# Patient Record
Sex: Female | Born: 1970 | Race: Black or African American | Hispanic: No | Marital: Married | Smoking: Current every day smoker
Health system: Southern US, Community
[De-identification: ages and names within clinical notes are randomized; demographics above are authoritative.]

## PROBLEM LIST (undated history)

## (undated) DIAGNOSIS — M51369 Other intervertebral disc degeneration, lumbar region without mention of lumbar back pain or lower extremity pain: Secondary | ICD-10-CM

## (undated) DIAGNOSIS — G459 Transient cerebral ischemic attack, unspecified: Secondary | ICD-10-CM

## (undated) DIAGNOSIS — R55 Syncope and collapse: Secondary | ICD-10-CM

## (undated) DIAGNOSIS — R32 Unspecified urinary incontinence: Secondary | ICD-10-CM

## (undated) DIAGNOSIS — N3281 Overactive bladder: Secondary | ICD-10-CM

## (undated) DIAGNOSIS — F4310 Post-traumatic stress disorder, unspecified: Secondary | ICD-10-CM

## (undated) DIAGNOSIS — F419 Anxiety disorder, unspecified: Secondary | ICD-10-CM

## (undated) DIAGNOSIS — F1910 Other psychoactive substance abuse, uncomplicated: Secondary | ICD-10-CM

## (undated) DIAGNOSIS — M1990 Unspecified osteoarthritis, unspecified site: Secondary | ICD-10-CM

## (undated) DIAGNOSIS — D259 Leiomyoma of uterus, unspecified: Secondary | ICD-10-CM

## (undated) DIAGNOSIS — I10 Essential (primary) hypertension: Secondary | ICD-10-CM

## (undated) DIAGNOSIS — R9431 Abnormal electrocardiogram [ECG] [EKG]: Secondary | ICD-10-CM

## (undated) DIAGNOSIS — I219 Acute myocardial infarction, unspecified: Secondary | ICD-10-CM

## (undated) DIAGNOSIS — I6381 Other cerebral infarction due to occlusion or stenosis of small artery: Secondary | ICD-10-CM

## (undated) DIAGNOSIS — R569 Unspecified convulsions: Secondary | ICD-10-CM

## (undated) DIAGNOSIS — Z5189 Encounter for other specified aftercare: Secondary | ICD-10-CM

## (undated) DIAGNOSIS — F329 Major depressive disorder, single episode, unspecified: Secondary | ICD-10-CM

## (undated) DIAGNOSIS — R7303 Prediabetes: Secondary | ICD-10-CM

## (undated) DIAGNOSIS — Z7289 Other problems related to lifestyle: Secondary | ICD-10-CM

## (undated) DIAGNOSIS — E785 Hyperlipidemia, unspecified: Secondary | ICD-10-CM

## (undated) DIAGNOSIS — F32A Depression, unspecified: Secondary | ICD-10-CM

## (undated) DIAGNOSIS — R102 Pelvic and perineal pain: Secondary | ICD-10-CM

## (undated) DIAGNOSIS — R531 Weakness: Secondary | ICD-10-CM

## (undated) DIAGNOSIS — R413 Other amnesia: Secondary | ICD-10-CM

## (undated) DIAGNOSIS — L209 Atopic dermatitis, unspecified: Secondary | ICD-10-CM

## (undated) DIAGNOSIS — Z720 Tobacco use: Secondary | ICD-10-CM

## (undated) DIAGNOSIS — E119 Type 2 diabetes mellitus without complications: Secondary | ICD-10-CM

## (undated) DIAGNOSIS — I639 Cerebral infarction, unspecified: Secondary | ICD-10-CM

## (undated) HISTORY — DX: Overactive bladder: N32.81

## (undated) HISTORY — DX: Post-traumatic stress disorder, unspecified: F43.10

## (undated) HISTORY — DX: Leiomyoma of uterus, unspecified: D25.9

## (undated) HISTORY — DX: Atopic dermatitis, unspecified: L20.9

## (undated) HISTORY — DX: Weakness: R53.1

## (undated) HISTORY — DX: Tobacco use: Z72.0

## (undated) HISTORY — DX: Abnormal electrocardiogram (ECG) (EKG): R94.31

## (undated) HISTORY — DX: Other problems related to lifestyle: Z72.89

## (undated) HISTORY — DX: Other cerebral infarction due to occlusion or stenosis of small artery: I63.81

## (undated) HISTORY — DX: Pelvic and perineal pain: R10.2

## (undated) HISTORY — DX: Hyperlipidemia, unspecified: E78.5

## (undated) HISTORY — DX: Encounter for other specified aftercare: Z51.89

## (undated) HISTORY — DX: Anxiety disorder, unspecified: F41.9

## (undated) HISTORY — DX: Unspecified convulsions: R56.9

## (undated) HISTORY — DX: Unspecified urinary incontinence: R32

## (undated) HISTORY — DX: Major depressive disorder, single episode, unspecified: F32.9

## (undated) HISTORY — DX: Prediabetes: R73.03

## (undated) HISTORY — DX: Other intervertebral disc degeneration, lumbar region without mention of lumbar back pain or lower extremity pain: M51.369

## (undated) HISTORY — DX: Other psychoactive substance abuse, uncomplicated: F19.10

## (undated) HISTORY — DX: Syncope and collapse: R55

## (undated) HISTORY — DX: Depression, unspecified: F32.A

---

## 2014-11-22 ENCOUNTER — Emergency Department (HOSPITAL_COMMUNITY): Payer: Medicaid Other

## 2014-11-22 ENCOUNTER — Encounter (HOSPITAL_COMMUNITY): Payer: Self-pay | Admitting: Emergency Medicine

## 2014-11-22 ENCOUNTER — Emergency Department (HOSPITAL_COMMUNITY)
Admission: EM | Admit: 2014-11-22 | Discharge: 2014-11-22 | Disposition: A | Discharge status: Home / Self Care | Payer: Medicaid Other | Attending: Physician Assistant | Admitting: Physician Assistant

## 2014-11-22 DIAGNOSIS — R1031 Right lower quadrant pain: Secondary | ICD-10-CM | POA: Diagnosis present

## 2014-11-22 DIAGNOSIS — R1021 Pelvic and perineal pain right side: Secondary | ICD-10-CM

## 2014-11-22 DIAGNOSIS — Z79899 Other long term (current) drug therapy: Secondary | ICD-10-CM | POA: Insufficient documentation

## 2014-11-22 DIAGNOSIS — I10 Essential (primary) hypertension: Secondary | ICD-10-CM | POA: Insufficient documentation

## 2014-11-22 DIAGNOSIS — R102 Pelvic and perineal pain: Secondary | ICD-10-CM | POA: Insufficient documentation

## 2014-11-22 DIAGNOSIS — Z3202 Encounter for pregnancy test, result negative: Secondary | ICD-10-CM | POA: Insufficient documentation

## 2014-11-22 DIAGNOSIS — R110 Nausea: Secondary | ICD-10-CM | POA: Insufficient documentation

## 2014-11-22 HISTORY — DX: Essential (primary) hypertension: I10

## 2014-11-22 LAB — COMPREHENSIVE METABOLIC PANEL
ALT: 24 U/L (ref 14–54)
AST: 27 U/L (ref 15–41)
Albumin: 4 g/dL (ref 3.5–5.0)
Alkaline Phosphatase: 68 U/L (ref 38–126)
Anion gap: 8 (ref 5–15)
BUN: 8 mg/dL (ref 6–20)
CHLORIDE: 105 mmol/L (ref 101–111)
CO2: 23 mmol/L (ref 22–32)
CREATININE: 0.83 mg/dL (ref 0.44–1.00)
Calcium: 9.3 mg/dL (ref 8.9–10.3)
GFR calc Af Amer: >60 mL/min (ref >60)
GLUCOSE: 105 mg/dL — AB (ref 65–99)
Potassium: 3.8 mmol/L (ref 3.5–5.1)
Sodium: 136 mmol/L (ref 135–145)
Total Bilirubin: 0.5 mg/dL (ref 0.3–1.2)
Total Protein: 7.5 g/dL (ref 6.5–8.1)

## 2014-11-22 LAB — WET PREP, GENITAL
Trich, Wet Prep: NONE SEEN
Yeast Wet Prep HPF POC: NONE SEEN

## 2014-11-22 LAB — CBC
HCT: 43 % (ref 36.0–46.0)
Hemoglobin: 14.6 g/dL (ref 12.0–15.0)
MCH: 30.9 pg (ref 26.0–34.0)
MCHC: 34 g/dL (ref 30.0–36.0)
MCV: 91.1 fL (ref 78.0–100.0)
PLATELETS: 273 10*3/uL (ref 150–400)
RBC: 4.72 MIL/uL (ref 3.87–5.11)
RDW: 13.5 % (ref 11.5–15.5)
WBC: 9.7 10*3/uL (ref 4.0–10.5)

## 2014-11-22 LAB — URINALYSIS, ROUTINE W REFLEX MICROSCOPIC
BILIRUBIN URINE: NEGATIVE
GLUCOSE, UA: NEGATIVE mg/dL
HGB URINE DIPSTICK: NEGATIVE
KETONES UR: NEGATIVE mg/dL
Leukocytes, UA: NEGATIVE
NITRITE: NEGATIVE
PH: 6 (ref 5.0–8.0)
Protein, ur: NEGATIVE mg/dL
Specific Gravity, Urine: 1.01 (ref 1.005–1.030)
Urobilinogen, UA: 0.2 mg/dL (ref 0.0–1.0)

## 2014-11-22 LAB — GC/CHLAMYDIA PROBE AMP (~~LOC~~) NOT AT ARMC
CHLAMYDIA, DNA PROBE: NEGATIVE
Neisseria Gonorrhea: NEGATIVE

## 2014-11-22 LAB — LIPASE, BLOOD: LIPASE: 39 U/L (ref 11–51)

## 2014-11-22 LAB — I-STAT BETA HCG BLOOD, ED (MC, WL, AP ONLY): I-stat hCG, quantitative: <5 m[IU]/mL (ref <5)

## 2014-11-22 LAB — HIV ANTIBODY (ROUTINE TESTING W REFLEX): HIV SCREEN 4TH GENERATION: NONREACTIVE

## 2014-11-22 MED ORDER — KETOROLAC TROMETHAMINE 30 MG/ML IJ SOLN
30.0000 mg | Freq: Once | INTRAMUSCULAR | Status: AC
  Administered 2014-11-22: 30 mg via INTRAVENOUS
  Filled 2014-11-22: qty 1

## 2014-11-22 MED ORDER — IBUPROFEN 800 MG PO TABS: 800.0000 mg | ORAL_TABLET | Freq: Three times a day (TID) | ORAL | Status: DC

## 2014-11-22 NOTE — ED Notes (Signed)
Pt aware of need for a urine sample. Female urinal provided.

## 2014-11-22 NOTE — ED Notes (Signed)
Pt transported to [place] will medicate patient's pain upon arrival back to department.

## 2014-11-22 NOTE — ED Provider Notes (Signed)
CSN: [id number]     Arrival date & time 11/22/14  0115 History   By signing my name below, I, Evelene Croon, attest that this documentation has been prepared under the direction and in the presence of Morene Cecilio Julio Alm, MD . Electronically Signed: Evelene Croon, Scribe. 11/22/2014. 1:50 AM.   Chief Complaint  Patient presents with  . Abdominal Pain   The history is provided by the patient. No language interpreter was used.    HPI Comments:  Denise [name] is a 44 y.o. female who presents to the Emergency Department complaining of moderate right lower quadrant abdominal pain for a few months (since ~ July 2016) that has recently worsened. She reports associated nausea. Pt denies vomiting, diarrhea, constipation, vaginal discharge and fever.  No alleviating factors noted. Her LNMP was the second week of September 2016; notes her period has been irregular for the last year.  Past Medical History  Diagnosis Date  . Hypertension    History reviewed. No pertinent past surgical history. No family history on file. Social History  Substance Use Topics  . Smoking status: Never Smoker   . Smokeless tobacco: None  . Alcohol Use: No   OB History    No data available     Review of Systems  Constitutional: Negative for fever and chills.  Respiratory: Negative for cough.   Gastrointestinal: Positive for nausea and abdominal pain. Negative for vomiting.  All other systems reviewed and are negative.  Allergies  Review of patient's allergies indicates no known allergies.  Home Medications   Prior to Admission medications   Medication Sig Start Date End Date Taking? Authorizing Provider  Dextromethorphan HBr (VICKS DAYQUIL COUGH) 15 MG/15ML LIQD Take 15 mLs by mouth every 6 (six) hours as needed (cough).   Yes Historical Provider, MD  ibuprofen (ADVIL,MOTRIN) 200 MG tablet Take 200 mg by mouth every 6 (six) hours as needed for moderate pain.   Yes Historical Provider, MD   lisinopril (PRINIVIL,ZESTRIL) 20 MG tablet Take 20 mg by mouth 2 (two) times daily.   Yes Historical Provider, MD  SIMVASTATIN PO Take 1 tablet by mouth daily.   Yes Historical Provider, MD   BP 163/95 mmHg  Pulse 65  Temp(Src) 97.4 F (36.3 C) (Oral)  Resp 20  SpO2 99%  LMP 10/09/2014 Physical Exam  Constitutional: She is oriented to person, place, and time. She appears well-developed and well-nourished. No distress.  HENT:  Head: Normocephalic and atraumatic.  Eyes: Conjunctivae are normal.  Cardiovascular: Normal rate.   Pulmonary/Chest: Effort normal.  Abdominal: She exhibits no distension.  Genitourinary:  Chaperone Investment banker, corporate) was present for exam which was performed with no discomfort or complications.    Neurological: She is alert and oriented to person, place, and time.  Skin: Skin is warm and dry.  Psychiatric: She has a normal mood and affect.  Nursing note and vitals reviewed.   ED Course  Procedures  DIAGNOSTIC STUDIES:  Oxygen Saturation is 98% on RA, normal by my interpretation.    COORDINATION OF CARE:  1:36 AM Will order pain meds, pelvi [place] and bloodwork.  Discussed treatment plan with pt at bedside and pt agreed to plan.  Labs Review Labs Reviewed  WET PREP, GENITAL - Abnormal; Notable for the following:    Clue Cells Wet Prep HPF POC FEW (*)    WBC, Wet Prep HPF POC FEW (*)    All other components within normal limits  COMPREHENSIVE METABOLIC PANEL - Abnormal; Notable for the following:  Glucose, Bld 105 (*)    All other components within normal limits  URINALYSIS, ROUTINE W REFLEX MICROSCOPIC (NOT AT [facility]) - Abnormal; Notable for the following:    APPearance CLOUDY (*)    All other components within normal limits  LIPASE, BLOOD  CBC  RPR  HIV ANTIBODY (ROUTINE TESTING)  I-STAT BETA HCG BLOOD, ED ([facility], [facility], [facility] ONLY)  GC/CHLAMYDIA PROBE AMP ([place]) NOT AT [facility]    Imaging Review US Transvaginal Non-ob  11/22/2014  CLINICAL DATA:  RIGHT  adnexal tenderness since July 8. EXAM: TRANSABDOMINAL AND TRANSVAGINAL ULTRASOUND OF PELVIS TECHNIQUE: Both transabdominal and transvaginal ultrasound examinations of the pelvis were performed. Transabdominal technique was performed for global imaging of the pelvis including uterus, ovaries, adnexal regions, and pelvic cul-de-sac. It was necessary to proceed with endovaginal exam following the transabdominal exam to visualize the ovaries. COMPARISON:  None FINDINGS: Limited evaluation, due to positioning of the uterus. Uterus Measurements: Or 8.6 x 8.6 x 5.1 cm. Exophytic 4.7 x 5.4 x 4.3 cm hypoechoic uterine fundal leiomyoma. Additional calcified 3.2 x 2.2 x 2.2 cm exophytic leiomyoma arising from the uterine fundus. Endometrium Thickness: 8.5 mm.  No focal abnormality visualized. Right ovary Not sonographically identified Left ovary Measurements: 2 x 1.5 x 1.1 cm. Normal appearance/no adnexal mass. Other findings Trace free fluid in the pelvis. IMPRESSION: Limited pelvic sonogram due to positioning of uterus, RIGHT ovary not sonographically identified. Two subserosal uterine fundal leiomyomas measure up to 5.4 cm still be better characterized with MRI of the pelvis on a nonemergent basis as clinically indicated. Electronically Signed   By: Elon Alas M.D.   On: 11/22/2014 02:51   US Pelvis Complete  11/22/2014  CLINICAL DATA:  RIGHT adnexal tenderness since July 8. EXAM: TRANSABDOMINAL AND TRANSVAGINAL ULTRASOUND OF PELVIS TECHNIQUE: Both transabdominal and transvaginal ultrasound examinations of the pelvis were performed. Transabdominal technique was performed for global imaging of the pelvis including uterus, ovaries, adnexal regions, and pelvic cul-de-sac. It was necessary to proceed with endovaginal exam following the transabdominal exam to visualize the ovaries. COMPARISON:  None FINDINGS: Limited evaluation, due to positioning of the uterus. Uterus Measurements: Or 8.6 x 8.6 x 5.1 cm. Exophytic  4.7 x 5.4 x 4.3 cm hypoechoic uterine fundal leiomyoma. Additional calcified 3.2 x 2.2 x 2.2 cm exophytic leiomyoma arising from the uterine fundus. Endometrium Thickness: 8.5 mm.  No focal abnormality visualized. Right ovary Not sonographically identified Left ovary Measurements: 2 x 1.5 x 1.1 cm. Normal appearance/no adnexal mass. Other findings Trace free fluid in the pelvis. IMPRESSION: Limited pelvic sonogram due to positioning of uterus, RIGHT ovary not sonographically identified. Two subserosal uterine fundal leiomyomas measure up to 5.4 cm still be better characterized with MRI of the pelvis on a nonemergent basis as clinically indicated. Electronically Signed   By: Elon Alas M.D.   On: 11/22/2014 02:51   I have personally reviewed and evaluated these images and lab results as part of my medical decision-making.   EKG Interpretation None      MDM   Final diagnoses:  Right adnexal tenderness    Patient is a very pleasant 44 year old morbidly obese female stenting with right lower quadrant pain/adnexal pain for the past few months. Patient unable to tell a good history about what makes it better or worse. Patient's been having irregular periods. She reportedly was going to get somefurther imaging done by her PCP but has not been scheduled for yet. Patient noted  that the pain became acutely worse  this evening. SHe appears in NAD on exam. No fever. No nausea no vomiting no diarrhea.  Given the adnexal lcoation of the pain in this patient. We will do vaginal exam and transvaginal ultrasound. Concern for t cyst versus dyfunctional uterine bleeding.   I personally performed the services described in this documentation, which was scribed in my presence. The recorded information has been reviewed and is accurate.   3:26 AM Vaginal exam normal. No cerclage tenderness. No discharge. Ultrasound shows fibroids. Patient's pain is resolved. Given the length of time the pain has been going  on, several months, I doubt any acute pathology such as torsion. I think that fibroid could be the answer to why the patient is having multiple months of pain.  We'll give her ibuprofen and have her follow-up with her primary care physician and obgyn.  Debarah Mccumbers Julio Alm, MD 11/22/14 [phone]

## 2014-11-22 NOTE — ED Notes (Addendum)
Pt BIB [facility] c/o RLQ abdominal pain worse with palpation. Has seen PCP for same but was never called to confirm testing appointment. Denies urinary symptoms or nausea, vomiting, or diarrhea.

## 2014-11-22 NOTE — ED Notes (Signed)
Bed: WA09 Expected date:  Expected time:  Means of arrival:  Comments: EMS F Rt lower quadrant pain

## 2014-11-22 NOTE — ED Notes (Signed)
MD at bedside. 

## 2014-11-22 NOTE — Discharge Instructions (Signed)
You are found to have fibroids today. We think that's what causing your months of pain. If you have any increase in pain, sharp pain, fever, or any other concerns please return. We will continue a referral for OB/GYN please feel free to follow up with them.

## 2014-11-26 LAB — RPR, QUANT+TP ABS (REFLEX)
Rapid Plasma Reagin, Quant: 1:2 {titer} — ABNORMAL HIGH
TREPONEMA PALLIDUM AB: POSITIVE — AB

## 2014-11-26 LAB — RPR: RPR Ser Ql: REACTIVE — AB

## 2014-11-27 ENCOUNTER — Telehealth (HOSPITAL_BASED_OUTPATIENT_CLINIC_OR_DEPARTMENT_OTHER): Payer: Self-pay | Admitting: Emergency Medicine

## 2014-11-27 NOTE — Telephone Encounter (Signed)
Chart handoff to EDP for treatnment plan +RPR

## 2014-11-30 ENCOUNTER — Telehealth (HOSPITAL_COMMUNITY): Payer: Self-pay

## 2014-12-01 ENCOUNTER — Telehealth (HOSPITAL_COMMUNITY): Payer: Self-pay

## 2014-12-02 ENCOUNTER — Telehealth (HOSPITAL_COMMUNITY): Payer: Self-pay

## 2014-12-02 NOTE — Telephone Encounter (Signed)
Unable to reach by telephone. Letter sent to address on record.  

## 2014-12-18 ENCOUNTER — Telehealth (HOSPITAL_COMMUNITY): Payer: Self-pay

## 2014-12-18 ENCOUNTER — Encounter: Payer: Self-pay | Admitting: Obstetrics & Gynecology

## 2014-12-18 ENCOUNTER — Other Ambulatory Visit (HOSPITAL_COMMUNITY)
Admission: RE | Admit: 2014-12-18 | Discharge: 2014-12-18 | Disposition: A | Discharge status: Home / Self Care | Payer: Medicaid Other | Source: Ambulatory Visit | Attending: Obstetrics & Gynecology | Admitting: Obstetrics & Gynecology

## 2014-12-18 ENCOUNTER — Ambulatory Visit (INDEPENDENT_AMBULATORY_CARE_PROVIDER_SITE_OTHER): Payer: Medicaid Other | Admitting: Obstetrics & Gynecology

## 2014-12-18 VITALS — BP 184/98 | HR 91 | Temp 98.4°F | Ht 64.0 in | Wt 248.6 lb

## 2014-12-18 DIAGNOSIS — Z01419 Encounter for gynecological examination (general) (routine) without abnormal findings: Secondary | ICD-10-CM

## 2014-12-18 DIAGNOSIS — D259 Leiomyoma of uterus, unspecified: Secondary | ICD-10-CM

## 2014-12-18 DIAGNOSIS — Z1151 Encounter for screening for human papillomavirus (HPV): Secondary | ICD-10-CM | POA: Insufficient documentation

## 2014-12-18 DIAGNOSIS — R102 Pelvic and perineal pain: Secondary | ICD-10-CM

## 2014-12-18 DIAGNOSIS — Z01411 Encounter for gynecological examination (general) (routine) with abnormal findings: Secondary | ICD-10-CM | POA: Diagnosis present

## 2014-12-18 DIAGNOSIS — I10 Essential (primary) hypertension: Secondary | ICD-10-CM

## 2014-12-18 DIAGNOSIS — Z124 Encounter for screening for malignant neoplasm of cervix: Secondary | ICD-10-CM

## 2014-12-18 HISTORY — DX: Leiomyoma of uterus, unspecified: D25.9

## 2014-12-18 HISTORY — DX: Pelvic and perineal pain: R10.2

## 2014-12-18 HISTORY — DX: Essential (primary) hypertension: I10

## 2014-12-18 MED ORDER — TRAMADOL-ACETAMINOPHEN 37.5-325 MG PO TABS: 1.0000 | ORAL_TABLET | Freq: Four times a day (QID) | ORAL | Status: DC | PRN

## 2014-12-18 NOTE — Patient Instructions (Signed)

## 2014-12-18 NOTE — Telephone Encounter (Signed)
Unable to reach by phone or mail.  Chart closed.   

## 2014-12-18 NOTE — Progress Notes (Signed)
Patient ID: Denise [name], female   DOB: 30-Aug-1970, 44 y.o.   MRN: [id number]  Chief Complaint  Patient presents with  . Fibroids  referred for pelvic pain. Denise [name] showed fibroids  HPI Denise [name] is a 44 y.o. female.  G3P3 Patient's last menstrual period was 12/08/2014 (exact date). Menses every 2 months and last for 3 days, not heavy. Daily abd and pelvic pain with urinary urge, frequency and pain.  HPI  Past Medical History  Diagnosis Date  . Hypertension     No past surgical history on file.  No family history on file.  Social History Social History  Substance Use Topics  . Smoking status: Current Every Day Smoker  . Smokeless tobacco: Never Used  . Alcohol Use: No    No Known Allergies  Current Outpatient Prescriptions  Medication Sig Dispense Refill  . Dextromethorphan HBr (VICKS DAYQUIL COUGH) 15 MG/15ML LIQD Take 15 mLs by mouth every 6 (six) hours as needed (cough).    Marland Kitchen ibuprofen (ADVIL,MOTRIN) 200 MG tablet Take 200 mg by mouth every 6 (six) hours as needed for moderate pain.    Marland Kitchen ibuprofen (ADVIL,MOTRIN) 800 MG tablet Take 1 tablet (800 mg total) by mouth 3 (three) times daily. 21 tablet 0  . lisinopril (PRINIVIL,ZESTRIL) 20 MG tablet Take 20 mg by mouth 2 (two) times daily.    Marland Kitchen SIMVASTATIN PO Take 1 tablet by mouth daily.     No current facility-administered medications for this visit.    Review of Systems Review of Systems  Constitutional: Negative.   Gastrointestinal: Positive for abdominal distention (and gas). Negative for diarrhea and constipation.  Genitourinary: Positive for dysuria, urgency, frequency, menstrual problem (mild cramps) and pelvic pain. Negative for hematuria, vaginal bleeding and vaginal discharge.  Musculoskeletal: Negative.     Blood pressure 184/98, pulse 91, temperature 98.4 F (36.9 C), height 5\' 4"  (1.626 m), weight 248 lb 9.6 oz (112.764 kg), last menstrual period 12/08/2014.  Physical Exam Physical  Exam  Constitutional: She is oriented to person, place, and time. She appears well-developed. No distress.  obese  Cardiovascular: Normal rate.   Pulmonary/Chest: Effort normal. No respiratory distress.  Genitourinary: Vagina normal and uterus normal. No vaginal discharge found.  No mass palpated, pap done. S/P tenderness  Neurological: She is alert and oriented to person, place, and time.  Skin: Skin is warm and dry.  Psychiatric: She has a normal mood and affect. Her behavior is normal.    Data Reviewed CLINICAL DATA: RIGHT adnexal tenderness since July 8.  EXAM: TRANSABDOMINAL AND TRANSVAGINAL ULTRASOUND OF PELVIS  TECHNIQUE: Both transabdominal and transvaginal ultrasound examinations of the pelvis were performed. Transabdominal technique was performed for global imaging of the pelvis including uterus, ovaries, adnexal regions, and pelvic cul-de-sac. It was necessary to proceed with endovaginal exam following the transabdominal exam to visualize the ovaries.  COMPARISON: None  FINDINGS: Limited evaluation, due to positioning of the uterus.  Uterus  Measurements: Or 8.6 x 8.6 x 5.1 cm. Exophytic 4.7 x 5.4 x 4.3 cm hypoechoic uterine fundal leiomyoma. Additional calcified 3.2 x 2.2 x 2.2 cm exophytic leiomyoma arising from the uterine fundus.  Endometrium  Thickness: 8.5 mm. No focal abnormality visualized.  Right ovary  Not sonographically identified  Left ovary  Measurements: 2 x 1.5 x 1.1 cm. Normal appearance/no adnexal mass.  Other findings  Trace free fluid in the pelvis.  IMPRESSION: Limited pelvic sonogram due to positioning of uterus, RIGHT ovary not sonographically identified.  Two subserosal uterine fundal  leiomyomas measure up to 5.4 cm still be better characterized with MRI of the pelvis on a nonemergent basis as clinically indicated.   Electronically Signed  By: Elon Alas M.D.  On: 11/22/2014 02:51       Result History     Denise [name] Transvaginal Non-OB (Order [id number]) on 11/22/2014 - Order Result History Report          Vitals     Height Weight BMI (Calculated)            Interpretation Summary     CLINICAL DATA: RIGHT adnexal tenderness since July 8.  EXAM: TRANSABDOMINAL AND TRANSVAGINAL ULTRASOUND OF PELVIS  TECHNIQUE: Both transabdominal and transvaginal ultrasound examinations of the pelvis were performed. Transabdominal technique was performed for global imaging of the pelvis including uterus, ovaries, adnexal regions, and pelvic cul-de-sac. It was necessary to proceed with endovaginal exam following the transabdominal exam to visualize the ovaries.  COMPARISON: None  FINDINGS: Limited evaluation, due to positioning of the uterus.  Uterus  Measurements: Or 8.6 x 8.6 x 5.1 cm. Exophytic 4.7 x 5.4 x 4.3 cm hypoechoic uterine fundal leiomyoma. Additional calcified 3.2 x 2.2 x 2.2 cm exophytic leiomyoma arising from the uterine fundus.  Endometrium  Thickness: 8.5 mm. No focal abnormality visualized.  Right ovary  Not sonographically identified  Left ovary  Measurements: 2 x 1.5 x 1.1 cm. Normal appearance/no adnexal mass.  Other findings  Trace free fluid in the pelvis.  IMPRESSION: Limited pelvic sonogram due to positioning of uterus, RIGHT ovary not sonographically identified.  Two subserosal uterine fundal leiomyomas measure up to 5.4 cm still be better characterized with MRI of the pelvis on a nonemergent basis as clinically indicated.   Electronically Signed  By: Elon Alas M.D.  On: 11/22/2014 02:51      Assessment    Exophytic fibroids, no significant menstrual problems Pelvic pain with associated GI and urinary symptoms2   Hypertension poor control  Plan    Ultracet prn no refill Urology referral for possible IC RTC 6 weeks    PCP f/u for HTN    Radley Teston 12/18/2014, 3:10  PM

## 2014-12-23 ENCOUNTER — Telehealth: Payer: Self-pay | Admitting: General Practice

## 2014-12-23 LAB — CYTOLOGY - PAP

## 2014-12-23 NOTE — Telephone Encounter (Signed)
Patient needed referral to urology. appt scheduled with [facility] 1/11 @ 230. Called patient and informed her of appt and [facility]'s information. Patient verbalized understanding to all & had no questions

## 2015-01-26 DIAGNOSIS — I639 Cerebral infarction, unspecified: Secondary | ICD-10-CM | POA: Insufficient documentation

## 2015-01-26 HISTORY — DX: Cerebral infarction, unspecified: I63.9

## 2015-02-06 ENCOUNTER — Encounter: Payer: Self-pay | Admitting: Gastroenterology

## 2015-02-07 ENCOUNTER — Observation Stay (HOSPITAL_COMMUNITY): Payer: Medicaid Other

## 2015-02-07 ENCOUNTER — Observation Stay (HOSPITAL_COMMUNITY)
Admission: EM | Admit: 2015-02-07 | Discharge: 2015-02-09 | Disposition: A | Discharge status: Home / Self Care | Payer: Medicaid Other | Attending: Student in an Organized Health Care Education/Training Program | Admitting: Student in an Organized Health Care Education/Training Program

## 2015-02-07 ENCOUNTER — Encounter (HOSPITAL_COMMUNITY): Payer: Self-pay | Admitting: Emergency Medicine

## 2015-02-07 ENCOUNTER — Emergency Department (HOSPITAL_COMMUNITY): Payer: Medicaid Other

## 2015-02-07 DIAGNOSIS — R350 Frequency of micturition: Secondary | ICD-10-CM | POA: Diagnosis not present

## 2015-02-07 DIAGNOSIS — R1110 Vomiting, unspecified: Secondary | ICD-10-CM | POA: Diagnosis not present

## 2015-02-07 DIAGNOSIS — R51 Headache: Secondary | ICD-10-CM | POA: Diagnosis not present

## 2015-02-07 DIAGNOSIS — G8191 Hemiplegia, unspecified affecting right dominant side: Secondary | ICD-10-CM | POA: Diagnosis not present

## 2015-02-07 DIAGNOSIS — R531 Weakness: Secondary | ICD-10-CM | POA: Insufficient documentation

## 2015-02-07 DIAGNOSIS — Z3202 Encounter for pregnancy test, result negative: Secondary | ICD-10-CM | POA: Diagnosis not present

## 2015-02-07 DIAGNOSIS — Z79899 Other long term (current) drug therapy: Secondary | ICD-10-CM | POA: Insufficient documentation

## 2015-02-07 DIAGNOSIS — F17210 Nicotine dependence, cigarettes, uncomplicated: Secondary | ICD-10-CM | POA: Diagnosis not present

## 2015-02-07 DIAGNOSIS — Z823 Family history of stroke: Secondary | ICD-10-CM | POA: Diagnosis not present

## 2015-02-07 DIAGNOSIS — R1030 Lower abdominal pain, unspecified: Secondary | ICD-10-CM | POA: Insufficient documentation

## 2015-02-07 DIAGNOSIS — I10 Essential (primary) hypertension: Secondary | ICD-10-CM | POA: Diagnosis not present

## 2015-02-07 DIAGNOSIS — Z86018 Personal history of other benign neoplasm: Secondary | ICD-10-CM | POA: Diagnosis not present

## 2015-02-07 DIAGNOSIS — I639 Cerebral infarction, unspecified: Secondary | ICD-10-CM | POA: Diagnosis not present

## 2015-02-07 DIAGNOSIS — R55 Syncope and collapse: Secondary | ICD-10-CM | POA: Diagnosis not present

## 2015-02-07 DIAGNOSIS — Z791 Long term (current) use of non-steroidal anti-inflammatories (NSAID): Secondary | ICD-10-CM | POA: Insufficient documentation

## 2015-02-07 DIAGNOSIS — A523 Neurosyphilis, unspecified: Secondary | ICD-10-CM

## 2015-02-07 HISTORY — DX: Syncope and collapse: R55

## 2015-02-07 LAB — URINALYSIS, ROUTINE W REFLEX MICROSCOPIC
Bilirubin Urine: NEGATIVE
Glucose, UA: NEGATIVE mg/dL
HGB URINE DIPSTICK: NEGATIVE
Ketones, ur: NEGATIVE mg/dL
LEUKOCYTES UA: NEGATIVE
Nitrite: NEGATIVE
Protein, ur: NEGATIVE mg/dL
SPECIFIC GRAVITY, URINE: 1.019 (ref 1.005–1.030)
pH: 5.5 (ref 5.0–8.0)

## 2015-02-07 LAB — BASIC METABOLIC PANEL
ANION GAP: 11 (ref 5–15)
BUN: <5 mg/dL — ABNORMAL LOW (ref 6–20)
CALCIUM: 9.3 mg/dL (ref 8.9–10.3)
CO2: 22 mmol/L (ref 22–32)
Chloride: 105 mmol/L (ref 101–111)
Creatinine, Ser: 0.73 mg/dL (ref 0.44–1.00)
GLUCOSE: 106 mg/dL — AB (ref 65–99)
Potassium: 4 mmol/L (ref 3.5–5.1)
SODIUM: 138 mmol/L (ref 135–145)

## 2015-02-07 LAB — CBC
HCT: 44 % (ref 36.0–46.0)
HEMOGLOBIN: 15 g/dL (ref 12.0–15.0)
MCH: 31.2 pg (ref 26.0–34.0)
MCHC: 34.1 g/dL (ref 30.0–36.0)
MCV: 91.5 fL (ref 78.0–100.0)
Platelets: 300 10*3/uL (ref 150–400)
RBC: 4.81 MIL/uL (ref 3.87–5.11)
RDW: 14.3 % (ref 11.5–15.5)
WBC: 9.8 10*3/uL (ref 4.0–10.5)

## 2015-02-07 LAB — TROPONIN I
Troponin I: <0.03 ng/mL (ref <0.031)
Troponin I: <0.03 ng/mL (ref <0.031)

## 2015-02-07 LAB — CBG MONITORING, ED: Glucose-Capillary: 100 mg/dL — ABNORMAL HIGH (ref 65–99)

## 2015-02-07 LAB — RAPID URINE DRUG SCREEN, HOSP PERFORMED
Amphetamines: NOT DETECTED
BENZODIAZEPINES: NOT DETECTED
Barbiturates: NOT DETECTED
Cocaine: POSITIVE — AB
Opiates: NOT DETECTED
Tetrahydrocannabinol: NOT DETECTED

## 2015-02-07 LAB — TSH: TSH: 2.587 u[IU]/mL (ref 0.350–4.500)

## 2015-02-07 LAB — I-STAT BETA HCG BLOOD, ED (MC, WL, AP ONLY): I-stat hCG, quantitative: <5 m[IU]/mL (ref <5)

## 2015-02-07 LAB — ETHANOL

## 2015-02-07 MED ORDER — METOCLOPRAMIDE HCL 5 MG/ML IJ SOLN
10.0000 mg | Freq: Once | INTRAMUSCULAR | Status: AC
  Administered 2015-02-07: 10 mg via INTRAVENOUS
  Filled 2015-02-07: qty 2

## 2015-02-07 MED ORDER — HEPARIN SODIUM (PORCINE) 5000 UNIT/ML IJ SOLN
5000.0000 [IU] | Freq: Three times a day (TID) | INTRAMUSCULAR | Status: DC
  Administered 2015-02-07 – 2015-02-08 (×2): 5000 [IU] via SUBCUTANEOUS
  Filled 2015-02-07 (×2): qty 1

## 2015-02-07 MED ORDER — VITAMIN B-1 100 MG PO TABS
100.0000 mg | ORAL_TABLET | Freq: Every day | ORAL | Status: DC
  Administered 2015-02-07 – 2015-02-09 (×3): 100 mg via ORAL
  Filled 2015-02-07 (×3): qty 1

## 2015-02-07 MED ORDER — LORAZEPAM 1 MG PO TABS: 1.0000 mg | ORAL_TABLET | Freq: Four times a day (QID) | ORAL | Status: DC | PRN

## 2015-02-07 MED ORDER — LORAZEPAM 2 MG/ML IJ SOLN: 1.0000 mg | Freq: Four times a day (QID) | INTRAMUSCULAR | Status: DC | PRN

## 2015-02-07 MED ORDER — ASPIRIN EC 325 MG PO TBEC
325.0000 mg | DELAYED_RELEASE_TABLET | Freq: Once | ORAL | Status: AC
  Administered 2015-02-07: 325 mg via ORAL
  Filled 2015-02-07: qty 1

## 2015-02-07 MED ORDER — THIAMINE HCL 100 MG/ML IJ SOLN: 100.0000 mg | Freq: Every day | INTRAMUSCULAR | Status: DC

## 2015-02-07 MED ORDER — OXYCODONE-ACETAMINOPHEN 5-325 MG PO TABS
2.0000 | ORAL_TABLET | Freq: Once | ORAL | Status: AC
  Administered 2015-02-07: 2 via ORAL
  Filled 2015-02-07: qty 2

## 2015-02-07 MED ORDER — ADULT MULTIVITAMIN W/MINERALS CH
1.0000 | ORAL_TABLET | Freq: Every day | ORAL | Status: DC
  Administered 2015-02-07 – 2015-02-09 (×3): 1 via ORAL
  Filled 2015-02-07 (×3): qty 1

## 2015-02-07 MED ORDER — FOLIC ACID 1 MG PO TABS
1.0000 mg | ORAL_TABLET | Freq: Every day | ORAL | Status: DC
  Administered 2015-02-07 – 2015-02-09 (×3): 1 mg via ORAL
  Filled 2015-02-07 (×3): qty 1

## 2015-02-07 MED ORDER — LISINOPRIL 20 MG PO TABS: 20.0000 mg | ORAL_TABLET | Freq: Two times a day (BID) | ORAL | Status: DC

## 2015-02-07 NOTE — ED Provider Notes (Signed)
CSN: [id number]     Arrival date & time 02/07/15  1329 History   First MD Initiated Contact with Patient 02/07/15 1331     Chief Complaint  Patient presents with  . Loss of Consciousness   (Consider location/radiation/quality/duration/timing/severity/associated sxs/prior Treatment) The history is provided by the patient, the spouse and a friend. No language interpreter was used.   Denise [name] is a 45 y.o female with a past medical history of hypertension who presents from home via EMS for syncopal episode that occurred prior to arrival and lasted less than a minute. Her husband reports that he was sleeping on the couch of the time and heard her scream out to him before she passed out. He does not know if she hit her head on the wooden floor. She is complaining of a headache and abdominal pain which her husband states is chronic for her and that she has been suffering from for several years. She says the headache is in the parietal and occipital region and is reproducible. She also reports that it occurs every morning. She also had one episode of vomiting without blood 1 week ago. She was recently seen women's outpatient clinic and diagnosed with uterine fibroids. She was given a referral to the gastroenterologist for abdominal pain. She denies taking any medications except for lisinopril 20mg  BID for her blood pressure. Husband reports that she passes out all the time but has never been evaluated for headaches or seen by a neurologist. She denies any fever, chills, chest pain, shortness of breath, nausea, diarrhea, constipation, hematochezia. Her last bowel movement was this morning. Last menstrual period was 12/08/2014.  Past Medical History  Diagnosis Date  . Hypertension    Past Surgical History  Procedure Laterality Date  . Cesarean section     No family history on file. Social History  Substance Use Topics  . Smoking status: Current Every Day Smoker  . Smokeless tobacco: Never  Used  . Alcohol Use: 0.0 oz/week    0 Standard drinks or equivalent per week   OB History    Gravida Para Term Preterm AB TAB SAB Ectopic Multiple Living   3 3        3      Review of Systems  Constitutional: Negative for fever and chills.  Respiratory: Negative for shortness of breath.   Gastrointestinal: Positive for vomiting and abdominal pain. Negative for nausea, diarrhea, constipation and blood in stool.  Genitourinary: Positive for frequency.  Neurological: Positive for headaches.  All other systems reviewed and are negative.     Allergies  Review of patient's allergies indicates no known allergies.  Home Medications   Prior to Admission medications   Medication Sig Start Date End Date Taking? Authorizing Provider  ibuprofen (ADVIL,MOTRIN) 800 MG tablet Take 1 tablet (800 mg total) by mouth 3 (three) times daily. 11/22/14  Yes Courteney Lyn Mackuen, MD  lisinopril (PRINIVIL,ZESTRIL) 20 MG tablet Take 20 mg by mouth 2 (two) times daily.   Yes Historical Provider, MD  naproxen sodium (ANAPROX) 220 MG tablet Take 220 mg by mouth 2 (two) times daily with a meal.   Yes Historical Provider, MD  traMADol-acetaminophen (ULTRACET) 37.5-325 MG tablet Take 1 tablet by mouth every 6 (six) hours as needed. 12/18/14   Woodroe Mode, MD   BP 174/95 mmHg  Pulse 88  Temp(Src) 98.3 F (36.8 C) (Oral)  Resp 18  Ht 5\' 4"  (1.626 m)  Wt 113.853 kg  BMI 43.06 kg/m2  SpO2 100%  LMP 12/08/2014 Physical Exam  Constitutional: She is oriented to person, place, and time. She appears well-developed and well-nourished. No distress.  HENT:  Head: Normocephalic. Head is without raccoon's eyes, without Battle's sign, without abrasion, without contusion and without laceration. Hair is normal.  Contusions, lacerations, battle signs, or raccoon eyes on exam.   Eyes: Conjunctivae are normal.  Neck:  Patient arrived in c-collar.  Cardiovascular: Normal rate, regular rhythm and normal heart sounds.    Regular rate and rhythm. No murmur.  Pulmonary/Chest: Effort normal and breath sounds normal.  Lungs clear to auscultation bilaterally.  Abdominal: Soft. There is no tenderness.    Obese. Suprapubic abdominal tenderness to palpation. No abdominal distention. No CVA tenderness.  Musculoskeletal: Normal range of motion. She exhibits no edema or tenderness.  2+ DP and radial pulses.  Neurological: She is alert and oriented to person, place, and time. She has normal strength. No cranial nerve deficit or sensory deficit. She displays a negative Romberg sign. GCS eye subscore is 4. GCS verbal subscore is 5. GCS motor subscore is 6.  [employer] of 15. Cranial nerves 3-12 in tact.  No sensory deficit.  No motor deficit.  Normal finger to nose bilaterally. Normal romberg.  Skin: Skin is warm and dry.  Nursing note and vitals reviewed.   ED Course  Procedures (including critical care time) Labs Review Labs Reviewed  BASIC METABOLIC PANEL - Abnormal; Notable for the following:    Glucose, Bld 106 (*)    BUN <5 (*)    All other components within normal limits  CBG MONITORING, ED - Abnormal; Notable for the following:    Glucose-Capillary 100 (*)    All other components within normal limits  CBC  URINALYSIS, ROUTINE W REFLEX MICROSCOPIC (NOT AT [facility])  I-STAT BETA HCG BLOOD, ED ([facility], [facility], [facility] ONLY)    Imaging Review Ct Head Wo Contrast  02/07/2015  CLINICAL DATA:  Seizure with syncopal episode.  Hypertension. EXAM: CT HEAD WITHOUT CONTRAST CT CERVICAL SPINE WITHOUT CONTRAST TECHNIQUE: Multidetector CT imaging of the head and cervical spine was performed following the standard protocol without intravenous contrast. Multiplanar CT image reconstructions of the cervical spine were also generated. COMPARISON:  None. FINDINGS: CT HEAD FINDINGS The ventricles are normal in size and configuration. There is mild invagination of CSF into the sella. There is no intracranial mass, hemorrhage, extra-axial fluid  collection, or midline shift. 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 This focal area of may represent a recent/acute infarct. Elsewhere, gray-white compartments are normal. The bony calvarium appears intact. The mastoid air cells are clear. Visualized orbital regions appear symmetric and unremarkable. CT CERVICAL SPINE FINDINGS There is no fracture or spondylolisthesis. Prevertebral soft tissues and predental space regions are normal. There is mild disc space narrowing at C5-6, C6-7, and C7-T1. There is no nerve root edema or effacement. No disc extrusion or stenosis. There is calcification in each carotid artery. IMPRESSION: CT head: Decreased attenuation in the anterior left basal ganglia, a finding suspicious for recent/acute infarct in this area. Elsewhere gray-white compartments appear normal. No hemorrhage or well-defined mass. Mild degree of empty sella. CT cervical spine: Mild osteoarthritic change. No fracture or spondylolisthesis. Foci of carotid artery calcification on both sides. Electronically Signed   By:  Lowella Grip III M.D.   On: 02/07/2015 14:35   Ct Cervical Spine Wo Contrast  02/07/2015  CLINICAL DATA:  Seizure with syncopal episode.  Hypertension. EXAM: CT HEAD WITHOUT CONTRAST CT CERVICAL SPINE WITHOUT CONTRAST TECHNIQUE: Multidetector CT imaging of the head and cervical spine was performed following the standard protocol without intravenous contrast. Multiplanar CT image reconstructions of the cervical spine were also generated. COMPARISON:  None. FINDINGS: CT HEAD FINDINGS The ventricles are normal in size and configuration. There is mild invagination of CSF into the sella. There is no intracranial mass, hemorrhage, extra-axial fluid collection, or midline  shift. 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 This focal area of may represent a recent/acute infarct. Elsewhere, gray-white compartments are normal. The bony calvarium appears intact. The mastoid air cells are clear. Visualized orbital regions appear symmetric and unremarkable. CT CERVICAL SPINE FINDINGS There is no fracture or spondylolisthesis. Prevertebral soft tissues and predental space regions are normal. There is mild disc space narrowing at C5-6, C6-7, and C7-T1. There is no nerve root edema or effacement. No disc extrusion or stenosis. There is calcification in each carotid artery. IMPRESSION: CT head: Decreased attenuation in the anterior left basal ganglia, a finding suspicious for recent/acute infarct in this area. Elsewhere gray-white compartments appear normal. No hemorrhage or well-defined mass. Mild degree of empty sella. CT cervical spine: Mild osteoarthritic change. No fracture or spondylolisthesis. Foci of carotid artery calcification on both sides. Electronically Signed   By: Lowella Grip III M.D.   On: 02/07/2015 14:35   I have personally reviewed and evaluated these images and lab results as part of my medical decision-making. ED ECG REPORT   Date: 02/07/2015  Rate: 73  Rhythm: normal sinus rhythm  QRS Axis: normal  Intervals: normal  ST/T Wave abnormalities: normal  Conduction Disutrbances:none  Narrative Interpretation:   Old EKG Reviewed: none available  I have personally reviewed the EKG tracing and agree with the computerized printout as noted.   MDM   Final diagnoses:  Syncope, unspecified syncope type  Stroke of unknown cause ([place])   10/28 Patient was seen in the ED by Dr. Ellie Lunch and diagnosed with uterine fibroids secondary to  abdominal pain. 11/23 Patient was seen by women's outpatient clinic and given Ultracet for uterine fibroids. She was also told to follow up with PCP for uncontrolled hypertension. She was given GI referral for chronic abdominal pain. She was also given urology follow-up due to urinary frequency.  Today patient comes in with syncopal episode. She had a headache and abdominal pain prior to passing out. Unknown if patient hit her head on the ground but husband found her slumped on the ground. Fall was unwitnessed. She is well-appearing but complaining of a headache and abdominal pain. She was headed to her PCP, Dr. Cranford Mon at 2:30 for an appointment to reevaluate her blood pressure medication due to her uncontrolled hypertension. I believe that her abdominal pain is related to her fibroids and is consistent with that location on exam. She had a normal neurological exam. C-collar removed using nexus criteria. No midline cervical tenderness. No acute C-spine fracture or spondylolisthesis.  Also noted, patient had a positive RPR from 3 months ago and states she was [facility].  I discussed this patient with the internal medicine physician unassigned regarding need for admission due to recent acute infarct on CT. Patient has been passing out frequently according to the husband. She is also been complaining of worsening headaches. Medications  oxyCODONE-acetaminophen (PERCOCET/ROXICET) 5-325 MG per tablet 2 tablet (2 tablets Oral Given 02/07/15 1502)   Filed Vitals:   02/07/15 1400 02/07/15 1457  BP: 175/115 174/95  Pulse: 88   Temp:    Resp: 25 [id number], PA-C 02/07/15 [place] Liu, MD 02/07/15 [phone]

## 2015-02-07 NOTE — Progress Notes (Signed)
Paged internal medicine on call to make aware of CT findings and that CT reported that patient vomited up contrast and refused any further testing. Also to confirm if patient should still receive heparin SQ as ordered since CT shows a chronic hemorraghic infarct. Await return call. [facility] [employer]

## 2015-02-07 NOTE — Consult Note (Signed)
Neurology Consultation Reason for Consult: Suspected stroke Referring Physician: Evette Doffing, D  CC: Right-sided weakness  History is obtained from: Patient, husband  HPI: Denise [name] is a 45 y.o. female with a history of hypertension who presents with right-sided weakness following an episode of "syncope." He described her as glassy eye and lasting 5-10 minutes. Her speech has since improved, however she has now had persistent right-sided weakness since that time.  CT scan was obtained in the emergency room which demonstrates hypodensity in the left frontal region which could be an area to cause her symptoms.  She has had several similar episodes, but has not had one in the past year.   LKW: Noon tpa given?: no, stroke not initially suspected.     ROS: A 14 point ROS was performed and is negative except as noted in the HPI.   Past Medical History  Diagnosis Date  . Hypertension      Family history:   Social History:  reports that she has been smoking.  She has never used smokeless tobacco. She reports that she drinks alcohol. Her drug history is not on file.   Exam: Current vital signs: BP 197/113 mmHg  Pulse 75  Temp(Src) 97.3 F (36.3 C) (Oral)  Resp 16  Ht 5\' 4"  (1.626 m)  Wt 112.401 kg (247 lb 12.8 oz)  BMI 42.51 kg/m2  SpO2 100%  LMP 12/08/2014 Vital signs in last 24 hours: Temp:  [97.3 F (36.3 C)-98.3 F (36.8 C)] 97.3 F (36.3 C) (01/13 1739) Pulse Rate:  [74-88] 75 (01/13 1739) Resp:  [14-26] 16 (01/13 1739) BP: (170-210)/(89-129) 197/113 mmHg (01/13 1739) SpO2:  [97 %-100 %] 100 % (01/13 1739) Weight:  [112.401 kg (247 lb 12.8 oz)-113.853 kg (251 lb)] 112.401 kg (247 lb 12.8 oz) (01/13 1744)   Physical Exam  Constitutional: Appears well-developed and well-nourished.  Psych: Affect appropriate to situation Eyes: No scleral injection HENT: No OP obstrucion Head: Normocephalic.  Cardiovascular: Normal rate and regular rhythm.   Respiratory: Effort normal and breath sounds normal to anterior ascultation GI: Soft.  No distension. There is no tenderness.  Skin: WDI  Neuro: Mental Status: Patient is awake, alert, oriented to person, place, month, year, and situation. Patient is able to give a clear and coherent history. No signs of aphasia or neglect Cranial Nerves: II: Visual Fields are full. Pupils are equal, round, and reactive to light.   III,IV, VI: EOMI without ptosis or diploplia.  V: Facial sensation is diminished on the right VII: Facial movement is mildly weak on the right VIII: hearing is intact to voice X: Uvula elevates symmetrically XI: Shoulder shrug is symmetric. XII: tongue is midline without atrophy or fasciculations.  Motor: Tone is normal. Bulk is normal. 5/5 strength was present on the left side, on the right she has proximal greater than distal right arm weakness, there do seem to be some give way component. 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  Impression: 45 year old female with transient period of unresponsiveness followed by right-sided weakness. CT shows possible stroke. It appears darker than I would expect for an  acute infarct, however it is on the correct side to cause right-sided symptoms. It is possible that she had a larger infarct causing aphasia which subsequently broke up leaving only the residual deficit. With a history of syncope, however, I will also order EEG. Also with her history of headaches, I will order MRV.  The fact that she is cocaine positive is likely contributory to her  current hypertensive crisis and likely stroke.  Stroke Risk Factors - hyperlipidemia and hypertension  1. HgbA1c, fasting lipid panel 2. MRI, MRA  of the brain without contrast, MR venogram head 3. Frequent neuro checks 4. Echocardiogram 5. Carotid dopplers 6. Prophylactic therapy-Antiplatelet med: Aspirin - dose 325mg  PO or 300mg  PR 7. Risk factor modification 8. Telemetry monitoring 9. PT consult, OT consult, Speech consult 10. EEG   Roland Rack, MD [facility] [phone]  If 7pm- 7am, please page neurology on call as listed in [place].

## 2015-02-07 NOTE — H&P (Signed)
Date: 02/07/2015               Patient Name:  Denise [name] MRN: [id number]  DOB: 1970-11-27 Age / Sex: 45 y.o., female   PCP: No primary care provider on file.         Medical Service: Internal Medicine Teaching Service         Attending Physician: Dr. Axel Filler, MD    First Contact: Dr. Liberty Handy Pager: [phone]  Second Contact: Dr. Julious Oka Pager: [phone]       After Hours (After 5p/  First Contact Pager: [phone]  weekends / holidays): Second Contact Pager: [phone]   Chief Complaint: Loss of Consciousness  History of Present Illness:   [name] is a 45 year old with a PMH of HTN, uterine fibroids, and treated syphylis who presents after an episode in which she lost consciousness this morning. The patient could not remember much detail from the event, so her husband, [name], assisted with providing history. Reportedly, she woke up this morning and had an episode of nausea and vomiting (clear emesis), which she does on most days. Then she described a worsening occipital headache, chest,  and abdominal pain, and cried out. She said the chest pain was in the center and non-radiating. Then she cried out, waking her husband who was sleeping, and he came over to find her on the floor. They did not know whether or not she hit her head. Thus husband said that she appeared "glassy-eyed," was trembling, and was unresponsive. The episode lasted for 5-10 minutes, and there was no bowel or bladder incontinence. She remained confused after the episode. She says he speech been slower since the episode and it is more difficult to speak. Her husband also notices that her face seems to droop on the left side intermittently for months. He reports 5 similar episodes since 2015, when they were in [place], in which she was unresponsive for 5-10 minutes, for which they received no additional workup. He says the episodes were also preceded by her saying, "I just don't  feel right." She also complains of some new onset right-sided weakness and loss of sensation. Her last known normal was "sometime this morning."  Mr. Humphrey has also noticed an overall decline in the functioning of his wife over the last couple years. She's had worsening memory and concentration. She can bathe herself, but she minimally participates in other household work (cooking, cleaning), which is done by her husband. She spends most of her days watching cartoons. When asked about her mood, she says, "I don't know." The husband notices a significant, gradual changes in her personality over the past couple years.  Other chronic symptoms have included shortness of breath , but cannot describe if its worse at rest or with activity.  She also has persistent hunger, and her husband says "she needs to eat something every 45 minutes or she gets moody." She repeatedly said during the interview, "I'm hungry. I want something to eat." She was seen in the ED and found to have a positive RPR, FTABs and was treated with penicillin three times. She never noticed any symptoms of her syphilis - no rashes. She does endorse some urinary urgency throughout the day, but no dysuria. Otherwise, she denies fever, vision changes, vertigo, diarrhea, constipation, bleeding episodes, dark stool, rashes, joint pain, cold or heat intolerance.   She is in the process of establishing with a PCP in the area, and in fact had  an appointment today to "adjust my blood pressure medication." Family history is positive for strokes on her maternal side, but no family history of seizures, early onset dementias. She smokes 1/2 ppd, drinks at least a 40 oz [employer] a day, and no drug use.   In the ED, her BP was 175/115, and her other vital signs were stable. UA, preg test, BMET, CBC were unremarkable. A CT head was obtained and demonstrated attenuation in the left anterior basal ganglia, which could represent an acute/recent infarct, a  degree of empty sella, and calcifications in both carotids.  Meds: Current Facility-Administered Medications  Medication Dose Route Frequency Provider Last Rate Last Dose  . folic acid (FOLVITE) tablet 1 mg  1 mg Oral Daily Norman Herrlich, MD      . heparin injection 5,000 Units  5,000 Units Subcutaneous 3 times per day Norman Herrlich, MD      . lisinopril (PRINIVIL,ZESTRIL) tablet 20 mg  20 mg Oral BID Norman Herrlich, MD      . LORazepam (ATIVAN) tablet 1 mg  1 mg Oral Q6H PRN Norman Herrlich, MD       Or  . LORazepam (ATIVAN) injection 1 mg  1 mg Intravenous Q6H PRN Norman Herrlich, MD      . multivitamin with minerals tablet 1 tablet  1 tablet Oral Daily Norman Herrlich, MD      . thiamine (VITAMIN B-1) tablet 100 mg  100 mg Oral Daily Norman Herrlich, MD       Or  . thiamine (B-1) injection 100 mg  100 mg Intravenous Daily Norman Herrlich, MD       Current Outpatient Prescriptions  Medication Sig Dispense Refill  . ibuprofen (ADVIL,MOTRIN) 800 MG tablet Take 1 tablet (800 mg total) by mouth 3 (three) times daily. 21 tablet 0  . lisinopril (PRINIVIL,ZESTRIL) 20 MG tablet Take 20 mg by mouth 2 (two) times daily.    . naproxen sodium (ANAPROX) 220 MG tablet Take 220 mg by mouth 2 (two) times daily with a meal.    . traMADol-acetaminophen (ULTRACET) 37.5-325 MG tablet Take 1 tablet by mouth every 6 (six) hours as needed. 30 tablet 0    Allergies: Allergies as of 02/07/2015  . (No Known Allergies)   Past Medical History  Diagnosis Date  . Hypertension    Past Surgical History  Procedure Laterality Date  . Cesarean section     No family history on file. Social History   Social History  . Marital Status: Married    Spouse Name: N/A  . Number of Children: N/A  . Years of Education: N/A   Occupational History  . Not on file.   Social History Main Topics  . Smoking status: Current Every Day Smoker  . Smokeless tobacco: Never Used  . Alcohol Use: 0.0 oz/week    0 Standard  drinks or equivalent per week  . Drug Use: Not on file  . Sexual Activity: Yes    Birth Control/ Protection: None   Other Topics Concern  . Not on file   Social History Narrative    Review of Systems: All systems negative except per HPI  Physical Exam: Blood pressure 194/114, pulse 80, temperature 98.3 F (36.8 C), temperature source Oral, resp. rate 17, height 5\' 4"  (1.626 m), weight 251 lb (113.853 kg), last menstrual period 12/08/2014, SpO2 98 %. General: Lying in bed, not in any apparent distress HEENT: Head atraumatic. Pupils equal round  and reactive to light and accomodation. EOMI. No scleral icterus. Poor dentition, no tonsillar exudate or erythema, no cervical adenopathy.  Cardiovascular: Regular rate and rhythm. No murmurs, rubs, or gallops Pulmonary: Clear to auscultation bilaterally. Unlabored breathing. Abdominal: Obese, soft. Diffuse tenderness to palpation, particularly in the lower quadrants. No masses or rebound. Skin: Hirsute face and chest. Hypopigmented areas on right arm and face. No rashes.  Extremities: No clubbing, cyanosis, or edema. 2+ DP Neurological: Alert and oriented x3. Slowed speech but comprehensible. Unable to repeat "no ifs, ands, or buts." Unable to test recall since she cannot repeat phrases. Positional tremor noted in right hand only, not at rest. 4+/5 strength in right extremities (flexion, extension, dorsi/plantar flexion, grip strength), 5/5 on the left. FTN normal on left but unable to perform on the right.  Psychiatric: Child-like behavior  Lab results: Basic Metabolic Panel:  Recent Labs  02/07/15 1354  NA 138  K 4.0  CL 105  CO2 22  GLUCOSE 106*  BUN <5*  CREATININE 0.73  CALCIUM 9.3    Recent Labs  02/07/15 1354  WBC 9.8  HGB 15.0  HCT 44.0  MCV 91.5  PLT 300   CBG:  Recent Labs  02/07/15 1406  GLUCAP 100*   Urinalysis:  Recent Labs  02/07/15 1504  COLORURINE AMBER*  LABSPEC 1.019  PHURINE 5.5  GLUCOSEU  NEGATIVE  HGBUR NEGATIVE  BILIRUBINUR NEGATIVE  KETONESUR NEGATIVE  PROTEINUR NEGATIVE  NITRITE NEGATIVE  LEUKOCYTESUR NEGATIVE    Imaging results:  Ct Head Wo Contrast  02/07/2015  CLINICAL DATA:  Seizure with syncopal episode.  Hypertension. EXAM: CT HEAD WITHOUT CONTRAST CT CERVICAL SPINE WITHOUT CONTRAST TECHNIQUE: Multidetector CT imaging of the head and cervical spine was performed following the standard protocol without intravenous contrast. Multiplanar CT image reconstructions of the cervical spine were also generated. COMPARISON:  None. FINDINGS: CT HEAD FINDINGS The ventricles are normal in size and configuration. There is mild invagination of CSF into the sella. There is no intracranial mass, hemorrhage, extra-axial fluid collection, or midline shift. 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 This focal area of may represent a recent/acute infarct. Elsewhere, gray-white compartments are normal. The bony calvarium appears intact. The mastoid air cells are clear. Visualized orbital regions appear symmetric and unremarkable. CT CERVICAL SPINE FINDINGS There is no fracture or spondylolisthesis. Prevertebral soft tissues and predental space regions are normal. There is mild disc space narrowing at C5-6, C6-7, and C7-T1. There is no nerve root edema or effacement. No disc extrusion or stenosis. There is calcification in each carotid artery. IMPRESSION: CT head: Decreased attenuation in the anterior left basal ganglia, a finding suspicious for recent/acute infarct in this area. Elsewhere gray-white compartments appear normal. No hemorrhage or well-defined mass. Mild degree of empty sella. CT cervical spine: Mild osteoarthritic change. No fracture or spondylolisthesis. Foci of  carotid artery calcification on both sides. Electronically Signed   By: Lowella Grip III M.D.   On: 02/07/2015 14:35   Ct Cervical Spine Wo Contrast  02/07/2015  CLINICAL DATA:  Seizure with syncopal episode.  Hypertension. EXAM: CT HEAD WITHOUT CONTRAST CT CERVICAL SPINE WITHOUT CONTRAST TECHNIQUE: Multidetector CT imaging of the head and cervical spine was performed following the standard protocol without intravenous contrast. Multiplanar  CT image reconstructions of the cervical spine were also generated. COMPARISON:  None. FINDINGS: CT HEAD FINDINGS The ventricles are normal in size and configuration. There is mild invagination of CSF into the sella. There is no intracranial mass, hemorrhage, extra-axial fluid collection, or midline shift. 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 This focal area of may represent a recent/acute infarct. Elsewhere, gray-white compartments are normal. The bony calvarium appears intact. The mastoid air cells are clear. Visualized orbital regions appear symmetric and unremarkable. CT CERVICAL SPINE FINDINGS There is no fracture or spondylolisthesis. Prevertebral soft tissues and predental space regions are normal. There is mild disc space narrowing at C5-6, C6-7, and C7-T1. There is no nerve root edema or effacement. No disc extrusion or stenosis. There is calcification in each carotid artery. IMPRESSION: CT head: Decreased attenuation in the anterior left basal ganglia, a finding suspicious for recent/acute infarct in this area. Elsewhere gray-white compartments appear normal. No hemorrhage or well-defined mass. Mild degree of empty sella. CT cervical spine: Mild osteoarthritic change. No fracture or spondylolisthesis. Foci of carotid artery calcification  on both sides. Electronically Signed   By: Lowella Grip III M.D.   On: 02/07/2015 14:35   EKG: Sinus rhythm. QTc 474. No ischemic changes.  Assessment & Plan by Problem:  Loss of Consciousness: Based on the description provided by her and her husband, the differential includes seizure with todd's paralysis, ischemic stroke. However, syncope is usually a negative predictive factor for an ischemic stroke. If this is an ischemic stroke, she is outside of the window for thrombolytic intervention. Hypertensive encephalopathy is also consideration, but BP was 184/98 at an outpatient visit 11/2014. Arrhythmia and hypotension are less suspicious given normal EKG and no evidence of AKI. If in fact there were a seizure, etiologies could be alcohol withdrawal or neurosyphilis. Neurosyphilis could explain her dementia-like picture as well. Wernicke  - Aspirin 325 mg, no hemorrhage noted - Permissive HTN - holding lisinopril - Metoclopramide prn nausea - Neurology consulted - likely will need TTE and carotid dopplers - MRI/MRA w/ and w/o contrast - Orthostatic vital signs - PT/OT/SLP evaluation - EEG - Telemetry  Chest pain: Non-radiating chest pain. Does not appear to be ACS. EKG reassuring. - Trending troponins  Alcohol Use Disorder: Last drink was on 1/12. Denies any withdrawal symptoms. She may have had a withdrawal seizure, but chronicity makes less likely. - CIWA protocol - Thiamine, multivitamin, and folate   HTN: Holding lisinopril for now.  DVT Prophylaxis: Heparin [place]  Dispo: Disposition is deferred at this time, awaiting improvement of current medical problems. Anticipated discharge in approximately 2-3 day(s).   The patient does not have a current PCP (No primary care provider on file.) and does not need an [facility] hospital follow-up appointment after discharge.  The patient does have transportation limitations that hinder transportation to clinic appointments.  Signed: Liberty Handy,  MD 02/07/2015, 4:36 PM

## 2015-02-07 NOTE — ED Notes (Signed)
Pt CBG, 100.

## 2015-02-07 NOTE — ED Notes (Signed)
Form home via [facility], syncopal episode pta, HTN, also c/o abd pain and HA, 228/122, other VSS, CBG 103, c-collar in place on arrival

## 2015-02-08 ENCOUNTER — Observation Stay (HOSPITAL_BASED_OUTPATIENT_CLINIC_OR_DEPARTMENT_OTHER): Payer: Medicaid Other

## 2015-02-08 ENCOUNTER — Observation Stay (HOSPITAL_COMMUNITY): Payer: Medicaid Other

## 2015-02-08 DIAGNOSIS — M6289 Other specified disorders of muscle: Secondary | ICD-10-CM

## 2015-02-08 DIAGNOSIS — I10 Essential (primary) hypertension: Secondary | ICD-10-CM

## 2015-02-08 DIAGNOSIS — I6789 Other cerebrovascular disease: Secondary | ICD-10-CM | POA: Diagnosis not present

## 2015-02-08 DIAGNOSIS — F1410 Cocaine abuse, uncomplicated: Secondary | ICD-10-CM

## 2015-02-08 DIAGNOSIS — F1020 Alcohol dependence, uncomplicated: Secondary | ICD-10-CM

## 2015-02-08 DIAGNOSIS — R55 Syncope and collapse: Secondary | ICD-10-CM | POA: Diagnosis not present

## 2015-02-08 DIAGNOSIS — R531 Weakness: Secondary | ICD-10-CM | POA: Insufficient documentation

## 2015-02-08 LAB — GLUCOSE, CAPILLARY: GLUCOSE-CAPILLARY: 115 mg/dL — AB (ref 65–99)

## 2015-02-08 LAB — LIPID PANEL
Cholesterol: 262 mg/dL — ABNORMAL HIGH (ref 0–200)
HDL: 35 mg/dL — ABNORMAL LOW (ref >40)
LDL Cholesterol: 157 mg/dL — ABNORMAL HIGH (ref 0–99)
Total CHOL/HDL Ratio: 7.5 RATIO
Triglycerides: 352 mg/dL — ABNORMAL HIGH (ref <150)
VLDL: 70 mg/dL — ABNORMAL HIGH (ref 0–40)

## 2015-02-08 LAB — HEMOGLOBIN A1C
HEMOGLOBIN A1C: 5.8 % — AB (ref 4.8–5.6)
MEAN PLASMA GLUCOSE: 120 mg/dL

## 2015-02-08 LAB — PROTIME-INR
INR: 1.02 (ref 0.00–1.49)
PROTHROMBIN TIME: 13.6 s (ref 11.6–15.2)

## 2015-02-08 LAB — CSF CELL COUNT WITH DIFFERENTIAL
RBC Count, CSF: 57 /mm3 — ABNORMAL HIGH
TUBE #: 1
WBC, CSF: 1 /mm3 (ref 0–5)

## 2015-02-08 LAB — PROTEIN, CSF: TOTAL PROTEIN, CSF: 25 mg/dL (ref 15–45)

## 2015-02-08 LAB — TROPONIN I

## 2015-02-08 LAB — APTT: APTT: 26 s (ref 24–37)

## 2015-02-08 LAB — GLUCOSE, CSF: Glucose, CSF: 71 mg/dL — ABNORMAL HIGH (ref 40–70)

## 2015-02-08 MED ORDER — ATORVASTATIN CALCIUM 40 MG PO TABS: 40.0000 mg | ORAL_TABLET | Freq: Every day | ORAL | Status: DC

## 2015-02-08 MED ORDER — LISINOPRIL 20 MG PO TABS
20.0000 mg | ORAL_TABLET | Freq: Two times a day (BID) | ORAL | Status: DC
  Administered 2015-02-08 – 2015-02-09 (×3): 20 mg via ORAL
  Filled 2015-02-08 (×3): qty 1

## 2015-02-08 NOTE — Progress Notes (Signed)
Subjective:  Patient reports that he sensation and strength have improved but are not yet at her baseline. She has had no more blackouts. She denies any headache this morning.  She provided additional history, saying that she had smoked crack cocaine the day prior to her incident.   Objective: Vital signs in last 24 hours: Filed Vitals:   02/08/15 0415 02/08/15 0600 02/08/15 0800 02/08/15 1042  BP:  163/91 167/98 169/101  Pulse:  80 63   Temp: 98.4 F (36.9 C)     TempSrc: Oral     Resp:   9   Height:      Weight: 247 lb 4.8 oz (112.175 kg)     SpO2:  96% 94%    Weight change:   Intake/Output Summary (Last 24 hours) at 02/08/15 1206 Last data filed at 02/08/15 1100  Gross per 24 hour  Intake    580 ml  Output    850 ml  Net   -270 ml    Physical Exam: General: Sitting up in bed, not in any apparent distress  HEENT: Pupils equal round and reactive to light and accomodation. EOMI.  Cardiovascular: Regular rate and rhythm. No murmurs, rubs, or gallops  Pulmonary: Clear to auscultation bilaterally. Unlabored breathing.  Abdominal: Obese, soft. Soft NT/ND. No masses or rebound.  Extremities: No clubbing, cyanosis, or edema.  Neurological: Alert and oriented x3. Speech normal. Can repeat "no ifs, ands, or buts." Recalled 3/3 items after 5 minutes. Persistent 4+/5 strength in right extremities (flexion, extension, dorsi/plantar flexion, grip strength), 5/5 on the left. FTN normal on right and left Psychiatric: Child-like behavior   Lab Results: Basic Metabolic Panel:  Recent Labs Lab 02/07/15 1354  NA 138  K 4.0  CL 105  CO2 22  GLUCOSE 106*  BUN <5*  CREATININE 0.73  CALCIUM 9.3   CBC:  Recent Labs Lab 02/07/15 1354  WBC 9.8  HGB 15.0  HCT 44.0  MCV 91.5  PLT 300   Cardiac Enzymes:  Recent Labs Lab 02/07/15 1735 02/07/15 2206 02/08/15 0422  TROPONINI <0.03 <0.03 <0.03   BNP: No results for input(s): PROBNP in the last 168 hours. D-Dimer: No  results for input(s): DDIMER in the last 168 hours. CBG:  Recent Labs Lab 02/07/15 1406  GLUCAP 100*   Hemoglobin A1C:  Recent Labs Lab 02/07/15 1736  HGBA1C 5.8*   Fasting Lipid Panel:  Recent Labs Lab 02/08/15 0422  CHOL 262*  HDL 35*  LDLCALC 157*  TRIG 352*  CHOLHDL 7.5   Thyroid Function Tests:  Recent Labs Lab 02/07/15 1737  TSH 2.587   Urine Drug Screen: Drugs of Abuse     Component Value Date/Time   LABOPIA NONE DETECTED 02/07/2015 1819   COCAINSCRNUR POSITIVE* 02/07/2015 1819   LABBENZ NONE DETECTED 02/07/2015 1819   AMPHETMU NONE DETECTED 02/07/2015 1819   THCU NONE DETECTED 02/07/2015 1819   LABBARB NONE DETECTED 02/07/2015 1819    Alcohol Level:  Recent Labs Lab 02/07/15 1737  ETH <5   Urinalysis:  Recent Labs Lab 02/07/15 1504  COLORURINE AMBER*  LABSPEC 1.019  PHURINE 5.5  GLUCOSEU NEGATIVE  HGBUR NEGATIVE  BILIRUBINUR NEGATIVE  KETONESUR NEGATIVE  PROTEINUR NEGATIVE  NITRITE NEGATIVE  LEUKOCYTESUR NEGATIVE    Micro Results: No results found for this or any previous visit (from the past 240 hour(s)). Studies/Results: Ct Head Wo Contrast  02/07/2015  CLINICAL DATA:  Seizure with syncopal episode.  Hypertension. EXAM: CT HEAD WITHOUT CONTRAST CT CERVICAL  SPINE WITHOUT CONTRAST TECHNIQUE: Multidetector CT imaging of the head and cervical spine was performed following the standard protocol without intravenous contrast. Multiplanar CT image reconstructions of the cervical spine were also generated. COMPARISON:  None. FINDINGS: CT HEAD FINDINGS The ventricles are normal in size and configuration. There is mild invagination of CSF into the sella. There is no intracranial mass, hemorrhage, extra-axial fluid collection, or midline shift. There is a focal infarct involving the anterior left basal ganglia with involvement of a portion of the head of the caudate on the left, a portion of the anterior limb of the left internal capsule, and a  portion of the anterior aspect of the left lentiform nucleus. This focal area of may represent a recent/acute infarct. Elsewhere, gray-white compartments are normal. The bony calvarium appears intact. The mastoid air cells are clear. Visualized orbital regions appear symmetric and unremarkable. CT CERVICAL SPINE FINDINGS There is no fracture or spondylolisthesis. Prevertebral soft tissues and predental space regions are normal. There is mild disc space narrowing at C5-6, C6-7, and C7-T1. There is no nerve root edema or effacement. No disc extrusion or stenosis. There is calcification in each carotid artery. IMPRESSION: CT head: Decreased attenuation in the anterior left basal ganglia, a finding suspicious for recent/acute infarct in this area. Elsewhere gray-white compartments appear normal. No hemorrhage or well-defined mass. Mild degree of empty sella. CT cervical spine: Mild osteoarthritic change. No fracture or spondylolisthesis. Foci of carotid artery calcification on both sides. Electronically Signed   By: Lowella Grip III M.D.   On: 02/07/2015 14:35   Ct Cervical Spine Wo Contrast  02/07/2015  CLINICAL DATA:  Seizure with syncopal episode.  Hypertension. EXAM: CT HEAD WITHOUT CONTRAST CT CERVICAL SPINE WITHOUT CONTRAST TECHNIQUE: Multidetector CT imaging of the head and cervical spine was performed following the standard protocol without intravenous contrast. Multiplanar CT image reconstructions of the cervical spine were also generated. COMPARISON:  None. FINDINGS: CT HEAD FINDINGS The ventricles are normal in size and configuration. There is mild invagination of CSF into the sella. There is no intracranial mass, hemorrhage, extra-axial fluid collection, or midline shift. There is a focal infarct involving the anterior left basal ganglia with involvement of a portion of the head of the caudate on the left, a portion of the anterior limb of the left internal capsule, and a portion of the anterior  aspect of the left lentiform nucleus. This focal area of may represent a recent/acute infarct. Elsewhere, gray-white compartments are normal. The bony calvarium appears intact. The mastoid air cells are clear. Visualized orbital regions appear symmetric and unremarkable. CT CERVICAL SPINE FINDINGS There is no fracture or spondylolisthesis. Prevertebral soft tissues and predental space regions are normal. There is mild disc space narrowing at C5-6, C6-7, and C7-T1. There is no nerve root edema or effacement. No disc extrusion or stenosis. There is calcification in each carotid artery. IMPRESSION: CT head: Decreased attenuation in the anterior left basal ganglia, a finding suspicious for recent/acute infarct in this area. Elsewhere gray-white compartments appear normal. No hemorrhage or well-defined mass. Mild degree of empty sella. CT cervical spine: Mild osteoarthritic change. No fracture or spondylolisthesis. Foci of carotid artery calcification on both sides. Electronically Signed   By: Lowella Grip III M.D.   On: 02/07/2015 14:35   Mr Jodene Nam Head Wo Contrast  02/07/2015  CLINICAL DATA:  Patient suffered episode of unresponsiveness, followed by right-sided weakness. Stroke risk factors include cocaine use, hypertension, and hyperlipidemia. EXAM: MRA HEAD WITHOUT CONTRAST TECHNIQUE: Angiographic  images of the Circle of Willis were obtained using MRA technique without intravenous contrast. COMPARISON:  MRI brain and MRV reported separately. FINDINGS: The internal carotid arteries are widely patent. Codominant vertebral arteries. Mild irregularity of the M1 segments of both middle cerebral arteries, non flow reducing. Similar slight beading of the M2 and M3 vessels. No anterior cerebral artery disease of significance. Slight irregularity of the P1, P2, and P3 segments of the posterior cerebral arteries bilaterally. Same differential considerations. Moderately irregularity RIGHT superior cerebellar artery, LEFT  anterior inferior cerebellar artery, and LEFT PICA. RIGHT AICA not visualized. There could be minor irregularity in the basilar artery proximal to the AICA. IMPRESSION: Mildly irregular intracranial vessels throughout, including M1 MCA segments, PCA segments, and cerebellar branches. These changes could be related to intracranial atherosclerotic disease or reflect nonspecific vasculitis, including drug induced. Electronically Signed   By: Staci Righter M.D.   On: 02/07/2015 20:33   Mr Brain Wo Contrast  02/07/2015  CLINICAL DATA:  45 year old female with transient period of unresponsiveness followed by right-sided weakness. Stroke Risk Factors include cocaine use, hyperlipidemia and hypertension. EXAM: MRI HEAD WITHOUT CONTRAST TECHNIQUE: Multiplanar, multiecho pulse sequences of the brain and surrounding structures were obtained without intravenous contrast. COMPARISON:  MRA and MRV reported separately. FINDINGS: No evidence for acute infarction, hemorrhage, mass lesion, hydrocephalus, or extra-axial fluid. Normal for age cerebral volume. Mild white matter disease, nonspecific, but could be related to chronic microvascular ischemic change. 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 Compensatory enlargement of the LEFT lateral ventricle (images 15 and 16, series 11 coronal T2) confirms chronicity. Partial empty sella. No tonsillar herniation. Visualized upper cervical region unremarkable. Flow voids are maintained in the carotid, basilar, and vertebral arteries. No sinus or mastoid disease. No orbital findings. Extracranial soft tissues grossly unremarkable. IMPRESSION: No acute intracranial abnormality. 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 See discussion above. Mild white matter disease, nonspecific. Electronically Signed   By: Staci Righter  M.D.   On: 02/07/2015 20:25   Mr Annell Greening Head  02/07/2015  CLINICAL DATA:  Right-sided weakness. Evaluate for venous sinus thrombosis. EXAM: MR VENOGRAM   HEAD WITHOUT CONTRAST TECHNIQUE: Angiographic images of the intracranial venous structures were obtained using MRV technique without intravenous contrast. COMPARISON:  MRI brain and MRA intracranial reported separately. FINDINGS: The superior sagittal sinus is widely patent. The LEFT transverse sinus is dominant, accepting the drainage of the superior sagittal sinus. The RIGHT transverse sinus accepts the drainage of the deep venous system and is therefore smaller. Both internal cerebral veins, vein of Galen, and straight sinus are all widely patent. There is partial volume signal loss of the RIGHT transverse sinus which is otherwise show to be patent and free of thrombus on axial source images. Both internal soon jugular veins are patent, LEFT dominant. No cortical venous thrombosis is identified. IMPRESSION: No evidence for deep venous thrombosis. CONTRAST:  The patient received 20 mL MultiHance and vomited. Postcontrast imaging could not be performed. Electronically Signed   By: Staci Righter M.D.   On: 02/07/2015 20:37   Medications: I have reviewed the patient's current medications. Scheduled Meds: . folic acid  1 mg Oral Daily  . lisinopril  20 mg Oral BID  . multivitamin with minerals  1 tablet Oral Daily  . thiamine  100 mg Oral Daily   Or  . thiamine  100 mg Intravenous Daily  Continuous Infusions:  PRN Meds:.LORazepam **OR** LORazepam Assessment/Plan: Active Problems:   Syncope   Right sided weakness  Loss of Consciousness: Semiology is suggestive of a seizure event rather than syncope. Acute ischemic stroke and venous sinus thrombosis are not suspected given negative MRI/MRA/MRV. If this is a seizure, etiology could be neurosyphilis in setting of previously positive . She was treated on three occasions at the [facility],  which would have been under-treatment in the setting of neurosyphilis. Will obtain lumbar puncture today. Will defer AED therapy until workup is complete. Crack cocaine may have been a contributor. Chronicity not c/w alcohol withdrawal seizure.  - Lumbar puncture today - CSF cell count, protein, glucose VDRL pending - EEG pending - TTE, carotid dopplers pending - Neurology following  Alcohol Use Disorder: Last drink was on 1/12. Denies any withdrawal symptoms. She may have had a withdrawal seizure, but chronicity makes less likely. - CIWA protocol - Thiamine, multivitamin, and folate   HTN: 169/101.  - Lisinopril 20 mg daily for now. Consider increasing if HTN remains high  DVT Prophylaxis: SCDs only  Dispo: Disposition is deferred at this time, awaiting improvement of current medical problems. Anticipated discharge in approximately 1-2 day(s).    The patient does not have a current PCP (No primary care provider on file.) and does not need an [facility] hospital follow-up appointment after discharge.  The patient does have transportation limitations that hinder transportation to clinic appointments.  .Services Needed at time of discharge: Y = Yes, Blank = No PT:   OT:   RN:   Equipment:   Other:       Liberty Handy, MD 02/08/2015, 12:06 PM

## 2015-02-08 NOTE — Progress Notes (Signed)
VASCULAR LAB PRELIMINARY  PRELIMINARY  PRELIMINARY  PRELIMINARY  Carotid duplex completed.    Preliminary report:  1-39% ICA plaquing.  Vertebral artery flow is antegrade.    Helyn Schwan, RVT 02/08/2015, 1:41 PM

## 2015-02-08 NOTE — Evaluation (Addendum)
Physical Therapy Evaluation Patient Details Name: Denise [name] MRN: [id number] DOB: 1970-06-12 Today's Date: 02/08/2015   History of Present Illness  Patient is a 45 yo female admitted 02/07/15 with transient loss of conciousness, headache, HTN.  MRI negative for infarct per chart.  Work-up continues.   PMH:  HTN, polysubstance abuse, h/o "episodes" similar to one on admit, latent syphilis-treated.    Clinical Impression  Patient presents with problems below.  Will benefit from acute PT to maximize functional mobility prior to discharge.  **During PT session while patient ambulating with therapist and RW, patient's entire body began shaking and patient became less responsive (approx 18:20).  She was able to follow commands to sit onto [facility].  PT scooted patient on BSC back to the bed.  At that point, patient required moderate assistance to transfer to the bed and return to supine.  Patient then not responsive - not answering questions or following commands, and staring.  Called RN's to bedside. MD's called to bedside.  PT provided information and remained in room to answer any questions from RN/MD.  RN gathered vitals and included in chart.    Follow Up Recommendations  (TBD - will f/u at next session)    Equipment Recommendations  Other (comment) (TBD)    Recommendations for Other Services Other (comment) (TBD)     Precautions / Restrictions Precautions Precautions: Fall Restrictions Weight Bearing Restrictions: No      Mobility  Bed Mobility Overal bed mobility: Needs Assistance Bed Mobility: Supine to Sit;Sit to Supine     Supine to sit: Min guard Sit to supine: Min assist   General bed mobility comments: Patient able to move to EOB with assist for safety only.  Following episode, patient required assist to bring LE's onto bed to return to supine.  Patient becoming increasingly unresponsive and staring.  RN called to room.  Transfers Overall transfer level: Needs  assistance Equipment used: Rolling walker (2 wheeled) Transfers: Sit to/from [facility] Sit to Stand: Min guard;Min assist Stand pivot transfers: Mod assist       General transfer comment: Verbal cues for hand placement.  Patient stood with assist for safety only.  Patient required min assist to sit onto [facility] during episode.  Slid patient on BSC back to the bed.  Stand-pivot transfer with mod assist.  Ambulation/Gait Ambulation/Gait assistance: Min assist Ambulation Distance (Feet): 4 Feet Assistive device: Rolling walker (2 wheeled) Gait Pattern/deviations: Step-through pattern;Decreased step length - right;Decreased step length - left;Decreased stride length;Shuffle;Ataxic Gait velocity: decreased Gait velocity interpretation: Below normal speed for age/gender General Gait Details: Verbal cues for safe use of RW.  Patient able to ambulate initially with min guard assist.  Patient then began shaking throughout body.  Becoming less responsive.  Patient assisted onto [facility] and returned to bed.  Stairs            Wheelchair Mobility    Modified Rankin (Stroke Patients Only)       Balance                                             Pertinent Vitals/Pain Pain Assessment: Faces Faces Pain Scale: Hurts even more Pain Location: Posterior head Pain Descriptors / Indicators: Headache Pain Intervention(s):  (RN aware)    Home Living Family/patient expects to be discharged to:: Private residence Living Arrangements: Spouse/significant other  Additional Comments: Unable to obtain further information - patient had episode/event during session.    Prior Function Level of Independence: Independent         Comments: Reports she does "furniture walk" at times.  Husband does cooking and cleaning.     Hand Dominance        Extremity/Trunk Assessment   Upper Extremity Assessment: RUE deficits/detail RUE Deficits /  Details: Slightly weak at beginning of session - 4/5.  Following episode, minimal movement noted during MD evaluation         Lower Extremity Assessment: Overall WFL for tasks assessed (RLE only slightly weak at 4/5 at beginning of session)         Communication   Communication: No difficulties  Cognition Arousal/Alertness: Awake/alert (Initially alert.  During episode, patient became [place]) Behavior During Therapy: [facility] for tasks assessed/performed;Anxious Overall Cognitive Status: Within Functional Limits for tasks assessed                      General Comments      Exercises        Assessment/Plan    PT Assessment Patient needs continued PT services  PT Diagnosis Difficulty walking;Generalized weakness;Acute pain   PT Problem List Decreased strength;Decreased activity tolerance;Decreased balance;Decreased mobility;Decreased knowledge of use of DME;Pain  PT Treatment Interventions DME instruction;Gait training;Functional mobility training;Therapeutic activities;Balance training;Patient/family education   PT Goals (Current goals can be found in the Care Plan section) Acute Rehab PT Goals Patient Stated Goal: Not stated PT Goal Formulation: With patient/family Time For Goal Achievement: 02/15/15 Potential to Achieve Goals: Good    Frequency Min 3X/week   Barriers to discharge        Co-evaluation               End of Session Equipment Utilized During Treatment: Gait belt Activity Tolerance: Treatment limited secondary to medical complications (Comment) (Shaking/unresponsive episode) Patient left: in bed;with call bell/phone within reach;with bed alarm set;with nursing/sitter in room (RN's and MD's to room to assess) Nurse Communication: Other (comment) (Unresponsive episode during gait)    Functional Assessment Tool Used: Clinical judgement Functional Limitation: Mobility: Walking and moving around Mobility: Walking and Moving Around Current  Status [id number]): At least 20 percent but less than 40 percent impaired, limited or restricted Mobility: Walking and Moving Around Goal Status [phone]): At least 1 percent but less than 20 percent impaired, limited or restricted    Time: 1810-1840 PT Time Calculation (min) (ACUTE ONLY): 30 min   Charges:   PT Evaluation $PT Eval High Complexity: 1 Procedure PT Treatments $Gait Training: 8-22 mins   PT G Codes:   PT G-Codes **NOT FOR INPATIENT CLASS** Functional Assessment Tool Used: Clinical judgement Functional Limitation: Mobility: Walking and moving around Mobility: Walking and Moving Around Current Status [id number]): At least 20 percent but less than 40 percent impaired, limited or restricted Mobility: Walking and Moving Around Goal Status [phone]): At least 1 percent but less than 20 percent impaired, limited or restricted    Despina Pole 02/08/2015, 7:10 PM Carita Pian. Sanjuana Kava, [place] Pager [phone]

## 2015-02-08 NOTE — Progress Notes (Signed)
We were called by RN regarding patient who was reported to be having another questionable seizure-like episode while working with physical therapy.  Patient reportedly got up and out of bed with PT, took a few steps, then began having an episode of shaking, staring spell, and not responding to questions.  On our arrival, patient lying in bed, eyes open, not following commands but was tracking with her eyes.  After approximately, 5 minutes in the room, patient became more alert, able to speak, answer questions, and provide some recall.  Her CBG was 115, BP elevated to 189/106.  Physical Exam: General: lying in bed, initially not following commands, but after becoming more alert was oriented x 3 HEENT: PERRL and EOMI Cardiac: RRR, no rubs, murmurs or gallops Pulm: no increased work of breathing, symmetric chest rise and fall Abd: obese, soft, nontender, nondistended, BS present Ext: warm and well perfused, no pedal edema, SCDs in place Neuro: alert and oriented X3, initially not following commands but able to regain this after a few minutes.  Strength 5/5 on the left.  Strength 3/5 on the right upper and lower extremity with possible neglect.  Patient observed voluntarily moving right arm to scratch her face and pull blankets up, but when asked to hold her arm up it flaccidly falls to the bed  Assessment/Plan: - concern for undiagnosed seizure disorder despite normal EEG earlier today.  Do not suspect CVA or venous sinus thrombosis given imaging findings.  Other consideration includes neurosyphilis, TIA, alcohol withdrawal seizure, crack cocaine contributor - will defer AED therapy at this time and discuss with primary team in the morning - follow up lumbar puncture results - hold off on anti-hypertensive therapy for now, consider Hydralazine as needed for sustained BP's > [phone] systolic

## 2015-02-08 NOTE — Progress Notes (Signed)
  Echocardiogram 2D Echocardiogram has been performed.  Denise [name] 02/08/2015, 9:02 AM

## 2015-02-08 NOTE — Progress Notes (Signed)
STROKE TEAM PROGRESS NOTE   HISTORY AT THE TIME OF INITIAL EVALUATION Denise [name] [name] is a 45 y.o. female with a history of hypertension who presents with right-sided weakness following an episode of "syncope." He described her as glassy eye and lasting 5-10 minutes. Her speech has since improved, however she has now had persistent right-sided weakness since that time.  CT scan was obtained in the emergency room which demonstrates hypodensity in the left frontal region which could be an area to cause her symptoms.  She has had several similar episodes, but has not had one in the past year.   LKW: Noon tpa given?: no, stroke not initially suspected.   SUBJECTIVE (INTERVAL HISTORY) Her MD from primary team was is at the bedside.  Overall she feels her condition is gradually improving. She reports slight right sided weakness   OBJECTIVE Temp:  [97.3 F (36.3 C)-98.4 F (36.9 C)] 98.4 F (36.9 C) (01/14 0415) Pulse Rate:  [63-88] 63 (01/14 0800) Cardiac Rhythm:  [-] Normal sinus rhythm (01/14 0801) Resp:  [9-26] 9 (01/14 0800) BP: (143-210)/(81-129) 167/98 mmHg (01/14 0800) SpO2:  [94 %-100 %] 94 % (01/14 0800) Weight:  [112.175 kg (247 lb 4.8 oz)-113.853 kg (251 lb)] 112.175 kg (247 lb 4.8 oz) (01/14 0415)  CBC:   Recent Labs Lab 02/07/15 1354  WBC 9.8  HGB 15.0  HCT 44.0  MCV 91.5  PLT [id number]    Basic Metabolic Panel:   Recent Labs Lab 02/07/15 1354  NA 138  K 4.0  CL 105  CO2 22  GLUCOSE 106*  BUN <5*  CREATININE 0.73  CALCIUM 9.3    Lipid Panel:     Component Value Date/Time   CHOL 262* 02/08/2015 0422   TRIG 352* 02/08/2015 0422   HDL 35* 02/08/2015 0422   CHOLHDL 7.5 02/08/2015 0422   VLDL 70* 02/08/2015 0422   LDLCALC 157* 02/08/2015 0422   HgbA1c:  Lab Results  Component Value Date   HGBA1C 5.8* 02/07/2015   Urine Drug Screen:     Component Value Date/Time   LABOPIA NONE DETECTED 02/07/2015 1819   COCAINSCRNUR POSITIVE* 02/07/2015  1819   LABBENZ NONE DETECTED 02/07/2015 1819   AMPHETMU NONE DETECTED 02/07/2015 1819   THCU NONE DETECTED 02/07/2015 1819   LABBARB NONE DETECTED 02/07/2015 1819      IMAGING  Ct Head Wo Contrast 02/07/2015   CT head: Decreased attenuation in the anterior left basal ganglia, a finding suspicious for recent/acute infarct in this area. Elsewhere gray-white compartments appear normal. No hemorrhage or well-defined mass. Mild degree of empty sella.   Ct Cervical Spine Wo Contrast 02/07/2015   Mild osteoarthritic change. No fracture or spondylolisthesis. Foci of carotid artery calcification on both sides.   Mr Jodene Nam Head Wo Contrast 02/07/2015   Mildly irregular intracranial vessels throughout, including M1 MCA segments, PCA segments, and cerebellar branches. These changes could be related to intracranial atherosclerotic disease or reflect nonspecific vasculitis, including drug induced.   Mr Brain Wo Contrast 02/07/2015   No acute intracranial abnormality. 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 See discussion above. Mild white matter disease, nonspecific.    Mr Hilary Hertz 02/07/2015   No evidence for deep venous thrombosis.       CONTRAST:  The patient received 20 mL MultiHance and vomited.  Postcontrast imaging could not be performed.    PHYSICAL EXAM General - Well nourished, well developed, in NAD   Cardiovascular - Regular rate  and rhythm Pulmonary: CTA Abdomen: NT, ND, normal bowel sounds Extremities: No C/C/E  Neurological Exam Mental Status: Normal Orientation:  Oriented to person, place and time Speech:  Fluent; no dysarthria  Cranial Nerves:  PERRL; EOMI; visual fields full, face grossly symmetric, hearing grossly intact; shrug symmetric and tongue midline; patient reports numbness of left cheek  Motor Exam:  Tone:  Within normal limits; Strength: 5/5 throughout with best effort.   Patient does exhibit right sided  weakness however, when distracted, findings appear to be non-physiologic  Sensory: Intact to light touch throughout right side; reports left side numbness  Coordination:  Intact finger to nose  Gait: Deferred  ASSESSMENT/PLAN [name] [name] is a 45 y.o. female with history of hypertension presenting with syncope and subsequent right hemiparesis.  She did not receive IV t-PA due to no stroke initially suspected.   TIA/cocaine induced vasospasm versus seizure:  Possible Non-Dominant event with right sided symptoms  Resultant  Inconsistent exam  MRI  - no acute findings. 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  MRA - no high-grade stenosis. Question vasculitis.  MRV - No evidence for deep venous thrombosis. Postcontrast imaging could not be performed.  Carotid Doppler - pending  2D Echo - pending  EEG - Pending  LDL - 157  HgbA1c - 5.8  VTE prophylaxis - SCDs Diet regular Room service appropriate?: Yes; Fluid consistency:: Thin  No antithrombotic prior to admission, now on aspirin 325 mg daily  Patient counseled to be compliant with her antithrombotic medications  Ongoing aggressive stroke risk factor management  Therapy recommendations: Pending  Disposition: Pending  Hypertension  Blood pressures running high  Currently on lisinopril 20 mg twice daily. May need a second hypertension medication.  Hyperlipidemia  Home meds: No lipid lowering medications prior to admission  LDL 157, goal < 70  Recommend Lipitor 40 mg daily  Continue statin at discharge  Other Stroke Risk Factors  Cigarette smoker, advised to stop smoking.  ETOH use  Obesity, Body mass index is 42.43 kg/(m^2).   Hx stroke/TIA  Cocaine use  Other Active Problems  UDS positive for cocaine   Hospital day #   Mikey Bussing PA-C [facility] Pager [phone] 02/08/2015, 9:46 AM  NEUROLOGY ATTENDING NOTE: Patient was seen and  examined by me personally. Documentation reflects findings. The laboratory and radiographic studies reviewed by me. ROS completed by me personally and pertinent positives fully documented  Condition: Stable  Assessment and plan completed by me personally and fully documented above. Plans/Recommendations include:     Exam is challenging given history of previous stroke with possible residual findings in the setting of clear non-physiologic overlay and embellished right sided weakness  LP pending as part of w/u for possible seizure   EEG pending  SIGNED BY: Dr. Elissa Hefty     To contact Stroke Continuity provider, please refer to [email or web address] After hours, contact General Neurology

## 2015-02-08 NOTE — Progress Notes (Signed)
EEG completed, results pending. 

## 2015-02-08 NOTE — Progress Notes (Addendum)
Called to Patient room by PT. Patient laying in the bed alert, but not responding verbally or following commands. Had charge call MD.  BG 115. Put patient back on telemetry. SBP 189. Patient became tearful. MD to bedside. Patient came back around. Patient voluntarily moving right arm to scratch face and move blankets, but unable to move it on command. Will continue to monitor.

## 2015-02-08 NOTE — Procedures (Signed)
History: 45 year old female with a history of syncope, this time followed by right-sided weakness  Sedation: None  Technique: This is a 21 channel routine scalp EEG performed at the bedside with bipolar and monopolar montages arranged in accordance to the international 10/20 system of electrode placement. One channel was dedicated to EKG recording.    Background: The background consists of intermixed alpha and beta activities. There is a well defined posterior dominant rhythm of 10 Hz that attenuates with eye opening. Sleep is not recorded.   Photic stimulation: Physiologic driving is present  EEG Abnormalities: None  Clinical Interpretation: This normal EEG is recorded in the waking and drowsy state. There was no seizure or seizure predisposition recorded on this study. Please note that a normal EEG does not preclude the possibility of epilepsy.   Roland Rack, MD [facility] [phone]  If 7pm- 7am, please page neurology on call as listed in [place].

## 2015-02-08 NOTE — Evaluation (Signed)
SLP Cancellation Note  Patient Details Name: Fredrick Mcbryde MRN: [id number] DOB: 05-03-1970   Cancelled treatment:       Reason Eval/Treat Not Completed: Other (comment) (pt passed RN stroke swallow screen, please order speech language evaluation if desired given pt's aphasia presentation, thanks)   Luanna Salk, [place] [facility] SLP [phone]

## 2015-02-08 NOTE — Progress Notes (Signed)
Teaching service returned call and states it is fine that patient receive's the heparin sq. [facility] [employer]

## 2015-02-09 DIAGNOSIS — M6289 Other specified disorders of muscle: Secondary | ICD-10-CM

## 2015-02-09 MED ORDER — PNEUMOCOCCAL VAC POLYVALENT 25 MCG/0.5ML IJ INJ: 0.5000 mL | INJECTION | INTRAMUSCULAR | Status: DC

## 2015-02-09 NOTE — Progress Notes (Signed)
Physical Therapy Treatment Patient Details Name: Denise [name] MRN: [id number] DOB: 1970/12/05 Today's Date: 02/09/2015    History of Present Illness Patient is a 45 yo female admitted 02/07/15 with transient loss of conciousness, headache, HTN.  MRI negative for infarct per chart.  Work-up continues.   PMH:  HTN, polysubstance abuse, h/o "episodes" similar to one on admit, latent syphilis-treated.    PT Comments    Patient at independent/supervision level for all mobility and gait.  Good balance with gait.  Rt UE/LE strength good.  Patient back to baseline functional level.  No further PT needs identified - PT will sign off.    Follow Up Recommendations  No PT follow up;Supervision for mobility/OOB     Equipment Recommendations  None recommended by PT    Recommendations for Other Services       Precautions / Restrictions Precautions Precautions: Other (comment) (Fall during "episodes") Restrictions Weight Bearing Restrictions: No    Mobility  Bed Mobility Overal bed mobility: Independent                Transfers Overall transfer level: Independent Equipment used: None                Ambulation/Gait Ambulation/Gait assistance: Supervision Ambulation Distance (Feet): 130 Feet Assistive device: None Gait Pattern/deviations: WFL(Within Functional Limits)   Gait velocity interpretation: at or above normal speed for age/gender General Gait Details: Supervision for safety only.  No loss of balance with gait with no assistive device.   Stairs            Wheelchair Mobility    Modified Rankin (Stroke Patients Only)       Balance Overall balance assessment: Independent (Able to stand at sink and brush teeth/wash face.)                                  Cognition Arousal/Alertness: Awake/alert Behavior During Therapy: [facility] for tasks assessed/performed;Restless Overall Cognitive Status: Within Functional Limits for tasks  assessed                      Exercises      General Comments        Pertinent Vitals/Pain Pain Assessment: No/denies pain    Home Living                      Prior Function            PT Goals (current goals can now be found in the care plan section) Progress towards PT goals: Goals met/education completed, patient discharged from PT    Frequency  Min 3X/week    PT Plan Discharge plan needs to be updated    Co-evaluation             End of Session   Activity Tolerance: Patient tolerated treatment well Patient left: in bed;with call bell/phone within reach;with family/visitor present     Time: 1052-1106 PT Time Calculation (min) (ACUTE ONLY): 14 min  Charges:  $Gait Training: 8-22 mins                    G Codes:  Functional Assessment Tool Used: Clinical judgement Functional Limitation: Mobility: Walking and moving around Mobility: Walking and Moving Around Goal Status [phone]): At least 1 percent but less than 20 percent impaired, limited or restricted Mobility: Walking and Moving Around Discharge Status [phone]): At least  1 percent but less than 20 percent impaired, limited or restricted   Despina Pole 02/09/2015, 11:18 AM Carita Pian. Sanjuana Kava, [place] Pager [phone]

## 2015-02-09 NOTE — Progress Notes (Signed)
Pt requesting to leave. Went to the patient's room and she was already dressed. States she feels fine. Informed her she would be leaving against medical advice if she went home. Informed her of risks of leaving AMA w/o full workup for possible seizure including stroke and death. Pt states she just wants to go home and asking to sign AMA papers. Spoke with patients nurse and informed her of the situation.   Julious Oka, MD Internal Medicine Resident, PGY [facility] Pager: [phone]

## 2015-02-09 NOTE — Progress Notes (Signed)
STROKE TEAM PROGRESS NOTE   HISTORY AT THE TIME OF INITIAL EVALUATION Emonni Humphrey-Headen is a 45 y.o. female with a history of hypertension who presents with right-sided weakness following an episode of "syncope." He described her as glassy eye and lasting 5-10 minutes. Her speech has since improved, however she has now had persistent right-sided weakness since that time.  CT scan was obtained in the emergency room which demonstrates hypodensity in the left frontal region which could be an area to cause her symptoms.  She has had several similar episodes, but has not had one in the past year.   LKW: Noon tpa given?: no, stroke not initially suspected.   SUBJECTIVE (INTERVAL HISTORY) Her husband was at the bedside.  Overall she feels her condition is improved. She reported slight right sided weakness to her RN but denied weakness or any complaints for ROS for me.  We discussed the event that occurred yesterday during PT when she had staring and was apparently poorly responsive.  Her husband and RN were present.  Husband states she has a spell like this every 2 weeks since 2015.  RN noted that the findings were periodically inconsistent with seizure and expressed a concern regarding the non-physiologic component of the exam.  I explained the purpose of video EEG and sleep deprivation to better assess her spells and she stated she preferred to go home.  I encouraged her to stay   OBJECTIVE Temp:  [97.7 F (36.5 C)-98.8 F (37.1 C)] 97.7 F (36.5 C) (01/15 0400) Pulse Rate:  [74-88] 74 (01/15 0600) Cardiac Rhythm:  [-] Normal sinus rhythm (01/15 0724) Resp:  [13-20] 18 (01/15 0600) BP: (124-193)/(71-107) 141/71 mmHg (01/15 0600) SpO2:  [95 %-100 %] 95 % (01/15 0600) Weight:  [112.719 kg (248 lb 8 oz)] 112.719 kg (248 lb 8 oz) (01/15 0400)  CBC:   Recent Labs Lab 02/07/15 1354  WBC 9.8  HGB 15.0  HCT 44.0  MCV 91.5  PLT [id number]    Basic Metabolic Panel:   Recent Labs Lab  02/07/15 1354  NA 138  K 4.0  CL 105  CO2 22  GLUCOSE 106*  BUN <5*  CREATININE 0.73  CALCIUM 9.3    Lipid Panel:     Component Value Date/Time   CHOL 262* 02/08/2015 0422   TRIG 352* 02/08/2015 0422   HDL 35* 02/08/2015 0422   CHOLHDL 7.5 02/08/2015 0422   VLDL 70* 02/08/2015 0422   LDLCALC 157* 02/08/2015 0422   HgbA1c:  Lab Results  Component Value Date   HGBA1C 5.8* 02/07/2015   Urine Drug Screen:     Component Value Date/Time   LABOPIA NONE DETECTED 02/07/2015 1819   COCAINSCRNUR POSITIVE* 02/07/2015 1819   LABBENZ NONE DETECTED 02/07/2015 1819   AMPHETMU NONE DETECTED 02/07/2015 1819   THCU NONE DETECTED 02/07/2015 1819   LABBARB NONE DETECTED 02/07/2015 1819      IMAGING  Ct Head Wo Contrast 02/07/2015   CT head: Decreased attenuation in the anterior left basal ganglia, a finding suspicious for recent/acute infarct in this area. Elsewhere gray-white compartments appear normal. No hemorrhage or well-defined mass. Mild degree of empty sella.   Ct Cervical Spine Wo Contrast 02/07/2015   Mild osteoarthritic change. No fracture or spondylolisthesis. Foci of carotid artery calcification on both sides.   Mr Jodene Nam Head Wo Contrast 02/07/2015   Mildly irregular intracranial vessels throughout, including M1 MCA segments, PCA segments, and cerebellar branches. These changes could be related to intracranial atherosclerotic disease or  reflect nonspecific vasculitis, including drug induced.   Mr Brain Wo Contrast 02/07/2015   No acute intracranial abnormality. 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 See discussion above. Mild white matter disease, nonspecific.    Mr Hilary Hertz 02/07/2015   No evidence for deep venous thrombosis.       CONTRAST:  The patient received 20 mL MultiHance and vomited.  Postcontrast imaging could not be performed.    PHYSICAL EXAM General - Well nourished, well developed, in NAD    Cardiovascular - Regular rate and rhythm Pulmonary: CTA Abdomen: NT, ND, normal bowel sounds Extremities: No C/C/E  Neurological Exam Mental Status: Normal Orientation:  Oriented to person, place and time Speech:  Fluent; no dysarthria  Cranial Nerves:  PERRL; EOMI; visual fields full, face grossly symmetric, hearing grossly intact; shrug symmetric and tongue midline; patient reports numbness of left cheek  Motor Exam:  Tone:  Within normal limits; Strength: 5/5 throughout with best effort.   No weakness today  Sensory: Intact to light touch throughout   Coordination:  Intact finger to nose  Gait: Deferred  ASSESSMENT/PLAN Ms. Nithila Blosser is a 45 y.o. female with history of hypertension presenting with syncope and subsequent right hemiparesis.  She did not receive IV t-PA due to no stroke initially suspected.   TIA/cocaine induced vasospasm versus seizure:  Possible Non-Dominant event with right sided symptoms  Resultant  Inconsistent exam  MRI  - no acute findings. 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  MRA - no high-grade stenosis. Question vasculitis.  MRV - No evidence for deep venous thrombosis. Postcontrast imaging could not be performed.  Carotid Doppler - 1-39% ICA plaquing. Vertebral artery flow is antegrade.  LP - 12/09/15 - unremarkable  2D Echo - EF 60-65%. No cardiac source of emboli identified.  EEG - Abnormalities: None - Consider sleep deprived EEG  LDL - 157  HgbA1c - 5.8  VTE prophylaxis - SCDs Diet regular Room service appropriate?: Yes; Fluid consistency:: Thin  No antithrombotic prior to admission, now on aspirin 325 mg daily  Patient counseled to be compliant with her antithrombotic medications  Ongoing aggressive stroke risk factor management  Therapy recommendations: Pending  Disposition: Pending  Hypertension  Blood pressures running high  Currently on lisinopril 20 mg twice daily. May  need a second hypertension medication.  Hyperlipidemia  Home meds: No lipid lowering medications prior to admission  LDL 157, goal < 70  Recommend Lipitor 40 mg daily  Continue statin at discharge  Other Stroke Risk Factors  Cigarette smoker, advised to stop smoking.  ETOH use  Obesity, Body mass index is 42.63 kg/(m^2).   Hx stroke/TIA  Cocaine use  Other Active Problems  UDS positive for cocaine  Possible seizure activity   Hospital day #   Mikey Bussing PA-C [facility] Pager [phone] 02/09/2015, 8:48 AM  NEUROLOGY ATTENDING NOTE: Patient was seen and examined by me personally. Documentation reflects findings.  The laboratory and radiographic studies reviewed by me.  ROS completed by me personally and pertinent positives fully documented  Condition: Stable  Assessment and plan completed by me personally and fully documented above.   Plans/Recommendations include:     Again, the exam and yesterday's event are challenging to assess given clear non-physiologic overlay and embellished right sided weakness she exhibited yesterday.  Discussed case with primary team and decided on sleep deprived EEG.  Patient declined and left AMA  LP not consistent with  infection  SIGNED BY: Dr. Elissa Hefty     To contact Stroke Continuity provider, please refer to [email or web address] After hours, contact General Neurology

## 2015-02-09 NOTE — Discharge Summary (Signed)
Patient left AGAINST MEDICAL ADVICE    Name: Denise [name] MRN: [id number] DOB: 1970-08-20 45 y.o. PCP: No primary care provider on file.  Date of Admission: 02/07/2015  1:29 PM Date of Discharge: 02/09/2015 Attending Physician: Dr. Evette Doffing  Discharge Diagnosis: Active Problems:   Syncope   Right sided weakness  Discharge Medications:   Medication List    ASK your doctor about these medications        ibuprofen 800 MG tablet  Commonly known as:  ADVIL,MOTRIN  Take 1 tablet (800 mg total) by mouth 3 (three) times daily.     lisinopril 20 MG tablet  Commonly known as:  PRINIVIL,ZESTRIL  Take 20 mg by mouth 2 (two) times daily.     naproxen sodium 220 MG tablet  Commonly known as:  ANAPROX  Take 220 mg by mouth 2 (two) times daily with a meal.     traMADol-acetaminophen 37.5-325 MG tablet  Commonly known as:  ULTRACET  Take 1 tablet by mouth every 6 (six) hours as needed.        Disposition and follow-up:   DeniseDenise [name] was discharged from [facility] in Good condition.  At the hospital follow up visit please address:  1.  Pt left AMA before a sleep deprivation EEG could be done. Consider this if pt returns with recurrent staring spells.   2.  Labs / imaging needed at time of follow-up: none  3.  Pending labs/ test needing follow-up: CSF VDRL    Consultations: Treatment Team:  Md Stroke, MD  Procedures Performed:  Ct Head Wo Contrast  02/07/2015  CLINICAL DATA:  Seizure with syncopal episode.  Hypertension. EXAM: CT HEAD WITHOUT CONTRAST CT CERVICAL SPINE WITHOUT CONTRAST TECHNIQUE: Multidetector CT imaging of the head and cervical spine was performed following the standard protocol without intravenous contrast. Multiplanar CT image reconstructions of the cervical spine were also generated. COMPARISON:  None. FINDINGS: CT HEAD FINDINGS The ventricles are normal in size and configuration. There is mild invagination of CSF  into the sella. There is no intracranial mass, hemorrhage, extra-axial fluid collection, or midline shift. 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 This focal area of may represent a recent/acute infarct. Elsewhere, gray-white compartments are normal. The bony calvarium appears intact. The mastoid air cells are clear. Visualized orbital regions appear symmetric and unremarkable. CT CERVICAL SPINE FINDINGS There is no fracture or spondylolisthesis. Prevertebral soft tissues and predental space regions are normal. There is mild disc space narrowing at C5-6, C6-7, and C7-T1. There is no nerve root edema or effacement. No disc extrusion or stenosis. There is calcification in each carotid artery. IMPRESSION: CT head: Decreased attenuation in the anterior left basal ganglia, a finding suspicious for recent/acute infarct in this area. Elsewhere gray-white compartments appear normal. No hemorrhage or well-defined mass. Mild degree of empty sella. CT cervical spine: Mild osteoarthritic change. No fracture or spondylolisthesis. Foci of carotid artery calcification on both sides. Electronically Signed   By: Lowella Grip III M.D.   On: 02/07/2015 14:35   Ct Cervical Spine Wo Contrast  02/07/2015  CLINICAL DATA:  Seizure with syncopal episode.  Hypertension. EXAM: CT HEAD WITHOUT CONTRAST CT CERVICAL SPINE WITHOUT CONTRAST TECHNIQUE: Multidetector CT imaging of the head and cervical  spine was performed following the standard protocol without intravenous contrast. Multiplanar CT image reconstructions of the cervical spine were also generated. COMPARISON:  None. FINDINGS: CT HEAD FINDINGS The ventricles are normal in size and configuration. There is mild invagination of CSF into the sella. There is no  intracranial mass, hemorrhage, extra-axial fluid collection, or midline shift. 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 This focal area of may represent a recent/acute infarct. Elsewhere, gray-white compartments are normal. The bony calvarium appears intact. The mastoid air cells are clear. Visualized orbital regions appear symmetric and unremarkable. CT CERVICAL SPINE FINDINGS There is no fracture or spondylolisthesis. Prevertebral soft tissues and predental space regions are normal. There is mild disc space narrowing at C5-6, C6-7, and C7-T1. There is no nerve root edema or effacement. No disc extrusion or stenosis. There is calcification in each carotid artery. IMPRESSION: CT head: Decreased attenuation in the anterior left basal ganglia, a finding suspicious for recent/acute infarct in this area. Elsewhere gray-white compartments appear normal. No hemorrhage or well-defined mass. Mild degree of empty sella. CT cervical spine: Mild osteoarthritic change. No fracture or spondylolisthesis. Foci of carotid artery calcification on both sides. Electronically Signed   By: Lowella Grip III M.D.   On: 02/07/2015 14:35   Mr Jodene Nam Head Wo Contrast  02/07/2015  CLINICAL DATA:  Patient suffered episode of unresponsiveness, followed by right-sided weakness. Stroke risk factors include cocaine use, hypertension, and hyperlipidemia. EXAM: MRA HEAD WITHOUT CONTRAST TECHNIQUE: Angiographic images of the Circle of Willis were obtained using MRA technique without intravenous contrast. COMPARISON:  MRI brain and MRV reported separately. FINDINGS: The internal carotid arteries are widely patent. Codominant vertebral arteries. Mild irregularity of the M1 segments of both middle cerebral arteries, non flow  reducing. Similar slight beading of the M2 and M3 [name]. No anterior cerebral artery disease of significance. Slight irregularity of the P1, P2, and P3 segments of the posterior cerebral arteries bilaterally. Same differential considerations. Moderately irregularity RIGHT superior cerebellar artery, LEFT anterior inferior cerebellar artery, and LEFT PICA. RIGHT AICA not visualized. There could be minor irregularity in the basilar artery proximal to the AICA. IMPRESSION: Mildly irregular intracranial [name] throughout, including M1 MCA segments, PCA segments, and cerebellar branches. These changes could be related to intracranial atherosclerotic disease or reflect nonspecific vasculitis, including drug induced. Electronically Signed   By: Staci Righter M.D.   On: 02/07/2015 20:33   Mr Brain Wo Contrast  02/07/2015  CLINICAL DATA:  45 year old female with transient period of unresponsiveness followed by right-sided weakness. Stroke Risk Factors include cocaine use, hyperlipidemia and hypertension. EXAM: MRI HEAD WITHOUT CONTRAST TECHNIQUE: Multiplanar, multiecho pulse sequences of the brain and surrounding structures were obtained without intravenous contrast. COMPARISON:  MRA and MRV reported separately. FINDINGS: No evidence for acute infarction, hemorrhage, mass lesion, hydrocephalus, or extra-axial fluid. Normal for age cerebral volume. Mild white matter disease, nonspecific, but could be related to chronic microvascular ischemic change. 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 Compensatory enlargement of the LEFT lateral ventricle (images 15 and 16, series 11 coronal T2) confirms chronicity. Partial empty sella. No tonsillar herniation. Visualized upper cervical region unremarkable. Flow voids are maintained in the carotid, basilar, and  vertebral arteries. No sinus or mastoid disease.  No orbital findings. Extracranial soft tissues grossly unremarkable. IMPRESSION: No acute intracranial abnormality. 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 See discussion above. Mild white matter disease, nonspecific. Electronically Signed   By: Staci Righter M.D.   On: 02/07/2015 20:25   Mr Annell Greening Head  02/07/2015  CLINICAL DATA:  Right-sided weakness. Evaluate for venous sinus thrombosis. EXAM: MR VENOGRAM   HEAD WITHOUT CONTRAST TECHNIQUE: Angiographic images of the intracranial venous structures were obtained using MRV technique without intravenous contrast. COMPARISON:  MRI brain and MRA intracranial reported separately. FINDINGS: The superior sagittal sinus is widely patent. The LEFT transverse sinus is dominant, accepting the drainage of the superior sagittal sinus. The RIGHT transverse sinus accepts the drainage of the deep venous system and is therefore smaller. Both internal cerebral veins, vein of Galen, and straight sinus are all widely patent. There is partial volume signal loss of the RIGHT transverse sinus which is otherwise show to be patent and free of thrombus on axial source images. Both internal soon jugular veins are patent, LEFT dominant. No cortical venous thrombosis is identified. IMPRESSION: No evidence for deep venous thrombosis. CONTRAST:  The patient received 20 mL MultiHance and vomited. Postcontrast imaging could not be performed. Electronically Signed   By: Staci Righter M.D.   On: 02/07/2015 20:37   Dg Fluoro Guide Lumbar Puncture  02/08/2015  CLINICAL DATA:  Patient complaining of headaches, vomiting for several months. EXAM: DIAGNOSTIC LUMBAR PUNCTURE UNDER FLUOROSCOPIC GUIDANCE FLUOROSCOPY TIME:  Radiation Exposure Index (as provided by the fluoroscopic device): 108.5 mGy per meter squared. If the device does not provide the exposure index: Fluoroscopy Time (in minutes and seconds):  0 minutes and 12 seconds Number of  Acquired Images:  1 PROCEDURE: Informed consent was obtained from the patient prior to the procedure, including potential complications of headache, allergy, and pain. With the patient prone, the lower back was prepped with Betadine. 1% Lidocaine was used for local anesthesia. Lumbar puncture was performed at the L3 level using a 20 gauge needle with return of clear CSF with an opening pressure of 30 cm water with a closing pressure of 8 cm of water. 20.5 ml of CSF were obtained for laboratory studies. The patient tolerated the procedure well and there were no apparent complications. IMPRESSION: 1. Successful fluoroscopically guided lumbar puncture. 2. Abnormal opening pressure of 30 cm of water. Electronically Signed   By: Lajean Manes M.D.   On: 02/08/2015 13:09    2D Echo: 02/08/15 Left ventricle: The cavity size was normal. Wall thickness was normal. Systolic function was normal. The estimated ejection fraction was in the range of 60% to 65%. Wall motion was normal; there were no regional wall motion abnormalities. Doppler parameters are consistent with abnormal left ventricular relaxation (grade 1 diastolic dysfunction). The E/e&' ratio is <8, suggesting normal LV filling pressure. - Aortic valve: Trileaflet. Sclerosis without stenosis. There was trivial regurgitation. - Left atrium: The atrium was normal in size.  Impressions:  - LVEF 60-65%, normal wall thickness, normal wall motion, diastolic dysfunction, trivial AI, normal LA size.   Admission HPI:  Denise [name] is a 45 year old with a PMH of HTN, uterine fibroids, and treated syphylis who presents after an episode in which she lost consciousness this morning. The patient could not remember much detail from the event, so her husband, [name], assisted with providing history. Reportedly, she woke up this morning and  had an episode of nausea and vomiting (clear emesis), which she does on most days. Then she  described a worsening occipital headache, chest, and abdominal pain, and cried out. She said the chest pain was in the center and non-radiating. Then she cried out, waking her husband who was sleeping, and he came over to find her on the floor. They did not know whether or not she hit her head. Thus husband said that she appeared "glassy-eyed," was trembling, and was unresponsive. The episode lasted for 5-10 minutes, and there was no bowel or bladder incontinence. She remained confused after the episode. She says he speech been slower since the episode and it is more difficult to speak. Her husband also notices that her face seems to droop on the left side intermittently for months. He reports 5 similar episodes since 2015, when they were in [place], in which she was unresponsive for 5-10 minutes, for which they received no additional workup. He says the episodes were also preceded by her saying, "I just don't feel right." She also complains of some new onset right-sided weakness and loss of sensation. Her last known normal was "sometime this morning."  Mr. Humphrey has also noticed an overall decline in the functioning of his wife over the last couple years. She's had worsening memory and concentration. She can bathe herself, but she minimally participates in other household work (cooking, cleaning), which is done by her husband. She spends most of her days watching cartoons. When asked about her mood, she says, "I don't know." The husband notices a significant, gradual changes in her personality over the past couple years.  Other chronic symptoms have included shortness of breath , but cannot describe if its worse at rest or with activity. She also has persistent hunger, and her husband says "she needs to eat something every 45 minutes or she gets moody." She repeatedly said during the interview, "I'm hungry. 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 She never noticed any symptoms of her syphilis - no rashes. She does endorse some urinary urgency throughout the day, but no dysuria. Otherwise, she denies fever, vision changes, vertigo, diarrhea, constipation, bleeding episodes, dark stool, rashes, joint pain, cold or heat intolerance.   She is in the process of establishing with a PCP in the area, and in fact had an appointment today to "adjust my blood pressure medication." Family history is positive for strokes on her maternal side, but no family history of seizures, early onset dementias. She smokes 1/2 ppd, drinks at least a 40 oz [employer] a day, and no drug use.   In the ED, her BP was 175/115, and her other vital signs were stable. UA, preg test, BMET, CBC were unremarkable. A CT head was obtained and demonstrated attenuation in the left anterior basal ganglia, which could represent an acute/recent infarct, a degree of empty sella, and calcifications in both carotids.  Hospital Course by problem list: Active Problems:   Syncope   Right sided weakness   1. Syncope: CT head w/ possible acute infarct in the anterior left basal ganglia. 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 MRV head was negative for DVT. Once stroke was ruled out her home lisinopril 20mg  BID was resumed. She was positive for syphilis in October 2016 and reported getting 3 once  weekly [facility]. Due to husbands reports of personality changes, child like behavior, and report staring spells lumbar puncture was obtained that was negative for neurosyphilis however CSF VDRL is still pending. Neurology was consulted on admission and noted inconsistent neuro exams and non physiologic overlay. EEG was negative and it was recommended that pt proceed w/ sleep deprived EEG. On the 2nd day of admission pt had a witness all over  shaking spell while working with physical therapy. The Internal Medicine team went to evaluate pt immediately and there was concern for pt being post ictal, keppra was considered but held off on starting. On the third day of admission patient was more alert and oriented, more engaged in her speech, and had 5/5 upper and lower extremity strength. She was requesting to go home and concerned about her husband who was currently in the ED. Informed that it was recommended that she stay another day and proceed w/ sleep deprived EEG especially in light of her recent shaking spell the previous day. She refused to stay and was seen ambulating on her own from the restroom. She states she just wanted to go home despite being counseled on risks of leaving against medical advice including stroke, recurrent seizure, and death. Possible ddx for her syncopal episodes include Todd paralysis, cocaine abuse, and facetious cause as she was not following commands when she had all over shaking but then when reach up to scratch forehead or adjust her bedsheets.   Discharge Vitals:   BP 141/71 mmHg  Pulse 74  Temp(Src) 97.7 F (36.5 C) (Oral)  Resp 18  Ht 5\' 4"  (1.626 m)  Wt 248 lb 8 oz (112.719 kg)  BMI 42.63 kg/m2  SpO2 95%  LMP 12/08/2014  Discharge Labs:  Results for orders placed or performed during the hospital encounter of 02/07/15 (from the past 24 hour(s))  Glucose, capillary     Status: Abnormal   Collection Time: 02/08/15  6:30 PM  Result Value Ref Range   Glucose-Capillary 115 (H) 65 - 99 mg/dL    Signed: Norman Herrlich, MD 02/09/2015, 3:37 PM

## 2015-02-09 NOTE — Progress Notes (Signed)
Patient refusing to stay for further work up. Patient ready to leave "right now". Patient signed AMA papers. Telemetry discontinued, IV removed intact. Pt wheeled to hospital entrance in wheelchair. Patient left with husband.

## 2015-02-09 NOTE — Progress Notes (Signed)
Subjective:  Pt states she is at her baseline and is ready to go home. Her husband is in the ED and she is eager to go see him there. Speaking in full clear sentences, more interactive than previous days.   Yesterday evening pt had an episode of all over trembling which self resolved. Covering IMTS team went to evaluate pt immediately and there was concern for pt being post ictal, keppra was considered but held off on starting.   Objective: Vital signs in last 24 hours: Filed Vitals:   02/09/15 0008 02/09/15 0200 02/09/15 0400 02/09/15 0600  BP: 137/76 157/90 124/94 141/71  Pulse: 76 75 76 74  Temp: 98.8 F (37.1 C)  97.7 F (36.5 C)   TempSrc:   Oral   Resp: 17 13 17 18   Height:      Weight:   248 lb 8 oz (112.719 kg)   SpO2: 96% 97% 95% 95%   Weight change: -2 lb 8 oz (-1.134 kg)  Intake/Output Summary (Last 24 hours) at 02/09/15 0911 Last data filed at 02/08/15 2200  Gross per 24 hour  Intake    340 ml  Output    400 ml  Net    -60 ml    Physical Exam: General: Sitting up in bed, not in any apparent distress  HEENT: EOMI, no scleral icterus Cardiovascular: Regular rate and rhythm. No murmurs, rubs, or gallops  Pulmonary: Clear to auscultation bilaterally. Unlabored breathing.  Abdominal: Obese, soft. Soft NT/ND. No masses or rebound.  Extremities: No clubbing, cyanosis, or edema.  Neurological: Alert and oriented x3. Speech normal. Intact sensation, 5/5 LE and UE strength. CN 2-12 intact.   Lab Results: Basic Metabolic Panel:  Recent Labs Lab 02/07/15 1354  NA 138  K 4.0  CL 105  CO2 22  GLUCOSE 106*  BUN <5*  CREATININE 0.73  CALCIUM 9.3   CBC:  Recent Labs Lab 02/07/15 1354  WBC 9.8  HGB 15.0  HCT 44.0  MCV 91.5  PLT 300   Cardiac Enzymes:  Recent Labs Lab 02/07/15 1735 02/07/15 2206 02/08/15 0422  TROPONINI <0.03 <0.03 <0.03   CBG:  Recent Labs Lab 02/07/15 1406 02/08/15 1830  GLUCAP 100* 115*   Hemoglobin A1C:  Recent  Labs Lab 02/07/15 1736  HGBA1C 5.8*   Fasting Lipid Panel:  Recent Labs Lab 02/08/15 0422  CHOL 262*  HDL 35*  LDLCALC 157*  TRIG 352*  CHOLHDL 7.5   Thyroid Function Tests:  Recent Labs Lab 02/07/15 1737  TSH 2.587   Urine Drug Screen: Drugs of Abuse     Component Value Date/Time   LABOPIA NONE DETECTED 02/07/2015 1819   COCAINSCRNUR POSITIVE* 02/07/2015 1819   LABBENZ NONE DETECTED 02/07/2015 1819   AMPHETMU NONE DETECTED 02/07/2015 1819   THCU NONE DETECTED 02/07/2015 1819   LABBARB NONE DETECTED 02/07/2015 1819    Alcohol Level:  Recent Labs Lab 02/07/15 1737  ETH <5   Urinalysis:  Recent Labs Lab 02/07/15 1504  COLORURINE AMBER*  LABSPEC 1.019  PHURINE 5.5  GLUCOSEU NEGATIVE  HGBUR NEGATIVE  BILIRUBINUR NEGATIVE  KETONESUR NEGATIVE  PROTEINUR NEGATIVE  NITRITE NEGATIVE  LEUKOCYTESUR NEGATIVE    Micro Results: Recent Results (from the past 240 hour(s))  CSF culture     Status: None (Preliminary result)   Collection Time: 02/08/15 12:35 PM  Result Value Ref Range Status   Specimen Description CSF  Final   Special Requests NONE  Final   Gram Stain NO WBC  SEEN NO ORGANISMS SEEN CYTOSPIN SMEAR   Final   Culture PENDING  Incomplete   Report Status PENDING  Incomplete   Studies/Results: Ct Head Wo Contrast  02/07/2015  CLINICAL DATA:  Seizure with syncopal episode.  Hypertension. EXAM: CT HEAD WITHOUT CONTRAST CT CERVICAL SPINE WITHOUT CONTRAST TECHNIQUE: Multidetector CT imaging of the head and cervical spine was performed following the standard protocol without intravenous contrast. Multiplanar CT image reconstructions of the cervical spine were also generated. COMPARISON:  None. FINDINGS: CT HEAD FINDINGS The ventricles are normal in size and configuration. There is mild invagination of CSF into the sella. There is no intracranial mass, hemorrhage, extra-axial fluid collection, or midline shift. There is a focal infarct involving the anterior  left basal ganglia with involvement of a portion of the head of the caudate on the left, a portion of the anterior limb of the left internal capsule, and a portion of the anterior aspect of the left lentiform nucleus. This focal area of may represent a recent/acute infarct. Elsewhere, gray-white compartments are normal. The bony calvarium appears intact. The mastoid air cells are clear. Visualized orbital regions appear symmetric and unremarkable. CT CERVICAL SPINE FINDINGS There is no fracture or spondylolisthesis. Prevertebral soft tissues and predental space regions are normal. There is mild disc space narrowing at C5-6, C6-7, and C7-T1. There is no nerve root edema or effacement. No disc extrusion or stenosis. There is calcification in each carotid artery. IMPRESSION: CT head: Decreased attenuation in the anterior left basal ganglia, a finding suspicious for recent/acute infarct in this area. Elsewhere gray-white compartments appear normal. No hemorrhage or well-defined mass. Mild degree of empty sella. CT cervical spine: Mild osteoarthritic change. No fracture or spondylolisthesis. Foci of carotid artery calcification on both sides. Electronically Signed   By: Lowella Grip III M.D.   On: 02/07/2015 14:35   Ct Cervical Spine Wo Contrast  02/07/2015  CLINICAL DATA:  Seizure with syncopal episode.  Hypertension. EXAM: CT HEAD WITHOUT CONTRAST CT CERVICAL SPINE WITHOUT CONTRAST TECHNIQUE: Multidetector CT imaging of the head and cervical spine was performed following the standard protocol without intravenous contrast. Multiplanar CT image reconstructions of the cervical spine were also generated. COMPARISON:  None. FINDINGS: CT HEAD FINDINGS The ventricles are normal in size and configuration. There is mild invagination of CSF into the sella. There is no intracranial mass, hemorrhage, extra-axial fluid collection, or midline shift. There is a focal infarct involving the anterior left basal ganglia with  involvement of a portion of the head of the caudate on the left, a portion of the anterior limb of the left internal capsule, and a portion of the anterior aspect of the left lentiform nucleus. This focal area of may represent a recent/acute infarct. Elsewhere, gray-white compartments are normal. The bony calvarium appears intact. The mastoid air cells are clear. Visualized orbital regions appear symmetric and unremarkable. CT CERVICAL SPINE FINDINGS There is no fracture or spondylolisthesis. Prevertebral soft tissues and predental space regions are normal. There is mild disc space narrowing at C5-6, C6-7, and C7-T1. There is no nerve root edema or effacement. No disc extrusion or stenosis. There is calcification in each carotid artery. IMPRESSION: CT head: Decreased attenuation in the anterior left basal ganglia, a finding suspicious for recent/acute infarct in this area. Elsewhere gray-white compartments appear normal. No hemorrhage or well-defined mass. Mild degree of empty sella. CT cervical spine: Mild osteoarthritic change. No fracture or spondylolisthesis. Foci of carotid artery calcification on both sides. Electronically Signed   By:  Lowella Grip III M.D.   On: 02/07/2015 14:35   Mr Jodene Nam Head Wo Contrast  02/07/2015  CLINICAL DATA:  Patient suffered episode of unresponsiveness, followed by right-sided weakness. Stroke risk factors include cocaine use, hypertension, and hyperlipidemia. EXAM: MRA HEAD WITHOUT CONTRAST TECHNIQUE: Angiographic images of the Circle of Willis were obtained using MRA technique without intravenous contrast. COMPARISON:  MRI brain and MRV reported separately. FINDINGS: The internal carotid arteries are widely patent. Codominant vertebral arteries. Mild irregularity of the M1 segments of both middle cerebral arteries, non flow reducing. Similar slight beading of the M2 and M3 vessels. No anterior cerebral artery disease of significance. Slight irregularity of the P1, P2, and P3  segments of the posterior cerebral arteries bilaterally. Same differential considerations. Moderately irregularity RIGHT superior cerebellar artery, LEFT anterior inferior cerebellar artery, and LEFT PICA. RIGHT AICA not visualized. There could be minor irregularity in the basilar artery proximal to the AICA. IMPRESSION: Mildly irregular intracranial vessels throughout, including M1 MCA segments, PCA segments, and cerebellar branches. These changes could be related to intracranial atherosclerotic disease or reflect nonspecific vasculitis, including drug induced. Electronically Signed   By: Staci Righter M.D.   On: 02/07/2015 20:33   Mr Brain Wo Contrast  02/07/2015  CLINICAL DATA:  45 year old female with transient period of unresponsiveness followed by right-sided weakness. Stroke Risk Factors include cocaine use, hyperlipidemia and hypertension. EXAM: MRI HEAD WITHOUT CONTRAST TECHNIQUE: Multiplanar, multiecho pulse sequences of the brain and surrounding structures were obtained without intravenous contrast. COMPARISON:  MRA and MRV reported separately. FINDINGS: No evidence for acute infarction, hemorrhage, mass lesion, hydrocephalus, or extra-axial fluid. Normal for age cerebral volume. Mild white matter disease, nonspecific, but could be related to chronic microvascular ischemic change. 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 Compensatory enlargement of the LEFT lateral ventricle (images 15 and 16, series 11 coronal T2) confirms chronicity. Partial empty sella. No tonsillar herniation. Visualized upper cervical region unremarkable. Flow voids are maintained in the carotid, basilar, and vertebral arteries. No sinus or mastoid disease. No orbital findings. Extracranial soft tissues grossly unremarkable. IMPRESSION: No acute intracranial abnormality. 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 See discussion above. Mild white matter disease, nonspecific. Electronically Signed   By: Staci Righter M.D.   On: 02/07/2015 20:25   Mr Annell Greening Head  02/07/2015  CLINICAL DATA:  Right-sided weakness. Evaluate for venous sinus thrombosis. EXAM: MR VENOGRAM   HEAD WITHOUT CONTRAST TECHNIQUE: Angiographic images of the intracranial venous structures were obtained using MRV technique without intravenous contrast. COMPARISON:  MRI brain and MRA intracranial reported separately. FINDINGS: The superior sagittal sinus is widely patent. The LEFT transverse sinus is dominant, accepting the drainage of the superior sagittal sinus. The RIGHT transverse sinus accepts the drainage of the deep venous system and is therefore smaller. Both internal cerebral veins, vein of Galen, and straight sinus are all widely patent. There is partial volume signal loss of the RIGHT transverse sinus which is otherwise show to be patent and free of thrombus on axial source images. Both internal soon jugular veins are patent, LEFT dominant. No cortical venous thrombosis is identified. IMPRESSION: No evidence for deep venous thrombosis. CONTRAST:  The patient received 20 mL MultiHance and vomited. Postcontrast imaging could not be performed. Electronically Signed   By: Staci Righter M.D.   On: 02/07/2015 20:37   Dg Fluoro Guide Lumbar Puncture  02/08/2015  CLINICAL DATA:  Patient complaining of headaches, vomiting for several months. EXAM: DIAGNOSTIC LUMBAR PUNCTURE UNDER FLUOROSCOPIC GUIDANCE FLUOROSCOPY TIME:  Radiation Exposure Index (as provided by the fluoroscopic device): 108.5 mGy per meter squared. If the device does not provide the exposure index: Fluoroscopy Time (in minutes and seconds):  0 minutes and 12 seconds Number of Acquired Images:  1 PROCEDURE: Informed consent was obtained from the patient prior to the procedure, including potential complications of headache, allergy,  and pain. With the patient prone, the lower back was prepped with Betadine. 1% Lidocaine was used for local anesthesia. Lumbar puncture was performed at the L3 level using a 20 gauge needle with return of clear CSF with an opening pressure of 30 cm water with a closing pressure of 8 cm of water. 20.5 ml of CSF were obtained for laboratory studies. The patient tolerated the procedure well and there were no apparent complications. IMPRESSION: 1. Successful fluoroscopically guided lumbar puncture. 2. Abnormal opening pressure of 30 cm of water. Electronically Signed   By: Lajean Manes M.D.   On: 02/08/2015 13:09   Medications: I have reviewed the patient's current medications. Scheduled Meds: . folic acid  1 mg Oral Daily  . lisinopril  20 mg Oral BID  . multivitamin with minerals  1 tablet Oral Daily  . [START ON 02/10/2015] pneumococcal 23 valent vaccine  0.5 mL Intramuscular Tomorrow-1000  . thiamine  100 mg Oral Daily   Continuous Infusions:  PRN Meds:.LORazepam **OR** LORazepam Assessment/Plan: Active Problems:   Syncope   Right sided weakness  Loss of Consciousness: Unclear cause, LP yesterday negative for neurosyphilis(WBC 1, protein 25) however CSF VDRL is still pending. EEG neg for seizure but does not r/o seizure d/o. ECHO EF 60-65%- does not mention PFO, neg for vegetations. Carotid dopplers prelim reading WNL. Today is more alert and interactive. States she is at baseline. Intact sensation and 5/5 UE and LE strength on neuro exam today. She is eager to go home. Possible ddx include Todd paralysis, cocaine abuse, and facetious cause as yesterday she was not following commands but then when reach up to scratch forehead or adjust her bedsheets. Discussed case w/ Dr. Belenda Cruise who would like to proceed w/ sleep deprivation EEG rather than commit pt to keppra at this time.  - neurology following, appreciate their help, sleep deprivation EEG - CSF VDRL pending  Alcohol Use Disorder: Last drink  was on 1/12. Denies any withdrawal symptoms. She may have had a withdrawal seizure, but chronicity makes less likely. - CIWA protocol - Thiamine, multivitamin, and folate   HTN: BP improved today, 141/71. - Lisinopril 20 mg BID  DVT Prophylaxis: SCDs only  Dispo: Disposition is deferred at this time, awaiting improvement of current medical problems. Anticipated discharge today or tomorrow.   The patient does have a current PCP (Dr. Leslie Andrea) and does not need an [facility] hospital follow-up appointment after discharge.  The patient does have transportation limitations that hinder transportation to clinic appointments.  .Services Needed at time of discharge: Y = Yes, Blank = No PT:   OT:   RN:   Equipment:   Other:       Norman Herrlich, MD 02/09/2015, 9:11 AM

## 2015-02-10 LAB — VDRL, CSF: SYPHILIS VDRL QUANT CSF: NONREACTIVE

## 2015-02-11 LAB — CSF CULTURE: CULTURE: NO GROWTH

## 2015-02-11 LAB — CSF CULTURE W GRAM STAIN: Gram Stain: NONE SEEN

## 2015-03-07 LAB — CULTURE, FUNGUS WITHOUT SMEAR

## 2015-03-28 ENCOUNTER — Ambulatory Visit: Payer: Medicaid Other | Admitting: Gastroenterology

## 2015-04-14 ENCOUNTER — Ambulatory Visit (INDEPENDENT_AMBULATORY_CARE_PROVIDER_SITE_OTHER): Payer: Medicaid Other | Admitting: Neurology

## 2015-04-14 ENCOUNTER — Encounter: Payer: Self-pay | Admitting: Neurology

## 2015-04-14 VITALS — BP 150/84 | HR 94 | Ht 64.0 in | Wt 252.0 lb

## 2015-04-14 DIAGNOSIS — R413 Other amnesia: Secondary | ICD-10-CM

## 2015-04-14 DIAGNOSIS — I10 Essential (primary) hypertension: Secondary | ICD-10-CM | POA: Diagnosis not present

## 2015-04-14 DIAGNOSIS — F32A Depression, unspecified: Secondary | ICD-10-CM

## 2015-04-14 DIAGNOSIS — Z7289 Other problems related to lifestyle: Secondary | ICD-10-CM

## 2015-04-14 DIAGNOSIS — R519 Headache, unspecified: Secondary | ICD-10-CM

## 2015-04-14 DIAGNOSIS — Z720 Tobacco use: Secondary | ICD-10-CM

## 2015-04-14 DIAGNOSIS — R51 Headache: Secondary | ICD-10-CM | POA: Diagnosis not present

## 2015-04-14 DIAGNOSIS — F329 Major depressive disorder, single episode, unspecified: Secondary | ICD-10-CM

## 2015-04-14 DIAGNOSIS — Z789 Other specified health status: Secondary | ICD-10-CM

## 2015-04-14 DIAGNOSIS — R55 Syncope and collapse: Secondary | ICD-10-CM

## 2015-04-14 HISTORY — DX: Other problems related to lifestyle: Z72.89

## 2015-04-14 HISTORY — DX: Other specified health status: Z78.9

## 2015-04-14 HISTORY — DX: Tobacco use: Z72.0

## 2015-04-14 NOTE — Patient Instructions (Signed)
We will check a 72 hour ambulatory EEG After EEG, will start a medication for headaches Follow up afterwards

## 2015-04-14 NOTE — Progress Notes (Signed)
NEUROLOGY CONSULTATION NOTE  Denise [name] MRN: [id number] DOB: 11/19/1970  Referring provider: Volney Presser, FNP Primary care provider: Volney Presser, FNP  Reason for consult:  spells  HISTORY OF PRESENT ILLNESS: Denise [name] is a 45 year old right-handed woman with hypertension, uterine fibroids and history of treated syphilis who presents for syncope.  History obtained by patient, her fiance and hospital notes.  Labs, echo/carotid doppler reports and imaging of brain MRI/MRA/MRV reviewed.  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  It is unknown when she got it.  Over the past couple of years, she has had a gradual decline with personality changes, and child-like behavior.  She also has She is often happy and will then become solemn.  In 2015, she had 4 or 5 episodes of passing out.  No cause was found.  She has spells when she suddenly experiences a bi-temporal/posterior headache followed by tremulousness and glassy look in her eyes.  She is not responsive.  This lasts for about 30 seconds and occurs about 3 times a week.  She is frequently hungry and gets flustered.  Hunger may trigger a spell.  On the morning of 02/07/15, she woke up and had an episode of nausea and vomiting.  She then experienced the habitual spells with onset of occipital head, chest and abdominal pain, which caused her to cry out.  Her husband woke up and found her on the floor.  As per her husband, she appeared "glassy-eyed", trembling and was unresponsive.  This lasted about 5-10 minutes.  She was confused afterwards for 20 to 30 minutes.  She did not have incontinence or bite her tongue.  She was admitted to [facility].  Blood pressure in the ED was 175/115.  Pregnancy test was negative.  CBC and UA revealed no evidence of infection.  BMET was unremarkable.  CT head demonstrated attenuation in the left anterior basal ganglia.  Follow up MRI of head revealed  chronic left basal ganglia hemorrhage.  MRA and MRV of head were unremarkable.  She underwent lumbar puncture, which was unremarkable.  Cell count was 1, glucose 71, protein 25, gram stain culture and fungal culture negative.  VDRL was negative for neurosyphilis.  2D echo showed EF 60-65% with grade 1 diastolic dysfunction.  Carotid doppler showed no hemodynamically significant stenosis.  She exhibited inconsistencies on neurologic exam.  Routine EEG was negative.  On the second day of admission, she had a witnessed spell in which she was shaking all over while undergoing physical therapy.  She appeared postictal, but she was not started on an AED.  It was recommended to stay for a sleep-deprived EEG but she left AMA.  She smokes  ppd.  She reportedly drinks at least a 40 oz [employer] a day.  She denies illicit drug use.  There is a history of stroke on her mother's side, but no family history of seizures or early-onset dementia.  PAST MEDICAL HISTORY: Past Medical History  Diagnosis Date  . Hypertension   . Anxiety   . Depression   . Hyperlipemia   . Overactive bladder     PAST SURGICAL HISTORY: Past Surgical History  Procedure Laterality Date  . Cesarean section      MEDICATIONS: Current Outpatient Prescriptions on File Prior to Visit  Medication Sig Dispense Refill  . lisinopril (PRINIVIL,ZESTRIL) 20 MG tablet Take 20 mg by mouth 2 (two) times daily.     No current facility-administered medications  on file prior to visit.    ALLERGIES: No Known Allergies  FAMILY HISTORY: Family History  Problem Relation Age of Onset  . Stroke Mother     SOCIAL HISTORY: Social History   Social History  . Marital Status: Married    Spouse Name: N/A  . Number of Children: 3  . Years of Education: N/A   Occupational History  . Not on file.   Social History Main Topics  . Smoking status: Current Every Day Smoker  . Smokeless tobacco: Never Used  . Alcohol Use: 0.0 oz/week    0  Standard drinks or equivalent per week     Comment: 2 a day  . Drug Use: No  . Sexual Activity: Yes    Birth Control/ Protection: None   Other Topics Concern  . Not on file   Social History Narrative    REVIEW OF SYSTEMS: Constitutional: No fevers, chills, or sweats, no generalized fatigue, change in appetite Eyes: No visual changes, double vision, eye pain Ear, nose and throat: No hearing loss, ear pain, nasal congestion, sore throat Cardiovascular: No chest pain, palpitations Respiratory:  No shortness of breath at rest or with exertion, wheezes GastrointestinaI: No nausea, vomiting, diarrhea, abdominal pain, fecal incontinence Genitourinary:  No dysuria, urinary retention or frequency Musculoskeletal:  No neck pain, back pain Integumentary: No rash, pruritus, skin lesions Neurological: as above Psychiatric: No depression, insomnia, anxiety Endocrine: No palpitations, fatigue, diaphoresis, mood swings, change in appetite, change in weight, increased thirst Hematologic/Lymphatic:  No anemia, purpura, petechiae. Allergic/Immunologic: no itchy/runny eyes, nasal congestion, recent allergic reactions, rashes  PHYSICAL EXAM: Filed Vitals:   04/14/15 1050  BP: 150/84  Pulse: 94   General: No acute distress.   Head:  Normocephalic/atraumatic Eyes:  fundi unremarkable, without vessel changes, exudates, hemorrhages or papilledema. Neck: supple, no paraspinal tenderness, full range of motion Back: No paraspinal tenderness Heart: regular rate and rhythm Lungs: Clear to auscultation bilaterally. Vascular: No carotid bruits. Neurological Exam: Mental status: alert and oriented to person, state (not town), and month, day of week and season (not date or year), recent and remote memory impaired, fund of knowledge deficient, attention and concentration impaired, speech fluent and not dysarthric, language intact. MMSE - Mini Mental State Exam 04/14/2015  Orientation to time 3  Orientation  to Place 2  Registration 3  Attention/ Calculation 0  Recall 0  Language- name 2 objects 2  Language- repeat 0  Language- follow 3 step command 3  Language- read & follow direction 1  Write a sentence 1  Copy design 0  Total score 15   Cranial nerves: CN I: not tested CN II: pupils equal, round and reactive to light, visual fields intact, fundi unremarkable, without vessel changes, exudates, hemorrhages or papilledema. CN III, IV, VI:  full range of motion, no nystagmus, no ptosis CN V: endorses reduced sensation on right V1-V3 CN VII: upper and lower face symmetric CN VIII: hearing intact CN IX, X: gag intact, uvula midline CN XI: sternocleidomastoid and trapezius muscles intact CN XII: tongue midline Bulk & Tone: normal, no fasciculations. Motor:  Decreased finger tapping speed and amplitude on the right.  Right hand grip 4+/5.  Otherwise 5/5 throughout Sensation:  Decreased pinprick sensation in right upper and lower extremities, vibration sensation intact. Deep Tendon Reflexes: 3+ right upper extremity, otherwise 2+,  toes downgoing.  Finger to nose testing:  Without dysmetria.  Heel to shin:  Without dysmetria.  Gait:  Normal station and stride.  Able  to turn.  Some unsteadiness with tandem.  Romberg negative. During the exam, she had one of her habitual spells.  She became glassy-eyed, however she did respond to my questioning.  It lasted 30 to 60 seconds.  She began to cry.  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  However, I cannot say for certain and therefore she requires further evaluation.  PLAN: 1.  Will get 72 hour ambulatory EEG to try and capture a spell 2.  After EEG, will start a medication  for headache.   3.  Smoking and alcohol cessation 4.  optimize blood pressure control.  Follow up recheck with PCP 5.  Smoking cessation 6.  Alcohol cessation.  Recommend thiamine 100mg  daily 7.  Follow up after testing  Thank you for allowing me to take part in the care of this patient.  Metta Clines, DO  CC:  Volney Presser, FNP

## 2015-04-14 NOTE — Progress Notes (Signed)
Chart forwarded.  

## 2015-04-16 ENCOUNTER — Emergency Department (HOSPITAL_COMMUNITY)
Admission: EM | Admit: 2015-04-16 | Discharge: 2015-04-16 | Disposition: A | Discharge status: Home / Self Care | Payer: Medicaid Other | Attending: Emergency Medicine | Admitting: Emergency Medicine

## 2015-04-16 ENCOUNTER — Emergency Department (HOSPITAL_COMMUNITY): Payer: Medicaid Other

## 2015-04-16 ENCOUNTER — Encounter (HOSPITAL_COMMUNITY): Payer: Self-pay | Admitting: Emergency Medicine

## 2015-04-16 DIAGNOSIS — Z8639 Personal history of other endocrine, nutritional and metabolic disease: Secondary | ICD-10-CM | POA: Insufficient documentation

## 2015-04-16 DIAGNOSIS — R29810 Facial weakness: Secondary | ICD-10-CM | POA: Diagnosis not present

## 2015-04-16 DIAGNOSIS — R4789 Other speech disturbances: Secondary | ICD-10-CM | POA: Diagnosis not present

## 2015-04-16 DIAGNOSIS — R4701 Aphasia: Secondary | ICD-10-CM | POA: Diagnosis present

## 2015-04-16 DIAGNOSIS — R531 Weakness: Secondary | ICD-10-CM | POA: Insufficient documentation

## 2015-04-16 DIAGNOSIS — R6889 Other general symptoms and signs: Secondary | ICD-10-CM

## 2015-04-16 DIAGNOSIS — Z8659 Personal history of other mental and behavioral disorders: Secondary | ICD-10-CM | POA: Insufficient documentation

## 2015-04-16 DIAGNOSIS — IMO0001 Reserved for inherently not codable concepts without codable children: Secondary | ICD-10-CM

## 2015-04-16 DIAGNOSIS — M6289 Other specified disorders of muscle: Secondary | ICD-10-CM

## 2015-04-16 DIAGNOSIS — Z8673 Personal history of transient ischemic attack (TIA), and cerebral infarction without residual deficits: Secondary | ICD-10-CM | POA: Insufficient documentation

## 2015-04-16 DIAGNOSIS — R0689 Other abnormalities of breathing: Secondary | ICD-10-CM | POA: Diagnosis not present

## 2015-04-16 DIAGNOSIS — Z87448 Personal history of other diseases of urinary system: Secondary | ICD-10-CM | POA: Insufficient documentation

## 2015-04-16 DIAGNOSIS — I10 Essential (primary) hypertension: Secondary | ICD-10-CM | POA: Insufficient documentation

## 2015-04-16 DIAGNOSIS — F17200 Nicotine dependence, unspecified, uncomplicated: Secondary | ICD-10-CM | POA: Diagnosis not present

## 2015-04-16 DIAGNOSIS — Z79899 Other long term (current) drug therapy: Secondary | ICD-10-CM | POA: Diagnosis not present

## 2015-04-16 DIAGNOSIS — R51 Headache: Secondary | ICD-10-CM | POA: Insufficient documentation

## 2015-04-16 HISTORY — DX: Other amnesia: R41.3

## 2015-04-16 HISTORY — DX: Transient cerebral ischemic attack, unspecified: G45.9

## 2015-04-16 HISTORY — DX: Cerebral infarction, unspecified: I63.9

## 2015-04-16 LAB — I-STAT CHEM 8, ED
BUN: 12 mg/dL (ref 6–20)
CALCIUM ION: 1.15 mmol/L (ref 1.12–1.23)
CHLORIDE: 103 mmol/L (ref 101–111)
Creatinine, Ser: 0.9 mg/dL (ref 0.44–1.00)
Glucose, Bld: 111 mg/dL — ABNORMAL HIGH (ref 65–99)
HCT: 45 % (ref 36.0–46.0)
Hemoglobin: 15.3 g/dL — ABNORMAL HIGH (ref 12.0–15.0)
Potassium: 3.7 mmol/L (ref 3.5–5.1)
SODIUM: 140 mmol/L (ref 135–145)
TCO2: 23 mmol/L (ref 0–100)

## 2015-04-16 LAB — URINALYSIS, ROUTINE W REFLEX MICROSCOPIC
GLUCOSE, UA: NEGATIVE mg/dL
Hgb urine dipstick: NEGATIVE
Ketones, ur: NEGATIVE mg/dL
LEUKOCYTES UA: NEGATIVE
Nitrite: NEGATIVE
PROTEIN: NEGATIVE mg/dL
SPECIFIC GRAVITY, URINE: 1.034 — AB (ref 1.005–1.030)
pH: 5.5 (ref 5.0–8.0)

## 2015-04-16 LAB — CBC
HCT: 40.6 % (ref 36.0–46.0)
Hemoglobin: 13.5 g/dL (ref 12.0–15.0)
MCH: 30.6 pg (ref 26.0–34.0)
MCHC: 33.3 g/dL (ref 30.0–36.0)
MCV: 92.1 fL (ref 78.0–100.0)
PLATELETS: 289 10*3/uL (ref 150–400)
RBC: 4.41 MIL/uL (ref 3.87–5.11)
RDW: 13.5 % (ref 11.5–15.5)
WBC: 10.2 10*3/uL (ref 4.0–10.5)

## 2015-04-16 LAB — RAPID URINE DRUG SCREEN, HOSP PERFORMED
Amphetamines: NOT DETECTED
BARBITURATES: NOT DETECTED
Benzodiazepines: NOT DETECTED
COCAINE: NOT DETECTED
OPIATES: NOT DETECTED
Tetrahydrocannabinol: NOT DETECTED

## 2015-04-16 LAB — COMPREHENSIVE METABOLIC PANEL
ALBUMIN: 3.7 g/dL (ref 3.5–5.0)
ALK PHOS: 66 U/L (ref 38–126)
ALT: 24 U/L (ref 14–54)
AST: 27 U/L (ref 15–41)
Anion gap: 10 (ref 5–15)
BUN: 9 mg/dL (ref 6–20)
CALCIUM: 9.6 mg/dL (ref 8.9–10.3)
CHLORIDE: 104 mmol/L (ref 101–111)
CO2: 23 mmol/L (ref 22–32)
CREATININE: 1.01 mg/dL — AB (ref 0.44–1.00)
GFR calc Af Amer: >60 mL/min (ref >60)
GFR calc non Af Amer: >60 mL/min (ref >60)
GLUCOSE: 115 mg/dL — AB (ref 65–99)
Potassium: 3.8 mmol/L (ref 3.5–5.1)
SODIUM: 137 mmol/L (ref 135–145)
Total Bilirubin: 0.5 mg/dL (ref 0.3–1.2)
Total Protein: 6.5 g/dL (ref 6.5–8.1)

## 2015-04-16 LAB — PROTIME-INR
INR: 0.89 (ref 0.00–1.49)
PROTHROMBIN TIME: 12.2 s (ref 11.6–15.2)

## 2015-04-16 LAB — DIFFERENTIAL
BASOS ABS: 0 10*3/uL (ref 0.0–0.1)
Basophils Relative: 0 %
Eosinophils Absolute: 0.2 10*3/uL (ref 0.0–0.7)
Eosinophils Relative: 2 %
LYMPHS PCT: 39 %
Lymphs Abs: 4 10*3/uL (ref 0.7–4.0)
MONO ABS: 0.8 10*3/uL (ref 0.1–1.0)
Monocytes Relative: 8 %
NEUTROS ABS: 5.2 10*3/uL (ref 1.7–7.7)
NEUTROS PCT: 51 %

## 2015-04-16 LAB — I-STAT TROPONIN, ED: Troponin i, poc: 0 ng/mL (ref 0.00–0.08)

## 2015-04-16 LAB — ETHANOL: Alcohol, Ethyl (B): <5 mg/dL (ref <5)

## 2015-04-16 LAB — APTT: APTT: 26 s (ref 24–37)

## 2015-04-16 NOTE — ED Notes (Addendum)
Rapid response nurse arrived , pt. on a monitor and continous pulse oximetry , neurologist at bedside , alert but remained aphasic with right arm/leg weakness.

## 2015-04-16 NOTE — ED Notes (Signed)
Transported to CT scan with RN ,.

## 2015-04-16 NOTE — ED Notes (Signed)
Dr. Nicole Kindred ( neurologist) arrived to evaluate pt.

## 2015-04-16 NOTE — ED Notes (Signed)
Returned from CT scan , pt. placed on a monitor/pulse oximetry , undressed pt. , plan/process explained to pt. and spouse . Repositioned for comfort . Denies pain /respirations unlabored .

## 2015-04-16 NOTE — ED Notes (Signed)
Pt transported to MRI 

## 2015-04-16 NOTE — ED Notes (Signed)
Phlebotomist at CT scan collecting blood specimen .

## 2015-04-16 NOTE — ED Notes (Signed)
Pt. arrived with EMS from home reports aphasia and right arm weakness/numbness today , LSN 1300 , transported to CT scan at arrival .

## 2015-04-16 NOTE — ED Notes (Signed)
Dr. Paris Lore at bedside explaining tests results and plan of care to pt. and family.

## 2015-04-16 NOTE — ED Notes (Signed)
Paged chaplain for family 

## 2015-04-16 NOTE — ED Provider Notes (Signed)
CSN: [id number]     Arrival date & time 04/16/15  2010 History   First MD Initiated Contact with Patient 04/16/15 2028     Chief Complaint  Patient presents with  . Aphasia     (Consider location/radiation/quality/duration/timing/severity/associated sxs/prior Treatment) Patient is a 45 y.o. female presenting with neurologic complaint. The history is provided by the patient and the spouse.  Neurologic Problem This is a recurrent problem. The current episode started today (1:00PM). The problem occurs constantly. The problem has been gradually improving. Associated symptoms include headaches and weakness. Pertinent negatives include no abdominal pain, arthralgias, chest pain, chills, coughing, diaphoresis, fatigue, fever, myalgias, nausea, rash, sore throat or vomiting. Associated symptoms comments: Headache, right sided weakness. Exacerbated by: unknown. She has tried nothing for the symptoms.    Past Medical History  Diagnosis Date  . Hypertension   . Anxiety   . Depression   . Hyperlipemia   . Overactive bladder   . Stroke ([place])   . TIA (transient ischemic attack)   . Memory changes    Past Surgical History  Procedure Laterality Date  . Cesarean section     Family History  Problem Relation Age of Onset  . Stroke Mother    Social History  Substance Use Topics  . Smoking status: Current Every Day Smoker  . Smokeless tobacco: Never Used  . Alcohol Use: 0.0 oz/week    0 Standard drinks or equivalent per week     Comment: 2 a day   OB History    Gravida Para Term Preterm AB TAB SAB Ectopic Multiple Living   3 3        3      Review of Systems  Constitutional: Negative for fever, chills, diaphoresis, activity change, appetite change and fatigue.  HENT: Negative for facial swelling, rhinorrhea, sore throat, trouble swallowing and voice change.   Eyes: Negative for photophobia, pain and visual disturbance.  Respiratory: Negative for cough, shortness of breath, wheezing and  stridor.   Cardiovascular: Negative for chest pain, palpitations and leg swelling.  Gastrointestinal: Negative for nausea, vomiting, abdominal pain, constipation and anal bleeding.  Endocrine: Negative.   Genitourinary: Negative for dysuria, vaginal bleeding, vaginal discharge and vaginal pain.  Musculoskeletal: Negative for myalgias, back pain and arthralgias.  Skin: Negative.  Negative for rash.  Allergic/Immunologic: Negative.   Neurological: Positive for facial asymmetry, speech difficulty, weakness and headaches. Negative for dizziness, tremors and syncope.  Psychiatric/Behavioral: Negative for suicidal ideas, sleep disturbance and self-injury.  All other systems reviewed and are negative.     Allergies  Review of patient's allergies indicates no known allergies.  Home Medications   Prior to Admission medications   Medication Sig Start Date End Date Taking? Authorizing Provider  amLODipine (NORVASC) 5 MG tablet Take 5 mg by mouth daily.  03/15/15  Yes Historical Provider, MD  lisinopril (PRINIVIL,ZESTRIL) 20 MG tablet Take 20 mg by mouth 2 (two) times daily.   Yes Historical Provider, MD  omeprazole (PRILOSEC) 20 MG capsule take 1 capsule by mouth once daily before meals 02/26/15  Yes Historical Provider, MD  triamterene-hydrochlorothiazide (MAXZIDE-25) 37.5-25 MG tablet Take 0.5 tablets by mouth daily. 03/26/15  Yes Historical Provider, MD   BP 124/62 mmHg  Pulse 78  Temp(Src) 98.3 F (36.8 C) (Oral)  Resp 18  SpO2 97% Physical Exam  Constitutional: She appears well-developed and well-nourished. No distress.  HENT:  Head: Normocephalic and atraumatic.  Right Ear: External ear normal.  Left Ear: External ear normal.  Nose: Nose normal.  Eyes: EOM are normal. Pupils are equal, round, and reactive to light.  Tracks with eyes  Neck: Normal range of motion. No JVD present.  Cardiovascular: Normal rate and regular rhythm.   No murmur heard. Pulmonary/Chest: Effort normal and  breath sounds normal.  Abdominal: Soft. Bowel sounds are normal.  Musculoskeletal: Normal range of motion. She exhibits no edema.  Neurological:  Alert. Oriented to place and year but not to person. Can correctly name her husbands name and her neices name but not her own name. 5/5 strength in left upper and lower extremity. 4/5 strength in right upper and lower extremity. Sensation intact throughout. Voice normal.   Skin: Skin is warm and dry. She is not diaphoretic.    ED Course  Procedures (including critical care time) Labs Review Labs Reviewed  COMPREHENSIVE METABOLIC PANEL - Abnormal; Notable for the following:    Glucose, Bld 115 (*)    Creatinine, Ser 1.01 (*)    All other components within normal limits  URINALYSIS, ROUTINE W REFLEX MICROSCOPIC (NOT AT [facility]) - Abnormal; Notable for the following:    APPearance CLOUDY (*)    Specific Gravity, Urine 1.034 (*)    Bilirubin Urine SMALL (*)    All other components within normal limits  I-STAT CHEM 8, ED - Abnormal; Notable for the following:    Glucose, Bld 111 (*)    Hemoglobin 15.3 (*)    All other components within normal limits  ETHANOL  PROTIME-INR  APTT  CBC  DIFFERENTIAL  URINE RAPID DRUG SCREEN, HOSP PERFORMED  I-STAT TROPOININ, ED    Imaging Review Ct Head Wo Contrast  04/16/2015  CLINICAL DATA:  Aphasia of acute onset EXAM: CT HEAD WITHOUT CONTRAST TECHNIQUE: Contiguous axial images were obtained from the base of the skull through the vertex without intravenous contrast. COMPARISON:  Head CT February 07, 2015; brain MRI February 07, 2015 FINDINGS: There is no ventriculomegaly. There is mild enlargement of the frontal horn the left lateral ventricle, a stable finding due to localized encephalomalacia. There is evidence of a prior infarct involving the head of the caudate nucleus on the left as well as a portion of the anterior limb of the left internal capsule and the anterior left lentiform nucleus, stable. This infarct  is immediately adjacent to the frontal horn of the left lateral ventricle. There is no acute hemorrhage. There is no mass effect, extra-axial fluid collection, or midline shift. Elsewhere gray-white compartments appear normal. Middle cerebral artery attenuation is normal bilaterally. The bony calvarium appears intact. The mastoid air cells are clear. There is mild invagination of CSF into the sella. IMPRESSION: Prior infarct in the left basal ganglia region anteriorly causing localized enlargement of the frontal horn of the left lateral ventricle, stable. No new gray-white compartment lesion. No acute hemorrhage or mass. Mild degree of empty sella. Critical Value/emergent results were called by telephone at the time of interpretation on 04/16/2015 at 8:26 pm to Dr. Nicole Kindred, neurology , who verbally acknowledged these results. Electronically Signed   By: Lowella Grip III M.D.   On: 04/16/2015 20:27   Mr Brain Wo Contrast  04/16/2015  CLINICAL DATA:  New onset a aphasia and right upper extremity weakness and numbness today. Last seen normal 8 hours ago. EXAM: MRI HEAD WITHOUT CONTRAST TECHNIQUE: Multiplanar, multiecho pulse sequences of the brain and surrounding structures were obtained without intravenous contrast. COMPARISON:  CT head without contrast from the same day. MRI brain 02/07/2015. FINDINGS: The diffusion-weighted  images demonstrate no evidence for acute or subacute infarction. A stable remote hemorrhagic lacunar infarct is present in the anterior left basal ganglia. There is some white matter change extending into the left corona radiata. A punctate T2 focus is present in the right lateral pons. This is stable. No acute hemorrhage or mass lesion is present. The ventricles are of normal size. No significant extra-axial fluid collection is present. The internal auditory canals are normal bilaterally. The brainstem and cerebellum are normal. Flow is present in the major intracranial arteries. The  globes orbits are intact. The paranasal sinuses and mastoid air cells are clear. Skullbase is normal.  Midline sagittal images are unremarkable. IMPRESSION: 1. Stable remote hemorrhagic infarct in the anterior left basal ganglia. 2. Associated white matter changes in the left coronal radiata. 3. Punctate T2 focus in the right lateral pons may be related to the more superior infarct. 4. No acute intracranial abnormality or significant interval change. Go Electronically Signed   By: San Morelle M.D.   On: 04/16/2015 21:31   I have personally reviewed and evaluated these images and lab results as part of my medical decision-making.   EKG Interpretation   Date/Time:  Wednesday April 16 2015 20:25:38 EDT Ventricular Rate:  75 PR Interval:  160 QRS Duration: 86 QT Interval:  406 QTC Calculation: 453 R Axis:   43 Text Interpretation:  Sinus rhythm Probable left atrial enlargement  Probable anteroseptal infarct, old Borderline T abnormalities, inferior  leads No significant change since last tracing Confirmed by LITTLE MD,  RACHEL [phone]) on 04/16/2015 9:19:41 PM      MDM   Final diagnoses:  Weakness  Spells ([place])    The patient is a 45 year old female with a history of right-sided weakness and headaches who presents as a code stroke. Of note the patient has suffered from spells in the past where she is partially responsive and has speech. She is been worked up these in the past with MRIs and admission. She sees neurology as an outpatient for this. Patient has been reports that at 1 PM today she suffered a headache and had a spell of decreased speech and right-sided weakness worse than normal. Neurology evaluates the patient on arrival. MRI brain is ordered which does not show any acute changes from previous exams. Please see neurology's note for more detail. Neurology does not feel the patient has organic or neurologic disorder and does not recommend any admission or further workup in  the emergency department. Based on the recommendations, negative laboratory workup, negative imaging, and patient's mental status changes I have recommended psychiatric consult in the emergency department. The patient refuses a stating she does not wish to speak to psychiatry at this time. She contracts for safety denies any SI or HI. Denies any hallucinations. I feel she has capacity to make this decision. Patient is discharged with information for outpatient psychiatric follow-up as well as previously scheduled neurology follow-up. She had an EEG scheduled for April strongly encouraged to show to his appointment as well as presented emergency department in the meantime for any worsening or concerning symptoms. Patient verses understanding and agreement with this plan.  Patient seen with attending, Dr. Rex Kras, who oversaw clinical decision making.   Margaretann Loveless, MD 04/16/15 [place], MD 04/19/15 [phone]

## 2015-04-16 NOTE — Consult Note (Signed)
Admission H&P    Chief Complaint: Acute right-sided weakness and facial droop.  HPI: Denise [name] is an 45 y.o. female hypertension, hyperlipidemia, depression, anxiety and alcohol abuse, presenting with complaint of facial droop on the right and speech difficulty as well as right extremity weakness. She reportedly was last known well at 1:00 PM today. She's had similar presentations, including admission for stroke workup in January 2017, which was negative including no acute stroke on MRI with persistent apparent right-sided weakness. She's also being evaluated for possible epileptic disorder versus psychogenic spells. She is scheduled for 72 hours EEG in April 2017. She is currently on no antiepileptic medication. CT scan of her head showed an old left basal ganglion infarct with no acute findings seen. Findings on examination was felt to be largely psychogenic. Patient was out of the window for consideration for treatment with TPA, as well. MRI of the brain without contrast no acute intracranial abnormality.  Past Medical History  Diagnosis Date  . Hypertension   . Anxiety   . Depression   . Hyperlipemia   . Overactive bladder     Past Surgical History  Procedure Laterality Date  . Cesarean section      Family History  Problem Relation Age of Onset  . Stroke Mother    Social History:  reports that she has been smoking.  She has never used smokeless tobacco. She reports that she drinks alcohol. She reports that she does not use illicit drugs.  Allergies: No Known Allergies  Medications: Patient's preadmission medications were reviewed by me.  ROS: History obtained from spouse.  General ROS: negative for - chills, fatigue, fever, night sweats, weight gain or weight loss Psychological ROS: negative for - behavioral disorder, hallucinations, memory difficulties, mood swings or suicidal ideation Ophthalmic ROS: negative for - blurry vision, double vision, eye pain or loss  of vision ENT ROS: negative for - epistaxis, nasal discharge, oral lesions, sore throat, tinnitus or vertigo Allergy and Immunology ROS: negative for - hives or itchy/watery eyes Hematological and Lymphatic ROS: negative for - bleeding problems, bruising or swollen lymph nodes Endocrine ROS: negative for - galactorrhea, hair pattern changes, polydipsia/polyuria or temperature intolerance Respiratory ROS: negative for - cough, hemoptysis, shortness of breath or wheezing Cardiovascular ROS: negative for - chest pain, dyspnea on exertion, edema or irregular heartbeat Gastrointestinal ROS: negative for - abdominal pain, diarrhea, hematemesis, nausea/vomiting or stool incontinence Genito-Urinary ROS: negative for - dysuria, hematuria, incontinence or urinary frequency/urgency Musculoskeletal ROS: negative for - joint swelling or muscular weakness Neurological ROS: as noted in HPI Dermatological ROS: negative for rash and skin lesion changes  Physical Examination: Blood pressure 128/88, pulse 78, temperature 98.3 F (36.8 C), temperature source Oral, resp. rate 17, SpO2 96 %.  HEENT-  Normocephalic, no lesions, without obvious abnormality.  Normal external eye and conjunctiva.  Normal TM's bilaterally.  Normal auditory canals and external ears. Normal external nose, mucus membranes and septum.  Normal pharynx. Neck supple with no masses, nodes, nodules or enlargement. Cardiovascular - regular rate and rhythm, S1, S2 normal, no murmur, click, rub or gallop Lungs - chest clear, no wheezing, rales, normal symmetric air entry Abdomen - soft, non-tender; bowel sounds normal; no masses,  no organomegaly Extremities - no joint deformities, effusion, or inflammation and no edema  Neurologic Examination: Mental Status: Alert, refused to answer questions, anxious and frequently tearful.  Able to follow commands with use of left extremities. Patient refuses to move right extremities voluntarily. Cranial  Nerves: II-Visual  fields were normal. III/IV/VI-Pupils were equal and reacted normally to light. Extraocular movements were full and conjugate.    VVII-nonphysiologic facial symmetry, with no right facial weakness demonstrated with facial expressions. VIII-normal. X-no dysarthria. Motor: Normal strength and tone of left extremities; patient refuses to move right extremities voluntarily. Deep Tendon Reflexes: 1+ and symmetric. Plantars: Mute bilaterally Carotid auscultation: Normal  Results for orders placed or performed during the hospital encounter of 04/16/15 (from the past 48 hour(s))  Ethanol     Status: None   Collection Time: 04/16/15  8:19 PM  Result Value Ref Range   Alcohol, Ethyl (B) <5 <5 mg/dL    Comment:        LOWEST DETECTABLE LIMIT FOR SERUM ALCOHOL IS 5 mg/dL FOR MEDICAL PURPOSES ONLY   Protime-INR     Status: None   Collection Time: 04/16/15  8:19 PM  Result Value Ref Range   Prothrombin Time 12.2 11.6 - 15.2 seconds   INR 0.89 0.00 - 1.49  APTT     Status: None   Collection Time: 04/16/15  8:19 PM  Result Value Ref Range   aPTT 26 24 - 37 seconds  CBC     Status: None   Collection Time: 04/16/15  8:19 PM  Result Value Ref Range   WBC 10.2 4.0 - 10.5 K/uL   RBC 4.41 3.87 - 5.11 MIL/uL   Hemoglobin 13.5 12.0 - 15.0 g/dL   HCT 40.6 36.0 - 46.0 %   MCV 92.1 78.0 - 100.0 fL   MCH 30.6 26.0 - 34.0 pg   MCHC 33.3 30.0 - 36.0 g/dL   RDW 13.5 11.5 - 15.5 %   Platelets 289 150 - 400 K/uL  Differential     Status: None   Collection Time: 04/16/15  8:19 PM  Result Value Ref Range   Neutrophils Relative % 51 %   Neutro Abs 5.2 1.7 - 7.7 K/uL   Lymphocytes Relative 39 %   Lymphs Abs 4.0 0.7 - 4.0 K/uL   Monocytes Relative 8 %   Monocytes Absolute 0.8 0.1 - 1.0 K/uL   Eosinophils Relative 2 %   Eosinophils Absolute 0.2 0.0 - 0.7 K/uL   Basophils Relative 0 %   Basophils Absolute 0.0 0.0 - 0.1 K/uL  Comprehensive metabolic panel     Status: Abnormal    Collection Time: 04/16/15  8:19 PM  Result Value Ref Range   Sodium 137 135 - 145 mmol/L   Potassium 3.8 3.5 - 5.1 mmol/L   Chloride 104 101 - 111 mmol/L   CO2 23 22 - 32 mmol/L   Glucose, Bld 115 (H) 65 - 99 mg/dL   BUN 9 6 - 20 mg/dL   Creatinine, Ser 1.01 (H) 0.44 - 1.00 mg/dL   Calcium 9.6 8.9 - 10.3 mg/dL   Total Protein 6.5 6.5 - 8.1 g/dL   Albumin 3.7 3.5 - 5.0 g/dL   AST 27 15 - 41 U/L   ALT 24 14 - 54 U/L   Alkaline Phosphatase 66 38 - 126 U/L   Total Bilirubin 0.5 0.3 - 1.2 mg/dL   GFR calc non Af Amer >60 >60 mL/min   GFR calc Af Amer >60 >60 mL/min    Comment: (NOTE) The eGFR has been calculated using the CKD EPI equation. This calculation has not been validated in all clinical situations. eGFR's persistently <60 mL/min signify possible Chronic Kidney Disease.    Anion gap 10 5 - 15  I-stat troponin, ED (not  at [facility], [facility])     Status: None   Collection Time: 04/16/15  8:19 PM  Result Value Ref Range   Troponin i, poc 0.00 0.00 - 0.08 ng/mL   Comment 3            Comment: Due to the release kinetics of cTnI, a negative result within the first hours of the onset of symptoms does not rule out myocardial infarction with certainty. If myocardial infarction is still suspected, repeat the test at appropriate intervals.   I-Stat Chem 8, ED  (not at [facility], [facility])     Status: Abnormal   Collection Time: 04/16/15  8:21 PM  Result Value Ref Range   Sodium 140 135 - 145 mmol/L   Potassium 3.7 3.5 - 5.1 mmol/L   Chloride 103 101 - 111 mmol/L   BUN 12 6 - 20 mg/dL   Creatinine, Ser 0.90 0.44 - 1.00 mg/dL   Glucose, Bld 111 (H) 65 - 99 mg/dL   Calcium, Ion 1.15 1.12 - 1.23 mmol/L   TCO2 23 0 - 100 mmol/L   Hemoglobin 15.3 (H) 12.0 - 15.0 g/dL   HCT 45.0 36.0 - 46.0 %   Ct Head Wo Contrast  04/16/2015  CLINICAL DATA:  Aphasia of acute onset EXAM: CT HEAD WITHOUT CONTRAST TECHNIQUE: Contiguous axial images were obtained from the base of the skull through the vertex without  intravenous contrast. COMPARISON:  Head CT February 07, 2015; brain MRI February 07, 2015 FINDINGS: There is no ventriculomegaly. There is mild enlargement of the frontal horn the left lateral ventricle, a stable finding due to localized encephalomalacia. There is evidence of a prior infarct involving the head of the caudate nucleus on the left as well as a portion of the anterior limb of the left internal capsule and the anterior left lentiform nucleus, stable. This infarct is immediately adjacent to the frontal horn of the left lateral ventricle. There is no acute hemorrhage. There is no mass effect, extra-axial fluid collection, or midline shift. Elsewhere gray-white compartments appear normal. Middle cerebral artery attenuation is normal bilaterally. The bony calvarium appears intact. The mastoid air cells are clear. There is mild invagination of CSF into the sella. IMPRESSION: Prior infarct in the left basal ganglia region anteriorly causing localized enlargement of the frontal horn of the left lateral ventricle, stable. No new gray-white compartment lesion. No acute hemorrhage or mass. Mild degree of empty sella. Critical Value/emergent results were called by telephone at the time of interpretation on 04/16/2015 at 8:26 pm to Dr. Nicole Kindred, neurology , who verbally acknowledged these results. Electronically Signed   By: Lowella Grip III M.D.   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 Psychogenic etiology for patient's current presentation is also suspected.   Recommendations: No further neurodiagnostic studies nor neurological intervention indicated at this point. Recommend antiplatelet therapy stroke prevention since patient has no risk  factors for stroke. Consider psychiatric intervention in patient's symptoms persist.  C.R. Nicole Kindred, [facility] [phone]  04/16/2015, 9:19 PM

## 2015-04-17 NOTE — Progress Notes (Signed)
RN called chaplain to support husband while wife was in [place].    When notified that chaplain was on the way, husband became upset and was not interested in chaplalin visit.  Indicated that he had called his own pastoral support.  RN notified chaplain upon arrival that she was not welcome.  Rev. [place], [place]

## 2015-05-06 ENCOUNTER — Telehealth: Payer: Self-pay | Admitting: Gastroenterology

## 2015-05-06 ENCOUNTER — Ambulatory Visit (INDEPENDENT_AMBULATORY_CARE_PROVIDER_SITE_OTHER): Payer: Medicaid Other | Admitting: Gastroenterology

## 2015-05-06 ENCOUNTER — Encounter: Payer: Self-pay | Admitting: Gastroenterology

## 2015-05-06 VITALS — BP 138/84 | HR 60 | Ht 64.0 in | Wt 251.0 lb

## 2015-05-06 DIAGNOSIS — R1032 Left lower quadrant pain: Secondary | ICD-10-CM

## 2015-05-06 DIAGNOSIS — K5900 Constipation, unspecified: Secondary | ICD-10-CM | POA: Diagnosis not present

## 2015-05-06 MED ORDER — TRAMADOL HCL 50 MG PO TABS: 50.0000 mg | ORAL_TABLET | Freq: Two times a day (BID) | ORAL | Status: DC

## 2015-05-06 NOTE — Patient Instructions (Addendum)
You have been scheduled for a colonoscopy. Please follow written instructions given to you at your visit today.  Please pick up your prep supplies at the pharmacy within the next 1-3 days. If you use inhalers (even only as needed), please bring them with you on the day of your procedure. Your physician has requested that you go to www.startemmi.com and enter the access code given to you at your visit today. This web site gives a general overview about your procedure. However, you should still follow specific instructions given to you by our office regarding your preparation for the procedure.  You have been scheduled for a CT scan of the abdomen and pelvis at [facility] ([address]---this is in the same building as Press [facility]).   You are scheduled on 05-08-2015 at 3:00pm. You should arrive 15 minutes prior to your appointment time for registration. Please follow the written instructions below on the day of your exam:  WARNING: IF YOU ARE ALLERGIC TO IODINE/X-RAY DYE, PLEASE NOTIFY RADIOLOGY IMMEDIATELY AT [phone]! YOU WILL BE GIVEN A 13 HOUR PREMEDICATION PREP.  1) Do not eat or drink anything after 11am (4 hours prior to your test) 2) You have been given 2 bottles of oral contrast to drink. The solution may taste               better if refrigerated, but do NOT add ice or any other liquid to this solution. Shake             well before drinking.    Drink 1 bottle of contrast @ 1:00pm (2 hours prior to your exam)  Drink 1 bottle of contrast @ 2:00pm (1 hour prior to your exam)  You may take any medications as prescribed with a small amount of water except for the following: Metformin, Glucophage, Glucovance, Avandamet, Riomet, Fortamet, Actoplus Met, Janumet, Glumetza or Metaglip. The above medications must be held the day of the exam AND 48 hours after the exam.  The purpose of you drinking the oral contrast is to aid in the visualization of your intestinal tract.  The contrast solution may cause some diarrhea. Before your exam is started, you will be given a small amount of fluid to drink. Depending on your individual set of symptoms, you may also receive an intravenous injection of x-ray contrast/dye. Plan on being at [facility] for 30 minutes or longer, depending on the type of exam you are having performed.  This test typically takes 30-45 minutes to complete.  If you have any questions regarding your exam or if you need to reschedule, you may call the CT department at [phone] between the hours of 8:00 am and 5:00 pm, Monday-Friday.  ________________________________________________________________________  If you are age 29 or older, your body mass index should be between 23-30. Your Body mass index is 43.06 kg/(m^2). If this is out of the aforementioned range listed, please consider follow up with your Primary Care Provider.  If you are age 52 or younger, your body mass index should be between 19-25. Your Body mass index is 43.06 kg/(m^2). If this is out of the aformentioned range listed, please consider follow up with your Primary Care Provider.   We have sent the following medications to your pharmacy for you to pick up at your convenience: Tramadol  Thank you for choosing [facility]  Dr Wilfrid Lund III

## 2015-05-06 NOTE — Telephone Encounter (Signed)
Faxed as required.

## 2015-05-06 NOTE — Telephone Encounter (Signed)
Pharmacy called and needs it faxed to [phone] or verbally

## 2015-05-06 NOTE — Progress Notes (Signed)
[facility] Consult Note:  History: Denise [name] 05/06/2015  Referring physician: Boyce Medici, FNP  Reason for consult/chief complaint: Abdominal Pain   Subjective HPI:  This is the initial office consult for this pleasant woman referred to me for abdominal pain and constipation. She is with her husband today. Shylie reports at least 6 months of daily crampy severe lower abdominal pain with associated flatulence and constipation. She says she passes gas 15 or 20 times per day, and her husband says she even does so in her sleep. She has typically moving her bowels, is not clear what she may have taken to try and relieve this. She was seen in the ED last October, ultrasound suggested uterine fibroids, so she was seen by gynecology. She had no other gynecologic symptoms, so she was referred to urology for some urinary frequency. He recently saw her, needed no further workup and referred her to [place] for the GI symptoms. She takes up to 9 ibuprofen a day for the abdominal pain. She denies weight loss or rectal bleeding.  ROS:  Review of Systems  Constitutional: Negative for appetite change and unexpected weight change.  HENT: Negative for mouth sores and voice change.   Eyes: Negative for pain and redness.  Respiratory: Negative for cough and shortness of breath.   Cardiovascular: Negative for chest pain and palpitations.  Genitourinary: Negative for dysuria and hematuria.  Musculoskeletal: Negative for myalgias and arthralgias.  Skin: Negative for pallor and rash.  Neurological: Positive for headaches. Negative for weakness.  Hematological: Negative for adenopathy.   Leane was admitted to [facility] in January of this year for a probable syncopal event. She has an upcoming EEG after her recent consultation with the neurologist.  Past Medical History: Past Medical History  Diagnosis Date  . Hypertension   . Anxiety   . Depression   . Hyperlipemia   .  Overactive bladder   . Stroke ([place])   . TIA (transient ischemic attack)   . Memory changes      Past Surgical History: Past Surgical History  Procedure Laterality Date  . Cesarean section       Family History: Family History  Problem Relation Age of Onset  . Stroke Mother    Father - ? Colon/rectum surgery for unclear reasons Social History: Social History   Social History  . Marital Status: Married    Spouse Name: N/A  . Number of Children: 3  . Years of Education: N/A   Social History Main Topics  . Smoking status: Current Every Day Smoker  . Smokeless tobacco: Never Used  . Alcohol Use: No     Comment: 2 a day  . Drug Use: Yes    Special: "Crack" cocaine  . Sexual Activity: Yes    Birth Control/ Protection: None   Other Topics Concern  . None   Social History Narrative    Allergies: No Known Allergies  Outpatient Meds: Current Outpatient Prescriptions  Medication Sig Dispense Refill  . amLODipine (NORVASC) 5 MG tablet Take 5 mg by mouth daily.   0  . lisinopril (PRINIVIL,ZESTRIL) 20 MG tablet Take 20 mg by mouth 2 (two) times daily.    Marland Kitchen omeprazole (PRILOSEC) 20 MG capsule take 1 capsule by mouth once daily before meals  0  . polyethylene glycol (MIRALAX / GLYCOLAX) packet Take 17 g by mouth daily.    Marland Kitchen triamcinolone cream (KENALOG) 0.1 % apply to affected area ONE TO TWO TIMES DAILY FOR NO MORE THAN  2 WEEKS  0  . triamterene-hydrochlorothiazide (MAXZIDE-25) 37.5-25 MG tablet Take 0.5 tablets by mouth daily.  0  . traMADol (ULTRAM) 50 MG tablet Take 1 tablet (50 mg total) by mouth 2 (two) times daily. 60 tablet 0   No current facility-administered medications for this visit.      ___________________________________________________________________ Objective  Exam:  BP 138/84 mmHg  Pulse 60  Ht 5\' 4"  (1.626 m)  Wt 251 lb (113.853 kg)  BMI 43.06 kg/m2  LMP 03/12/2015   General: this is a(n) Morbidly obese middle-aged African-American woman  no acute distress   Eyes: sclera anicteric, no redness  ENT: oral mucosa moist without lesions, no cervical or supraclavicular lymphadenopathy, good dentition  CV: RRR with a soft systolic murmur, [id number], no JVD, no peripheral edema  Resp: clear to auscultation bilaterally, normal RR and effort noted  GI: soft, moderate epigastric and left sided  tenderness, with active bowel sounds. No guarding or palpable organomegaly noted.  Skin; warm and dry, no rash or jaundice noted.  She has hair on the chest  Neuro: awake, alert and oriented x 3. Normal gross motor function and fluent speech  Labs:  Lab Results  Component Value Date   WBC 10.2 04/16/2015   HGB 15.3* 04/16/2015   HCT 45.0 04/16/2015   MCV 92.1 04/16/2015   PLT 289 04/16/2015    CMP     Component Value Date/Time   NA 140 04/16/2015 2021   K 3.7 04/16/2015 2021   CL 103 04/16/2015 2021   CO2 23 04/16/2015 2019   GLUCOSE 111* 04/16/2015 2021   BUN 12 04/16/2015 2021   CREATININE 0.90 04/16/2015 2021   CALCIUM 9.6 04/16/2015 2019   PROT 6.5 04/16/2015 2019   ALBUMIN 3.7 04/16/2015 2019   AST 27 04/16/2015 2019   ALT 24 04/16/2015 2019   ALKPHOS 66 04/16/2015 2019   BILITOT 0.5 04/16/2015 2019   GFRNONAA >60 04/16/2015 2019   GFRAA >60 04/16/2015 2019   No TSH  Radiologic Studies:  Uterine fibroids 0ct 2016 Assessment: Encounter Diagnoses  Name Primary?  Marland Kitchen LLQ abdominal pain Yes  . Constipation, unspecified constipation type     Difficult to tell exactly was bothering her, but it is causing pain daily a significant change in bowel habits, and impacting her life. I'm concerned about possible obstruction such as malignancy or perhaps chronic diverticulitis.  Plan:  CT scan abdomen and pelvis with and the next week, colonoscopy to follow shortly thereafter.  Thank you for the courtesy of this consult.  Please call me with any questions or concerns.  Nelida Meuse III

## 2015-05-08 ENCOUNTER — Ambulatory Visit
Admission: RE | Admit: 2015-05-08 | Discharge: 2015-05-08 | Disposition: A | Discharge status: Home / Self Care | Payer: Medicaid Other | Source: Ambulatory Visit | Attending: Gastroenterology | Admitting: Gastroenterology

## 2015-05-08 ENCOUNTER — Encounter (HOSPITAL_COMMUNITY): Payer: Self-pay

## 2015-05-08 ENCOUNTER — Other Ambulatory Visit (HOSPITAL_COMMUNITY): Payer: Self-pay | Admitting: Gastroenterology

## 2015-05-08 ENCOUNTER — Other Ambulatory Visit: Payer: Self-pay

## 2015-05-08 ENCOUNTER — Other Ambulatory Visit: Payer: Self-pay | Admitting: Gastroenterology

## 2015-05-08 ENCOUNTER — Ambulatory Visit (HOSPITAL_COMMUNITY)
Admission: RE | Admit: 2015-05-08 | Discharge: 2015-05-08 | Disposition: A | Discharge status: Home / Self Care | Payer: Medicaid Other | Source: Ambulatory Visit | Attending: Gastroenterology | Admitting: Gastroenterology

## 2015-05-08 DIAGNOSIS — R1032 Left lower quadrant pain: Secondary | ICD-10-CM | POA: Insufficient documentation

## 2015-05-08 DIAGNOSIS — K5900 Constipation, unspecified: Secondary | ICD-10-CM

## 2015-05-08 DIAGNOSIS — N329 Bladder disorder, unspecified: Secondary | ICD-10-CM | POA: Insufficient documentation

## 2015-05-08 DIAGNOSIS — D259 Leiomyoma of uterus, unspecified: Secondary | ICD-10-CM | POA: Insufficient documentation

## 2015-05-08 DIAGNOSIS — N912 Amenorrhea, unspecified: Secondary | ICD-10-CM

## 2015-05-08 LAB — PREGNANCY, URINE: PREG TEST UR: NEGATIVE

## 2015-05-08 MED ORDER — IOPAMIDOL (ISOVUE-300) INJECTION 61%
INTRAVENOUS | Status: AC
  Administered 2015-05-08: 100 mL
  Filled 2015-05-08: qty 100

## 2015-05-09 ENCOUNTER — Ambulatory Visit (HOSPITAL_COMMUNITY): Payer: Medicaid Other

## 2015-05-09 ENCOUNTER — Other Ambulatory Visit: Payer: Self-pay | Admitting: Gastroenterology

## 2015-05-09 ENCOUNTER — Ambulatory Visit (HOSPITAL_COMMUNITY)
Admission: RE | Admit: 2015-05-09 | Discharge: 2015-05-09 | Disposition: A | Discharge status: Home / Self Care | Payer: Medicaid Other | Source: Ambulatory Visit | Attending: Gastroenterology | Admitting: Gastroenterology

## 2015-05-09 DIAGNOSIS — K5730 Diverticulosis of large intestine without perforation or abscess without bleeding: Secondary | ICD-10-CM | POA: Diagnosis not present

## 2015-05-09 DIAGNOSIS — K760 Fatty (change of) liver, not elsewhere classified: Secondary | ICD-10-CM | POA: Diagnosis not present

## 2015-05-09 DIAGNOSIS — R1032 Left lower quadrant pain: Secondary | ICD-10-CM | POA: Diagnosis not present

## 2015-05-09 DIAGNOSIS — K449 Diaphragmatic hernia without obstruction or gangrene: Secondary | ICD-10-CM | POA: Insufficient documentation

## 2015-05-09 DIAGNOSIS — D259 Leiomyoma of uterus, unspecified: Secondary | ICD-10-CM | POA: Diagnosis not present

## 2015-05-12 ENCOUNTER — Ambulatory Visit (INDEPENDENT_AMBULATORY_CARE_PROVIDER_SITE_OTHER): Payer: Medicaid Other | Admitting: Neurology

## 2015-05-12 DIAGNOSIS — R55 Syncope and collapse: Secondary | ICD-10-CM

## 2015-05-13 MED ORDER — IOPAMIDOL (ISOVUE-370) INJECTION 76%
100.0000 mL | Freq: Once | INTRAVENOUS | Status: AC | PRN
  Administered 2015-05-09: 100 mL via INTRAVENOUS

## 2015-05-19 NOTE — Procedures (Signed)
[name] REPORT  Date of Study: 05/12/2015 to 05/15/2015  Patient's Name: Denise [name] MRN: [id number] Date of Birth: 1970/12/10  Indication: 45 year old woman with headaches and recurrent transient loss of awareness  Medications: lisinopril  Technical Summary: This is a multichannel digital EEG recording, using the international 10-20 placement system with electrodes applied with paste and impedances below 5000 ohms.    Description: The EEG background is symmetric, with a well-developed posterior dominant rhythm of 9-10 Hz, which is reactive to eye opening and closing.  Diffuse beta activity is seen, with a bilateral frontal preponderance.  No focal or generalized abnormalities are seen.  No focal or generalized epileptiform discharges are seen.  She did not demonstrate her habitual spells.  Stage II sleep is seen, with normal and symmetric sleep patterns.  Hyperventilation and photic stimulation were not performed.  ECG revealed normal cardiac rate and rhythm.  Impression: This is a normal 72 hour ambulatory EEG of the awake and asleep states.  As this study did not capture one of her habitual spells,  the possibility of a seizure disorder in this patient cannot be ruled out.  Adam R. Tomi Likens, DO

## 2015-05-20 ENCOUNTER — Telehealth: Payer: Self-pay

## 2015-05-20 MED ORDER — TOPIRAMATE 50 MG PO TABS: ORAL_TABLET | ORAL | Status: DC

## 2015-05-20 NOTE — Telephone Encounter (Signed)
-----   Message from Pieter Partridge, DO sent at 05/20/2015  7:15 AM EDT ----- EEG was normal.  I would like to start a medication for headaches.  I would like to start topiramate 25mg  at bedtime for 7 days, then 50mg  at bedtime.    Possible side effects include: impaired thinking, sedation, paresthesias (numbness and tingling) and weight loss.  It may cause dehydration and there is a small risk for kidney stones, so make sure to stay hydrated with water during the day.  There is also a very small risk for glaucoma, so if you notice any change in your vision while taking this medication, see an ophthalmologist.   She should call in 5 weeks with update and we can increase dose if headaches are still uncontrolled.

## 2015-05-20 NOTE — Telephone Encounter (Signed)
Pt aware. Medication sent in.

## 2015-05-27 ENCOUNTER — Encounter: Payer: Medicaid Other | Admitting: Gastroenterology

## 2015-07-04 ENCOUNTER — Encounter: Payer: Self-pay | Admitting: Gastroenterology

## 2015-07-04 ENCOUNTER — Ambulatory Visit (AMBULATORY_SURGERY_CENTER): Payer: Medicaid Other | Admitting: Gastroenterology

## 2015-07-04 VITALS — BP 155/96 | HR 86 | Temp 98.0°F | Resp 14 | Ht 64.0 in | Wt 251.0 lb

## 2015-07-04 DIAGNOSIS — R1032 Left lower quadrant pain: Secondary | ICD-10-CM | POA: Diagnosis not present

## 2015-07-04 MED ORDER — SODIUM CHLORIDE 0.9 % IV SOLN: 500.0000 mL | INTRAVENOUS | Status: DC

## 2015-07-04 NOTE — Progress Notes (Signed)
Patient awakening,vss,report to rn 

## 2015-07-04 NOTE — Op Note (Signed)
[facility] Patient Name: Denise [name] Procedure Date: 07/04/2015 1:28 PM MRN: [id number] Endoscopist: Mallie Mussel L. Loletha Carrow , MD Age: 45 Referring MD:  Date of Birth: 10/03/1970 Gender: Female Procedure:                Colonoscopy Indications:              Lower abdominal pain, Constipation Medicines:                Monitored Anesthesia Care Procedure:                Pre-Anesthesia Assessment:                           - Prior to the procedure, a History and Physical                            was performed, and patient medications and                            allergies were reviewed. The patient's tolerance of                            previous anesthesia was also reviewed. The risks                            and benefits of the procedure and the sedation                            options and risks were discussed with the patient.                            All questions were answered, and informed consent                            was obtained. Prior Anticoagulants: The patient has                            taken no previous anticoagulant or antiplatelet                            agents. ASA Grade Assessment: III - A patient with                            severe systemic disease. After reviewing the risks                            and benefits, the patient was deemed in                            satisfactory condition to undergo the procedure.                           After obtaining informed consent, the colonoscope  was passed under direct vision. Throughout the                            procedure, the patient's blood pressure, pulse, and                            oxygen saturations were monitored continuously. The                            Model CF-HQ190L [id number]) scope was introduced                            through the anus and advanced to the the cecum,                            identified by appendiceal orifice and  ileocecal                            valve. The colonoscopy was performed without                            difficulty. The patient tolerated the procedure                            well. The quality of the bowel preparation was                            good. The ileocecal valve, appendiceal orifice, and                            rectum were photographed. The bowel preparation                            used was Miralax. Scope In: 1:36:56 PM Scope Out: 1:46:37 PM Scope Withdrawal Time: 0 hours 7 minutes 21 seconds  Total Procedure Duration: 0 hours 9 minutes 41 seconds  Findings:                 The perianal and digital rectal examinations were                            normal.                           Multiple diverticula were found in the left colon.                           The exam was otherwise without abnormality on                            direct and retroflexion views. Complications:            No immediate complications. Estimated Blood Loss:     Estimated blood loss: none. Impression:               - Diverticulosis in the left colon.                           -  The examination was otherwise normal on direct                            and retroflexion views.                           - No specimens collected.                           No visible cause for abdominal pain seen on                            colonoscopy or recent CT scan abdomen/pelvis. Recommendation:           - Patient has a contact number available for                            emergencies. The signs and symptoms of potential                            delayed complications were discussed with the                            patient. Return to normal activities tomorrow.                            Written discharge instructions were provided to the                            patient.                           - Resume previous diet.                           - Continue present medications.                            - Repeat colonoscopy in 10 years for screening                            purposes.                           - miralax 1 capful once daily for constipation Henry L. Loletha Carrow, MD 07/04/2015 1:53:48 PM This report has been signed electronically.

## 2015-07-04 NOTE — Patient Instructions (Signed)
YOU HAD AN ENDOSCOPIC PROCEDURE TODAY AT [facility]:   Refer to the procedure report that was given to you for any specific questions about what was found during the examination.  If the procedure report does not answer your questions, please call your gastroenterologist to clarify.  If you requested that your care partner not be given the details of your procedure findings, then the procedure report has been included in a sealed envelope for you to review at your convenience later.  YOU SHOULD EXPECT: Some feelings of bloating in the abdomen. Passage of more gas than usual.  Walking can help get rid of the air that was put into your GI tract during the procedure and reduce the bloating. If you had a lower endoscopy (such as a colonoscopy or flexible sigmoidoscopy) you may notice spotting of blood in your stool or on the toilet paper. If you underwent a bowel prep for your procedure, you may not have a normal bowel movement for a few days.  Please Note:  You might notice some irritation and congestion in your nose or some drainage.  This is from the oxygen used during your procedure.  There is no need for concern and it should clear up in a day or so.  SYMPTOMS TO REPORT IMMEDIATELY:   Following lower endoscopy (colonoscopy or flexible sigmoidoscopy):  Excessive amounts of blood in the stool  Significant tenderness or worsening of abdominal pains  Swelling of the abdomen that is new, acute  Fever of 100F or higher   For urgent or emergent issues, a gastroenterologist can be reached at any hour by calling [phone].   DIET: Your first meal following the procedure should be a small meal and then it is ok to progress to your normal diet. Heavy or fried foods are harder to digest and may make you feel nauseous or bloated.  Likewise, meals heavy in dairy and vegetables can increase bloating.  Drink plenty of fluids but you should avoid alcoholic beverages for 24  hours.  ACTIVITY:  You should plan to take it easy for the rest of today and you should NOT DRIVE or use heavy machinery until tomorrow (because of the sedation medicines used during the test).    FOLLOW UP: Our staff will call the number listed on your records the next business day following your procedure to check on you and address any questions or concerns that you may have regarding the information given to you following your procedure. If we do not reach you, we will leave a message.  However, if you are feeling well and you are not experiencing any problems, there is no need to return our call.  We will assume that you have returned to your regular daily activities without incident.  If any biopsies were taken you will be contacted by phone or by letter within the next 1-3 weeks.  Please call us at [phone] if you have not heard about the biopsies in 3 weeks.    SIGNATURES/CONFIDENTIALITY: You and/or your care partner have signed paperwork which will be entered into your electronic medical record.  These signatures attest to the fact that that the information above on your After Visit Summary has been reviewed and is understood.  Full responsibility of the confidentiality of this discharge information lies with you and/or your care-partner.  Follow up 10 years 2027 Miralax 1 capful daily for constipation.  Diverticulosis and high fiber diet information given.

## 2015-07-07 ENCOUNTER — Telehealth: Payer: Self-pay

## 2015-07-07 NOTE — Telephone Encounter (Signed)
  Follow up Call-  Call back number 07/04/2015  Post procedure Call Back phone  # [phone]  Permission to leave phone message Yes     Patient questions:  Do you have a fever, pain , or abdominal swelling? No. Pain Score  0 *  Have you tolerated food without any problems? Yes.    Have you been able to return to your normal activities? Yes.    Do you have any questions about your discharge instructions: Diet   No. Medications  No. Follow up visit  No.  Do you have questions or concerns about your Care? No.  Actions: * If pain score is 4 or above: No action needed, pain <4.

## 2015-08-12 ENCOUNTER — Emergency Department (HOSPITAL_COMMUNITY): Payer: Medicaid Other

## 2015-08-12 ENCOUNTER — Encounter (HOSPITAL_COMMUNITY): Payer: Self-pay | Admitting: Emergency Medicine

## 2015-08-12 ENCOUNTER — Emergency Department (HOSPITAL_COMMUNITY)
Admission: EM | Admit: 2015-08-12 | Discharge: 2015-08-12 | Disposition: A | Discharge status: Home / Self Care | Payer: Medicaid Other | Attending: Emergency Medicine | Admitting: Emergency Medicine

## 2015-08-12 DIAGNOSIS — R079 Chest pain, unspecified: Secondary | ICD-10-CM | POA: Diagnosis not present

## 2015-08-12 DIAGNOSIS — Z79899 Other long term (current) drug therapy: Secondary | ICD-10-CM | POA: Diagnosis not present

## 2015-08-12 DIAGNOSIS — E785 Hyperlipidemia, unspecified: Secondary | ICD-10-CM | POA: Diagnosis not present

## 2015-08-12 DIAGNOSIS — R404 Transient alteration of awareness: Secondary | ICD-10-CM | POA: Insufficient documentation

## 2015-08-12 DIAGNOSIS — R4182 Altered mental status, unspecified: Secondary | ICD-10-CM | POA: Diagnosis present

## 2015-08-12 DIAGNOSIS — Z8673 Personal history of transient ischemic attack (TIA), and cerebral infarction without residual deficits: Secondary | ICD-10-CM | POA: Diagnosis not present

## 2015-08-12 DIAGNOSIS — I10 Essential (primary) hypertension: Secondary | ICD-10-CM | POA: Insufficient documentation

## 2015-08-12 DIAGNOSIS — F17200 Nicotine dependence, unspecified, uncomplicated: Secondary | ICD-10-CM | POA: Diagnosis not present

## 2015-08-12 DIAGNOSIS — F329 Major depressive disorder, single episode, unspecified: Secondary | ICD-10-CM | POA: Insufficient documentation

## 2015-08-12 LAB — COMPREHENSIVE METABOLIC PANEL
ALT: 44 U/L (ref 14–54)
AST: 46 U/L — AB (ref 15–41)
Albumin: 4.2 g/dL (ref 3.5–5.0)
Alkaline Phosphatase: 83 U/L (ref 38–126)
Anion gap: 9 (ref 5–15)
BILIRUBIN TOTAL: 1.1 mg/dL (ref 0.3–1.2)
BUN: 9 mg/dL (ref 6–20)
CHLORIDE: 109 mmol/L (ref 101–111)
CO2: 20 mmol/L — ABNORMAL LOW (ref 22–32)
CREATININE: 0.81 mg/dL (ref 0.44–1.00)
Calcium: 9.4 mg/dL (ref 8.9–10.3)
GFR calc Af Amer: >60 mL/min (ref >60)
Glucose, Bld: 104 mg/dL — ABNORMAL HIGH (ref 65–99)
Potassium: 4.6 mmol/L (ref 3.5–5.1)
Sodium: 138 mmol/L (ref 135–145)
TOTAL PROTEIN: 7.7 g/dL (ref 6.5–8.1)

## 2015-08-12 LAB — DIFFERENTIAL
BASOS ABS: 0 10*3/uL (ref 0.0–0.1)
Basophils Relative: 0 %
Eosinophils Absolute: 0.2 10*3/uL (ref 0.0–0.7)
Eosinophils Relative: 1 %
LYMPHS ABS: 3.9 10*3/uL (ref 0.7–4.0)
LYMPHS PCT: 36 %
MONOS PCT: 6 %
Monocytes Absolute: 0.6 10*3/uL (ref 0.1–1.0)
NEUTROS ABS: 6.3 10*3/uL (ref 1.7–7.7)
Neutrophils Relative %: 57 %

## 2015-08-12 LAB — URINALYSIS, ROUTINE W REFLEX MICROSCOPIC
BILIRUBIN URINE: NEGATIVE
GLUCOSE, UA: NEGATIVE mg/dL
Hgb urine dipstick: NEGATIVE
KETONES UR: NEGATIVE mg/dL
LEUKOCYTES UA: NEGATIVE
NITRITE: NEGATIVE
PH: 5.5 (ref 5.0–8.0)
Protein, ur: NEGATIVE mg/dL
SPECIFIC GRAVITY, URINE: 1.018 (ref 1.005–1.030)

## 2015-08-12 LAB — I-STAT CHEM 8, ED
BUN: 9 mg/dL (ref 6–20)
CHLORIDE: 108 mmol/L (ref 101–111)
CREATININE: 0.8 mg/dL (ref 0.44–1.00)
Calcium, Ion: 1.13 mmol/L (ref 1.13–1.30)
Glucose, Bld: 106 mg/dL — ABNORMAL HIGH (ref 65–99)
HEMATOCRIT: 46 % (ref 36.0–46.0)
Hemoglobin: 15.6 g/dL — ABNORMAL HIGH (ref 12.0–15.0)
Potassium: 4.5 mmol/L (ref 3.5–5.1)
Sodium: 140 mmol/L (ref 135–145)
TCO2: 24 mmol/L (ref 0–100)

## 2015-08-12 LAB — CBC
HEMATOCRIT: 42.9 % (ref 36.0–46.0)
HEMOGLOBIN: 14.6 g/dL (ref 12.0–15.0)
MCH: 31.7 pg (ref 26.0–34.0)
MCHC: 34 g/dL (ref 30.0–36.0)
MCV: 93.1 fL (ref 78.0–100.0)
Platelets: 331 10*3/uL (ref 150–400)
RBC: 4.61 MIL/uL (ref 3.87–5.11)
RDW: 13.8 % (ref 11.5–15.5)
WBC: 11.1 10*3/uL — AB (ref 4.0–10.5)

## 2015-08-12 LAB — ETHANOL: Alcohol, Ethyl (B): 14 mg/dL — ABNORMAL HIGH (ref <5)

## 2015-08-12 LAB — I-STAT TROPONIN, ED
TROPONIN I, POC: 0 ng/mL (ref 0.00–0.08)
TROPONIN I, POC: 0 ng/mL (ref 0.00–0.08)

## 2015-08-12 LAB — RAPID URINE DRUG SCREEN, HOSP PERFORMED
Amphetamines: NOT DETECTED
BARBITURATES: NOT DETECTED
BENZODIAZEPINES: NOT DETECTED
Cocaine: NOT DETECTED
Opiates: NOT DETECTED
Tetrahydrocannabinol: NOT DETECTED

## 2015-08-12 LAB — TROPONIN I: Troponin I: <0.03 ng/mL (ref <0.03)

## 2015-08-12 LAB — PROTIME-INR
INR: 1.01 (ref 0.00–1.49)
Prothrombin Time: 13.1 seconds (ref 11.6–15.2)

## 2015-08-12 LAB — APTT: aPTT: 26 seconds (ref 24–37)

## 2015-08-12 MED ORDER — HYDROXYZINE HCL 25 MG PO TABS
50.0000 mg | ORAL_TABLET | Freq: Once | ORAL | Status: DC
  Filled 2015-08-12: qty 2

## 2015-08-12 NOTE — ED Notes (Signed)
Pt in CT scan at this time

## 2015-08-12 NOTE — ED Notes (Signed)
Dr.Horton at bedside to reevaluate pt. Spouse at bedside.

## 2015-08-12 NOTE — ED Notes (Signed)
Per spouse pt reported that she was having chest pain around 2000 on 08/10/15 and took 325mg  ASA at home. At appx 2300 pt continued with increasing chest pain after waking up from sleep then proceed to have syncopal episode and currently can not respond verbally. Pt able to follow commands with gripping hands but unable to show any facial movement. Prior hx of CVA 01/2015.

## 2015-08-12 NOTE — ED Provider Notes (Signed)
CSN: [id number]     Arrival date & time 08/12/15  0044 History  By signing my name below, I, Ephriam Jenkins, attest that this documentation has been prepared under the direction and in the presence of Merryl Hacker, MD. Electronically signed, Ephriam Jenkins, ED Scribe. 08/12/2015. 1:02 AM.   Chief Complaint  Patient presents with  . Chest Pain  . Altered Mental Status   The history is provided by a relative. No language interpreter was used.   HPI Comments: LEVEL 5 CAVEAT DUE TO ACUITY OF CONDITION Denise [name] is a 45 y.o. female, brought in by relative, who presents to the Emergency Department s/p an episode of AMS that occurred this evening approximately one hour ago.. Per relative pt was complaining of chest pain prior to episode and was last reported acting normally at 2000. Pt's relative states that she has a Hx of similar episodes; this is the third instance. Pt had a stroke in January 2017 and saw Dr. Loretta Plume. Relative states that in past episodes she had similar symptoms of speech difficulty and weakness to both sides of her body; right worse than left.   Patient is noncontributory to history taking. She has had 2 previous evaluations for similar presentation. She's had 2 MRIs since January that have been negative and an EEG that has been negative as well. She has a history of latent syphilis.  Past Medical History  Diagnosis Date  . Hypertension   . Anxiety   . Depression   . Hyperlipemia   . Overactive bladder   . Stroke ([place])   . TIA (transient ischemic attack)   . Memory changes   . Blood transfusion without reported diagnosis   . Substance abuse    Past Surgical History  Procedure Laterality Date  . Cesarean section     Family History  Problem Relation Age of Onset  . Stroke Mother   . Heart disease Maternal Grandmother    Social History  Substance Use Topics  . Smoking status: Current Every Day Smoker  . Smokeless tobacco: Never Used  . Alcohol Use: No      Comment: 2 a day   OB History    Gravida Para Term Preterm AB TAB SAB Ectopic Multiple Living   3 3        3      Review of Systems  Unable to perform ROS: Acuity of condition      Allergies  Review of patient's allergies indicates no known allergies.  Home Medications   Prior to Admission medications   Medication Sig Start Date End Date Taking? Authorizing Provider  amLODipine (NORVASC) 5 MG tablet Take 5 mg by mouth daily.  03/15/15  Yes Historical Provider, MD  atorvastatin (LIPITOR) 10 MG tablet Take 10 mg by mouth at bedtime. 06/29/15  Yes Historical Provider, MD  lisinopril (PRINIVIL,ZESTRIL) 20 MG tablet Take 20 mg by mouth 2 (two) times daily.   Yes Historical Provider, MD  polyethylene glycol powder (GLYCOLAX/MIRALAX) powder TAKE 17 GRAMS MIXED WITH 8 OZ WATER, JUICE, SODA, COFFEE, OR TEA AND DRINK 1 TO 2 TIMES DAILY 06/26/15  Yes Historical Provider, MD  topiramate (TOPAMAX) 50 MG tablet topiramate 25mg  (1/2 tablet) at bedtime for 7 days, then 50mg  at bedtime. Patient not taking: Reported on 08/12/2015 05/20/15   Pieter Partridge, DO  traMADol (ULTRAM) 50 MG tablet Take 1 tablet (50 mg total) by mouth 2 (two) times daily. Patient not taking: Reported on 07/04/2015 05/06/15   Estill Cotta  Danis III, MD   BP 143/87 mmHg  Pulse 79  Temp(Src) 99 F (37.2 C) (Oral)  Resp 15  Ht 5\' 4"  (1.626 m)  Wt 247 lb (112.038 kg)  BMI 42.38 kg/m2  SpO2 92% Physical Exam  Constitutional: She is oriented to person, place, and time. No distress.  Nonverbal  HENT:  Head: Normocephalic and atraumatic.  Mouth/Throat: Oropharynx is clear and moist.  Eyes: Pupils are equal, round, and reactive to light.  Pupils 3 mm and reactive bilaterally  Cardiovascular: Normal rate, regular rhythm and normal heart sounds.   No murmur heard. Pulmonary/Chest: Effort normal and breath sounds normal. No respiratory distress. She has no wheezes.  Abdominal: Soft. There is no tenderness.  Musculoskeletal: She  exhibits edema.  Neurological: She is alert and oriented to person, place, and time.  Neurologic status difficult to assess, she is awake, she does not answer questions, she appears to move all 4 extremities but will not follow commands, no facial droop noted  Skin: Skin is warm and dry.  Psychiatric:  Unable to assess  Nursing note and vitals reviewed.   ED Course  Procedures  DIAGNOSTIC STUDIES: Oxygen Saturation is 95% on RA, adequate by my interpretation.  COORDINATION OF CARE: 1:02 AM-Will order labs and imaging. Discussed treatment plan with pt at bedside and pt agreed to plan.   Labs Review Labs Reviewed  ETHANOL - Abnormal; Notable for the following:    Alcohol, Ethyl (B) 14 (*)    All other components within normal limits  CBC - Abnormal; Notable for the following:    WBC 11.1 (*)    All other components within normal limits  COMPREHENSIVE METABOLIC PANEL - Abnormal; Notable for the following:    CO2 20 (*)    Glucose, Bld 104 (*)    AST 46 (*)    All other components within normal limits  I-STAT CHEM 8, ED - Abnormal; Notable for the following:    Glucose, Bld 106 (*)    Hemoglobin 15.6 (*)    All other components within normal limits  PROTIME-INR  APTT  DIFFERENTIAL  URINE RAPID DRUG SCREEN, HOSP PERFORMED  URINALYSIS, ROUTINE W REFLEX MICROSCOPIC (NOT AT [facility])  TROPONIN I  I-STAT TROPOININ, ED  I-STAT TROPOININ, ED    Imaging Review Ct Head Wo Contrast  08/12/2015  CLINICAL DATA:  Altered mental status. EXAM: CT HEAD WITHOUT CONTRAST TECHNIQUE: Contiguous axial images were obtained from the base of the skull through the vertex without intravenous contrast. COMPARISON:  April 16, 2015 FINDINGS: Paranasal sinuses mastoid air cells bones, and extracranial soft tissues are within normal limits. No subdural, epidural, or subarachnoid hemorrhage. An infarct in the left basal ganglia is stable with ex vacuo prominence of the frontal horn of the left lateral  ventricle suggesting chronicity. No acute cortical ischemia or infarct. Cerebellum, brainstem, and basal cisterns are normal. Ventricles and sulci are stable. No mass, mass effect, or midline shift. IMPRESSION: Stable nonacute left basal gangliar infarct. No acute abnormalities. Electronically Signed   By: Dorise Bullion III M.D   On: 08/12/2015 03:03   I have personally reviewed and evaluated these images and lab results as part of my medical decision-making.   EKG Interpretation #1   Date/Time:  Tuesday August 12 2015 00:56:50 EDT Ventricular Rate:  85 PR Interval:    QRS Duration: 84 QT Interval:  374 QTC Calculation: 445 R Axis:   55 Text Interpretation:  Sinus rhythm Borderline T abnormalities, inferior  leads  Confirmed by Dina Rich  MD, Vance ([id number]) on 08/12/2015 1:14:10 AM    EKG Interpretation #2  Date/Time:  Tuesday August 12 2015 05:53:15 EDT Ventricular Rate:  84 PR Interval:    QRS Duration: 88 QT Interval:  385 QTC Calculation: 456 R Axis:   51 Text Interpretation:  Sinus rhythm Borderline T wave abnormalities No significant change since last tracing Confirmed by HORTON  MD, COURTNEY ([id number]) on 08/12/2015 6:08:12 AM        MDM   Final diagnoses:  Transient alteration of awareness   Patient presents with alteration of consciousness and altered mental status. History of similar presentations in the past. She is noncontributory to history taking. Does not appearl focal but is nonverbal. Vital signs are reassuring with the exception of a blood pressure of 189/109. Husband reports a syncopal episode; however, the triage EMT states that the patient walks in and was able to tell her her name. She did not pass out.  Patient has previously had 2 MRIs since January and an EEG which is been unremarkable. Questionable psychogenic component.   1:12 AM Discussed with neurology, Dr. Shon Hale. Given that she is nonfocal and has an extensive workup in the past, will not code stroke her.    Workup is largely reassuring including head CT and lab work. Patient progressively became more verbal and mobile. This is consistent with her prior episodes. She did have recurrent chest pain. EKG was unchanged and troponin 2 is negative. She is able to ambulate independently.  Husband appears very frustrated regarding no actual diagnosis. I have discussed with him that at this point she's had extensive neurologic workup without any explanation for these episodes. She needs to follow-up with Dr. Loretta Plume and may need also to have psychiatric evaluation.  After history, exam, and medical workup I feel the patient has been appropriately medically screened and is safe for discharge home. Pertinent diagnoses were discussed with the patient. Patient was given return precautions.  I personally performed the services described in this documentation, which was scribed in my presence. The recorded information has been reviewed and is accurate.      Merryl Hacker, MD 08/12/15 [phone]

## 2015-08-12 NOTE — ED Notes (Signed)
Patient bib husband, patient unable to communicate and assisted to the wheel chair.  Husband states that last saw patient normal around "8 or 9 o'clock tonight"  Patient was c/o of some slight chest pain at that time per husband.  Husband states that he gave her a 325mg  aspirin and patient went to sleep.  Husband states that there was a knock on the door about 1150PM tonight the patient answered the door and told husband that her chest was hurting  "real bad".  Per husband when he arrived with patient into the ED patient was not able to communicate with him and he was having to hold her up.

## 2015-08-12 NOTE — ED Notes (Signed)
Pt attempted to be ambulated and became weak and unable to stand. Pt assisted to bed and placed back onto cardiac monitor. Dr.Horton notified.

## 2015-08-12 NOTE — ED Notes (Signed)
Pt still not able to respond verbally or open mouth. MD notified. Currently awaiting further orders and disposition.

## 2015-08-12 NOTE — Discharge Instructions (Signed)
Confusion Confusion is the inability to think with your usual speed or clarity. Confusion may come on quickly or slowly over time. How quickly the confusion comes on depends on the cause. Confusion can be due to any number of causes. CAUSES   Concussion, head injury, or head trauma.  Seizures.  Stroke.  Fever.  Brain tumor.  Age related decreased brain function (dementia).  Heightened emotional states like rage or terror.  Mental illness in which the person loses the ability to determine what is real and what is not (hallucinations).  Infections such as a urinary tract infection (UTI).  Toxic effects from alcohol, drugs, or prescription medicines.  Dehydration and an imbalance of salts in the body (electrolytes).  Lack of sleep.  Low blood sugar (diabetes).  Low levels of oxygen from conditions such as chronic lung disorders.  Drug interactions or other medicine side effects.  Nutritional deficiencies, especially niacin, thiamine, vitamin C, or vitamin B.  Sudden drop in body temperature (hypothermia).  Change in routine, such as when traveling or hospitalized. SIGNS AND SYMPTOMS  People often describe their thinking as cloudy or unclear when they are confused. Confusion can also include feeling disoriented. That means you are unaware of where or who you are. You may also not know what the date or time is. If confused, you may also have difficulty paying attention, remembering, and making decisions. Some people also act aggressively when they are confused.  DIAGNOSIS  The medical evaluation of confusion may include:  Blood and urine tests.  X-rays.  Brain and nervous system tests.  Analyzing your brain waves (electroencephalogram or EEG).  Magnetic resonance imaging (MRI) of your head.  Computed tomography (CT) scan of your head.  Mental status tests in which your health care provider may ask many questions. Some of these questions may seem silly or strange,  but they are a very important test to help diagnose and treat confusion. TREATMENT  An admission to the hospital may not be needed, but a person with confusion should not be left alone. Stay with a family member or friend until the confusion clears. Avoid alcohol, pain relievers, or sedative drugs until you have fully recovered. Do not drive until directed by your health care provider. HOME CARE INSTRUCTIONS  What family and friends can do:  To find out if someone is confused, ask the person to state his or her name, age, and the date. If the person is unsure or answers incorrectly, he or she is confused.  Always introduce yourself, no matter how well the person knows you.  Often remind the person of his or her location.  Place a calendar and clock near the confused person.  Help the person with his or her medicines. You may want to use a pill box, an alarm as a reminder, or give the person each dose as prescribed.  Talk about current events and plans for the day.  Try to keep the environment calm, quiet, and peaceful.  Make sure the person keeps follow-up visits with his or her health care provider. PREVENTION  Ways to prevent confusion:  Avoid alcohol.  Eat a balanced diet.  Get enough sleep.  Take medicine only as directed by your health care provider.  Do not become isolated. Spend time with other people and make plans for your days.  Keep careful watch on your blood sugar levels if you are diabetic. SEEK IMMEDIATE MEDICAL CARE IF:   You develop severe headaches, repeated vomiting, seizures, blackouts, or   slurred speech.  There is increasing confusion, weakness, numbness, restlessness, or personality changes.  You develop a loss of balance, have marked dizziness, feel uncoordinated, or fall.  You have delusions, hallucinations, or develop severe anxiety.  Your family members think you need to be rechecked.   This information is not intended to replace advice given  to you by your health care provider. Make sure you discuss any questions you have with your health care provider.   Document Released: 02/19/2004 Document Revised: 02/01/2014 Document Reviewed: 02/16/2013 Elsevier Interactive Patient Education 2016 [employer]  

## 2015-08-12 NOTE — ED Notes (Signed)
Pt family reports understanding of discharge information. No questions at time of discharge 

## 2015-08-12 NOTE — ED Notes (Signed)
PT was able to ambulate without difficulty with Dr.Horton at bedside.

## 2016-04-13 ENCOUNTER — Emergency Department (HOSPITAL_COMMUNITY)
Admission: EM | Admit: 2016-04-13 | Discharge: 2016-04-14 | Disposition: A | Discharge status: Home / Self Care | Payer: Medicaid Other | Attending: Emergency Medicine | Admitting: Emergency Medicine

## 2016-04-13 ENCOUNTER — Emergency Department (HOSPITAL_COMMUNITY): Payer: Medicaid Other

## 2016-04-13 DIAGNOSIS — R1031 Right lower quadrant pain: Secondary | ICD-10-CM

## 2016-04-13 DIAGNOSIS — F17200 Nicotine dependence, unspecified, uncomplicated: Secondary | ICD-10-CM | POA: Diagnosis not present

## 2016-04-13 DIAGNOSIS — Z8673 Personal history of transient ischemic attack (TIA), and cerebral infarction without residual deficits: Secondary | ICD-10-CM | POA: Insufficient documentation

## 2016-04-13 DIAGNOSIS — R1030 Lower abdominal pain, unspecified: Secondary | ICD-10-CM | POA: Diagnosis present

## 2016-04-13 DIAGNOSIS — Z79899 Other long term (current) drug therapy: Secondary | ICD-10-CM | POA: Diagnosis not present

## 2016-04-13 DIAGNOSIS — I10 Essential (primary) hypertension: Secondary | ICD-10-CM | POA: Diagnosis not present

## 2016-04-13 DIAGNOSIS — K625 Hemorrhage of anus and rectum: Secondary | ICD-10-CM | POA: Insufficient documentation

## 2016-04-13 LAB — CBC
HCT: 42.5 % (ref 36.0–46.0)
Hemoglobin: 14.8 g/dL (ref 12.0–15.0)
MCH: 31.4 pg (ref 26.0–34.0)
MCHC: 34.8 g/dL (ref 30.0–36.0)
MCV: 90.2 fL (ref 78.0–100.0)
PLATELETS: 336 10*3/uL (ref 150–400)
RBC: 4.71 MIL/uL (ref 3.87–5.11)
RDW: 13.3 % (ref 11.5–15.5)
WBC: 9.2 10*3/uL (ref 4.0–10.5)

## 2016-04-13 LAB — URINALYSIS, ROUTINE W REFLEX MICROSCOPIC
Bilirubin Urine: NEGATIVE
Glucose, UA: NEGATIVE mg/dL
Ketones, ur: NEGATIVE mg/dL
Leukocytes, UA: NEGATIVE
Nitrite: NEGATIVE
Protein, ur: NEGATIVE mg/dL
Specific Gravity, Urine: 1.017 (ref 1.005–1.030)
pH: 5 (ref 5.0–8.0)

## 2016-04-13 LAB — TYPE AND SCREEN
ABO/RH(D): A POS
Antibody Screen: NEGATIVE

## 2016-04-13 LAB — COMPREHENSIVE METABOLIC PANEL
ALK PHOS: 84 U/L (ref 38–126)
ALT: 43 U/L (ref 14–54)
AST: 35 U/L (ref 15–41)
Albumin: 4.1 g/dL (ref 3.5–5.0)
Anion gap: 10 (ref 5–15)
BUN: 8 mg/dL (ref 6–20)
CALCIUM: 9.4 mg/dL (ref 8.9–10.3)
CO2: 20 mmol/L — ABNORMAL LOW (ref 22–32)
CREATININE: 0.68 mg/dL (ref 0.44–1.00)
Chloride: 108 mmol/L (ref 101–111)
Glucose, Bld: 96 mg/dL (ref 65–99)
Potassium: 3.7 mmol/L (ref 3.5–5.1)
Sodium: 138 mmol/L (ref 135–145)
Total Bilirubin: 0.3 mg/dL (ref 0.3–1.2)
Total Protein: 7.5 g/dL (ref 6.5–8.1)

## 2016-04-13 LAB — ABO/RH: ABO/RH(D): A POS

## 2016-04-13 LAB — LIPASE, BLOOD: Lipase: 30 U/L (ref 11–51)

## 2016-04-13 LAB — POC OCCULT BLOOD, ED: FECAL OCCULT BLD: POSITIVE — AB

## 2016-04-13 LAB — PREGNANCY, URINE: Preg Test, Ur: NEGATIVE

## 2016-04-13 MED ORDER — SODIUM CHLORIDE 0.9 % IV BOLUS (SEPSIS)
1000.0000 mL | Freq: Once | INTRAVENOUS | Status: AC
  Administered 2016-04-13: 1000 mL via INTRAVENOUS

## 2016-04-13 MED ORDER — IOPAMIDOL (ISOVUE-300) INJECTION 61%
100.0000 mL | Freq: Once | INTRAVENOUS | Status: AC | PRN
  Administered 2016-04-13: 100 mL via INTRAVENOUS

## 2016-04-13 MED ORDER — MORPHINE SULFATE (PF) 4 MG/ML IV SOLN
4.0000 mg | Freq: Once | INTRAVENOUS | Status: AC
  Administered 2016-04-13: 4 mg via INTRAVENOUS
  Filled 2016-04-13: qty 1

## 2016-04-13 MED ORDER — IOPAMIDOL (ISOVUE-300) INJECTION 61%
INTRAVENOUS | Status: AC
  Filled 2016-04-13: qty 100

## 2016-04-13 NOTE — ED Notes (Signed)
ED Provider at bedside. 

## 2016-04-13 NOTE — ED Provider Notes (Signed)
[facility] DEPT Provider Note   CSN: [id number] Arrival date & time: 04/13/16  0102     History   Chief Complaint Chief Complaint  Patient presents with  . Rectal Bleeding  . Abdominal Pain    HPI Denise [name] is a 46 y.o. female.  HPI   46 year old female presents today with complaints of abdominal pain and rectal bleeding.  Patient notes that 4 days ago she started to have very dark blood in her stools approximately 4 days ago.  She denies any rectal bleeding without bowel movements, notes this is only with normal BMs.  Patient notes around the same time she developed lower abdominal pain over the suprapubic region.  She denies any nausea or vomiting other than her baseline morning nausea which has been present for years.  She denies any fever.  No vaginal discharge or bleeding.  Patient denies any history hemorrhoids.  BMs nonpainful.  No constipation.   Past Medical History:  Diagnosis Date  . Anxiety   . Blood transfusion without reported diagnosis   . Depression   . Hyperlipemia   . Hypertension   . Memory changes   . Overactive bladder   . Stroke ([place])   . Substance abuse   . TIA (transient ischemic attack)     Patient Active Problem List   Diagnosis Date Noted  . Tobacco use 04/14/2015  . Alcohol use 04/14/2015  . Right sided weakness   . Syncope 02/07/2015  . Pelvic pain in female 12/18/2014  . Fibroid uterus 12/18/2014  . Hypertension 12/18/2014    Past Surgical History:  Procedure Laterality Date  . CESAREAN SECTION      OB History    Gravida Para Term Preterm AB Living   3 3       3    SAB TAB Ectopic Multiple Live Births                   Home Medications    Prior to Admission medications   Medication Sig Start Date End Date Taking? Authorizing Provider  amLODipine (NORVASC) 10 MG tablet Take 10 mg by mouth daily.   Yes Historical Provider, MD  atorvastatin (LIPITOR) 10 MG tablet Take 10 mg by mouth at bedtime. 06/29/15  Yes  Historical Provider, MD  cetirizine (ZYRTEC) 10 MG tablet Take 10 mg by mouth daily.   Yes Historical Provider, MD  ibuprofen (ADVIL,MOTRIN) 200 MG tablet Take 400 mg by mouth every 6 (six) hours as needed for moderate pain.   Yes Historical Provider, MD  lisinopril (PRINIVIL,ZESTRIL) 20 MG tablet Take 20 mg by mouth 2 (two) times daily.   Yes Historical Provider, MD  omeprazole (PRILOSEC) 20 MG capsule Take 20 mg by mouth 2 (two) times daily before a meal.   Yes Historical Provider, MD  polyethylene glycol powder (GLYCOLAX/MIRALAX) powder TAKE 17 GRAMS MIXED WITH 8 OZ WATER, JUICE, SODA, COFFEE, OR TEA AND DRINK 1 TO 2 TIMES DAILY 06/26/15  Yes Historical Provider, MD  triamterene-hydrochlorothiazide (MAXZIDE-25) 37.5-25 MG tablet Take 1 tablet by mouth daily.   Yes Historical Provider, MD  topiramate (TOPAMAX) 50 MG tablet topiramate 25mg  (1/2 tablet) at bedtime for 7 days, then 50mg  at bedtime. Patient not taking: Reported on 08/12/2015 05/20/15   Pieter Partridge, DO  traMADol (ULTRAM) 50 MG tablet Take 1 tablet (50 mg total) by mouth 2 (two) times daily. Patient not taking: Reported on 07/04/2015 05/06/15   Doran Stabler, MD    Family History  Family History  Problem Relation Age of Onset  . Stroke Mother   . Heart disease Maternal Grandmother     Social History Social History  Substance Use Topics  . Smoking status: Current Every Day Smoker  . Smokeless tobacco: Never Used  . Alcohol use No     Comment: 2 a day     Allergies   Patient has no known allergies.   Review of Systems Review of Systems  All other systems reviewed and are negative.  Physical Exam Updated Vital Signs BP 109/78 (BP Location: Right Arm)   Pulse 92   Temp 98.2 F (36.8 C) (Oral)   Resp 15   SpO2 93%   Physical Exam  Constitutional: She is oriented to person, place, and time. She appears well-developed and well-nourished.  HENT:  Head: Normocephalic and atraumatic.  Eyes: Conjunctivae are normal.  Pupils are equal, round, and reactive to light. Right eye exhibits no discharge. Left eye exhibits no discharge. No scleral icterus.  Neck: Normal range of motion. No JVD present. No tracheal deviation present.  Pulmonary/Chest: Effort normal. No stridor.  Abdominal: Soft.  TTP of suprapubic and RLQ  Neurological: She is alert and oriented to person, place, and time. Coordination normal.  Psychiatric: She has a normal mood and affect. Her behavior is normal. Judgment and thought content normal.  Nursing note and vitals reviewed.    ED Treatments / Results  Labs (all labs ordered are listed, but only abnormal results are displayed) Labs Reviewed  COMPREHENSIVE METABOLIC PANEL - Abnormal; Notable for the following:       Result Value   CO2 20 (*)    All other components within normal limits  URINALYSIS, ROUTINE W REFLEX MICROSCOPIC - Abnormal; Notable for the following:    Hgb urine dipstick MODERATE (*)    Bacteria, UA RARE (*)    Squamous Epithelial / LPF 0-5 (*)    All other components within normal limits  POC OCCULT BLOOD, ED - Abnormal; Notable for the following:    Fecal Occult Bld POSITIVE (*)    All other components within normal limits  CBC  LIPASE, BLOOD  PREGNANCY, URINE  TYPE AND SCREEN  ABO/RH    EKG  EKG Interpretation None       Radiology No results found.   Procedures Procedures (including critical care time)  Medications Ordered in ED Medications  morphine 4 MG/ML injection 4 mg (4 mg Intravenous Given 04/13/16 2146)  sodium chloride 0.9 % bolus 1,000 mL (1,000 mLs Intravenous New Bag/Given 04/13/16 2145)     Initial Impression / Assessment and Plan / ED Course  I have reviewed the triage vital signs and the nursing notes.  Pertinent labs & imaging results that were available during my care of the patient were reviewed by me and considered in my medical decision making (see chart for details).      Final Clinical Impressions(s) / ED  Diagnoses   Final diagnoses:  Right lower quadrant abdominal pain    Labs: CMP, CBC, lipase, urinalysis  Imaging: CT abdomen pelvis with  Consults:  Therapeutics: Morphine, normal saline  Discharge Meds:   Assessment/Plan: 46 year old presents with abdominal pain and rectal bleeding.  Patient is afebrile nontoxic in no acute distress.  Patient's laboratory analysis is reassuring.  Due to location of pain CT scan will be ordered.  Rectal exam shows red blood, this is a very small amount.  CT scan shows no significant abnormalities I feel patient will be  stable for discharge home with close follow-up with GI.  Patient has an appointment in 3 days with gastroenterology specialist.  She has no signs of acute life-threatening bleed, no other acute findings.  Patient care sign to oncoming provider pending CT analysis and disposition.   New Prescriptions New Prescriptions   No medications on file     Okey Regal, PA-C 04/13/16 [place], MD 04/20/16 [phone]

## 2016-04-13 NOTE — ED Notes (Signed)
Patient transported to CT 

## 2016-04-13 NOTE — Discharge Instructions (Signed)
Keep your scheduled appointment with gastroenterology for evaluation of rectal bleeding this week.

## 2016-04-13 NOTE — ED Triage Notes (Signed)
Pt presents via EMS after having dark rectal bleeding x 4 days after having a BM. Pt takes a laxative on a normal basis to have a BM. Lower abd pain on palpation with lower back pain. Denies constipation. Denies urinary symptoms. Alert and oriented per norm.

## 2016-04-13 NOTE — ED Provider Notes (Signed)
AP, rectal bleeding = dark red blood with bowel movements RLQ, suprapubic AP No urinary or vaginal complaints  Pending CT eval for appy ? Hemorrhoid  CT abd/pel negative for acute finding. Normal appendix. VSS. She is felt appropriate for discharge per plan of previous treatment team. She has a scheduled appointment in place with GI for follow up of rectal bleeding this week.    Charlann Lange, PA-C 04/13/16 [id number]    Charlesetta Shanks, MD 04/20/16 [phone]

## 2016-04-14 NOTE — ED Notes (Signed)
Patient was alert, oriented and stable upon discharge. RN went over AVS and patient had no further questions.  

## 2016-04-16 ENCOUNTER — Encounter: Payer: Self-pay | Admitting: Nurse Practitioner

## 2016-04-16 ENCOUNTER — Ambulatory Visit (INDEPENDENT_AMBULATORY_CARE_PROVIDER_SITE_OTHER): Payer: Medicaid Other | Admitting: Nurse Practitioner

## 2016-04-16 VITALS — BP 130/90 | HR 94 | Ht 64.0 in | Wt 245.6 lb

## 2016-04-16 DIAGNOSIS — K625 Hemorrhage of anus and rectum: Secondary | ICD-10-CM | POA: Diagnosis not present

## 2016-04-16 DIAGNOSIS — K602 Anal fissure, unspecified: Secondary | ICD-10-CM | POA: Diagnosis not present

## 2016-04-16 DIAGNOSIS — K5900 Constipation, unspecified: Secondary | ICD-10-CM | POA: Diagnosis not present

## 2016-04-16 MED ORDER — DILTIAZEM GEL 2 %: 1.0000 "application " | Freq: Three times a day (TID) | CUTANEOUS | 1 refills | Status: DC

## 2016-04-16 NOTE — Progress Notes (Addendum)
     HPI: Patient is a 46 year old female known to Dr. Loletha Carrow. She had a colonoscopy May 2017 for evaluation constipation and abdominal pain. Exam was normal except for left-sided diverticulosis. Patient comes in today with a week's worth of painless rectal bleeding with BMs. . Blood is on tissue and in the stool and sometimes light red, other times darker red.  She has chronic abdominal discomfort which hasn't changed or escalated. She takes miralax and stools are soft, frequency at baseline.    Past Medical History:  Diagnosis Date  . Anxiety   . Blood transfusion without reported diagnosis   . Depression   . Hyperlipemia   . Hypertension   . Memory changes   . Overactive bladder   . Stroke ([place])   . Substance abuse   . TIA (transient ischemic attack)     Patient's surgical history, family medical history, social history, medications and allergies were all reviewed in Epic    Physical Exam: BP 130/90   Pulse 94   Ht 5\' 4"  (1.626 m)   Wt 245 lb 9.6 oz (111.4 kg)   SpO2 94%   BMI 42.16 kg/m   GENERAL: Obese black female in NAD PSYCH: :Pleasant, cooperative, normal affect EENT:  conjunctiva pink, mucous membranes moist, neck supple without masses PULM: Normal respiratory effort, lungs  ABDOMEN:  soft, nontender, nondistended, no obvious masses, no hepatomegaly,  normal bowel sounds RECTAL: No external lesions. Attempted to do a rectal exam but she had severe pain at posterior midline.  Musculoskeletal:  Normal muscle tone, normal strength NEURO: Alert and oriented x 3, no focal neurologic deficits   ASSESSMENT and PLAN:  46 yo female with one week history of painless rectal bleeding with BMs. Hemodynamically stable. She had a colonoscopy with findings of diverticulosis less than a year ago. Based on exam I suspect she has a fissure though surprising that defecation not painful. No hemorrhoids on colonoscopy. I don't think this is diverticular -treat for probable anal  fissure with Diltiazem Gel TID for 6 weeks -sitz baths TID -avoid straining. -continue miralax -call in 7-10 days if persistent bleeding, otherwise call in 3-4 weeks to touch base   Tye Savoy , NP 04/16/2016, 1:27 PM

## 2016-04-16 NOTE — Patient Instructions (Signed)
Please pick up the diltiazem gel that we sent to [facility]) to use a small amount rectally three times a day x 6 weeks.  Do Sitz baths and continue your Miralax. Avoid straining while having a bowel movement.  Call back in 7- 10 days with an update if you still have rectal bleeding.   Call in 3-4 weeks with final update on your symptoms.

## 2016-04-19 NOTE — Progress Notes (Signed)
Thank you for sending this case to me. I have reviewed the entire note, and the outlined plan seems appropriate.  Seems most likely fissure.  If she is not better in a week, please change to NTG ointment that we have compounded at [facility].  Also, there is a typo early in your note where you indicate she is 46 years old. Wilfrid Lund, MD

## 2016-04-21 ENCOUNTER — Telehealth: Payer: Self-pay

## 2016-04-27 NOTE — Telephone Encounter (Signed)
noted 

## 2016-04-27 NOTE — Telephone Encounter (Signed)
OK, if not in pain and bleeding stopped then will watch and wait. She is very current with a colonoscopy. If this is a fissure  I'm sure she will calling back

## 2016-05-28 DIAGNOSIS — L209 Atopic dermatitis, unspecified: Secondary | ICD-10-CM

## 2016-05-28 HISTORY — DX: Atopic dermatitis, unspecified: L20.9

## 2016-06-14 ENCOUNTER — Emergency Department (HOSPITAL_COMMUNITY): Payer: Medicaid Other

## 2016-06-14 ENCOUNTER — Encounter (HOSPITAL_COMMUNITY): Payer: Self-pay

## 2016-06-14 ENCOUNTER — Emergency Department (HOSPITAL_COMMUNITY)
Admission: EM | Admit: 2016-06-14 | Discharge: 2016-06-14 | Disposition: A | Discharge status: Home / Self Care | Payer: Medicaid Other | Attending: Emergency Medicine | Admitting: Emergency Medicine

## 2016-06-14 DIAGNOSIS — R0789 Other chest pain: Secondary | ICD-10-CM | POA: Diagnosis not present

## 2016-06-14 DIAGNOSIS — Z7982 Long term (current) use of aspirin: Secondary | ICD-10-CM | POA: Insufficient documentation

## 2016-06-14 DIAGNOSIS — R072 Precordial pain: Secondary | ICD-10-CM | POA: Diagnosis present

## 2016-06-14 DIAGNOSIS — G44039 Episodic paroxysmal hemicrania, not intractable: Secondary | ICD-10-CM

## 2016-06-14 DIAGNOSIS — Z79899 Other long term (current) drug therapy: Secondary | ICD-10-CM | POA: Insufficient documentation

## 2016-06-14 DIAGNOSIS — I10 Essential (primary) hypertension: Secondary | ICD-10-CM | POA: Insufficient documentation

## 2016-06-14 DIAGNOSIS — Z8673 Personal history of transient ischemic attack (TIA), and cerebral infarction without residual deficits: Secondary | ICD-10-CM | POA: Diagnosis not present

## 2016-06-14 DIAGNOSIS — F17200 Nicotine dependence, unspecified, uncomplicated: Secondary | ICD-10-CM | POA: Insufficient documentation

## 2016-06-14 LAB — I-STAT TROPONIN, ED
TROPONIN I, POC: 0.01 ng/mL (ref 0.00–0.08)
Troponin i, poc: 0 ng/mL (ref 0.00–0.08)

## 2016-06-14 LAB — CBC
HEMATOCRIT: 39.6 % (ref 36.0–46.0)
HEMOGLOBIN: 13.3 g/dL (ref 12.0–15.0)
MCH: 31.2 pg (ref 26.0–34.0)
MCHC: 33.6 g/dL (ref 30.0–36.0)
MCV: 93 fL (ref 78.0–100.0)
Platelets: 270 10*3/uL (ref 150–400)
RBC: 4.26 MIL/uL (ref 3.87–5.11)
RDW: 14.1 % (ref 11.5–15.5)
WBC: 8.4 10*3/uL (ref 4.0–10.5)

## 2016-06-14 LAB — BASIC METABOLIC PANEL
ANION GAP: 7 (ref 5–15)
BUN: 6 mg/dL (ref 6–20)
CHLORIDE: 109 mmol/L (ref 101–111)
CO2: 23 mmol/L (ref 22–32)
Calcium: 9.1 mg/dL (ref 8.9–10.3)
Creatinine, Ser: 0.68 mg/dL (ref 0.44–1.00)
GFR calc Af Amer: >60 mL/min (ref >60)
Glucose, Bld: 97 mg/dL (ref 65–99)
POTASSIUM: 3.7 mmol/L (ref 3.5–5.1)
SODIUM: 139 mmol/L (ref 135–145)

## 2016-06-14 MED ORDER — METOCLOPRAMIDE HCL 5 MG/ML IJ SOLN
10.0000 mg | Freq: Once | INTRAMUSCULAR | Status: AC
  Administered 2016-06-14: 10 mg via INTRAVENOUS
  Filled 2016-06-14: qty 2

## 2016-06-14 NOTE — ED Triage Notes (Signed)
Pt appears non-verbal as per husband pt has had a stroke one year ago with limited movement on the R side, pt comes in today with chest pain that started this afternoon was given 325 mg ASA in field and 1 nitro

## 2016-06-14 NOTE — ED Provider Notes (Signed)
[facility] DEPT Provider Note   CSN: [id number] Arrival date & time: 06/14/16  1532     History   Chief Complaint Chief Complaint  Patient presents with  . Chest Pain    chest pain that started at 12 noon today     HPI Denise [name] is a 46 y.o. female.  HPI  46 Y/O F with hx of strokes, HL, HTN comes in with cc of chest pain and headaches. Pt reports that today she started having sudden onset chest pain over the midsternum, radiating across her chest and towards her neck. She has associated numbness to the L arm. Pt denies any dib, diaphoresis. Pt has no known CAD hx. Pt's chest pain has been constant.  Pt also reports headaches. She has hx of intermittent headache and seizure like spells. Today she had a seizure like spell as well, that is described as complete loss of tone on the L side without any tonic clonic shaking or incontinence. Pt is being seen by Neuro for those, she has had neg EEG in the past per husband.  Pt has no associated nausea, vomiting, loss of consciousness or new visual complains, weakness, numbness, dizziness.      Past Medical History:  Diagnosis Date  . Anxiety   . Blood transfusion without reported diagnosis   . Depression   . Hyperlipemia   . Hypertension   . Memory changes   . Overactive bladder   . Stroke ([place])   . Substance abuse   . TIA (transient ischemic attack)     Patient Active Problem List   Diagnosis Date Noted  . Tobacco use 04/14/2015  . Alcohol use 04/14/2015  . Right sided weakness   . Syncope 02/07/2015  . Pelvic pain in female 12/18/2014  . Fibroid uterus 12/18/2014  . Hypertension 12/18/2014    Past Surgical History:  Procedure Laterality Date  . CESAREAN SECTION      OB History    Gravida Para Term Preterm AB Living   3 3       3    SAB TAB Ectopic Multiple Live Births                   Home Medications    Prior to Admission medications   Medication Sig Start Date End Date Taking?  Authorizing Provider  amLODipine (NORVASC) 10 MG tablet Take 10 mg by mouth daily.   Yes [provider]  aspirin EC 325 MG tablet Take 325-975 mg by mouth See admin instructions. ONCE AS NEEDED FOR CHEST PAINS OR HEADACHES   Yes [provider]  atorvastatin (LIPITOR) 10 MG tablet Take 10 mg by mouth at bedtime. 06/29/15  Yes [provider]  cetirizine (ZYRTEC) 10 MG tablet Take 10 mg by mouth daily.   Yes [provider]  citalopram (CELEXA) 20 MG tablet Take 20 mg by mouth daily.   Yes [provider]  ibuprofen (ADVIL,MOTRIN) 200 MG tablet Take 400 mg by mouth every 6 (six) hours as needed for moderate pain.   Yes [provider]  lisinopril (PRINIVIL,ZESTRIL) 20 MG tablet Take 20 mg by mouth 2 (two) times daily.   Yes [provider]  meloxicam (MOBIC) 7.5 MG tablet Take 7.5 mg by mouth daily.   Yes [provider]  polyethylene glycol powder (GLYCOLAX/MIRALAX) powder Mix 17 g with 4-8 ounces of juice and drink once a day 06/26/15  Yes [provider]  prazosin (MINIPRESS) 2 MG capsule Take 2  mg by mouth at bedtime.   Yes [provider]  Triamcinolone Acetonide 0.025 % LOTN Apply 1 application topically daily. SHOULDERS AND ARMS 05/31/16  Yes [provider]  triamterene-hydrochlorothiazide (MAXZIDE-25) 37.5-25 MG tablet Take 1 tablet by mouth daily.   Yes [provider]  diltiazem 2 % GEL Apply 1 application topically 3 (three) times daily. Patient not taking: Reported on 06/14/2016 04/16/16   Willia Craze, NP  omeprazole (PRILOSEC) 20 MG capsule Take 20 mg by mouth 2 (two) times daily before a meal.    [provider]  topiramate (TOPAMAX) 50 MG tablet topiramate 25mg  (1/2 tablet) at bedtime for 7 days, then 50mg  at bedtime. Patient not taking: Reported on 06/14/2016 05/20/15   Pieter Partridge, DO  traMADol (ULTRAM) 50 MG tablet Take 1 tablet (50 mg total) by mouth 2 (two) times  daily. Patient not taking: Reported on 06/14/2016 05/06/15   Doran Stabler, MD    Family History Family History  Problem Relation Age of Onset  . Stroke Mother   . Heart disease Maternal Grandmother     Social History Social History  Substance Use Topics  . Smoking status: Current Every Day Smoker  . Smokeless tobacco: Never Used  . Alcohol use No     Comment: 2 a day     Allergies   Patient has no known allergies.   Review of Systems Review of Systems  Constitutional: Positive for activity change. Negative for diaphoresis.  HENT: Negative for congestion.   Respiratory: Negative for cough, shortness of breath and wheezing.   Cardiovascular: Positive for chest pain.  Gastrointestinal: Positive for diarrhea and nausea. Negative for abdominal distention, abdominal pain, blood in stool and vomiting.  Genitourinary: Negative for dysuria.  Musculoskeletal: Negative for neck pain.  Skin: Negative for color change.  Neurological: Positive for weakness and headaches. Negative for tremors, syncope and speech difficulty.  Hematological: Does not bruise/bleed easily.  Psychiatric/Behavioral: Negative for confusion.     Physical Exam Updated Vital Signs BP 113/65   Pulse 70   Temp 98.6 F (37 C) (Oral)   Resp 18   SpO2 94%   Physical Exam  Constitutional: She is oriented to person, place, and time. She appears well-developed and well-nourished.  HENT:  Head: Normocephalic and atraumatic.  Eyes: EOM are normal. Pupils are equal, round, and reactive to light.  Neck: Neck supple.  Cardiovascular: Normal rate, regular rhythm and normal heart sounds.   No murmur heard. Pulmonary/Chest: Effort normal. No respiratory distress.  Abdominal: Soft. She exhibits no distension. There is no tenderness. There is no rebound and no guarding.  Musculoskeletal: She exhibits no tenderness.  Neurological: She is alert and oriented to person, place, and time. No cranial nerve deficit.    R sided paralysis. L side upper and lower extremity strength is 4+/5 and pt has no numbness.  Skin: Skin is warm and dry.  Nursing note and vitals reviewed.    ED Treatments / Results  Labs (all labs ordered are listed, but only abnormal results are displayed) Labs Reviewed  BASIC METABOLIC PANEL  [place], ED  I-STAT TROPOININ, ED    EKG  EKG Interpretation  Date/Time:  Monday Jun 14 2016 15:44:07 EDT Ventricular Rate:  71 PR Interval:    QRS Duration: 88 QT Interval:  420 QTC Calculation: 457 R Axis:   41 Text Interpretation:  Sinus rhythm No significant change since last tracing Confirmed by ISAACS MD, CAMERON [phone])  on 06/14/2016 3:47:23 PM       Radiology Dg Chest 2 View  Result Date: 06/14/2016 CLINICAL DATA:  Chest pain EXAM: CHEST  2 VIEW COMPARISON:  None. FINDINGS: Cardiac shadow is at the upper limits of normal in size. The lungs are well aerated bilaterally. No acute bony abnormality is seen. IMPRESSION: No active cardiopulmonary disease. Electronically Signed   By: Inez Catalina M.D.   On: 06/14/2016 16:12    Procedures Procedures (including critical care time)  Medications Ordered in ED Medications  metoCLOPramide (REGLAN) injection 10 mg (10 mg Intravenous Given 06/14/16 1655)     Initial Impression / Assessment and Plan / ED Course  I have reviewed the triage vital signs and the nursing notes.  Pertinent labs & imaging results that were available during my care of the patient were reviewed by me and considered in my medical decision making (see chart for details).  Clinical Course as of Jun 14 2129  Mon Jun 14, 2016  1850 Headache improved. 2nd trop pending. 1st is normal. Spoke with Cards, they will set up an appointment as an outpatient.  [AN]  2129 Results from the ER workup discussed with the patient face to face and all questions answered to the best of my ability. Strict ER return precautions have been discussed, and patient  is agreeing with the plan and is comfortable with the workup done and the recommendations from the ER.   [AN]    Clinical Course User Index [AN] Varney Biles, MD    Pt presents with atypical chest pain. Chest pain is radiating to the  Sides and there is some L sided numbness.   Differential diagnosis includes: ACS syndrome Aortic dissection Pericarditis Pneumonia PE Pneumothorax Musculoskeletal pain PUD / Gastritis / Esophagitis Esophageal spasm  Anxiety also possible. But given the hx of strokes and other ACS risk factors, we will get trops x 2 and if normal d/c for close outpatient f/u.   Pt also has a headache. She has hx of headaches, and this is similar. We will give reglan and reassess. No focal neuro deficit that is new.  Final Clinical Impressions(s) / ED Diagnoses   Final diagnoses:  Nonintractable paroxysmal hemicrania, unspecified chronicity pattern  Atypical chest pain    New Prescriptions New Prescriptions   No medications on file     Varney Biles, MD 06/14/16 2133

## 2016-06-14 NOTE — Discharge Instructions (Signed)
We saw you in the ER for the chest pain and headaches. All of our cardiac workup is normal, including labs, EKG and chest X-RAY are normal. We are not sure what is causing your discomfort, but we feel comfortable sending you home at this time. The workup in the ER is not complete, and you should follow up with Cardiologist for further evaluation.  Please return to the ER if you have worsening chest pain, shortness of breath, pain radiating to your jaw, shoulder, or back, sweats or fainting. Otherwise see the Cardiologist or your primary care doctor as requested.

## 2016-06-15 ENCOUNTER — Telehealth: Payer: Self-pay

## 2016-06-15 NOTE — Telephone Encounter (Signed)
SENT NOTE SCHEDULING

## 2016-07-05 ENCOUNTER — Ambulatory Visit (INDEPENDENT_AMBULATORY_CARE_PROVIDER_SITE_OTHER): Payer: Medicaid Other | Admitting: Physician Assistant

## 2016-07-05 ENCOUNTER — Encounter: Payer: Self-pay | Admitting: Physician Assistant

## 2016-07-05 VITALS — BP 152/98 | HR 76 | Ht 65.0 in | Wt 252.0 lb

## 2016-07-05 DIAGNOSIS — Z01818 Encounter for other preprocedural examination: Secondary | ICD-10-CM

## 2016-07-05 DIAGNOSIS — Z720 Tobacco use: Secondary | ICD-10-CM

## 2016-07-05 DIAGNOSIS — R55 Syncope and collapse: Secondary | ICD-10-CM | POA: Diagnosis not present

## 2016-07-05 DIAGNOSIS — I10 Essential (primary) hypertension: Secondary | ICD-10-CM | POA: Diagnosis not present

## 2016-07-05 NOTE — Patient Instructions (Addendum)
Medication Instructions:    Your physician recommends that you continue on your current medications as directed. Please refer to the Current Medication list given to you today.   If you need a refill on your cardiac medications before your next appointment, please call your pharmacy.  Labwork: NONE ORDERED  TODAY    Testing/Procedures: NONE ORDERED  TODAY    Follow-Up:  CONTACT [facility] 336 [phone] AS NEEDED FOR  ANY CARDIAC RELATED SYMPTOMS    Any Other Special Instructions Will Be Listed Below (If Applicable).    Low-Sodium Eating Plan Sodium, which is an element that makes up salt, helps you maintain a healthy balance of fluids in your body. Too much sodium can increase your blood pressure and cause fluid and waste to be held in your body. Your health care provider or dietitian may recommend following this plan if you have high blood pressure (hypertension), kidney disease, liver disease, or heart failure. Eating less sodium can help lower your blood pressure, reduce swelling, and protect your heart, liver, and kidneys. What are tips for following this plan? General guidelines  Most people on this plan should limit their sodium intake to 1,500-2,000 mg (milligrams) of sodium each day. Reading food labels  The Nutrition Facts label lists the amount of sodium in one serving of the food. If you eat more than one serving, you must multiply the listed amount of sodium by the number of servings.  Choose foods with less than 140 mg of sodium per serving.  Avoid foods with 300 mg of sodium or more per serving. Shopping  Look for lower-sodium products, often labeled as "low-sodium" or "no salt added."  Always check the sodium content even if foods are labeled as "unsalted" or "no salt added".  Buy fresh foods. ? Avoid canned foods and premade or frozen meals. ? Avoid canned, cured, or processed meats  Buy breads that have less than 80 mg of sodium per  slice. Cooking  Eat more home-cooked food and less restaurant, buffet, and fast food.  Avoid adding salt when cooking. Use salt-free seasonings or herbs instead of table salt or sea salt. Check with your health care provider or pharmacist before using salt substitutes.  Cook with plant-based oils, such as canola, sunflower, or olive oil. Meal planning  When eating at a restaurant, ask that your food be prepared with less salt or no salt, if possible.  Avoid foods that contain MSG (monosodium glutamate). MSG is sometimes added to [place] food, bouillon, and some canned foods. What foods are recommended? The items listed may not be a complete list. Talk with your dietitian about what dietary choices are best for you. Grains Low-sodium cereals, including oats, puffed wheat and rice, and shredded wheat. Low-sodium crackers. Unsalted rice. Unsalted pasta. Low-sodium bread. Whole-grain breads and whole-grain pasta. Vegetables Fresh or frozen vegetables. "No salt added" canned vegetables. "No salt added" tomato sauce and paste. Low-sodium or reduced-sodium tomato and vegetable juice. Fruits Fresh, frozen, or canned fruit. Fruit juice. Meats and other protein foods Fresh or frozen (no salt added) meat, poultry, seafood, and fish. Low-sodium canned tuna and salmon. Unsalted nuts. Dried peas, beans, and lentils without added salt. Unsalted canned beans. Eggs. Unsalted nut butters. Dairy Milk. Soy milk. Cheese that is naturally low in sodium, such as ricotta cheese, fresh mozzarella, or Swiss cheese Low-sodium or reduced-sodium cheese. Cream cheese. Yogurt. Fats and oils Unsalted butter. Unsalted margarine with no trans fat. Vegetable oils such as canola or olive oils.  Seasonings and other foods Fresh and dried herbs and spices. Salt-free seasonings. Low-sodium mustard and ketchup. Sodium-free salad dressing. Sodium-free light mayonnaise. Fresh or refrigerated horseradish. Lemon juice. Vinegar.  Homemade, reduced-sodium, or low-sodium soups. Unsalted popcorn and pretzels. Low-salt or salt-free chips. What foods are not recommended? The items listed may not be a complete list. Talk with your dietitian about what dietary choices are best for you. Grains Instant hot cereals. Bread stuffing, pancake, and biscuit mixes. Croutons. Seasoned rice or pasta mixes. Noodle soup cups. Boxed or frozen macaroni and cheese. Regular salted crackers. Self-rising flour. Vegetables Sauerkraut, pickled vegetables, and relishes. Olives. [place] fries. Onion rings. Regular canned vegetables (not low-sodium or reduced-sodium). Regular canned tomato sauce and paste (not low-sodium or reduced-sodium). Regular tomato and vegetable juice (not low-sodium or reduced-sodium). Frozen vegetables in sauces. Meats and other protein foods Meat or fish that is salted, canned, smoked, spiced, or pickled. Bacon, ham, sausage, hotdogs, corned beef, chipped beef, packaged lunch meats, salt pork, jerky, pickled herring, anchovies, regular canned tuna, sardines, salted nuts. Dairy Processed cheese and cheese spreads. Cheese curds. Blue cheese. Feta cheese. String cheese. Regular cottage cheese. Buttermilk. Canned milk. Fats and oils Salted butter. Regular margarine. Ghee. Bacon fat. Seasonings and other foods Onion salt, garlic salt, seasoned salt, table salt, and sea salt. Canned and packaged gravies. Worcestershire sauce. Tartar sauce. Barbecue sauce. Teriyaki sauce. Soy sauce, including reduced-sodium. Steak sauce. Fish sauce. Oyster sauce. Cocktail sauce. Horseradish that you find on the shelf. Regular ketchup and mustard. Meat flavorings and tenderizers. Bouillon cubes. Hot sauce and Tabasco sauce. Premade or packaged marinades. Premade or packaged taco seasonings. Relishes. Regular salad dressings. Salsa. Potato and tortilla chips. Corn chips and puffs. Salted popcorn and pretzels. Canned or dried soups. Pizza. Frozen entrees and  pot pies. Summary  Eating less sodium can help lower your blood pressure, reduce swelling, and protect your heart, liver, and kidneys.  Most people on this plan should limit their sodium intake to 1,500-2,000 mg (milligrams) of sodium each day.  Canned, boxed, and frozen foods are high in sodium. Restaurant foods, fast foods, and pizza are also very high in sodium. You also get sodium by adding salt to food.  Try to cook at home, eat more fresh fruits and vegetables, and eat less fast food, canned, processed, or prepared foods. This information is not intended to replace advice given to you by your health care provider. Make sure you discuss any questions you have with your health care provider. Document Released: 07/03/2001 Document Revised: 01/05/2016 Document Reviewed: 01/05/2016 Elsevier Interactive Patient Education  2017 [employer].

## 2016-07-05 NOTE — Progress Notes (Signed)
Cardiology Office Note    Date:  07/05/2016   ID:  [name], DOB Aug 08, 1970, MRN [id number]  PCP:  Boyce Medici, FNP  Cardiologist: New  Chief Complaint  Patient presents with  . Pre-op Exam    History of Present Illness:  Denise [name] is a 46 y.o. female referred to [place] because of an abnormal EKG and she needs 2 teeth pulled. She has no prior cardiac history. She had history of syncope 01/2015 and CT of the head showed a possible acute infarct in the anterior left basal ganglia. 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 Patient's blood pressure was 175/115 at admission. She also has history of borderline diabetes and smoking history.  Patient comes in today alone. She doesn't know a lot of her medical history and her husband takes care of her medications. She ran out of her amlodipine and her blood pressure is a little high today. She denies any cardiac complaints. She has some pinprick laying chest pain at times but denies any exertional chest tightness, pressure, dyspnea, dyspnea on exertion, dizziness or presyncope. We did an EKG here and it is normal. Reviewed with Dr. Lovena Le and EKG from primary care had lead misplacement.    Past Medical History:  Diagnosis Date  . Abnormal EKG   . AD (atopic dermatitis)   . Alcohol use 04/14/2015  . Anxiety   . Blood transfusion without reported diagnosis   . Depression   . Fibroid uterus 12/18/2014  . Hyperlipemia   . Hypertension   . Memory changes   . Overactive bladder   . Pelvic pain in female 12/18/2014  . Prediabetes   . Right sided weakness   . Stroke ([place])   . Substance abuse   . Syncope 02/07/2015  . TIA (transient ischemic attack)   . Tobacco use 04/14/2015    Past Surgical History:  Procedure Laterality Date  . CESAREAN SECTION      Current Medications: Outpatient Medications Prior to Visit  Medication  Sig Dispense Refill  . amLODipine (NORVASC) 10 MG tablet Take 10 mg by mouth daily.    Marland Kitchen atorvastatin (LIPITOR) 10 MG tablet Take 10 mg by mouth at bedtime.  0  . cetirizine (ZYRTEC) 10 MG tablet Take 10 mg by mouth daily.    Marland Kitchen ibuprofen (ADVIL,MOTRIN) 200 MG tablet Take 400 mg by mouth every 6 (six) hours as needed for moderate pain.    Marland Kitchen lisinopril (PRINIVIL,ZESTRIL) 20 MG tablet Take 20 mg by mouth 2 (two) times daily.    Marland Kitchen omeprazole (PRILOSEC) 20 MG capsule Take 20 mg by mouth 2 (two) times daily before a meal.    . polyethylene glycol powder (GLYCOLAX/MIRALAX) powder Mix 17 g with 4-8 ounces of juice and drink once a day  0  . aspirin EC 325 MG tablet Take 325-975 mg by mouth See admin instructions. ONCE AS NEEDED FOR CHEST PAINS OR HEADACHES    . citalopram (CELEXA) 20 MG tablet Take 20 mg by mouth daily.    Marland Kitchen diltiazem 2 % GEL Apply 1 application topically 3 (three) times daily. 30 g 1  . meloxicam (MOBIC) 7.5 MG tablet Take 7.5 mg by mouth daily.    . mirabegron ER (MYRBETRIQ) 50 MG TB24 tablet Take 50 mg by mouth daily.    . ondansetron (ZOFRAN) 8 MG tablet Take by mouth at bedtime.    . prazosin (MINIPRESS) 2 MG capsule Take  2 mg by mouth at bedtime.    . topiramate (TOPAMAX) 50 MG tablet topiramate 25mg  (1/2 tablet) at bedtime for 7 days, then 50mg  at bedtime. 30 tablet 1  . traMADol (ULTRAM) 50 MG tablet Take 1 tablet (50 mg total) by mouth 2 (two) times daily. 60 tablet 0  . Triamcinolone Acetonide 0.025 % LOTN Apply 1 application topically daily. SHOULDERS AND ARMS  0  . triamterene-hydrochlorothiazide (MAXZIDE-25) 37.5-25 MG tablet Take 1 tablet by mouth daily.     No facility-administered medications prior to visit.      Allergies:   Patient has no known allergies.   Social History   Social History  . Marital status: Married    Spouse name: N/A  . Number of children: 3  . Years of education: N/A   Social History Main Topics  . Smoking status: Current Every Day  Smoker  . Smokeless tobacco: Never Used  . Alcohol use No     Comment: 2 a day  . Drug use: Yes    Types: "Crack" cocaine     Comment: JANUARY 2017  . Sexual activity: Yes    Birth control/ protection: None   Other Topics Concern  . None   Social History Narrative  . None     Family History:  The patient's   family history includes Heart disease in her maternal grandmother; Stroke in her mother.   ROS:   Please see the history of present illness.    Review of Systems  Constitution: Positive for weight gain.  HENT: Negative.   Eyes: Positive for visual disturbance.  Cardiovascular: Negative.   Respiratory: Positive for snoring and wheezing.   Hematologic/Lymphatic: Negative.   Musculoskeletal: Negative.  Negative for joint pain.  Gastrointestinal: Negative.   Genitourinary: Negative.   Neurological: Negative.   Psychiatric/Behavioral: Positive for depression.   All other systems reviewed and are negative.   PHYSICAL EXAM:   VS:  BP (!) 152/98   Pulse 76   Ht 5\' 5"  (1.651 m)   Wt 252 lb (114.3 kg)   BMI 41.93 kg/m   Physical Exam  GEN: Obese, in no acute distress  Neck: no JVD, carotid bruits, or masses Cardiac:RRR; positive S4, no murmurs, rubs Respiratory:  clear to auscultation bilaterally, normal work of breathing GI: soft, nontender, nondistended, + BS Ext: without cyanosis, clubbing, or edema, Good distal pulses bilaterally Neuro:  Alert and Oriented x 3 Psych: euthymic mood, full affect  Wt Readings from Last 3 Encounters:  07/05/16 252 lb (114.3 kg)  04/16/16 245 lb 9.6 oz (111.4 kg)  08/12/15 247 lb (112 kg)      Studies/Labs Reviewed:   EKG:  EKG is  ordered today.  The ekg ordered today demonstrates Normal sinus rhythm no acute change EKG reviewed from primary care and is abnormal because of inadequate lead placement  Recent Labs: 04/13/2016: ALT 43 06/14/2016: BUN 6; Creatinine, Ser 0.68; Hemoglobin 13.3; Platelets 270; Potassium 3.7; Sodium  139   Lipid Panel    Component Value Date/Time   CHOL 262 (H) 02/08/2015 0422   TRIG 352 (H) 02/08/2015 0422   HDL 35 (L) 02/08/2015 0422   CHOLHDL 7.5 02/08/2015 0422   VLDL 70 (H) 02/08/2015 0422   LDLCALC 157 (H) 02/08/2015 0422    Additional studies/ records that were reviewed today include:    2D Echo: 02/08/15 Left ventricle: The cavity size was normal. Wall thickness was   normal. Systolic function was normal. The estimated ejection  fraction was in the range of 60% to 65%. Wall motion was normal;   there were no regional wall motion abnormalities. Doppler   parameters are consistent with abnormal left ventricular   relaxation (grade 1 diastolic dysfunction). The E/e&' ratio is <8,   suggesting normal LV filling pressure. - Aortic valve: Trileaflet. Sclerosis without stenosis. There was   trivial regurgitation. - Left atrium: The atrium was normal in size.  Impressions:  - LVEF 60-65%, normal wall thickness, normal wall motion, diastolic   dysfunction, trivial AI, normal LA size.     ASSESSMENT:    1. Pre-operative clearance   2. Essential hypertension   3. Tobacco use   4. Syncope, unspecified syncope type      PLAN:  In order of problems listed above: Preoperative clearance to have tooth teeth pulled because of the abnormal EKG. EKG is abnormal because of leads placed strongly. EKG today in our office stable and normal. Discuss with Dr. Lovena Le who concurs that the patient is low risk to proceed with her teeth being pulled. She has no cardiac complaints. 2-D echo 01/2015 normal LVEF. She does have multiple cardiac risk factors that need modified and treated but can be followed by primary care. Follow-up with [place] as needed.  Essential hypertension patient's blood pressure is up a little today. She ran out of her amlodipine and needs to get it refilled. Recommend contact her pharmacy and/or primary care  Tobacco abuse smoking cessation discussed  History of  syncope January 2017 with possible hemorrhagic infarct likely secondary due uncontrolled hypertension patient signed out AMA.     Medication Adjustments/Labs and Tests Ordered: Current medicines are reviewed at length with the patient today.  Concerns regarding medicines are outlined above.  Medication changes, Labs and Tests ordered today are listed in the Patient Instructions below. Patient Instructions  Medication Instructions:    Your physician recommends that you continue on your current medications as directed. Please refer to the Current Medication list given to you today.   If you need a refill on your cardiac medications before your next appointment, please call your pharmacy.  Labwork: NONE ORDERED  TODAY    Testing/Procedures: NONE ORDERED  TODAY    Follow-Up:  CONTACT [facility] 336 [phone] AS NEEDED FOR  ANY CARDIAC RELATED SYMPTOMS    Any Other Special Instructions Will Be Listed Below (If Applicable).    Low-Sodium Eating Plan Sodium, which is an element that makes up salt, helps you maintain a healthy balance of fluids in your body. Too much sodium can increase your blood pressure and cause fluid and waste to be held in your body. Your health care provider or dietitian may recommend following this plan if you have high blood pressure (hypertension), kidney disease, liver disease, or heart failure. Eating less sodium can help lower your blood pressure, reduce swelling, and protect your heart, liver, and kidneys. What are tips for following this plan? General guidelines  Most people on this plan should limit their sodium intake to 1,500-2,000 mg (milligrams) of sodium each day. Reading food labels  The Nutrition Facts label lists the amount of sodium in one serving of the food. If you eat more than one serving, you must multiply the listed amount of sodium by the number of servings.  Choose foods with less than 140 mg of sodium per serving.  Avoid foods  with 300 mg of sodium or more per serving. Shopping  Look for lower-sodium products, often labeled as "low-sodium" or "  no salt added."  Always check the sodium content even if foods are labeled as "unsalted" or "no salt added".  Buy fresh foods. ? Avoid canned foods and premade or frozen meals. ? Avoid canned, cured, or processed meats  Buy breads that have less than 80 mg of sodium per slice. Cooking  Eat more home-cooked food and less restaurant, buffet, and fast food.  Avoid adding salt when cooking. Use salt-free seasonings or herbs instead of table salt or sea salt. Check with your health care provider or pharmacist before using salt substitutes.  Cook with plant-based oils, such as canola, sunflower, or olive oil. Meal planning  When eating at a restaurant, ask that your food be prepared with less salt or no salt, if possible.  Avoid foods that contain MSG (monosodium glutamate). MSG is sometimes added to [place] food, bouillon, and some canned foods. What foods are recommended? The items listed may not be a complete list. Talk with your dietitian about what dietary choices are best for you. Grains Low-sodium cereals, including oats, puffed wheat and rice, and shredded wheat. Low-sodium crackers. Unsalted rice. Unsalted pasta. Low-sodium bread. Whole-grain breads and whole-grain pasta. Vegetables Fresh or frozen vegetables. "No salt added" canned vegetables. "No salt added" tomato sauce and paste. Low-sodium or reduced-sodium tomato and vegetable juice. Fruits Fresh, frozen, or canned fruit. Fruit juice. Meats and other protein foods Fresh or frozen (no salt added) meat, poultry, seafood, and fish. Low-sodium canned tuna and salmon. Unsalted nuts. Dried peas, beans, and lentils without added salt. Unsalted canned beans. Eggs. Unsalted nut butters. Dairy Milk. Soy milk. Cheese that is naturally low in sodium, such as ricotta cheese, fresh mozzarella, or Swiss cheese Low-sodium  or reduced-sodium cheese. Cream cheese. Yogurt. Fats and oils Unsalted butter. Unsalted margarine with no trans fat. Vegetable oils such as canola or olive oils. Seasonings and other foods Fresh and dried herbs and spices. Salt-free seasonings. Low-sodium mustard and ketchup. Sodium-free salad dressing. Sodium-free light mayonnaise. Fresh or refrigerated horseradish. Lemon juice. Vinegar. Homemade, reduced-sodium, or low-sodium soups. Unsalted popcorn and pretzels. Low-salt or salt-free chips. What foods are not recommended? The items listed may not be a complete list. Talk with your dietitian about what dietary choices are best for you. Grains Instant hot cereals. Bread stuffing, pancake, and biscuit mixes. Croutons. Seasoned rice or pasta mixes. Noodle soup cups. Boxed or frozen macaroni and cheese. Regular salted crackers. Self-rising flour. Vegetables Sauerkraut, pickled vegetables, and relishes. Olives. [place] fries. Onion rings. Regular canned vegetables (not low-sodium or reduced-sodium). Regular canned tomato sauce and paste (not low-sodium or reduced-sodium). Regular tomato and vegetable juice (not low-sodium or reduced-sodium). Frozen vegetables in sauces. Meats and other protein foods Meat or fish that is salted, canned, smoked, spiced, or pickled. Bacon, ham, sausage, hotdogs, corned beef, chipped beef, packaged lunch meats, salt pork, jerky, pickled herring, anchovies, regular canned tuna, sardines, salted nuts. Dairy Processed cheese and cheese spreads. Cheese curds. Blue cheese. Feta cheese. String cheese. Regular cottage cheese. Buttermilk. Canned milk. Fats and oils Salted butter. Regular margarine. Ghee. Bacon fat. Seasonings and other foods Onion salt, garlic salt, seasoned salt, table salt, and sea salt. Canned and packaged gravies. Worcestershire sauce. Tartar sauce. Barbecue sauce. Teriyaki sauce. Soy sauce, including reduced-sodium. Steak sauce. Fish sauce. Oyster sauce.  Cocktail sauce. Horseradish that you find on the shelf. Regular ketchup and mustard. Meat flavorings and tenderizers. Bouillon cubes. Hot sauce and Tabasco sauce. Premade or packaged marinades. Premade or packaged taco seasonings. Relishes. Regular salad dressings.  Salsa. Potato and tortilla chips. Corn chips and puffs. Salted popcorn and pretzels. Canned or dried soups. Pizza. Frozen entrees and pot pies. Summary  Eating less sodium can help lower your blood pressure, reduce swelling, and protect your heart, liver, and kidneys.  Most people on this plan should limit their sodium intake to 1,500-2,000 mg (milligrams) of sodium each day.  Canned, boxed, and frozen foods are high in sodium. Restaurant foods, fast foods, and pizza are also very high in sodium. You also get sodium by adding salt to food.  Try to cook at home, eat more fresh fruits and vegetables, and eat less fast food, canned, processed, or prepared foods. This information is not intended to replace advice given to you by your health care provider. Make sure you discuss any questions you have with your health care provider. Document Released: 07/03/2001 Document Revised: 01/05/2016 Document Reviewed: 01/05/2016 Elsevier Interactive Patient Education  2017 [place], Ermalinda Barrios, [place]  07/05/2016 1:35 PM    [facility] [place], [place],   [address] Phone: [phone]; Fax: [phone]

## 2016-07-15 ENCOUNTER — Encounter: Payer: Self-pay | Admitting: Obstetrics & Gynecology

## 2016-07-15 ENCOUNTER — Ambulatory Visit (INDEPENDENT_AMBULATORY_CARE_PROVIDER_SITE_OTHER): Payer: Medicaid Other | Admitting: Obstetrics & Gynecology

## 2016-07-15 VITALS — BP 169/78 | HR 69 | Ht 65.0 in | Wt 249.1 lb

## 2016-07-15 DIAGNOSIS — R102 Pelvic and perineal pain: Secondary | ICD-10-CM | POA: Diagnosis present

## 2016-07-15 DIAGNOSIS — D252 Subserosal leiomyoma of uterus: Secondary | ICD-10-CM

## 2016-07-15 NOTE — Progress Notes (Signed)
Subjective:     Patient ID: Denise [name], female   DOB: February 07, 1970, 46 y.o.   MRN: [id number] Cc; pelvic pain and urinary sx [id number] Patient's last menstrual period was 07/09/2016 (exact date). Menses are infrequent and light. She had VMS c/w perimenopause. She saw urologist recently and was prescribed Ditropan for overactive bladder and is to f/u there in 4 weeks. CT showed calcified fibroid. Concern is that fibroids are a factor in her pelvic sx.  Past Medical History:  Diagnosis Date  . Abnormal EKG   . AD (atopic dermatitis)   . Alcohol use 04/14/2015  . Anxiety   . Blood transfusion without reported diagnosis   . Depression   . Fibroid uterus 12/18/2014  . Hyperlipemia   . Hypertension   . Memory changes   . Overactive bladder   . Pelvic pain in female 12/18/2014  . Prediabetes   . Right sided weakness   . Stroke ([place])   . Substance abuse   . Syncope 02/07/2015  . TIA (transient ischemic attack)   . Tobacco use 04/14/2015   Past Surgical History:  Procedure Laterality Date  . CESAREAN SECTION     No Known Allergies Current Outpatient Prescriptions on File Prior to Visit  Medication Sig Dispense Refill  . aspirin EC 81 MG tablet Take 81 mg by mouth daily.    Marland Kitchen atorvastatin (LIPITOR) 10 MG tablet Take 10 mg by mouth at bedtime.  0  . cetirizine (ZYRTEC) 10 MG tablet Take 10 mg by mouth daily.    Marland Kitchen FLUoxetine (PROZAC) 20 MG capsule Take 20 mg by mouth daily.  0  . lisinopril (PRINIVIL,ZESTRIL) 20 MG tablet Take 20 mg by mouth 2 (two) times daily.    . polyethylene glycol powder (GLYCOLAX/MIRALAX) powder Mix 17 g with 4-8 ounces of juice and drink once a day  0  . prazosin (MINIPRESS) 1 MG capsule Take 1 mg by mouth daily.  0  . amLODipine (NORVASC) 10 MG tablet Take 10 mg by mouth daily.    Marland Kitchen ibuprofen (ADVIL,MOTRIN) 200 MG tablet Take 400 mg by mouth every 6 (six) hours as needed for moderate pain.    Marland Kitchen omeprazole (PRILOSEC) 20 MG capsule Take 20 mg by mouth 2  (two) times daily before a meal.     No current facility-administered medications on file prior to visit.     Review of Systems  Constitutional: Negative.   Gastrointestinal: Negative.   Genitourinary: Positive for dysuria, frequency and pelvic pain. Negative for vaginal bleeding and vaginal discharge.       Objective:   Physical Exam  Constitutional: She appears well-developed.  Obese   Cardiovascular: Normal rate.   Pulmonary/Chest: Effort normal.  Psychiatric: She has a normal mood and affect. Her behavior is normal.  Vitals reviewed.      Assessment:     Patient Active Problem List   Diagnosis Date Noted  . Tobacco use 04/14/2015  . Alcohol use 04/14/2015  . Right sided weakness   . Syncope 02/07/2015  . Pelvic pain in female 12/18/2014  . Fibroid uterus 12/18/2014  . Hypertension 12/18/2014       Plan:     F/u pelvic US Will see urology and I discussed that IC is a possible diagnosis with her pain and urinary symptoms  Woodroe Mode, MD 07/15/2016

## 2016-07-15 NOTE — Patient Instructions (Signed)
Urinary Frequency, Adult Urinary frequency means urinating more often than usual. People with urinary frequency urinate at least 8 times in 24 hours, even if they drink a normal amount of fluid. Although they urinate more often than normal, the total amount of urine produced in a day may be normal. Urinary frequency is also called pollakiuria. What are the causes? This condition may be caused by:  A urinary tract infection.  Obesity.  Bladder problems, such as bladder stones.  Caffeine or alcohol.  Eating food or drinking fluids that irritate the bladder. These include coffee, tea, soda, artificial sweeteners, citrus, tomato-based foods, and chocolate.  Certain medicines, such as medicines that help the body get rid of extra fluid (diuretics).  Muscle or nerve weakness.  Overactive bladder.  Chronic diabetes.  Interstitial cystitis.  In men, problems with the prostate, such as an enlarged prostate.  In women, pregnancy.  In some cases, the cause may not be known. What increases the risk? This condition is more likely to develop in:  Women who have gone through menopause.  Men with prostate problems.  People with a disease or injury that affects the nerves or spinal cord.  People who have or have had a condition that affects the brain, such as a stroke.  What are the signs or symptoms? Symptoms of this condition include:  Feeling an urgent need to urinate often. The stress and anxiety of needing to find a bathroom quickly can make this urge worse.  Urinating 8 or more times in 24 hours.  Urinating as often as every 1 to 2 hours.  How is this diagnosed? This condition is diagnosed based on your symptoms, your medical history, and a physical exam. You may have tests, such as:  Blood tests.  Urine tests.  Imaging tests, such as X-rays or ultrasounds.  A bladder test.  A test of your neurological system. This is the body system that senses the need to  urinate.  A test to check for problems in the urethra and bladder called cystoscopy.  You may also be asked to keep a bladder diary. A bladder diary is a record of what you eat and drink, how often you urinate, and how much you urinate. You may need to see a health care provider who specializes in conditions of the urinary tract (urologist) or kidneys (nephrologist). How is this treated? Treatment for this condition depends on the cause. Sometimes the condition goes away on its own and treatment is not necessary. If treatment is needed, it may include:  Taking medicine.  Learning exercises that strengthen the muscles that help control urination.  Following a bladder training program. This may include: ? Learning to delay going to the bathroom. ? Double urinating (voiding). This helps if you are not completely emptying your bladder. ? Scheduled voiding.  Making diet changes, such as: ? Avoiding caffeine. ? Drinking fewer fluids, especially alcohol. ? Not drinking in the evening. ? Not having foods or drinks that may irritate the bladder. ? Eating foods that help prevent or ease constipation. Constipation can make this condition worse.  Having the nerves in your bladder stimulated. There are two options for stimulating the nerves to your bladder: ? Outpatient electrical nerve stimulation. This is done by your health care provider. ? Surgery to implant a bladder pacemaker. The pacemaker helps to control the urge to urinate.  Follow these instructions at home:  Keep a bladder diary if told to by your health care provider.  Take over-the-counter   and prescription medicines only as told by your health care provider.  Do any exercises as told by your health care provider.  Follow a bladder training program as told by your health care provider.  Make any recommended diet changes.  Keep all follow-up visits as told by your health care provider. This is important. Contact a health care  provider if:  You start urinating more often.  You feel pain or irritation when you urinate.  You notice blood in your urine.  Your urine looks cloudy.  You develop a fever.  You begin vomiting. Get help right away if:  You are unable to urinate. This information is not intended to replace advice given to you by your health care provider. Make sure you discuss any questions you have with your health care provider. Document Released: 11/07/2008 Document Revised: 02/12/2015 Document Reviewed: 08/07/2014 Elsevier Interactive Patient Education  2018 [employer]. Uterine Fibroids Uterine fibroids are tissue masses (tumors) that can develop in the womb (uterus). They are also called leiomyomas. This type of tumor is not cancerous (benign) and does not spread to other parts of the body outside of the pelvic area, which is between the hip bones. Occasionally, fibroids may develop in the fallopian tubes, in the cervix, or on the support structures (ligaments) that surround the uterus. You can have one or many fibroids. Fibroids can vary in size, weight, and where they grow in the uterus. Some can become quite large. Most fibroids do not require medical treatment. What are the causes? A fibroid can develop when a single uterine cell keeps growing (replicating). Most cells in the human body have a control mechanism that keeps them from replicating without control. What are the signs or symptoms? Symptoms may include:  Heavy bleeding during your period.  Bleeding or spotting between periods.  Pelvic pain and pressure.  Bladder problems, such as needing to urinate more often (urinary frequency) or urgently.  Inability to reproduce offspring (infertility).  Miscarriages.  How is this diagnosed? Uterine fibroids are diagnosed through a physical exam. Your health care provider may feel the lumpy tumors during a pelvic exam. Ultrasonography and an MRI may be done to determine the size,  location, and number of fibroids. How is this treated? Treatment may include:  Watchful waiting. This involves getting the fibroid checked by your health care provider to see if it grows or shrinks. Follow your health care provider's recommendations for how often to have this checked.  Hormone medicines. These can be taken by mouth or given through an intrauterine device (IUD).  Surgery. ? Removing the fibroids (myomectomy) or the uterus (hysterectomy). ? Removing blood supply to the fibroids (uterine artery embolization).  If fibroids interfere with your fertility and you want to become pregnant, your health care provider may recommend having the fibroids removed. Follow these instructions at home:  Keep all follow-up visits as directed by your health care provider. This is important.  Take over-the-counter and prescription medicines only as told by your health care provider. ? If you were prescribed a hormone treatment, take the hormone medicines exactly as directed.  Ask your health care provider about taking iron pills and increasing the amount of dark green, leafy vegetables in your diet. These actions can help to boost your blood iron levels, which may be affected by heavy menstrual bleeding.  Pay close attention to your period and tell your health care provider about any changes, such as: ? Increased blood flow that requires you to use more  pads or tampons than usual per month. ? A change in the number of days that your period lasts per month. ? A change in symptoms that are associated with your period, such as abdominal cramping or back pain. Contact a health care provider if:  You have pelvic pain, back pain, or abdominal cramps that cannot be controlled with medicines.  You have an increase in bleeding between and during periods.  You soak tampons or pads in a half hour or less.  You feel lightheaded, extra tired, or weak. Get help right away if:  You faint.  You have  a sudden increase in pelvic pain. This information is not intended to replace advice given to you by your health care provider. Make sure you discuss any questions you have with your health care provider. Document Released: 01/09/2000 Document Revised: 09/11/2015 Document Reviewed: 07/10/2013 Elsevier Interactive Patient Education  [employer].

## 2016-07-23 ENCOUNTER — Ambulatory Visit (HOSPITAL_COMMUNITY)
Admission: RE | Admit: 2016-07-23 | Discharge: 2016-07-23 | Disposition: A | Discharge status: Home / Self Care | Payer: Medicaid Other | Source: Ambulatory Visit | Attending: Obstetrics & Gynecology | Admitting: Obstetrics & Gynecology

## 2016-07-23 DIAGNOSIS — D252 Subserosal leiomyoma of uterus: Secondary | ICD-10-CM | POA: Diagnosis present

## 2016-07-23 DIAGNOSIS — R102 Pelvic and perineal pain: Secondary | ICD-10-CM | POA: Diagnosis not present

## 2016-08-10 ENCOUNTER — Telehealth: Payer: Self-pay | Admitting: Gastroenterology

## 2016-08-10 NOTE — Telephone Encounter (Signed)
Patient states that she has been having issues with vomiting for a year, has seen her PCP and was given omeprazole. I have scheduled her with Dr. Loletha Carrow at his first available appointment. Patient was here in March 2018 and did not mention this to APP at that time.

## 2016-08-26 ENCOUNTER — Ambulatory Visit: Payer: Medicaid Other | Admitting: Obstetrics & Gynecology

## 2016-09-14 ENCOUNTER — Ambulatory Visit (INDEPENDENT_AMBULATORY_CARE_PROVIDER_SITE_OTHER): Payer: Medicaid Other | Admitting: Gastroenterology

## 2016-09-14 ENCOUNTER — Encounter (INDEPENDENT_AMBULATORY_CARE_PROVIDER_SITE_OTHER): Payer: Self-pay

## 2016-09-14 ENCOUNTER — Encounter: Payer: Self-pay | Admitting: Gastroenterology

## 2016-09-14 VITALS — BP 136/82 | HR 80 | Ht 64.0 in | Wt 240.4 lb

## 2016-09-14 DIAGNOSIS — R1115 Cyclical vomiting syndrome unrelated to migraine: Secondary | ICD-10-CM

## 2016-09-14 DIAGNOSIS — K5904 Chronic idiopathic constipation: Secondary | ICD-10-CM

## 2016-09-14 DIAGNOSIS — R1084 Generalized abdominal pain: Secondary | ICD-10-CM

## 2016-09-14 DIAGNOSIS — G43A0 Cyclical vomiting, not intractable: Secondary | ICD-10-CM

## 2016-09-14 MED ORDER — ONDANSETRON HCL 4 MG PO TABS: 4.0000 mg | ORAL_TABLET | Freq: Four times a day (QID) | ORAL | 1 refills | Status: DC | PRN

## 2016-09-14 NOTE — Patient Instructions (Signed)
You have been scheduled for an endoscopy. Please follow written instructions given to you at your visit today. If you use inhalers (even only as needed), please bring them with you on the day of your procedure. Your physician has requested that you go to www.startemmi.com and enter the access code given to you at your visit today. This web site gives a general overview about your procedure. However, you should still follow specific instructions given to you by our office regarding your preparation for the procedure.  If you are age 76 or older, your body mass index should be between 23-30. Your Body mass index is 41.26 kg/m. If this is out of the aforementioned range listed, please consider follow up with your Primary Care Provider.  If you are age 29 or younger, your body mass index should be between 19-25. Your Body mass index is 41.26 kg/m. If this is out of the aformentioned range listed, please consider follow up with your Primary Care Provider.   Thank you for choosing [facility]  Dr Wilfrid Lund III

## 2016-09-14 NOTE — Progress Notes (Signed)
[facility] Progress Note  Chief Complaint: Nausea and vomiting  Subjective  History:  This is a 46 year old woman that I last saw in April 2017. She has long-standing abdominal pain with tendencies to constipation and nausea. She reports that for at least the last year she has had frequent episodes of vomiting in the morning. Typically, very soon after awakening, she will go to either urinate or have a BM. Usually during or after bowel movement begins, she will have sudden onset of severe vomiting in a spasm that will last 15-20 minutes. Her husband says she will gag and retch so bad that he cannot stand to here it and will go outside. She reports that her bowel movements are now regular taking the MiraLAX recommended last year. She tends to have generalized abdominal pain on and off throughout the day. Her appetite is reasonably good, she eats small volume multiple times a day. She is concerned about weight loss. I note that her weight was 247 pounds in January 2017, then 245 pounds in March 2018, 252 pounds in June 2018, now 240 pounds.  Of note, she was consuming three 40-ounce beers per day until her husband recently convinced her to cut down to 1 per day. She has had no beer for the last week, but the upper digestive symptoms continue. She also has multiple ED visits for chest pain, including one in April 2018 where she had associated headache, numbness in the left arm, and a constellation of other neurologic symptoms for which she has been evaluated by neurology in the past.   ROS: Cardiovascular:  no chest pain Respiratory: no dyspnea  The patient's Past Medical, Family and Social History were reviewed and are on file in the EMR. Current smoker Vision change, various numbness and tingling ED visit 5/18 for CP, HA, numbness L arm - has seen neurology  remainder of systems  Neg except as above.  Objective:  Med list reviewed  Vital signs in last 24 hrs: Vitals:   09/14/16 0825  BP: 136/82  Pulse: 80    Physical Exam  Obese woman in no acute distress  HEENT: sclera anicteric, oral mucosa moist without lesions  Neck: supple, no thyromegaly, JVD or lymphadenopathy  Cardiac: RRR without murmurs, S1S2 heard, no peripheral edema  Pulm: clear to auscultation bilaterally, normal RR and effort noted  Abdomen: soft, mild tenderness to light palpation of entire abdominal wall, with active bowel sounds. No guarding or palpable hepatosplenomegaly.  Skin; warm and dry, no jaundice or rash  Recent Labs:  CBC Latest Ref Rng & Units 06/14/2016 04/13/2016 08/12/2015  WBC 4.0 - 10.5 K/uL 8.4 9.2 -  Hemoglobin 12.0 - 15.0 g/dL 13.3 14.8 15.6(H)  Hematocrit 36.0 - 46.0 % 39.6 42.5 46.0  Platelets 150 - 400 K/uL 270 336 -   BMP Latest Ref Rng & Units 06/14/2016 04/13/2016 08/12/2015  Glucose 65 - 99 mg/dL 97 96 106(H)  BUN 6 - 20 mg/dL 6 8 9   Creatinine 0.44 - 1.00 mg/dL 0.68 0.68 0.80  Sodium 135 - 145 mmol/L 139 138 140  Potassium 3.5 - 5.1 mmol/L 3.7 3.7 4.5  Chloride 101 - 111 mmol/L 109 108 108  CO2 22 - 32 mmol/L 23 20(L) -  Calcium 8.9 - 10.3 mg/dL 9.1 9.4 -     Radiologic studies:  CTAP 04/2015: fibroids and diverticulosis CTAP 03/2016: no change  Colonoscopy nml x tics 6/17  @ASSESSMENTPLANBEGIN @ Assessment: Encounter Diagnoses  Name Primary?  . Non-intractable  cyclical vomiting with nausea Yes  . Generalized abdominal pain   . Chronic idiopathic constipation   I am most suspicious of a functional bowel disorder, obstructive or inflammatory or ulcer causes in the upper GI tract seem less likely.  Plan: EGD As needed Zofran   Total time 30 minutes, over half spent in counseling and coordination of care.   Nelida Meuse III   [facility] - Dr. Leslie Andrea

## 2016-09-28 ENCOUNTER — Ambulatory Visit (AMBULATORY_SURGERY_CENTER): Payer: Self-pay | Admitting: Gastroenterology

## 2016-09-28 ENCOUNTER — Encounter: Payer: Self-pay | Admitting: Gastroenterology

## 2016-09-28 VITALS — BP 149/96 | HR 78 | Temp 98.0°F | Resp 15 | Ht 64.0 in | Wt 240.0 lb

## 2016-09-28 DIAGNOSIS — G43A0 Cyclical vomiting, not intractable: Secondary | ICD-10-CM

## 2016-09-28 DIAGNOSIS — R1115 Cyclical vomiting syndrome unrelated to migraine: Secondary | ICD-10-CM

## 2016-09-28 DIAGNOSIS — K2970 Gastritis, unspecified, without bleeding: Secondary | ICD-10-CM

## 2016-09-28 DIAGNOSIS — K2980 Duodenitis without bleeding: Secondary | ICD-10-CM

## 2016-09-28 MED ORDER — SODIUM CHLORIDE 0.9 % IV SOLN: 500.0000 mL | INTRAVENOUS | Status: DC

## 2016-09-28 NOTE — Progress Notes (Signed)
Report given to PACU, vss 

## 2016-09-28 NOTE — Patient Instructions (Signed)
Impression/Recommendations:  Gastritis handout given to patient.  Resume previous diet. Continue present medications.  Avoid aspirin, ibuprofen, naproxen, or other NSAID drugs.  Await pathology results.  YOU HAD AN ENDOSCOPIC PROCEDURE TODAY AT [facility]:   Refer to the procedure report that was given to you for any specific questions about what was found during the examination.  If the procedure report does not answer your questions, please call your gastroenterologist to clarify.  If you requested that your care partner not be given the details of your procedure findings, then the procedure report has been included in a sealed envelope for you to review at your convenience later.  YOU SHOULD EXPECT: Some feelings of bloating in the abdomen. Passage of more gas than usual.  Walking can help get rid of the air that was put into your GI tract during the procedure and reduce the bloating. If you had a lower endoscopy (such as a colonoscopy or flexible sigmoidoscopy) you may notice spotting of blood in your stool or on the toilet paper. If you underwent a bowel prep for your procedure, you may not have a normal bowel movement for a few days.  Please Note:  You might notice some irritation and congestion in your nose or some drainage.  This is from the oxygen used during your procedure.  There is no need for concern and it should clear up in a day or so.  SYMPTOMS TO REPORT IMMEDIATELY:  Following upper endoscopy (EGD)  Vomiting of blood or coffee ground material  New chest pain or pain under the shoulder blades  Painful or persistently difficult swallowing  New shortness of breath  Fever of 100F or higher  Black, tarry-looking stools  For urgent or emergent issues, a gastroenterologist can be reached at any hour by calling [phone].   DIET:  We do recommend a small meal at first, but then you may proceed to your regular diet.  Drink plenty of fluids but you should  avoid alcoholic beverages for 24 hours.  ACTIVITY:  You should plan to take it easy for the rest of today and you should NOT DRIVE or use heavy machinery until tomorrow (because of the sedation medicines used during the test).    FOLLOW UP: Our staff will call the number listed on your records the next business day following your procedure to check on you and address any questions or concerns that you may have regarding the information given to you following your procedure. If we do not reach you, we will leave a message.  However, if you are feeling well and you are not experiencing any problems, there is no need to return our call.  We will assume that you have returned to your regular daily activities without incident.  If any biopsies were taken you will be contacted by phone or by letter within the next 1-3 weeks.  Please call us at [phone] if you have not heard about the biopsies in 3 weeks.    SIGNATURES/CONFIDENTIALITY: You and/or your care partner have signed paperwork which will be entered into your electronic medical record.  These signatures attest to the fact that that the information above on your After Visit Summary has been reviewed and is understood.  Full responsibility of the confidentiality of this discharge information lies with you and/or your care-partner.

## 2016-09-28 NOTE — Op Note (Signed)
[facility] Patient Name: Margareta Humphrey-headen Procedure Date: 09/28/2016 2:18 PM MRN: [id number] Endoscopist: Mallie Mussel L. Loletha Carrow , MD Age: 46 Referring MD:  Date of Birth: [birth year] Gender: Female Account #: [id number] Procedure:                Upper GI endoscopy Indications:              Persistent vomiting Medicines:                Monitored Anesthesia Care Procedure:                Pre-Anesthesia Assessment:                           - Prior to the procedure, a History and Physical                            was performed, and patient medications and                            allergies were reviewed. The patient's tolerance of                            previous anesthesia was also reviewed. The risks                            and benefits of the procedure and the sedation                            options and risks were discussed with the patient.                            All questions were answered, and informed consent                            was obtained. Prior Anticoagulants: The patient has                            taken no previous anticoagulant or antiplatelet                            agents. ASA Grade Assessment: III - A patient with                            severe systemic disease. After reviewing the risks                            and benefits, the patient was deemed in                            satisfactory condition to undergo the procedure.                           After obtaining informed consent, the endoscope was  passed under direct vision. Throughout the                            procedure, the patient's blood pressure, pulse, and                            oxygen saturations were monitored continuously. The                            Endoscope was introduced through the mouth, and                            advanced to the second part of duodenum. The upper                            GI endoscopy was  accomplished without difficulty.                            The patient tolerated the procedure well. Scope In: Scope Out: Findings:                 The larynx was normal.                           The esophagus was normal.                           Diffuse mild inflammation characterized by erythema                            was found in the gastric fundus and in the gastric                            antrum. Biopsies were taken with a cold forceps for                            histology. (Sidney protocol).                           The cardia and gastric fundus were normal on                            retroflexion.                           Scattered mild inflammation characterized by                            erosions and erythema was found in the entire                            duodenum. Complications:            No immediate complications. Estimated Blood Loss:     Estimated blood loss was minimal. Impression:               - Normal  larynx.                           - Normal esophagus.                           - Gastritis. Biopsied to rule out H. pylori.                           - Duodenitis.                           Symptoms have significantly improved after recent                            discontinuation of alcohol use and with as-needed                            use of ondansetron. Recommendation:           - Patient has a contact number available for                            emergencies. The signs and symptoms of potential                            delayed complications were discussed with the                            patient. Return to normal activities tomorrow.                            Written discharge instructions were provided to the                            patient.                           - Resume previous diet.                           - Continue present medications.                           - Avoid aspirin, ibuprofen, naproxen, or other                             non-steroidal anti-inflammatory drugs.                           - Await pathology results. Ronie Barnhart L. Loletha Carrow, MD 09/28/2016 2:36:23 PM This report has been signed electronically.

## 2016-09-28 NOTE — Progress Notes (Signed)
Called to room to assist during endoscopic procedure.  Patient ID and intended procedure confirmed with present staff. Received instructions for my participation in the procedure from the performing physician.  

## 2016-09-29 ENCOUNTER — Telehealth: Payer: Self-pay | Admitting: *Deleted

## 2016-09-29 NOTE — Telephone Encounter (Signed)
  Follow up Call-  Call back number 09/28/2016 07/04/2015  Post procedure Call Back phone  # [phone]  Permission to leave phone message Yes Yes     Patient questions:  Do you have a fever, pain , or abdominal swelling? No. Pain Score  0 *  Have you tolerated food without any problems? Yes.    Have you been able to return to your normal activities? Yes.    Do you have any questions about your discharge instructions: Diet   No. Medications  No. Follow up visit  No.  Do you have questions or concerns about your Care? No.  Actions: * If pain score is 4 or above: No action needed, pain <4.

## 2016-09-30 ENCOUNTER — Encounter: Payer: Self-pay | Admitting: Gastroenterology

## 2016-12-11 ENCOUNTER — Emergency Department (HOSPITAL_COMMUNITY): Payer: Medicaid Other

## 2016-12-11 ENCOUNTER — Encounter (HOSPITAL_COMMUNITY): Payer: Self-pay

## 2016-12-11 ENCOUNTER — Emergency Department (HOSPITAL_COMMUNITY)
Admission: EM | Admit: 2016-12-11 | Discharge: 2016-12-11 | Disposition: A | Discharge status: Home / Self Care | Payer: Medicaid Other | Attending: Emergency Medicine | Admitting: Emergency Medicine

## 2016-12-11 ENCOUNTER — Other Ambulatory Visit: Payer: Self-pay

## 2016-12-11 DIAGNOSIS — I10 Essential (primary) hypertension: Secondary | ICD-10-CM | POA: Insufficient documentation

## 2016-12-11 DIAGNOSIS — Z7982 Long term (current) use of aspirin: Secondary | ICD-10-CM | POA: Diagnosis not present

## 2016-12-11 DIAGNOSIS — R4189 Other symptoms and signs involving cognitive functions and awareness: Secondary | ICD-10-CM | POA: Diagnosis not present

## 2016-12-11 DIAGNOSIS — Z79899 Other long term (current) drug therapy: Secondary | ICD-10-CM | POA: Diagnosis not present

## 2016-12-11 DIAGNOSIS — F17200 Nicotine dependence, unspecified, uncomplicated: Secondary | ICD-10-CM | POA: Insufficient documentation

## 2016-12-11 DIAGNOSIS — R4182 Altered mental status, unspecified: Secondary | ICD-10-CM | POA: Diagnosis present

## 2016-12-11 LAB — RAPID URINE DRUG SCREEN, HOSP PERFORMED
Amphetamines: NOT DETECTED
Barbiturates: NOT DETECTED
Benzodiazepines: NOT DETECTED
COCAINE: NOT DETECTED
OPIATES: NOT DETECTED
Tetrahydrocannabinol: NOT DETECTED

## 2016-12-11 LAB — URINALYSIS, ROUTINE W REFLEX MICROSCOPIC
Bilirubin Urine: NEGATIVE
GLUCOSE, UA: NEGATIVE mg/dL
Hgb urine dipstick: NEGATIVE
Ketones, ur: NEGATIVE mg/dL
LEUKOCYTES UA: NEGATIVE
NITRITE: NEGATIVE
PH: 5 (ref 5.0–8.0)
Protein, ur: NEGATIVE mg/dL
SPECIFIC GRAVITY, URINE: 1.019 (ref 1.005–1.030)

## 2016-12-11 LAB — CBG MONITORING, ED
GLUCOSE-CAPILLARY: 89 mg/dL (ref 65–99)
GLUCOSE-CAPILLARY: 86 mg/dL (ref 65–99)

## 2016-12-11 LAB — COMPREHENSIVE METABOLIC PANEL
ALK PHOS: 94 U/L (ref 38–126)
ALT: 30 U/L (ref 14–54)
AST: 32 U/L (ref 15–41)
Albumin: 3.8 g/dL (ref 3.5–5.0)
Anion gap: 9 (ref 5–15)
BILIRUBIN TOTAL: 0.3 mg/dL (ref 0.3–1.2)
BUN: 7 mg/dL (ref 6–20)
CALCIUM: 8.7 mg/dL — AB (ref 8.9–10.3)
CO2: 25 mmol/L (ref 22–32)
Chloride: 108 mmol/L (ref 101–111)
Creatinine, Ser: 0.69 mg/dL (ref 0.44–1.00)
GFR calc non Af Amer: >60 mL/min (ref >60)
GLUCOSE: 95 mg/dL (ref 65–99)
POTASSIUM: 3.8 mmol/L (ref 3.5–5.1)
SODIUM: 142 mmol/L (ref 135–145)
TOTAL PROTEIN: 7.6 g/dL (ref 6.5–8.1)

## 2016-12-11 LAB — CBC
HEMATOCRIT: 42 % (ref 36.0–46.0)
HEMOGLOBIN: 14.3 g/dL (ref 12.0–15.0)
MCH: 30.4 pg (ref 26.0–34.0)
MCHC: 34 g/dL (ref 30.0–36.0)
MCV: 89.2 fL (ref 78.0–100.0)
Platelets: 333 10*3/uL (ref 150–400)
RBC: 4.71 MIL/uL (ref 3.87–5.11)
RDW: 14 % (ref 11.5–15.5)
WBC: 7.2 10*3/uL (ref 4.0–10.5)

## 2016-12-11 NOTE — Discharge Instructions (Signed)
It was our pleasure to provide your ER care today - we hope that you feel better.  Follow up with your neurologist this coming week - call office Monday morning to arrange appointment.  Return to ER if worse, new symptoms, fevers, trouble breathing, new numbness/weakness, or other medical emergency.

## 2016-12-11 NOTE — ED Provider Notes (Signed)
[facility] EMERGENCY DEPARTMENT Provider Note   CSN: [id number] Arrival date & time: 12/11/16  1205     History   Chief Complaint Chief Complaint  Patient presents with  . Seizures  . Altered Mental Status    HPI Nico Rogness is a 46 y.o. female.  Patient presents via ems with change in mental status and shaking episode. Pt noted to be starring off into space, and then turning head side to side.  Pt very poor historian, unresponsive and/or uncooperative with majority of questions asked - level 5 caveat.   No report of trauma or fall. No incontinence noted. Ems denies witnessing seizure activity.    The history is provided by the patient and the EMS personnel. The history is limited by the condition of the patient.  Seizures   Pertinent negatives include no headaches.  Altered Mental Status   Associated symptoms include seizures.    Past Medical History:  Diagnosis Date  . Abnormal EKG   . AD (atopic dermatitis)   . Alcohol use 04/14/2015  . Anxiety   . Blood transfusion without reported diagnosis   . Depression   . Fibroid uterus 12/18/2014  . Hyperlipemia   . Hypertension   . Memory changes   . Overactive bladder   . Pelvic pain in female 12/18/2014  . Prediabetes   . Right sided weakness   . Stroke ([place])   . Substance abuse ([place])   . Syncope 02/07/2015  . TIA (transient ischemic attack)   . Tobacco use 04/14/2015    Patient Active Problem List   Diagnosis Date Noted  . Tobacco use 04/14/2015  . Alcohol use 04/14/2015  . Right sided weakness   . Syncope 02/07/2015  . Pelvic pain in female 12/18/2014  . Fibroid uterus 12/18/2014  . Hypertension 12/18/2014    Past Surgical History:  Procedure Laterality Date  . CESAREAN SECTION     x 2    OB History    Gravida Para Term Preterm AB Living   3 3       3    SAB TAB Ectopic Multiple Live Births                   Home Medications    Prior to Admission medications     Medication Sig Start Date End Date Taking? Authorizing Provider  amLODipine (NORVASC) 10 MG tablet Take 10 mg by mouth daily.    [provider]  aspirin EC 81 MG tablet Take 81 mg by mouth daily.    [provider]  atorvastatin (LIPITOR) 10 MG tablet Take 10 mg by mouth at bedtime. 06/29/15   [provider]  cetirizine (ZYRTEC) 10 MG tablet Take 10 mg by mouth daily.    [provider]  FLUoxetine (PROZAC) 20 MG capsule Take 20 mg by mouth daily. 06/30/16   [provider]  ibuprofen (ADVIL,MOTRIN) 200 MG tablet Take 400 mg by mouth every 6 (six) hours as needed for moderate pain.    [provider]  lisinopril (PRINIVIL,ZESTRIL) 20 MG tablet Take 20 mg by mouth 2 (two) times daily.    [provider]  ondansetron (ZOFRAN) 4 MG tablet Take 1 tablet (4 mg total) by mouth every 6 (six) hours as needed for nausea or vomiting. 09/14/16   Doran Stabler, MD  polyethylene glycol powder (GLYCOLAX/MIRALAX) powder Mix 17 g with 4-8 ounces of juice and drink once a day 06/26/15   [provider]  prazosin (MINIPRESS) 1 MG capsule Take 1 mg by mouth daily. 07/01/16   [provider]    Family History Family History  Problem Relation Age of Onset  . Stroke Mother   . Heart disease Maternal Grandmother   . Colon cancer Neg Hx   . Esophageal cancer Neg Hx   . Pancreatic cancer Neg Hx   . Stomach cancer Neg Hx   . Liver disease Neg Hx     Social History Social History   Tobacco Use  . Smoking status: Current Every Day Smoker  . Smokeless tobacco: Never Used  Substance Use Topics  . Alcohol use: Yes    Alcohol/week: 0.0 oz    Comment: 2 a day  . Drug use: No    Comment: JANUARY 2017     Allergies   Patient has no known allergies.   Review of Systems Review of Systems  Unable to perform ROS: Patient unresponsive  Constitutional: Negative for fever.  Neurological: Positive for seizures. Negative for  headaches.  level 5 caveat - pt unresponsive to majority of questions asked.     Physical Exam Updated Vital Signs There were no vitals taken for this visit.  Physical Exam  Constitutional: She appears well-developed and well-nourished. No distress.  HENT:  Head: Atraumatic.  Mouth/Throat: Oropharynx is clear and moist.  Eyes: Conjunctivae and EOM are normal. Pupils are equal, round, and reactive to light. No scleral icterus.  Neck: Normal range of motion. Neck supple. No tracheal deviation present.  No stiffness or rigidity. No bruits.   Cardiovascular: Normal rate, regular rhythm, normal heart sounds and intact distal pulses. Exam reveals no gallop and no friction rub.  No murmur heard. Pulmonary/Chest: Effort normal and breath sounds normal. No respiratory distress.  Abdominal: Soft. Normal appearance and bowel sounds are normal. She exhibits no distension. There is no tenderness.  Genitourinary:  Genitourinary Comments: No cva tenderness  Musculoskeletal: She exhibits no edema.  Neurological: She is alert. No cranial nerve deficit.  Alert appearing. Looks all about room. Pt answers an occasional question but then will not answer majority of questions asked. Pt moves bil extremities purposefully w good strength. Pt w bil legs crossed in comfortable positioning.  Skin: Skin is warm and dry. No rash noted. She is not diaphoretic.  Psychiatric:  Unusual affect. Anxious appearing.   Nursing note and vitals reviewed.    ED Treatments / Results  Labs (all labs ordered are listed, but only abnormal results are displayed) Results for orders placed or performed during the hospital encounter of 12/11/16  Comprehensive metabolic panel  Result Value Ref Range   Sodium 142 135 - 145 mmol/L   Potassium 3.8 3.5 - 5.1 mmol/L   Chloride 108 101 - 111 mmol/L   CO2 25 22 - 32 mmol/L   Glucose, Bld 95 65 - 99 mg/dL   BUN 7 6 - 20 mg/dL   Creatinine, Ser 0.69 0.44 - 1.00 mg/dL   Calcium  8.7 (L) 8.9 - 10.3 mg/dL   Total Protein 7.6 6.5 - 8.1 g/dL   Albumin 3.8 3.5 - 5.0 g/dL   AST 32 15 - 41 U/L   ALT 30 14 - 54 U/L   Alkaline Phosphatase 94 38 - 126 U/L   Total Bilirubin 0.3 0.3 - 1.2 mg/dL   GFR calc non Af Amer >60 >60 mL/min   GFR calc Af Amer >60 >60 mL/min   Anion gap 9 5 - 15  CBC  Result Value Ref Range   WBC 7.2 4.0 - 10.5 K/uL   RBC 4.71 3.87 - 5.11 MIL/uL   Hemoglobin 14.3 12.0 - 15.0 g/dL   HCT 42.0 36.0 - 46.0 %   MCV 89.2 78.0 - 100.0 fL   MCH 30.4 26.0 - 34.0 pg   MCHC 34.0 30.0 - 36.0 g/dL   RDW 14.0 11.5 - 15.5 %   Platelets 333 150 - 400 K/uL  Urinalysis, Routine w reflex microscopic  Result Value Ref Range   Color, Urine YELLOW YELLOW   APPearance CLEAR CLEAR   Specific Gravity, Urine 1.019 1.005 - 1.030   pH 5.0 5.0 - 8.0   Glucose, UA NEGATIVE NEGATIVE mg/dL   Hgb urine dipstick NEGATIVE NEGATIVE   Bilirubin Urine NEGATIVE NEGATIVE   Ketones, ur NEGATIVE NEGATIVE mg/dL   Protein, ur NEGATIVE NEGATIVE mg/dL   Nitrite NEGATIVE NEGATIVE   Leukocytes, UA NEGATIVE NEGATIVE  CBG monitoring, ED  Result Value Ref Range   Glucose-Capillary 89 65 - 99 mg/dL  CBG monitoring, ED  Result Value Ref Range   Glucose-Capillary 86 65 - 99 mg/dL   Comment 1 Notify RN    Comment 2 Document in Chart    Ct Head Wo Contrast  Result Date: 12/11/2016 CLINICAL DATA:  Altered mental status, seizure like activity. EXAM: CT HEAD WITHOUT CONTRAST TECHNIQUE: Contiguous axial images were obtained from the base of the skull through the vertex without intravenous contrast. COMPARISON:  Head CT dated 08/12/2015. FINDINGS: Brain: Ventricles are stable in size and configuration. Old infarct again noted at the anterior margin of the left basal ganglia, with associated encephalomalacia. There is no mass, hemorrhage, edema or other evidence of acute parenchymal abnormality. No extra-axial hemorrhage. Vascular: There are chronic calcified atherosclerotic changes of the  large vessels at the skull base. No unexpected hyperdense vessel. Skull: Normal. Negative for fracture or focal lesion. Sinuses/Orbits: Unremarkable. Other: None. IMPRESSION: 1. No acute findings.  No intracranial mass, hemorrhage or edema. 2. Stable old infarct within the left basal ganglia. Electronically Signed   By: Franki Cabot M.D.   On: 12/11/2016 14:01    EKG  EKG Interpretation  Date/Time:  Saturday December 11 2016 12:13:12 EST Ventricular Rate:  85 PR Interval:    QRS Duration: 79 QT Interval:  417 QTC Calculation: 496 R Axis:   39 Text Interpretation:  Sinus rhythm Non-specific ST-t changes Confirmed by Lajean Saver [phone]) on 12/11/2016 12:45:56 PM       Radiology No results found.  Procedures Procedures (including critical care time)  Medications Ordered in ED Medications - No data to display   Initial Impression / Assessment and Plan / ED Course  I have reviewed the triage vital signs and the nursing notes.  Pertinent labs & imaging results that were available during my care of the patient were reviewed by me and considered in my medical decision making (see chart for details).  Iv ns. Labs. Continuous pulse ox and monitor.   Called into room re possible seizure.    Pt turning head side to side - appears intentional. Heart rate remains unchanged on monitor. In 80's. w voice command, pt stops moving/turning head.   Reviewed nursing notes and prior charts for additional history.  On review prior charts, pt with prior workup for similar sounding episodes including mri x 2 and eeg x 2.    Head ct neg. Labs c/w baseline.  No seizure activity in ED. Afebrile.\  Pt is eating and drinking.  Alert. At baseline. No focal neuro deficit on exam.   Pt currently appears stable for d/c.     Final Clinical Impressions(s) / ED Diagnoses   Final diagnoses:  None    ED Discharge Orders    None       Lajean Saver, MD 12/11/16 1626

## 2016-12-11 NOTE — ED Notes (Signed)
Pt given [place] sandwich bag and water per [employer] Investment banker, corporate)

## 2016-12-11 NOTE — ED Triage Notes (Signed)
Pt brought in by [facility] for possible seizure-like activity. Per bystander, right sided activity greater than on the left. Per EMS pt is nonverbal. EMS stated that they did not witness any seizures en route. Pt currently nonverbal with hospital staff and unable to follow commands. Per EMS when pts phone rang she immediately picked it up and looked at it, but did not answer.

## 2016-12-11 NOTE — ED Notes (Signed)
Called to room by family where pt is staring off into space. Pt is non responsive to voice, touch , or sternal rub. Eyes remain open. No rise and fall of chest noted. Monitor indicate hr of 88 that rises to 93. Sat drops into 88 range. Pt turns head slowly to left and then begins shaking upper extremities with breathing noted . No incontinence noted. Pt then begins responding to questions with head nodding. Dr. Delane Ginger at bedside. Movement last 30 seconds or less. Husband visibly upset at bedsdie and tearful.

## 2016-12-13 ENCOUNTER — Ambulatory Visit: Payer: Medicaid Other | Admitting: Neurology

## 2016-12-13 ENCOUNTER — Encounter: Payer: Self-pay | Admitting: Neurology

## 2016-12-13 VITALS — BP 118/76 | HR 85 | Ht 64.0 in | Wt 257.4 lb

## 2016-12-13 DIAGNOSIS — R569 Unspecified convulsions: Secondary | ICD-10-CM | POA: Diagnosis not present

## 2016-12-13 DIAGNOSIS — I69359 Hemiplegia and hemiparesis following cerebral infarction affecting unspecified side: Secondary | ICD-10-CM | POA: Diagnosis not present

## 2016-12-13 DIAGNOSIS — Z789 Other specified health status: Secondary | ICD-10-CM | POA: Diagnosis not present

## 2016-12-13 DIAGNOSIS — Z7289 Other problems related to lifestyle: Secondary | ICD-10-CM

## 2016-12-13 DIAGNOSIS — Z720 Tobacco use: Secondary | ICD-10-CM | POA: Diagnosis not present

## 2016-12-13 DIAGNOSIS — F339 Major depressive disorder, recurrent, unspecified: Secondary | ICD-10-CM | POA: Diagnosis not present

## 2016-12-13 NOTE — Patient Instructions (Addendum)
My suspicion is that these aren't epileptic seizures but rather related to depression and anxiety.  1.  We will refer you to psychiatry 2.  We will also see if we can refer you to the Epilepsy Monitoring Unit at [facility] for hospital admission in order to evaluate these seizure like events. 3.  Continue to try and quit smoking and alcohol use 4.  Follow up in 4 months

## 2016-12-13 NOTE — Progress Notes (Signed)
[place], was given [employer]. Left info of Pt info on the message, faxed office note, demo, copy of card to [phone]
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
## 2017-02-16 DIAGNOSIS — F1491 Cocaine use, unspecified, in remission: Secondary | ICD-10-CM | POA: Insufficient documentation

## 2017-02-16 DIAGNOSIS — F1011 Alcohol abuse, in remission: Secondary | ICD-10-CM

## 2017-02-16 DIAGNOSIS — F329 Major depressive disorder, single episode, unspecified: Secondary | ICD-10-CM

## 2017-02-16 DIAGNOSIS — Z8619 Personal history of other infectious and parasitic diseases: Secondary | ICD-10-CM

## 2017-02-16 HISTORY — DX: Major depressive disorder, single episode, unspecified: F32.9

## 2017-02-16 HISTORY — DX: Alcohol abuse, in remission: F10.11

## 2017-02-16 HISTORY — DX: Personal history of other infectious and parasitic diseases: Z86.19

## 2017-02-17 DIAGNOSIS — F445 Conversion disorder with seizures or convulsions: Secondary | ICD-10-CM | POA: Insufficient documentation

## 2017-02-17 HISTORY — DX: Conversion disorder with seizures or convulsions: F44.5

## 2017-03-26 IMAGING — CT CT CERVICAL SPINE W/O CM
5 of 6 series · 14 of 33 positions shown, 16 images · non-contrast
Comparison: None.

CLINICAL DATA: Seizure with syncopal episode.  Hypertension.

EXAM:
CT HEAD WITHOUT CONTRAST
CT CERVICAL SPINE WITHOUT CONTRAST
TECHNIQUE: Multidetector CT imaging of the head and cervical spine was
performed following the standard protocol without intravenous
contrast. Multiplanar CT image reconstructions of the cervical spine
were also generated.

[Series 3: head bone · axial · 0.47mm/px · z∈[-173,-111]mm · 2 of 93 slices shown]
[im 31/93  bone]
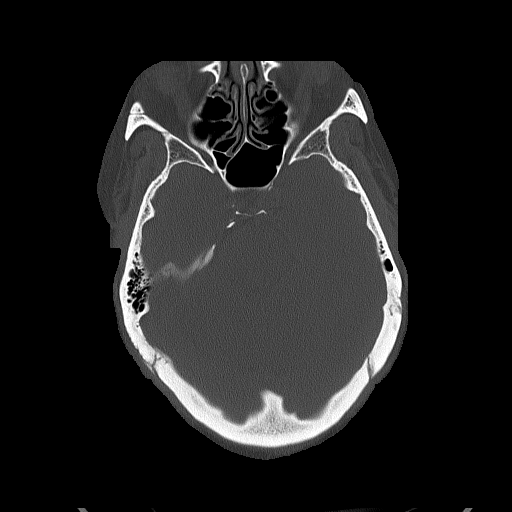
[im 62/93  bone]
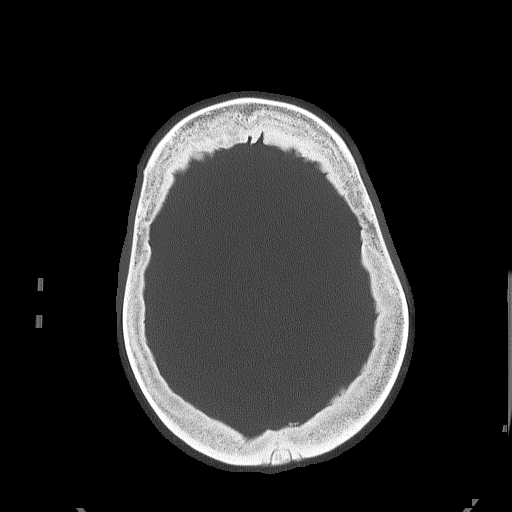

[Series 6: c_spine 2.0 st · axial · 0.46mm/px · z∈[-344,-278]mm · 2 of 99 slices shown, 3 images]
[im 33/99  soft-tissue]
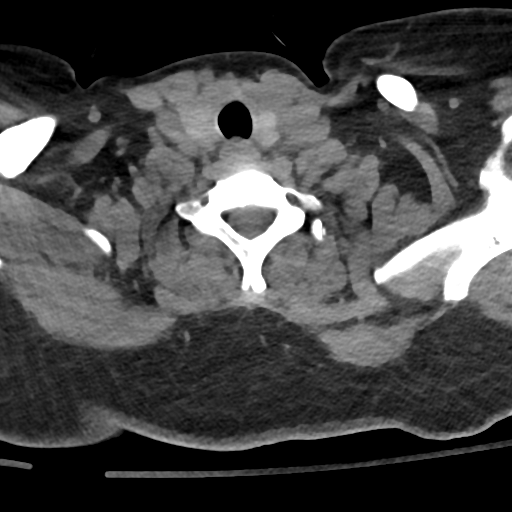
[im 33/99  bone]
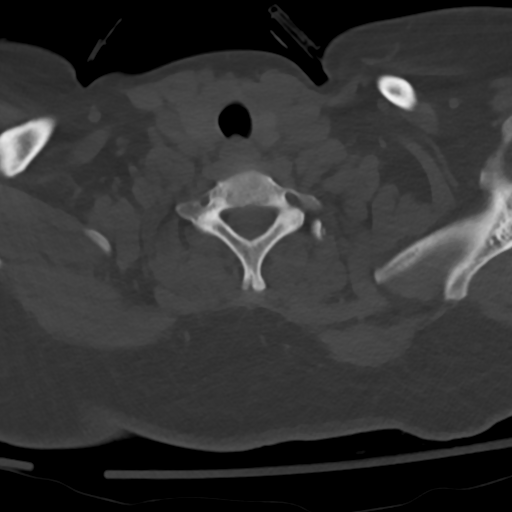
[im 66/99  bone]
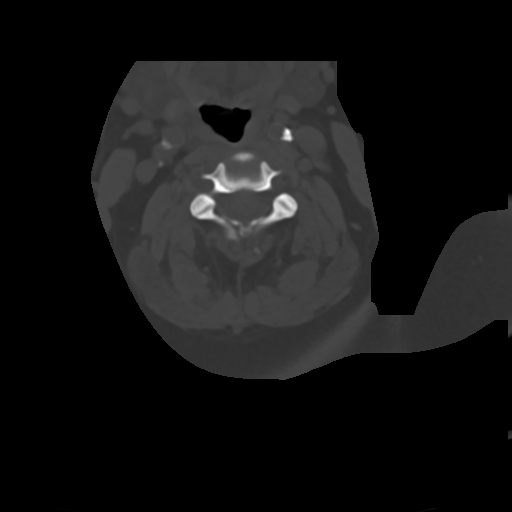

[Series 8: c_spine 2.0 sag bone · sagittal · 0.34mm/px · 5 of 61 slices shown, 6 images]
[im 21/61  bone]
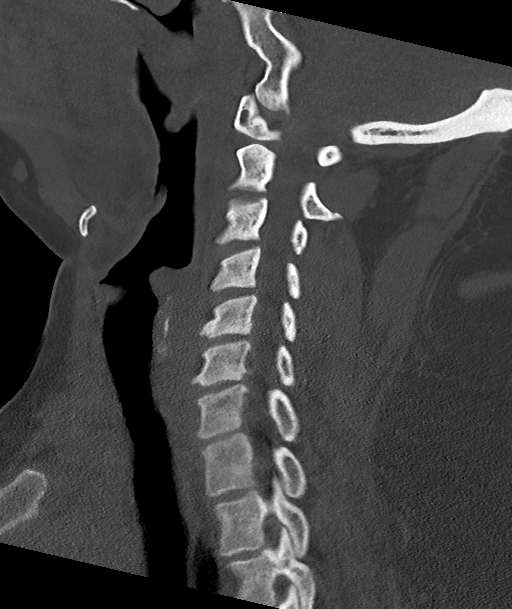
[im 26/61  bone]
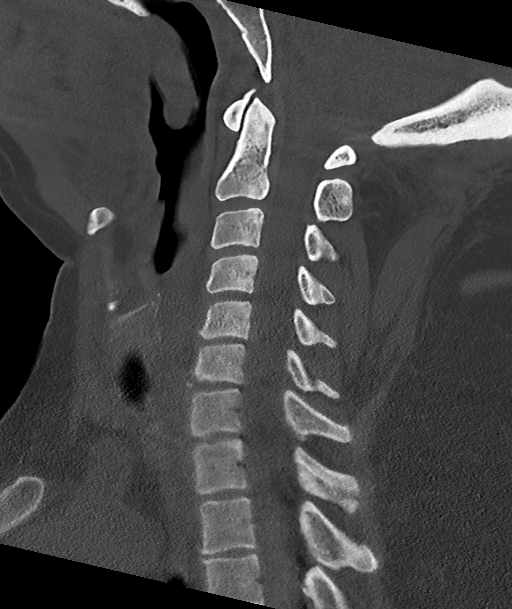
[im 31/61  soft-tissue]
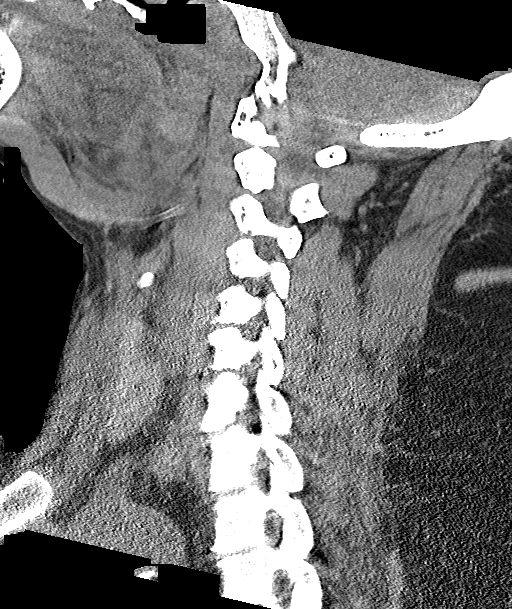
[im 31/61  bone]
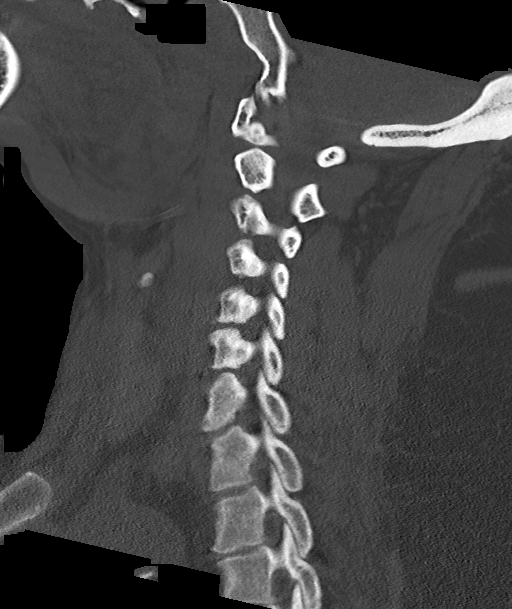
[im 36/61  bone]
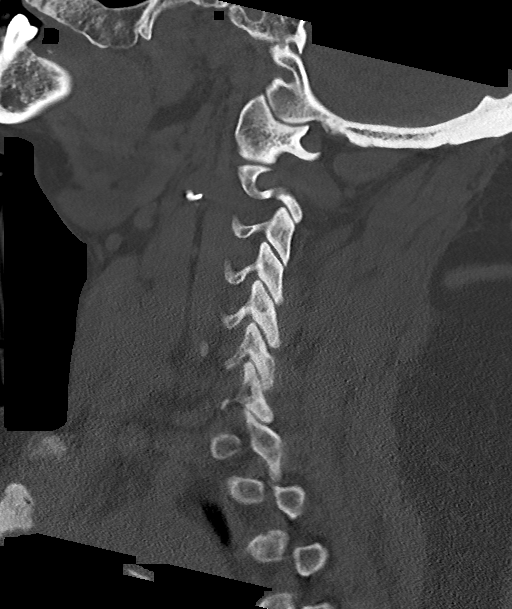
[im 41/61  bone]
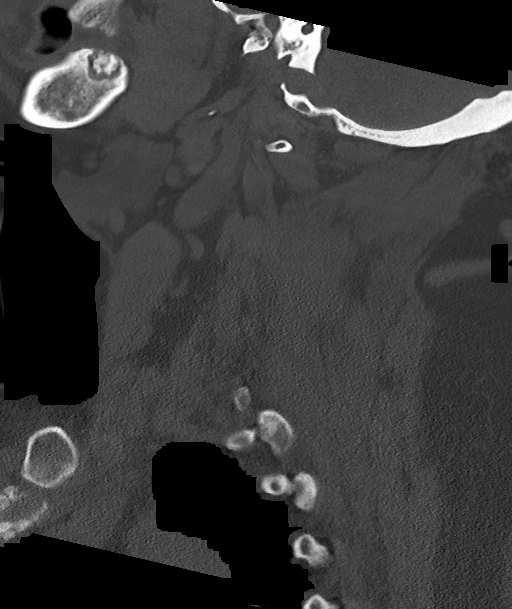

[Series 9: c_spine 2.0 cor bone · coronal · 0.29mm/px · 3 of 61 slices shown]
[im 13/61  bone]
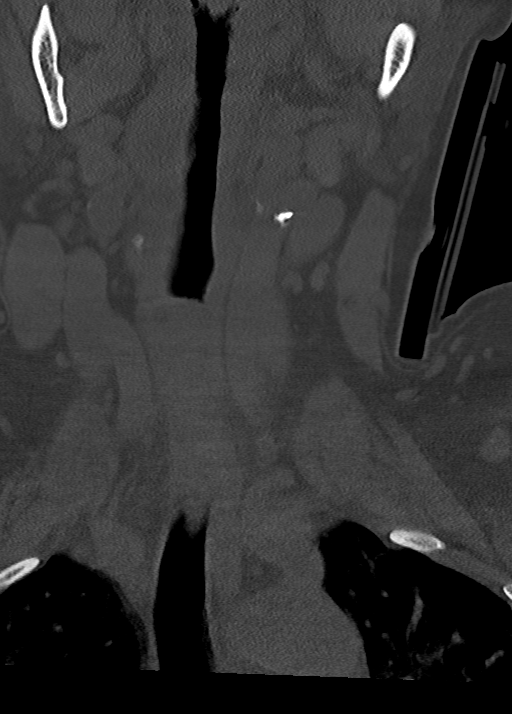
[im 25/61  bone]
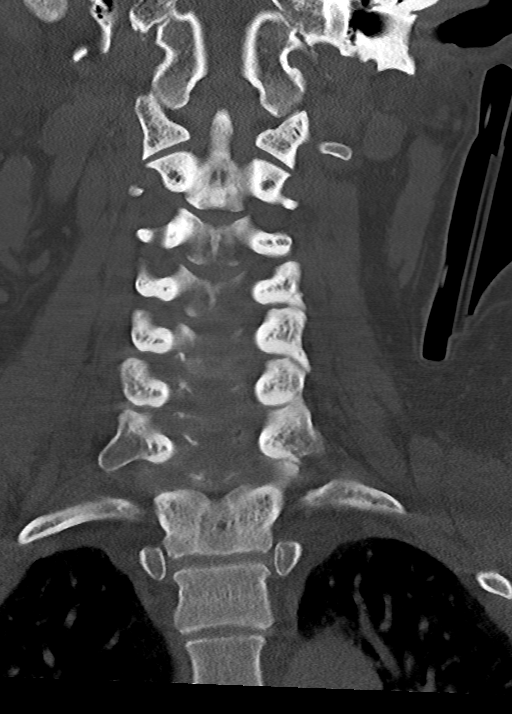
[im 37/61  bone]
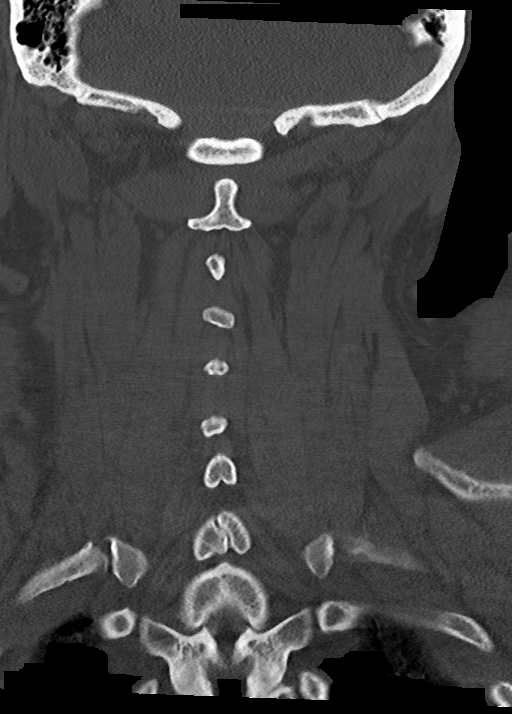

[Series 10: c_spine 2.0 orthogonals · axial · 0.21mm/px · z∈[-363,-293]mm · 2 of 96 slices shown]
[im 32/96  bone]
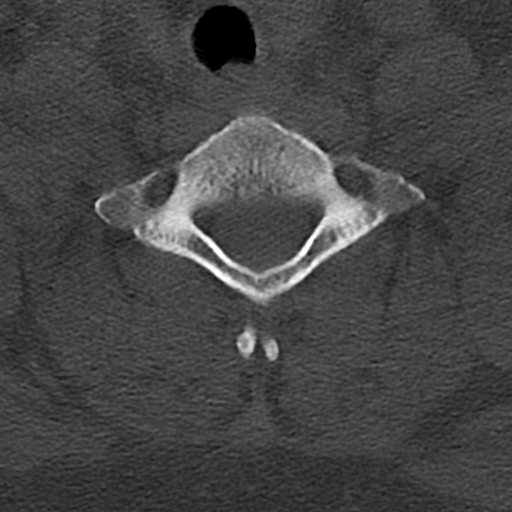
[im 64/96  bone]
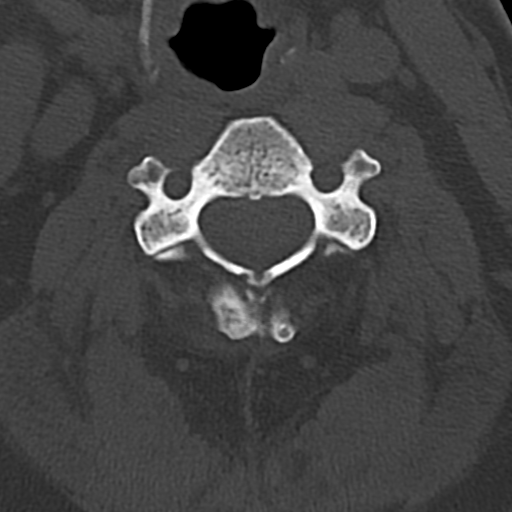

[14 of 33 positions shown; findings below may reference images not displayed]

FINDINGS: CT HEAD FINDINGS

The ventricles are normal in size and configuration. There is mild
invagination of CSF into the sella. There is no intracranial mass,
hemorrhage, extra-axial fluid collection, or midline shift. There is
a focal infarct involving the anterior left basal ganglia with
involvement of a portion of the head of the caudate on the left, a
portion of the anterior limb of the left internal capsule, and a
portion of the anterior aspect of the left lentiform nucleus. This
focal area [DATE] represent a recent/acute infarct. Elsewhere,
gray-white compartments are normal. The bony calvarium appears
intact. The mastoid air cells are clear. Visualized orbital regions
appear symmetric and unremarkable.

CT CERVICAL SPINE FINDINGS

There is no fracture or spondylolisthesis. Prevertebral soft tissues
and predental space regions are normal. There is mild disc space
narrowing at C5-6, C6-7, and C7-T1. There is no nerve root edema or
effacement. No disc extrusion or stenosis. There is calcification in
each carotid artery.
IMPRESSION: CT head: Decreased attenuation in the anterior left basal ganglia, a
finding suspicious for recent/acute infarct in this area. Elsewhere
gray-white compartments appear normal. No hemorrhage or well-defined
mass. Mild degree of empty sella.

CT cervical spine: Mild osteoarthritic change. No fracture or
spondylolisthesis. Foci of carotid artery calcification on both
sides.

## 2017-04-05 ENCOUNTER — Telehealth: Payer: Self-pay | Admitting: Neurology

## 2017-04-05 NOTE — Telephone Encounter (Signed)
Patient's husbands name is [name]

## 2017-04-05 NOTE — Telephone Encounter (Signed)
Patient called and said that transportation is needing a note faxed to them that her husband needs to come with her to her next appointment. She needs him to help explain what is said at the appointment. The fax # is [phone]. Thanks

## 2017-04-05 NOTE — Telephone Encounter (Signed)
Letter created and sent to transportation

## 2017-04-12 ENCOUNTER — Encounter: Payer: Self-pay | Admitting: Neurology

## 2017-04-12 ENCOUNTER — Ambulatory Visit: Payer: Medicaid Other | Admitting: Neurology

## 2017-04-12 ENCOUNTER — Telehealth: Payer: Self-pay

## 2017-04-12 VITALS — BP 136/72 | HR 90 | Resp 16 | Ht 65.0 in | Wt 283.4 lb

## 2017-04-12 DIAGNOSIS — I69359 Hemiplegia and hemiparesis following cerebral infarction affecting unspecified side: Secondary | ICD-10-CM

## 2017-04-12 DIAGNOSIS — F445 Conversion disorder with seizures or convulsions: Secondary | ICD-10-CM

## 2017-04-12 DIAGNOSIS — F329 Major depressive disorder, single episode, unspecified: Secondary | ICD-10-CM

## 2017-04-12 DIAGNOSIS — F32A Depression, unspecified: Secondary | ICD-10-CM

## 2017-04-12 NOTE — Addendum Note (Signed)
Addended by: Valere Dross on: 04/12/2017 01:23 PM   Modules accepted: Orders

## 2017-04-12 NOTE — Progress Notes (Signed)
ambul
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
## 2017-04-12 NOTE — Telephone Encounter (Signed)
-----   Message from Pieter Partridge, DO sent at 04/12/2017 12:30 PM EDT ----- I would like to refer patient to psychiatrist Dr. Lulu Riding.  I think that she should ask him for referral to a therapist that he recommends.

## 2017-04-12 NOTE — Patient Instructions (Signed)
Your episodes are not epilepsy, they are non-epileptic seizures.  Treatment is with a psychiatrist for medication adjustments and a psychotherapist for talk therapy.  We will let you know if there is anyone specific we recommend.   Non-Epileptic Seizures, Adult A seizure can cause:  Involuntary movements, like falling or shaking.  Changes in awareness or consciousness.  Convulsions. These are episodes of uncontrollable, jerking movement caused by sudden, intense tightening (contraction) of the muscles.  Epileptic seizures are caused by abnormal electrical activity in the brain. Non-epileptic seizures are different. They are not caused by abnormal electrical signals in your brain. These seizures may look like epileptic seizures, but they are not caused by epilepsy. There are two types of non-epileptic seizures:  Physiologic non-epileptic seizure. This type results from an underlying problem that causes a disruption in your brain's electrical activity.  Psychogenic non-epileptic seizure. This type results from emotional stress. These seizures are sometimes called pseudoseizures.  What are the causes? Causes of physiologic non-epileptic seizures can include:  Sudden drop in blood pressure.  Low blood sugar (glucose).  Low levels of salt (sodium) in your blood.  Low levels of calcium in your blood.  Migraine.  Heart rhythm problems.  Sleep disorders, such as narcolepsy.  Movement disorders, such as Tourette syndrome.  Infection.  Certain medicines.  Drug and alcohol abuse.  Fever.  Common causes of psychogenic non-epileptic seizures can include:  Stress.  Emotional trauma.  Sexual or physical abuse.  Major life events, such as divorce or death of a loved one.  Mental health disorders, including anxiety and depression.  What are the signs or symptoms? Symptoms of a non-epileptic seizure can be similar to those of an epileptic seizure, which may include:  A  change in attention or behavior (altered mental status).  Loss of consciousness or fainting.  Convulsions with rhythmic jerking movements.  Drooling.  Rapid eye movements.  Grunting.  Loss of bladder control and bowel control.  Bitter taste in the mouth.  Tongue biting.  Some people experience unusual sensations (aura) before having a seizure. These can include:  "Butterflies" in the stomach.  Abnormal smells or tastes.  A feeling of having had a new experience before (dj vu).  After a non-epileptic seizure, you may have a headache or sore muscles or feel confused and sleepy. Non-epileptic seizures usually:  Do not cause physical injuries.  Start slowly.  Include crying or shrieking.  Last longer than 2 minutes.  Include pelvic thrusting.  How is this diagnosed? Non-epileptic seizures may be diagnosed by:  Your medical history.  A physical exam.  Your symptoms. ? Your health care provider may want to talk with your friends or relatives who have seen you have a seizure. ? If possible, it is helpful if you write down your seizure activity, including what led up to the seizure, and share that information with your health care provider.  You may also need to have tests to look for causes of physiologic non-epileptic seizures. These may include:  An electroencephalogram (EEG). This test measures electrical activity in your brain. If you have had a non-epileptic seizure, the results of your EEG will likely be normal.  Video EEG. This test takes place in the hospital over the course of 2-7 days. The test uses a video camera and an EEG to monitor your symptoms and the electrical activity in your brain.  Blood tests.  Lumbar puncture. This test involves pulling fluid from your spine to check for infection.  Electrocardiogram (  ECG or EKG). This test checks for an abnormal heart rhythm.  CT scan.  If your health care provider thinks you have had a psychogenic  non-epileptic seizure, you may need to see a mental health specialist for an evaluation. How is this treated? The treatment for your seizures will depend on what is causing them. When the underlying condition is treated, your seizures should stop. If your seizures are being caused by emotional trauma or stress, your health care provider may recommend that you see a mental health professional. Treatment may include:  Relaxation therapy or cognitive behavioral therapy.  Medicines to treat depression or anxiety.  Individual or family counseling.  In some cases, you may have psychogenic seizures in addition to epileptic seizures. If this is the case, you may be prescribed medicine to help with the epileptic seizures. Follow these instructions at home: Home care will depend on the type of non-epileptic seizures that you have. In general:  Follow all instructions from your health care provider. These may include ways to prevent seizures and what to do if you have a seizure.  Take over-the-counter and prescription medicines only as told by your health care provider.  Keep all follow-up visits as told by your health care provider. This is important.  Make sure family members, friends, and coworkers are trained on how to help you if you have a seizure. If you have a seizure, they should: ? Lay you on the ground to prevent a fall. ? Place a pillow or piece of clothing under your head. ? Loosen any clothing around your neck. ? Turn you onto your side. If vomiting occurs, this helps keep your airway clear.  Avoid any substances that may prevent your medicine from working properly. If you are prescribed medicine for seizures: ? Do not use recreational drugs. ? Limit or avoid alcoholic beverages.  Contact a health care provider if:  Your seizures change or become more frequent.  You continue to have seizures after treatment. Get help right away if:  You injure yourself during a  seizure.  You have one seizure after another.  You have trouble recovering from a seizure.  You have chest pain or trouble breathing.  You have a seizure that lasts longer than 5 minutes. Summary  Non-epileptic seizures may look like epileptic seizures, but they are not caused by epilepsy.  The treatment for your seizures will depend on what is causing them. When the underlying condition is treated, your seizures should stop.  Make sure family members, friends, and coworkers are trained on how to help you if you have a seizure. If you have a seizure, they should lay you on the ground to prevent a fall, protect your head and neck, and turn you onto your side. This information is not intended to replace advice given to you by your health care provider. Make sure you discuss any questions you have with your health care provider. Document Released: 02/26/2005 Document Revised: 10/24/2015 Document Reviewed: 10/24/2015 Elsevier Interactive Patient Education  [employer].

## 2017-04-12 NOTE — Telephone Encounter (Signed)
Referral made for Dr. Lulu Riding.

## 2017-05-16 ENCOUNTER — Telehealth: Payer: Self-pay | Admitting: Neurology

## 2017-05-16 NOTE — Telephone Encounter (Signed)
Called and spoke with Pt. She has not been contacted from psychiatry. Called [facility], spoke with Ava, advsd her referral was placed 04/12/17, Pt not contacted. She will reach out to Pt now to schedule.

## 2017-05-16 NOTE — Telephone Encounter (Signed)
Patient called and would like to speak with you regarding a referral that was for [facility]. Please Call. Thanks

## 2017-05-16 NOTE — Telephone Encounter (Signed)
Called Pt to ensure she was contacted by [facility]. She has an appt in August.

## 2017-05-19 ENCOUNTER — Emergency Department (HOSPITAL_COMMUNITY)
Admission: EM | Admit: 2017-05-19 | Discharge: 2017-05-19 | Disposition: A | Discharge status: Home / Self Care | Payer: Medicaid Other | Attending: Physician Assistant | Admitting: Physician Assistant

## 2017-05-19 ENCOUNTER — Emergency Department (HOSPITAL_COMMUNITY): Payer: Medicaid Other

## 2017-05-19 DIAGNOSIS — I10 Essential (primary) hypertension: Secondary | ICD-10-CM | POA: Diagnosis not present

## 2017-05-19 DIAGNOSIS — R569 Unspecified convulsions: Secondary | ICD-10-CM | POA: Diagnosis not present

## 2017-05-19 DIAGNOSIS — Z7982 Long term (current) use of aspirin: Secondary | ICD-10-CM | POA: Diagnosis not present

## 2017-05-19 DIAGNOSIS — F17200 Nicotine dependence, unspecified, uncomplicated: Secondary | ICD-10-CM | POA: Diagnosis not present

## 2017-05-19 DIAGNOSIS — R531 Weakness: Secondary | ICD-10-CM | POA: Diagnosis not present

## 2017-05-19 DIAGNOSIS — Z79899 Other long term (current) drug therapy: Secondary | ICD-10-CM | POA: Diagnosis not present

## 2017-05-19 LAB — CBC
HEMATOCRIT: 41.5 % (ref 36.0–46.0)
Hemoglobin: 13.9 g/dL (ref 12.0–15.0)
MCH: 29.4 pg (ref 26.0–34.0)
MCHC: 33.5 g/dL (ref 30.0–36.0)
MCV: 87.9 fL (ref 78.0–100.0)
Platelets: 336 10*3/uL (ref 150–400)
RBC: 4.72 MIL/uL (ref 3.87–5.11)
RDW: 14.1 % (ref 11.5–15.5)
WBC: 9.8 10*3/uL (ref 4.0–10.5)

## 2017-05-19 LAB — I-STAT CHEM 8, ED
BUN: 6 mg/dL (ref 6–20)
CALCIUM ION: 1.15 mmol/L (ref 1.15–1.40)
CHLORIDE: 102 mmol/L (ref 101–111)
Creatinine, Ser: 0.7 mg/dL (ref 0.44–1.00)
GLUCOSE: 94 mg/dL (ref 65–99)
HCT: 44 % (ref 36.0–46.0)
HEMOGLOBIN: 15 g/dL (ref 12.0–15.0)
Potassium: 3.9 mmol/L (ref 3.5–5.1)
Sodium: 138 mmol/L (ref 135–145)
TCO2: 27 mmol/L (ref 22–32)

## 2017-05-19 LAB — URINALYSIS, ROUTINE W REFLEX MICROSCOPIC
Bilirubin Urine: NEGATIVE
Glucose, UA: NEGATIVE mg/dL
HGB URINE DIPSTICK: NEGATIVE
Ketones, ur: NEGATIVE mg/dL
LEUKOCYTES UA: NEGATIVE
NITRITE: NEGATIVE
PROTEIN: NEGATIVE mg/dL
SPECIFIC GRAVITY, URINE: 1.006 (ref 1.005–1.030)
pH: 7 (ref 5.0–8.0)

## 2017-05-19 LAB — COMPREHENSIVE METABOLIC PANEL
ALK PHOS: 90 U/L (ref 38–126)
ALT: 17 U/L (ref 14–54)
AST: 18 U/L (ref 15–41)
Albumin: 4 g/dL (ref 3.5–5.0)
Anion gap: 11 (ref 5–15)
BILIRUBIN TOTAL: 0.3 mg/dL (ref 0.3–1.2)
BUN: 6 mg/dL (ref 6–20)
CALCIUM: 9.6 mg/dL (ref 8.9–10.3)
CHLORIDE: 104 mmol/L (ref 101–111)
CO2: 22 mmol/L (ref 22–32)
CREATININE: 0.75 mg/dL (ref 0.44–1.00)
Glucose, Bld: 98 mg/dL (ref 65–99)
Potassium: 4.1 mmol/L (ref 3.5–5.1)
Sodium: 137 mmol/L (ref 135–145)
TOTAL PROTEIN: 7.5 g/dL (ref 6.5–8.1)

## 2017-05-19 LAB — DIFFERENTIAL
BASOS PCT: 0 %
Basophils Absolute: 0 10*3/uL (ref 0.0–0.1)
Eosinophils Absolute: 0.1 10*3/uL (ref 0.0–0.7)
Eosinophils Relative: 1 %
LYMPHS ABS: 2.8 10*3/uL (ref 0.7–4.0)
Lymphocytes Relative: 29 %
MONOS PCT: 8 %
Monocytes Absolute: 0.8 10*3/uL (ref 0.1–1.0)
NEUTROS ABS: 6.1 10*3/uL (ref 1.7–7.7)
Neutrophils Relative %: 62 %

## 2017-05-19 LAB — RAPID URINE DRUG SCREEN, HOSP PERFORMED
Amphetamines: NOT DETECTED
BARBITURATES: NOT DETECTED
Benzodiazepines: NOT DETECTED
Cocaine: NOT DETECTED
Opiates: NOT DETECTED
Tetrahydrocannabinol: NOT DETECTED

## 2017-05-19 LAB — ETHANOL

## 2017-05-19 LAB — I-STAT BETA HCG BLOOD, ED (MC, WL, AP ONLY)

## 2017-05-19 LAB — I-STAT TROPONIN, ED: TROPONIN I, POC: 0 ng/mL (ref 0.00–0.08)

## 2017-05-19 LAB — APTT: aPTT: 28 seconds (ref 24–36)

## 2017-05-19 LAB — PROTIME-INR
INR: 0.95
Prothrombin Time: 12.6 seconds (ref 11.4–15.2)

## 2017-05-19 NOTE — ED Notes (Signed)
Dr. Lorraine Lax w/ Neuro paged to Dr. Thomasene Lot @ [phone].

## 2017-05-19 NOTE — Consult Note (Signed)
Requesting Physician: Dr. Thomasene Lot    Chief Complaint: R side weakness, aphasia  History obtained from: Patient and Chart     HPI:                                                                                                                                       Denise [name] is an 47 y.o. female with PMH of R BG hemorrhagic CVA, PNES, multiple ER visits for r side weakness, syncope presents with sudden onset R side weakness and was non verbal. EMS stroke alerted patient and felt she had right gaze preference.   She appears to follow some commands intermittently. Stat CT head was negative, stat MRI did not show an acute stroke.  Stroke code cancelled. Patient returned to baseline shortly after.   It appears these are prior episodes with shaking spells, staring off and passing out. 72 hr ambulatory EEG was negative with no spells captured. She then  had a Epilepsy monitoring unit admission which apparently captured 4 spells and was diagnosed to have non epileptic spells ( psychogenic). It is unclear if spells were exactly similar to this presentation.     Date last known well: 4.25.19 Time last known well:  9.38 am tPA Given: no, low suspicion for stroke ( later confirmed by stat Mri brain)  NIHSS: 19 Baseline MRS 3    Past Medical History:  Diagnosis Date  . Abnormal EKG   . AD (atopic dermatitis)   . Alcohol use 04/14/2015  . Anxiety   . Blood transfusion without reported diagnosis   . Depression   . Fibroid uterus 12/18/2014  . Hyperlipemia   . Hypertension   . Memory changes   . Overactive bladder   . Pelvic pain in female 12/18/2014  . Prediabetes   . Right sided weakness   . Stroke ([place])   . Substance abuse ([place])   . Syncope 02/07/2015  . TIA (transient ischemic attack)   . Tobacco use 04/14/2015    Past Surgical History:  Procedure Laterality Date  . CESAREAN SECTION     x 2    Family History  Problem Relation Age of Onset  . Stroke Mother   .  Heart disease Maternal Grandmother   . Colon cancer Neg Hx   . Esophageal cancer Neg Hx   . Pancreatic cancer Neg Hx   . Stomach cancer Neg Hx   . Liver disease Neg Hx    Social History:  reports that she has been smoking.  She has never used smokeless tobacco. She reports that she drinks alcohol. She reports that she does not use drugs.  Allergies: No Known Allergies  Medications:  I reviewed home medications   ROS:                                                                                                                                     Unable to obtain due to AMS   Examination:                                                                                                      General: Appears well-developed and well-nourished.  Psych: Affect appropriate to situation Eyes: No scleral injection HENT: No OP obstrucion Head: Normocephalic.  Cardiovascular: Normal rate and regular rhythm.  Respiratory: Effort normal and breath sounds normal to anterior ascultation GI: Soft.  No distension. There is no tenderness.  Skin: WDI   Neurological Examination Mental Status: Alert, oriented, thought content appropriate.  Speech fluent without evidence of aphasia. Able to follow 3 step commands without difficulty. Cranial Nerves: II: Visual fields grossly normal,  III,IV, VI: ptosis not present, extra-ocular motions intact bilaterally, pupils equal, round, reactive to light and accommodation V,VII: smile symmetric, facial light touch sensation normal bilaterally VIII: hearing normal bilaterally IX,X: uvula rises symmetrically XI: bilateral shoulder shrug XII: midline tongue extension Motor: Right : Upper extremity   5/5    Left:     Upper extremity   5/5  Lower extremity   5/5     Lower extremity   5/5 Tone and bulk:normal tone throughout; no atrophy  noted Sensory: Pinprick and light touch intact throughout, bilaterally Deep Tendon Reflexes: 2+ and symmetric throughout Plantars: Right: downgoing   Left: downgoing Cerebellar: normal finger-to-nose, normal rapid alternating movements and normal heel-to-shin test Gait: normal gait and station     Lab Results: Basic Metabolic Panel: Recent Labs  Lab 05/19/17 1036 05/19/17 1039  NA 137 138  K 4.1 3.9  CL 104 102  CO2 22  --   GLUCOSE 98 94  BUN 6 6  CREATININE 0.75 0.70  CALCIUM 9.6  --     CBC: Recent Labs  Lab 05/19/17 1036 05/19/17 1039  WBC 9.8  --   NEUTROABS 6.1  --   HGB 13.9 15.0  HCT 41.5 44.0  MCV 87.9  --   PLT 336  --     Coagulation Studies: Recent Labs    05/19/17 1036  LABPROT 12.6  INR 0.95    Imaging: Ct Head Code Stroke Wo Contrast  Result Date: 05/19/2017 CLINICAL DATA:  Code stroke. Focal neuro deficit. Sudden onset of right-sided  weakness. EXAM: CT HEAD WITHOUT CONTRAST TECHNIQUE: Contiguous axial images were obtained from the base of the skull through the vertex without intravenous contrast. COMPARISON:  12/11/2016 FINDINGS: Brain: No evidence of acute infarction, hemorrhage, hydrocephalus, extra-axial collection or mass lesion/mass effect. Remote perforator infarct affecting the left caudate head and anterior putamen. Vascular: No hyperdense vessel or unexpected calcification. Skull: Normal. Negative for fracture or focal lesion. Sinuses/Orbits: No acute finding. Other: These results were communicated to Dr. Lorraine Lax at 10:52 amon 4/25/2019by text page via the [facility] messaging system. ASPECTS [facility] Stroke Program Early CT Score) Not scored in the setting of remote infarct in the left MCA distribution. IMPRESSION: 1. No acute finding.  No hemorrhage or visible acute infarct. 2. Remote left basal ganglia infarct. Electronically Signed   By: Monte Fantasia M.D.   On: 05/19/2017 10:53     ASSESSMENT AND PLAN  47 y.o. female with PMH of R BG  hemorrhagic CVA presents with sudden onset R side weakness and was non verbal. Patient improved rapidly and returned to baseline.   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

## 2017-05-19 NOTE — Discharge Instructions (Addendum)
You were seen today with some confusion.  It looks that you had a non-epileptiform seizure.  Please follow-up with your neurologist.  Please return with any concerns.

## 2017-05-19 NOTE — ED Provider Notes (Signed)
[place] EMERGENCY DEPARTMENT Provider Note   CSN: [id number] Arrival date & time: 05/19/17  1032   An emergency department physician performed an initial assessment on this suspected stroke patient at 1035(Raiyah Speakman).  History   Chief Complaint Chief Complaint  Patient presents with  . Code Stroke    HPI Zanaiya Calabria is a 47 y.o. female.  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  She is presenting today with a "spell".  Initially called a code stroke.  Got a CT and MRI.  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  Past Medical History:  Diagnosis Date  . Abnormal EKG   . AD (atopic dermatitis)   . Alcohol use 04/14/2015  . Anxiety   . Blood transfusion without reported diagnosis   . Depression   . Fibroid uterus 12/18/2014  . Hyperlipemia   . Hypertension   . Memory changes   . Overactive bladder   . Pelvic pain in female 12/18/2014  . Prediabetes   . Right sided weakness   . Stroke ([place])   . Substance abuse ([place])   . Syncope 02/07/2015  . TIA (transient ischemic attack)   . Tobacco use 04/14/2015    Patient Active Problem List   Diagnosis Date Noted  . Tobacco use 04/14/2015  . Alcohol use 04/14/2015  . Right sided weakness   . Syncope 02/07/2015  . Pelvic pain in female 12/18/2014  . Fibroid uterus 12/18/2014  . Hypertension 12/18/2014    Past Surgical History:  Procedure Laterality Date  . CESAREAN SECTION     x 2     OB History    Gravida  3   Para  3   Term      Preterm      AB      Living  3     SAB      TAB      Ectopic      Multiple      Live Births                Home Medications    Prior to Admission medications   Medication Sig Start Date End Date Taking? Authorizing Provider  acetaminophen (TYLENOL) 500 MG tablet Take by mouth.    [provider]  amLODipine (NORVASC) 10 MG tablet Take 10 mg by mouth daily.    [provider]  aspirin EC 81 MG tablet Take 81 mg by mouth daily.    [provider]  atorvastatin (LIPITOR) 10 MG tablet Take 10 mg by mouth at bedtime. 06/29/15   [provider]  cetirizine (ZYRTEC) 10 MG tablet Take 10 mg by mouth daily.    [provider]  FLUoxetine (PROZAC) 20 MG capsule Take 20 mg by mouth daily. 06/30/16   [provider]  ibuprofen (ADVIL,MOTRIN) 200 MG tablet Take 400 mg by mouth every 6 (six) hours as needed for moderate pain.    [provider]  lisinopril (PRINIVIL,ZESTRIL) 20 MG tablet Take 20 mg by mouth 2 (two) times daily.    [provider]  oxybutynin (DITROPAN-XL) 10 MG 24 hr tablet Take by mouth.    [provider]  polyethylene glycol  powder (GLYCOLAX/MIRALAX) powder Mix 17 g with 4-8 ounces of juice and drink once a day 06/26/15   [provider]  prazosin (MINIPRESS) 1 MG capsule Take 1 mg by mouth daily. 07/01/16   [provider]    Family History Family History  Problem Relation Age of Onset  . Stroke Mother   . Heart disease Maternal Grandmother   . Colon cancer Neg Hx   . Esophageal cancer Neg Hx   . Pancreatic cancer Neg Hx   . Stomach cancer Neg Hx   . Liver disease Neg Hx     Social History Social History   Tobacco Use  . Smoking status: Current Every Day Smoker  . Smokeless tobacco: Never Used  Substance Use Topics  . Alcohol use: Yes    Alcohol/week: 0.0 oz    Comment: 2 a day  . Drug use: No    Types: "Crack" cocaine    Comment: JANUARY 2017     Allergies   Patient has no known allergies.   Review of Systems Review of Systems  Unable to perform ROS: Acuity of  condition     Physical Exam Updated Vital Signs Ht 5\' 5"  (1.651 m)   Wt 130.7 kg (288 lb 2.3 oz)   BMI 47.95 kg/m   Physical Exam  Constitutional: She is oriented to person, place, and time. She appears well-developed and well-nourished.  HENT:  Head: Normocephalic and atraumatic.  Eyes: Right eye exhibits no discharge. Left eye exhibits no discharge.  Cardiovascular: Normal rate, regular rhythm and normal heart sounds.  No murmur heard. Pulmonary/Chest: Effort normal and breath sounds normal. She has no wheezes. She has no rales.  Abdominal: Soft. She exhibits no distension. There is no tenderness.  Neurological: She is oriented to person, place, and time.  Weakness on the right.  Patient now alert and oriented x3 talking smiling and interactive.  Skin: Skin is warm and dry. She is not diaphoretic.  Psychiatric: She has a normal mood and affect.  Nursing note and vitals reviewed.    ED Treatments / Results  Labs (all labs ordered are listed, but only abnormal results are displayed) Labs Reviewed  ETHANOL  PROTIME-INR  APTT  CBC  DIFFERENTIAL  COMPREHENSIVE METABOLIC PANEL  RAPID URINE DRUG SCREEN, HOSP PERFORMED  URINALYSIS, ROUTINE W REFLEX MICROSCOPIC  I-STAT CHEM 8, ED  I-STAT TROPONIN, ED  I-STAT BETA HCG BLOOD, ED ([facility], [facility], [facility] ONLY)    EKG None  Radiology Ct Head Code Stroke Wo Contrast  Result Date: 05/19/2017 CLINICAL DATA:  Code stroke. Focal neuro deficit. Sudden onset of right-sided weakness. EXAM: CT HEAD WITHOUT CONTRAST TECHNIQUE: Contiguous axial images were obtained from the base of the skull through the vertex without intravenous contrast. COMPARISON:  12/11/2016 FINDINGS: Brain: No evidence of acute infarction, hemorrhage, hydrocephalus, extra-axial collection or mass lesion/mass effect. Remote perforator infarct affecting the left caudate head and anterior putamen. Vascular: No hyperdense vessel or unexpected calcification. Skull: Normal. Negative  for fracture or focal lesion. Sinuses/Orbits: No acute finding. Other: These results were communicated to Dr. Lorraine Lax at 10:52 amon 4/25/2019by text page via the [facility] messaging system. ASPECTS [facility] Stroke Program Early CT Score) Not scored in the setting of remote infarct in the left MCA distribution. IMPRESSION: 1. No acute finding.  No hemorrhage or visible acute infarct. 2. Remote left basal ganglia infarct. Electronically Signed   By: Monte Fantasia M.D.   On: 05/19/2017 10:53    Procedures Procedures (including critical care  time)  Medications Ordered in ED Medications - No data to display   Initial Impression / Assessment and Plan / ED Course  I have reviewed the triage vital signs and the nursing notes.  Pertinent labs & imaging results that were available during my care of the patient were reviewed by me and considered in my medical decision making (see chart for details).     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  She is presenting today with a "spell".  Initially called a code stroke.  Got a CT and MRI.  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  Husband reports he is ready to get back home to have a beer and watches TV shows.  Would like to discharge at this time.   12:00 PM Will touch base with neurology.  Neurology in agreement.  Will discharge.  Final Clinical Impressions(s) / ED Diagnoses   Final diagnoses:  None    ED Discharge Orders    None       Tacarra Justo, Fredia Sorrow, MD 05/19/17 1257

## 2017-05-19 NOTE — ED Notes (Signed)
Pt verbalized understanding of d/c instructions and has no further questions, VSS, NAD. Taken home by husband

## 2017-05-19 NOTE — ED Notes (Signed)
Dr. Lorraine Lax w/ Neuro paged to Dr. Thomasene Lot @ [phone].

## 2017-05-19 NOTE — ED Triage Notes (Signed)
Pt from PCP with sudden onset of right sided weakness at [phone]

## 2017-05-19 NOTE — Code Documentation (Signed)
47yo female arriving to [facility] via [place] at 1032. Patient from PCP office where she had sudden onset right sided weakness at [phone].  EMS called and activated a code stroke for right gaze and right sided weakness.  Patient noted to have SBP 190 per EMS. Stroke team to the bedside on patient arrival.  Labs drawn and patient cleared for CT by Dr. Thomasene Lot.  Patient to CT with team.  CT completed. Patient with h/o hemorrhagic stroke and seizures. NIHSS 19, see documentation for details and code stroke times.  Patient nonverbal, however, follows intermittent commands on exam. Patient with right hemiplegia and does not cross midline to the left on exam. Order for STAT MRI. Patient transported to MRI with stroke RN and ED RN. Dr. Lorraine Lax made aware that MRI complete. No acute stroke treatment at this time. Code stroke canceled. Bedside handoff with ED RN Rod Holler.

## 2017-05-19 NOTE — ED Notes (Addendum)
Pt requested something to eat. Pt given sandwich and cup of water per RN approval.

## 2017-07-26 ENCOUNTER — Other Ambulatory Visit: Payer: Self-pay

## 2017-07-26 ENCOUNTER — Encounter (HOSPITAL_COMMUNITY): Payer: Self-pay | Admitting: Emergency Medicine

## 2017-07-26 ENCOUNTER — Emergency Department (HOSPITAL_COMMUNITY)
Admission: EM | Admit: 2017-07-26 | Discharge: 2017-07-26 | Disposition: A | Discharge status: Home / Self Care | Payer: Medicaid Other | Attending: Emergency Medicine | Admitting: Emergency Medicine

## 2017-07-26 DIAGNOSIS — Z79899 Other long term (current) drug therapy: Secondary | ICD-10-CM | POA: Insufficient documentation

## 2017-07-26 DIAGNOSIS — Z7984 Long term (current) use of oral hypoglycemic drugs: Secondary | ICD-10-CM | POA: Diagnosis not present

## 2017-07-26 DIAGNOSIS — R531 Weakness: Secondary | ICD-10-CM | POA: Insufficient documentation

## 2017-07-26 DIAGNOSIS — R569 Unspecified convulsions: Secondary | ICD-10-CM | POA: Insufficient documentation

## 2017-07-26 DIAGNOSIS — I10 Essential (primary) hypertension: Secondary | ICD-10-CM | POA: Insufficient documentation

## 2017-07-26 DIAGNOSIS — Z8673 Personal history of transient ischemic attack (TIA), and cerebral infarction without residual deficits: Secondary | ICD-10-CM | POA: Diagnosis not present

## 2017-07-26 DIAGNOSIS — F17200 Nicotine dependence, unspecified, uncomplicated: Secondary | ICD-10-CM | POA: Insufficient documentation

## 2017-07-26 LAB — CBC WITH DIFFERENTIAL/PLATELET
ABS IMMATURE GRANULOCYTES: 0 10*3/uL (ref 0.0–0.1)
Basophils Absolute: 0 10*3/uL (ref 0.0–0.1)
Basophils Relative: 1 %
Eosinophils Absolute: 0.1 10*3/uL (ref 0.0–0.7)
Eosinophils Relative: 1 %
HCT: 43.5 % (ref 36.0–46.0)
HEMOGLOBIN: 13.6 g/dL (ref 12.0–15.0)
IMMATURE GRANULOCYTES: 1 %
LYMPHS PCT: 26 %
Lymphs Abs: 2.1 10*3/uL (ref 0.7–4.0)
MCH: 28 pg (ref 26.0–34.0)
MCHC: 31.3 g/dL (ref 30.0–36.0)
MCV: 89.5 fL (ref 78.0–100.0)
Monocytes Absolute: 0.5 10*3/uL (ref 0.1–1.0)
Monocytes Relative: 6 %
NEUTROS ABS: 5.3 10*3/uL (ref 1.7–7.7)
NEUTROS PCT: 65 %
Platelets: 335 10*3/uL (ref 150–400)
RBC: 4.86 MIL/uL (ref 3.87–5.11)
RDW: 14.1 % (ref 11.5–15.5)
WBC: 8.1 10*3/uL (ref 4.0–10.5)

## 2017-07-26 LAB — I-STAT CHEM 8, ED
BUN: 6 mg/dL (ref 6–20)
Calcium, Ion: 1.15 mmol/L (ref 1.15–1.40)
Chloride: 105 mmol/L (ref 98–111)
Creatinine, Ser: 0.7 mg/dL (ref 0.44–1.00)
Glucose, Bld: 96 mg/dL (ref 70–99)
HCT: 42 % (ref 36.0–46.0)
Hemoglobin: 14.3 g/dL (ref 12.0–15.0)
Potassium: 4 mmol/L (ref 3.5–5.1)
Sodium: 141 mmol/L (ref 135–145)
TCO2: 26 mmol/L (ref 22–32)

## 2017-07-26 MED ORDER — METOCLOPRAMIDE HCL 5 MG/ML IJ SOLN
10.0000 mg | Freq: Once | INTRAMUSCULAR | Status: AC
  Administered 2017-07-26: 10 mg via INTRAVENOUS
  Filled 2017-07-26: qty 2

## 2017-07-26 MED ORDER — DIPHENHYDRAMINE HCL 50 MG/ML IJ SOLN
25.0000 mg | Freq: Once | INTRAMUSCULAR | Status: AC
  Administered 2017-07-26: 25 mg via INTRAVENOUS
  Filled 2017-07-26: qty 1

## 2017-07-26 MED ORDER — SODIUM CHLORIDE 0.9 % IV BOLUS
1000.0000 mL | Freq: Once | INTRAVENOUS | Status: AC
  Administered 2017-07-26: 1000 mL via INTRAVENOUS

## 2017-07-26 NOTE — ED Provider Notes (Signed)
[place] EMERGENCY DEPARTMENT Provider Note   CSN: [id number] Arrival date & time: 07/26/17  1003     History   Chief Complaint Chief Complaint  Patient presents with  . Seizures  . Weakness    HPI Denise [name] is a 47 y.o. female.  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  Patient states she went to the doctor just for medication refill.  When she was there she started having a headache and then per report patient had head bobbing and generalized twitching that lasted less than a minute x2.  Patient currently is awake but states she has weakness on her right arm. Patient's partner is present and he states that this is her typical pattern.  She has seen a neurologist at [facility] who is already done an EEG and is currently working her up.  She has not been started on anti-seizure meds because she has to been seen by psych first which is scheduled in July.  Patient's spouse states that they wanted to make sure that antiseizure medication would be helpful and that she is not just reliving a past trauma.  Patient is taking psychiatric medications and has not changed any of the doses and has been on them for almost a year.  She has not been using drugs or drinking excessive amounts of alcohol.  She is otherwise felt her normal self.  Spouse states that she typically has these episodes multiple times a week sometimes multiple times a day if she becomes hungry gets too hot or overexerts herself.  He states she always has weakness of the right arm afterwards and is usually better after she lays down and rests for a while.  She states symptoms today are no different than the one she has had in the past.  She is complaining of a headache which she often  gets.  The history is provided by the patient and the spouse.  Seizures   This is a recurrent problem. The current episode started 1 to 2 hours ago. The problem has been resolved. There were 2 to 3 seizures. The most recent episode lasted less than 30 seconds. Associated symptoms include sleepiness, headaches and muscle weakness. Pertinent negatives include no cough, no nausea, no vomiting and no diarrhea. Characteristics include rhythmic jerking. head bobbing The episode was witnessed. There was the sensation of an aura present (headache and feeling bad before it started). The seizures did not continue in the ED. The seizure(s) had no focality. Possible causes do not include medication or dosage change, sleep deprivation, recent illness or change in alcohol use. There has been no fever. Associated symptoms comments: Always has right arm weakness and bilateral leg weakness after these events.  Weakness  Associated symptoms include headaches. Pertinent negatives include no vomiting.    Past Medical History:  Diagnosis Date  . Abnormal EKG   . AD (atopic dermatitis)   . Alcohol use 04/14/2015  . Anxiety   . Blood transfusion without reported diagnosis   . Depression   . Fibroid uterus 12/18/2014  . Hyperlipemia   . Hypertension   . Memory changes   . Overactive bladder   . Pelvic pain in female 12/18/2014  . Prediabetes   . Right sided weakness   . Stroke ([place])   . Substance abuse (  [place])   . Syncope 02/07/2015  . TIA (transient ischemic attack)   . Tobacco use 04/14/2015    Patient Active Problem List   Diagnosis Date Noted  . Tobacco use 04/14/2015  . Alcohol use 04/14/2015  . Right sided weakness   . Syncope 02/07/2015  . Pelvic pain in female 12/18/2014  . Fibroid uterus 12/18/2014  . Hypertension 12/18/2014    Past Surgical History:  Procedure Laterality Date  . CESAREAN SECTION     x 2     OB History    Gravida  3   Para  3   Term      Preterm      AB       Living  3     SAB      TAB      Ectopic      Multiple      Live Births               Home Medications    Prior to Admission medications   Medication Sig Start Date End Date Taking? Authorizing Provider  amLODipine (NORVASC) 10 MG tablet Take 10 mg by mouth daily.   Yes [provider]  atorvastatin (LIPITOR) 10 MG tablet Take 10 mg by mouth at bedtime. 06/29/15  Yes [provider]  FLUoxetine (PROZAC) 40 MG capsule Take 40 mg by mouth daily. 05/05/17  Yes [provider]  ibuprofen (ADVIL,MOTRIN) 200 MG tablet Take 400 mg by mouth every 6 (six) hours as needed.   Yes [provider]  lisinopril (PRINIVIL,ZESTRIL) 20 MG tablet Take 20 mg by mouth 2 (two) times daily.   Yes [provider]  metFORMIN (GLUCOPHAGE) 500 MG tablet Take 500 mg by mouth 2 (two) times daily. 05/25/17  Yes [provider]  prazosin (MINIPRESS) 1 MG capsule Take 4 mg by mouth at bedtime.  07/01/16  Yes [provider]  traZODone (DESYREL) 100 MG tablet Take 100 mg by mouth at bedtime. 05/05/17  Yes [provider]  zolpidem (AMBIEN) 10 MG tablet Take 10 mg by mouth at bedtime. 06/30/17  Yes [provider]  polyethylene glycol powder (GLYCOLAX/MIRALAX) powder Mix 17 g with 4-8 ounces of juice and drink once a day 06/26/15   [provider]    Family History Family History  Problem Relation Age of Onset  . Stroke Mother   . Heart disease Maternal Grandmother   . Colon cancer Neg Hx   . Esophageal cancer Neg Hx   . Pancreatic cancer Neg Hx   . Stomach cancer Neg Hx   . Liver disease Neg Hx     Social History Social History   Tobacco Use  . Smoking status: Current Every Day Smoker  . Smokeless tobacco: Never Used  Substance Use Topics  . Alcohol use: Yes    Alcohol/week: 0.0 oz    Comment: 2 a day  . Drug use: No    Types: "Crack" cocaine    Comment: JANUARY 2017     Allergies   Grapefruit flavor  [flavoring agent]   Review of Systems Review of Systems  Respiratory: Negative for cough.   Gastrointestinal: Negative for diarrhea, nausea and vomiting.  Neurological: Positive for seizures, weakness and headaches.  All other systems reviewed and are negative.    Physical Exam Updated Vital Signs BP (!) 143/83 (BP Location: Left Arm)   Pulse 74   Temp 97.7 F (36.5 C) (Oral)   Ht 5\' 5"  (  1.651 m)   Wt 130.6 kg (288 lb)   LMP 05/29/2017 (Approximate)   SpO2 94%   BMI 47.93 kg/m   Physical Exam  Constitutional: She is oriented to person, place, and time. She appears well-developed and well-nourished. No distress.  obese  HENT:  Head: Normocephalic and atraumatic.  Mouth/Throat: Oropharynx is clear and moist.  Eyes: Pupils are equal, round, and reactive to light. Conjunctivae and EOM are normal.  No photophobia  Neck: Normal range of motion. Neck supple.  Cardiovascular: Normal rate, regular rhythm and intact distal pulses.  No murmur heard. Pulmonary/Chest: Effort normal and breath sounds normal. No respiratory distress. She has no wheezes. She has no rales.  Abdominal: Soft. She exhibits no distension. There is no tenderness. There is no rebound and no guarding.  Musculoskeletal: Normal range of motion. She exhibits no edema or tenderness.  Neurological: She is alert and oriented to person, place, and time.  4/5 strength in the right upper arm and hand grip.  Normal sensation in the legs.  No aphasia or pronator drift.  Normal finger to nose.  No CN deficits.  Skin: Skin is warm and dry. Capillary refill takes less than 2 seconds. No rash noted. No erythema.  Psychiatric: She has a normal mood and affect. Her behavior is normal.  Nursing note and vitals reviewed.    ED Treatments / Results  Labs (all labs ordered are listed, but only abnormal results are displayed) Labs Reviewed  CBC WITH DIFFERENTIAL/PLATELET  I-STAT CHEM 8, ED    EKG EKG  Interpretation  Date/Time:  Tuesday July 26 2017 10:48:58 EDT Ventricular Rate:  74 PR Interval:    QRS Duration: 90 QT Interval:  440 QTC Calculation: 489 R Axis:   44 Text Interpretation:  Sinus rhythm Consider left atrial enlargement Probable anteroseptal infarct, old No significant change since last tracing Confirmed by Blanchie Dessert [phone]) on 07/26/2017 11:11:54 AM   Radiology No results found.  Procedures Procedures (including critical care time)  Medications Ordered in ED Medications  metoCLOPramide (REGLAN) injection 10 mg (has no administration in time range)  diphenhydrAMINE (BENADRYL) injection 25 mg (has no administration in time range)  sodium chloride 0.9 % bolus 1,000 mL (has no administration in time range)     Initial Impression / Assessment and Plan / ED Course  I have reviewed the triage vital signs and the nursing notes.  Pertinent labs & imaging results that were available during my care of the patient were reviewed by me and considered in my medical decision making (see chart for details).    Patient presenting today from her doctor's office with questionable seizure-like activity.  Patient is undergoing evaluation for this already by neurology at [facility].  Question whether these are true seizures versus pseudoseizures from a psychiatric component.  Patient does have a history of stroke and TIA and had an MRI done at the end of April which showed no acute findings but a chronic left basal ganglia infarct.  Patient has some mild 4 out of 5 weakness in the right arm which they state always happens after 1 of these events.  She otherwise is at her baseline per her spouse.  She has no complaints other than a headache.  Patient will be given a headache cocktail.  EKG without acute findings.  CBC and i-STAT to ensure no evidence of anemia or electrolyte abnormalities pending.  At this time patient's neurologist does not want to start seizure medication until she  is  cleared by psychiatry.  12:18 PM Lab work is within normal limits.  On repeat evaluation patient is requesting to go home.  She states she feels her normal self.  This was after headache cocktail.  Patient has no headache at this time.  She was encouraged to continue follow-up with her neurologist and psychiatry and was discharged home with her spouse. Final Clinical Impressions(s) / ED Diagnoses   Final diagnoses:  Seizure-like activity [facility])    ED Discharge Orders    None       Blanchie Dessert, MD 07/26/17 1218

## 2017-07-26 NOTE — ED Triage Notes (Signed)
Patient arrived to ED via [facility] from clinic office - [facility]. EMS reports:  Patient seen at clinic for medications. C/o headache. When brought back into room, appeared to have seizure like activity. No LOC. Resolved. Facility administered Ativan 1 mg. EMS called. Patient was speaking on phone with R hand, but when EMS asked patient to put phone down for assessment, patient appeared to show R sided weakness and continued until EMS requested patient to transfer to stretcher. No difficulty or weakness noted in R side at that point.  VSS. BP 148/98, Pulse 74, CBG 114.  IV 20 gauge in L hand.

## 2017-07-26 NOTE — ED Notes (Signed)
Upon entering room to remove IV & discharge patient to home, patient noted standing dressing self unassisted with both hands. No distress. Smiling. Laughing. MAE with no difficulty. Patient walked to computer, and stood, while signing for her discharge. Patient walked to wheelchair to be transported out to truck with spouse.

## 2017-09-10 ENCOUNTER — Encounter

## 2017-09-10 ENCOUNTER — Encounter (HOSPITAL_COMMUNITY): Payer: Self-pay | Admitting: Psychiatry

## 2017-09-10 ENCOUNTER — Ambulatory Visit (INDEPENDENT_AMBULATORY_CARE_PROVIDER_SITE_OTHER): Payer: Medicaid Other | Admitting: Psychiatry

## 2017-09-10 ENCOUNTER — Other Ambulatory Visit: Payer: Self-pay

## 2017-09-10 ENCOUNTER — Encounter (INDEPENDENT_AMBULATORY_CARE_PROVIDER_SITE_OTHER): Payer: Self-pay

## 2017-09-10 VITALS — BP 142/81 | HR 80 | Ht 65.0 in | Wt 291.2 lb

## 2017-09-10 DIAGNOSIS — Z813 Family history of other psychoactive substance abuse and dependence: Secondary | ICD-10-CM

## 2017-09-10 DIAGNOSIS — G4700 Insomnia, unspecified: Secondary | ICD-10-CM

## 2017-09-10 DIAGNOSIS — F4312 Post-traumatic stress disorder, chronic: Secondary | ICD-10-CM | POA: Diagnosis not present

## 2017-09-10 DIAGNOSIS — R0683 Snoring: Secondary | ICD-10-CM

## 2017-09-10 DIAGNOSIS — G4719 Other hypersomnia: Secondary | ICD-10-CM | POA: Diagnosis not present

## 2017-09-10 DIAGNOSIS — F17210 Nicotine dependence, cigarettes, uncomplicated: Secondary | ICD-10-CM | POA: Diagnosis not present

## 2017-09-10 DIAGNOSIS — R450 Nervousness: Secondary | ICD-10-CM

## 2017-09-10 DIAGNOSIS — Z818 Family history of other mental and behavioral disorders: Secondary | ICD-10-CM

## 2017-09-10 DIAGNOSIS — Z811 Family history of alcohol abuse and dependence: Secondary | ICD-10-CM

## 2017-09-10 MED ORDER — SERTRALINE HCL 50 MG PO TABS: ORAL_TABLET | ORAL | 0 refills | Status: DC

## 2017-09-10 MED ORDER — MIRTAZAPINE 15 MG PO TABS: 15.0000 mg | ORAL_TABLET | Freq: Every day | ORAL | 0 refills | Status: DC
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
## 2017-10-07 ENCOUNTER — Ambulatory Visit (HOSPITAL_BASED_OUTPATIENT_CLINIC_OR_DEPARTMENT_OTHER): Payer: Medicaid Other | Attending: Psychiatry | Admitting: Internal Medicine

## 2017-10-07 DIAGNOSIS — G4719 Other hypersomnia: Secondary | ICD-10-CM

## 2017-10-07 DIAGNOSIS — G4736 Sleep related hypoventilation in conditions classified elsewhere: Secondary | ICD-10-CM | POA: Insufficient documentation

## 2017-10-07 DIAGNOSIS — G4733 Obstructive sleep apnea (adult) (pediatric): Secondary | ICD-10-CM | POA: Insufficient documentation

## 2017-10-07 DIAGNOSIS — R0683 Snoring: Secondary | ICD-10-CM

## 2017-10-07 DIAGNOSIS — G4710 Hypersomnia, unspecified: Secondary | ICD-10-CM | POA: Diagnosis present

## 2017-10-21 DIAGNOSIS — E119 Type 2 diabetes mellitus without complications: Secondary | ICD-10-CM | POA: Insufficient documentation

## 2017-10-21 DIAGNOSIS — E1169 Type 2 diabetes mellitus with other specified complication: Secondary | ICD-10-CM | POA: Insufficient documentation

## 2017-10-21 HISTORY — DX: Type 2 diabetes mellitus without complications: E11.9

## 2017-10-23 DIAGNOSIS — R0683 Snoring: Secondary | ICD-10-CM

## 2017-10-23 NOTE — Procedures (Signed)
  Patient Name: Denise [name], Denise [name] Date: 10/07/2017 Gender: Female D.O.B: 09/26/1970 Age (years): 47 Referring Provider: Lulu Riding Height (inches): 48 Interpreting Physician: Baird Lyons MD, ABSM Weight (lbs): 291 RPSGT: Jacolyn Reedy BMI: 48 MRN: [id number] Neck Size: <br>  CLINICAL INFORMATION Sleep Study Type: HST Indication for sleep study: Excessive Daytime Sleepiness Epworth Sleepiness Score: 21  SLEEP STUDY TECHNIQUE A multi-channel overnight portable sleep study was performed. The channels recorded were: nasal airflow, thoracic respiratory movement, and oxygen saturation with a pulse oximetry. Snoring was also monitored.  MEDICATIONS Patient self administered medications include: none reported.  SLEEP ARCHITECTURE Patient was studied for 441.9 minutes. The sleep efficiency was 100.0 % and the patient was supine for 75.5%. The arousal index was 0.0 per hour.  RESPIRATORY PARAMETERS The overall AHI was 25.0 per hour, with a central apnea index of 0.1 per hour.  The oxygen nadir was 69% during sleep.  CARDIAC DATA Mean heart rate during sleep was 81.3 bpm.  IMPRESSIONS - Moderate obstructive sleep apnea occurred during this study (AHI = 25.0/h). - No significant central sleep apnea occurred during this study (CAI = 0.1/h). - Oxygen desaturation was noted during this study (Min O2 = 69%). - Patient snored.  DIAGNOSIS - Obstructive Sleep Apnea (327.23 [G47.33 ICD-10]) - Nocturnal Hypoxemia (327.26 [G47.36 ICD-10])  RECOMMENDATIONS - Suggest CPAP titration sleep study or DME autopap. Other options would be based on clinical judgment. - Be careful with alcohol, sedatives and other CNS depressants that may worsen sleep apnea and disrupt normal sleep architecture. - Sleep hygiene should be reviewed to assess factors that may improve sleep quality. - Weight management and regular exercise should be initiated or continued.  [Electronically  signed] 10/23/2017 01:42 PM  Baird Lyons MD, [place], American Board of Sleep Medicine   NPI: [id number]                           [place], [place] [facility]  ELECTRONICALLY SIGNED ON:  10/23/2017, 1:39 PM [place] PH: [phone]   FX: [phone] [place]

## 2017-11-03 ENCOUNTER — Encounter (HOSPITAL_COMMUNITY): Payer: Self-pay | Admitting: Psychiatry

## 2017-11-03 ENCOUNTER — Ambulatory Visit (INDEPENDENT_AMBULATORY_CARE_PROVIDER_SITE_OTHER): Payer: Medicaid Other | Admitting: Psychiatry

## 2017-11-03 VITALS — BP 132/80 | Ht 65.0 in | Wt 289.0 lb

## 2017-11-03 DIAGNOSIS — F332 Major depressive disorder, recurrent severe without psychotic features: Secondary | ICD-10-CM | POA: Diagnosis not present

## 2017-11-03 DIAGNOSIS — F4010 Social phobia, unspecified: Secondary | ICD-10-CM

## 2017-11-03 DIAGNOSIS — F4312 Post-traumatic stress disorder, chronic: Secondary | ICD-10-CM

## 2017-11-03 HISTORY — DX: Social phobia, unspecified: F40.10

## 2017-11-03 MED ORDER — SERTRALINE HCL 100 MG PO TABS: ORAL_TABLET | ORAL | 0 refills | Status: DC

## 2017-11-03 NOTE — Progress Notes (Signed)
[facility] MD/PA/NP OP Progress Note  11/03/2017 11:32 AM Denise [name]  MRN:  [id number]  Chief Complaint:  Chief Complaint    Post-Traumatic Stress Disorder     HPI: I am meeting with the patient for the first time today.  She had her initial psychiatric evaluation done in August by Dr. Daron Offer who is no longer in the clinic.  I will be managing patient's psychiatric medications from now on.  Pt states Remeron is not helping with sleep. Pt is concerned because her headaches are worse at night when she lies down after taking Remeron. The pain is extreme and lasts most of the day. Pt is not sleeping well at night and is fatigued.   Pt has nightmares frequently. Her husband tells her she often curses and cries out in her sleep. Pt has HV but feels safe with her husband. She states this the most safe she has ever felt. She denies much memory of the trauma. At times she will go the bathroom and will "zone out" on the toilet for an unknown amount of time. She is not sure if it nonepileptic seizures or something else. Pt only leaves home when she has an appointment. Her social anxiety is unchanged.   Pt continues to feel depressed and irritable. She has poor concentration and memory. Pt relies on her husband for help. She is tired and often feels unmotivated and numb. Pt reports that Zoloft seems to be making a mild difference. She now thinks about doing some chores and can eventually get herself to do a little. She is better when her husband comes back from work. Pt denies SI/HI.   Pt remains sober from alcohol and illicit drug use.   She is no longer in therapy due financial stressors.   Visit Diagnosis:    ICD-10-CM   1. Chronic post-traumatic stress disorder (PTSD) F43.12 sertraline (ZOLOFT) 100 MG tablet  2. 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 Social anxiety disorder F40.10        Past Psychiatric History: Patient denies  any history of inpatient psychiatric treatment.  She had an outpatient psychiatrist but did not like the treatment treatment.  She tried a few different medications in the past- Ambien, Prozac, Prazosin, Remeron.  Patient denies any history of suicide attempts  Past Medical History:  Past Medical History:  Diagnosis Date  . Abnormal EKG   . AD (atopic dermatitis)   . Alcohol use 04/14/2015  . Anxiety   . Blood transfusion without reported diagnosis   . Depression   . Fibroid uterus 12/18/2014  . Hyperlipemia   . Hypertension   . Memory changes   . Overactive bladder   . Pelvic pain in female 12/18/2014  . Prediabetes   . Right sided weakness   . Seizures ([place])   . Stroke ([place])   . Substance abuse ([place])   . Syncope 02/07/2015  . TIA (transient ischemic attack)   . Tobacco use 04/14/2015    Past Surgical History:  Procedure Laterality Date  . CESAREAN SECTION     x 2    Family Psychiatric and Medical History:  Family History  Problem Relation Age of Onset  . Stroke Mother   . Anxiety disorder Mother   . Drug abuse Mother   . Alcohol abuse Mother   . Depression Mother   . Heart disease Maternal Grandmother   . Drug abuse Father   . Anxiety disorder Father   .  Colon cancer Neg Hx   . Esophageal cancer Neg Hx   . Pancreatic cancer Neg Hx   . Stomach cancer Neg Hx   . Liver disease Neg Hx     Social History:  Social History   Socioeconomic History  . Marital status: Married    Spouse name: Journalist, newspaper  . Number of children: 3  . Years of education: Not on file  . Highest education level: 6th grade  Occupational History  . Not on file  Social Needs  . Financial resource strain: Very hard  . Food insecurity:    Worry: Often true    Inability: Often true  . Transportation needs:    Medical: Yes    Non-medical: Yes  Tobacco Use  . Smoking status: Current Every Day Smoker    Packs/day: 0.50    Types: Cigarettes  . Smokeless tobacco: Never Used  Substance and  Sexual Activity  . Alcohol use: Not Currently    Alcohol/week: 0.0 standard drinks    Comment: 2 a day  . Drug use: No    Types: "Crack" cocaine    Comment: JANUARY 2017  . Sexual activity: Yes    Birth control/protection: None  Lifestyle  . Physical activity:    Days per week: 0 days    Minutes per session: 0 min  . Stress: Very much  Relationships  . Social connections:    Talks on phone: Not on file    Gets together: Not on file    Attends religious service: Never    Active member of club or organization: No    Attends meetings of clubs or organizations: Never    Relationship status: Married  Other Topics Concern  . Not on file  Social History Narrative  . Not on file    Allergies:  Allergies  Allergen Reactions  . Grapefruit Flavor [Flavoring Agent] Other (See Comments)    unknown    Metabolic Disorder Labs: Lab Results  Component Value Date   HGBA1C 5.8 (H) 02/07/2015   MPG 120 02/07/2015   No results found for: PROLACTIN Lab Results  Component Value Date   CHOL 262 (H) 02/08/2015   TRIG 352 (H) 02/08/2015   HDL 35 (L) 02/08/2015   CHOLHDL 7.5 02/08/2015   VLDL 70 (H) 02/08/2015   LDLCALC 157 (H) 02/08/2015   Lab Results  Component Value Date   TSH 2.587 02/07/2015    Therapeutic Level Labs: No results found for: LITHIUM No results found for: VALPROATE No components found for:  CBMZ  Current Medications: Current Outpatient Medications  Medication Sig Dispense Refill  . amLODipine (NORVASC) 10 MG tablet Take 10 mg by mouth daily.    Marland Kitchen atorvastatin (LIPITOR) 10 MG tablet Take 10 mg by mouth at bedtime.  0  . lisinopril (PRINIVIL,ZESTRIL) 20 MG tablet Take 20 mg by mouth 2 (two) times daily.    . metFORMIN (GLUCOPHAGE) 500 MG tablet Take 500 mg by mouth 2 (two) times daily.  3  . polyethylene glycol powder (GLYCOLAX/MIRALAX) powder Mix 17 g with 4-8 ounces of juice and drink once a day  0  . sertraline (ZOLOFT) 100 MG tablet Take 1.5 tablets (150  mg total) by mouth every morning for 7 days, THEN 2 tablets (200 mg total) every morning. 175 tablet 0   Current Facility-Administered Medications  Medication Dose Route Frequency Provider Last Rate Last Dose  . 0.9 %  sodium chloride infusion  500 mL Intravenous Continuous Danis, Kirke Corin, MD  Musculoskeletal: Strength & Muscle Tone: within normal limits Gait & Station: broad based Patient leans: N/A  Psychiatric Specialty Exam: Review of Systems  Constitutional: Negative for chills, diaphoresis and fever.  Neurological: Positive for headaches. Negative for dizziness and tingling.    Blood pressure 132/80, height 5\' 5"  (1.651 m), weight 289 lb (131.1 kg).Body mass index is 48.09 kg/m.  General Appearance: Casual  Eye Contact:  Fair  Speech:  Normal Rate  Volume:  Normal  Mood:  Anxious and Depressed  Affect:  Congruent and Constricted  Thought Process:  Coherent and Descriptions of Associations: Intact, concrete and patient has difficulty sharing her thoughts.  She attempts to explain herself and does achieve the desired results after several times  Orientation:  Full (Time, Place, and Person)  Thought Content: Logical   Suicidal Thoughts:  No  Homicidal Thoughts:  No  Memory:  Immediate;   Poor  Judgement:  Fair  Insight:  Shallow  Psychomotor Activity:  Normal  Concentration:  Concentration: Fair  Recall:  Poor  Fund of Knowledge: Poor  Language: Fair  Akathisia:  No  Handed:  Right  AIMS (if indicated): not done  Assets:  Desire for Improvement Housing Social Support  ADL's:  Intact  Cognition: WNL  Sleep:  Poor   Screenings: GAD-7     Office Visit from 07/15/2016 in [place] for [facility]  Total GAD-7 Score  5    Mini-Mental     Office Visit from 04/14/2015 in [facility]  Total Score (max 30 points )  15    PHQ2-9     Office Visit from 07/15/2016 in [place] for [facility]  PHQ-2 Total Score  6  PHQ-9  Total Score  19       Assessment and Plan: PTSD; MDD- recurrent, severe without psychotic features; Social anxiety disorder; Nicotine use disorder  I reviewed the chart and the initial psychiatric evaluation done August 2019 by Dr. Daron Offer.   Medication management with supportive therapy. Risks and benefits, side effects and alternative treatment options discussed with patient. Pt was given an opportunity to ask questions about medication, illness, and treatment. All current psychiatric medications have been reviewed and discussed with the patient and adjusted as clinically appropriate. The patient has been provided an accurate and updated list of the medications being now prescribed. Pt verbalized understanding and verbal consent obtained for treatment.  The risk of un-intended pregnancy is high based on the fact that pt reports that she is not using any birth control and might be pre-menopausal. Pt is aware that these meds carry a teratogenic risk. Pt will discuss plan of action if she does or plans to become pregnant in the future.  Status of current problems: ongoing  Meds: increase Zoloft 150 mg p.o. X 7 days then increase 200mg  po qD D/c Remeron  I will hold off on prescribing any sleep aid until we have the results of her sleep study  Labs: reviewed labs done July 26, 2017 BMP and CBC are within normal limits  Therapy: brief supportive therapy provided. Discussed psychosocial stressors in detail.    Consultations: Sleep study done in August 2019- waiting on results Declined therapy Encouraged to follow up with PCP as needed  Pt's acute risk factors for suicide are low.  Pt denies SI and is at an acute low risk for suicide. Patient told to call clinic if any problems occur. Patient advised to go to ER if they should develop SI/HI, side effects, or if  symptoms worsen. Pt has crisis numbers to call if needed. Pt acknowledged and agreed with plan and verbalized understanding.  F/up in 2  months or sooner if needed  The duration of this appointment visit was 30 minutes of face-to-face time with the patient.  Greater than 50% of this time was spent in counseling, explanation of  diagnosis, planning of further management, and coordination of care   Charlcie Cradle, MD 11/03/2017, 11:32 AM

## 2017-11-09 ENCOUNTER — Emergency Department (HOSPITAL_COMMUNITY): Payer: Medicaid Other

## 2017-11-09 ENCOUNTER — Encounter (HOSPITAL_COMMUNITY): Payer: Self-pay

## 2017-11-09 ENCOUNTER — Emergency Department (HOSPITAL_COMMUNITY)
Admission: EM | Admit: 2017-11-09 | Discharge: 2017-11-09 | Disposition: A | Discharge status: Home / Self Care | Payer: Medicaid Other | Attending: Emergency Medicine | Admitting: Emergency Medicine

## 2017-11-09 ENCOUNTER — Other Ambulatory Visit: Payer: Self-pay

## 2017-11-09 DIAGNOSIS — R1084 Generalized abdominal pain: Secondary | ICD-10-CM | POA: Diagnosis present

## 2017-11-09 DIAGNOSIS — F17210 Nicotine dependence, cigarettes, uncomplicated: Secondary | ICD-10-CM | POA: Insufficient documentation

## 2017-11-09 DIAGNOSIS — K922 Gastrointestinal hemorrhage, unspecified: Secondary | ICD-10-CM | POA: Diagnosis not present

## 2017-11-09 DIAGNOSIS — Z79899 Other long term (current) drug therapy: Secondary | ICD-10-CM | POA: Insufficient documentation

## 2017-11-09 DIAGNOSIS — Z8673 Personal history of transient ischemic attack (TIA), and cerebral infarction without residual deficits: Secondary | ICD-10-CM | POA: Insufficient documentation

## 2017-11-09 DIAGNOSIS — I10 Essential (primary) hypertension: Secondary | ICD-10-CM | POA: Diagnosis not present

## 2017-11-09 DIAGNOSIS — Z7984 Long term (current) use of oral hypoglycemic drugs: Secondary | ICD-10-CM | POA: Diagnosis not present

## 2017-11-09 LAB — URINALYSIS, ROUTINE W REFLEX MICROSCOPIC
BILIRUBIN URINE: NEGATIVE
Glucose, UA: NEGATIVE mg/dL
HGB URINE DIPSTICK: NEGATIVE
Ketones, ur: NEGATIVE mg/dL
Leukocytes, UA: NEGATIVE
NITRITE: NEGATIVE
PH: 5 (ref 5.0–8.0)
Protein, ur: NEGATIVE mg/dL
SPECIFIC GRAVITY, URINE: 1.021 (ref 1.005–1.030)

## 2017-11-09 LAB — COMPREHENSIVE METABOLIC PANEL
ALT: 23 U/L (ref 0–44)
ANION GAP: 11 (ref 5–15)
AST: 25 U/L (ref 15–41)
Albumin: 3.8 g/dL (ref 3.5–5.0)
Alkaline Phosphatase: 104 U/L (ref 38–126)
BUN: 8 mg/dL (ref 6–20)
CHLORIDE: 105 mmol/L (ref 98–111)
CO2: 26 mmol/L (ref 22–32)
Calcium: 9.3 mg/dL (ref 8.9–10.3)
Creatinine, Ser: 0.86 mg/dL (ref 0.44–1.00)
GFR calc Af Amer: >60 mL/min (ref >60)
GFR calc non Af Amer: >60 mL/min (ref >60)
GLUCOSE: 99 mg/dL (ref 70–99)
POTASSIUM: 4.1 mmol/L (ref 3.5–5.1)
SODIUM: 142 mmol/L (ref 135–145)
Total Bilirubin: 0.5 mg/dL (ref 0.3–1.2)
Total Protein: 7 g/dL (ref 6.5–8.1)

## 2017-11-09 LAB — CBC
HEMATOCRIT: 42.4 % (ref 36.0–46.0)
HEMOGLOBIN: 13.4 g/dL (ref 12.0–15.0)
MCH: 28.5 pg (ref 26.0–34.0)
MCHC: 31.6 g/dL (ref 30.0–36.0)
MCV: 90.2 fL (ref 80.0–100.0)
Platelets: 425 10*3/uL — ABNORMAL HIGH (ref 150–400)
RBC: 4.7 MIL/uL (ref 3.87–5.11)
RDW: 14.7 % (ref 11.5–15.5)
WBC: 9.7 10*3/uL (ref 4.0–10.5)
nRBC: 0 % (ref 0.0–0.2)

## 2017-11-09 LAB — I-STAT BETA HCG BLOOD, ED (MC, WL, AP ONLY)

## 2017-11-09 LAB — TYPE AND SCREEN
ABO/RH(D): A POS
ANTIBODY SCREEN: NEGATIVE

## 2017-11-09 LAB — LIPASE, BLOOD: LIPASE: 44 U/L (ref 11–51)

## 2017-11-09 MED ORDER — SODIUM CHLORIDE 0.9 % IJ SOLN
INTRAMUSCULAR | Status: AC
  Filled 2017-11-09: qty 50

## 2017-11-09 MED ORDER — IOPAMIDOL (ISOVUE-300) INJECTION 61%
100.0000 mL | Freq: Once | INTRAVENOUS | Status: AC | PRN
  Administered 2017-11-09: 100 mL via INTRAVENOUS

## 2017-11-09 MED ORDER — IOPAMIDOL (ISOVUE-300) INJECTION 61%
INTRAVENOUS | Status: AC
  Filled 2017-11-09: qty 100

## 2017-11-09 MED ORDER — MORPHINE SULFATE (PF) 4 MG/ML IV SOLN
4.0000 mg | Freq: Once | INTRAVENOUS | Status: AC
  Administered 2017-11-09: 4 mg via INTRAVENOUS
  Filled 2017-11-09: qty 1

## 2017-11-09 NOTE — Discharge Instructions (Addendum)
Please return to the emergency department for new or worsening symptoms  Please schedule an appointment with the outpatient GI doctors

## 2017-11-09 NOTE — ED Provider Notes (Signed)
[facility] DEPT Provider Note   CSN: [id number] Arrival date & time: 11/09/17  1158     History   Chief Complaint Chief Complaint  Patient presents with  . Abdominal Pain  . Rectal Bleeding    HPI [name] Lienhard is a 47 y.o. female.  HPI Patient is a 47 year old female presents the emergency department with 4 days of generalized abdominal cramping and some bright red blood mixed with brown stool.  She has been taking a laxative every day.  She reports feeling slightly lightheaded.  No history of significant GI bleed before.  No history of blood transfusion.  She states that she has 1-2 episodes of stools with a small amount of blood daily.  No chest pain or shortness of breath.  Denies nausea vomiting.  No fevers.   Past Medical History:  Diagnosis Date  . Abnormal EKG   . AD (atopic dermatitis)   . Alcohol use 04/14/2015  . Anxiety   . Blood transfusion without reported diagnosis   . Depression   . Fibroid uterus 12/18/2014  . Hyperlipemia   . Hypertension   . Memory changes   . Overactive bladder   . Pelvic pain in female 12/18/2014  . Prediabetes   . Right sided weakness   . Seizures ([place])   . Stroke ([place])   . Substance abuse ([place])   . Syncope 02/07/2015  . TIA (transient ischemic attack)   . Tobacco use 04/14/2015    Patient Active Problem List   Diagnosis Date Noted  . Severe episode of recurrent major depressive disorder, without psychotic features ([place]) 11/03/2017  . Social anxiety disorder 11/03/2017  . Tobacco use 04/14/2015  . Alcohol use 04/14/2015  . Right sided weakness   . Syncope 02/07/2015  . Pelvic pain in female 12/18/2014  . Fibroid uterus 12/18/2014  . Hypertension 12/18/2014    Past Surgical History:  Procedure Laterality Date  . CESAREAN SECTION     x 2     OB History    Gravida  3   Para  3   Term      Preterm      AB      Living  3     SAB      TAB      Ectopic      Multiple      Live Births               Home Medications    Prior to Admission medications   Medication Sig Start Date End Date Taking? Authorizing Provider  amLODipine (NORVASC) 10 MG tablet Take 10 mg by mouth daily.   Yes [provider]  atorvastatin (LIPITOR) 10 MG tablet Take 10 mg by mouth at bedtime. 06/29/15  Yes [provider]  ibuprofen (ADVIL,MOTRIN) 200 MG tablet Take 400 mg by mouth daily as needed for mild pain.   Yes [provider]  lisinopril (PRINIVIL,ZESTRIL) 20 MG tablet Take 20 mg by mouth 2 (two) times daily.   Yes [provider]  metFORMIN (GLUCOPHAGE) 500 MG tablet Take 500 mg by mouth 2 (two) times daily. 05/25/17  Yes [provider]  mirtazapine (REMERON) 15 MG tablet Take 15 mg by mouth daily with supper. 11/04/17  Yes [provider]  polyethylene glycol powder (GLYCOLAX/MIRALAX) powder Mix 17 g with 4-8 ounces of juice and drink once a day 06/26/15  Yes [provider]  sertraline (ZOLOFT) 100 MG tablet Take 1.5 tablets (  150 mg total) by mouth every morning for 7 days, THEN 2 tablets (200 mg total) every morning. 11/03/17 02/08/18 Yes Charlcie Cradle, MD    Family History Family History  Problem Relation Age of Onset  . Stroke Mother   . Anxiety disorder Mother   . Drug abuse Mother   . Alcohol abuse Mother   . Depression Mother   . Heart disease Maternal Grandmother   . Drug abuse Father   . Anxiety disorder Father   . Colon cancer Neg Hx   . Esophageal cancer Neg Hx   . Pancreatic cancer Neg Hx   . Stomach cancer Neg Hx   . Liver disease Neg Hx     Social History Social History   Tobacco Use  . Smoking status: Current Every Day Smoker    Packs/day: 0.50    Types: Cigarettes  . Smokeless tobacco: Never Used  Substance Use Topics  . Alcohol use: Not Currently    Alcohol/week: 0.0 standard drinks    Comment: 2 a day  . Drug use: No    Types: "Crack" cocaine    Comment:  JANUARY 2017     Allergies   Grapefruit flavor [flavoring agent]   Review of Systems Review of Systems  All other systems reviewed and are negative.    Physical Exam Updated Vital Signs BP 134/82   Pulse 81   Temp 97.6 F (36.4 C) (Oral)   Resp 16   Ht 5\' 5"  (1.651 m)   Wt 135.2 kg   SpO2 99%   BMI 49.59 kg/m   Physical Exam  Constitutional: She is oriented to person, place, and time. She appears well-developed and well-nourished. No distress.  HENT:  Head: Normocephalic and atraumatic.  Eyes: EOM are normal.  Neck: Normal range of motion.  Cardiovascular: Normal rate, regular rhythm and normal heart sounds.  Pulmonary/Chest: Effort normal and breath sounds normal.  Abdominal: Soft. She exhibits no distension. There is no tenderness.  Genitourinary:  Genitourinary Comments: Rectal examination performed with chaperone present.  No significant rectal bleeding noted.  Trace mucousy blood noted.  No external hemorrhoids.  No fissures.  Musculoskeletal: Normal range of motion.  Neurological: She is alert and oriented to person, place, and time.  Skin: Skin is warm and dry.  Psychiatric: She has a normal mood and affect. Judgment normal.  Nursing note and vitals reviewed.    ED Treatments / Results  Labs (all labs ordered are listed, but only abnormal results are displayed) Labs Reviewed  CBC - Abnormal; Notable for the following components:      Result Value   Platelets 425 (*)    All other components within normal limits  URINALYSIS, ROUTINE W REFLEX MICROSCOPIC - Abnormal; Notable for the following components:   APPearance HAZY (*)    All other components within normal limits  LIPASE, BLOOD  COMPREHENSIVE METABOLIC PANEL  I-STAT BETA HCG BLOOD, ED ([facility], [facility], [facility] ONLY)  TYPE AND SCREEN    EKG None  Radiology Ct Abdomen Pelvis W Contrast  Result Date: 11/09/2017 CLINICAL DATA:  Acute generalized abdominal pain and lower GI bleeding EXAM: CT ABDOMEN AND  PELVIS WITH CONTRAST TECHNIQUE: Multidetector CT imaging of the abdomen and pelvis was performed using the standard protocol following bolus administration of intravenous contrast. CONTRAST:  100 ML ISOVUE-300 IOPAMIDOL (ISOVUE-300) INJECTION 61% IV. No oral contrast. COMPARISON:  04/13/2016 FINDINGS: Lower chest: Minimal bibasilar atelectasis Hepatobiliary: Gallbladder and liver normal appearance Pancreas: Normal appearance Spleen: Normal appearance Adrenals/Urinary  Tract: Adrenal glands, kidneys, ureters, and decompressed bladder normal appearance Stomach/Bowel: Normal appendix. Minimal sigmoid diverticulosis. Stomach and bowel loops otherwise normal appearance. No bowel wall thickening or acute inflammatory process. Vascular/Lymphatic: Scattered pelvic phleboliths. Atherosclerotic calcifications aorta and iliac arteries. Reproductive: Calcified uterine leiomyomata up to 3.9 cm diameter. Unremarkable adnexa. Other: No free air, free fluid or acute inflammatory process. No definite hernia. Musculoskeletal: No acute osseous findings. Degenerative disc disease changes L5-S1. IMPRESSION: Minimal sigmoid diverticulosis without evidence of diverticulitis. Calcified uterine leiomyomata up to 3.9 cm diameter. No acute intra-abdominal or intrapelvic abnormalities. Electronically Signed   By: Lavonia Dana M.D.   On: 11/09/2017 15:11    Procedures Procedures (including critical care time)  Medications Ordered in ED Medications  sodium chloride 0.9 % injection (has no administration in time range)  morphine 4 MG/ML injection 4 mg (4 mg Intravenous Given 11/09/17 1329)  iopamidol (ISOVUE-300) 61 % injection 100 mL (100 mLs Intravenous Contrast Given 11/09/17 1456)     Initial Impression / Assessment and Plan / ED Course  I have reviewed the triage vital signs and the nursing notes.  Pertinent labs & imaging results that were available during my care of the patient were reviewed by me and considered in my  medical decision making (see chart for details).     Stable hemoglobin.  Vital signs stable.  CT imaging without acute intra-abdominal pathology.  Diverticulosis without active diverticulitis.  Discharged home with GI follow-up.  Patient understands return the emergency department for new or worsening symptoms  Final Clinical Impressions(s) / ED Diagnoses   Final diagnoses:  Lower GI bleeding    ED Discharge Orders    None       Jola Schmidt, MD 11/09/17 1546

## 2017-11-09 NOTE — ED Notes (Signed)
Bed: WA06 Expected date:  Expected time:  Means of arrival:  Comments: 47 yo f abd pain

## 2017-11-09 NOTE — ED Notes (Signed)
Patient transported to CT 

## 2017-11-09 NOTE — ED Notes (Signed)
Lt green, lav, and gold top sent down to lab.

## 2017-11-09 NOTE — ED Triage Notes (Addendum)
Pt BIB EMS from home with complaints of abd pain and rectal bleeding x3 days. Pt reports taking a laxative everyday. Pt reports feeling dizzy upon standing.  142/86 HR 84

## 2017-11-21 DIAGNOSIS — M545 Low back pain, unspecified: Secondary | ICD-10-CM

## 2017-11-21 HISTORY — DX: Low back pain, unspecified: M54.50

## 2017-12-01 ENCOUNTER — Ambulatory Visit: Payer: Self-pay | Admitting: Gastroenterology

## 2017-12-27 ENCOUNTER — Ambulatory Visit: Payer: Self-pay | Admitting: Gastroenterology

## 2017-12-27 ENCOUNTER — Telehealth: Payer: Self-pay

## 2017-12-27 NOTE — Telephone Encounter (Signed)
-----   Message from Dow Adolph sent at 12/27/2017 11:57 AM EST ----- Pt's husband canceled her appt for this afternoon bc she had a seizure today

## 2018-01-05 ENCOUNTER — Encounter (HOSPITAL_COMMUNITY): Payer: Self-pay | Admitting: Psychiatry

## 2018-01-05 ENCOUNTER — Ambulatory Visit (INDEPENDENT_AMBULATORY_CARE_PROVIDER_SITE_OTHER): Payer: Medicaid Other | Admitting: Psychiatry

## 2018-01-05 VITALS — BP 108/69 | HR 88 | Ht 65.75 in | Wt 293.8 lb

## 2018-01-05 DIAGNOSIS — F333 Major depressive disorder, recurrent, severe with psychotic symptoms: Secondary | ICD-10-CM

## 2018-01-05 DIAGNOSIS — F4312 Post-traumatic stress disorder, chronic: Secondary | ICD-10-CM | POA: Diagnosis not present

## 2018-01-05 MED ORDER — SERTRALINE HCL 100 MG PO TABS: 200.0000 mg | ORAL_TABLET | ORAL | 0 refills | Status: DC

## 2018-01-05 MED ORDER — BUSPIRONE HCL 5 MG PO TABS: 5.0000 mg | ORAL_TABLET | Freq: Two times a day (BID) | ORAL | 0 refills | Status: DC

## 2018-01-05 NOTE — Progress Notes (Signed)
[facility] MD/PA/NP OP Progress Note  01/05/2018 11:06 AM Kenidi Elenbaas  MRN:  [id number]  Chief Complaint:  Chief Complaint    Post-Traumatic Stress Disorder; Depression; Follow-up     HPI: "You got a healthy".  Patient reports that she has been experiencing auditory and visual hallucinations almost daily for the last 6 weeks.  She does not really know how long it is been going on as her husband told her that she is carrying on conversations as though there are people in the room when she is alone.  She tells me that she hears and sees random people sitting in her home and carries on full conversations with them.  The topics are usually random and she does not typically see the same people over and over.  She did not find it distressing until her husband told her that there was no one in the room with her.  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  She states that she has periods of time where she starts profusely sweating and feels chilled and weak.  Her husband will help her to bed and patient does not usually remember much after that.  Her husband has been telling her that she is having seizures quite frequently.  Patient is not sleeping for more than a few hours throughout the day or night.  She is exhausted.  She states that she was not prescribed a CPAP or BiPAP machine and has not seen any sleep doctor for sleep medications.  She continues to have frequent nightmares.  Her hypervigilance is unchanged.  She has ongoing social anxiety and prefers to be alone whenever possible.  She is depressed and irritable.  Her memory is poor.  She states that she can read but she is unable to spell which is a big change for her.  She states that she feels sad, tired, unmotivated, lonely but uninterested in socializing.  She denies SI/HI.  In the past she felt the Zoloft was helping with some motivation as she was doing some chores around the house but now that has changed to and she does not know  why.   We that she has been clean from all illicit substances and alcohol for almost 2 years now.  Visit Diagnosis:    ICD-10-CM   1. Severe episode of recurrent major depressive disorder, with psychotic features (HCC) F33.3 sertraline (ZOLOFT) 100 MG tablet    busPIRone (BUSPAR) 5 MG tablet  2. Chronic post-traumatic stress disorder (PTSD) F43.12 sertraline (ZOLOFT) 100 MG tablet    busPIRone (BUSPAR) 5 MG tablet       Past Psychiatric History: Patient denies any history of inpatient psychiatric treatment.  She had an outpatient psychiatrist but did not like the treatment treatment.  She tried a few different medications in the past- Ambien, Prozac, Prazosin, Remeron.  Patient denies any history of suicide attempts  Past Medical History:  Past Medical History:  Diagnosis Date  . Abnormal EKG   . AD (atopic dermatitis)   . Alcohol use 04/14/2015  . Anxiety   . Blood transfusion without reported diagnosis   . Depression   . Fibroid uterus 12/18/2014  . Hyperlipemia   . Hypertension   . Memory changes   . Overactive bladder   . Pelvic pain in female 12/18/2014  . Prediabetes   . Right sided weakness   . Seizures ([place])   . Stroke ([place])   . Substance abuse ([place])   . Syncope 02/07/2015  . TIA (transient ischemic attack)   .  Tobacco use 04/14/2015    Past Surgical History:  Procedure Laterality Date  . CESAREAN SECTION     x 2    Family Psychiatric and Medical History:  Family History  Problem Relation Age of Onset  . Stroke Mother   . Anxiety disorder Mother   . Drug abuse Mother   . Alcohol abuse Mother   . Depression Mother   . Heart disease Maternal Grandmother   . Drug abuse Father   . Anxiety disorder Father   . Colon cancer Neg Hx   . Esophageal cancer Neg Hx   . Pancreatic cancer Neg Hx   . Stomach cancer Neg Hx   . Liver disease Neg Hx     Social History:  Social History   Socioeconomic History  . Marital status: Married    Spouse name: Journalist, newspaper   . Number of children: 3  . Years of education: Not on file  . Highest education level: 6th grade  Occupational History  . Not on file  Social Needs  . Financial resource strain: Very hard  . Food insecurity:    Worry: Often true    Inability: Often true  . Transportation needs:    Medical: Yes    Non-medical: Yes  Tobacco Use  . Smoking status: Current Every Day Smoker    Packs/day: 0.50    Types: Cigarettes  . Smokeless tobacco: Never Used  Substance and Sexual Activity  . Alcohol use: Not Currently    Alcohol/week: 0.0 standard drinks    Comment: 2 a day  . Drug use: No    Types: "Crack" cocaine    Comment: JANUARY 2017  . Sexual activity: Yes    Birth control/protection: None  Lifestyle  . Physical activity:    Days per week: 0 days    Minutes per session: 0 min  . Stress: Very much  Relationships  . Social connections:    Talks on phone: Not on file    Gets together: Not on file    Attends religious service: Never    Active member of club or organization: No    Attends meetings of clubs or organizations: Never    Relationship status: Married  Other Topics Concern  . Not on file  Social History Narrative  . Not on file    Allergies:  Allergies  Allergen Reactions  . Grapefruit Flavor [Flavoring Agent] Other (See Comments)    Drug interaction    Metabolic Disorder Labs: Lab Results  Component Value Date   HGBA1C 5.8 (H) 02/07/2015   MPG 120 02/07/2015   No results found for: PROLACTIN Lab Results  Component Value Date   CHOL 262 (H) 02/08/2015   TRIG 352 (H) 02/08/2015   HDL 35 (L) 02/08/2015   CHOLHDL 7.5 02/08/2015   VLDL 70 (H) 02/08/2015   LDLCALC 157 (H) 02/08/2015   Lab Results  Component Value Date   TSH 2.587 02/07/2015    Therapeutic Level Labs: No results found for: LITHIUM No results found for: VALPROATE No components found for:  CBMZ  Current Medications: Current Outpatient Medications  Medication Sig Dispense Refill  .  amLODipine (NORVASC) 10 MG tablet Take 10 mg by mouth daily.    Marland Kitchen atorvastatin (LIPITOR) 10 MG tablet Take 10 mg by mouth at bedtime.  0  . ibuprofen (ADVIL,MOTRIN) 200 MG tablet Take 400 mg by mouth daily as needed for mild pain.    Marland Kitchen lisinopril (PRINIVIL,ZESTRIL) 20 MG tablet Take 20 mg by mouth  2 (two) times daily.    . metFORMIN (GLUCOPHAGE) 500 MG tablet Take 500 mg by mouth 2 (two) times daily.  3  . polyethylene glycol powder (GLYCOLAX/MIRALAX) powder Mix 17 g with 4-8 ounces of juice and drink once a day  0  . sertraline (ZOLOFT) 100 MG tablet Take 2 tablets (200 mg total) by mouth every morning. 180 tablet 0  . busPIRone (BUSPAR) 5 MG tablet Take 1 tablet (5 mg total) by mouth 2 (two) times daily. 180 tablet 0   Current Facility-Administered Medications  Medication Dose Route Frequency Provider Last Rate Last Dose  . 0.9 %  sodium chloride infusion  500 mL Intravenous Continuous Danis, Kirke Corin, MD         Musculoskeletal: Reviewed Strength & Muscle Tone: within normal limits Gait & Station: broad based Patient leans: N/A  Psychiatric Specialty Exam: Review of Systems  Gastrointestinal: Positive for heartburn. Negative for nausea and vomiting.  Neurological: Positive for seizures and weakness. Negative for dizziness and headaches.    Blood pressure 108/69, pulse 88, height 5' 5.75" (1.67 m), weight 293 lb 12.8 oz (133.3 kg).Body mass index is 47.78 kg/m.  General Appearance: Casual  Eye Contact:  Good  Speech:  Clear and Coherent and Normal Rate  Volume:  Normal  Mood:  Depressed  Affect:  Congruent  Thought Process:  Coherent and Descriptions of Associations: Circumstantial  Orientation:  Full (Time, Place, and Person)  Thought Content:  Hallucinations: Auditory Visual and Rumination  Suicidal Thoughts:  No  Homicidal Thoughts:  No  Memory:  Immediate;   Poor  Judgement:  Intact  Insight:  Present  Psychomotor Activity:  Normal  Concentration:  Concentration:  Poor  Recall:  Poor  Fund of Knowledge:  Fair  Language:  Fair  Akathisia:  No  Handed:  Right  AIMS (if indicated):     Assets:  Desire for Improvement Housing Intimacy Social Support  ADL's:  Intact  Cognition:  WNL  Sleep:   poor     Screenings: GAD-7     Office Visit from 07/15/2016 in [facility] for [place]  Total GAD-7 Score  5    Mini-Mental     Office Visit from 04/14/2015 in [facility]  Total Score (max 30 points )  15    PHQ2-9     Office Visit from 07/15/2016 in [facility] for [facility]  PHQ-2 Total Score  6  PHQ-9 Total Score  19      I reviewed the information below on 01/05/2018 and have updated it Assessment and Plan: PTSD; MDD- recurrent, severe without psychotic features; Social anxiety disorder; Nicotine use disorder  Patient is reporting ongoing non-epileptic seizures, poor memory, insomnia.  She is reporting new onset auditory and visual hallucinations for the last 6 weeks.  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  At this point it is unclear to me if the hallucinations are related to depression or manifestations of her post ictal seizure state.  I recommended that the patient follow-up with her neurologist.   Medication management with supportive therapy. Risks and benefits, side effects and alternative treatment options discussed with patient. Pt was given an opportunity to ask questions about medication, illness, and treatment. All current psychiatric medications have been reviewed and discussed with the patient and adjusted as clinically appropriate. The patient has been provided  an accurate and updated list of the medications being now prescribed. Pt verbalized understanding and verbal consent obtained for treatment.  The risk of un-intended pregnancy is high based on the fact that pt reports that  she is not using any birth control and might be pre-menopausal. Pt is aware that these meds carry a teratogenic risk. Pt will discuss plan of action if she does or plans to become pregnant in the future.  Status of current problems: Ongoing  Meds: Zoloft 200mg  po qD for mood and PTSD Start trial of Buspar 5mg  po BID for anxiety and mood I will hold off on prescribing any sleep aid until she has started treatment for obstructive sleep apnea I will avoid treatment with Wellbutrin and hold off on treatment with mood stabilizers due to seizures   Labs: reviewed labs done July 26, 2017 BMP and CBC are within normal limits  Therapy: brief supportive therapy provided. Discussed psychosocial stressors in detail.    Consultations: Sleep study done in August 2019 shows obstructive sleep apnea with recommendation for CPAP I encouraged pt to follow up with neurology regarding seizures and OSA. I gave her the number and wrote instructions down Encouraged to follow up with PCP as needed  Pt's acute risk factors for suicide are low.  Pt denies SI and is at an acute low risk for suicide. Patient told to call clinic if any problems occur. Patient advised to go to ER if they should develop SI/HI, side effects, or if symptoms worsen. Pt has crisis numbers to call if needed. Pt acknowledged and agreed with plan and verbalized understanding.  F/up in 2 months or sooner if needed  The duration of this appointment visit was 30 minutes of face-to-face time with the patient.  Greater than 50% of this time was spent in counseling, explanation of  diagnosis, planning of further management, and coordination of care   Charlcie Cradle, MD 01/05/2018, 11:06 AM

## 2018-01-09 ENCOUNTER — Telehealth: Payer: Self-pay | Admitting: Neurology

## 2018-01-09 NOTE — Telephone Encounter (Signed)
Called and spoke with Pt, she said she needs a CPAP.  OK for [place] to order a sleep study?

## 2018-01-09 NOTE — Telephone Encounter (Signed)
I do not treat sleep disorders/sleep apnea.  She will need to go to her PCP/or establish care with PCP for further workup.  I also reviewed the psychiatrist's note and he mentioned that her hallucinations may be post-ictal state.  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  I do not think these are seizures and are likely psychiatric.  I will route this to her psychiatrist as well.

## 2018-01-09 NOTE — Telephone Encounter (Signed)
Patient called in regarding a Sleep machine that her Psychiatrist is wanting her to have. She was told to Follow up with Dr. Tomi Likens regarding this. Please Call. Thanks

## 2018-01-09 NOTE — Telephone Encounter (Signed)
Called and advised Pt 

## 2018-03-16 ENCOUNTER — Ambulatory Visit (INDEPENDENT_AMBULATORY_CARE_PROVIDER_SITE_OTHER): Payer: Medicaid Other | Admitting: Psychiatry

## 2018-03-16 ENCOUNTER — Encounter (HOSPITAL_COMMUNITY): Payer: Self-pay | Admitting: Psychiatry

## 2018-03-16 VITALS — BP 124/79 | HR 89 | Ht 65.5 in | Wt 289.0 lb

## 2018-03-16 DIAGNOSIS — Z79899 Other long term (current) drug therapy: Secondary | ICD-10-CM | POA: Diagnosis not present

## 2018-03-16 DIAGNOSIS — F4312 Post-traumatic stress disorder, chronic: Secondary | ICD-10-CM | POA: Diagnosis not present

## 2018-03-16 DIAGNOSIS — F333 Major depressive disorder, recurrent, severe with psychotic symptoms: Secondary | ICD-10-CM | POA: Diagnosis not present

## 2018-03-16 MED ORDER — SERTRALINE HCL 100 MG PO TABS: 200.0000 mg | ORAL_TABLET | ORAL | 0 refills | Status: DC

## 2018-03-16 MED ORDER — BUSPIRONE HCL 10 MG PO TABS: 10.0000 mg | ORAL_TABLET | Freq: Two times a day (BID) | ORAL | 0 refills | Status: DC

## 2018-03-16 MED ORDER — ARIPIPRAZOLE 2 MG PO TABS: 2.0000 mg | ORAL_TABLET | Freq: Every day | ORAL | 0 refills | Status: DC

## 2018-03-16 NOTE — Progress Notes (Signed)
BH MD/PA/NP OP Progress Note  03/16/2018 10:16 AM Denise [name]  MRN:  [id number]  Chief Complaint:  Chief Complaint    Post-Traumatic Stress Disorder; Depression; Anxiety; Follow-up     HPI: Patient is here with her husband.  Husband reports that patient has extremely poor memory, insomnia and is having frequent seizures throughout the weeks.  He is very worried about her.  Patient does not recall that at our last visit I gave her information on setting up appointments with the sleep clinic and neurology.  She has not gotten a CPAP or BiPAP machine.  Husband reports that her sleep is erratic.  He is always worried about her falling.  As a result he has been taking a lot of time off work.  Patient and husband report that patient is having auditory and visual hallucinations.  Patient reports that she is depressed and does not know what to do about it.  Husband states that the patient is not interested in anything and has very low motivation.  She is denying SI/HI.  Visit Diagnosis:    ICD-10-CM   1. Encounter for long-term (current) use of medications Z79.899 EKG 12-Lead  2. Chronic post-traumatic stress disorder (PTSD) F43.12 busPIRone (BUSPAR) 10 MG tablet    sertraline (ZOLOFT) 100 MG tablet  3. Severe episode of recurrent major depressive disorder, with psychotic features ([place]) F33.3 busPIRone (BUSPAR) 10 MG tablet    sertraline (ZOLOFT) 100 MG tablet    ARIPiprazole (ABILIFY) 2 MG tablet       Past Psychiatric History: Patient denies any history of inpatient psychiatric treatment.  She had an outpatient psychiatrist but did not like the treatment treatment.  She tried a few different medications in the past- Ambien, Prozac, Prazosin, Remeron.  Patient denies any history of suicide attempts  Past Medical History:  Past Medical History:  Diagnosis Date  . Abnormal EKG   . AD (atopic dermatitis)   . Alcohol use 04/14/2015  . Anxiety   . Blood transfusion without reported  diagnosis   . Depression   . Fibroid uterus 12/18/2014  . Hyperlipemia   . Hypertension   . Memory changes   . Overactive bladder   . Pelvic pain in female 12/18/2014  . Prediabetes   . Right sided weakness   . Seizures ([place])   . Stroke ([place])   . Substance abuse ([place])   . Syncope 02/07/2015  . TIA (transient ischemic attack)   . Tobacco use 04/14/2015    Past Surgical History:  Procedure Laterality Date  . CESAREAN SECTION     x 2    Family Psychiatric and Medical History:  Family History  Problem Relation Age of Onset  . Stroke Mother   . Anxiety disorder Mother   . Drug abuse Mother   . Alcohol abuse Mother   . Depression Mother   . Heart disease Maternal Grandmother   . Drug abuse Father   . Anxiety disorder Father   . Colon cancer Neg Hx   . Esophageal cancer Neg Hx   . Pancreatic cancer Neg Hx   . Stomach cancer Neg Hx   . Liver disease Neg Hx     Social History:  Social History   Socioeconomic History  . Marital status: Married    Spouse name: Journalist, newspaper  . Number of children: 3  . Years of education: Not on file  . Highest education level: 6th grade  Occupational History  . Not on file  Social Needs  .  Financial resource strain: Very hard  . Food insecurity:    Worry: Often true    Inability: Often true  . Transportation needs:    Medical: Yes    Non-medical: Yes  Tobacco Use  . Smoking status: Current Every Day Smoker    Packs/day: 0.50    Types: Cigarettes  . Smokeless tobacco: Never Used  Substance and Sexual Activity  . Alcohol use: Not Currently    Alcohol/week: 0.0 standard drinks    Comment: 2 a day  . Drug use: No    Types: "Crack" cocaine    Comment: JANUARY 2017  . Sexual activity: Yes    Birth control/protection: None  Lifestyle  . Physical activity:    Days per week: 0 days    Minutes per session: 0 min  . Stress: Very much  Relationships  . Social connections:    Talks on phone: Not on file    Gets together: Not on file     Attends religious service: Never    Active member of club or organization: No    Attends meetings of clubs or organizations: Never    Relationship status: Married  Other Topics Concern  . Not on file  Social History Narrative  . Not on file    Allergies:  Allergies  Allergen Reactions  . Grapefruit Flavor [Flavoring Agent] Other (See Comments)    Drug interaction    Metabolic Disorder Labs: Lab Results  Component Value Date   HGBA1C 5.8 (H) 02/07/2015   MPG 120 02/07/2015   No results found for: PROLACTIN Lab Results  Component Value Date   CHOL 262 (H) 02/08/2015   TRIG 352 (H) 02/08/2015   HDL 35 (L) 02/08/2015   CHOLHDL 7.5 02/08/2015   VLDL 70 (H) 02/08/2015   LDLCALC 157 (H) 02/08/2015   Lab Results  Component Value Date   TSH 2.587 02/07/2015    Therapeutic Level Labs: No results found for: LITHIUM No results found for: VALPROATE No components found for:  CBMZ  Current Medications: Current Outpatient Medications  Medication Sig Dispense Refill  . amLODipine (NORVASC) 10 MG tablet Take 10 mg by mouth daily.    Marland Kitchen atorvastatin (LIPITOR) 10 MG tablet Take 10 mg by mouth at bedtime.  0  . busPIRone (BUSPAR) 10 MG tablet Take 1 tablet (10 mg total) by mouth 2 (two) times daily. 180 tablet 0  . ibuprofen (ADVIL,MOTRIN) 200 MG tablet Take 400 mg by mouth daily as needed for mild pain.    Marland Kitchen lisinopril (PRINIVIL,ZESTRIL) 20 MG tablet Take 20 mg by mouth 2 (two) times daily.    . metFORMIN (GLUCOPHAGE) 500 MG tablet Take 500 mg by mouth 2 (two) times daily.  3  . polyethylene glycol powder (GLYCOLAX/MIRALAX) powder Mix 17 g with 4-8 ounces of juice and drink once a day  0  . sertraline (ZOLOFT) 100 MG tablet Take 2 tablets (200 mg total) by mouth every morning. 180 tablet 0  . ARIPiprazole (ABILIFY) 2 MG tablet Take 1 tablet (2 mg total) by mouth daily. 90 tablet 0   Current Facility-Administered Medications  Medication Dose Route Frequency Provider Last Rate  Last Dose  . 0.9 %  sodium chloride infusion  500 mL Intravenous Continuous Danis, Kirke Corin, MD         Musculoskeletal: Reviewed Strength & Muscle Tone: within normal limits Gait & Station: broad based Patient leans: N/A  Psychiatric Specialty Exam: Review of Systems  Musculoskeletal: Positive for back pain. Negative  for joint pain, myalgias and neck pain.  Neurological: Positive for tingling and seizures. Negative for speech change and headaches.    Blood pressure 124/79, pulse 89, height 5' 5.5" (1.664 m), weight 289 lb (131.1 kg).Body mass index is 47.36 kg/m.  General Appearance: Casual  Eye Contact:  Good  Speech:  Slurred  Volume:  Normal  Mood:  Anxious and Depressed  Affect:  Congruent  Thought Process:  Disorganized and Descriptions of Associations: Loose  Orientation:  Full (Time, Place, and Person)  Thought Content:  Hallucinations: Auditory Visual  Suicidal Thoughts:  No  Homicidal Thoughts:  No  Memory:  Immediate;   Poor  Judgement:  Impaired  Insight:  Shallow  Psychomotor Activity:  Normal  Concentration:  Concentration: Poor  Recall:  Poor  Fund of Knowledge:  Poor  Language:  Fair  Akathisia:  No  Handed:  Right  AIMS (if indicated):     Assets:  Desire for Improvement Financial Resources/Insurance Housing Intimacy Social Support  ADL's:  Intact  Cognition:  WNL  Sleep:   poor       Screenings: GAD-7     Office Visit from 07/15/2016 in [place] for [place]  Total GAD-7 Score  5    Mini-Mental     Office Visit from 04/14/2015 in [facility] [place]  Total Score (max 30 points )  15    PHQ2-9     Office Visit from 07/15/2016 in [place] for [place]  PHQ-2 Total Score  6  PHQ-9 Total Score  19      I reviewed the information below on 03/16/2018 and have updated it Assessment and Plan: PTSD; MDD- recurrent, severe with psychotic features; Social anxiety disorder; Nicotine use disorder  Patient  is reporting ongoing non-epileptic seizures, poor memory, insomnia.  She is reporting new onset auditory and visual hallucinations for the last 2 to 3 months.  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  At this point it is unclear to me if the hallucinations are related to depression or manifestations of her post ictal seizure state.  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  Due to the diagnosis the neurologist stated that antiseizure medication was not indicated.   Medication management with supportive therapy. Risks and benefits, side effects and alternative treatment options discussed with patient. Pt was given an opportunity to ask questions about medication, illness, and treatment. All current psychiatric medications have been reviewed and discussed with the patient and adjusted as clinically appropriate. The patient has been provided an accurate and updated list of the medications being now prescribed. Pt verbalized understanding and verbal consent obtained for treatment.  The risk of un-intended pregnancy is high based on the fact that pt reports that she is not using any birth control and might be pre-menopausal. Pt is aware that these meds carry a teratogenic risk. Pt will discuss plan of action if she does or plans to become pregnant in the future.  Status of current problems: Unchanged  Meds: Zoloft 200mg  po qD for mood and PTSD Increase Buspar 10mg  po BID for anxiety and mood Start Abilify 2mg  po qHS for psychosis and mood augmentation I will hold off on prescribing any sleep aid until  she has started treatment for obstructive sleep apnea I will avoid treatment with Wellbutrin and hold off on treatment with mood  stabilizers due to non-epileptic seizures   Labs: reviewed labs done July 26, 2017 BMP and CBC are within normal limits I reviewed the EKG done on 07/26/2017 which showed a QTC of 489.  I have ordered an EKG-it has been scheduled at [facility] for 227 at 10 AM   Therapy: brief supportive therapy provided. Discussed psychosocial stressors in detail.    Consultations: Sleep study done in August 2019 shows obstructive sleep apnea with recommendation for CPAP I encouraged pt to follow up with neurology regarding seizures and OSA. Pt did not remember getting any information or phone numbers from our last visit.  Today we helped her by scheduling an appointment for an EKG in 3 to 4 weeks and scheduling an appointment with her neurologist at [facility] neurology (per epic last visit was in March 2019).  I am concerned about her memory, ongoing seizures, and untreated sleep apnea.  We are also requesting labs from her primary care doctor Appointment with neurology is 07/21/2018 at 10 AM.  She has been added to the wait list.  Encouraged to follow up with PCP as needed  Pt's acute risk factors for suicide are low.  Pt denies SI and is at an acute low risk for suicide. Patient told to call clinic if any problems occur. Patient advised to go to ER if they should develop SI/HI, side effects, or if symptoms worsen. Pt has crisis numbers to call if needed. Pt acknowledged and agreed with plan and verbalized understanding.  F/up in 2 months or sooner if needed  The duration of this appointment visit was 30 minutes of face-to-face time with the patient.  Greater than 50% of this time was spent in counseling, explanation of  diagnosis, planning of further management, and coordination of care   Charlcie Cradle, MD 03/16/2018, 10:16 AM

## 2018-03-23 ENCOUNTER — Other Ambulatory Visit (HOSPITAL_COMMUNITY): Payer: Self-pay

## 2018-04-03 ENCOUNTER — Ambulatory Visit: Payer: Self-pay | Admitting: Neurology

## 2018-04-27 ENCOUNTER — Ambulatory Visit (INDEPENDENT_AMBULATORY_CARE_PROVIDER_SITE_OTHER): Payer: Medicaid Other | Admitting: Psychiatry

## 2018-04-27 ENCOUNTER — Other Ambulatory Visit: Payer: Self-pay

## 2018-04-27 ENCOUNTER — Encounter (HOSPITAL_COMMUNITY): Payer: Self-pay | Admitting: Psychiatry

## 2018-04-27 DIAGNOSIS — F4312 Post-traumatic stress disorder, chronic: Secondary | ICD-10-CM

## 2018-04-27 DIAGNOSIS — F4010 Social phobia, unspecified: Secondary | ICD-10-CM

## 2018-04-27 DIAGNOSIS — F333 Major depressive disorder, recurrent, severe with psychotic symptoms: Secondary | ICD-10-CM

## 2018-04-27 MED ORDER — SERTRALINE HCL 100 MG PO TABS: 200.0000 mg | ORAL_TABLET | ORAL | 0 refills | Status: DC

## 2018-04-27 MED ORDER — BUSPIRONE HCL 15 MG PO TABS: 15.0000 mg | ORAL_TABLET | Freq: Two times a day (BID) | ORAL | 0 refills | Status: DC

## 2018-04-27 NOTE — Progress Notes (Signed)
Virtual Visit via Telephone Note  I connected with Sonda Humphrey-Headen on 04/27/18 at  8:00 AM EDT by telephone and verified that I am speaking with the correct person using two identifiers.   I discussed the limitations, risks, security and privacy concerns of performing an evaluation and management service by telephone and the availability of in person appointments. I also discussed with the patient that there may be a patient responsible charge related to this service. The patient expressed understanding and agreed to proceed.   History of Present Illness: Damonie tells me that she has not noticed any change in her mood or anxiety or hallucinations since her last visit.  She states that her anxiety is constant.  She is unable to stop her racing thoughts.  She worries about everything and recognizes that often it is about things that she cannot control.  She states that she is very depressed and it is frustrating.  Part of it is because of her health and she does not know what else could be causing it.  She is reporting the desire to isolate and anhedonia.  She spends most of her day watching TV and really has no motivation to do anything else.  Sharie tells me her sleep is poor.  For the last month whenever she lays down she gets a very severe headache and wants to hit her head with a hammer to stop the pain.  She states that this is not true suicidal ideations but more of a desire to stop the pain.  It lasts for a while and as a result it takes time for her to fall asleep.  Her sleep quality is not good as she often has nightmares and her husband reports that she has seizures during her sleep several times a week.  She is denying any homicidal ideations.  Benetta continues to experience auditory and visual hallucinations.  She knows that her memory is poor and her ability to comprehend a lot of material is also very poor. I spoke with her husband who reported that patient has been having seizures  at least 3 times a week sometimes multiple times during the day.  It can be when she wakes up or in the afternoon or even when she is sleeping.  She does have poor concentration and poor memory.  He does believe that it is related to her stroke.  When asked if he recalls any medication that has been effective in controlling her mood he tells me that nothing has really worked.   Observations/Objective: Spoke with Reva on the phone.  Her speech was somewhat slurred and the rate was pushed.  Volume was normal.  Speech was spontaneous and of increased latency.  Her mood is anxious and depressed and her affect is congruent.  Her thought processes are loose at times but overall goal directed.  Her thought content is with paranoia and hallucinations.  She did not appear to be responding to any internal stimuli during our conversation.  Her memory and concentration are poor.  Her fund of knowledge and language use are on the poor side.  Her insight is shallow and her judgment is impaired.  I am unable to comment on her general appearance, hygiene, eye contact or psychomotor activity as I was unable to physically see the patient.  Assessment and Plan: MDD-recurrent, severe with psychotic features; PTSD; social anxiety disorder; nicotine use disorder  Patient and husband report ongoing nonepileptic seizures, poor memory, insomnia and depression.  She was told  that she did not need to follow-up with her neurologist when she tried to schedule an appointment.  Patient has not yet scheduled a sleep apnea study and she missed her EKG appointment due to a seizure.  The EKG appointment has not yet been rescheduled either.  Daley does have a history of a right hemisphere stroke.  It is possible that her mood, memory and hallucinations are related to the stroke or her nonepileptic seizures.  Overall both husband and patient report no change in symptoms. I will increase the BuSpar to 15 mg p.o. twice daily for the  anxiety and mood.   I will continue Zoloft 200 mg p.o. daily for mood and PTSD.   Continue Abilify 2 mg p.o. nightly for MDD and psychosis. I will avoid treatment with Wellbutrin and hold off on treatment with mood stabilizers per the documented recommendations by her neurologist. Since the patient has not yet had her EKG I will hold off on making any further changes to her antipsychotics. I am avoiding using any sleep aids until she has had a sleep apnea evaluation.  Follow Up Instructions: In 2 months or sooner if needed    I discussed the assessment and treatment plan with the patient. The patient was provided an opportunity to ask questions and all were answered. The patient agreed with the plan and demonstrated an understanding of the instructions.   The patient was advised to call back or seek an in-person evaluation if the symptoms worsen or if the condition fails to improve as anticipated.  I provided 25 minutes of non-face-to-face time during this encounter.   Charlcie Cradle, MD

## 2018-06-05 ENCOUNTER — Telehealth (HOSPITAL_COMMUNITY): Payer: Self-pay

## 2018-06-05 NOTE — Telephone Encounter (Signed)
Patient called regarding her medication. She stated that since she started taking the new medication  Metformin 500 mg  that was prescribed to her on 05/26/18, she has been getting sick and experiencing dizziness. She also stated that her head has been hurting since starting it and she has been experiencing constipation. She has an appointment scheduled for 06/29/18. Please review and advise. Thank you.

## 2018-06-08 NOTE — Telephone Encounter (Signed)
I did not order Metformin. I would recommend she check her BP and blood sugars. She should talk with her PCP.

## 2018-06-12 NOTE — Telephone Encounter (Signed)
Thank you Dr. Doyne Keel. Called patient to relay message to her. She will contact her primary care physician regarding her metformin and to have her bp and blood sugars checked.

## 2018-06-20 ENCOUNTER — Other Ambulatory Visit (HOSPITAL_COMMUNITY): Payer: Self-pay

## 2018-06-20 DIAGNOSIS — F333 Major depressive disorder, recurrent, severe with psychotic symptoms: Secondary | ICD-10-CM

## 2018-06-20 MED ORDER — ARIPIPRAZOLE 2 MG PO TABS: 2.0000 mg | ORAL_TABLET | Freq: Every day | ORAL | 0 refills | Status: DC

## 2018-06-29 ENCOUNTER — Ambulatory Visit (INDEPENDENT_AMBULATORY_CARE_PROVIDER_SITE_OTHER): Payer: Self-pay | Admitting: Psychiatry

## 2018-06-29 ENCOUNTER — Encounter (HOSPITAL_COMMUNITY): Payer: Self-pay | Admitting: Psychiatry

## 2018-06-29 ENCOUNTER — Other Ambulatory Visit: Payer: Self-pay

## 2018-06-29 DIAGNOSIS — F333 Major depressive disorder, recurrent, severe with psychotic symptoms: Secondary | ICD-10-CM

## 2018-06-29 DIAGNOSIS — F17210 Nicotine dependence, cigarettes, uncomplicated: Secondary | ICD-10-CM

## 2018-06-29 DIAGNOSIS — F4312 Post-traumatic stress disorder, chronic: Secondary | ICD-10-CM

## 2018-06-29 MED ORDER — BUSPIRONE HCL 15 MG PO TABS: 15.0000 mg | ORAL_TABLET | Freq: Two times a day (BID) | ORAL | 0 refills | Status: DC

## 2018-06-29 MED ORDER — NICOTINE POLACRILEX 4 MG MT GUM: 4.0000 mg | CHEWING_GUM | OROMUCOSAL | 0 refills | Status: DC | PRN

## 2018-06-29 MED ORDER — ARIPIPRAZOLE 5 MG PO TABS: 5.0000 mg | ORAL_TABLET | Freq: Every day | ORAL | 1 refills | Status: DC

## 2018-06-29 MED ORDER — SERTRALINE HCL 100 MG PO TABS: 200.0000 mg | ORAL_TABLET | ORAL | 0 refills | Status: DC

## 2018-06-29 NOTE — Progress Notes (Signed)
Virtual Visit via Telephone Note  I connected with Denise [name] on 06/29/18 at 10:30 AM EDT by telephone and verified that I am speaking with the correct person using two identifiers.  Location: Patient: Home Provider: Office   I discussed the limitations, risks, security and privacy concerns of performing an evaluation and management service by telephone and the availability of in person appointments. I also discussed with the patient that there may be a patient responsible charge related to this service. The patient expressed understanding and agreed to proceed.   History of Present Illness: "NOT, not, not all the way right". Pt states her husband tells her that she is hyper when she does things. This means that she finishes tasks very fast. Pt has been feeling very depressed and with low energy. She has not been talking with her husband as much. Denise [name] wants to cry. She is doing her chores. Pt is having nightmares about hurting people and her sleep is disturbed. She is waking up multiple times a night. Pt denies SI/HI. Pt continues to experience AVH and she is responding back to them. Pt denies any recent seizures. Denise [name]'s anxiety is worsening. She has been taking her meds and is wants helps. She is smoking 5 cigs/day and wants to stop.    Observations/Objective: Denise [name] is pleasant and cooperative on the phone today.  She is engaged in the conversation.  She has an increased rate with a high pitch when speaking.  Volume was normal.  Mood is depressed and affect is congruent.  Thought processes were concrete and and coherent.  There was some delay in processing.  Thought processes were with ruminations.  Patient is reporting auditory and visual hallucinations but did not appear to be responding to internal stimuli at the time.  She denies SI/HI.  Memory and concentration are poor.  Insight is fair, judgment is fair.  Use of language and fund of knowledge are average.  I am unable to  comment on eye contact, hygiene or psychomotor activity as I was unable to physically see the patient.  Assessment and Plan: MDD-recurrent, severe with psychotic features; PTSD; social anxiety disorder; nicotine use disorder  Status of current symptoms- worsening depression  Increase Abilify 5mg  po qD for MDD and psychosis Start nicotine gum 4mg  as needed for nicotine cravings I instructed patient on proper use Continue BuSpar 15 mg p.o. twice daily for anxiety and adjunct treatment for MDD Continue Zoloft 200 mg p.o. daily for MDD   Follow Up Instructions: In 6 weeks or sooner if needed   I discussed the assessment and treatment plan with the patient. The patient was provided an opportunity to ask questions and all were answered. The patient agreed with the plan and demonstrated an understanding of the instructions.   The patient was advised to call back or seek an in-person evaluation if the symptoms worsen or if the condition fails to improve as anticipated.  I provided 20 minutes of non-face-to-face time during this encounter.   Charlcie Cradle, MD

## 2018-07-21 ENCOUNTER — Ambulatory Visit: Payer: Self-pay | Admitting: Neurology

## 2018-08-17 ENCOUNTER — Ambulatory Visit (INDEPENDENT_AMBULATORY_CARE_PROVIDER_SITE_OTHER): Payer: Medicaid Other | Admitting: Psychiatry

## 2018-08-17 ENCOUNTER — Other Ambulatory Visit: Payer: Self-pay

## 2018-08-17 ENCOUNTER — Encounter (HOSPITAL_COMMUNITY): Payer: Self-pay | Admitting: Psychiatry

## 2018-08-17 DIAGNOSIS — G4701 Insomnia due to medical condition: Secondary | ICD-10-CM | POA: Diagnosis not present

## 2018-08-17 DIAGNOSIS — F4312 Post-traumatic stress disorder, chronic: Secondary | ICD-10-CM

## 2018-08-17 DIAGNOSIS — F333 Major depressive disorder, recurrent, severe with psychotic symptoms: Secondary | ICD-10-CM

## 2018-08-17 MED ORDER — LURASIDONE HCL 20 MG PO TABS: 20.0000 mg | ORAL_TABLET | Freq: Every day | ORAL | 1 refills | Status: DC

## 2018-08-17 MED ORDER — BUSPIRONE HCL 15 MG PO TABS: 15.0000 mg | ORAL_TABLET | Freq: Two times a day (BID) | ORAL | 0 refills | Status: DC

## 2018-08-17 MED ORDER — SERTRALINE HCL 100 MG PO TABS: 200.0000 mg | ORAL_TABLET | ORAL | 0 refills | Status: DC

## 2018-08-17 NOTE — Progress Notes (Signed)
Virtual Visit via Telephone Note  I connected with Denise [name] on 08/17/18 at  1:00 PM EDT by telephone and verified that I am speaking with the correct person using two identifiers.  Location: Patient: home Provider: office   I discussed the limitations, risks, security and privacy concerns of performing an evaluation and management service by telephone and the availability of in person appointments. I also discussed with the patient that there may be a patient responsible charge related to this service. The patient expressed understanding and agreed to proceed.   History of Present Illness: I called the patient and there was no answer.  I called her back after 10 minutes and we continued with our scheduled session. Pt reports she is not doing well. The depression is worse. She is always sad, wanting to cry but can't, unmotivated, poor appetite, poor sleep and energy and  Isolating. She has not received her CPAP machine yet. Pt denies SI/HI. When I asked about PTSD she stated "everything is just messed up. There is nothing to say anymore". She spends her day playing on the phone. Pt states "I don't really like the way I feel at all. At all". Increase Abilify did not change AVH. She continues to smoke despite use of gum and lozenges. Pt hasn't gone to her PCP in a long time.    Observations/Objective: I spoke with Denise [name] on the phone.  Pt was calm, pleasant and cooperative.  Pt was engaged in the conversation and answered questions appropriately.  Speech was clear and coherent with normal rate, tone and volume.  Mood is depressed and anxious, affect is congruent. Thought processes are coherent, goal oriented and intact.  Thought content is with AVH but did not appear to be responding to internal stimuli.  Pt denies SI/HI.     Memory and concentration are fair.  Fund of knowledge and use of language are average.  Insight and judgment are fair.  I am unable to comment on  psychomotor activity, general appearance, hygiene, or eye contact as I was unable to physically see the patient on the phone.  Vital signs not available since interview conducted virtually.    Assessment and Plan: MDD- recurrent, severe psychotic features; PTSD; social anxiety d/o; nicotine use d/o  D/c Abilify Start Latuda 20 mg p.o. daily  BuSpar 15 mg p.o. twice daily  Zoloft 200 mg p.o. daily  Discussed smoking cessation and proper use of nicotine gum and lozenges  I will order labs at next visit.  Patient reports that she has not seen her PCP in a long while.   Follow Up Instructions: In 2 months or sooner if needed   I discussed the assessment and treatment plan with the patient. The patient was provided an opportunity to ask questions and all were answered. The patient agreed with the plan and demonstrated an understanding of the instructions.   The patient was advised to call back or seek an in-person evaluation if the symptoms worsen or if the condition fails to improve as anticipated.  I provided 15 minutes of non-face-to-face time during this encounter.   Charlcie Cradle, MD

## 2018-08-28 ENCOUNTER — Telehealth (HOSPITAL_COMMUNITY): Payer: Self-pay

## 2018-08-28 NOTE — Telephone Encounter (Signed)
[place] [employer] PRES[employer] COVERAGE APPROVED LATUDA 20MG  TABLETS PA #[id number] EFFECTIVE 08/28/2018 TO 08/23/2019

## 2018-10-05 ENCOUNTER — Ambulatory Visit (INDEPENDENT_AMBULATORY_CARE_PROVIDER_SITE_OTHER): Payer: Medicaid Other | Admitting: Psychiatry

## 2018-10-05 ENCOUNTER — Other Ambulatory Visit: Payer: Self-pay

## 2018-10-05 ENCOUNTER — Encounter (HOSPITAL_COMMUNITY): Payer: Self-pay | Admitting: Psychiatry

## 2018-10-05 DIAGNOSIS — F4312 Post-traumatic stress disorder, chronic: Secondary | ICD-10-CM

## 2018-10-05 DIAGNOSIS — F333 Major depressive disorder, recurrent, severe with psychotic symptoms: Secondary | ICD-10-CM

## 2018-10-05 MED ORDER — BUSPIRONE HCL 15 MG PO TABS: 15.0000 mg | ORAL_TABLET | Freq: Two times a day (BID) | ORAL | 0 refills | Status: DC

## 2018-10-05 MED ORDER — SERTRALINE HCL 100 MG PO TABS: 200.0000 mg | ORAL_TABLET | ORAL | 0 refills | Status: DC

## 2018-10-05 MED ORDER — NICOTINE 14 MG/24HR TD PT24: 14.0000 mg | MEDICATED_PATCH | Freq: Every day | TRANSDERMAL | 1 refills | Status: DC

## 2018-10-05 MED ORDER — LURASIDONE HCL 20 MG PO TABS: 20.0000 mg | ORAL_TABLET | Freq: Every day | ORAL | 0 refills | Status: DC

## 2018-10-05 NOTE — Progress Notes (Signed)
  Virtual Visit via Telephone Note  I connected with Cylah Humphrey-Headen on 10/05/18 at  4:30 PM EDT by telephone and verified that I am speaking with the correct person using two identifiers.  Location: Patient: home Provider: office   I discussed the limitations, risks, security and privacy concerns of performing an evaluation and management service by telephone and the availability of in person appointments. I also discussed with the patient that there may be a patient responsible charge related to this service. The patient expressed understanding and agreed to proceed.   History of Present Illness: "I'm ok. Just been tired lately". Pt ran out [place] a while ago but can't remember when. It was effective. She felt calmer and more focused when she was taking it. Cason notes that it was helping to decrease AVH. Now she is having increase in anxiety and is smoking more. She is not sure how much and is no longer using any gum or lozenges. Her depression is worse. "it's unbearable". She spends a lot of time sleeping and is unmotivated to do anything. Kylieann is not sleeping well at night due to PTSD. 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  She reports a sensation of someone being in her head and poking inside her head with a hot poker. It happens frequently. She has been having thoughts of SIB, like bunring her fingers, but has not engaged in it. She has no hx of SIB. She denies SI/HI. Gurneet sometimes goes the store and walks to the door if she moves about at all.     Observations/Objective: I spoke with [name] on the phone.  Pt was calm, pleasant and cooperative.  Pt was engaged in the conversation and answered questions appropriately.  Speech was clear and coherent with slow rate, normal tone and volume.  Mood is depressed and anxious, affect is congruent. Thought processes are coherent and concrete.  Thought content is with ruminations.  Pt denies SI/HI.   Pt reports auditory and visual  hallucinations and did not appear to be responding to internal stimuli.  Memory and concentration are poor.  Fund of knowledge and use of language are average.  Insight and judgment are fair.  I am unable to comment on psychomotor activity, general appearance, hygiene, or eye contact as I was unable to physically see the patient on the phone.  Vital signs not available since interview conducted virtually.     Assessment and Plan: MDD-recurrent, severe with psychotic features; PTSD; social anxiety disorder; nicotine use disorder  Status of current symptoms: worsening depression and anxiety  Restart Latuda 20mg  po qD  Buspar 15mg  po BID  Zoloft 200mg  po qD  Discussed smoking cessation but pt doesn't want any gum lozenges. She is willing to try the Nicotine patches. Start Nicotine patch 14mg  qD  Labs: ordered CBC, CMP, HbA1c, Lipid panel, TSH, Prolactin level    Follow Up Instructions: In 8 weeks or sooner if needed   I discussed the assessment and treatment plan with the patient. The patient was provided an opportunity to ask questions and all were answered. The patient agreed with the plan and demonstrated an understanding of the instructions.   The patient was advised to call back or seek an in-person evaluation if the symptoms worsen or if the condition fails to improve as anticipated.  I provided 25 minutes  of non-face-to-face time during this encounter.   Charlcie Cradle, MD

## 2018-10-30 ENCOUNTER — Other Ambulatory Visit: Payer: Self-pay

## 2018-10-30 ENCOUNTER — Other Ambulatory Visit (HOSPITAL_COMMUNITY): Payer: Self-pay

## 2018-10-30 ENCOUNTER — Ambulatory Visit (HOSPITAL_COMMUNITY): Payer: Medicaid Other

## 2018-10-30 DIAGNOSIS — Z79899 Other long term (current) drug therapy: Secondary | ICD-10-CM

## 2018-10-30 DIAGNOSIS — F333 Major depressive disorder, recurrent, severe with psychotic symptoms: Secondary | ICD-10-CM

## 2018-10-30 NOTE — Progress Notes (Signed)
Patient came in today for labs ordered by Dr. Doyne Keel. Patient tolerated well and without complaint. Labs sent out today

## 2018-10-31 LAB — TSH: TSH: 2.16 u[IU]/mL (ref 0.450–4.500)

## 2018-10-31 LAB — LIPID PANEL WITH LDL/HDL RATIO
Cholesterol, Total: 178 mg/dL (ref 100–199)
HDL: 43 mg/dL (ref >39)
LDL Chol Calc (NIH): 116 mg/dL — ABNORMAL HIGH (ref 0–99)
LDL/HDL Ratio: 2.7 ratio (ref 0.0–3.2)
Triglycerides: 103 mg/dL (ref 0–149)
VLDL Cholesterol Cal: 19 mg/dL (ref 5–40)

## 2018-10-31 LAB — COMPREHENSIVE METABOLIC PANEL
ALT: 24 IU/L (ref 0–32)
AST: 65 IU/L — ABNORMAL HIGH (ref 0–40)
Albumin/Globulin Ratio: 2 (ref 1.2–2.2)
Albumin: 4.3 g/dL (ref 3.8–4.8)
Alkaline Phosphatase: 107 IU/L (ref 39–117)
BUN/Creatinine Ratio: 10 (ref 9–23)
BUN: 8 mg/dL (ref 6–24)
Bilirubin Total: <0.2 mg/dL (ref 0.0–1.2)
CO2: 22 mmol/L (ref 20–29)
Calcium: 9.6 mg/dL (ref 8.7–10.2)
Chloride: 102 mmol/L (ref 96–106)
Creatinine, Ser: 0.8 mg/dL (ref 0.57–1.00)
GFR calc Af Amer: 99 mL/min/{1.73_m2} (ref >59)
GFR calc non Af Amer: 86 mL/min/{1.73_m2} (ref >59)
Globulin, Total: 2.2 g/dL (ref 1.5–4.5)
Glucose: 107 mg/dL — ABNORMAL HIGH (ref 65–99)
Potassium: 4.1 mmol/L (ref 3.5–5.2)
Sodium: 139 mmol/L (ref 134–144)
Total Protein: 6.5 g/dL (ref 6.0–8.5)

## 2018-10-31 LAB — CBC WITH DIFFERENTIAL/PLATELET
Basophils Absolute: 0 10*3/uL (ref 0.0–0.2)
Basos: 0 %
EOS (ABSOLUTE): 0.1 10*3/uL (ref 0.0–0.4)
Eos: 2 %
Hematocrit: 41.7 % (ref 34.0–46.6)
Hemoglobin: 13.3 g/dL (ref 11.1–15.9)
Immature Grans (Abs): 0 10*3/uL (ref 0.0–0.1)
Immature Granulocytes: 0 %
Lymphocytes Absolute: 2.5 10*3/uL (ref 0.7–3.1)
Lymphs: 27 %
MCH: 27.3 pg (ref 26.6–33.0)
MCHC: 31.9 g/dL (ref 31.5–35.7)
MCV: 86 fL (ref 79–97)
Monocytes Absolute: 0.5 10*3/uL (ref 0.1–0.9)
Monocytes: 6 %
Neutrophils Absolute: 6.1 10*3/uL (ref 1.4–7.0)
Neutrophils: 65 %
Platelets: 350 10*3/uL (ref 150–450)
RBC: 4.87 x10E6/uL (ref 3.77–5.28)
RDW: 15.9 % — ABNORMAL HIGH (ref 11.7–15.4)
WBC: 9.3 10*3/uL (ref 3.4–10.8)

## 2018-10-31 LAB — HEMOGLOBIN A1C
Est. average glucose Bld gHb Est-mCnc: 131 mg/dL
Hgb A1c MFr Bld: 6.2 % — ABNORMAL HIGH (ref 4.8–5.6)

## 2018-10-31 LAB — PROLACTIN: Prolactin: 8.1 ng/mL (ref 4.8–23.3)

## 2018-11-16 ENCOUNTER — Other Ambulatory Visit: Payer: Self-pay

## 2018-11-16 ENCOUNTER — Encounter (HOSPITAL_COMMUNITY): Payer: Self-pay | Admitting: Psychiatry

## 2018-11-16 ENCOUNTER — Ambulatory Visit (INDEPENDENT_AMBULATORY_CARE_PROVIDER_SITE_OTHER): Payer: Medicaid Other | Admitting: Psychiatry

## 2018-11-16 DIAGNOSIS — F4312 Post-traumatic stress disorder, chronic: Secondary | ICD-10-CM

## 2018-11-16 DIAGNOSIS — F333 Major depressive disorder, recurrent, severe with psychotic symptoms: Secondary | ICD-10-CM

## 2018-11-16 MED ORDER — REXULTI 0.5 MG PO TABS: ORAL_TABLET | ORAL | 0 refills | Status: DC

## 2018-11-16 MED ORDER — BUSPIRONE HCL 15 MG PO TABS: 15.0000 mg | ORAL_TABLET | Freq: Two times a day (BID) | ORAL | 0 refills | Status: DC

## 2018-11-16 MED ORDER — SERTRALINE HCL 100 MG PO TABS: 200.0000 mg | ORAL_TABLET | ORAL | 0 refills | Status: DC

## 2018-11-16 NOTE — Progress Notes (Signed)
Virtual Visit via Telephone Note  I connected with Denise [name]  on 11/16/18 at  1:30 PM EDT by telephone and verified that I am speaking with the correct person using two identifiers. She does not have a smart phone or Internet so is unable to do a video session. She does not have reliable transportation and has numerous health issues so has declined face to face appointments.   Location: Patient: home Provider: office   I discussed the limitations, risks, security and privacy concerns of performing an evaluation and management service by telephone and the availability of in person appointments. I also discussed with the patient that there may be a patient responsible charge related to this service. The patient expressed understanding and agreed to proceed.   History of Present Illness: "Not so good". She is depressed and has no motivation or energy to do anything. Klohe spends a lot of lying down. "I watch tv and play games on my phone". Her balance is off and her body aches all over. Sleep is poor and she is napping during the day. [name] denies SI/HI. She does not go out and does not talk with anyone besides her husband. Myfanwy continues to experience AVH with no change in intensity or frequency. Her mind is always going. PTSD is "a little better". "I don't know how to pick myself up to make it better. Its not like I don't want to but its something inside me that just won't".   Husband has stopped buying cigarettes so she is no longer smoking.     Observations/Objective: I spoke with Denise [name] on the phone.  Pt was calm and cooperative.  Pt was distracted and trying to talk to me and her husband during the call.  Speech was clear and coherent with normal rate. Somewhat monotone and high volume.  Mood is depressed and anxious, affect is congruent. Thought processes are coherent and concrete.  Thought content is with ruminations.  Pt denies SI/HI.   Pt reports  auditory and visual hallucinations and did not appear to be responding to internal stimuli during our session.  Memory and concentration are poor.  Fund of knowledge and use of language are on the poor side.  Insight and judgment are fair.  I am unable to comment on psychomotor activity, general appearance, hygiene, or eye contact as I was unable to physically see the patient on the phone.  Vital signs not available since interview conducted virtually.    I reviewed the information below on 11/16/2018 and have updated it Assessment and Plan: MDD-recurrent, severe with psychotic features; PTSD; social anxiety disorder; nicotine use disorder   Status of current symptoms: ongoing depression, quit smoking  Reviewed labs done on 10/30/2018- HbA1c and triglycerides are up and I counseled on diet changes and weight loss. She is seeing her PCP next week I advised her to speak with her PCP regarding elevated AST, trig and HbA1c.  D/c Latuda  Start trial of [name]- titrate up to 1mg  over next 1 month  Buspar 15mg  po BID  Zoloft 200mg  po qD  Follow Up Instructions: In 6 weeks or sooner if needed   I discussed the assessment and treatment plan with the patient. The patient was provided an opportunity to ask questions and all were answered. The patient agreed with the plan and demonstrated an understanding of the instructions.   The patient was advised to call back or seek an in-person evaluation if the symptoms worsen or if the condition fails  to improve as anticipated.  I provided 20 minutes of non-face-to-face time during this encounter.   Charlcie Cradle, MD

## 2018-11-23 ENCOUNTER — Telehealth (HOSPITAL_COMMUNITY): Payer: Self-pay

## 2018-11-23 NOTE — Telephone Encounter (Signed)
[place] 0.5MG  PA# [id number] APPROVED 1027/2020 TO 11/16/2019

## 2019-01-04 ENCOUNTER — Other Ambulatory Visit: Payer: Self-pay

## 2019-01-04 ENCOUNTER — Ambulatory Visit (INDEPENDENT_AMBULATORY_CARE_PROVIDER_SITE_OTHER): Payer: Medicaid Other | Admitting: Psychiatry

## 2019-01-04 ENCOUNTER — Encounter (HOSPITAL_COMMUNITY): Payer: Self-pay | Admitting: Psychiatry

## 2019-01-04 DIAGNOSIS — G4701 Insomnia due to medical condition: Secondary | ICD-10-CM

## 2019-01-04 DIAGNOSIS — F4312 Post-traumatic stress disorder, chronic: Secondary | ICD-10-CM

## 2019-01-04 DIAGNOSIS — G2589 Other specified extrapyramidal and movement disorders: Secondary | ICD-10-CM

## 2019-01-04 DIAGNOSIS — F333 Major depressive disorder, recurrent, severe with psychotic symptoms: Secondary | ICD-10-CM

## 2019-01-04 MED ORDER — BENZTROPINE MESYLATE 1 MG PO TABS: 1.0000 mg | ORAL_TABLET | Freq: Two times a day (BID) | ORAL | 0 refills | Status: DC

## 2019-01-04 NOTE — Progress Notes (Signed)
  Virtual Visit via Telephone Note  I connected with Denise [name] on 01/04/19 at  2:30 PM EST by telephone and verified that I am speaking with the correct person using two identifiers.  Location: Patient: home Provider: office   I discussed the limitations, risks, security and privacy concerns of performing an evaluation and management service by telephone and the availability of in person appointments. I also discussed with the patient that there may be a patient responsible charge related to this service. The patient expressed understanding and agreed to proceed.   History of Present Illness: Pt states [name] is working well. She is feeling calmer and she is having less negative thoughts. Her depression is slowly getting better. Denise [name] is still having trouble with reading. She states the letters change when she tries to read. Her attention is poor. She is not sleeping well and is still having nightmares. Denise [name] is having less frequent and intense SI. She denies plan or intent. She denies HI. She continues to have AVH at night. Anxiety remains high. At times she feels like she isn't in her body and is just watching herself. She still cries randomly. She is having a lot of uncontrolled leg movements since starting [name].     Observations/Objective: I spoke with Denise [name] on the phone.  Pt was calm, pleasant and cooperative.  Pt was engaged in the conversation and answered questions appropriately.  Speech was clear and coherent with normal rate, tone and volume.  Mood is depressed and anxious, affect is congruent and noticeably calmer and brighter than previous visits. Thought processes are coherent and concrete.  Thought content is some mild paranoia.  Pt denies SI/HI today.  She does report infrequent SI without a plan or intention.  Pt reports auditory and visual hallucinations and did not appear to be responding to internal stimuli.  Memory and concentration are  poor.  Fund of knowledge and use of language are average.  Insight and judgment are fair.  I am unable to comment on psychomotor activity, general appearance, hygiene, or eye contact as I was unable to physically see the patient on the phone.  Vital signs not available since interview conducted virtually.     Assessment and Plan: MDD-recurrent, severe with psychotic features; PTSD; social anxiety disorder; nicotine use disorder   Status of current symptoms: mild improvement in depression  [name] 1mg  po qD. If tolerable will increase in future   Buspar 15mg  po BID   Zoloft 200mg  po qD  Start trial of Cogentin 1mg  po BID for EPS related to [name]  She is reporting lower limb movements that started after [name] use.  I am starting Cogentin and will follow-up in 2 weeks.  If no improvement then I will stop [name] and we will try a different medication.  Follow Up Instructions: In 2 weeks or sooner if needed   I discussed the assessment and treatment plan with the patient. The patient was provided an opportunity to ask questions and all were answered. The patient agreed with the plan and demonstrated an understanding of the instructions.   The patient was advised to call back or seek an in-person evaluation if the symptoms worsen or if the condition fails to improve as anticipated.  I provided 20 minutes of non-face-to-face time during this encounter.   Charlcie Cradle, MD

## 2019-01-18 ENCOUNTER — Encounter (HOSPITAL_COMMUNITY): Payer: Self-pay | Admitting: Psychiatry

## 2019-01-18 ENCOUNTER — Other Ambulatory Visit: Payer: Self-pay

## 2019-01-18 ENCOUNTER — Ambulatory Visit (INDEPENDENT_AMBULATORY_CARE_PROVIDER_SITE_OTHER): Payer: Medicaid Other | Admitting: Psychiatry

## 2019-01-18 DIAGNOSIS — F4312 Post-traumatic stress disorder, chronic: Secondary | ICD-10-CM | POA: Diagnosis not present

## 2019-01-18 DIAGNOSIS — F411 Generalized anxiety disorder: Secondary | ICD-10-CM

## 2019-01-18 DIAGNOSIS — F333 Major depressive disorder, recurrent, severe with psychotic symptoms: Secondary | ICD-10-CM | POA: Diagnosis not present

## 2019-01-18 MED ORDER — SERTRALINE HCL 100 MG PO TABS: 200.0000 mg | ORAL_TABLET | ORAL | 0 refills | Status: DC

## 2019-01-18 MED ORDER — BUSPIRONE HCL 15 MG PO TABS: 15.0000 mg | ORAL_TABLET | Freq: Two times a day (BID) | ORAL | 0 refills | Status: DC

## 2019-01-18 MED ORDER — BREXPIPRAZOLE 1 MG PO TABS: 1.0000 mg | ORAL_TABLET | Freq: Every day | ORAL | 0 refills | Status: DC

## 2019-01-18 MED ORDER — GABAPENTIN 300 MG PO CAPS: 300.0000 mg | ORAL_CAPSULE | Freq: Every day | ORAL | 0 refills | Status: DC

## 2019-01-18 NOTE — Progress Notes (Signed)
  Virtual Visit via Telephone Note  I connected with Denise [name] on 01/18/19 at 11:00 AM EST by telephone and verified that I am speaking with the correct person using two identifiers.  Location: Patient: daughter's house Provider: office   I discussed the limitations, risks, security and privacy concerns of performing an evaluation and management service by telephone and the availability of in person appointments. I also discussed with the patient that there may be a patient responsible charge related to this service. The patient expressed understanding and agreed to proceed.   History of Present Illness: Pt is experiencing HA that occur off/on thru the day for a while now. It is bothering her right now.  Her depression is improving slowly. She is more interactive and is isolating less. She is less irritable and snappy. Denise [name] feels good about herself. She denies SI/HI. She is not sure if she is still have AVH because of her HA. No change in low leg movement with Cogentin. It is causing her insomnia. She tries to nap during the day and notes that sometimes it is ok but other times her legs will move like a night.     Observations/Objective: I spoke with Denise [name] on the phone.  Pt was calm, pleasant and cooperative.  Pt was minimally engaged in the conversation due to her HA.  Speech was clear and coherent with normal rate, tone and volume.  Mood is depressed and anxious, affect is congruent but brighter than before. Thought processes are coherent and concrete.  Thought content is lgocial.  Pt denies SI/HI.   Pt denies auditory and visual hallucinations and did not appear to be responding to internal stimuli.  Memory and concentration are good.  Fund of knowledge and use of language are average.  Insight and judgment are fair.  I am unable to comment on psychomotor activity, general appearance, hygiene, or eye contact as I was unable to physically see the patient on the  phone.  Vital signs not available since interview conducted virtually.    I reviewed the information below on 01/18/2019 and have updated it.  Assessment and Plan:  MDD-recurrent, severe with psychotic features; PTSD; social anxiety disorder; GAD; nicotine use disorder ; r/o RLS vs EPS   Status of current symptoms: mild improvement in depression   [name] 1mg  po qD. If tolerable will increase in future   Buspar 15mg  po BID   Zoloft 200mg  po qD  D/c Cogentin- ineffective    Start trial of Neurontin 300mg  po qHS for EPS vs RLS   She is reporting lower limb movements that started after [name] use.  I am starting Neurontin. Pt is reporting significant improvement in depression with [name] and does not want to stop it.   If no improvement in lower limb movement then I will consider stopping [name] and we will try a different medication.   Follow Up Instructions: In 8 weeks or sooner if needed   I discussed the assessment and treatment plan with the patient. The patient was provided an opportunity to ask questions and all were answered. The patient agreed with the plan and demonstrated an understanding of the instructions.   The patient was advised to call back or seek an in-person evaluation if the symptoms worsen or if the condition fails to improve as anticipated.  I provided 15 minutes of non-face-to-face time during this encounter.   Denise Cradle, MD

## 2019-02-19 ENCOUNTER — Other Ambulatory Visit: Payer: Self-pay | Admitting: Psychiatry

## 2019-02-19 DIAGNOSIS — Z1231 Encounter for screening mammogram for malignant neoplasm of breast: Secondary | ICD-10-CM

## 2019-02-28 ENCOUNTER — Emergency Department (HOSPITAL_COMMUNITY)
Admission: EM | Admit: 2019-02-28 | Discharge: 2019-02-28 | Disposition: A | Discharge status: Home / Self Care | Payer: Medicaid Other | Attending: Emergency Medicine | Admitting: Emergency Medicine

## 2019-02-28 ENCOUNTER — Emergency Department (HOSPITAL_COMMUNITY): Payer: Medicaid Other

## 2019-02-28 ENCOUNTER — Other Ambulatory Visit: Payer: Self-pay

## 2019-02-28 ENCOUNTER — Emergency Department (HOSPITAL_BASED_OUTPATIENT_CLINIC_OR_DEPARTMENT_OTHER): Payer: Medicaid Other

## 2019-02-28 DIAGNOSIS — Z87891 Personal history of nicotine dependence: Secondary | ICD-10-CM | POA: Diagnosis not present

## 2019-02-28 DIAGNOSIS — M79609 Pain in unspecified limb: Secondary | ICD-10-CM | POA: Diagnosis not present

## 2019-02-28 DIAGNOSIS — E785 Hyperlipidemia, unspecified: Secondary | ICD-10-CM | POA: Insufficient documentation

## 2019-02-28 DIAGNOSIS — U071 COVID-19: Secondary | ICD-10-CM | POA: Diagnosis not present

## 2019-02-28 DIAGNOSIS — I10 Essential (primary) hypertension: Secondary | ICD-10-CM | POA: Insufficient documentation

## 2019-02-28 DIAGNOSIS — M79601 Pain in right arm: Secondary | ICD-10-CM | POA: Insufficient documentation

## 2019-02-28 DIAGNOSIS — R0789 Other chest pain: Secondary | ICD-10-CM | POA: Diagnosis present

## 2019-02-28 DIAGNOSIS — Z7984 Long term (current) use of oral hypoglycemic drugs: Secondary | ICD-10-CM | POA: Diagnosis not present

## 2019-02-28 DIAGNOSIS — Z8673 Personal history of transient ischemic attack (TIA), and cerebral infarction without residual deficits: Secondary | ICD-10-CM | POA: Diagnosis not present

## 2019-02-28 DIAGNOSIS — R7303 Prediabetes: Secondary | ICD-10-CM | POA: Diagnosis not present

## 2019-02-28 DIAGNOSIS — R112 Nausea with vomiting, unspecified: Secondary | ICD-10-CM | POA: Diagnosis not present

## 2019-02-28 DIAGNOSIS — Z79899 Other long term (current) drug therapy: Secondary | ICD-10-CM | POA: Diagnosis not present

## 2019-02-28 DIAGNOSIS — R059 Cough, unspecified: Secondary | ICD-10-CM

## 2019-02-28 DIAGNOSIS — R079 Chest pain, unspecified: Secondary | ICD-10-CM

## 2019-02-28 DIAGNOSIS — R05 Cough: Secondary | ICD-10-CM

## 2019-02-28 LAB — CBC
HCT: 42.5 % (ref 36.0–46.0)
Hemoglobin: 13.4 g/dL (ref 12.0–15.0)
MCH: 28.3 pg (ref 26.0–34.0)
MCHC: 31.5 g/dL (ref 30.0–36.0)
MCV: 89.7 fL (ref 80.0–100.0)
Platelets: 371 10*3/uL (ref 150–400)
RBC: 4.74 MIL/uL (ref 3.87–5.11)
RDW: 18.5 % — ABNORMAL HIGH (ref 11.5–15.5)
WBC: 7.8 10*3/uL (ref 4.0–10.5)
nRBC: 0 % (ref 0.0–0.2)

## 2019-02-28 LAB — COMPREHENSIVE METABOLIC PANEL
ALT: 41 U/L (ref 0–44)
AST: 40 U/L (ref 15–41)
Albumin: 3.6 g/dL (ref 3.5–5.0)
Alkaline Phosphatase: 81 U/L (ref 38–126)
Anion gap: 12 (ref 5–15)
BUN: 5 mg/dL — ABNORMAL LOW (ref 6–20)
CO2: 25 mmol/L (ref 22–32)
Calcium: 9.2 mg/dL (ref 8.9–10.3)
Chloride: 106 mmol/L (ref 98–111)
Creatinine, Ser: 0.66 mg/dL (ref 0.44–1.00)
GFR calc Af Amer: >60 mL/min (ref >60)
GFR calc non Af Amer: >60 mL/min (ref >60)
Glucose, Bld: 109 mg/dL — ABNORMAL HIGH (ref 70–99)
Potassium: 3.7 mmol/L (ref 3.5–5.1)
Sodium: 143 mmol/L (ref 135–145)
Total Bilirubin: 0.4 mg/dL (ref 0.3–1.2)
Total Protein: 6.7 g/dL (ref 6.5–8.1)

## 2019-02-28 LAB — TROPONIN I (HIGH SENSITIVITY): Troponin I (High Sensitivity): 17 ng/L (ref <18)

## 2019-02-28 LAB — SARS CORONAVIRUS 2 (TAT 6-24 HRS): SARS Coronavirus 2: POSITIVE — AB

## 2019-02-28 MED ORDER — ACETAMINOPHEN 325 MG PO TABS
650.0000 mg | ORAL_TABLET | ORAL | Status: DC | PRN
  Filled 2019-02-28: qty 2

## 2019-02-28 MED ORDER — KETOROLAC TROMETHAMINE 15 MG/ML IJ SOLN
15.0000 mg | Freq: Once | INTRAMUSCULAR | Status: DC
  Filled 2019-02-28: qty 1

## 2019-02-28 MED ORDER — IPRATROPIUM-ALBUTEROL 0.5-2.5 (3) MG/3ML IN SOLN: 3.0000 mL | Freq: Once | RESPIRATORY_TRACT | Status: DC

## 2019-02-28 MED ORDER — ACETAMINOPHEN 80 MG PO CHEW: 80.0000 mg | CHEWABLE_TABLET | ORAL | Status: DC | PRN

## 2019-02-28 MED ORDER — ALBUTEROL SULFATE HFA 108 (90 BASE) MCG/ACT IN AERS
1.0000 | INHALATION_SPRAY | RESPIRATORY_TRACT | Status: DC | PRN
  Filled 2019-02-28: qty 6.7

## 2019-02-28 NOTE — Discharge Instructions (Addendum)
To Ms. Skylyr,  Thank you for choosing [facility] for your health management. During your stay, you were treated with medications for your shoulder pain, and working up the cause for your chills, nausea and vomiting. Your COVID results should be completed in the next 6-24 hours. Please come back if you develop shortness of breath, shortness of breath, or notice a quick decline in your mentation or daily living.

## 2019-02-28 NOTE — ED Provider Notes (Signed)
[facility] EMERGENCY DEPARTMENT Provider Note   CSN: [id number] Arrival date & time: 02/28/19  0825     History No chief complaint on file.   Denise [name] is a 49 y/o female, with a PMH of Depression, anxiety, prediabetes, stroke, and seizure disorder, that presents to the [facility] with myalgias, coughing, and chest pain. Patient states that her symptoms started 2 weeks ago. She states that her first symptoms started as myalgias and coughing. Her symptoms progressed to nausea with vomiting. Her vomitus was described as "clear." She has also been experiencing diarrhea during these past two weeks. Ultimately, she woke up at 3:00 am on 02/28/2019 after a coughing fit that "sounded like a car trying to start," With chest pain. She states that her chest pain occurs on deep inspirations and feels, "Like twisting and stretching" in character and has tried nothing for alleviation. She states that the pain will radiate to up her R chest wall on inspiration.        Past Medical History:  Diagnosis Date   Abnormal EKG    AD (atopic dermatitis)    Alcohol use 04/14/2015   Anxiety    Blood transfusion without reported diagnosis    Depression    Fibroid uterus 12/18/2014   Hyperlipemia    Hypertension    Memory changes    Overactive bladder    Pelvic pain in female 12/18/2014   Prediabetes    Right sided weakness    Seizures ([place])    Stroke ([place])    Substance abuse ([place])    Syncope 02/07/2015   TIA (transient ischemic attack)    Tobacco use 04/14/2015    Patient Active Problem List   Diagnosis Date Noted   Severe episode of recurrent major depressive disorder, without psychotic features ([place]) 11/03/2017   Social anxiety disorder 11/03/2017   Tobacco use 04/14/2015   Alcohol use 04/14/2015   Right sided weakness    Syncope 02/07/2015   Pelvic pain in female 12/18/2014   Fibroid uterus 12/18/2014   Hypertension 12/18/2014     Past Surgical History:  Procedure Laterality Date   CESAREAN SECTION     x 2     OB History    Gravida  3   Para  3   Term      Preterm      AB      Living  3     SAB      TAB      Ectopic      Multiple      Live Births              Family History  Problem Relation Age of Onset   Stroke Mother    Anxiety disorder Mother    Drug abuse Mother    Alcohol abuse Mother    Depression Mother    Heart disease Maternal Grandmother    Drug abuse Father    Anxiety disorder Father    Colon cancer Neg Hx    Esophageal cancer Neg Hx    Pancreatic cancer Neg Hx    Stomach cancer Neg Hx    Liver disease Neg Hx     Social History   Tobacco Use   Smoking status: Former Smoker    Packs/day: 0.50    Types: Cigarettes    Quit date: 10/26/2018    Years since quitting: 0.3   Smokeless tobacco: Never Used  Substance Use Topics   Alcohol use: Not Currently  Alcohol/week: 0.0 standard drinks    Comment: 2 a day   Drug use: No    Types: "Crack" cocaine    Comment: JANUARY 2017    Home Medications Prior to Admission medications   Medication Sig Start Date End Date Taking? Authorizing Provider  amLODipine (NORVASC) 10 MG tablet Take 10 mg by mouth daily.    [provider]  atorvastatin (LIPITOR) 10 MG tablet Take 10 mg by mouth at bedtime. 06/29/15   [provider]  brexpiprazole ([name]) 1 MG TABS tablet Take 1 tablet (1 mg total) by mouth daily. 01/18/19   Charlcie Cradle, MD  busPIRone (BUSPAR) 15 MG tablet Take 1 tablet (15 mg total) by mouth 2 (two) times daily. 01/18/19   Charlcie Cradle, MD  gabapentin (NEURONTIN) 300 MG capsule Take 1 capsule (300 mg total) by mouth at bedtime. 01/18/19 01/18/20  Charlcie Cradle, MD  ibuprofen (ADVIL,MOTRIN) 200 MG tablet Take 400 mg by mouth daily as needed for mild pain.    [provider]  lisinopril (PRINIVIL,ZESTRIL) 20 MG tablet Take 20 mg by mouth 2 (two) times  daily.    [provider]  metFORMIN (GLUCOPHAGE) 500 MG tablet Take 500 mg by mouth 2 (two) times daily. 05/25/17   [provider]  polyethylene glycol powder (GLYCOLAX/MIRALAX) powder Mix 17 g with 4-8 ounces of juice and drink once a day 06/26/15   [provider]  sertraline (ZOLOFT) 100 MG tablet Take 2 tablets (200 mg total) by mouth every morning. 01/18/19 04/18/19  Charlcie Cradle, MD    Allergies    Grapefruit flavor [flavoring agent]  Review of Systems   Review of Systems  Constitutional: Positive for chills. Negative for appetite change, diaphoresis, fatigue and fever.  Respiratory: Positive for cough and wheezing. Negative for chest tightness and shortness of breath.   Cardiovascular: Positive for chest pain. Negative for leg swelling.  Gastrointestinal: Positive for constipation, diarrhea, nausea and vomiting. Negative for abdominal distention and abdominal pain.  Neurological: Positive for headaches. Negative for dizziness, seizures, syncope, weakness and numbness.    Physical Exam Updated Vital Signs BP 122/70 (BP Location: Left Arm)    Pulse 80    Temp 98.3 F (36.8 C) (Oral)    Resp 20    Ht 5\' 5"  (1.651 m)    Wt 131.1 kg    SpO2 94%    BMI 48.09 kg/m   Physical Exam Constitutional:      General: She is not in acute distress.    Appearance: Normal appearance. She is not ill-appearing.  HENT:     Head: Normocephalic and atraumatic.     Right Ear: External ear normal.     Left Ear: External ear normal.  Cardiovascular:     Rate and Rhythm: Normal rate and regular rhythm.     Pulses: Normal pulses.     Heart sounds: Normal heart sounds. No murmur. No friction rub. No gallop.   Pulmonary:     Effort: Pulmonary effort is normal.     Breath sounds: Normal breath sounds.  Abdominal:     General: Abdomen is flat. Bowel sounds are normal.     Palpations: Abdomen is soft.     Tenderness: There is no abdominal tenderness. There is no guarding.   Musculoskeletal:        General: Tenderness present.     Right lower leg: No edema.     Left lower leg: No edema.     Comments: RUE shoulder tenderness  to palpation, full ROM of RUE.   Skin:    General: Skin is warm and dry.     Findings: No lesion or rash.  Neurological:     Mental Status: She is alert and oriented to person, place, and time.  Psychiatric:        Mood and Affect: Mood normal.        Behavior: Behavior normal.     ED Results / Procedures / Treatments   Labs (all labs ordered are listed, but only abnormal results are displayed) Labs Reviewed  COMPREHENSIVE METABOLIC PANEL - Abnormal; Notable for the following components:      Result Value   Glucose, Bld 109 (*)    BUN 5 (*)    All other components within normal limits  CBC - Abnormal; Notable for the following components:   RDW 18.5 (*)    All other components within normal limits  SARS CORONAVIRUS 2 (TAT 6-24 HRS)  TROPONIN I (HIGH SENSITIVITY)  TROPONIN I (HIGH SENSITIVITY)    EKG EKG Interpretation  Date/Time:  Wednesday February 28 2019 08:26:40 EST Ventricular Rate:  77 PR Interval:    QRS Duration: 78 QT Interval:  444 QTC Calculation: 503 R Axis:   31 Text Interpretation: Sinus rhythm Low voltage, precordial leads Anteroseptal infarct, old Nonspecific T abnormalities, inferior leads Abnormal ECG Confirmed by Carmin Muskrat [phone]) on 02/28/2019 9:20:44 AM   Radiology DG Chest Port 1 View  Result Date: 02/28/2019 CLINICAL DATA:  Chest pain, cough. EXAM: PORTABLE CHEST 1 VIEW COMPARISON:  Jun 14, 2016. FINDINGS: The heart size and mediastinal contours are within normal limits. Both lungs are clear. No pneumothorax or pleural effusion is noted. The visualized skeletal structures are unremarkable. IMPRESSION: No active disease. Electronically Signed   By: Marijo Conception M.D.   On: 02/28/2019 09:49   UE Venous Duplex ([facility] and [facility] ONLY)  Result Date: 02/28/2019 UPPER VENOUS STUDY  Indications: Pain  Comparison Study: no prior Performing Technologist: Abram Sander RVS  Examination Guidelines: A complete evaluation includes B-mode imaging, spectral Doppler, color Doppler, and power Doppler as needed of all accessible portions of each vessel. Bilateral testing is considered an integral part of a complete examination. Limited examinations for reoccurring indications may be performed as noted.  Right Findings: +----------+------------+---------+-----------+----------+-------+  RIGHT      Compressible Phasicity Spontaneous Properties Summary  +----------+------------+---------+-----------+----------+-------+  IJV            Full        Yes        Yes                         +----------+------------+---------+-----------+----------+-------+  Subclavian     Full        Yes        Yes                         +----------+------------+---------+-----------+----------+-------+  Axillary       Full        Yes        Yes                         +----------+------------+---------+-----------+----------+-------+  Brachial       Full        Yes        Yes                         +----------+------------+---------+-----------+----------+-------+  Radial         Full                                               +----------+------------+---------+-----------+----------+-------+  Ulnar          Full                                               +----------+------------+---------+-----------+----------+-------+  Cephalic       Full                                               +----------+------------+---------+-----------+----------+-------+  Basilic        Full                                               +----------+------------+---------+-----------+----------+-------+  Summary:  Right: No evidence of deep vein thrombosis in the upper extremity. No evidence of superficial vein thrombosis in the upper extremity.  *See table(s) above for measurements and observations.    Preliminary     Procedures Procedures (including  critical care time)  Medications Ordered in ED Medications  ketorolac (TORADOL) 15 MG/ML injection 15 mg (has no administration in time range)  acetaminophen (TYLENOL) tablet 650 mg (has no administration in time range)  albuterol (VENTOLIN HFA) 108 (90 Base) MCG/ACT inhaler 1 puff (has no administration in time range)    ED Course  I have reviewed the triage vital signs and the nursing notes.  Pertinent labs & imaging results that were available during my care of the patient were reviewed by me and considered in my medical decision making (see chart for details).    MDM Rules/Calculators/A&P                      Denise [name] is a 49 y/o female, with a PMH of HTN, HLD, depression, and stroke, who presented with nausea, vomiting, myalgias, arm and chest pain. Patient's upper extremity doppler was negative for DVT, and after pain management she had no complaints of pain. Despite her wheezing, myalgias and vomiting, her vitals were within acceptable ranges and she was deemed safe for discharge. She was instructed to come back if she experienced shortness of breath or acute decline in her condition. Her COVID test is currently pending.   Final Clinical Impression(s) / ED Diagnoses Final diagnoses:  Chest pain, unspecified type  Cough  Pain of right upper extremity    Rx / DC Orders ED Discharge Orders    None       Maudie Mercury, MD 02/28/19 1156    Carmin Muskrat, MD 02/28/19 1626

## 2019-02-28 NOTE — Progress Notes (Signed)
Upper extremity venous has been completed.   Preliminary results in CV Proc.   Abram Sander 02/28/2019 11:18 AM

## 2019-02-28 NOTE — ED Triage Notes (Signed)
2 week hx of cough, body aches, chills.  This AM c/o new onset R side chest pain, radiating in to R arm with SOB.  HA and N/V through the [facility].  Alert and oriented in NAD.  Dry cough noted.

## 2019-03-01 ENCOUNTER — Telehealth: Payer: Self-pay | Admitting: Physician Assistant

## 2019-03-01 NOTE — Telephone Encounter (Signed)
Called to discuss with Denise [name] about Covid symptoms and the use of bamlanivimab, a monoclonal antibody infusion for those with mild to moderate Covid symptoms and at a high risk of hospitalization.    Pt does not qualify for infusion therapy as her symptoms first presented > 10 days prior to timing of infusion. Symptoms tier reviewed as well as criteria for ending isolation. Preventative practices reviewed. Patient verbalized understanding  Montey Hora, PA - C

## 2019-03-15 ENCOUNTER — Ambulatory Visit (HOSPITAL_COMMUNITY): Payer: Medicaid Other | Admitting: Psychiatry

## 2019-03-15 ENCOUNTER — Other Ambulatory Visit: Payer: Self-pay

## 2019-03-15 ENCOUNTER — Telehealth (HOSPITAL_COMMUNITY): Payer: Self-pay | Admitting: Psychiatry
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
## 2019-03-22 ENCOUNTER — Other Ambulatory Visit: Payer: Self-pay

## 2019-03-22 ENCOUNTER — Ambulatory Visit (INDEPENDENT_AMBULATORY_CARE_PROVIDER_SITE_OTHER): Payer: Medicaid Other | Admitting: Psychiatry

## 2019-03-22 ENCOUNTER — Encounter (HOSPITAL_COMMUNITY): Payer: Self-pay | Admitting: Psychiatry

## 2019-03-22 DIAGNOSIS — F333 Major depressive disorder, recurrent, severe with psychotic symptoms: Secondary | ICD-10-CM

## 2019-03-22 DIAGNOSIS — F411 Generalized anxiety disorder: Secondary | ICD-10-CM | POA: Diagnosis not present

## 2019-03-22 DIAGNOSIS — F4312 Post-traumatic stress disorder, chronic: Secondary | ICD-10-CM | POA: Diagnosis not present

## 2019-03-22 MED ORDER — SERTRALINE HCL 100 MG PO TABS: 200.0000 mg | ORAL_TABLET | ORAL | 0 refills | Status: DC

## 2019-03-22 MED ORDER — BUSPIRONE HCL 10 MG PO TABS: 20.0000 mg | ORAL_TABLET | Freq: Two times a day (BID) | ORAL | 0 refills | Status: DC

## 2019-03-22 MED ORDER — GABAPENTIN 400 MG PO CAPS: 400.0000 mg | ORAL_CAPSULE | Freq: Every day | ORAL | 0 refills | Status: DC

## 2019-03-22 NOTE — Progress Notes (Signed)
Virtual Visit via Video Note  I connected with Denise [name] on 03/22/19 at  2:15 PM EST by phone. She does not Internet or other means to do a video enabled telemedicine application and verified that I am speaking with the correct person using two identifiers.  Location: Patient: office Provider: office   I discussed the limitations of evaluation and management by telemedicine and the availability of in person appointments. The patient expressed understanding and agreed to proceed.  History of Present Illness: Pt reports her anxiety is overwhelming. She has racing thoughts, body aches, GI upset. She is sleeping a little better ( 5hrs) but it is not restful sleep. Her mood is up and down. She is depressed and easily irritated. Her PTSD is unchanged. She continues to experience intrusive memories and HV. She denies SI/HI. Safia states that the Neurontin has helped to decrease the lower leg movements and would like to increase the dose.    Observations/Objective:  General Appearance: unable to assess  Eye Contact:  unable to assess  Speech:  Clear and Coherent and Normal Rate  Volume:  Normal  Mood:  Anxious and Depressed  Affect:  Congruent  Thought Process:  Coherent, Linear and Descriptions of Associations: Circumstantial  Orientation:  Full (Time, Place, and Person)  Thought Content:  Rumination  Suicidal Thoughts:  No  Homicidal Thoughts:  No  Memory:  Immediate;   Poor  Judgement:  Fair  Insight:  Fair  Psychomotor Activity:  unable to assess  Concentration:  Concentration: Poor  Recall:  Poor  Fund of Knowledge:  Fair  Language:  Fair  Akathisia:  unable to assess  Handed:  Right  AIMS (if indicated):     Assets:  Desire for Improvement Financial Resources/Insurance Housing Intimacy  ADL's:  Unable to assess  Cognition:  WNL  Sleep:       I reviewed the information below on 03/22/2019 and have updated it Assessment and Plan:  MDD-recurrent, severe with  psychotic features; PTSD; social anxiety disorder; GAD; nicotine use disorder ; r/o RLS vs EPS     D/c [name] due to potential SE of EPS and prolonged QTc   Increase Buspar 20mg  po BID   Zoloft 200mg  po qD  Increase Neurontin 400mg  po qHS for EPS vs RLS  Reviewed labs and EKG 02/28/2019- QTc 503   Follow Up Instructions: In 2 months or sooner if needed   I discussed the assessment and treatment plan with the patient. The patient was provided an opportunity to ask questions and all were answered. The patient agreed with the plan and demonstrated an understanding of the instructions.   The patient was advised to call back or seek an in-person evaluation if the symptoms worsen or if the condition fails to improve as anticipated.  I provided 44minutes of non-face-to-face time during this encounter.   Charlcie Cradle, MD

## 2019-03-29 ENCOUNTER — Other Ambulatory Visit: Payer: Self-pay

## 2019-03-29 ENCOUNTER — Ambulatory Visit
Admission: RE | Admit: 2019-03-29 | Discharge: 2019-03-29 | Disposition: A | Discharge status: Home / Self Care | Payer: Medicaid Other | Source: Ambulatory Visit | Attending: Psychiatry | Admitting: Psychiatry

## 2019-03-29 DIAGNOSIS — Z1231 Encounter for screening mammogram for malignant neoplasm of breast: Secondary | ICD-10-CM

## 2019-05-17 ENCOUNTER — Encounter (HOSPITAL_COMMUNITY): Payer: Self-pay | Admitting: Psychiatry

## 2019-05-17 ENCOUNTER — Other Ambulatory Visit: Payer: Self-pay

## 2019-05-17 ENCOUNTER — Telehealth (INDEPENDENT_AMBULATORY_CARE_PROVIDER_SITE_OTHER): Payer: Medicaid Other | Admitting: Psychiatry

## 2019-05-17 DIAGNOSIS — F411 Generalized anxiety disorder: Secondary | ICD-10-CM | POA: Diagnosis not present

## 2019-05-17 DIAGNOSIS — F4312 Post-traumatic stress disorder, chronic: Secondary | ICD-10-CM | POA: Diagnosis not present

## 2019-05-17 DIAGNOSIS — F333 Major depressive disorder, recurrent, severe with psychotic symptoms: Secondary | ICD-10-CM | POA: Diagnosis not present

## 2019-05-17 MED ORDER — LITHIUM CARBONATE ER 300 MG PO TBCR: 300.0000 mg | EXTENDED_RELEASE_TABLET | Freq: Two times a day (BID) | ORAL | 1 refills | Status: DC

## 2019-05-17 MED ORDER — GABAPENTIN 400 MG PO CAPS: 400.0000 mg | ORAL_CAPSULE | Freq: Every day | ORAL | 0 refills | Status: DC

## 2019-05-17 MED ORDER — SERTRALINE HCL 100 MG PO TABS: 200.0000 mg | ORAL_TABLET | ORAL | 0 refills | Status: DC

## 2019-05-17 MED ORDER — BUSPIRONE HCL 10 MG PO TABS: 20.0000 mg | ORAL_TABLET | Freq: Two times a day (BID) | ORAL | 0 refills | Status: DC

## 2019-05-17 NOTE — BH Specialist Note (Signed)
Virtual Visit via Telephone Note  I connected with Denise [name] on 05/17/19 at  9:30 AM EDT by telephone and verified that I am speaking with the correct person using two identifiers.  Location: Patient: home Provider: office   I discussed the limitations, risks, security and privacy concerns of performing an evaluation and management service by telephone and the availability of in person appointments. I also discussed with the patient that there may be a patient responsible charge related to this service. The patient expressed understanding and agreed to proceed.   History of Present Illness: "Not too good". Her mind is all over the place and she can't focus. It is causing headaches and insomnia. At times she feels she is not present. She gets confused easily.  Her depression is ongoing. She is isolating. She has low motivation and anhedonia. Denise [name] denies SI/HI. She is very religious and does not believe in suicide. Her PTSD is still unchanged. She has nightmares, HV and intrusive memories. She is not sure if the increase in Buspar or addition of Neurontin has helped at all.     Observations/Objective:  General Appearance: unable to assess  Eye Contact:  unable to assess  Speech:  Clear and Coherent and Normal Rate  Volume:  Normal  Mood:  Anxious and Depressed  Affect:  Congruent  Thought Process:  Coherent and Descriptions of Associations: Circumstantial, concrete  Orientation:  Full (Time, Place, and Person)  Thought Content:  Rumination  Suicidal Thoughts:  No  Homicidal Thoughts:  No  Memory:  Immediate;   Good  Judgement:  Fair  Insight:  Fair  Psychomotor Activity: unable to assess  Concentration:  Concentration: Poor  Recall:  Poor  Fund of Knowledge:  Fair  Language:  Good  Akathisia:  unable to assess  Handed:  Right  AIMS (if indicated):     Assets:  Desire for Improvement Financial Resources/Insurance Housing Intimacy Leisure  Time Talents/Skills Transportation Vocational/Educational  ADL's:  unable to assess  Cognition:  WNL  Sleep:        I reviewed the information below on 05/17/19 and have updated it Assessment and Plan: GAD: MDD-recurrent, severe with psychotic features; PTSD; social anxiety disorder; nicotine use disorder ; r/o RLS vs EPS     Status of current symptoms: ongoing, no change    Buspar 20mg  po BID   Zoloft 200mg  po qD    Neurontin 400mg  po qHS for EPS vs RLS  Start trial of Lithium CR 300mg  po qD  Unable to use Wellbutrin due to hx of seizures  Pt is trying to start therapy and is awaiting an appointment  Encouraged pt to set up appointment with neurology    Follow Up Instructions: In 8-10 weeks or sooner if needed   I discussed the assessment and treatment plan with the patient. The patient was provided an opportunity to ask questions and all were answered. The patient agreed with the plan and demonstrated an understanding of the instructions.   The patient was advised to call back or seek an in-person evaluation if the symptoms worsen or if the condition fails to improve as anticipated.  I provided 20 minutes of non-face-to-face time during this encounter.   Charlcie Cradle, MD

## 2019-06-21 ENCOUNTER — Telehealth (INDEPENDENT_AMBULATORY_CARE_PROVIDER_SITE_OTHER): Payer: Medicaid Other | Admitting: Psychiatry

## 2019-06-21 ENCOUNTER — Other Ambulatory Visit: Payer: Self-pay

## 2019-06-21 ENCOUNTER — Encounter (HOSPITAL_COMMUNITY): Payer: Self-pay | Admitting: Psychiatry

## 2019-06-21 DIAGNOSIS — F4312 Post-traumatic stress disorder, chronic: Secondary | ICD-10-CM | POA: Diagnosis not present

## 2019-06-21 DIAGNOSIS — F411 Generalized anxiety disorder: Secondary | ICD-10-CM | POA: Diagnosis not present

## 2019-06-21 DIAGNOSIS — F333 Major depressive disorder, recurrent, severe with psychotic symptoms: Secondary | ICD-10-CM | POA: Diagnosis not present

## 2019-06-21 DIAGNOSIS — F17210 Nicotine dependence, cigarettes, uncomplicated: Secondary | ICD-10-CM | POA: Diagnosis not present

## 2019-06-21 MED ORDER — BUSPIRONE HCL 10 MG PO TABS: 20.0000 mg | ORAL_TABLET | Freq: Two times a day (BID) | ORAL | 0 refills | Status: DC

## 2019-06-21 MED ORDER — SERTRALINE HCL 100 MG PO TABS: 200.0000 mg | ORAL_TABLET | ORAL | 0 refills | Status: DC

## 2019-06-21 MED ORDER — LITHIUM CARBONATE ER 450 MG PO TBCR: 450.0000 mg | EXTENDED_RELEASE_TABLET | Freq: Two times a day (BID) | ORAL | 0 refills | Status: DC

## 2019-06-21 MED ORDER — GABAPENTIN 400 MG PO CAPS: 400.0000 mg | ORAL_CAPSULE | Freq: Three times a day (TID) | ORAL | 0 refills | Status: DC

## 2019-06-21 NOTE — Progress Notes (Signed)
Virtual Visit via Telephone Note  I connected with Denise [name] on 06/21/19 at  8:30 AM EDT by telephone and verified that I am speaking with the correct person using two identifiers. Denise [name] does not have access to Internet to do a video session.  Location: Patient: home Provider: office   I discussed the limitations, risks, security and privacy concerns of performing an evaluation and management service by telephone and the availability of in person appointments. I also discussed with the patient that there may be a patient responsible charge related to this service. The patient expressed understanding and agreed to proceed.   History of Present Illness: Denise [name] reports she is not doing well. She is depressed and confused. She is having racing thoughts and worries and she is unable to sleep at night. She gets about 2 hrs of sleep during the day. Denise [name] is having more seizures. She has been taking the meds daily as prescribed. The Neurontin during the day makes her feel heavy and she sleeps all day. At night it does not work. She reports on/off SI without plan or intent. She denies HI. She is having nightmares, flashbacks and intrusive memories. Denise [name] does have paranoia regarding someone hurting her mother.     She is down to 2 cigs/day. Her husband keeps and dispenses them.  I spoke with her husband. He is very frustrated with her memory problems. She is unable to explain or talk about her thoughts or feelings. She is not able to remember anything. He has been trying to help her as much as possible. the PTSD is not helping.   Observations/Objective:  General Appearance: unable to assess  Eye Contact:  unable to assess  Speech:  Clear and Coherent and Slow  Volume:  Normal  Mood:  Anxious and Depressed  Affect:  Congruent  Thought Process:  Coherent and Descriptions of Associations: Circumstantial  Orientation:  Full (Time, Place, and Person)  Thought Content:  Paranoid  Ideation and Rumination  Suicidal Thoughts:  Yes.  without intent/plan  Homicidal Thoughts:  No  Memory:  Immediate;   Poor  Judgement:  Poor  Insight:  Shallow  Psychomotor Activity: unable to assess  Concentration:  Concentration: Poor  Recall:  Poor  Fund of Knowledge:  Fair  Language:  Fair  Akathisia:  unable to assess  Handed:  Right  AIMS (if indicated):     Assets:  Desire for Improvement Financial Resources/Insurance Housing Intimacy Social Support  ADL's:  unable to assess  Cognition:  WNL  Sleep:        I reviewed the information below on 06/21/19 and have updated it  Assessment and Plan:  GAD: MDD-recurrent, severe with psychotic features; PTSD; social anxiety disorder; nicotine use disorder ; r/o RLS vs EPS; r/o Dementia     Status of current symptoms: ongoing symptoms   Buspar 20mg  po BID   Zoloft 200mg  po qD   increase Neurontin 400mg  po TID for EPS vs RLS   Increase Lithium CR 450mg  po qD- reviewed BUN and Creatinine- WNL on 02/28/19   Unable to use Wellbutrin due to hx of seizures   Referred for therapy   Encouraged husband to set up appointment with neurology and     Follow Up Instructions: In 6-8 weeks or sooner if needed   I discussed the assessment and treatment plan with the patient. The patient was provided an opportunity to ask questions and all were answered. The patient agreed with the plan and demonstrated an understanding  of the instructions.   The patient was advised to call back or seek an in-person evaluation if the symptoms worsen or if the condition fails to improve as anticipated.  I provided 20 minutes of non-face-to-face time during this encounter.   Charlcie Cradle, MD

## 2019-06-27 ENCOUNTER — Other Ambulatory Visit: Payer: Self-pay

## 2019-06-27 ENCOUNTER — Ambulatory Visit (INDEPENDENT_AMBULATORY_CARE_PROVIDER_SITE_OTHER): Payer: Medicaid Other | Admitting: Licensed Clinical Social Worker

## 2019-06-27 DIAGNOSIS — F333 Major depressive disorder, recurrent, severe with psychotic symptoms: Secondary | ICD-10-CM

## 2019-06-27 DIAGNOSIS — F411 Generalized anxiety disorder: Secondary | ICD-10-CM | POA: Diagnosis not present

## 2019-06-27 DIAGNOSIS — F17210 Nicotine dependence, cigarettes, uncomplicated: Secondary | ICD-10-CM | POA: Diagnosis not present

## 2019-06-27 DIAGNOSIS — F4312 Post-traumatic stress disorder, chronic: Secondary | ICD-10-CM
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
## 2019-07-04 ENCOUNTER — Ambulatory Visit (INDEPENDENT_AMBULATORY_CARE_PROVIDER_SITE_OTHER): Payer: Medicaid Other | Admitting: Licensed Clinical Social Worker

## 2019-07-04 ENCOUNTER — Other Ambulatory Visit: Payer: Self-pay

## 2019-07-04 DIAGNOSIS — F333 Major depressive disorder, recurrent, severe with psychotic symptoms: Secondary | ICD-10-CM

## 2019-07-04 DIAGNOSIS — F17210 Nicotine dependence, cigarettes, uncomplicated: Secondary | ICD-10-CM

## 2019-07-04 DIAGNOSIS — F4312 Post-traumatic stress disorder, chronic: Secondary | ICD-10-CM

## 2019-07-04 DIAGNOSIS — F411 Generalized anxiety disorder: Secondary | ICD-10-CM | POA: Diagnosis not present

## 2019-07-04 NOTE — Progress Notes (Signed)
Virtual Visit via Telephone Note   I connected with Denise [name] on 07/04/19 at 9:00am by telephone and verified that I am speaking with the correct person using two identifiers.  Denise [name] reported that she does not have a smart phone or internet access, so she is unable to engage in a video session.  Additionally, Denise [name] reported that she does not have reliable transportation and numerous health issues including frequent stress induced seizures, so she prefers telephone calls for her appointments.  Clinician was unable to comment on client's general appearance, hygiene, eye contact or psychomotor activity due to being unable to physically see her in session.  Location: Patient: Patient Home Provider: [facility]    I discussed the limitations, risks, security and privacy concerns of performing an evaluation and management service by telephone and the availability of in person appointments. I also discussed with the patient that there may be a patient responsible charge related to this service. The patient expressed understanding and agreed to proceed.   I discussed the assessment and treatment plan with the patient. The patient was provided an opportunity to ask questions and all were answered. The patient agreed with the plan and demonstrated an understanding of the instructions.   The patient was advised to call back or seek an in-person evaluation if the symptoms worsen or if the condition fails to improve as anticipated.   I provided 30 minutes of non-face-to-face time during this encounter.     [name], LCSW, LCAS ___________________________________ THERAPIST PROGRESS NOTE   Session Time: 9:00am - 9:30am  Location: Patient: Patient Home Provider: [facility]    Participation Level: Active   Behavioral Response: Alert, combined anxious and depressed mood   Type of Therapy:  Individual Therapy   Treatment Goals addressed: Medication management; Depression and  anxiety management    Interventions: CBT   Summary: Denise [name] is a 49 year old African American female who presented for telephone session today with Major Depressive Disorder, Recurrent Severe, with Psychotic Features; Chronic PTSD; GAD; and Cigarette Nicotine Dependence without complication.     Suicidal/Homicidal: None; without plan or intent    Therapist Response: Clinician met with Denise [name] for telephone therapy session today due to inability to meet virtually or in person.  Clinician assessed for safety and medication compliance. Denise [name] answered phone call for appointment on time and was oriented x5, with no evidence or self-report of active SI/HI or A/V H. She reported that she last experienced visual hallucinations yesterday evening of people she did not recognize and denied any command component to the dialogue.  Denise [name] reported that she has continued to take medication as prescribed, but sounded fatigued, and noted that she has been sick since the weekend with headaches, congestion, and low energy.  Clinician inquired about Denise [name]'s current emotional ratings, as well as any significant changes in thoughts, feelings, or behaviors since assessment was done.  Denise [name] reported scores of 8/10 for both depression and anxiety today, and stated "I've had about 3 seizures since the weekend and don't feel good today".  Clinician inquired about whether Denise [name] has contacted her PCP to be assessed based upon these symptoms.  Denise [name] denied this, and noted that she needs to set up an appointment, as well as transport.  Clinician encouraged Denise [name] to get plenty of rest, make sure to eat regularly, and prioritize setting up appointment today to visit with PCP or visit urgent care depending upon her condition.  Clinician also encouraged her to avoid hesitating to  contact 911 if she feels worse.  Denise [name] was agreeable to these suggestions, and reported that she felt like she just needed to rest for  the moment, and gave phone to her husband Denise [name] for further updates.  Denise [name] reported that he has been monitoring her closely, and when they have taken her to be assessed for seizures in the past, little was done to help, and she is already linked with a neurologist.  Denise [name] reported that he has been increasingly stressed by her condition and feels like her mental health is worsening, and he finds it challenging to care for her alone.  Clinician explained that there are ACTT services available in their area which might be helpful for assisting with Denise [name]'s needs based upon her situation, and provided referrals for [facility], [facility], and [facility] to contact about assessment for appropriateness of care.  Denise [name] reported that he would contact them today by phone and consider therapy for himself as well based upon building stress he has been experiencing.  Clinician encouraged Denise [name] to monitor his wife closely and contact 911 if her condition worsens and medical assistance is needed.  Denise [name] was agreeable to this.  Clinician will continue to monitor.     Plan: Follow up again virtually in 1 week.    Diagnosis: Major Depressive Disorder, Recurrent Severe, with Psychotic Features; Chronic PTSD; GAD; and Cigarette Nicotine Dependence without complication   [name], LCSW, LCAS 07/04/19

## 2019-07-06 IMAGING — CT CT HEAD CODE STROKE
3 series · 14 of 47 positions shown, 16 images · non-contrast
Comparison: 12/11/2016

CLINICAL DATA: Code stroke. Focal neuro deficit. Sudden onset of
right-sided weakness.

EXAM:
CT HEAD WITHOUT CONTRAST
TECHNIQUE: Contiguous axial images were obtained from the base of the skull
through the vertex without intravenous contrast.

[Series 2: head 5.0 st · axial · 0.49mm/px · z∈[-118,+17]mm · 8 of 33 slices shown, 10 images]
[im 3/33  brain]
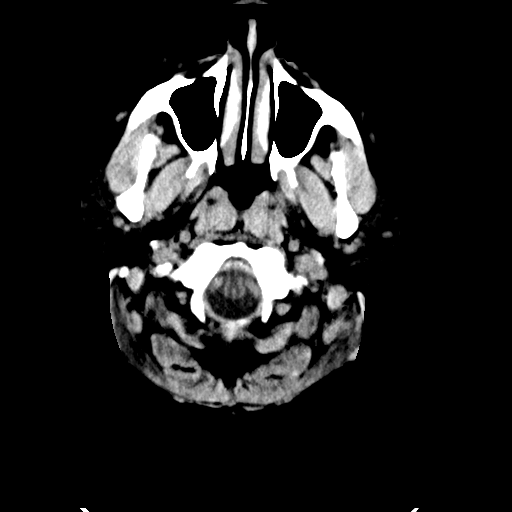
[im 3/33  bone]
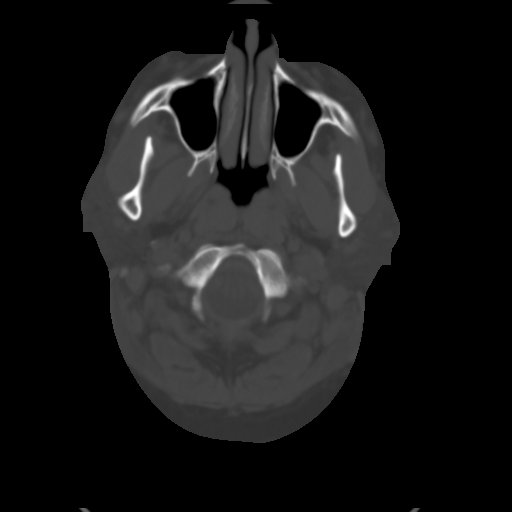
[im 7/33  brain]
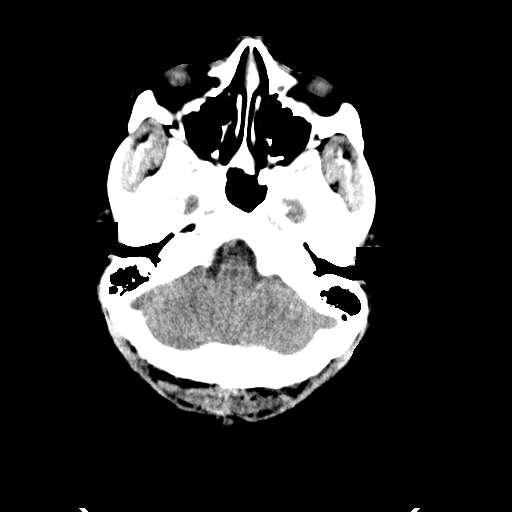
[im 10/33  brain]
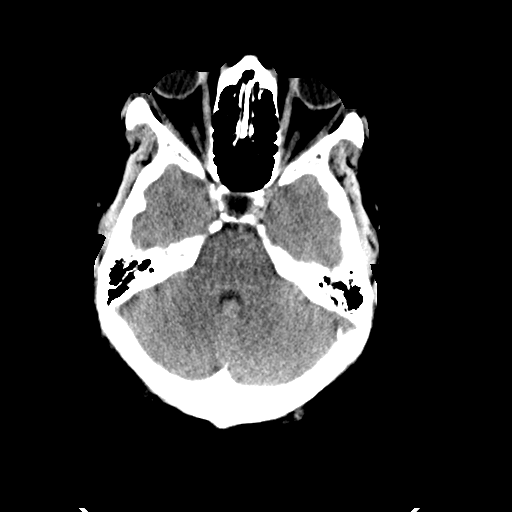
[im 15/33  brain]
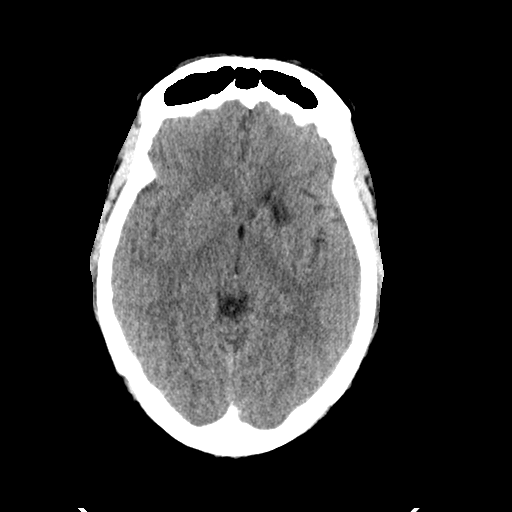
[im 18/33  brain]
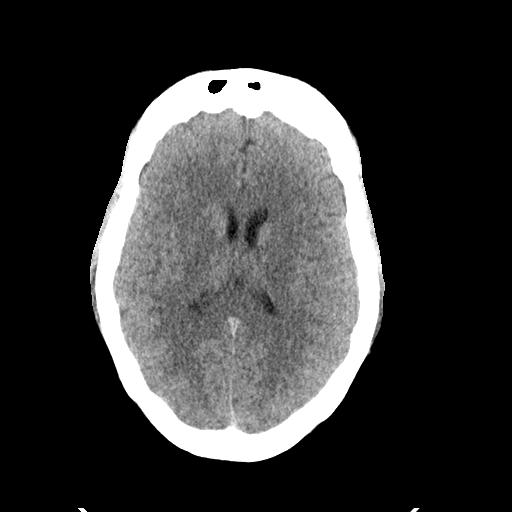
[im 18/33  bone]
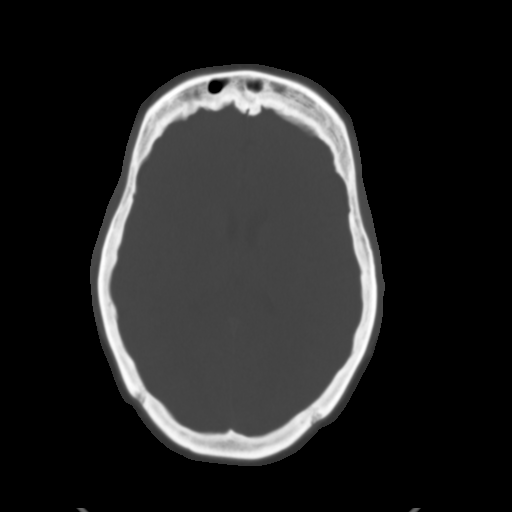
[im 23/33  brain]
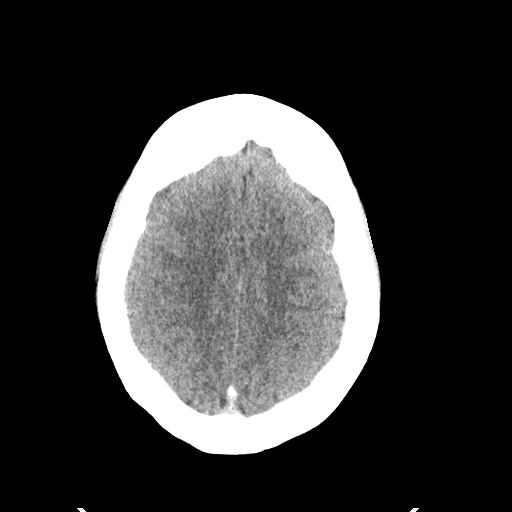
[im 26/33  brain]
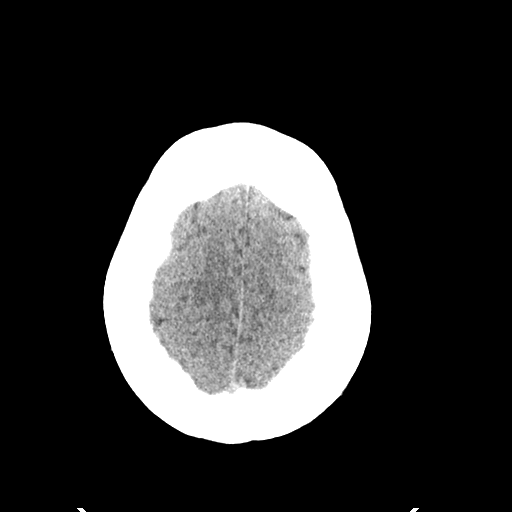
[im 30/33  brain]
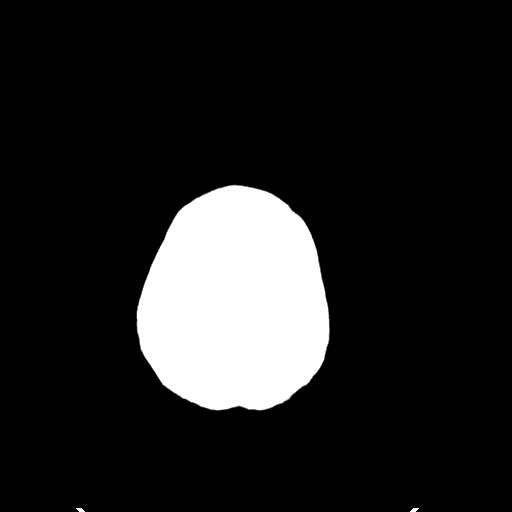

[Series 4: head 3.0 cor st · coronal · 0.32mm/px · 3 of 71 slices shown]
[im 24/71  brain]
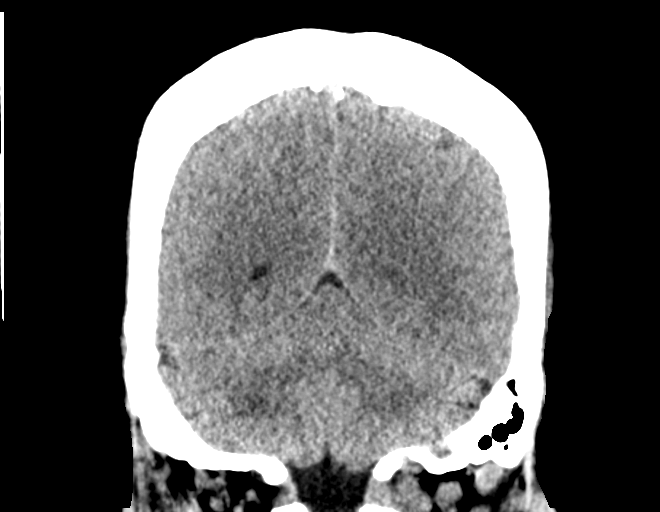
[im 32/71  brain]
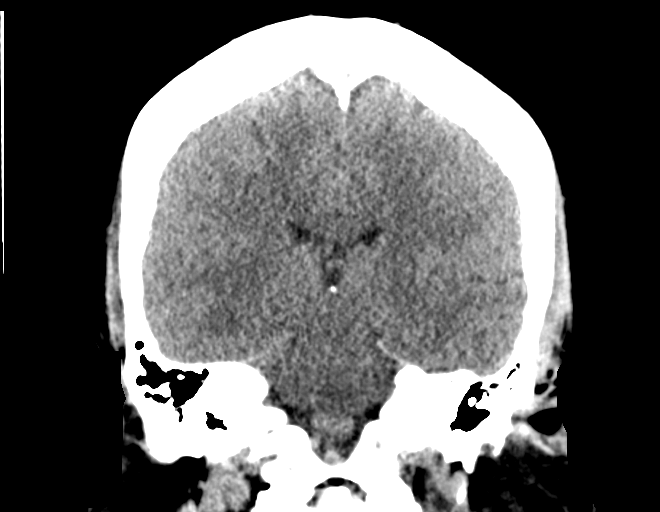
[im 39/71  brain]
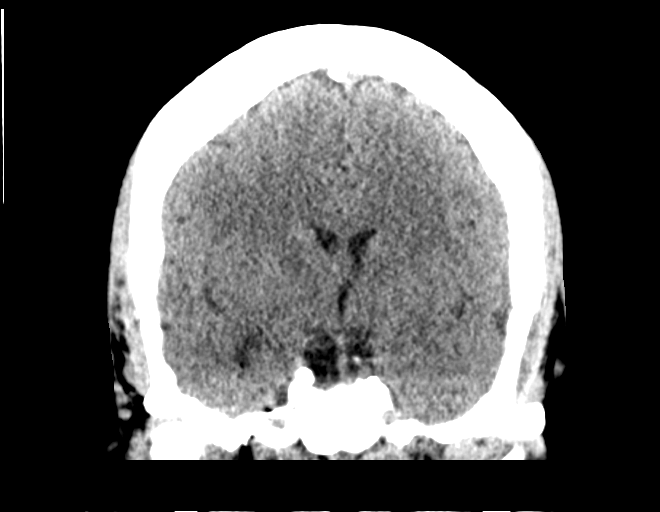

[Series 5: head 3.0 sag st · sagittal · 0.32mm/px · 3 of 64 slices shown]
[im 22/64  brain]
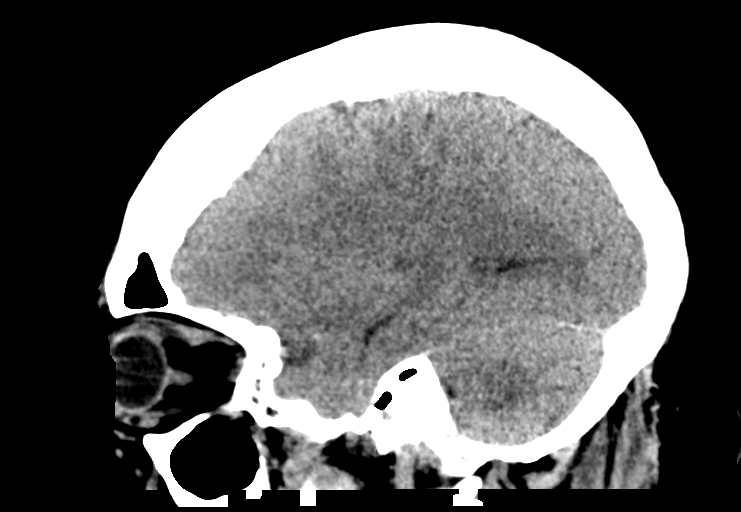
[im 32/64  brain]
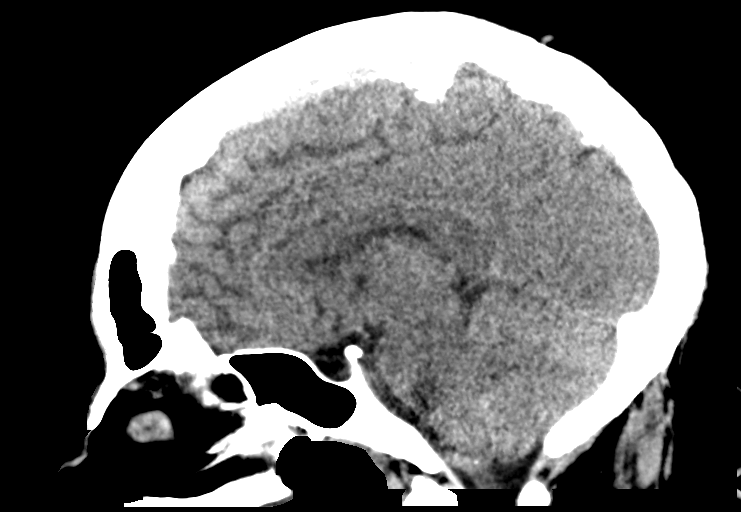
[im 43/64  brain]
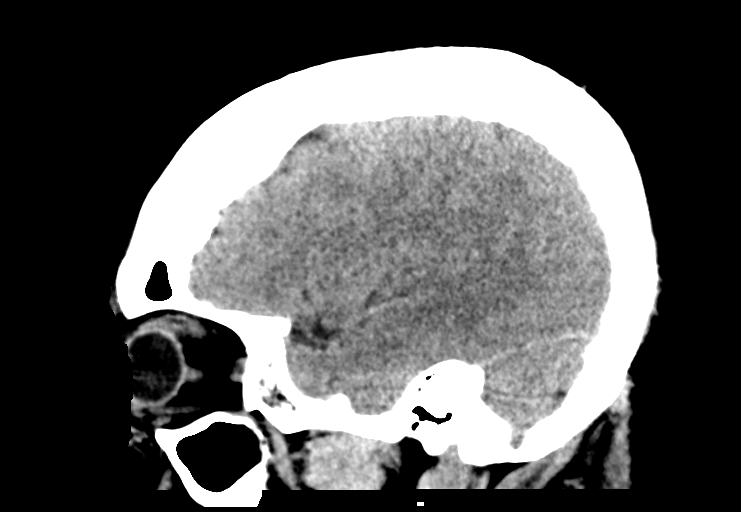

[14 of 47 positions shown; findings below may reference images not displayed]

FINDINGS: Brain: No evidence of acute infarction, hemorrhage, hydrocephalus,
extra-axial collection or mass lesion/mass effect. Remote perforator
infarct affecting the left caudate head and anterior putamen.

Vascular: No hyperdense vessel or unexpected calcification.

Skull: Normal. Negative for fracture or focal lesion.

Sinuses/Orbits: No acute finding.

Other: These results were communicated to Dr. Svetalain at [DATE] [place]
05/19/2017by text page via the [employer] messaging system.

ASPECTS ([facility] Stroke Program Early CT Score)

Not scored in the setting of remote infarct in the left MCA
distribution.
IMPRESSION: 1. No acute finding.  No hemorrhage or visible acute infarct.
2. Remote left basal ganglia infarct.

## 2019-07-11 ENCOUNTER — Other Ambulatory Visit: Payer: Self-pay

## 2019-07-11 ENCOUNTER — Ambulatory Visit (INDEPENDENT_AMBULATORY_CARE_PROVIDER_SITE_OTHER): Payer: Medicaid Other | Admitting: Licensed Clinical Social Worker

## 2019-07-11 DIAGNOSIS — F333 Major depressive disorder, recurrent, severe with psychotic symptoms: Secondary | ICD-10-CM | POA: Diagnosis not present

## 2019-07-11 DIAGNOSIS — F4312 Post-traumatic stress disorder, chronic: Secondary | ICD-10-CM | POA: Diagnosis not present

## 2019-07-11 DIAGNOSIS — F411 Generalized anxiety disorder: Secondary | ICD-10-CM

## 2019-07-11 DIAGNOSIS — F17210 Nicotine dependence, cigarettes, uncomplicated: Secondary | ICD-10-CM

## 2019-07-11 NOTE — Progress Notes (Signed)
Virtual Visit via Telephone Note  I connected withValerie Humphrey-Headenon6/16/21at8:00ambytelephoneand verified that I am speaking with the correct person using two identifiers.Denise [name] reported that she does not have a smart phone or internet access, so she is unable to engage in a video session. Additionally, Denise [name] reported that she does not have reliable transportation and numerous health issues including frequent stress induced seizures, so she prefers telephone calls for her appointments. Clinician wasunable to comment onclient'sgeneral appearance, hygiene, eye contact or psychomotor activity due to beingunable to physically see her in session.  Location: Patient: Patient Home Provider: [facility] [place] [facility]  I discussed the limitations, risks, security and privacy concerns of performing an evaluation and management service by telephone and the availability of in person appointments. I also discussed with the patient that there may be a patient responsible charge related to this service. The patient expressed understanding and agreed to proceed.  I discussed the assessment and treatment plan with the patient. The patient was provided an opportunity to ask questions and all were answered. The patient agreed with the plan and demonstrated an understanding of the instructions.  The patient was advised to call back or seek an in-person evaluation if the symptoms worsen or if the condition fails to improve as anticipated.  I provided30 minutes of non-face-to-face time during this encounter.   [name], LCSW, LCAS ___________________________________ THERAPIST PROGRESS NOTE  Session Time:8:00am-8:30am  Location: Patient: Patient Home Provider: [facility] [place] [facility]   Participation Level:Active  Behavioral Response:Alert, anxious and depressed mood  Type of Therapy: Individual Therapy  Treatment Goals addressed:Medication management;Psychiatry followup;  Depression and anxiety management; Sleep hygiene; Exercise and diet; Smoking cessation  Interventions:CBT  Summary:Denise [name] is a 49 year old African American female whopresented fortelephone session todaywithdiagnoses of Major Depressive Disorder, Recurrent Severe, with Psychotic Features; Chronic PTSD; GAD; and Cigarette Nicotine Dependence without complication.  Suicidal/Homicidal:None;without plan or intent  Therapist Response:Clinician spoke with Denise [name] for telephone appointment today due to inability to meet virtually or in person.  Clinician assessed for safety and medication compliance. Denise [name] answered phone call for this session on time and was oriented x5, with no evidence or self-report of active SI/HI or A/V H. Denise [name] reported that she last experienced visual/auditory hallucinations yesterday, but could not recall specific details, stating "They always go away.  I'm used to it".  Denise [name] reported that she has been taking medication as prescribed with the exception of Lithium, which she discontinued due to negative side effects she was experiencing.  Clinician encouraged Denise [name] to speak with her doctor about this change to determine if the dose could be altered, or another medication might be more appropriate.  Denise [name] agreed to outreach doctor today and stated "I feel better than I was on it, my mood is better".  Clinician inquired about Denise [name]'s emotional ratings today, as well as any significant changes in thoughts, feelings, or behaviors since last check-in.  Denise [name] reported scores of 5/10 for depression and 9/10 for anxiety today.  Clinician inquired about progress that Denise [name] has made towards treatment goals in past week, as well as current challenges.  Denise [name] reported that she plays a game on her phone throughout the day to entertain herself, and feels that this improves her concentration and memory.  She reported that she has not attended a church yet,  but her step son's mother spoke to her about one that they might visit sometime together.  Denise [name] reported that her husband has helped monitor her smoking, and she is down to x3  per day, but she has not been getting much exercise, as she sleeps for at least 10-12 hours daily on average, but still feels tired, and does not get out of the house much.  [name] reported that her anxiety is high today because she looks around the house, and sees things she would like to clean up, but becomes easily overwhelmed.  Clinician suggested making a list of goals Denise [name] would like to accomplish this week, and then attempt to break these down into smaller, more manageable tasks spread out through her weekly schedule in order to increase likelihood of completion and aid in stress reduction.  Denise [name] was agreeable to this, and reported that she needs to do dishes, clean/organize a shelf, go through her drawers and closet to find clothes to donate, then go through some donated clothes she recently received, put them through the laundry, and put them up.  Denise [name] reported that she felt least stressed at the idea of doing dishes and organizing the shelf, so she would plan to do the dishes this morning, rest for awhile, and organize the shelf later this afternoon.  Denise [name] reported that she tends to have memory issues, so she believes keeping a calendar or planner will help her stay accountable to completing the other goals over the course of the next 7 days.  Intervention was effective, as evidenced by Denise [name] reporting that she felt less anxious after discussing this plan, and motivated to be more productive around the house in following days.  Clinician also spoke with Denise [name]'s husband afterward, and encouraged him to follow up on ACTT referrals for additional assistance outside of therapy sessions based upon [name]'s needs.  He was agreeable to this and reported that he had been busy in past week, but would prioritize this,  noting that he continues to feel stressed due to challenge of caring for her.  Clinician will continue to monitor.    Plan:Follow up again virtually in1 week.   Diagnosis: Major Depressive Disorder, Recurrent Severe, with Psychotic Features; Chronic PTSD; GAD; and Cigarette Nicotine Dependence without complication  [name], LCSW, LCAS 07/11/19

## 2019-07-18 ENCOUNTER — Ambulatory Visit (INDEPENDENT_AMBULATORY_CARE_PROVIDER_SITE_OTHER): Payer: Medicaid Other | Admitting: Licensed Clinical Social Worker

## 2019-07-18 ENCOUNTER — Other Ambulatory Visit: Payer: Self-pay

## 2019-07-18 DIAGNOSIS — F4312 Post-traumatic stress disorder, chronic: Secondary | ICD-10-CM

## 2019-07-18 DIAGNOSIS — F333 Major depressive disorder, recurrent, severe with psychotic symptoms: Secondary | ICD-10-CM

## 2019-07-18 DIAGNOSIS — F411 Generalized anxiety disorder: Secondary | ICD-10-CM | POA: Diagnosis not present

## 2019-07-18 DIAGNOSIS — F17210 Nicotine dependence, cigarettes, uncomplicated: Secondary | ICD-10-CM

## 2019-07-18 NOTE — Progress Notes (Signed)
Virtual Visit via Telephone Note   I connected with Denise [name] on 07/18/19 at 9:00am by telephone and verified that I am speaking with the correct person using two identifiers.  Denise [name] reported that she does not have a smart phone or internet access, so she is unable to engage in a video session.  Additionally, Denise [name] reported that she does not have reliable transportation and numerous health issues including frequent stress induced seizures, so she prefers telephone calls for her appointments.  Clinician was unable to comment on client's general appearance, hygiene, eye contact or psychomotor activity due to being unable to physically see her in session.  Location: Patient: Patient Home Provider: [facility] [place] [facility]    I discussed the limitations, risks, security and privacy concerns of performing an evaluation and management service by telephone and the availability of in person appointments. I also discussed with the patient that there may be a patient responsible charge related to this service. The patient expressed understanding and agreed to proceed.   I discussed the assessment and treatment plan with the patient. The patient was provided an opportunity to ask questions and all were answered. The patient agreed with the plan and demonstrated an understanding of the instructions.   The patient was advised to call back or seek an in-person evaluation if the symptoms worsen or if the condition fails to improve as anticipated.   I provided 30 minutes of non-face-to-face time during this encounter.     [name], LCSW, LCAS ___________________________________ THERAPIST PROGRESS NOTE   Session Time: 9:00am - 9:30am  Location: Patient: Patient Home Provider: [facility] [place] [facility]    Participation Level: Active   Behavioral Response: Alert, anxious and depressed mood   Type of Therapy:  Individual Therapy   Treatment Goals addressed: Medication management; Depression and anxiety  management; Exercise and diet; Smoking cessation   Interventions: CBT, mindfulness    Summary: Denise [name] is a 49 year old African American female who presented for telephone session today with diagnoses of Major Depressive Disorder, Recurrent Severe, with Psychotic Features; Chronic PTSD; GAD; and Cigarette Nicotine Dependence without complication.     Suicidal/Homicidal: None; without plan or intent    Therapist Response: Clinician spoke with Denise [name] for telephone appointment today due to inability to meet virtually or in person.  Clinician assessed for safety and medication compliance. Denise [name] answered phone call for this appointment on time and was oriented x5, with no evidence or self-report of active SI/HI or A/V H. Denise [name] reported that she last experienced visual/auditory hallucinations the morning before, but continues to struggle recalling specifics, noting "I just end up forgetting about it after awhile".  Denise [name] reported that she remains compliant with all medications except Lithium, and has not outreached her MD yet about discontinuing this despite clinician's encouragement.  Denise [name] reported that she continues smoking x3 cigarettes daily on average as she works towards cutting back.  Clinician inquired about Denise [name]'s current emotional ratings, as well as any significant changes in thoughts, feelings, or behaviors since last session.  Denise [name] reported scores of 9/10 for depression and 10/10 for anxiety today.  Denise [name] reported that she continues to have panic attacks and seizures regularly, but could not identify specifics, stating "It happens at least once a day I think.  My husband is always around to help".  Clinician inquired about progress Denise [name] has made towards improving productivity and exercise since last session, as well as current challenges.  Denise [name] reported that one success for her was getting dishes done,  but she has not accomplished any of the tasks noted from  previous session, such as cleaning/organizing a shelf, or cleaning/organizing clothing.  Denise [name] stated "I know what I need to do, but for some reason I just can't".  Clinician inquired about whether this was related to lack of motivation influenced by depression, feeling overwhelmed as a result of anxiety, or a combination of both.  Denise [name] reported that she was not sure, but it has become increasingly difficult to get things done, and frustrates her greatly.  Clinician revisited previous suggestions made regarding breaking up goals into smaller tasks, as well as writing down what is discussed in therapy sessions to improve memory retention and increase chances of successful follow-through.  Denise [name] was receptive to this, and noted that cleaning the shelf would likely be the least stressful, most realistic task she could get started on today.  Clinician also offered to demonstrate mindful breathing techniques in session today with Denise [name] to aid in stress reduction so that she will feel less overwhelmed and might see decline in panic episodes/seizures.  Denise [name] was agreeable to trying this exercise, and participated successfully as clinician guided her through process of getting comfortable, achieving a relaxing breathing rhythm, and focusing on this for 10 minutes, allowing distracting thoughts and feelings to be acknowledged, but not ruminated upon.  Intervention was effective, as evidenced by Denise [name] reporting that this exercise made her feel much better afterward, and cleared her mind of worries.  She stated "I feel much lighter.  God bless you", and agreed to try to practice this technique once per day.  Denise [name] reported that she would follow up with her neurologist soon about ongoing memory problems. Clinician reminded Denise [name] to followup on ACTT referrals with her husband for additional assistance and will continue to monitor.      Plan: Follow up again virtually in 1 week.    Diagnosis: Major Depressive  Disorder, Recurrent Severe, with Psychotic Features; Chronic PTSD; GAD; and Cigarette Nicotine Dependence without complication   [name], LCSW, LCAS 07/18/19

## 2019-07-25 ENCOUNTER — Other Ambulatory Visit: Payer: Self-pay

## 2019-07-25 ENCOUNTER — Ambulatory Visit (INDEPENDENT_AMBULATORY_CARE_PROVIDER_SITE_OTHER): Payer: Medicaid Other | Admitting: Licensed Clinical Social Worker

## 2019-07-25 DIAGNOSIS — F333 Major depressive disorder, recurrent, severe with psychotic symptoms: Secondary | ICD-10-CM | POA: Diagnosis not present

## 2019-07-25 DIAGNOSIS — F411 Generalized anxiety disorder: Secondary | ICD-10-CM | POA: Diagnosis not present

## 2019-07-25 DIAGNOSIS — F17210 Nicotine dependence, cigarettes, uncomplicated: Secondary | ICD-10-CM | POA: Diagnosis not present

## 2019-07-25 DIAGNOSIS — F4312 Post-traumatic stress disorder, chronic: Secondary | ICD-10-CM | POA: Diagnosis not present

## 2019-07-25 NOTE — Progress Notes (Signed)
Virtual Visit via Telephone Note   I connected with Denise [name] on 07/25/19 at 8:00am by telephone and verified that I am speaking with the correct person using two identifiers.  Denise [name] reported that she does not have a smart phone or internet access, so she is unable to engage in a video session.  Additionally, Denise [name] reported that she does not have reliable transportation and numerous health issues including frequent stress induced seizures, so she prefers telephone calls for her appointments.  Clinician was unable to comment on client's general appearance, hygiene, eye contact or psychomotor activity due to being unable to physically see her in session.  Location: Patient: Patient Home Provider: [facility] [place] [facility]    I discussed the limitations, risks, security and privacy concerns of performing an evaluation and management service by telephone and the availability of in person appointments. I also discussed with the patient that there may be a patient responsible charge related to this service. The patient expressed understanding and agreed to proceed.   I discussed the assessment and treatment plan with the patient. The patient was provided an opportunity to ask questions and all were answered. The patient agreed with the plan and demonstrated an understanding of the instructions.   The patient was advised to call back or seek an in-person evaluation if the symptoms worsen or if the condition fails to improve as anticipated.   I provided 30 minutes of non-face-to-face time during this encounter.     [name], LCSW, LCAS ___________________________________ THERAPIST PROGRESS NOTE   Session Time: 8:00am - 8:30am  Location: Patient: Patient Home Provider: [facility] [place] [facility]    Participation Level: Active   Behavioral Response: Alert, anxious and depressed mood   Type of Therapy:  Individual Therapy   Treatment Goals addressed: Medication management; Depression and anxiety  management; Exercise and diet   Interventions: CBT, mindfulness, progressive muscle relaxation   Summary: Denise [name] is a 49 year old African American female who presented for telephone session today with diagnoses of Major Depressive Disorder, Recurrent Severe, with Psychotic Features; Chronic PTSD; GAD; and Cigarette Nicotine Dependence without complication.     Suicidal/Homicidal: None; without plan or intent    Therapist Response: Clinician spoke with Denise [name] for telephone session today due to inability to meet virtually or in person and assessed for safety and medication compliance. Denise [name] answered phone call for this session on time and was oriented x5, with no evidence or self-report of active SI/HI or A/V H. Denise [name] reported last experiencing visual/auditory hallucinations 2-3 days ago, stating "I think back to bad things that happened to me in the past and the people that did it".  Denise [name] reported that at times it is difficult to determine whether these are hallucinations, or trauma related flashbacks, but she is still scheduled to meet with neurologist in September for further assistance.  Denise [name] reported that she has been compliant with prescription medications.  Denise [name] reported that she has maintained limited cigarette intake at x3 daily.  Clinician inquired about Denise [name]'s emotional ratings today, as well as any significant changes in thoughts, feelings, or behaviors since previous check-in.  Denise [name] reported scores of 5/10 for both depression and anxiety today and stated "I've been feeling a little better".  Denise [name] reported that she continues to experience panic attacks and seizures regularly, but remains unable to quantify how frequently and her husband continues to be present to assist as needed.  Clinician inquired about progress Denise [name] has made towards goals in last week, as well as  present challenges.  Denise [name] reported that she has had several good days, and has been  practicing mindful breathing technique x3 per day, which has helped keep her calmer, and increase exercise by taking care of tasks around the home that were previously put off, such as cleaning/organizing a shelf, and organizing some clothing items into the closet.  Denise [name] reported that she has to frequently take breaks due to low energy, but finds her motivation increasing.  Clinician praised Denise [name] for her hard work, and inquired about what task(s) she intends to complete next this week.  Denise [name] reported that she will rest today due to fatigue, but plans to go do laundry with her husband over the weekend once they get clothing together in following days.  Denise [name] reported that her most pressing issue she wished to address today is ongoing tension in her body, which she believes contributes to daily headaches.  Clinician offered to teach Denise [name] progressive muscle relaxation exercise today which might assist with alleviation of this tension, and guided her through process of getting comfortable, achieving a relaxing breathing pattern, and then sequentially going through each muscle group of the body (I.e. feet, legs, chest, back, arms, etc), tensing muscles for 5 seconds, and then relaxing them.  Intervention was effective, as evidenced by Denise [name] reporting that she felt decreased pain and tension following participation in activity, and noted that she felt like this would help her fall asleep at night too, as she felt more relaxed overall.  Clinician encouraged Denise [name] to add this to daily self-care routine based upon efficacy and warned her to be wary of straining any muscles that might be in significant pain, since this could lead to injury.  Clinician will continue to monitor.      Plan: Follow up again virtually in 1 week.    Diagnosis: Major Depressive Disorder, Recurrent Severe, with Psychotic Features; Chronic PTSD; GAD; and Cigarette Nicotine Dependence without complication   [name], LCSW,  LCAS 07/25/19

## 2019-07-31 ENCOUNTER — Ambulatory Visit (INDEPENDENT_AMBULATORY_CARE_PROVIDER_SITE_OTHER): Payer: Medicaid Other | Admitting: Licensed Clinical Social Worker

## 2019-07-31 ENCOUNTER — Other Ambulatory Visit: Payer: Self-pay

## 2019-07-31 DIAGNOSIS — F17210 Nicotine dependence, cigarettes, uncomplicated: Secondary | ICD-10-CM | POA: Diagnosis not present

## 2019-07-31 DIAGNOSIS — F4312 Post-traumatic stress disorder, chronic: Secondary | ICD-10-CM | POA: Diagnosis not present

## 2019-07-31 DIAGNOSIS — F333 Major depressive disorder, recurrent, severe with psychotic symptoms: Secondary | ICD-10-CM | POA: Diagnosis not present

## 2019-07-31 DIAGNOSIS — F411 Generalized anxiety disorder: Secondary | ICD-10-CM

## 2019-07-31 NOTE — Progress Notes (Signed)
Virtual Visit via Telephone Note   I connected with Denise [name] on 07/31/19 at 8:00am by telephone and verified that I am speaking with the correct person using two identifiers.  Denise [name] reported that Denise [name] does not have a smart phone or internet access, so Denise [name] is unable to engage in a video session.  Additionally, Denise [name] reported that Denise [name] does not have reliable transportation and numerous health issues including frequent stress induced seizures, so Denise [name] prefers telephone calls for Denise [name] appointments.  Clinician was unable to comment on client's general appearance, hygiene, eye contact or psychomotor activity due to being unable to physically see Denise [name] in session.  Location: Patient: Patient Home Provider: [facility] [place] [facility]    I discussed the limitations, risks, security and privacy concerns of performing an evaluation and management service by telephone and the availability of in person appointments. I also discussed with the patient that there may be a patient responsible charge related to this service. The patient expressed understanding and agreed to proceed.   I discussed the assessment and treatment plan with the patient. The patient was provided an opportunity to ask questions and all were answered. The patient agreed with the plan and demonstrated an understanding of the instructions.   The patient was advised to call back or seek an in-person evaluation if the symptoms worsen or if the condition fails to improve as anticipated.   I provided 30 minutes of non-face-to-face time during this encounter.     [name], LCSW, LCAS ___________________________________ THERAPIST PROGRESS NOTE   Session Time: 8:00am - 8:30am  Location: Patient: Patient Home Provider: [facility]    Participation Level: Active   Behavioral Response: Alert, euthymic mood   Type of Therapy:  Individual Therapy   Treatment Goals addressed: Medication management; Depression and anxiety management;  Exercise/productivity   Interventions: CBT, 5-4-3-2-1 grounding    Summary: Denise [name] is a 49 year old African American female who presented for telephone session today with diagnoses of Major Depressive Disorder, Recurrent Severe, with Psychotic Features; Chronic PTSD; GAD; and Cigarette Nicotine Dependence without complication.     Suicidal/Homicidal: None; without plan or intent    Therapist Response: Clinician spoke with Denise [name] for telephone session today due to inability to meet virtually or in person and assessed for safety and medication compliance. Denise [name] answered phone call for this appointment on time and was alert, oriented x5, with no evidence or self-report of active SI/HI or A/V H. Denise [name] reported that Denise [name] last experienced hallucination of Denise [name] deceased brother in law 2 days ago which Denise [name] found distressing, and stated "He was talking in my ear, but I couldn't understand him".  Denise [name] reported that Denise [name] was able to use mindful breathing exercise to stay calm, and this hallucination shortly went away.  Denise [name] reported that Denise [name] remains compliant with medications.  Denise [name] reported limiting herself to x3 cigarettes daily.  Clinician inquired about Denise [name] current emotional ratings, as well as any significant changes in thoughts, feelings, or behaviors since last check-in.  Denise [name] reported scores of 3/10 for depression and 4/10 for anxiety today and stated "Things are okay today".  Denise [name] reported that Denise [name] experienced 2 seizures yesterday, and is averaging roughly the same amount of panic attacks, but lacks insight into what triggers these episodes.  Denise [name] reported that Denise [name] husband remains nearby to assist as needed during episodes.  Clinician inquired about progress Denise [name] has made towards self-care routine in past week, as well as balancing this with increased productivity around the home.  Denise [name] reported that Denise [name] continues to use breathing technique x5 per day on average, in  addition to addition of progressive muscle relaxation, which has improved mood, reduced pain, and allowed Denise [name] to focus on daily activities, which helped Denise [name] go to the grocery store over the weekend, prepare a nice meal, and then later take care of cleaning tasks like dishes, making the bed, and vacuuming.  Tema acknowledged that Denise [name] needs to pace herself, and when fatigued Denise [name] mind "Candis Musa off sometimes".  Rolande reported that increasing focus in moments like this is important for Denise [name], so clinician offered to demonstrate 5-4-3-2-1 grounding technique to Denise [name] today to help Denise [name] harness all 5 senses in order to anchor Denise [name] more effectively to present moment.  Danine was agreeable to this, and clinician tasked Denise [name] with identifying 5 things Denise [name] could see, 4 things Denise [name] could touch, 3 things Denise [name] could hear, 2 things Denise [name] could smell, and 1 thing Denise [name] could taste in surrounding environment at home.  Intervention was effective, as evidenced by Denise [name] successfully utilizing this technique to improve focus by reducing distracting thoughts, and stated "I like it and plan to use it".  Clinician encouraged Denise [name] to add this to skillset, practice often, and will continue to monitor.      Plan: Follow up again virtually in 1 week.    Diagnosis: Major Depressive Disorder, Recurrent Severe, with Psychotic Features; Chronic PTSD; GAD; and Cigarette Nicotine Dependence without complication   [name], LCSW, LCAS 07/31/19

## 2019-08-01 ENCOUNTER — Other Ambulatory Visit (HOSPITAL_BASED_OUTPATIENT_CLINIC_OR_DEPARTMENT_OTHER): Payer: Self-pay

## 2019-08-01 DIAGNOSIS — R0683 Snoring: Secondary | ICD-10-CM

## 2019-08-01 DIAGNOSIS — R0681 Apnea, not elsewhere classified: Secondary | ICD-10-CM

## 2019-08-02 ENCOUNTER — Telehealth (HOSPITAL_COMMUNITY): Payer: Medicaid Other | Admitting: Psychiatry

## 2019-08-02 ENCOUNTER — Telehealth (HOSPITAL_COMMUNITY): Payer: Self-pay | Admitting: Psychiatry

## 2019-08-02 ENCOUNTER — Other Ambulatory Visit: Payer: Self-pay

## 2019-08-02 NOTE — Telephone Encounter (Signed)
I called Denise [name] at our scheduled appointment time today.  There was no answer and I left a voice message asking her to call the clinic back when available.  I was unable to speak with [name] today.

## 2019-08-07 ENCOUNTER — Ambulatory Visit (INDEPENDENT_AMBULATORY_CARE_PROVIDER_SITE_OTHER): Payer: Medicaid Other | Admitting: Licensed Clinical Social Worker

## 2019-08-07 ENCOUNTER — Other Ambulatory Visit: Payer: Self-pay

## 2019-08-07 DIAGNOSIS — F411 Generalized anxiety disorder: Secondary | ICD-10-CM | POA: Diagnosis not present

## 2019-08-07 DIAGNOSIS — F333 Major depressive disorder, recurrent, severe with psychotic symptoms: Secondary | ICD-10-CM | POA: Diagnosis not present

## 2019-08-07 DIAGNOSIS — F17210 Nicotine dependence, cigarettes, uncomplicated: Secondary | ICD-10-CM | POA: Diagnosis not present

## 2019-08-07 DIAGNOSIS — F4312 Post-traumatic stress disorder, chronic: Secondary | ICD-10-CM | POA: Diagnosis not present

## 2019-08-07 NOTE — Progress Notes (Signed)
Virtual Visit via Telephone Note   I connected with Denise [name] on 08/07/19 at 8:00am by telephone and verified that I am speaking with the correct person using two identifiers.  Denise [name] reported that she does not have a smart phone or internet access, so she is unable to engage in a video session.  Additionally, Denise [name] reported that she does not have reliable transportation and numerous health issues including frequent stress induced seizures, so she prefers telephone calls for her appointments.  Clinician was unable to comment on client's general appearance, hygiene, eye contact or psychomotor activity due to being unable to physically see her in session.  Location: Patient: Patient Home Provider: [facility] [place] [facility]    I discussed the limitations, risks, security and privacy concerns of performing an evaluation and management service by telephone and the availability of in person appointments. I also discussed with the patient that there may be a patient responsible charge related to this service. The patient expressed understanding and agreed to proceed.   I discussed the assessment and treatment plan with the patient. The patient was provided an opportunity to ask questions and all were answered. The patient agreed with the plan and demonstrated an understanding of the instructions.   The patient was advised to call back or seek an in-person evaluation if the symptoms worsen or if the condition fails to improve as anticipated.   I provided 30 minutes of non-face-to-face time during this encounter.     [name], LCSW, LCAS ___________________________________ THERAPIST PROGRESS NOTE   Session Time: 8:00am - 8:30am   Location: Patient: Patient Home Provider: [facility] [place] [facility]    Participation Level: Active   Behavioral Response: Alert, depressed mood   Type of Therapy:  Individual Therapy   Treatment Goals addressed: Medication management; Depression and anxiety management;  Exercise/productivity   Interventions: CBT, mental grounding techniques    Summary: Denise [name] is a 49 year old African American female who presented for telephone session today with diagnoses of Major Depressive Disorder, Recurrent Severe, with Psychotic Features; Chronic PTSD; GAD; and Cigarette Nicotine Dependence without complication.     Suicidal/Homicidal: None; without plan or intent    Therapist Response: Clinician spoke with Denise [name] for telephone appointment today due to inability to meet virtually or in person and assessed for safety and medication compliance. Denise [name] answered phone call for this session on time and was alert, oriented x5, with no evidence or self-report of active SI/HI or A/V H. Denise [name] reported that she last experienced hallucination this morning when she saw a person she did not recognize in the home, but was aware that they did not exist, did not interact with them, and it disappeared after she ignored it for a few minutes.  Denise [name] reported that she continues taking medication as prescribed, and limits herself to x3 cigarettes daily with assistance from husband.  Clinician inquired about Denise [name]'s emotional ratings today, as well as any significant changes in thoughts, feelings, or behaviors since previous check-in.  Denise [name] reported scores of 9/10 for both depression and anxiety today and stated "I'm just not feeling very well this morning".  Denise [name] reported that despite how she felt, she still wished to engage in session, stating "I always feel better after therapy".  Denise [name] reported that the last time she had a panic attack was yesterday afternoon, and she is having seizures roughly x1 per day, although her husband continues to monitor her closely in case medical assistance is needed.  Clinician inquired about progress Denise [name] has  made towards goals in past week, as well as current challenges.  Denise [name] reported that she continues using relaxation skills daily  to stay calm, and believes this has helped reduce episodes and increase productivity, stating "I did some laundry, cleaned the kitchen, and straightened up a bit while I had the energy".  Denise [name] reported that she wished to learn more skills today to assist with grounding.  Clinician covered topic of mental grounding techniques with Denise [name] today involving using the mind to distract oneself temporarily from troubling thoughts or feelings.  Some of the techniques covered included playing a 'categories' game with oneself, describing an everyday activity in great detail, imagining a pleasant/comforting mental image, reading something engrossing very slowly, using humor, and counting to 10 or A-Z of the alphabet.  Interventions were effective, as evidenced by Denise [name] participating in trying each of these techniques out, and noting that she found thinking of categories of cartoons/characters to be helpful, in addition to reading from the bible, imagining being on the [employer] ride at [employer], thinking of her husband comforting her, or reading funny jokes.  Denise [name] reported that she would plan to practice these daily, and stated "I already feel a little better".  Clinician will continue to monitor.      Plan: Follow up again virtually in 1 week.    Diagnosis: Major Depressive Disorder, Recurrent Severe, with Psychotic Features; Chronic PTSD; GAD; and Cigarette Nicotine Dependence without complication   [name], LCSW, LCAS 08/07/19

## 2019-08-09 ENCOUNTER — Telehealth (HOSPITAL_COMMUNITY): Payer: Medicaid Other | Admitting: Psychiatry

## 2019-08-09 ENCOUNTER — Other Ambulatory Visit: Payer: Self-pay

## 2019-08-09 ENCOUNTER — Encounter (HOSPITAL_COMMUNITY): Payer: Self-pay | Admitting: Psychiatry

## 2019-08-09 DIAGNOSIS — F333 Major depressive disorder, recurrent, severe with psychotic symptoms: Secondary | ICD-10-CM

## 2019-08-09 DIAGNOSIS — F4312 Post-traumatic stress disorder, chronic: Secondary | ICD-10-CM

## 2019-08-09 DIAGNOSIS — F411 Generalized anxiety disorder: Secondary | ICD-10-CM

## 2019-08-09 DIAGNOSIS — G4701 Insomnia due to medical condition: Secondary | ICD-10-CM

## 2019-08-09 NOTE — Progress Notes (Signed)
Virtual Visit via Telephone Note  I connected with Denise [name] on 08/09/19 at 11:15 AM EDT by telephone and verified that I am speaking with the correct person using two identifiers.  Location: Patient: home Provider: office   I discussed the limitations, risks, security and privacy concerns of performing an evaluation and management service by telephone and the availability of in person appointments. I also discussed with the patient that there may be a patient responsible charge related to this service. The patient expressed understanding and agreed to proceed.   History of Present Illness: "I am in and out and in and out. I still don't know why". At times she does better than others. She continues to have headaches and states the Neurontin and Aleve are not controlling her pain. On bad days her depression lasts for hours. At that time she feels lost in her mind and is unable to concentrate. She often cries due to pain and poor memory. Her sleep remains poor.  She has a sleep study coming up on July 29th. Denise [name] denies SI. She has random thoughts about wanting to hurt non-specific people. She denies any thoughts about killing anyone and states she stays at home most of the time. Denise [name] feels her anxiety is overwhelming.    Observations/Objective:  General Appearance: unable to assess  Eye Contact:  unable to assess  Speech:  Clear and Coherent and Normal Rate  Volume:  Normal  Mood:  Depressed  Affect:  Congruent  Thought Process:  Goal Directed, Linear, and Descriptions of Associations: Intact  Orientation:  Full (Time, Place, and Person)  Thought Content:  Logical  Suicidal Thoughts:  No  Homicidal Thoughts:  Yes.  without intent/plan  Memory:  Immediate;   Good  Judgement:  Good  Insight:  Good  Psychomotor Activity: unable to assess  Concentration:  Concentration: Good  Recall:  Good  Fund of Knowledge:  Good  Language:  Good  Akathisia:  unable to assess   Handed:  Right  AIMS (if indicated):     Assets:  Communication Skills Desire for Improvement Financial Resources/Insurance Housing Talents/Skills Transportation Vocational/Educational  ADL's:  unable to assess  Cognition:  WNL  Sleep:        I reviewed the information below on 08/09/19 and have updated it. Assessment and Plan:  GAD: MDD-recurrent, severe with psychotic features; PTSD; social anxiety disorder; nicotine use disorder ; r/o RLS vs EPS; r/o Dementia     Status of current symptoms: no change   Buspar 20mg  po BID   Zoloft 200mg  po qD  Qtc 503 on 02/28/19- will monitor for change Restart [name] 1mg  po qD as she reports good benefit in the past   D/c Neurontin as she is reporting no benefit and continues to experience EPS  Increase Lithium CR 600mg  po qD- reviewed  BUN and Creatinine- WNL on 02/28/19   Unable to use Wellbutrin due to hx of seizures   Encouraged to conitnue therapy- reports it is weekly   appointment with neurology for memory, HA and EPS vs RLS- on Sept 20th  PCP appt on Aug 6th, sleep study on July 29th   Follow Up Instructions: In 4-8 weeks or sooner if needed   I discussed the assessment and treatment plan with the patient. The patient was provided an opportunity to ask questions and all were answered. The patient agreed with the plan and demonstrated an understanding of the instructions.   The patient was advised to call back or seek  an in-person evaluation if the symptoms worsen or if the condition fails to improve as anticipated.  I provided 25 minutes of non-face-to-face time during this encounter.   Oletta Darter, MD

## 2019-08-14 ENCOUNTER — Ambulatory Visit (INDEPENDENT_AMBULATORY_CARE_PROVIDER_SITE_OTHER): Payer: Medicaid Other | Admitting: Licensed Clinical Social Worker

## 2019-08-14 ENCOUNTER — Other Ambulatory Visit: Payer: Self-pay

## 2019-08-14 DIAGNOSIS — F333 Major depressive disorder, recurrent, severe with psychotic symptoms: Secondary | ICD-10-CM | POA: Diagnosis not present

## 2019-08-14 DIAGNOSIS — F411 Generalized anxiety disorder: Secondary | ICD-10-CM

## 2019-08-14 DIAGNOSIS — F17210 Nicotine dependence, cigarettes, uncomplicated: Secondary | ICD-10-CM | POA: Diagnosis not present

## 2019-08-14 DIAGNOSIS — F4312 Post-traumatic stress disorder, chronic: Secondary | ICD-10-CM | POA: Diagnosis not present
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
## 2019-08-21 ENCOUNTER — Ambulatory Visit (INDEPENDENT_AMBULATORY_CARE_PROVIDER_SITE_OTHER): Payer: Medicaid Other | Admitting: Licensed Clinical Social Worker

## 2019-08-21 ENCOUNTER — Other Ambulatory Visit: Payer: Self-pay

## 2019-08-21 DIAGNOSIS — F4312 Post-traumatic stress disorder, chronic: Secondary | ICD-10-CM | POA: Diagnosis not present

## 2019-08-21 DIAGNOSIS — F411 Generalized anxiety disorder: Secondary | ICD-10-CM | POA: Diagnosis not present

## 2019-08-21 DIAGNOSIS — F333 Major depressive disorder, recurrent, severe with psychotic symptoms: Secondary | ICD-10-CM | POA: Diagnosis not present

## 2019-08-21 DIAGNOSIS — F17210 Nicotine dependence, cigarettes, uncomplicated: Secondary | ICD-10-CM | POA: Diagnosis not present

## 2019-08-21 NOTE — Progress Notes (Signed)
Virtual Visit via Telephone Note  I connected withValerie [name]-Headenon7/27/21at 8:00ambytelephoneand verified that I am speaking with the correct person using two identifiers.Denise [name] reported that she does not have a smart phone or internet access, so she is unable to engage in a video session. Additionally, Denise [name] reported that she does not have reliable transportation and numerous health issues including frequent stress induced seizures, so she prefers telephone calls for her appointments. Clinician wasunable to comment onclient'sgeneral appearance, hygiene, eye contact or psychomotor activity due to beingunable to physically see her in session.  I discussed the limitations, risks, security and privacy concerns of performing an evaluation and management service by telephone and the availability of in person appointments. I also discussed with the patient that there may be a patient responsible charge related to this service. The patient expressed understanding and agreed to proceed.  I discussed the assessment and treatment plan with the patient. The patient was provided an opportunity to ask questions and all were answered. The patient agreed with the plan and demonstrated an understanding of the instructions.  The patient was advised to call back or seek an in-person evaluation if the symptoms worsen or if the condition fails to improve as anticipated.  I provided30 minutes of non-face-to-face time during this encounter.   [name], LCSW, LCAS ___________________________________ THERAPIST PROGRESS NOTE  Session Time:8:00am-8:30am  Location: Patient: Patient Home Provider: [facility] [place] [facility]   Participation Level:Active  Behavioral Response:Alert, anxious and depressedmood  Type of Therapy: Individual Therapy  Treatment Goals addressed:Medication management;Depression and anxiety management; Exercise/productivity  Interventions:CBT,  self-soothing grounding techniques  Summary:Denise [name] is a 49 year old African American female whopresented fortelephone session todaywithdiagnoses ofMajor Depressive Disorder, Recurrent Severe, with Psychotic Features; Chronic PTSD; GAD; and Cigarette Nicotine Dependence without complication.  Suicidal/Homicidal:None;without plan or intent  Therapist Response:Clinicianspokewith Denise [name] telephone session due to inability to meet virtually or in person and assessed for safetyand medication compliance. Denise [name] answered this phone call for session on time and spoke in a manner that was alert, oriented x5, with no evidence or self-report of active SI/HI or A/V H. Denise [name] reported last experiencing auditory hallucination yesterday morning when she heard unrecognizable chatter for roughly 1 hour.  Denise [name] reported that she continues taking medication as prescribed and using distraction techniques to cope.  Denise [name] denied use of alcohol or illicit substances and continues limiting her cigarette use to x3 daily.  Clinician inquired about Denise [name]'s emotional ratings today, as well as any significant changes in thoughts, feelings, or behaviors since previous check-in. Denise [name] reported scores of8/10 for both depression and anxiety today and stated "I haven't been feeling as well this week.  I don't have a lot of energy".  Denise [name] reported that she had one seizure about 3 days ago and continues to be monitored by her husband for safety during these episodes.  She reported that due to feeling dizzy more often she hasn't been as productive, but was able to fold some laundry yesterday before she needed to lie down.  Denise [name] reported that she still intends to keep her neurology appointment on September 20th to address related issues.  Clinician continued discussion of grounding techniques today with Denise [name] by covering self-soothing approaches, which included examples such as saying  kind/coping statements to oneself, thinking of favorite things, picturing people one cares about, reciting positive poems/songs/quotations, or planning positive activities to look forward to.  Interventions were effective, as evidenced by Denise [name] participating in discussion and reporting that several of these seemed helpful for distraction, including  saying coping statement such as "God is good all the time and things will improve if you put your trust in him", visualizing her husband's presence, thinking of uplifting gospel songs/lyrics, planning for a warm bath to relax, or planning trips to get out of the house for a change of scenery.  Denise [name] reported that she did not have enough energy to engage in remainder of session, and would follow up next week as scheduled.  Clinician encouraged Denise [name] to continue daily practice of therapy techniques to aid in mood stabilization, seek medical assistance should condition worsen, and will continue to monitor.  Plan:Follow up again virtually in1 week.   Diagnosis: Major Depressive Disorder, Recurrent Severe, with Psychotic Features; Chronic PTSD; GAD; and Cigarette Nicotine Dependence without complication  [name], LCSW, LCAS 08/21/19

## 2019-08-23 ENCOUNTER — Ambulatory Visit (HOSPITAL_BASED_OUTPATIENT_CLINIC_OR_DEPARTMENT_OTHER): Payer: Medicaid Other | Attending: Family Medicine | Admitting: Internal Medicine

## 2019-08-23 ENCOUNTER — Other Ambulatory Visit: Payer: Self-pay

## 2019-08-23 DIAGNOSIS — G4736 Sleep related hypoventilation in conditions classified elsewhere: Secondary | ICD-10-CM | POA: Insufficient documentation

## 2019-08-23 DIAGNOSIS — Z7984 Long term (current) use of oral hypoglycemic drugs: Secondary | ICD-10-CM | POA: Insufficient documentation

## 2019-08-23 DIAGNOSIS — R0683 Snoring: Secondary | ICD-10-CM | POA: Diagnosis present

## 2019-08-23 DIAGNOSIS — R0681 Apnea, not elsewhere classified: Secondary | ICD-10-CM

## 2019-08-23 DIAGNOSIS — G4733 Obstructive sleep apnea (adult) (pediatric): Secondary | ICD-10-CM | POA: Diagnosis not present

## 2019-08-23 DIAGNOSIS — Z79899 Other long term (current) drug therapy: Secondary | ICD-10-CM | POA: Insufficient documentation

## 2019-08-29 ENCOUNTER — Other Ambulatory Visit: Payer: Self-pay

## 2019-08-29 ENCOUNTER — Ambulatory Visit (INDEPENDENT_AMBULATORY_CARE_PROVIDER_SITE_OTHER): Payer: Medicaid Other | Admitting: Licensed Clinical Social Worker

## 2019-08-29 DIAGNOSIS — F17210 Nicotine dependence, cigarettes, uncomplicated: Secondary | ICD-10-CM | POA: Diagnosis not present

## 2019-08-29 DIAGNOSIS — F411 Generalized anxiety disorder: Secondary | ICD-10-CM | POA: Diagnosis not present

## 2019-08-29 DIAGNOSIS — F333 Major depressive disorder, recurrent, severe with psychotic symptoms: Secondary | ICD-10-CM | POA: Diagnosis not present

## 2019-08-29 DIAGNOSIS — F4312 Post-traumatic stress disorder, chronic: Secondary | ICD-10-CM | POA: Diagnosis not present

## 2019-08-29 NOTE — Progress Notes (Signed)
Virtual Visit via Telephone Note   I connected with Denise [name] on 08/29/19 at 8:00am by telephone and verified that I am speaking with the correct person using two identifiers.  Denise [name] reported that she does not have a smart phone or internet access, so she is unable to engage in a video session.  Additionally, Denise [name] reported that she does not have reliable transportation and numerous health issues including frequent stress induced seizures, so she prefers telephone calls for her appointments.  Clinician was unable to comment on client's general appearance, hygiene, eye contact or psychomotor activity due to being unable to physically see her in session.  I discussed the limitations, risks, security and privacy concerns of performing an evaluation and management service by telephone and the availability of in person appointments. I also discussed with the patient that there may be a patient responsible charge related to this service. The patient expressed understanding and agreed to proceed.   I discussed the assessment and treatment plan with the patient. The patient was provided an opportunity to ask questions and all were answered. The patient agreed with the plan and demonstrated an understanding of the instructions.   The patient was advised to call back or seek an in-person evaluation if the symptoms worsen or if the condition fails to improve as anticipated.   I provided 30 minutes of non-face-to-face time during this encounter.     Denise Flood, LCSW, LCAS ___________________________________ THERAPIST PROGRESS NOTE   Session Time: 8:00am - 8:30am   Location: Patient: Patient Home Provider: [facility] [place] [facility]    Participation Level: Active   Behavioral Response: Alert, anxious and depressed mood   Type of Therapy:  Individual Therapy   Treatment Goals addressed: Medication management; Depression and anxiety management; Exercise/productivity   Interventions: CBT, problem  solving, guided imagery   Summary: Denise [name] is a 49 year old African American female who presented for telephone session today with diagnoses of Major Depressive Disorder, Recurrent Severe, with Psychotic Features; Chronic PTSD; GAD; and Cigarette Nicotine Dependence without complication.     Suicidal/Homicidal: None; without plan or intent    Therapist Response: Clinician spoke with Denise [name] for telephone appointment due to inability to meet virtually or in person and assessed for safety, sobriety, and medication compliance. Denise [name] answered phone call for appointment on time and spoke in a manner that was alert, oriented x5, with no evidence or self-report of active SI/HI or A/V H. Denise [name] reported that she last experienced auditory hallucination yesterday afternoon when she her someone talking, but her husband denied hearing this, and she distracted herself until it passed over course of 5 minutes.  Denise [name] reported that she remains compliant with medication and denied use of alcohol or illicit substances.  Denise [name] reported that she is still averaging x3 cigarettes daily.  Clinician inquired about Denise [name]'s current emotional ratings, as well as any significant changes in thoughts, feelings, or behaviors since last check-in.  [name] reported scores of 5/10 for both depression and anxiety today and noted that although she experienced one seizure yesterday, they do seem to have decreased as a result of using relaxation techniques more often.  Clinician inquired about Denise [name]'s progress towards goals in past week, as well as present challenges.  Denise [name] reported that she visited her mother in law, did some cleaning around the house while she had energy, and still plans to attend doctor's appointment on Friday.  Denise [name] reported that one challenge has been identifying somewhere else to live, stating "We have been in  a motel for 5 years and I feel like this is affecting my mind".  Clinician  discussed strategies for finding housing for Denise [name] and her husband, including networking with her support system to find out about any rental properties they might be aware of, looking online at [employer] around [place] for housing in her price range, and providing referral to [place].  Clinician also performed guided imagery exercise with Denise [name] today to aid in relaxation over course of 10 minutes.  Interventions were effective, as evidenced by Denise [name] reporting that discussing housing resources gave her new ideas on how to tackle this issue, and successfully participating in relaxation activity and stating "I feel a lot calmer and I'm not as anxious".  Clinician will continue to monitor.      Plan: Follow up again virtually in 1 week.    Diagnosis: Major Depressive Disorder, Recurrent Severe, with Psychotic Features; Chronic PTSD; GAD; and Cigarette Nicotine Dependence without complication   Denise Flood, LCSW, LCAS 08/29/19

## 2019-08-30 ENCOUNTER — Other Ambulatory Visit: Payer: Self-pay

## 2019-08-30 ENCOUNTER — Telehealth (INDEPENDENT_AMBULATORY_CARE_PROVIDER_SITE_OTHER): Payer: Medicaid Other | Admitting: Psychiatry

## 2019-08-30 DIAGNOSIS — F4010 Social phobia, unspecified: Secondary | ICD-10-CM

## 2019-08-30 DIAGNOSIS — F333 Major depressive disorder, recurrent, severe with psychotic symptoms: Secondary | ICD-10-CM | POA: Diagnosis not present

## 2019-08-30 DIAGNOSIS — F4312 Post-traumatic stress disorder, chronic: Secondary | ICD-10-CM | POA: Diagnosis not present

## 2019-08-30 DIAGNOSIS — F411 Generalized anxiety disorder: Secondary | ICD-10-CM | POA: Diagnosis not present

## 2019-08-30 DIAGNOSIS — G4701 Insomnia due to medical condition: Secondary | ICD-10-CM

## 2019-08-30 MED ORDER — BUSPIRONE HCL 10 MG PO TABS: 20.0000 mg | ORAL_TABLET | Freq: Two times a day (BID) | ORAL | 0 refills | Status: DC

## 2019-08-30 MED ORDER — SERTRALINE HCL 100 MG PO TABS: 200.0000 mg | ORAL_TABLET | ORAL | 0 refills | Status: DC

## 2019-08-30 MED ORDER — REXULTI 0.5 MG PO TABS: 0.5000 mg | ORAL_TABLET | Freq: Every day | ORAL | 1 refills | Status: DC

## 2019-08-30 MED ORDER — LITHIUM CARBONATE ER 300 MG PO TBCR: 600.0000 mg | EXTENDED_RELEASE_TABLET | Freq: Two times a day (BID) | ORAL | 1 refills | Status: DC

## 2019-08-30 NOTE — Progress Notes (Signed)
Virtual Visit via Telephone Note  I connected with Denise [name] on 08/30/19 at 10:00 AM EDT by telephone and verified that I am speaking with the correct person using two identifiers. She does not have Hospital doctor to do a Geographical information systems officer.   Location: Patient: home Provider: office   I discussed the limitations, risks, security and privacy concerns of performing an evaluation and management service by telephone and the availability of in person appointments. I also discussed with the patient that there may be a patient responsible charge related to this service. The patient expressed understanding and agreed to proceed.   History of Present Illness: Denise [name] reports she is not doing well. Her depression is worse. Her memory continues to be poor. She is unable to focus and keep up with conversations. She finds that her mind wanders a lot. Denise [name] has an appointment with neurology in September. She is feeling frustrated and tired. Denise [name] doesn't want to be here anymore- she doesn't want to live in her current house. She wants to move to a safer, better place.  She denies SIB/SI/HI. Her mind is all over the place. PTSD is ongoing. She has intrusive memories and HV. She rarely goes out. She did not start [name] because it was never shipped out. She did not start the increased dose of the Lithium either.    Observations/Objective:  General Appearance: unable to assess  Eye Contact:  unable to assess  Speech:  Clear and Coherent and Slow  Volume:  Normal  Mood:  Anxious and Depressed  Affect:  Congruent  Thought Process:  Coherent and Descriptions of Associations: Intact  Orientation:  Full (Time, Place, and Person)  Thought Content:  Rumination  Suicidal Thoughts:  No  Homicidal Thoughts:  No  Memory:  Immediate;   Fair  Judgement:  Fair  Insight:  Fair  Psychomotor Activity: unable to assess  Concentration:  Concentration: Fair  Recall:  [employer] of  Knowledge:  Fair  Language:  Fair  Akathisia:  unable to assess  Handed:  Right  AIMS (if indicated):     Assets:  Desire for Improvement Financial Resources/Insurance Housing Intimacy  ADL's:  unable to assess  Cognition:  WNL  Sleep:        I reviewed the information below on 08/30/2019 and have updated it. Assessment and Plan:  GAD; MDD-recurrent, severe with psychotic features; PTSD; social anxiety disorder; nicotine use disorder ; r/o RLS vs EPS; r/o Dementia     Status of current symptoms: ongoing   Buspar 20mg  po BID   Zoloft 200mg  po qD   Qtc 503 on 02/28/19- will monitor for change Restart [name] 1mg  po qD as she reports good benefit in the past   Increase Lithium CR 600mg  po qD- reviewed  BUN and Creatinine- WNL on 02/28/19   Unable to use Wellbutrin due to hx of seizures   Encouraged to conitnue therapy- reports it is weekly   appointment with neurology for memory, HA and EPS vs RLS- on Sept 20th   PCP appt on Aug 6th, sleep study on July 29th- awaiting the resutls      Follow Up Instructions: In 4-8 weeks or sooner if needed   I discussed the assessment and treatment plan with the patient. The patient was provided an opportunity to ask questions and all were answered. The patient agreed with the plan and demonstrated an understanding of the instructions.   The patient was advised to call back or  seek an in-person evaluation if the symptoms worsen or if the condition fails to improve as anticipated.  I provided 20 minutes of non-face-to-face time during this encounter.   Charlcie Cradle, MD

## 2019-09-04 ENCOUNTER — Other Ambulatory Visit: Payer: Self-pay

## 2019-09-04 ENCOUNTER — Ambulatory Visit (INDEPENDENT_AMBULATORY_CARE_PROVIDER_SITE_OTHER): Payer: Medicaid Other | Admitting: Licensed Clinical Social Worker

## 2019-09-04 DIAGNOSIS — F333 Major depressive disorder, recurrent, severe with psychotic symptoms: Secondary | ICD-10-CM | POA: Diagnosis not present

## 2019-09-04 DIAGNOSIS — F4312 Post-traumatic stress disorder, chronic: Secondary | ICD-10-CM | POA: Diagnosis not present

## 2019-09-04 DIAGNOSIS — F411 Generalized anxiety disorder: Secondary | ICD-10-CM

## 2019-09-04 DIAGNOSIS — F17210 Nicotine dependence, cigarettes, uncomplicated: Secondary | ICD-10-CM

## 2019-09-04 NOTE — Progress Notes (Signed)
Virtual Visit via Telephone Note   I connected with Denise [name] on 09/04/19 at 10:00am by telephone and verified that I am speaking with the correct person using two identifiers.  Denise [name] reported that she does not have a smart phone or internet access, so she is unable to engage in a video session.  Additionally, Denise [name] reported that she does not have reliable transportation and numerous health issues including frequent stress induced seizures, so she prefers telephone calls for her appointments.  Clinician was unable to comment on client's general appearance, hygiene, eye contact or psychomotor activity due to being unable to physically see her in session.  I discussed the limitations, risks, security and privacy concerns of performing an evaluation and management service by telephone and the availability of in person appointments. I also discussed with the patient that there may be a patient responsible charge related to this service. The patient expressed understanding and agreed to proceed.   I discussed the assessment and treatment plan with the patient. The patient was provided an opportunity to ask questions and all were answered. The patient agreed with the plan and demonstrated an understanding of the instructions.   The patient was advised to call back or seek an in-person evaluation if the symptoms worsen or if the condition fails to improve as anticipated.   I provided 30 minutes of non-face-to-face time during this encounter.     [name], LCSW, LCAS ___________________________________ THERAPIST PROGRESS NOTE   Session Time: 8:00am - 8:30am   Location: Patient: Patient Home Provider: [facility] [place] [facility]    Participation Level: Active   Behavioral Response: Alert, anxious and depressed mood   Type of Therapy:  Individual Therapy   Treatment Goals addressed: Medication management; Depression and anxiety management; Exercise/productivity   Interventions: CBT, coping  skills/self-care activities    Summary: Denise [name] is a 49 year old African American female who presented for telephone session today with diagnoses of Major Depressive Disorder, Recurrent Severe, with Psychotic Features; Chronic PTSD; GAD; and Cigarette Nicotine Dependence without complication.     Suicidal/Homicidal: None; without plan or intent    Therapist Response: Clinician spoke with Mateo [name] for telephone appointment due to inability to meet virtually or in person and assessed for safety, sobriety, and medication compliance. Denise [name] answered phone call for session on time and spoke in a manner that was alert, oriented x5, with no evidence or self-report of active SI/HI or A/V H. Denise [name] reported that she last experienced auditory hallucination yesterday evening when she heard knocking on the door, only to check, and find no one there.  Denise [name] reported that she continues taking medication as prescribed and denied use of alcohol or illicit substances.  Denise [name] reported that she continues limiting herself to x3 cigarettes daily.  Clinician inquired about Denise [name]'s emotional ratings today, as well as any significant changes in thoughts, feelings, or behaviors since previous check-in.  Denise [name] reported scores of 8/10 for both depression and anxiety and stated "I haven't had any seizures in over a week but I do feel worn out today".  Clinician inquired about progress that Denise [name] has made towards goals in past week, as well as present challenges.  Denise [name] reported that when she has energy, she has tried to straighten up things and organize around the home.  She reported that she has also been calling family members at least x1 per day to socialize, and regularly practicing relaxation skills, which she attributed to her decreased seizure frequency.  Denise [name] reported that she also  followed up on housing referrals and received more information from her doctor, so she will make phone calls today  about availability.  Denise [name] denied having any issues in mind to address today, so clinician proposed exploring 50 positive self-care activities on a list to give her ideas of things to include in daily routine to work towards lifting mood and providing additional outlet for stress.  Interventions were effective, as evidenced by Mateo [name] following along in discussion of activities and noting that this session gave her ideas of new things to engage in during free time, including listening to gospel music, going to [employer], listening to [employer], crocheting, watching comedy movies, and starting a garden.  Clinician will continue to monitor.      Plan: Follow up again virtually in 1 week.    Diagnosis: Major Depressive Disorder, Recurrent Severe, with Psychotic Features; Chronic PTSD; GAD; and Cigarette Nicotine Dependence without complication   [name], LCSW, LCAS 09/04/19

## 2019-09-08 DIAGNOSIS — R0683 Snoring: Secondary | ICD-10-CM | POA: Diagnosis not present

## 2019-09-08 NOTE — Procedures (Signed)
Patient Name: Denise [name], Denise [name] Date: 08/23/2019 Gender: Female D.O.B: Jan 12, 1971 Age (years): 89 Referring Provider: Vassie Moment MD Height (inches): 65 Interpreting Physician: Baird Lyons MD, ABSM Weight (lbs): 298 RPSGT: Zadie Rhine BMI: 50 MRN: [id number] Neck Size: 17.00  CLINICAL INFORMATION Sleep Study Type: Split Night CPAP Indication for sleep study: Obesity, Snoring, Witnessed Apneas Epworth Sleepiness Score: 12  SLEEP STUDY TECHNIQUE As per the AASM Manual for the Scoring of Sleep and Associated Events v2.3 (April 2016) with a hypopnea requiring 4% desaturations.  The channels recorded and monitored were frontal, central and occipital EEG, electrooculogram (EOG), submentalis EMG (chin), nasal and oral airflow, thoracic and abdominal wall motion, anterior tibialis EMG, snore microphone, electrocardiogram, and pulse oximetry. Continuous positive airway pressure (CPAP) was initiated when the patient met split night criteria and was titrated according to treat sleep-disordered breathing.  MEDICATIONS Medications self-administered by patient taken the night of the study : NORVASC, LIPITOR, ESKALITH, LISINOPRIL, GABAPENTIN, METFORMIN, BUSPAR  RESPIRATORY PARAMETERS Diagnostic  Total AHI (/hr): 10.6 RDI (/hr): 17.0 OA Index (/hr): 1.5 CA Index (/hr): 0.0 REM AHI (/hr): 0.0 NREM AHI (/hr): 11.1 Supine AHI (/hr): 10.6 Non-supine AHI (/hr): 0 Min O2 Sat (%): 80.0 Mean O2 (%): 90.6 Time below 88% (min): 19.8   Titration  Optimal Pressure (cm): 17 AHI at Optimal Pressure (/hr): 1.7 Min O2 at Optimal Pressure (%): 86.0 Supine % at Optimal (%): 100 Sleep % at Optimal (%): 95   SLEEP ARCHITECTURE The recording time for the entire night was 389.7 minutes.  During a baseline period of 244.6 minutes, the patient slept for 198.0 minutes in REM and nonREM, yielding a sleep efficiency of 80.9%%. Sleep onset after lights out was 43.5 minutes with a REM latency of  146.0 minutes. The patient spent 2.5%% of the night in stage N1 sleep, 66.2%% in stage N2 sleep, 27.3%% in stage N3 and 4% in REM.  During the titration period of 142.1 minutes, the patient slept for 135.5 minutes in REM and nonREM, yielding a sleep efficiency of 95.3%%. Sleep onset after CPAP initiation was 4.4 minutes with a REM latency of 82.0 minutes. The patient spent 3.3%% of the night in stage N1 sleep, 56.8%% in stage N2 sleep, 0.4%% in stage N3 and 39.5% in REM.  CARDIAC DATA The 2 lead EKG demonstrated sinus rhythm. The mean heart rate was 100.0 beats per minute. Other EKG findings include: None.  LEG MOVEMENT DATA The total Periodic Limb Movements of Sleep (PLMS) were 0. The PLMS index was 0.0 .  IMPRESSIONS - Mild obstructive sleep apnea occurred during the diagnostic portion of the study (AHI = 10.6 /hour). An optimal PAP pressure was selected for this patient ( 17 cm of water) - No significant central sleep apnea occurred during the diagnostic portion of the study (CAI = 0.0/hour). - Sustained Oxygen desaturation was noted during the diagnostic portion of the study (Min O2 = 80.0%). - Supplemental O2 at 2L was addedd per protocol when CPAP failed to correct hypoxemia.  - Mean O2 saturation on CPAP 17 with 2L O2 was 89.7%. - Snoring was audible during the diagnostic portion of the study. - No cardiac abnormalities were noted during this study. - Clinically significant periodic limb movements did not occur during sleep.  DIAGNOSIS - Obstructive Sleep Apnea (G47.33) - Nocturnal Hypoxemia  RECOMMENDATIONS - Trial of CPAP therapy on 17 cm H2O or autopap 10-20. - Patient used a Small size Resmed Full Face Mask AirFit F20 mask and heated humidification. -  Recommend additon of supplemental O2 3L through CPAP during sleep, and evaluate for Obesity-Hypoventilation or other reasons for nocturnal hypoxemia. - Be careful with alcohol, sedatives and other CNS depressants that may worsen  sleep apnea and disrupt normal sleep architecture. - Sleep hygiene should be reviewed to assess factors that may improve sleep quality. - Weight management and regular exercise should be initiated or continued.  [Electronically signed] 09/08/2019 11:56 AM  Baird Lyons MD, ABSM Diplomate, American Board of Sleep Medicine   NPI: [id number]                          [place]  ELECTRONICALLY SIGNED ON:  09/08/2019, 11:47 AM [place] PH: [phone]   FX: [phone] [place]

## 2019-09-11 ENCOUNTER — Ambulatory Visit (INDEPENDENT_AMBULATORY_CARE_PROVIDER_SITE_OTHER): Payer: Medicaid Other | Admitting: Licensed Clinical Social Worker

## 2019-09-11 ENCOUNTER — Other Ambulatory Visit: Payer: Self-pay

## 2019-09-11 DIAGNOSIS — F411 Generalized anxiety disorder: Secondary | ICD-10-CM | POA: Diagnosis not present

## 2019-09-11 DIAGNOSIS — F17210 Nicotine dependence, cigarettes, uncomplicated: Secondary | ICD-10-CM | POA: Diagnosis not present

## 2019-09-11 DIAGNOSIS — F4312 Post-traumatic stress disorder, chronic: Secondary | ICD-10-CM | POA: Diagnosis not present

## 2019-09-11 DIAGNOSIS — F333 Major depressive disorder, recurrent, severe with psychotic symptoms: Secondary | ICD-10-CM

## 2019-09-11 NOTE — Progress Notes (Signed)
Virtual Visit via Telephone Note   I connected with Denise [name] on 09/11/19 at 9:00am by telephone and verified that I am speaking with the correct person using two identifiers.  Denise [name] reported that she does not have a smart phone or internet access, so she is unable to engage in a video session.  Additionally, Denise [name] reported that she does not have reliable transportation and numerous health issues including frequent stress induced seizures, so she prefers telephone calls for her appointments.  Clinician was unable to comment on client's general appearance, hygiene, eye contact or psychomotor activity due to being unable to physically see her in session.  I discussed the limitations, risks, security and privacy concerns of performing an evaluation and management service by telephone and the availability of in person appointments. I also discussed with the patient that there may be a patient responsible charge related to this service. The patient expressed understanding and agreed to proceed.   I discussed the assessment and treatment plan with the patient. The patient was provided an opportunity to ask questions and all were answered. The patient agreed with the plan and demonstrated an understanding of the instructions.   The patient was advised to call back or seek an in-person evaluation if the symptoms worsen or if the condition fails to improve as anticipated.   I provided 30 minutes of non-face-to-face time during this encounter.     Denise Flood, LCSW, LCAS ___________________________________ THERAPIST PROGRESS NOTE   Session Time: 9:00am - 9:30am  Location: Patient: Patient Home Provider: [facility] [place] Office    Participation Level: Active   Behavioral Response: Alert, depressed mood   Type of Therapy:  Individual Therapy   Treatment Goals addressed: Medication management; Depression and anxiety management; Medical appointments   Interventions: CBT, gratitude journaling    Summary: Denise [name] is a 49 year old African American female who presented for telephone session today with diagnoses of Major Depressive Disorder, Recurrent Severe, with Psychotic Features; Chronic PTSD; GAD; and Cigarette Nicotine Dependence without complication.     Suicidal/Homicidal: None; without plan or intent    Therapist Response: Clinician spoke with Denise [name] for telephone session due to inability to meet virtually or in person and assessed for safety, sobriety, and medication compliance. Denise [name] answered phone call for appointment on time and spoke in a manner that was alert, oriented x5, with no evidence or self-report of active SI/HI or A/V H. Denise [name] reported that she believes she last experienced auditory hallucination yesterday evening when she heard movement in her kitchen late at night, which passed after a few minutes.  Denise [name] reported that she remains compliant with medication and denied use of alcohol or illicit substances.  Denise [name] reported ongoing intake of x3 cigarettes daily.  Clinician inquired about Denise [name]'s current emotional ratings, as well as any significant changes in thoughts, feelings, or behaviors since last check-in.  Denise [name] reported scores of 4/10 for both depression and anxiety and stated "I haven't had a panic attack or seizure in about 2 weeks".  Clinician inquired about Denise [name]'s successes in past week, as well as present struggles she needs assistance with.  Denise [name] reported that she has felt less motivated in past week and has several tasks at home to take care of, but has found this difficult to take care of due to involuntary hand movements she has begun experiencing.  Clinician encouraged Denise [name] to outreach her PCP about this symptom for guidance on what to do, including need to go to urgent care if condition worsens.  Denise [name] was agreeable, and noted that she has an upcoming appointment on the 22nd, but would reach out sooner if condition changes.   She reported that she has been overly focused on this change, which has made her sad at times.  Clinician discussed gratitude journaling activity with Denise [name] today which she could use to shift focus from negative aspects of her life to more positive things in an effort to boost mood and outlook.  Clinician provided Denise [name] with some journal prompts to guide her daily journaling, using examples such as "What is a challenge you're experiencing right now that you could be thankful for?", "What are 5 skills or abilities you're grateful to have?", and "What are 3 ways you can share your gratitude with someone you love today?".  Intervention was effective, as evidenced by Denise [name] participating in activity and noting several things that she was grateful for which lifted her spirits, including her husband's support and love, taking hot showers, her higher power, [place] ice cream, phone calls from family, and upcoming holiday season.  Denise [name] reported that she takes notes during therapy and would try to make this activity part of her developing self-care routine.  Clinician will continue to monitor.      Plan: Follow up again virtually in 1 week.    Diagnosis: Major Depressive Disorder, Recurrent Severe, with Psychotic Features; Chronic PTSD; GAD; and Cigarette Nicotine Dependence without complication   Denise Flood, LCSW, LCAS 09/11/19

## 2019-09-18 ENCOUNTER — Other Ambulatory Visit: Payer: Self-pay

## 2019-09-18 ENCOUNTER — Ambulatory Visit (HOSPITAL_COMMUNITY): Payer: Medicaid Other | Admitting: Licensed Clinical Social Worker

## 2019-09-18 ENCOUNTER — Telehealth (HOSPITAL_COMMUNITY): Payer: Self-pay | Admitting: Licensed Clinical Social Worker

## 2019-09-18 NOTE — Telephone Encounter (Signed)
Clinician contacted [name] by telephone today for therapy session.  Yurianna answered phone call on time and spoke in a manner that was alert, oriented x5, with no evidence or self-report of SI/HI or A/V H.  Shatarra reported that she was not feeling well today and wished to reschedule appointment for later date.  Clinician confirmed that Dae had number for front desk to outreach for rescheduling, and encouraged her to seek medical attention if condition worsened, which she was agreeable to.  Namiah stated "My husband is here and keeping an eye on me if I need anything".  Clinician will continue to monitor.    [name], LCSW, LCAS 09/18/19

## 2019-09-25 ENCOUNTER — Inpatient Hospital Stay (HOSPITAL_COMMUNITY)
Admission: EM | Admit: 2019-09-25 | Discharge: 2019-09-27 | DRG: 918 | Disposition: A | Discharge status: Home / Self Care | Payer: Medicaid Other | Attending: Internal Medicine | Admitting: Internal Medicine

## 2019-09-25 ENCOUNTER — Ambulatory Visit (HOSPITAL_COMMUNITY): Payer: Medicaid Other | Admitting: Licensed Clinical Social Worker

## 2019-09-25 ENCOUNTER — Encounter (HOSPITAL_COMMUNITY): Payer: Self-pay | Admitting: Emergency Medicine

## 2019-09-25 ENCOUNTER — Emergency Department (HOSPITAL_COMMUNITY): Payer: Medicaid Other

## 2019-09-25 ENCOUNTER — Other Ambulatory Visit: Payer: Self-pay

## 2019-09-25 DIAGNOSIS — I10 Essential (primary) hypertension: Secondary | ICD-10-CM

## 2019-09-25 DIAGNOSIS — Z6841 Body Mass Index (BMI) 40.0 and over, adult: Secondary | ICD-10-CM | POA: Diagnosis not present

## 2019-09-25 DIAGNOSIS — Z87891 Personal history of nicotine dependence: Secondary | ICD-10-CM

## 2019-09-25 DIAGNOSIS — F332 Major depressive disorder, recurrent severe without psychotic features: Secondary | ICD-10-CM

## 2019-09-25 DIAGNOSIS — G253 Myoclonus: Secondary | ICD-10-CM | POA: Diagnosis present

## 2019-09-25 DIAGNOSIS — Z8249 Family history of ischemic heart disease and other diseases of the circulatory system: Secondary | ICD-10-CM | POA: Diagnosis not present

## 2019-09-25 DIAGNOSIS — F445 Conversion disorder with seizures or convulsions: Secondary | ICD-10-CM

## 2019-09-25 DIAGNOSIS — F419 Anxiety disorder, unspecified: Secondary | ICD-10-CM | POA: Diagnosis present

## 2019-09-25 DIAGNOSIS — T56891A Toxic effect of other metals, accidental (unintentional), initial encounter: Secondary | ICD-10-CM

## 2019-09-25 DIAGNOSIS — Z7984 Long term (current) use of oral hypoglycemic drugs: Secondary | ICD-10-CM

## 2019-09-25 DIAGNOSIS — Z79899 Other long term (current) drug therapy: Secondary | ICD-10-CM | POA: Diagnosis not present

## 2019-09-25 DIAGNOSIS — F4312 Post-traumatic stress disorder, chronic: Secondary | ICD-10-CM

## 2019-09-25 DIAGNOSIS — F0390 Unspecified dementia without behavioral disturbance: Secondary | ICD-10-CM | POA: Diagnosis present

## 2019-09-25 DIAGNOSIS — R270 Ataxia, unspecified: Secondary | ICD-10-CM | POA: Diagnosis present

## 2019-09-25 DIAGNOSIS — T43591A Poisoning by other antipsychotics and neuroleptics, accidental (unintentional), initial encounter: Secondary | ICD-10-CM | POA: Diagnosis not present

## 2019-09-25 DIAGNOSIS — F333 Major depressive disorder, recurrent, severe with psychotic symptoms: Secondary | ICD-10-CM

## 2019-09-25 DIAGNOSIS — Z720 Tobacco use: Secondary | ICD-10-CM | POA: Diagnosis present

## 2019-09-25 DIAGNOSIS — E785 Hyperlipidemia, unspecified: Secondary | ICD-10-CM | POA: Diagnosis present

## 2019-09-25 DIAGNOSIS — Z8673 Personal history of transient ischemic attack (TIA), and cerebral infarction without residual deficits: Secondary | ICD-10-CM | POA: Diagnosis not present

## 2019-09-25 DIAGNOSIS — F17210 Nicotine dependence, cigarettes, uncomplicated: Secondary | ICD-10-CM

## 2019-09-25 DIAGNOSIS — Z20822 Contact with and (suspected) exposure to covid-19: Secondary | ICD-10-CM | POA: Diagnosis present

## 2019-09-25 DIAGNOSIS — Z87448 Personal history of other diseases of urinary system: Secondary | ICD-10-CM | POA: Diagnosis not present

## 2019-09-25 DIAGNOSIS — E669 Obesity, unspecified: Secondary | ICD-10-CM | POA: Diagnosis present

## 2019-09-25 DIAGNOSIS — Z823 Family history of stroke: Secondary | ICD-10-CM

## 2019-09-25 DIAGNOSIS — F411 Generalized anxiety disorder: Secondary | ICD-10-CM

## 2019-09-25 DIAGNOSIS — F4010 Social phobia, unspecified: Secondary | ICD-10-CM | POA: Diagnosis present

## 2019-09-25 DIAGNOSIS — T56892D Toxic effect of other metals, intentional self-harm, subsequent encounter: Secondary | ICD-10-CM

## 2019-09-25 DIAGNOSIS — D72829 Elevated white blood cell count, unspecified: Secondary | ICD-10-CM

## 2019-09-25 DIAGNOSIS — Z818 Family history of other mental and behavioral disorders: Secondary | ICD-10-CM

## 2019-09-25 HISTORY — DX: Toxic effect of other metals, accidental (unintentional), initial encounter: T56.891A

## 2019-09-25 HISTORY — DX: Type 2 diabetes mellitus without complications: E11.9

## 2019-09-25 LAB — GLUCOSE, CAPILLARY: Glucose-Capillary: 111 mg/dL — ABNORMAL HIGH (ref 70–99)

## 2019-09-25 LAB — URINALYSIS, ROUTINE W REFLEX MICROSCOPIC
Bilirubin Urine: NEGATIVE
Glucose, UA: NEGATIVE mg/dL
Hgb urine dipstick: NEGATIVE
Ketones, ur: NEGATIVE mg/dL
Leukocytes,Ua: NEGATIVE
Nitrite: NEGATIVE
Protein, ur: NEGATIVE mg/dL
Specific Gravity, Urine: 1.014 (ref 1.005–1.030)
pH: 6 (ref 5.0–8.0)

## 2019-09-25 LAB — CBC
HCT: 40.6 % (ref 36.0–46.0)
Hemoglobin: 13.1 g/dL (ref 12.0–15.0)
MCH: 29.6 pg (ref 26.0–34.0)
MCHC: 32.3 g/dL (ref 30.0–36.0)
MCV: 91.6 fL (ref 80.0–100.0)
Platelets: 385 10*3/uL (ref 150–400)
RBC: 4.43 MIL/uL (ref 3.87–5.11)
RDW: 15.4 % (ref 11.5–15.5)
WBC: 14.3 10*3/uL — ABNORMAL HIGH (ref 4.0–10.5)
nRBC: 0 % (ref 0.0–0.2)

## 2019-09-25 LAB — COMPREHENSIVE METABOLIC PANEL
ALT: 20 U/L (ref 0–44)
AST: 24 U/L (ref 15–41)
Albumin: 4.2 g/dL (ref 3.5–5.0)
Alkaline Phosphatase: 110 U/L (ref 38–126)
Anion gap: 7 (ref 5–15)
BUN: 16 mg/dL (ref 6–20)
CO2: 27 mmol/L (ref 22–32)
Calcium: 9.9 mg/dL (ref 8.9–10.3)
Chloride: 103 mmol/L (ref 98–111)
Creatinine, Ser: 0.84 mg/dL (ref 0.44–1.00)
GFR calc Af Amer: >60 mL/min (ref >60)
GFR calc non Af Amer: >60 mL/min (ref >60)
Glucose, Bld: 107 mg/dL — ABNORMAL HIGH (ref 70–99)
Potassium: 4.3 mmol/L (ref 3.5–5.1)
Sodium: 137 mmol/L (ref 135–145)
Total Bilirubin: 0.3 mg/dL (ref 0.3–1.2)
Total Protein: 7 g/dL (ref 6.5–8.1)

## 2019-09-25 LAB — TSH: TSH: 5.147 u[IU]/mL — ABNORMAL HIGH (ref 0.350–4.500)

## 2019-09-25 LAB — CBG MONITORING, ED: Glucose-Capillary: 101 mg/dL — ABNORMAL HIGH (ref 70–99)

## 2019-09-25 LAB — ACETAMINOPHEN LEVEL: Acetaminophen (Tylenol), Serum: <10 ug/mL — ABNORMAL LOW (ref 10–30)

## 2019-09-25 LAB — RAPID URINE DRUG SCREEN, HOSP PERFORMED
Amphetamines: NOT DETECTED
Barbiturates: NOT DETECTED
Benzodiazepines: NOT DETECTED
Cocaine: NOT DETECTED
Opiates: NOT DETECTED
Tetrahydrocannabinol: NOT DETECTED

## 2019-09-25 LAB — HEMOGLOBIN A1C
Hgb A1c MFr Bld: 5.4 % (ref 4.8–5.6)
Mean Plasma Glucose: 108.28 mg/dL

## 2019-09-25 LAB — HCG, QUANTITATIVE, PREGNANCY: hCG, Beta Chain, Quant, S: 2 m[IU]/mL (ref <5)

## 2019-09-25 LAB — SALICYLATE LEVEL: Salicylate Lvl: <7 mg/dL — ABNORMAL LOW (ref 7.0–30.0)

## 2019-09-25 LAB — ETHANOL: Alcohol, Ethyl (B): <10 mg/dL (ref <10)

## 2019-09-25 LAB — LACTIC ACID, PLASMA: Lactic Acid, Venous: 0.9 mmol/L (ref 0.5–1.9)

## 2019-09-25 LAB — HIV ANTIBODY (ROUTINE TESTING W REFLEX): HIV Screen 4th Generation wRfx: NONREACTIVE

## 2019-09-25 LAB — LITHIUM LEVEL
Lithium Lvl: 2.64 mmol/L (ref 0.60–1.20)
Lithium Lvl: 2.45 mmol/L (ref 0.60–1.20)
Lithium Lvl: 2.19 mmol/L (ref 0.60–1.20)

## 2019-09-25 LAB — SARS CORONAVIRUS 2 BY RT PCR (HOSPITAL ORDER, PERFORMED IN ~~LOC~~ HOSPITAL LAB): SARS Coronavirus 2: NEGATIVE

## 2019-09-25 MED ORDER — LISINOPRIL 20 MG PO TABS
20.0000 mg | ORAL_TABLET | Freq: Two times a day (BID) | ORAL | Status: DC
  Administered 2019-09-25 – 2019-09-27 (×4): 20 mg via ORAL
  Filled 2019-09-25 (×4): qty 1

## 2019-09-25 MED ORDER — ENOXAPARIN SODIUM 60 MG/0.6ML ~~LOC~~ SOLN
60.0000 mg | SUBCUTANEOUS | Status: DC
  Administered 2019-09-25: 60 mg via SUBCUTANEOUS
  Filled 2019-09-25: qty 0.6

## 2019-09-25 MED ORDER — SODIUM CHLORIDE 0.9 % IV SOLN: Freq: Once | INTRAVENOUS | Status: DC

## 2019-09-25 MED ORDER — ATORVASTATIN CALCIUM 10 MG PO TABS
10.0000 mg | ORAL_TABLET | Freq: Every day | ORAL | Status: DC
  Administered 2019-09-25 – 2019-09-26 (×2): 10 mg via ORAL
  Filled 2019-09-25 (×2): qty 1

## 2019-09-25 MED ORDER — AMLODIPINE BESYLATE 10 MG PO TABS
10.0000 mg | ORAL_TABLET | Freq: Every day | ORAL | Status: DC
  Administered 2019-09-26 – 2019-09-27 (×2): 10 mg via ORAL
  Filled 2019-09-25 (×2): qty 1

## 2019-09-25 MED ORDER — HYDROCHLOROTHIAZIDE 25 MG PO TABS
12.5000 mg | ORAL_TABLET | Freq: Every day | ORAL | Status: DC
  Administered 2019-09-25: 12.5 mg via ORAL
  Filled 2019-09-25: qty 0.5

## 2019-09-25 MED ORDER — ACETAMINOPHEN 650 MG RE SUPP: 650.0000 mg | Freq: Four times a day (QID) | RECTAL | Status: DC | PRN

## 2019-09-25 MED ORDER — ACETAMINOPHEN 325 MG PO TABS
650.0000 mg | ORAL_TABLET | Freq: Four times a day (QID) | ORAL | Status: DC | PRN
  Administered 2019-09-25: 650 mg via ORAL
  Filled 2019-09-25: qty 2

## 2019-09-25 MED ORDER — ALBUTEROL SULFATE HFA 108 (90 BASE) MCG/ACT IN AERS
2.0000 | INHALATION_SPRAY | Freq: Once | RESPIRATORY_TRACT | Status: AC
  Administered 2019-09-25: 2 via RESPIRATORY_TRACT
  Filled 2019-09-25: qty 6.7

## 2019-09-25 MED ORDER — INSULIN ASPART 100 UNIT/ML ~~LOC~~ SOLN
0.0000 [IU] | Freq: Every day | SUBCUTANEOUS | Status: DC
  Filled 2019-09-25: qty 0.05

## 2019-09-25 MED ORDER — SODIUM CHLORIDE 0.9 % IV SOLN: INTRAVENOUS | Status: DC

## 2019-09-25 MED ORDER — SODIUM CHLORIDE 0.9 % IV BOLUS
500.0000 mL | Freq: Once | INTRAVENOUS | Status: AC
  Administered 2019-09-25: 500 mL via INTRAVENOUS

## 2019-09-25 MED ORDER — SODIUM CHLORIDE 0.9 % IV BOLUS: 1000.0000 mL | Freq: Once | INTRAVENOUS | Status: DC

## 2019-09-25 MED ORDER — SODIUM CHLORIDE 0.9 % IV BOLUS
2000.0000 mL | Freq: Once | INTRAVENOUS | Status: AC
  Administered 2019-09-25: 2000 mL via INTRAVENOUS

## 2019-09-25 MED ORDER — ONDANSETRON HCL 4 MG/2ML IJ SOLN: 4.0000 mg | Freq: Four times a day (QID) | INTRAMUSCULAR | Status: DC | PRN

## 2019-09-25 MED ORDER — INSULIN ASPART 100 UNIT/ML ~~LOC~~ SOLN
0.0000 [IU] | Freq: Three times a day (TID) | SUBCUTANEOUS | Status: DC
  Administered 2019-09-26 – 2019-09-27 (×3): 2 [IU] via SUBCUTANEOUS
  Filled 2019-09-25: qty 0.15

## 2019-09-25 NOTE — ED Notes (Signed)
Denise [name]- [phone] call with updates

## 2019-09-25 NOTE — ED Triage Notes (Addendum)
Per EMS-husband states she "becomes like this" every now and then-becomes nonresponsive to questions etc-states she has been evaluated for this before-unable to find cause of symptoms-tremors r/t previous stroke-husband is a poor historian-was told by patient's psychologist to come to ED-CBG 118

## 2019-09-25 NOTE — H&P (Addendum)
History and Physical    Symiah Nowotny BSW:[id number] DOB: [birth year] DOA: 09/25/2019  PCP: [facility]  Patient coming from: Home  Chief Complaint: Confusion  HPI: Viha Kriegel is a 49 y.o. female with medical history significant of dementia, anxiety, hyperlipidemia, obesity, hypertension, history of TIA who presented to the emergency department brought by family with mental status change where patient periodically becomes unresponsive to answering questions or participating in conversation.  ED Course: In the emergency department, head CT was performed, found to be unremarkable for acute process.  Noted to to be 0.84 with sodium of 137 and potassium of 4.3.  Liver function test was found to be unremarkable.  Patient did have a white blood count of 14.3.  Lithium level was noted to be elevated at 2.64.  EDP discussed case with poison control who recommended IV fluid hydration and checking serial lithium levels.  EDP discussed case with nephrology on-call who recommended IV fluid hydration and admission to [facility].  Hospitalist service consulted for consideration for medical admission  Review of Systems:  Review of Systems  Constitutional: Negative for chills, fever and weight loss.  HENT: Negative for congestion, ear discharge, ear pain and nosebleeds.   Eyes: Negative for double vision, photophobia and pain.  Respiratory: Positive for cough. Negative for shortness of breath and wheezing.   Cardiovascular: Negative for palpitations, orthopnea and claudication.  Gastrointestinal: Negative for abdominal pain, nausea and vomiting.  Genitourinary: Negative for frequency, hematuria and urgency.  Musculoskeletal: Negative for back pain, joint pain and neck pain.  Neurological: Positive for tremors. Negative for focal weakness, loss of consciousness and weakness.  Psychiatric/Behavioral: Negative for hallucinations and memory loss.     Past Medical History:  Diagnosis Date  . Abnormal EKG   . AD (atopic dermatitis)   . Alcohol use 04/14/2015  . Anxiety   . Blood transfusion without reported diagnosis   . Depression   . Diabetes mellitus without complication ([place])   . Fibroid uterus 12/18/2014  . Hyperlipemia   . Hypertension   . Memory changes   . Overactive bladder   . Pelvic pain in female 12/18/2014  . Prediabetes   . Right sided weakness   . Seizures ([place])   . Stroke ([place])   . Substance abuse ([place])   . Syncope 02/07/2015  . TIA (transient ischemic attack)   . Tobacco use 04/14/2015    Past Surgical History:  Procedure Laterality Date  . CESAREAN SECTION     x 2     reports that she quit smoking about 10 months ago. Her smoking use included cigarettes. She smoked 0.50 packs per day. She has never used smokeless tobacco. She reports previous alcohol use. She reports that she does not use drugs.  Allergies  Allergen Reactions  . Grapefruit Flavor [Flavoring Agent] Other (See Comments)    Drug interaction    Family History  Problem Relation Age of Onset  . Stroke Mother   . Anxiety disorder Mother   . Drug abuse Mother   . Alcohol abuse Mother   . Depression Mother   . Heart disease Maternal Grandmother   . Drug abuse Father   . Anxiety disorder Father   . Colon cancer Neg Hx   . Esophageal cancer Neg Hx   . Pancreatic cancer Neg Hx   . Stomach cancer Neg Hx   . Liver disease Neg Hx     Prior to Admission medications   Medication Sig Start  Date End Date Taking? Authorizing Provider  amLODipine (NORVASC) 10 MG tablet Take 10 mg by mouth daily.    [provider]  atorvastatin (LIPITOR) 10 MG tablet Take 10 mg by mouth at bedtime. 06/29/15   [provider]  Brexpiprazole ([name]) 0.5 MG TABS Take 1 tablet (0.5 mg total) by mouth daily. 08/30/19   Charlcie Cradle, MD  busPIRone (BUSPAR) 10 MG tablet Take 2 tablets (20 mg total) by mouth 2 (two) times daily. 08/30/19    Charlcie Cradle, MD  ibuprofen (ADVIL,MOTRIN) 200 MG tablet Take 400 mg by mouth daily as needed for mild pain.  Patient not taking: Reported on 08/30/2019    [provider]  lisinopril (PRINIVIL,ZESTRIL) 20 MG tablet Take 20 mg by mouth 2 (two) times daily.     [provider]  lithium carbonate (LITHOBID) 300 MG CR tablet Take 2 tablets (600 mg total) by mouth 2 (two) times daily. 08/30/19   Charlcie Cradle, MD  metFORMIN (GLUCOPHAGE) 500 MG tablet Take 500 mg by mouth 2 (two) times daily. 05/25/17   [provider]  Naproxen Sodium 220 MG CAPS Take 2 capsules by mouth.    [provider]  polyethylene glycol powder (GLYCOLAX/MIRALAX) powder Mix 17 g with 4-8 ounces of juice and drink once a day Patient not taking: Reported on 08/30/2019 06/26/15   [provider]  sertraline (ZOLOFT) 100 MG tablet Take 2 tablets (200 mg total) by mouth every morning. 08/30/19 11/28/19  Charlcie Cradle, MD    Physical Exam: Vitals:   09/25/19 1049 09/25/19 1109 09/25/19 1328  BP: 101/76  101/76  Pulse: 74  74  Resp: 20  20  Temp: 99.3 F (37.4 C) 100.2 F (37.9 C) 100.2 F (37.9 C)  TempSrc: Oral Rectal Rectal  SpO2: 92%  92%    Constitutional: NAD, calm, comfortable Vitals:   09/25/19 1049 09/25/19 1109 09/25/19 1328  BP: 101/76  101/76  Pulse: 74  74  Resp: 20  20  Temp: 99.3 F (37.4 C) 100.2 F (37.9 C) 100.2 F (37.9 C)  TempSrc: Oral Rectal Rectal  SpO2: 92%  92%   Eyes: PERRL, lids and conjunctivae normal ENMT: External ear appears normal, head atraumatic.  Neck: normal, supple, no masses, trachea midline Respiratory: Normal respiratory effort, no audible wheezing Cardiovascular: Perfused, no notable JVD Abdomen: Obese, nondistended Musculoskeletal: no clubbing / cyanosis. No joint deformity upper and lower extremities. Good ROM, no contractures. Normal muscle tone.  Skin: no rashes, lesions Neurologic: CN 2-12 grossly intact. Sensation intact,.  Strength 5/5 in all 4.  Psychiatric: Normal judgment and insight. Alert and oriented x 3. Normal mood.    Labs on Admission: I have personally reviewed following labs and imaging studies  CBC: Recent Labs  Lab 09/25/19 1109  WBC 14.3*  HGB 13.1  HCT 40.6  MCV 91.6  PLT 081   Basic Metabolic Panel: Recent Labs  Lab 09/25/19 1109  NA 137  K 4.3  CL 103  CO2 27  GLUCOSE 107*  BUN 16  CREATININE 0.84  CALCIUM 9.9   GFR: CrCl cannot be calculated (Unknown ideal weight.). Liver Function Tests: Recent Labs  Lab 09/25/19 1109  AST 24  ALT 20  ALKPHOS 110  BILITOT 0.3  PROT 7.0  ALBUMIN 4.2   No results for input(s): LIPASE, AMYLASE in the last 168 hours. No results for input(s): AMMONIA in the last 168 hours. Coagulation Profile: No results for input(s): INR, PROTIME in the last 168 hours.  Cardiac Enzymes: No results for input(s): CKTOTAL, CKMB, CKMBINDEX, TROPONINI in the last 168 hours. BNP (last 3 results) No results for input(s): PROBNP in the last 8760 hours. HbA1C: No results for input(s): HGBA1C in the last 72 hours. CBG: Recent Labs  Lab 09/25/19 1239  GLUCAP 101*   Lipid Profile: No results for input(s): CHOL, HDL, LDLCALC, TRIG, CHOLHDL, LDLDIRECT in the last 72 hours. Thyroid Function Tests: No results for input(s): TSH, T4TOTAL, FREET4, T3FREE, THYROIDAB in the last 72 hours. Anemia Panel: No results for input(s): VITAMINB12, FOLATE, FERRITIN, TIBC, IRON, RETICCTPCT in the last 72 hours. Urine analysis:    Component Value Date/Time   COLORURINE YELLOW 09/25/2019 1340   APPEARANCEUR CLEAR 09/25/2019 1340   LABSPEC 1.014 09/25/2019 1340   PHURINE 6.0 09/25/2019 1340   GLUCOSEU NEGATIVE 09/25/2019 1340   HGBUR NEGATIVE 09/25/2019 1340   BILIRUBINUR NEGATIVE 09/25/2019 1340   KETONESUR NEGATIVE 09/25/2019 1340   PROTEINUR NEGATIVE 09/25/2019 1340   UROBILINOGEN 0.2 11/22/2014 0240   NITRITE NEGATIVE 09/25/2019 1340   LEUKOCYTESUR NEGATIVE  09/25/2019 1340   Sepsis Labs: !!!!!!!!!!!!!!!!!!!!!!!!!!!!!!!!!!!!!!!!!!!! @LABRCNTIP (procalcitonin:4,lacticidven:4) ) Recent Results (from the past 240 hour(s))  SARS Coronavirus 2 by RT PCR (hospital order, performed in [place] hospital lab) Nasopharyngeal Nasopharyngeal Swab     Status: None   Collection Time: 09/25/19 11:09 AM   Specimen: Nasopharyngeal Swab  Result Value Ref Range Status   SARS Coronavirus 2 NEGATIVE NEGATIVE Final    Comment: (NOTE) SARS-CoV-2 target nucleic acids are NOT DETECTED.  The SARS-CoV-2 RNA is generally detectable in upper and lower respiratory specimens during the acute phase of infection. The lowest concentration of SARS-CoV-2 viral copies this assay can detect is 250 copies / mL. A negative result does not preclude SARS-CoV-2 infection and should not be used as the sole basis for treatment or other patient management decisions.  A negative result may occur with improper specimen collection / handling, submission of specimen other than nasopharyngeal swab, presence of viral mutation(s) within the areas targeted by this assay, and inadequate number of viral copies (<250 copies / mL). A negative result must be combined with clinical observations, patient history, and epidemiological information.  Fact Sheet for Patients:   StrictlyIdeas.no  Fact Sheet for Healthcare Providers: BankingDealers.co.za  This test is not yet approved or  cleared by the [place] FDA and has been authorized for detection and/or diagnosis of SARS-CoV-2 by FDA under an Emergency Use Authorization (EUA).  This EUA will remain in effect (meaning this test can be used) for the duration of the COVID-19 declaration under Section 564(b)(1) of the Act, 21 U.S.C. section 360bbb-3(b)(1), unless the authorization is terminated or revoked sooner.  Performed at [facility], [place] [address], [place],  [address]      Radiological Exams on Admission: CT Head Wo Contrast  Result Date: 09/25/2019 CLINICAL DATA:  Delirium. Additional history provided: Confusion for 2 days, frontal headache for 2 days. EXAM: CT HEAD WITHOUT CONTRAST TECHNIQUE: Contiguous axial images were obtained from the base of the skull through the vertex without intravenous contrast. COMPARISON:  Brain MRI 05/19/2017, head CT 05/19/2017. FINDINGS: Brain: Cerebral volume is normal. Redemonstrated chronic left basal ganglia infarct. There is no acute intracranial hemorrhage. No demarcated cortical infarct. No extra-axial fluid collection. No evidence of intracranial mass. No midline shift. Partially empty sella turcica. Vascular: No hyperdense vessel.  Atherosclerotic calcifications Skull: Normal. Negative for fracture or focal lesion. Sinuses/Orbits: Visualized orbits show no acute finding. Mild ethmoid  sinus mucosal thickening. No significant mastoid effusion. IMPRESSION: No CT evidence of acute intracranial abnormality. Redemonstrated chronic left basal ganglia infarct. Partially empty sella turcica. This is very commonly an incidental finding, but can be associated with idiopathic intracranial hypertension. Mild ethmoid sinus mucosal thickening. Electronically Signed   By: Kellie Simmering DO   On: 09/25/2019 13:43   DG Chest Portable 1 View  Result Date: 09/25/2019 CLINICAL DATA:  Cough. EXAM: PORTABLE CHEST 1 VIEW COMPARISON:  February 28, 2019. FINDINGS: The heart size and mediastinal contours are within normal limits. Both lungs are clear. The visualized skeletal structures are unremarkable. IMPRESSION: No active disease. Electronically Signed   By: Marijo Conception M.D.   On: 09/25/2019 11:46    EKG: Independently reviewed.  Most recent EKG from 02/28/2019 reviewed, sinus with QTC of 503, repeat EKG pending  Assessment/Plan Principal Problem:   Lithium toxicity Active Problems:   Hypertension   Tobacco use    Severe episode of recurrent major depressive disorder, without psychotic features ([place])   Social anxiety disorder   Obesity   Leukocytosis   1. Lithium toxicity 1. Level elevated at 2.64 2. EDP discussed case with poison control who recommended continuing IV fluid hydration and following serial lithium levels every 2-4 hours, following up on lactic acid and TSH levels 3. EDP did discuss case with on-call nephrologist who recommended continued IV fluid hydration and admission to [facility] should dialysis be warranted 4. We will follow up CBC and CMP in the morning 5. Continue to follow up on serial lithium levels 6. Avoid QT prolonging meds for now 2. Hypertension 1. For stable and controlled at this time 2. Continue home regimen as tolerated 3. History of depression 1. Seems to be stable currently 4. Anxiety 1. Presently stable 5. Obesity 1. Recommend diet and lifestyle modification 6. Diabetes 1. Most recent A1c of 6.2 noted 10/30/2018 2. Patient on Metformin prior to admission, will hold while in hospital 3. Continue signs scale insulin 7. Leukocytosis 1. Unclear etiology at this time. 2. Chest x-ray reviewed, clear 3. UA reviewed, unremarkable 4. Blood cultures ordered by EDP, pending 5. Will repeat CBC in the morning  DVT prophylaxis: lovenox  Code Status: Full Family Communication: Pt in room  Disposition Plan: Admission to [place] called: Nephrology, Dr. Joelyn Oms Admission status: Inpatient as will likely require greater than 2 midnight stay to treat lithium toxicity  Marylu Lund MD [facility] Pager On Amion  If 7PM-7AM, please contact night-coverage  09/25/2019, 4:01 PM

## 2019-09-25 NOTE — TOC Initial Note (Addendum)
Transition of Care [facility]) - Initial/Assessment Note    Patient Details  Name: Denise [name] MRN: [id number] Date of Birth: Dec 16, 1970  [facility]) CM/SW Contact:    Erenest Rasher, RN Phone Number: [phone] 09/25/2019, 3:28 PM  Clinical Narrative:                  TOC CM spoke to pt's husband, Juanda Crumble. States he works part-time and needs assistance with wife. He was hoping to get both of them into a ALF. Explained he would need to apply for [place] Medicaid. Faxed  Medicaid PCS application to pt's PCP's office. Husband gave referral to assist with ALF/Memory with [facility]. Attempted call to [facility], left HIPAA complaint message.   4:28 pm PCP's office did receive PCS form to complete.     Expected Discharge Plan: Home/Self Care Barriers to Discharge: Continued Medical Work up   Patient Goals and CMS Choice Patient states their goals for this hospitalization and ongoing recovery are:: needs help at home CMS Medicare.gov Compare Post Acute Care list provided to:: Patient Represenative (must comment) (husband-Charles Headen) Choice offered to / list presented to : Spouse  Expected Discharge Plan and Services Expected Discharge Plan: Home/Self Care In-house Referral: Clinical Social Work     Living arrangements for the past 2 months: Apartment                                      Prior Living Arrangements/Services Living arrangements for the past 2 months: Apartment Lives with:: Spouse   Do you feel safe going back to the place where you live?: Yes      Need for Family Participation in Patient Care: Yes (Comment) Care giver support system in place?: Yes (comment)   Criminal Activity/Legal Involvement Pertinent to Current Situation/Hospitalization: No - Comment as needed  Activities of Daily Living      Permission Sought/Granted Permission sought to share information with : Case Manager Permission granted  to share information with : Yes, Verbal Permission Granted  Share Information with NAME: [name]  Permission granted to share info w AGENCY: PCP  Permission granted to share info w Relationship: husband  Permission granted to share info w Contact Information: [phone]  Emotional Assessment           Psych Involvement: No (comment)  Admission diagnosis:  altered mental status Patient Active Problem List   Diagnosis Date Noted  . Snoring 08/23/2019  . Severe episode of recurrent major depressive disorder, without psychotic features ([place]) 11/03/2017  . Social anxiety disorder 11/03/2017  . Tobacco use 04/14/2015  . Alcohol use 04/14/2015  . Right sided weakness   . Syncope 02/07/2015  . Pelvic pain in female 12/18/2014  . Fibroid uterus 12/18/2014  . Hypertension 12/18/2014   PCP:  [facility] Pharmacy:   [facility] [address] [place], [place] - [place] [address] Phone: [phone] Fax: [phone]  [facility] - [place], [place] - [address] [place] [address] Phone: [phone] Fax: [phone]     Social Determinants of Health ([place]) Interventions    Readmission Risk Interventions No flowsheet data found.

## 2019-09-25 NOTE — Progress Notes (Signed)
Virtual Visit via Telephone Note   I connected with Denise [name] on 09/25/19 at 9:00am by telephone and verified that I am speaking with the correct person using two identifiers.  Denise [name] reported that she does not have a smart phone or internet access, so she is unable to engage in a video session.  Additionally, Denise [name] reported that she does not have reliable transportation and numerous health issues including frequent stress induced seizures, so she prefers telephone calls for her appointments.  Clinician was unable to comment on client's general appearance, hygiene, eye contact or psychomotor activity due to being unable to physically see her in session.  I discussed the limitations, risks, security and privacy concerns of performing an evaluation and management service by telephone and the availability of in person appointments. I also discussed with the patient that there may be a patient responsible charge related to this service. The patient expressed understanding and agreed to proceed.   I discussed the assessment and treatment plan with the patient. The patient was provided an opportunity to ask questions and all were answered. The patient agreed with the plan and demonstrated an understanding of the instructions.   The patient was advised to call back or seek an in-person evaluation if the symptoms worsen or if the condition fails to improve as anticipated.   I provided 10 minutes of non-face-to-face time during this encounter.     Denise Flood, LCSW, LCAS ___________________________________ THERAPIST PROGRESS NOTE   Session Time: 9:00am - 9:10am  Location: Patient: Patient Home Provider: [facility]    Participation Level: Active   Behavioral Response: Disoriented, depressed mood   Type of Therapy:  Individual Therapy   Treatment Goals addressed: Medication management; Depression and anxiety management; Medical appointments   Interventions: CBT, crisis  intervention   Summary: Denise [name] is a 49 year old African American female who presented for telephone session today with diagnoses of Major Depressive Disorder, Recurrent Severe, with Psychotic Features; Chronic PTSD; GAD; and Cigarette Nicotine Dependence without complication.     Suicidal/Homicidal: None; without plan or intent    Therapist Response: Clinician spoke with Denise [name] for telephone appointment due to inability to meet virtually or in person and assessed for safety, sobriety, and medication compliance. Denise [name] answered phone call for session on time and spoke in a manner that was disoriented, but denying active SI/HI or A/V H. Denise [name] reported that she has not been feeling well since past week, due to experiencing body shakes/tremors which have made it difficult to move around and accomplish simple tasks like holding a spoon to eat.  She reported that she is taking medication as prescribed, and denied illicit substance use.  Denise [name] reported that this impact on daily functioning has increased her depression and she wants to be checked out by a doctor as soon as possible.  Clinician spoke with Denise [name]'s husband Denise [name] as well, who confirmed that she has sounded incoherent in speech most days, is shaking constantly, and he has to monitor her for safety throughout the day out of fear she might fall and hurt herself.  Denise [name] stated "I'm scared and its hard to sleep because I'm worried if she gets up without me around she might get hurt".  Clinician discussed safety plan with Denise [name] to assist his wife and inquired about whether he felt he could take her to the hospital himself to be assessed, or needed to call 911 for an ambulance.  Denise [name] reported that he could not drive at this time, so  911 would be best option.  Clinician ended session early after Denise [name] agreed to call 911 to have [facility] transported.  Denise [name] agreed to follow up at next session on outcome of admission to  hospital.  Clinician will continue to monitor.      Plan: Follow up again virtually in 1 week.    Diagnosis: Major Depressive Disorder, Recurrent Severe, with Psychotic Features; Chronic PTSD; GAD; and Cigarette Nicotine Dependence without complication   Denise Flood, LCSW, LCAS 09/25/19

## 2019-09-25 NOTE — ED Provider Notes (Signed)
Denise [name]   CSN: [id number] Arrival date & time: 09/25/19  1023     History Chief Complaint  Patient presents with  . Altered Mental Status    Denise [name] is a 49 y.o. female.  HPI   Patient is a 49 year old female with a history of atopic dermatitis, alcohol use, anxiety/depression, diabetes, fibroids, hyperlipidemia, hypertension, overactive bladder, CVA, seizures, substance abuse, CKD, who presents the emergency department today for evaluation of altered mental status.  Patient tells me that she is in the emergency department today because she has had tremors to her bilateral upper extremities for the last several days that are involuntary.  She is conscious if this happening.  She also reports some shortness of breath and cough and states she ran out of her inhalers.  She states she feels like her body is going downhill.  Level 5 caveat as pt has AMS.  12:09 PM Discussed case with patients husband who states patient has had increased tremors, increased memory problems, and increased fatigue. States she manages her own medications and he is not sure if she is taking them correctly. Denies any known fevers or cough. States she frequently has nausea and vomiting which is chronic for at least one year.   Past Medical History:  Diagnosis Date  . Abnormal EKG   . AD (atopic dermatitis)   . Alcohol use 04/14/2015  . Anxiety   . Blood transfusion without reported diagnosis   . Depression   . Diabetes mellitus without complication ([place])   . Fibroid uterus 12/18/2014  . Hyperlipemia   . Hypertension   . Memory changes   . Overactive bladder   . Pelvic pain in female 12/18/2014  . Prediabetes   . Right sided weakness   . Seizures ([place])   . Stroke ([place])   . Substance abuse ([place])   . Syncope 02/07/2015  . TIA (transient ischemic attack)   . Tobacco use 04/14/2015    Patient Active Problem List   Diagnosis Date  Noted  . Lithium toxicity 09/25/2019  . Snoring 08/23/2019  . Severe episode of recurrent major depressive disorder, without psychotic features ([place]) 11/03/2017  . Social anxiety disorder 11/03/2017  . Tobacco use 04/14/2015  . Alcohol use 04/14/2015  . Right sided weakness   . Syncope 02/07/2015  . Pelvic pain in female 12/18/2014  . Fibroid uterus 12/18/2014  . Hypertension 12/18/2014    Past Surgical History:  Procedure Laterality Date  . CESAREAN SECTION     x 2     OB History    Gravida  3   Para  3   Term      Preterm      AB      Living  3     SAB      TAB      Ectopic      Multiple      Live Births              Family History  Problem Relation Age of Onset  . Stroke Mother   . Anxiety disorder Mother   . Drug abuse Mother   . Alcohol abuse Mother   . Depression Mother   . Heart disease Maternal Grandmother   . Drug abuse Father   . Anxiety disorder Father   . Colon cancer Neg Hx   . Esophageal cancer Neg Hx   . Pancreatic cancer Neg Hx   . Stomach cancer Neg  Hx   . Liver disease Neg Hx     Social History   Tobacco Use  . Smoking status: Former Smoker    Packs/day: 0.50    Types: Cigarettes    Quit date: 10/26/2018    Years since quitting: 0.9  . Smokeless tobacco: Never Used  Vaping Use  . Vaping Use: Never used  Substance Use Topics  . Alcohol use: Not Currently    Alcohol/week: 0.0 standard drinks    Comment: 2 a day  . Drug use: No    Types: "Crack" cocaine    Comment: JANUARY 2017    Home Medications Prior to Admission medications   Medication Sig Start Date End Date Taking? Authorizing Provider  amLODipine (NORVASC) 10 MG tablet Take 10 mg by mouth daily.    [provider]  atorvastatin (LIPITOR) 10 MG tablet Take 10 mg by mouth at bedtime. 06/29/15   [provider]  Brexpiprazole ([name]) 0.5 MG TABS Take 1 tablet (0.5 mg total) by mouth daily. 08/30/19   Charlcie Cradle, MD  busPIRone (BUSPAR)  10 MG tablet Take 2 tablets (20 mg total) by mouth 2 (two) times daily. 08/30/19   Charlcie Cradle, MD  ibuprofen (ADVIL,MOTRIN) 200 MG tablet Take 400 mg by mouth daily as needed for mild pain.  Patient not taking: Reported on 08/30/2019    [provider]  lisinopril (PRINIVIL,ZESTRIL) 20 MG tablet Take 20 mg by mouth 2 (two) times daily.     [provider]  lithium carbonate (LITHOBID) 300 MG CR tablet Take 2 tablets (600 mg total) by mouth 2 (two) times daily. 08/30/19   Charlcie Cradle, MD  metFORMIN (GLUCOPHAGE) 500 MG tablet Take 500 mg by mouth 2 (two) times daily. 05/25/17   [provider]  Naproxen Sodium 220 MG CAPS Take 2 capsules by mouth.    [provider]  polyethylene glycol powder (GLYCOLAX/MIRALAX) powder Mix 17 g with 4-8 ounces of juice and drink once a day Patient not taking: Reported on 08/30/2019 06/26/15   [provider]  sertraline (ZOLOFT) 100 MG tablet Take 2 tablets (200 mg total) by mouth every morning. 08/30/19 11/28/19  Charlcie Cradle, MD    Allergies    Grapefruit flavor [flavoring agent]  Review of Systems   Review of Systems  Reason unable to perform ROS: difficult historian.  Constitutional:       Tremors  Respiratory: Positive for cough.     Physical Exam Updated Vital Signs BP 101/76 (BP Location: Right Arm)   Pulse 74   Temp 100.2 F (37.9 C) (Rectal)   Resp 20   LMP 05/29/2017 (Approximate)   SpO2 92%   Physical Exam Vitals and nursing [name] reviewed.  Constitutional:      General: She is not in acute distress.    Appearance: She is well-developed.     Comments: Tremors noted to the bilat upper extremities  HENT:     Head: Normocephalic and atraumatic.  Eyes:     Conjunctiva/sclera: Conjunctivae normal.  Cardiovascular:     Rate and Rhythm: Normal rate and regular rhythm.     Heart sounds: Normal heart sounds. No murmur heard.   Pulmonary:     Effort: Pulmonary effort is normal. No respiratory  distress.     Breath sounds: Wheezing and rales present.  Abdominal:     General: Bowel sounds are normal.     Palpations: Abdomen is soft.     Tenderness: There is no abdominal tenderness. There  is no guarding or rebound.  Musculoskeletal:     Cervical back: Neck supple.  Skin:    General: Skin is warm and dry.  Neurological:     Mental Status: She is alert.     Comments: Mental Status:  Alert, thought content appropriate, able to give a coherent history. Speech fluent without evidence of aphasia. Able to follow 2 step commands without difficulty.  Cranial Nerves:  II:  pupils equal, round, reactive to light III,IV, VI: ptosis not present, extra-ocular motions intact bilaterally  V,VII: smile symmetric, facial light touch sensation equal VIII: hearing grossly normal to voice  X: uvula elevates symmetrically  XI: bilateral shoulder shrug symmetric and strong XII: midline tongue extension without fassiculations Motor:  Normal tone. 5/5 strength of BUE and BLE major muscle groups including strong and equal grip strength and dorsiflexion/plantar flexion Sensory: light touch normal in all extremities. Patellar DTRs are hypereflexic     ED Results / Procedures / Treatments   Labs (all labs ordered are listed, but only abnormal results are displayed) Labs Reviewed  COMPREHENSIVE METABOLIC PANEL - Abnormal; Notable for the following components:      Result Value   Glucose, Bld 107 (*)    All other components within normal limits  CBC - Abnormal; Notable for the following components:   WBC 14.3 (*)    All other components within normal limits  LITHIUM LEVEL - Abnormal; Notable for the following components:   Lithium Lvl 2.64 (*)    All other components within normal limits  CBG MONITORING, ED - Abnormal; Notable for the following components:   Glucose-Capillary 101 (*)    All other components within normal limits  SARS CORONAVIRUS 2 BY RT PCR (HOSPITAL ORDER, [place])  CULTURE, BLOOD (ROUTINE X 2)  CULTURE, BLOOD (ROUTINE X 2)  URINALYSIS, ROUTINE W REFLEX MICROSCOPIC  HCG, QUANTITATIVE, PREGNANCY  RAPID URINE DRUG SCREEN, HOSP PERFORMED  LACTIC ACID, PLASMA  LACTIC ACID, PLASMA  TSH  ETHANOL  SALICYLATE LEVEL  ACETAMINOPHEN LEVEL  LITHIUM LEVEL    EKG None   EKG with NSR, no acute ischemic changes, QTc 409  Radiology CT Head Wo Contrast  Result Date: 09/25/2019 CLINICAL DATA:  Delirium. Additional history provided: Confusion for 2 days, frontal headache for 2 days. EXAM: CT HEAD WITHOUT CONTRAST TECHNIQUE: Contiguous axial images were obtained from the base of the skull through the vertex without intravenous contrast. COMPARISON:  Brain MRI 05/19/2017, head CT 05/19/2017. FINDINGS: Brain: Cerebral volume is normal. Redemonstrated chronic left basal ganglia infarct. There is no acute intracranial hemorrhage. No demarcated cortical infarct. No extra-axial fluid collection. No evidence of intracranial mass. No midline shift. Partially empty sella turcica. Vascular: No hyperdense vessel.  Atherosclerotic calcifications Skull: Normal. Negative for fracture or focal lesion. Sinuses/Orbits: Visualized orbits show no acute finding. Mild ethmoid sinus mucosal thickening. No significant mastoid effusion. IMPRESSION: No CT evidence of acute intracranial abnormality. Redemonstrated chronic left basal ganglia infarct. Partially empty sella turcica. This is very commonly an incidental finding, but can be associated with idiopathic intracranial hypertension. Mild ethmoid sinus mucosal thickening. Electronically Signed   By: Kellie Simmering DO   On: 09/25/2019 13:43   DG Chest Portable 1 View  Result Date: 09/25/2019 CLINICAL DATA:  Cough. EXAM: PORTABLE CHEST 1 VIEW COMPARISON:  February 28, 2019. FINDINGS: The heart size and mediastinal contours are within normal limits. Both lungs are clear. The visualized skeletal structures are unremarkable.  IMPRESSION: No active disease.  Electronically Signed   By: Marijo Conception M.D.   On: 09/25/2019 11:46    Procedures Procedures (including critical care time)  CRITICAL CARE Performed by: Rodney Booze   Total critical care time: 35 minutes  Critical care time was exclusive of separately billable procedures and treating other patients.  Critical care was necessary to treat or prevent imminent or life-threatening deterioration.  Critical care was time spent personally by me on the following activities: development of treatment plan with patient and/or surrogate as well as nursing, discussions with consultants, evaluation of patient's response to treatment, examination of patient, obtaining history from patient or surrogate, ordering and performing treatments and interventions, ordering and review of laboratory studies, ordering and review of radiographic studies, pulse oximetry and re-evaluation of patient's condition.   Medications Ordered in ED Medications  0.9 %  sodium chloride infusion (has no administration in time range)  albuterol (VENTOLIN HFA) 108 (90 Base) MCG/ACT inhaler 2 puff (2 puffs Inhalation Given 09/25/19 1233)  sodium chloride 0.9 % bolus 500 mL (0 mLs Intravenous Stopped 09/25/19 1501)  sodium chloride 0.9 % bolus 2,000 mL (2,000 mLs Intravenous New Bag/Given 09/25/19 1532)    ED Course  I have reviewed the triage vital signs and the nursing notes.  Pertinent labs & imaging results that were available during my care of the patient were reviewed by me and considered in my medical decision making (see chart for details).    MDM Rules/Calculators/A&P                          49 year old female presenting for evaluation of increased confusion, tremors, ambulatory dysfunction.  Reviewed/interpreted labs CBC shows a leukocytosis of 14,000 CMP with normal electrolytes kidney and liver function Covid test is negative Beta-hCG negative UA negative Lithium level  is elevated  Patient was started on additional 500 cc IV fluid bolus.  3:10 PM Discussed case with Janett Billow from poison control. She recommends adding a repeat lithium level, adding a lactic acid, TSH and rechecking lithium every 2-4 hours. Also recommends obtaining an EKG to evaluate the QTC. Recommends adding additional tox screen labs to evaluate for possible coingestions. She spoke with toxicologist and states that at this time patient may be monitored and have a repeat lithium level with aggressive fluid hydration. If she does not show any clinical improvement or has any deterioration in her mental status then there should be a low threshold to consider dialysis.  Additional 2L IVF bolus ordered as well as maitinence IVF  3:26 PM CONSULT with Dr. Joelyn Oms with nephrology who will eval pt. Recommends aggressive IVF hydration and admission to [facility].   3:49 PM CONSULT With Dr. Wyline Copas with hospitalist service who accepts patient    Final Clinical Impression(s) / ED Diagnoses Final diagnoses:  Lithium toxicity, accidental or unintentional, initial encounter    Rx / DC Orders ED Discharge Orders    None       Bishop Dublin 09/25/19 1551    LongWonda Olds, MD 09/26/19 0800

## 2019-09-25 NOTE — Consult Note (Signed)
Denise [name] Admit Date: 09/25/2019 09/25/2019 Rexene Agent Requesting Physician:  Wyline Copas MD  Reason for Consult:  Lithium toxicity HPI:  63F PMH complex psychiatric disease, depression/anxiety on lithium chronically brought into the emergency room today by family because of confusion, periodic unresponsive episodes.  Work-up revealed negative head CT for acute process.  She was found to have a lithium level of 2.64, now on repeat is 2.45.  She has normal GFR.  In the ER she has been able to answer questions appropriately for the most part.  Further, has had periodic twitching/myoclonus.  She is alone in the room at the time of my evaluation.  She has no complaints.  She states that she did not take any extra doses of lithium.  She is oriented to self, year but not city, building.  Home meds appear to include low lithium 1200 mg total daily dose, Metformin, naproxen, lisinopril.  No N/V.  No diarrhea.   Creatinine, Ser (mg/dL)  Date Value  09/25/2019 0.84  02/28/2019 0.66  10/30/2018 0.80  11/09/2017 0.86  07/26/2017 0.70  05/19/2017 0.70  05/19/2017 0.75  12/11/2016 0.69  06/14/2016 0.68  04/13/2016 0.68  ] ROS Balance of 12 systems is negative w/ exceptions as above  PMH  Past Medical History:  Diagnosis Date  . Abnormal EKG   . AD (atopic dermatitis)   . Alcohol use 04/14/2015  . Anxiety   . Blood transfusion without reported diagnosis   . Depression   . Diabetes mellitus without complication ([place])   . Fibroid uterus 12/18/2014  . Hyperlipemia   . Hypertension   . Memory changes   . Overactive bladder   . Pelvic pain in female 12/18/2014  . Prediabetes   . Right sided weakness   . Seizures ([place])   . Stroke ([place])   . Substance abuse ([place])   . Syncope 02/07/2015  . TIA (transient ischemic attack)   . Tobacco use 04/14/2015   PSH  Past Surgical History:  Procedure Laterality Date  . CESAREAN SECTION     x 2   FH  Family History  Problem  Relation Age of Onset  . Stroke Mother   . Anxiety disorder Mother   . Drug abuse Mother   . Alcohol abuse Mother   . Depression Mother   . Heart disease Maternal Grandmother   . Drug abuse Father   . Anxiety disorder Father   . Colon cancer Neg Hx   . Esophageal cancer Neg Hx   . Pancreatic cancer Neg Hx   . Stomach cancer Neg Hx   . Liver disease Neg Hx    SH  reports that she quit smoking about 10 months ago. Her smoking use included cigarettes. She smoked 0.50 packs per day. She has never used smokeless tobacco. She reports previous alcohol use. She reports that she does not use drugs. Allergies  Allergies  Allergen Reactions  . Grapefruit Flavor [Flavoring Agent] Other (See Comments)    Drug interaction   Home medications Prior to Admission medications   Medication Sig Start Date End Date Taking? Authorizing Provider  amLODipine (NORVASC) 10 MG tablet Take 10 mg by mouth daily.   Yes [provider]  atorvastatin (LIPITOR) 10 MG tablet Take 10 mg by mouth at bedtime. 06/29/15  Yes [provider]  Brexpiprazole ([name]) 0.5 MG TABS Take 1 tablet (0.5 mg total) by mouth daily. 08/30/19  Yes Charlcie Cradle, MD  busPIRone (BUSPAR) 10 MG tablet Take 2 tablets (20  mg total) by mouth 2 (two) times daily. 08/30/19  Yes Charlcie Cradle, MD  gabapentin (NEURONTIN) 300 MG capsule Take 300 mg by mouth 3 (three) times daily.   Yes [provider]  hydrochlorothiazide (HYDRODIURIL) 12.5 MG tablet Take 12.5 mg by mouth daily.   Yes [provider]  lisinopril (PRINIVIL,ZESTRIL) 20 MG tablet Take 20 mg by mouth 2 (two) times daily.    Yes [provider]  lithium carbonate (LITHOBID) 300 MG CR tablet Take 2 tablets (600 mg total) by mouth 2 (two) times daily. 08/30/19  Yes Charlcie Cradle, MD  metFORMIN (GLUCOPHAGE) 500 MG tablet Take 500 mg by mouth 2 (two) times daily. 05/25/17  Yes [provider]  Naproxen Sodium 220 MG CAPS Take 2 capsules by  mouth daily.    Yes [provider]  sertraline (ZOLOFT) 100 MG tablet Take 2 tablets (200 mg total) by mouth every morning. 08/30/19 11/28/19 Yes Charlcie Cradle, MD    Current Medications Scheduled Meds: . enoxaparin (LOVENOX) injection  40 mg Subcutaneous Q24H  . insulin aspart  0-15 Units Subcutaneous TID WC  . insulin aspart  0-5 Units Subcutaneous QHS   Continuous Infusions: . sodium chloride    . sodium chloride     PRN Meds:.acetaminophen **OR** acetaminophen, ondansetron (ZOFRAN) IV  CBC Recent Labs  Lab 09/25/19 1109  WBC 14.3*  HGB 13.1  HCT 40.6  MCV 91.6  PLT 536   Basic Metabolic Panel Recent Labs  Lab 09/25/19 1109  NA 137  K 4.3  CL 103  CO2 27  GLUCOSE 107*  BUN 16  CREATININE 0.84  CALCIUM 9.9    Physical Exam  Blood pressure 101/76, pulse 74, temperature 100.2 F (37.9 C), temperature source Rectal, resp. rate 20, last menstrual period 05/29/2017, SpO2 92 %. GEN: Obese, NAD, chronically ill-appearing NCAT EOMI Regular, normal S1 and S2 Clear bilaterally Soft, nontender No rashes or lesions NCAT   Assessment 68F moderate lithium toxicity, mild CNS symptoms, normal GFR.  I think that with aggressive hydration her lithium levels will improve.  If her lithium level increases or her neurological status worsens she will need reconsideration for dialysis.  Because of that, I think that she should be admitted to [facility].  1. Lithium toxicity, moderate, with some CNS symptoms in the form of myoclonus and disorientation; some baseline disorientation per report 2. Complex psychiatric history including depression/anxiety on lithium 3. Hypertension 4. Obesity 5. History of CVA  Plan 1. As above, after 3 L of normal saline bolus, continue NS at 150 mL/h 2. Trend lithium levels every 4 hours, notify nephrology if level increases to greater than 2.5 3. Admit to [facility] 4. Neuro checks q4h 5. Daily weights, Daily Renal Panel,  Strict I/Os, Avoid nephrotoxins (NSAIDs, judicious IV Contrast)    Rexene Agent  09/25/2019, 4:49 PM

## 2019-09-26 DIAGNOSIS — E669 Obesity, unspecified: Secondary | ICD-10-CM

## 2019-09-26 LAB — GLUCOSE, CAPILLARY
Glucose-Capillary: 114 mg/dL — ABNORMAL HIGH (ref 70–99)
Glucose-Capillary: 122 mg/dL — ABNORMAL HIGH (ref 70–99)
Glucose-Capillary: 143 mg/dL — ABNORMAL HIGH (ref 70–99)
Glucose-Capillary: 113 mg/dL — ABNORMAL HIGH (ref 70–99)

## 2019-09-26 LAB — LITHIUM LEVEL
Lithium Lvl: 1.78 mmol/L (ref 0.60–1.20)
Lithium Lvl: 1.66 mmol/L (ref 0.60–1.20)
Lithium Lvl: 1.48 mmol/L — ABNORMAL HIGH (ref 0.60–1.20)
Lithium Lvl: 1.35 mmol/L — ABNORMAL HIGH (ref 0.60–1.20)

## 2019-09-26 MED ORDER — SODIUM CHLORIDE 0.9 % IV SOLN: INTRAVENOUS | Status: AC

## 2019-09-26 MED ORDER — ENOXAPARIN SODIUM 80 MG/0.8ML ~~LOC~~ SOLN
0.5000 mg/kg | SUBCUTANEOUS | Status: DC
  Administered 2019-09-26: 67.5 mg via SUBCUTANEOUS
  Filled 2019-09-26: qty 0.8

## 2019-09-26 NOTE — Plan of Care (Signed)
  Problem: Safety: Goal: Ability to remain free from injury will improve Outcome: Progressing   

## 2019-09-26 NOTE — Evaluation (Signed)
Physical Therapy Evaluation Patient Details Name: Denise [name] MRN: [id number] DOB: [birth year] Today's Date: 09/26/2019   History of Present Illness  49 y.o. female with medical history significant of dementia, anxiety, hyperlipidemia, obesity, hypertension, history of TIA who presented to the emergency department brought by family with mental status change.  Pt found to have lithium toxicity.  Clinical Impression  Pt presents to PT with deficits in balance, mobility and cognition, although she may be near her functional baseline. Pt does not require physical assistance for household level mobility at this time and reports she feels near her baseline. Cognition may still be affected as the pt is unable to state whether her hometown is more [place] or [place] in [place]. Pt will continue to benefit from acute PT POC to aide in a complete return to independence.    Follow Up Recommendations No PT follow up    Equipment Recommendations  None recommended by PT    Recommendations for Other Services       Precautions / Restrictions Precautions Precautions: Fall Restrictions Weight Bearing Restrictions: No      Mobility  Bed Mobility Overal bed mobility: Needs Assistance Bed Mobility: Supine to Sit     Supine to sit: Supervision;HOB elevated     General bed mobility comments: use of rails  Transfers Overall transfer level: Needs assistance Equipment used: None Transfers: Sit to/from [employer] Transfers Sit to Stand: Supervision Stand pivot transfers: Supervision          Ambulation/Gait Ambulation/Gait assistance: Supervision Gait Distance (Feet): 100 Feet Assistive device: None Gait Pattern/deviations: Step-through pattern;Wide base of support Gait velocity: reduced Gait velocity interpretation: 1.31 - 2.62 ft/sec, indicative of limited community ambulator General Gait Details: pt with shortened step through gait, widened BOS  Stairs             Wheelchair Mobility    Modified Rankin (Stroke Patients Only)       Balance Overall balance assessment: Needs assistance Sitting-balance support: No upper extremity supported;Feet supported Sitting balance-Leahy Scale: Good     Standing balance support: No upper extremity supported;During functional activity Standing balance-Leahy Scale: Good Standing balance comment: supervision                             Pertinent Vitals/Pain Pain Assessment: No/denies pain    Home Living Family/patient expects to be discharged to:: Private residence Living Arrangements: Spouse/significant other Available Help at Discharge: Family;Available 24 hours/day Type of Home: Other(Comment) (kitchenette per pt) Home Access: Level entry     Home Layout: One level Home Equipment: None      Prior Function Level of Independence: Independent               Hand Dominance        Extremity/Trunk Assessment   Upper Extremity Assessment Upper Extremity Assessment: Overall WFL for tasks assessed    Lower Extremity Assessment Lower Extremity Assessment: Overall WFL for tasks assessed    Cervical / Trunk Assessment Cervical / Trunk Assessment: Other exceptions Cervical / Trunk Exceptions: morbid obesity  Communication   Communication: No difficulties  Cognition Arousal/Alertness: Awake/alert Behavior During Therapy: WFL for tasks assessed/performed Overall Cognitive Status: No family/caregiver present to determine baseline cognitive functioning                                 General Comments: pt reports being from a small  town in [place], unable to state whether the town is in [place] or [place] comments (skin integrity, edema, etc.): VSS on RA    Exercises     Assessment/Plan    PT Assessment Patient needs continued PT services  PT Problem List Decreased activity tolerance;Decreased balance;Decreased  mobility       PT Treatment Interventions Gait training;Functional mobility training;Balance training;Patient/family education    PT Goals (Current goals can be found in the Care Plan section)  Acute Rehab PT Goals Patient Stated Goal: To go home PT Goal Formulation: With patient Time For Goal Achievement: 10/10/19 Potential to Achieve Goals: Good    Frequency Min 3X/week   Barriers to discharge        Co-evaluation               AM-PAC PT "6 Clicks" Mobility  Outcome Measure Help needed turning from your back to your side while in a flat bed without using bedrails?: None Help needed moving from lying on your back to sitting on the side of a flat bed without using bedrails?: None Help needed moving to and from a bed to a chair (including a wheelchair)?: None Help needed standing up from a chair using your arms (e.g., wheelchair or bedside chair)?: None Help needed to walk in hospital room?: None Help needed climbing 3-5 steps with a railing? : A Little 6 Click Score: 23    End of Session   Activity Tolerance: Patient tolerated treatment well Patient left: in chair;with call bell/phone within reach;with chair alarm set Nurse Communication: Mobility status PT Visit Diagnosis: Other abnormalities of gait and mobility (R26.89)    Time: 9924-2683 PT Time Calculation (min) (ACUTE ONLY): 20 min   Charges:   PT Evaluation $PT Eval Low Complexity: 1 Low          Zenaida Niece, PT, DPT Acute Rehabilitation Pager: [phone]   Zenaida Niece 09/26/2019, 1:01 PM

## 2019-09-26 NOTE — Progress Notes (Signed)
Called laboratory to asked if lithium level was drawn for 09/25/19 22:00, based on the chart review it says need to be collected. Lab personnel said that lithuim level next lab drawn to be at 09/26/19 10:00am. I told them that in our computer it showed lithium level lab drawn for 09/25/19 2200 pm, confirmed with CN Lexine Baton,  lab personnel said its not  showing in their computer. Per lab we have to re order it for them to draw lithium level. CN Lexine Baton put a new order stat now and every 4 hours.

## 2019-09-26 NOTE — Progress Notes (Signed)
I concur with documentation by Nursing Student Betsy Pries

## 2019-09-26 NOTE — Progress Notes (Signed)
PROGRESS NOTE    Denise [name]  YCX:[id number] DOB: Aug 12, 1970 DOA: 09/25/2019 PCP: [facility]  Brief Narrative:78F PMH complex psychiatric disease, depression/anxiety on lithium chronically brought into the emergency room today by family because of confusion, periodic unresponsive episodes, shakes and tremors, ataxia.  Work-up revealed negative head CT for acute process.  She was found to have a lithium level of 2.64, kidney function was normal   Assessment & Plan:   Lithium toxicity -Lithium level was elevated 2.64 with symptoms on admission, now improving -Continue IV fluids today, lithium level down to 1.6 this morning, no indication for dialysis, kidney function is normal -Lithium discontinued -Recheck lithium level and BMP in a.m. -Physical therapy evaluation  Hypertension -Stable, continue lisinopril  Anxiety/depression/mood disorder -Psych consulted, discussed with Dr. Dwyane Dee recommended discharge home off lithium and follow-up with Dr. Doyne Keel in 1 week for alternate mood stabilizers -Stable at this time  Type 2 diabetes mellitus -Metformin on hold, continue sliding scale insulin  Mild leukocytosis -Likely reactive will repeat labs  DVT prophylaxis: Lovenox Code Status: Full code Family Communication: No family at bedside, will update spouse Disposition Plan:  Status is: Inpatient  Remains inpatient appropriate because:Inpatient level of care appropriate due to severity of illness   Dispo: The patient is from: Home              Anticipated d/c is to: Home              Anticipated d/c date is: 1 day              Patient currently is not medically stable to d/c.  Consultants:  Renal and psychiatry  Procedures:   Antimicrobials:    Subjective: -Feels much better today, mind is clearer, tremors and shakes have improved  Objective: Vitals:   09/25/19 2001 09/26/19 0015 09/26/19 0436 09/26/19 0920  BP: (!) 144/74  140/65 135/79 118/73  Pulse: 80 76 75 72  Resp: 18 18 18 16   Temp: 98.7 F (37.1 C) 98.7 F (37.1 C) 97.8 F (36.6 C) 99.2 F (37.3 C)  TempSrc: Oral Oral Oral Oral  SpO2: 91% 90% 94% 100%  Weight:      Height:        Intake/Output Summary (Last 24 hours) at 09/26/2019 1515 Last data filed at 09/26/2019 1235 Gross per 24 hour  Intake 2248.99 ml  Output 1830 ml  Net 418.99 ml   Filed Weights   09/25/19 1716  Weight: 135.9 kg    Examination:  General exam: Pleasant female with cognitive deficits sitting up in bed, awake alert oriented to self place and partly to time HEENT: No JVD CVS: S1-S2, regular rate rhythm Lungs: Clear bilaterally Abdomen: Soft, nontender, bowel sounds present Extremities: No edema  Psychiatry: Poor insight and judgment    Data Reviewed:   CBC: Recent Labs  Lab 09/25/19 1109  WBC 14.3*  HGB 13.1  HCT 40.6  MCV 91.6  PLT 497   Basic Metabolic Panel: Recent Labs  Lab 09/25/19 1109  NA 137  K 4.3  CL 103  CO2 27  GLUCOSE 107*  BUN 16  CREATININE 0.84  CALCIUM 9.9   GFR: Estimated Creatinine Clearance: 110.8 mL/min (by C-G formula based on SCr of 0.84 mg/dL). Liver Function Tests: Recent Labs  Lab 09/25/19 1109  AST 24  ALT 20  ALKPHOS 110  BILITOT 0.3  PROT 7.0  ALBUMIN 4.2   No results for input(s): LIPASE, AMYLASE in the last  168 hours. No results for input(s): AMMONIA in the last 168 hours. Coagulation Profile: No results for input(s): INR, PROTIME in the last 168 hours. Cardiac Enzymes: No results for input(s): CKTOTAL, CKMB, CKMBINDEX, TROPONINI in the last 168 hours. BNP (last 3 results) No results for input(s): PROBNP in the last 8760 hours. HbA1C: Recent Labs    09/25/19 1107  HGBA1C 5.4   CBG: Recent Labs  Lab 09/25/19 1239 09/25/19 2225 09/26/19 0733 09/26/19 1132  GLUCAP 101* 111* 114* 122*   Lipid Profile: No results for input(s): CHOL, HDL, LDLCALC, TRIG, CHOLHDL, LDLDIRECT in the last 72  hours. Thyroid Function Tests: Recent Labs    09/25/19 1532  TSH 5.147*   Anemia Panel: No results for input(s): VITAMINB12, FOLATE, FERRITIN, TIBC, IRON, RETICCTPCT in the last 72 hours. Urine analysis:    Component Value Date/Time   COLORURINE YELLOW 09/25/2019 1340   APPEARANCEUR CLEAR 09/25/2019 1340   LABSPEC 1.014 09/25/2019 1340   PHURINE 6.0 09/25/2019 1340   GLUCOSEU NEGATIVE 09/25/2019 1340   HGBUR NEGATIVE 09/25/2019 1340   BILIRUBINUR NEGATIVE 09/25/2019 1340   KETONESUR NEGATIVE 09/25/2019 1340   PROTEINUR NEGATIVE 09/25/2019 1340   UROBILINOGEN 0.2 11/22/2014 0240   NITRITE NEGATIVE 09/25/2019 1340   LEUKOCYTESUR NEGATIVE 09/25/2019 1340   Sepsis Labs: @LABRCNTIP (procalcitonin:4,lacticidven:4)  ) Recent Results (from the past 240 hour(s))  SARS Coronavirus 2 by RT PCR (hospital order, performed in [facility] hospital lab) Nasopharyngeal Nasopharyngeal Swab     Status: None   Collection Time: 09/25/19 11:09 AM   Specimen: Nasopharyngeal Swab  Result Value Ref Range Status   SARS Coronavirus 2 NEGATIVE NEGATIVE Final    Comment: (NOTE) SARS-CoV-2 target nucleic acids are NOT DETECTED.  The SARS-CoV-2 RNA is generally detectable in upper and lower respiratory specimens during the acute phase of infection. The lowest concentration of SARS-CoV-2 viral copies this assay can detect is 250 copies / mL. A negative result does not preclude SARS-CoV-2 infection and should not be used as the sole basis for treatment or other patient management decisions.  A negative result may occur with improper specimen collection / handling, submission of specimen other than nasopharyngeal swab, presence of viral mutation(s) within the areas targeted by this assay, and inadequate number of viral copies (<250 copies / mL). A negative result must be combined with clinical observations, patient history, and epidemiological information.  Fact Sheet for Patients:     StrictlyIdeas.no  Fact Sheet for Healthcare Providers: BankingDealers.co.za  This test is not yet approved or  cleared by the [place] FDA and has been authorized for detection and/or diagnosis of SARS-CoV-2 by FDA under an Emergency Use Authorization (EUA).  This EUA will remain in effect (meaning this test can be used) for the duration of the COVID-19 declaration under Section 564(b)(1) of the Act, 21 U.S.C. section 360bbb-3(b)(1), unless the authorization is terminated or revoked sooner.  Performed at [facility], [place] [address], [place], [place] [address]   Blood culture (routine x 2)     Status: None (Preliminary result)   Collection Time: 09/25/19  5:00 PM   Specimen: BLOOD RIGHT HAND  Result Value Ref Range Status   Specimen Description   Final    BLOOD RIGHT HAND Performed at [facility] [address] [place], [place] [address]    Special Requests   Final    BOTTLES DRAWN AEROBIC AND ANAEROBIC Blood Culture adequate volume Performed at [facility] [address]  [place], [place] [address]    Culture   Final    NO GROWTH < 12 HOURS Performed at [facility] [address] [place],  [address]    Report Status PENDING  Incomplete         Radiology Studies: CT Head Wo Contrast  Result Date: 09/25/2019 CLINICAL DATA:  Delirium. Additional history provided: Confusion for 2 days, frontal headache for 2 days. EXAM: CT HEAD WITHOUT CONTRAST TECHNIQUE: Contiguous axial images were obtained from the base of the skull through the vertex without intravenous contrast. COMPARISON:  Brain MRI 05/19/2017, head CT 05/19/2017. FINDINGS: Brain: Cerebral volume is normal. Redemonstrated chronic left basal ganglia infarct. There is no acute intracranial hemorrhage. No demarcated cortical infarct. No extra-axial fluid collection. No evidence of intracranial  mass. No midline shift. Partially empty sella turcica. Vascular: No hyperdense vessel.  Atherosclerotic calcifications Skull: Normal. Negative for fracture or focal lesion. Sinuses/Orbits: Visualized orbits show no acute finding. Mild ethmoid sinus mucosal thickening. No significant mastoid effusion. IMPRESSION: No CT evidence of acute intracranial abnormality. Redemonstrated chronic left basal ganglia infarct. Partially empty sella turcica. This is very commonly an incidental finding, but can be associated with idiopathic intracranial hypertension. Mild ethmoid sinus mucosal thickening. Electronically Signed   By: Kellie Simmering DO   On: 09/25/2019 13:43   DG Chest Portable 1 View  Result Date: 09/25/2019 CLINICAL DATA:  Cough. EXAM: PORTABLE CHEST 1 VIEW COMPARISON:  February 28, 2019. FINDINGS: The heart size and mediastinal contours are within normal limits. Both lungs are clear. The visualized skeletal structures are unremarkable. IMPRESSION: No active disease. Electronically Signed   By: Marijo Conception M.D.   On: 09/25/2019 11:46        Scheduled Meds: . amLODipine  10 mg Oral Daily  . atorvastatin  10 mg Oral QHS  . enoxaparin (LOVENOX) injection  0.5 mg/kg Subcutaneous Q24H  . insulin aspart  0-15 Units Subcutaneous TID WC  . insulin aspart  0-5 Units Subcutaneous QHS  . lisinopril  20 mg Oral BID   Continuous Infusions: . sodium chloride 75 mL/hr at 09/26/19 0909     LOS: 1 day    Time spent: 47min    Domenic Polite, MD [facility]  09/26/2019, 3:15 PM

## 2019-09-26 NOTE — Progress Notes (Signed)
Admit: 09/25/2019 LOS: 1  Denise [name] with AoC lithium toxicity  Subjective:  . Lithium level improved overnight with hydration . More Aake this AM, AAO to self, year, location; no myoclonus . [facility] [place] consulted psych to revise meds   08/31 0701 - 09/01 0700 In: 2227 [P.O.:240; I.V.:1487; IV Piggyback:500] Out: 4628 [Urine:1830]  Filed Weights   09/25/19 1716  Weight: 135.9 kg    Scheduled Meds: . amLODipine  10 mg Oral Daily  . atorvastatin  10 mg Oral QHS  . enoxaparin (LOVENOX) injection  0.5 mg/kg Subcutaneous Q24H  . insulin aspart  0-15 Units Subcutaneous TID WC  . insulin aspart  0-5 Units Subcutaneous QHS  . lisinopril  20 mg Oral BID   Continuous Infusions: . sodium chloride 75 mL/hr at 09/26/19 0909   PRN Meds:.acetaminophen **OR** acetaminophen, ondansetron [facility]) IV  Current Labs: reviewed  Results for MACKINZEE, ROSZAK (MRN [id number]) as of 09/26/2019 11:15  Ref. Range 09/25/2019 11:09 09/25/2019 15:32 09/25/2019 17:40 09/26/2019 01:27 09/26/2019 05:07 09/26/2019 09:16  Lithium Latest Ref Range: 0.60 - 1.20 mmol/L 2.64 (HH) 2.45 (HH) 2.19 (HH) 1.78 (HH) 1.66 (HH) 1.48 (H)    Physical Exam:  Blood pressure 118/73, pulse 72, temperature 99.2 F (37.3 C), temperature source Oral, resp. rate 16, height 5\' 5"  (1.651 m), weight 135.9 kg, last menstrual period 05/29/2017, SpO2 100 %. NAD< awake, alert conversant AAO to self, location, year, date No myoclonus RRR CTAB No LEE  A 1. Lithium toxicity, moderate, with some CNS symptoms in the form of myoclonus and disorientation; some baseline disorientation per report; improved with hydration; did not req HD 2. Complex psychiatric history including depression/anxiety on lithium, pyschiatry consulted 3. Hypertension 4. Obesity 5. History of CVA   P . No HD will be needed . Cont hydraton until [place] level <1 . Will sign off for now.  Please call with any questions or concerns.  Pt does not need follow up with nephrology      Pearson Grippe MD 09/26/2019, 11:14 AM  Recent Labs  Lab 09/25/19 1109  NA 137  K 4.3  CL 103  CO2 27  GLUCOSE 107*  BUN 16  CREATININE 0.84  CALCIUM 9.9   Recent Labs  Lab 09/25/19 1109  WBC 14.3*  HGB 13.1  HCT 40.6  MCV 91.6  PLT 385

## 2019-09-26 NOTE — TOC Progression Note (Signed)
Transition of Care [facility]) - Progression Note    Patient Details  Name: Denise [name] MRN: [id number] Date of Birth: 1970-08-17  [facility] CM/SW Contact  Bartholomew Crews, RN Phone Number: [phone] 09/26/2019, 3:28 PM  Clinical Narrative:     Spoke with patient at the bedside. PTA home with spouse. States that her spouse does everything for her from assisting with adls and Iadls for meal prep and cleaning. Patient would like to have PCS through her Medicaid. Discussed home care agency choice - [facility], [facility]. PCS form completed and faxed to expedited line at [employer]. Mini assessment completed with this RN. Accepting home care agency to follow up with patient. Patient notified and expressed appreciation. Patient advised that Janeece Riggers will do a more comprehensive assessment in about 2 weeks. TOC following for transition needs.   Expected Discharge Plan: Home/Self Care Barriers to Discharge: Continued Medical Work up  Expected Discharge Plan and Services Expected Discharge Plan: Home/Self Care In-house Referral: Clinical Social Work Discharge Planning Services: CM Consult Post Acute Care Choice: Home Health (home care providers) Living arrangements for the past 2 months: Apartment                 DME Arranged: N/A DME Agency: NA       HH Arranged: PCS/Personal Care Services           Social Determinants of Health (SDOH) Interventions    Readmission Risk Interventions No flowsheet data found.

## 2019-09-26 NOTE — Plan of Care (Signed)
  Problem: Clinical Measurements: Goal: Ability to maintain clinical measurements within normal limits will improve Outcome: Progressing   

## 2019-09-27 ENCOUNTER — Telehealth (HOSPITAL_COMMUNITY): Payer: Medicaid Other | Admitting: Psychiatry

## 2019-09-27 LAB — BLOOD CULTURE ID PANEL (REFLEXED) - BCID2

## 2019-09-27 LAB — CBC
HCT: 37.7 % (ref 36.0–46.0)
Hemoglobin: 11.8 g/dL — ABNORMAL LOW (ref 12.0–15.0)
MCH: 28.1 pg (ref 26.0–34.0)
MCHC: 31.3 g/dL (ref 30.0–36.0)
MCV: 89.8 fL (ref 80.0–100.0)
Platelets: 307 10*3/uL (ref 150–400)
RBC: 4.2 MIL/uL (ref 3.87–5.11)
RDW: 15.1 % (ref 11.5–15.5)
WBC: 12.8 10*3/uL — ABNORMAL HIGH (ref 4.0–10.5)
nRBC: 0 % (ref 0.0–0.2)

## 2019-09-27 LAB — LITHIUM LEVEL: Lithium Lvl: 1 mmol/L (ref 0.60–1.20)

## 2019-09-27 LAB — GLUCOSE, CAPILLARY: Glucose-Capillary: 130 mg/dL — ABNORMAL HIGH (ref 70–99)

## 2019-09-27 LAB — BASIC METABOLIC PANEL
Anion gap: 10 (ref 5–15)
BUN: 5 mg/dL — ABNORMAL LOW (ref 6–20)
CO2: 23 mmol/L (ref 22–32)
Calcium: 9.6 mg/dL (ref 8.9–10.3)
Chloride: 106 mmol/L (ref 98–111)
Creatinine, Ser: 0.72 mg/dL (ref 0.44–1.00)
GFR calc Af Amer: >60 mL/min (ref >60)
GFR calc non Af Amer: >60 mL/min (ref >60)
Glucose, Bld: 114 mg/dL — ABNORMAL HIGH (ref 70–99)
Potassium: 4 mmol/L (ref 3.5–5.1)
Sodium: 139 mmol/L (ref 135–145)

## 2019-09-27 MED ORDER — GABAPENTIN 300 MG PO CAPS: 300.0000 mg | ORAL_CAPSULE | Freq: Every day | ORAL | Status: DC

## 2019-09-27 MED ORDER — ACETAMINOPHEN 325 MG PO TABS: 650.0000 mg | ORAL_TABLET | Freq: Four times a day (QID) | ORAL | Status: DC | PRN

## 2019-09-27 NOTE — Progress Notes (Signed)
DISCHARGE NOTE HOME Denise [name] to be discharged Home per MD order. Discussed prescriptions and follow up appointments with the patient. Prescriptions given to patient; medication list explained in detail. Patient verbalized understanding.  Skin clean, dry and intact without evidence of skin break down, no evidence of skin tears noted. IV catheter discontinued intact. Site without signs and symptoms of complications. Dressing and pressure applied. Pt denies pain at the site currently. No complaints noted.  Patient free of lines, drains, and wounds.   An After Visit Summary (AVS) was printed and given to the patient. Patient escorted via wheelchair, and discharged home via private auto.  Orville Govern, RN3

## 2019-09-27 NOTE — Discharge Instructions (Signed)
STOP TAKING LITHIUM due to risk of toxic effects. Follow up with Behavioral Health

## 2019-09-27 NOTE — Discharge Summary (Signed)
Physician Discharge Summary  [name] JQB:[id number] DOB: [birth year] DOA: 09/25/2019  PCP: [facility]  Admit date: 09/25/2019 Discharge date: 09/27/2019  Time spent: 35 minutes  Recommendations for Outpatient Follow-up:  1. PCP in 1 week, please check BMP at follow-up 2. Psychiatry Dr. Doyne Keel in 1 week on 9/9 at 11:30 AM   Discharge Diagnoses:  Lithium toxicity   Obesity Active Problems:   Hypertension   Tobacco use   Severe episode of recurrent major depressive disorder, without psychotic features ([place])   Social anxiety disorder   Lithium toxicity   Leukocytosis   Discharge Condition: Stable  Diet recommendation: Heart healthy  Filed Weights   09/25/19 1716 09/26/19 2114  Weight: 135.9 kg 126.4 kg    History of present illness:  Denise [name] PMH complex psychiatric disease,depression/anxiety on lithium chronicallybrought into the emergency room today by family because of confusion, periodic unresponsive episodes, shakes and tremors, ataxia. Work-up revealed negative head CT for acute process. She was found to have a lithium level of 2.64, kidney function was normal   Hospital Course:   Lithium toxicity -Lithium level was elevated 2.64 with symptoms on admission, now improving -Lithium discontinued and hydrated with IV fluids, lithium level down to 1.0 this morning, fortunately did not require hemodialysis for this  -Psychiatry was consulted for medical management recommended discontinuing lithium at discharge and they arrange outpatient follow-up with Dr. Doyne Keel for next Thursday 9/9  -Also recommend follow-up with PCP in 1 week and please check BMP at the time  Hypertension -Stable, continue lisinopril  Anxiety/depression/mood disorder -Psych consulted, discussed with Dr. Dwyane Dee recommended discharge home off lithium and follow-up with Dr. Doyne Keel in 1 week for alternate mood stabilizers -Stable at this time  Type 2  diabetes mellitus -Metformin resumed at discharge  Mild leukocytosis -Reactive, improving  Consultations:  Psych  REnal  Discharge Exam: Vitals:   09/26/19 2114 09/27/19 0643  BP: 127/65 123/78  Pulse: 74 73  Resp: 16 18  Temp: 99 F (37.2 C) 97.6 F (36.4 C)  SpO2: 91% 95%    General: AAOx2,  Cardiovascular: S1S2/RRR Respiratory: CTAB  Discharge Instructions   Discharge Instructions    Diet - low sodium heart healthy   Complete by: As directed    Diet Carb Modified   Complete by: As directed    Increase activity slowly   Complete by: As directed      Allergies as of 09/27/2019      Reactions   Grapefruit Flavor [flavoring Agent] Other (See Comments)   Drug interaction      Medication List    STOP taking these medications   lithium carbonate 300 MG CR tablet Commonly known as: LITHOBID   Naproxen Sodium 220 MG Caps     TAKE these medications   acetaminophen 325 MG tablet Commonly known as: TYLENOL Take 2 tablets (650 mg total) by mouth every 6 (six) hours as needed for mild pain (or Fever >/= 101). Notes to patient: **NEW** For mild pain or fever. Do not take more than 3000mg  per day. STOP taking naproxen to prevent stomach ulcers and kidney issues   amLODipine 10 MG tablet Commonly known as: NORVASC Take 10 mg by mouth daily.   atorvastatin 10 MG tablet Commonly known as: LIPITOR Take 10 mg by mouth at bedtime.   busPIRone 10 MG tablet Commonly known as: BUSPAR Take 2 tablets (20 mg total) by mouth 2 (two) times daily.   gabapentin 300 MG capsule Commonly known  as: NEURONTIN Take 1 capsule (300 mg total) by mouth at bedtime. What changed: when to take this Notes to patient: **DOSE DECREASED** For nerve pain   hydrochlorothiazide 12.5 MG tablet Commonly known as: HYDRODIURIL Take 12.5 mg by mouth daily.   lisinopril 20 MG tablet Commonly known as: ZESTRIL Take 20 mg by mouth 2 (two) times daily.   metFORMIN 500 MG tablet Commonly  known as: GLUCOPHAGE Take 500 mg by mouth 2 (two) times daily.   [name] 0.5 MG Tabs Generic drug: Brexpiprazole Take 1 tablet (0.5 mg total) by mouth daily.   sertraline 100 MG tablet Commonly known as: ZOLOFT Take 2 tablets (200 mg total) by mouth every morning.      Allergies  Allergen Reactions  . Grapefruit Flavor [Flavoring Agent] Other (See Comments)    Drug interaction    Follow-up Information    Vassie Moment, MD Follow up.   Specialty: Family Medicine Contact information: [place] [address] [phone]        [employer] Call.   Why: If you do not hear from home care agency, call to follow up. It could be the agencies [place] [facility] [place], [facility], [facility]) could not accept.  Contact information: [phone]       Charlcie Cradle, MD Follow up on 10/04/2019.   Specialty: Psychiatry Why: at 11:30 Contact information: [place] [id number] [phone]                The results of significant diagnostics from this hospitalization (including imaging, microbiology, ancillary and laboratory) are listed below for reference.    Significant Diagnostic Studies: CT Head Wo Contrast  Result Date: 09/25/2019 CLINICAL DATA:  Delirium. Additional history provided: Confusion for 2 days, frontal headache for 2 days. EXAM: CT HEAD WITHOUT CONTRAST TECHNIQUE: Contiguous axial images were obtained from the base of the skull through the vertex without intravenous contrast. COMPARISON:  Brain MRI 05/19/2017, head CT 05/19/2017. FINDINGS: Brain: Cerebral volume is normal. Redemonstrated chronic left basal ganglia infarct. There is no acute intracranial hemorrhage. No demarcated cortical infarct. No extra-axial fluid collection. No evidence of intracranial mass. No midline shift. Partially empty sella turcica. Vascular: No hyperdense vessel.  Atherosclerotic calcifications Skull: Normal. Negative for fracture or focal  lesion. Sinuses/Orbits: Visualized orbits show no acute finding. Mild ethmoid sinus mucosal thickening. No significant mastoid effusion. IMPRESSION: No CT evidence of acute intracranial abnormality. Redemonstrated chronic left basal ganglia infarct. Partially empty sella turcica. This is very commonly an incidental finding, but can be associated with idiopathic intracranial hypertension. Mild ethmoid sinus mucosal thickening. Electronically Signed   By: Kellie Simmering DO   On: 09/25/2019 13:43   DG Chest Portable 1 View  Result Date: 09/25/2019 CLINICAL DATA:  Cough. EXAM: PORTABLE CHEST 1 VIEW COMPARISON:  February 28, 2019. FINDINGS: The heart size and mediastinal contours are within normal limits. Both lungs are clear. The visualized skeletal structures are unremarkable. IMPRESSION: No active disease. Electronically Signed   By: Marijo Conception M.D.   On: 09/25/2019 11:46    Microbiology: Recent Results (from the past 240 hour(s))  SARS Coronavirus 2 by RT PCR (hospital order, performed in [facility] hospital lab) Nasopharyngeal Nasopharyngeal Swab     Status: None   Collection Time: 09/25/19 11:09 AM   Specimen: Nasopharyngeal Swab  Result Value Ref Range Status   SARS Coronavirus 2 NEGATIVE NEGATIVE Final    Comment: (NOTE) SARS-CoV-2 target nucleic acids are NOT DETECTED.  The  SARS-CoV-2 RNA is generally detectable in upper and lower respiratory specimens during the acute phase of infection. The lowest concentration of SARS-CoV-2 viral copies this assay can detect is 250 copies / mL. A negative result does not preclude SARS-CoV-2 infection and should not be used as the sole basis for treatment or other patient management decisions.  A negative result may occur with improper specimen collection / handling, submission of specimen other than nasopharyngeal swab, presence of viral mutation(s) within the areas targeted by this assay, and inadequate number of viral copies (<250 copies / mL).  A negative result must be combined with clinical observations, patient history, and epidemiological information.  Fact Sheet for Patients:   StrictlyIdeas.no  Fact Sheet for Healthcare Providers: BankingDealers.co.za  This test is not yet approved or  cleared by the [place] FDA and has been authorized for detection and/or diagnosis of SARS-CoV-2 by FDA under an Emergency Use Authorization (EUA).  This EUA will remain in effect (meaning this test can be used) for the duration of the COVID-19 declaration under Section 564(b)(1) of the Act, 21 U.S.C. section 360bbb-3(b)(1), unless the authorization is terminated or revoked sooner.  Performed at [facility], [place] [address], [place], [place] [address]   Blood culture (routine x 2)     Status: Abnormal (Preliminary result)   Collection Time: 09/25/19  5:00 PM   Specimen: BLOOD RIGHT HAND  Result Value Ref Range Status   Specimen Description   Final    BLOOD RIGHT HAND Performed at [facility] [address], [place], [place] [address]    Special Requests   Final    BOTTLES DRAWN AEROBIC AND ANAEROBIC Blood Culture adequate volume Performed at [facility] [address] [place], [place] [address]    Culture  Setup Time   Final    AEROBIC BOTTLE ONLY GRAM POSITIVE COCCI IN CLUSTERS Organism ID to follow CRITICAL RESULT CALLED TO, READ BACK BY AND VERIFIED WITH: G ABBOTT PHARMD 09/27/19 0212 JDW    Culture (A)  Final    STAPHYLOCOCCUS CAPITIS THE SIGNIFICANCE OF ISOLATING THIS ORGANISM FROM A SINGLE VENIPUNCTURE CANNOT BE PREDICTED WITHOUT FURTHER CLINICAL AND CULTURE CORRELATION. SUSCEPTIBILITIES AVAILABLE ONLY ON REQUEST. Performed at [facility], [place] [address] [place], [place] [address]    Report Status PENDING  Incomplete  Blood Culture ID Panel (Reflexed)     Status: Abnormal   Collection Time: 09/25/19   5:00 PM  Result Value Ref Range Status   Enterococcus faecalis NOT DETECTED NOT DETECTED Final   Enterococcus Faecium NOT DETECTED NOT DETECTED Final   Listeria monocytogenes NOT DETECTED NOT DETECTED Final   Staphylococcus species DETECTED (A) NOT DETECTED Final    Comment: CRITICAL RESULT CALLED TO, READ BACK BY AND VERIFIED WITH: G ABBOTT PHARMD 09/27/19 0212 JDW    Staphylococcus aureus (BCID) NOT DETECTED NOT DETECTED Final   Staphylococcus epidermidis NOT DETECTED NOT DETECTED Final   Staphylococcus lugdunensis NOT DETECTED NOT DETECTED Final   Streptococcus species NOT DETECTED NOT DETECTED Final   Streptococcus agalactiae NOT DETECTED NOT DETECTED Final   Streptococcus pneumoniae NOT DETECTED NOT DETECTED Final   Streptococcus pyogenes NOT DETECTED NOT DETECTED Final   A.calcoaceticus-baumannii NOT DETECTED NOT DETECTED Final   Bacteroides fragilis NOT DETECTED NOT DETECTED Final   Enterobacterales NOT DETECTED NOT DETECTED Final   Enterobacter cloacae complex NOT DETECTED NOT DETECTED Final   Escherichia coli NOT DETECTED NOT DETECTED Final   Klebsiella aerogenes  NOT DETECTED NOT DETECTED Final   Klebsiella oxytoca NOT DETECTED NOT DETECTED Final   Klebsiella pneumoniae NOT DETECTED NOT DETECTED Final   Proteus species NOT DETECTED NOT DETECTED Final   Salmonella species NOT DETECTED NOT DETECTED Final   Serratia marcescens NOT DETECTED NOT DETECTED Final   Haemophilus influenzae NOT DETECTED NOT DETECTED Final   Neisseria meningitidis NOT DETECTED NOT DETECTED Final   Pseudomonas aeruginosa NOT DETECTED NOT DETECTED Final   Stenotrophomonas maltophilia NOT DETECTED NOT DETECTED Final   Candida albicans NOT DETECTED NOT DETECTED Final   Candida auris NOT DETECTED NOT DETECTED Final   Candida glabrata NOT DETECTED NOT DETECTED Final   Candida krusei NOT DETECTED NOT DETECTED Final   Candida parapsilosis NOT DETECTED NOT DETECTED Final   Candida tropicalis NOT DETECTED NOT  DETECTED Final   Cryptococcus neoformans/gattii NOT DETECTED NOT DETECTED Final    Comment: Performed at [facility], [place] [address], [place], [place] [address]     Labs: Basic Metabolic Panel: Recent Labs  Lab 09/25/19 1109 09/27/19 0447  NA 137 139  K 4.3 4.0  CL 103 106  CO2 27 23  GLUCOSE 107* 114*  BUN 16 5*  CREATININE 0.84 0.72  CALCIUM 9.9 9.6   Liver Function Tests: Recent Labs  Lab 09/25/19 1109  AST 24  ALT 20  ALKPHOS 110  BILITOT 0.3  PROT 7.0  ALBUMIN 4.2   No results for input(s): LIPASE, AMYLASE in the last 168 hours. No results for input(s): AMMONIA in the last 168 hours. CBC: Recent Labs  Lab 09/25/19 1109 09/27/19 0447  WBC 14.3* 12.8*  HGB 13.1 11.8*  HCT 40.6 37.7  MCV 91.6 89.8  PLT 385 307   Cardiac Enzymes: No results for input(s): CKTOTAL, CKMB, CKMBINDEX, TROPONINI in the last 168 hours. BNP: BNP (last 3 results) No results for input(s): BNP in the last 8760 hours.  ProBNP (last 3 results) No results for input(s): PROBNP in the last 8760 hours.  CBG: Recent Labs  Lab 09/26/19 0733 09/26/19 1132 09/26/19 1645 09/26/19 2115 09/27/19 0655  GLUCAP 114* 122* 143* 113* 130*   Signed:  Domenic Polite MD.  [facility] 09/27/2019, 3:02 PM

## 2019-09-27 NOTE — Progress Notes (Addendum)
PHARMACY - PHYSICIAN COMMUNICATION CRITICAL VALUE ALERT - BLOOD CULTURE IDENTIFICATION (BCID)  Denise [name] is an 49 y.o. female who presented to [facility] on 09/25/2019 with a chief complaint of AMS and Lithium toxicity  Assessment:  Blood culture growing Staphylococcus species.  Likely contaminant  Name of physician (or Provider) Contacted:  Dr. Clearence Ped  Current antibiotics: None  Changes to prescribed antibiotics recommended:   None at this time  Results for orders placed or performed during the hospital encounter of 09/25/19  Blood Culture ID Panel (Reflexed) (Collected: 09/25/2019  5:00 PM)  Result Value Ref Range   Enterococcus faecalis NOT DETECTED NOT DETECTED   Enterococcus Faecium NOT DETECTED NOT DETECTED   Listeria monocytogenes NOT DETECTED NOT DETECTED   Staphylococcus species DETECTED (A) NOT DETECTED   Staphylococcus aureus (BCID) NOT DETECTED NOT DETECTED   Staphylococcus epidermidis NOT DETECTED NOT DETECTED   Staphylococcus lugdunensis NOT DETECTED NOT DETECTED   Streptococcus species NOT DETECTED NOT DETECTED   Streptococcus agalactiae NOT DETECTED NOT DETECTED   Streptococcus pneumoniae NOT DETECTED NOT DETECTED   Streptococcus pyogenes NOT DETECTED NOT DETECTED   A.calcoaceticus-baumannii NOT DETECTED NOT DETECTED   Bacteroides fragilis NOT DETECTED NOT DETECTED   Enterobacterales NOT DETECTED NOT DETECTED   Enterobacter cloacae complex NOT DETECTED NOT DETECTED   Escherichia coli NOT DETECTED NOT DETECTED   Klebsiella aerogenes NOT DETECTED NOT DETECTED   Klebsiella oxytoca NOT DETECTED NOT DETECTED   Klebsiella pneumoniae NOT DETECTED NOT DETECTED   Proteus species NOT DETECTED NOT DETECTED   Salmonella species NOT DETECTED NOT DETECTED   Serratia marcescens NOT DETECTED NOT DETECTED   Haemophilus influenzae NOT DETECTED NOT DETECTED   Neisseria meningitidis NOT DETECTED NOT DETECTED   Pseudomonas aeruginosa NOT DETECTED NOT DETECTED    Stenotrophomonas maltophilia NOT DETECTED NOT DETECTED   Candida albicans NOT DETECTED NOT DETECTED   Candida auris NOT DETECTED NOT DETECTED   Candida glabrata NOT DETECTED NOT DETECTED   Candida krusei NOT DETECTED NOT DETECTED   Candida parapsilosis NOT DETECTED NOT DETECTED   Candida tropicalis NOT DETECTED NOT DETECTED   Cryptococcus neoformans/gattii NOT DETECTED NOT DETECTED    Caryl Pina 09/27/2019  3:24 AM

## 2019-09-27 NOTE — Consult Note (Signed)
Psychiatry update   Have scheduled oupatient appointment with Dr Loretta Plume for patient on thurday 10/04/2019 at 11:30 AM. It is a virtual appointment and patient has been following up with her. It is at [facility] outpatient at [place]. Please do not restart Lithium, she can continue her other medications  Denise [name]

## 2019-09-28 LAB — CULTURE, BLOOD (ROUTINE X 2): Special Requests: ADEQUATE

## 2019-10-04 ENCOUNTER — Other Ambulatory Visit: Payer: Self-pay

## 2019-10-04 ENCOUNTER — Telehealth (HOSPITAL_COMMUNITY): Payer: Medicaid Other | Admitting: Psychiatry

## 2019-10-04 DIAGNOSIS — F411 Generalized anxiety disorder: Secondary | ICD-10-CM

## 2019-10-04 DIAGNOSIS — F4312 Post-traumatic stress disorder, chronic: Secondary | ICD-10-CM

## 2019-10-04 DIAGNOSIS — F333 Major depressive disorder, recurrent, severe with psychotic symptoms: Secondary | ICD-10-CM

## 2019-10-04 NOTE — Progress Notes (Signed)
Virtual Visit via Telephone Note  I connected with Denise [name] on 10/04/19 at 11:30 AM EDT by telephone and verified that I am speaking with the correct person using two identifiers.  Location: Patient: home Provider: office   I discussed the limitations, risks, security and privacy concerns of performing an evaluation and management service by telephone and the availability of in person appointments. I also discussed with the patient that there may be a patient responsible charge related to this service. The patient expressed understanding and agreed to proceed.   History of Present Illness: "I am better thank the Lord". She was hospitalized due to elevated Lithium level. [name] is depressed. She wants to move but they haven't been able to find anything yet. [name] has low energy and low motivation. Her sleep remains poor. She had a sleep study last month and is waiting for her scheduled appointment with the sleep doctor. [name] is mostly sleeping during the day and it could be due to HV. She does not sleep at night. She denies SI/HI. [name]'s anxiety is very high. She has AH during the day and VH on/off at night.    Observations/Objective:  General Appearance: unable to assess  Eye Contact:  unable to assess  Speech:  Clear and Coherent and Slow  Volume:  Normal  Mood:  Anxious and Depressed  Affect:  Congruent  Thought Process:  Coherent and Descriptions of Associations: Intact  Orientation:  Full (Time, Place, and Person)  Thought Content:  Logical  Suicidal Thoughts:  No  Homicidal Thoughts:  No  Memory:  Immediate;   Fair  Judgement:  Fair  Insight:  Present  Psychomotor Activity: unable to assess  Concentration:  Concentration: Fair  Recall:  [employer] of Knowledge:  Fair  Language:  Good  Akathisia:  unable to assess  Handed:  Right  AIMS (if indicated):     Assets:  Communication Skills Desire for Improvement Financial  Resources/Insurance Housing Social Support Talents/Skills Transportation Vocational/Educational  ADL's:  unable to assess  Cognition:  WNL  Sleep:         Assessment and Plan:  MDD- recurrent, severe with psychotic features; GAD; PTSD; Social anxiety disorder; nicotine use disorder; r/o Demential  D/c Lithium due to elevated levels  Has not yet started [name] 0.5mg  pD as of yet due to insurance. She thinks it will be delivered this week.  Bellarae reported good effect in the past.  Continue Zoloft 200mg  po qD Buspar 20mg  po BID  No refills today  Reviewed labs done 09/26/19 and 09/27/19: glu 114, WBC 12.8 and Hb 11.8  Follow Up Instructions: In 2-3 weeks or sooner if needed   I discussed the assessment and treatment plan with the patient. The patient was provided an opportunity to ask questions and all were answered. The patient agreed with the plan and demonstrated an understanding of the instructions.   The patient was advised to call back or seek an in-person evaluation if the symptoms worsen or if the condition fails to improve as anticipated.  I provided 15 minutes of non-face-to-face time during this encounter.   Oletta Darter, MD

## 2019-10-11 NOTE — Progress Notes (Signed)
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  Patient last seen in 2019 for seizure-like spells that were subsequently diagnosed as psychogenic.  Please see previous notes for further history.  She presents today for headache/head pressure.  She was recently hospitalized for lithium toxicity presenting with confusion and headaches.  CT of head personally reviewed showed partially empty sella and known old left basal ganglia infarct but no acute intracranial abnormality.  She reports that the headaches have not improved since treatment.  She gets the headaches daily, about 2 hours a day.  It feels like that her entire top of head (in cap distribution) is being stabbed with hot pokers.  She also endorses a hot pressure sticking her in her left eye.  Also a pressure on top of head.  She reports associated drooling off of the left side of her mouth.  Separate from the headaches, she reports that she has periodic blurred vision.  She also zones out from time to time.  She has increased difficulty speaking and slurs her speech.  She has trouble just functioning and performing physical tasks.   Current NSAIDS:  none Current analgesics:  acetaminophen Current anti-anxiolytic:  buspirone Current Antihypertensive medications:  Amlodipine, lisinopril, HCTZ Current Antidepressant/antipsychotic medications:  Sertraline 200mg , [name] Current Anticonvulsant medications:  Gabapentin 400mg  three times daily.   Past NSAIDS:  Ibuprofen, meloxicam Past analgesics:  tramadol Past antidepressant medications:  Fluoxetine, citalopram, mirtazepine Past anticonvulsant  medications:  topiramate 50mg   Recent labs (from hospitalization (8/31-9/2):  CBC with WBC 12.8, HGB 11.8, HCT 37.7, PLT 307; BMP with Na 139, K 4, Cl 106, CO2 23, glucose 114, BUN 5, Cr 0.72, GFR >60; hepatic panel with t bili 0.3, ALP 110, AST 24, ALT 20.  PAST MEDICAL HISTORY: Past Medical History:  Diagnosis Date  . Abnormal EKG   . AD (atopic dermatitis)   . Alcohol use 04/14/2015  . Anxiety   . Blood transfusion without reported diagnosis   . Depression   . Diabetes mellitus without complication ([place])   . Fibroid uterus 12/18/2014  . Hyperlipemia   . Hypertension   . Memory changes   . Overactive bladder   . Pelvic pain in female 12/18/2014  . Prediabetes   . Right sided weakness   . Seizures ([place])   . Stroke ([place])   . Substance abuse ([place])   . Syncope 02/07/2015  . TIA (transient ischemic attack)   . Tobacco use 04/14/2015    MEDICATIONS: Current Outpatient Medications on File Prior to Visit  Medication Sig Dispense Refill  . acetaminophen (TYLENOL) 325 MG tablet Take 2 tablets (650 mg total) by mouth every 6 (six) hours as needed for mild pain (or Fever >/= 101).    Marland Kitchen amLODipine (NORVASC) 10 MG tablet Take 10 mg by mouth daily.    Marland Kitchen atorvastatin (LIPITOR) 10 MG tablet Take 10 mg by mouth at bedtime.  0  . Brexpiprazole ([name]) 0.5 MG TABS Take 1 tablet (0.5 mg total) by mouth daily. (Patient not taking: Reported on 10/04/2019) 30 tablet 1  . busPIRone (BUSPAR) 10 MG tablet Take 2 tablets (20 mg total) by mouth 2 (two) times daily. 360 tablet 0  . gabapentin (NEURONTIN) 300 MG capsule  Take 1 capsule (300 mg total) by mouth at bedtime. (Patient taking differently: Take 400 mg by mouth at bedtime. )    . hydrochlorothiazide (HYDRODIURIL) 12.5 MG tablet Take 12.5 mg by mouth daily.    Marland Kitchen lisinopril (PRINIVIL,ZESTRIL) 20 MG tablet Take 20 mg by mouth 2 (two) times daily.     . metFORMIN (GLUCOPHAGE) 500 MG tablet Take 500 mg by mouth 2 (two) times daily.  3  . sertraline  (ZOLOFT) 100 MG tablet Take 2 tablets (200 mg total) by mouth every morning. 180 tablet 0   Current Facility-Administered Medications on File Prior to Visit  Medication Dose Route Frequency Provider Last Rate Last Admin  . 0.9 %  sodium chloride infusion  500 mL Intravenous Continuous Doran Stabler, MD        ALLERGIES: Allergies  Allergen Reactions  . Grapefruit Flavor [Flavoring Agent] Other (See Comments)    Drug interaction    FAMILY HISTORY: Family History  Problem Relation Age of Onset  . Stroke Mother   . Anxiety disorder Mother   . Drug abuse Mother   . Alcohol abuse Mother   . Depression Mother   . Heart disease Maternal Grandmother   . Drug abuse Father   . Anxiety disorder Father   . Colon cancer Neg Hx   . Esophageal cancer Neg Hx   . Pancreatic cancer Neg Hx   . Stomach cancer Neg Hx   . Liver disease Neg Hx    SOCIAL HISTORY: Social History   Socioeconomic History  . Marital status: Married    Spouse name: Journalist, newspaper  . Number of children: 3  . Years of education: Not on file  . Highest education level: 6th grade  Occupational History  . Not on file  Tobacco Use  . Smoking status: Former Smoker    Packs/day: 0.50    Types: Cigarettes    Quit date: 10/26/2018    Years since quitting: 0.9  . Smokeless tobacco: Never Used  Vaping Use  . Vaping Use: Never used  Substance and Sexual Activity  . Alcohol use: Not Currently    Alcohol/week: 0.0 standard drinks    Comment: 2 a day  . Drug use: No    Types: "Crack" cocaine    Comment: JANUARY 2017  . Sexual activity: Yes    Birth control/protection: None  Other Topics Concern  . Not on file  Social History Narrative  . Not on file   Social Determinants of Health   Financial Resource Strain:   . Difficulty of Paying Living Expenses: Not on file  Food Insecurity:   . Worried About Charity fundraiser in the Last Year: Not on file  . Ran Out of Food in the Last Year: Not on file    Transportation Needs:   . Lack of Transportation (Medical): Not on file  . Lack of Transportation (Non-Medical): Not on file  Physical Activity:   . Days of Exercise per Week: Not on file  . Minutes of Exercise per Session: Not on file  Stress:   . Feeling of Stress : Not on file  Social Connections:   . Frequency of Communication with Friends and Family: Not on file  . Frequency of Social Gatherings with Friends and Family: Not on file  . Attends Religious Services: Not on file  . Active Member of Clubs or Organizations: Not on file  . Attends Archivist Meetings: Not on file  . Marital Status: Not  on file  Intimate Partner Violence:   . Fear of Current or Ex-Partner: Not on file  . Emotionally Abused: Not on file  . Physically Abused: Not on file  . Sexually Abused: Not on file    PHYSICAL EXAM: Blood pressure 111/67, pulse 93, height 5\' 5"  (1.651 m), weight 281 lb 12.8 oz (127.8 kg), last menstrual period 05/29/2017, SpO2 92 %. General: No acute distress.  Patient appears well-groomed.   Head:  Normocephalic/atraumatic Eyes:  Fundi examined but not visualized Neck: supple, no paraspinal tenderness, full range of motion Heart:  Regular rate and rhythm Lungs:  Clear to auscultation bilaterally Back: No paraspinal tenderness Neurological Exam: alert and oriented to person, place, and time. Attention span and concentration intact, recent and remote memory intact, fund of knowledge intact.  Speech fluent and not dysarthric, language intact.  CN II-XII intact. Bulk and tone normal, muscle strength 5/5 throughout.  Sensation to light touch, temperature and vibration intact.  Deep tendon reflexes 2+ throughout, toes downgoing.  Finger to nose and heel to shin testing intact.  Gait normal, Romberg negative.  IMPRESSION: 1.  Headache with scalp neuralgia 2.  Dysarthria/speech difficulty 3.  Intermittent blurred vision  PLAN: 1.  MRI of brain without contrast 2.  Refer to  ophthalmology for formal eye exam 3.  Do to her psychiatric comorbidities and medications, I have reached out to patient's psychiatrist (Dr. Doyne Keel) to determine best medication for headache prevention.  Nortriptyline may be a good option as it will address treating the headache and neuralgia.  Alternatively, we can try zonisamide or Depakote. 4.  Limit use of pain relievers to no more than 2 days out of week to prevent risk of rebound or medication-overuse headache. 5.  Follow up in 4 to 6 months.  Metta Clines, DO

## 2019-10-15 ENCOUNTER — Ambulatory Visit (INDEPENDENT_AMBULATORY_CARE_PROVIDER_SITE_OTHER): Payer: Medicaid Other | Admitting: Neurology

## 2019-10-15 ENCOUNTER — Other Ambulatory Visit: Payer: Self-pay

## 2019-10-15 VITALS — BP 111/67 | HR 93 | Ht 65.0 in | Wt 281.8 lb

## 2019-10-15 DIAGNOSIS — G35 Multiple sclerosis: Secondary | ICD-10-CM

## 2019-10-15 DIAGNOSIS — R519 Headache, unspecified: Secondary | ICD-10-CM | POA: Diagnosis not present

## 2019-10-15 DIAGNOSIS — H538 Other visual disturbances: Secondary | ICD-10-CM | POA: Diagnosis not present

## 2019-10-15 DIAGNOSIS — R471 Dysarthria and anarthria: Secondary | ICD-10-CM

## 2019-10-15 DIAGNOSIS — M792 Neuralgia and neuritis, unspecified: Secondary | ICD-10-CM

## 2019-10-15 NOTE — Patient Instructions (Addendum)
1.  Will get MRI of brain without contrast. We have sent a referral to [facility] for your MRI and they will call you directly to schedule your appointment. They are located at [place]. If you need to contact them directly please call [phone].  2.  Your PCP needs to refer you to the eye doctor for blurred vision.  Due to your insurance, the referral cannot come from me. 3.  I will reach out to your psychiatrist to determine best medication to treat headache. 4.  Limit use of pain relievers (naproxen) to no more than 2 days out of week to prevent risk of rebound or medication-overuse headache. 5.  Follow up in 4 to 6 months.

## 2019-10-16 ENCOUNTER — Encounter: Payer: Self-pay | Admitting: Neurology

## 2019-10-18 ENCOUNTER — Telehealth (HOSPITAL_COMMUNITY): Payer: Medicaid Other | Admitting: Psychiatry

## 2019-10-18 ENCOUNTER — Other Ambulatory Visit: Payer: Self-pay

## 2019-10-18 DIAGNOSIS — F4312 Post-traumatic stress disorder, chronic: Secondary | ICD-10-CM

## 2019-10-18 DIAGNOSIS — F333 Major depressive disorder, recurrent, severe with psychotic symptoms: Secondary | ICD-10-CM

## 2019-10-18 DIAGNOSIS — F411 Generalized anxiety disorder: Secondary | ICD-10-CM

## 2019-10-18 NOTE — Progress Notes (Unsigned)
Started [name] 2 weeks, tolerating it. Speech - getting hard to get words out for 1 month  Neurologist recommended nortriptyline  Depression ongoing. With [name] she is able get up and has motivation. Still can't force self to do things she wants- wants to organize and put things away. Is cooking and cleaning kitchen. Sleep poor at night. No SI/HI  Significant anxiety  PTSD a little worse but can't say how. Poor memory and easily confused.  Plan start nortriptyline D/c Zoloft   Refill meds Oct 28 @ 9.30am (20 min) Has been set up with home health Has sleep clinic appointment on Oct 27

## 2019-10-19 MED ORDER — NORTRIPTYLINE HCL 50 MG PO CAPS: 50.0000 mg | ORAL_CAPSULE | Freq: Every day | ORAL | 2 refills | Status: DC

## 2019-10-19 MED ORDER — REXULTI 0.5 MG PO TABS: 0.5000 mg | ORAL_TABLET | Freq: Every day | ORAL | 0 refills | Status: DC

## 2019-10-19 MED ORDER — BUSPIRONE HCL 10 MG PO TABS: 20.0000 mg | ORAL_TABLET | Freq: Two times a day (BID) | ORAL | 0 refills | Status: DC

## 2019-11-05 ENCOUNTER — Other Ambulatory Visit: Payer: Self-pay | Admitting: Neurology

## 2019-11-05 ENCOUNTER — Other Ambulatory Visit: Payer: Self-pay

## 2019-11-05 ENCOUNTER — Ambulatory Visit
Admission: RE | Admit: 2019-11-05 | Discharge: 2019-11-05 | Disposition: A | Discharge status: Home / Self Care | Payer: Medicaid Other | Source: Ambulatory Visit | Attending: Neurology | Admitting: Neurology

## 2019-11-05 DIAGNOSIS — H538 Other visual disturbances: Secondary | ICD-10-CM

## 2019-11-05 DIAGNOSIS — M792 Neuralgia and neuritis, unspecified: Secondary | ICD-10-CM

## 2019-11-05 DIAGNOSIS — R519 Headache, unspecified: Secondary | ICD-10-CM

## 2019-11-05 DIAGNOSIS — G35 Multiple sclerosis: Secondary | ICD-10-CM

## 2019-11-05 DIAGNOSIS — G35D Multiple sclerosis, unspecified: Secondary | ICD-10-CM

## 2019-11-21 ENCOUNTER — Encounter: Payer: Self-pay | Admitting: Pulmonary Disease

## 2019-11-21 ENCOUNTER — Ambulatory Visit (INDEPENDENT_AMBULATORY_CARE_PROVIDER_SITE_OTHER): Payer: Medicaid Other | Admitting: Pulmonary Disease

## 2019-11-21 ENCOUNTER — Other Ambulatory Visit: Payer: Self-pay

## 2019-11-21 VITALS — BP 128/76 | HR 95 | Temp 97.6°F | Ht 65.0 in | Wt 297.4 lb

## 2019-11-21 DIAGNOSIS — G4733 Obstructive sleep apnea (adult) (pediatric): Secondary | ICD-10-CM | POA: Diagnosis not present

## 2019-11-21 DIAGNOSIS — E662 Morbid (severe) obesity with alveolar hypoventilation: Secondary | ICD-10-CM

## 2019-11-21 NOTE — Progress Notes (Signed)
[facility]  Chief Complaint  Patient presents with  . Consult    Snoring and fatigued    Constitutional:  BP 128/76 (BP Location: Left Arm, Cuff Size: Normal)   Pulse 95   Temp 97.6 F (36.4 C) (Oral)   Ht 5\' 5"  (1.651 m)   Wt 297 lb 6.4 oz (134.9 kg)   LMP 05/29/2017 (Approximate)   SpO2 98%   BMI 49.49 kg/m   Past Medical History:  Hemorrhagic CVA, Psychogenic seizures, PSTD, Depression, Anxiety, HTN, Syphilis, HLD, Overactive bladder, COVID 16 March 2019  Past Surgical History:  Her  has a past surgical history that includes Cesarean section.  Brief Summary:  Denise [name] is a 49 y.o. female former smoker with obstructive sleep apnea.       Subjective:   She has noticed trouble with her sleep for years.  She snores and stops breathing.  She is tired all the time and gets frequent headaches.  She has is followed for depression by behavioral health.  She had home sleep study in 2019, but doesn't remember hearing results.  She had in lab sleep study in July 2021 and found to have mild sleep apnea and hypoventilation.  She goes to sleep at random times.  She falls asleep trouble falling asleep and staying asleep.  She wakes up several times to use the bathroom.  She gets out of bed at different times in the morning.  She feels tired in the morning.  She does not use anything to help her stay awake.  She denies sleep walking, sleep talking, bruxism, or nightmares.  There is no history of restless legs.  She denies sleep hallucinations, sleep paralysis, or cataplexy.  The Epworth score is 21 out of 24.     Physical Exam:   Appearance - well kempt   ENMT - no sinus tenderness, no oral exudate, no LAN, Mallampati 4 airway, no stridor, endentulous  Respiratory - equal breath sounds bilaterally, no wheezing or rales  CV - s1s2 regular rate and rhythm, no murmurs  Ext - no clubbing, no edema  Skin - no  rashes  Psych - normal mood and affect   Sleep Tests:   HST 10/07/17 >> AHI 25, SpO2 low 69%  PSG 08/23/19 >> AHI 10.6, SpO2 low 80%; CPAP 17 cm H2O with 2 liters O2  Social History:  She  reports that she has been smoking cigarettes. She has been smoking about 0.50 packs per day. She has never used smokeless tobacco. She reports previous alcohol use. She reports that she does not use drugs.  Family History:  Her family history includes Alcohol abuse in her mother; Anxiety disorder in her father and mother; Depression in her mother; Drug abuse in her father and mother; Heart disease in her maternal grandmother; Stroke in her mother.    Discussion:  She has snoring, sleep disruption, apnea, and daytime sleepiness.  She has history of depression and hypertension.  Her BMI is > 35.  Her recent sleep study showed obstructive sleep apnea and hypoventilation improved with CPAP 17 cm H2O and 2 liters oxygen.  Assessment/Plan:   Snoring with excessive daytime sleepiness obstructive sleep apnea. - reviewed her sleep study with her - will arrange for CPAP 17 cm H2O  Obesity hypoventilation syndrome. - discussed how weight can impact sleep and risk for sleep disordered breathing - discussed options to assist with weight loss: combination of diet modification, cardiovascular and strength training exercises -  will arrange for 2 liters oxygen to be used at night with CPAP  Cardiovascular risk. - had an extensive discussion regarding the adverse health consequences related to untreated sleep disordered breathing - specifically discussed the risks for hypertension, coronary artery disease, cardiac dysrhythmias, cerebrovascular disease, and diabetes - lifestyle modification discussed  Safe driving practices. - discussed how sleep disruption can increase risk of accidents, particularly when driving - safe driving practices were discussed  Therapies for obstructive sleep apnea. - if the sleep  study shows significant sleep apnea, then various therapies for treatment were reviewed: CPAP, oral appliance, and surgical interventions  Headaches. - followed by Dr. Metta Clines with [facility]  Depression, Anxiety, PSTD. - followed by Dr. Charlcie Cradle with [facility]  Time Spent Involved in Patient Care on Day of Examination:  33 minutes  Follow up:  Patient Instructions  Will arrange for CPAP and home oxygen set up - you will use CPAP 17 cm water and 2 liters oxygen at night while asleep.  Follow up in 3 months.   Medication List:   Allergies as of 11/21/2019      Reactions   Gadolinium Derivatives Nausea And Vomiting   Nausea and vomiting was followed by epileptic seizure episode that lasted approximately 5 minutes; pt was unable to verbally communicate during that time; she then came to and was able to speak; was evaluated by Rad and RN; kms    Grapefruit Flavor [flavoring Agent] Other (See Comments)   Drug interaction      Medication List       Accurate as of November 21, 2019 12:18 PM. If you have any questions, ask your nurse or doctor.        acetaminophen 325 MG tablet Commonly known as: TYLENOL Take 2 tablets (650 mg total) by mouth every 6 (six) hours as needed for mild pain (or Fever >/= 101).   amLODipine 10 MG tablet Commonly known as: NORVASC Take 10 mg by mouth daily.   atorvastatin 10 MG tablet Commonly known as: LIPITOR Take 10 mg by mouth at bedtime.   busPIRone 10 MG tablet Commonly known as: BUSPAR Take 2 tablets (20 mg total) by mouth 2 (two) times daily.   gabapentin 300 MG capsule Commonly known as: NEURONTIN Take 1 capsule (300 mg total) by mouth at bedtime. What changed: how much to take   hydrochlorothiazide 12.5 MG tablet Commonly known as: HYDRODIURIL Take 12.5 mg by mouth daily.   lisinopril 20 MG tablet Commonly known as: ZESTRIL Take 20 mg by mouth 2 (two) times daily.   metFORMIN 500 MG tablet Commonly  known as: GLUCOPHAGE Take 500 mg by mouth 2 (two) times daily.   nortriptyline 50 MG capsule Commonly known as: Pamelor Take 1 capsule (50 mg total) by mouth at bedtime.   [name] 0.5 MG Tabs Generic drug: Brexpiprazole Take 1 tablet (0.5 mg total) by mouth daily.       Signature:  Chesley Mires, MD [place] Pager - [phone] 11/21/2019, 12:18 PM

## 2019-11-21 NOTE — Patient Instructions (Signed)
Will arrange for CPAP and home oxygen set up - you will use CPAP 17 cm water and 2 liters oxygen at night while asleep.  Follow up in 3 months.

## 2019-11-22 ENCOUNTER — Telehealth (INDEPENDENT_AMBULATORY_CARE_PROVIDER_SITE_OTHER): Payer: Medicaid Other | Admitting: Psychiatry

## 2019-11-22 ENCOUNTER — Telehealth: Payer: Self-pay | Admitting: Pulmonary Disease

## 2019-11-22 DIAGNOSIS — F4312 Post-traumatic stress disorder, chronic: Secondary | ICD-10-CM

## 2019-11-22 DIAGNOSIS — G4701 Insomnia due to medical condition: Secondary | ICD-10-CM

## 2019-11-22 DIAGNOSIS — F411 Generalized anxiety disorder: Secondary | ICD-10-CM

## 2019-11-22 DIAGNOSIS — F333 Major depressive disorder, recurrent, severe with psychotic symptoms: Secondary | ICD-10-CM | POA: Diagnosis not present

## 2019-11-22 MED ORDER — REXULTI 0.5 MG PO TABS: 0.5000 mg | ORAL_TABLET | Freq: Every day | ORAL | 0 refills | Status: DC

## 2019-11-22 MED ORDER — BUSPIRONE HCL 10 MG PO TABS: 20.0000 mg | ORAL_TABLET | Freq: Two times a day (BID) | ORAL | 0 refills | Status: DC

## 2019-11-22 MED ORDER — NORTRIPTYLINE HCL 75 MG PO CAPS: 75.0000 mg | ORAL_CAPSULE | Freq: Every day | ORAL | 0 refills | Status: DC

## 2019-11-22 NOTE — Telephone Encounter (Signed)
Called and spoke to pt. Informed her she would hear from the DME in about 7-10 business days. Pt verbalized understanding and denied any further questions or concerns at this time.

## 2019-11-22 NOTE — Progress Notes (Signed)
Virtual Visit via Telephone Note  I connected with Denise [name] on 11/22/19 at  9:30 AM EDT by telephone and verified that I am speaking with the correct person using two identifiers. Denise [name] does not have any way to do a video call.   Location: Patient: home Provider: office   I discussed the limitations, risks, security and privacy concerns of performing an evaluation and management service by telephone and the availability of in person appointments. I also discussed with the patient that there may be a patient responsible charge related to this service. The patient expressed understanding and agreed to proceed.   History of Present Illness: Nortriptyline helping- less headaches, depression better, no longer in slump. Still with  poor motivation but she is more active than before.  Sleep study dx with OSA and will be getting CPAP soon.  Significantly decreased AVH- now only a couple times a week Anxiety unchanged. Can't relax and teaching thoughts    Observations/Objective:  General Appearance: unable to assess  Eye Contact:  unable to assess  Speech:  Clear and Coherent and Normal Rate  Volume:  Normal  Mood:  Anxious and Depressed  Affect:  Congruent and calmer and brighter than previous visits  Thought Process:  Linear and Descriptions of Associations: Intact  Orientation:  Full (Time, Place, and Person)  Thought Content:  Logical  Suicidal Thoughts:  No  Homicidal Thoughts:  No  Memory:  Immediate;   Poor  Judgement:  Fair  Insight:  Fair  Psychomotor Activity: unable to assess  Concentration:  Concentration: Poor  Recall:  Poor  Fund of Knowledge:  Fair  Language:  Fair  Akathisia:  unable to assess  Handed:  Right  AIMS (if indicated):     Assets:  Desire for Improvement Financial Resources/Insurance Housing Intimacy Social Support  ADL's:  unable to assess  Cognition:  WNL  Sleep:         Assessment and Plan: 1. Severe episode of recurrent  major depressive disorder, with psychotic features ([place]) - Brexpiprazole ([name]) 0.5 MG TABS; Take 1 tablet (0.5 mg total) by mouth daily.  Dispense: 90 tablet; Refill: 0 - busPIRone (BUSPAR) 10 MG tablet; Take 2 tablets (20 mg total) by mouth 2 (two) times daily.  Dispense: 360 tablet; Refill: 0 - INCREASE nortriptyline (PAMELOR) 75 MG capsule; Take 1 capsule (75 mg total) by mouth at bedtime.  Dispense: 90 capsule; Refill: 0  2. Chronic post-traumatic stress disorder (PTSD) - busPIRone (BUSPAR) 10 MG tablet; Take 2 tablets (20 mg total) by mouth 2 (two) times daily.  Dispense: 360 tablet; Refill: 0  3. GAD (generalized anxiety disorder) - busPIRone (BUSPAR) 10 MG tablet; Take 2 tablets (20 mg total) by mouth 2 (two) times daily.  Dispense: 360 tablet; Refill: 0 - nortriptyline (PAMELOR) 75 MG capsule; Take 1 capsule (75 mg total) by mouth at bedtime.  Dispense: 90 capsule; Refill: 0  4. Insomnia due to medical condition Sleep study showed OSA and she is getting a CPAP soon    Follow Up Instructions: In 6-8 weeks or sooner if needed   I discussed the assessment and treatment plan with the patient. The patient was provided an opportunity to ask questions and all were answered. The patient agreed with the plan and demonstrated an understanding of the instructions.   The patient was advised to call back or seek an in-person evaluation if the symptoms worsen or if the condition fails to improve as anticipated.  I provided 15 minutes of  non-face-to-face time during this encounter.   Charlcie Cradle, MD

## 2019-11-26 ENCOUNTER — Telehealth: Payer: Self-pay | Admitting: Pulmonary Disease

## 2019-11-26 NOTE — Telephone Encounter (Signed)
Msg sent to [facility] 

## 2019-11-27 NOTE — Telephone Encounter (Signed)
Pt has heard from [facility] and is going 11/28/19 to get set up Denise [name]

## 2020-01-10 ENCOUNTER — Telehealth (HOSPITAL_COMMUNITY): Payer: Medicaid Other | Admitting: Psychiatry

## 2020-01-10 ENCOUNTER — Other Ambulatory Visit: Payer: Self-pay

## 2020-01-10 DIAGNOSIS — G2589 Other specified extrapyramidal and movement disorders: Secondary | ICD-10-CM

## 2020-01-10 DIAGNOSIS — F4312 Post-traumatic stress disorder, chronic: Secondary | ICD-10-CM

## 2020-01-10 DIAGNOSIS — F333 Major depressive disorder, recurrent, severe with psychotic symptoms: Secondary | ICD-10-CM

## 2020-01-10 DIAGNOSIS — F411 Generalized anxiety disorder: Secondary | ICD-10-CM

## 2020-01-10 MED ORDER — REXULTI 0.5 MG PO TABS: 0.5000 mg | ORAL_TABLET | Freq: Every day | ORAL | 0 refills | Status: DC

## 2020-01-10 MED ORDER — BUSPIRONE HCL 10 MG PO TABS: 20.0000 mg | ORAL_TABLET | Freq: Two times a day (BID) | ORAL | 0 refills | Status: DC

## 2020-01-10 MED ORDER — BENZTROPINE MESYLATE 1 MG PO TABS: 1.0000 mg | ORAL_TABLET | Freq: Two times a day (BID) | ORAL | 2 refills | Status: DC

## 2020-01-10 MED ORDER — NORTRIPTYLINE HCL 50 MG PO CAPS: 100.0000 mg | ORAL_CAPSULE | Freq: Every day | ORAL | 0 refills | Status: DC

## 2020-01-10 NOTE — Progress Notes (Unsigned)
Virtual Visit via Telephone Note  I connected with Denise [name] on 01/10/20 at  9:00 AM EST by telephone and verified that I am speaking with the correct person using two identifiers.  Location: Patient: home Provider: office   I discussed the limitations, risks, security and privacy concerns of performing an evaluation and management service by telephone and the availability of in person appointments. I also discussed with the patient that there may be a patient responsible charge related to this service. The patient expressed understanding and agreed to proceed.   History of Present Illness: "I am way better". She notes that she is suddenly started counting all day. She is unable to resist. Denise [name] is counting her steps, her sips of water, ect. Denise [name] is also experiencing uncontrolled movement in her arms, hands and feet. It is hard to walk and dress herself. It does not interfere with eating. Sleep is not great because her CPAP machine is bothering her. Her depression is stable and the Pamelor is helping a lot. Her anxiety and PTSD remain high. She is having nightmares, intrusive memories, HV and thinking about the worst case scenario all day. She denies SI/HI.    Observations/Objective:  General Appearance: unable to assess  Eye Contact:  unable to assess  Speech:  {Speech:22685}  Volume:  {Volume (PAA):22686}  Mood:  {BHH MOOD:22306}  Affect:  {Affect (PAA):22687}  Thought Process:  {Thought Process (PAA):22688}  Orientation:  {BHH ORIENTATION (PAA):22689}  Thought Content:  {Thought Content:22690}  Suicidal Thoughts:  {ST/HT (PAA):22692}  Homicidal Thoughts:  {ST/HT (PAA):22692}  Memory:  {BHH MEMORY:22881}  Judgement:  {Judgement (PAA):22694}  Insight:  {Insight (PAA):22695}  Psychomotor Activity: unable to assess  Concentration:  {Concentration:21399}  Recall:  {BHH GOOD/FAIR/POOR:22877}  Fund of Knowledge:  {BHH GOOD/FAIR/POOR:22877}  Language:  {BHH  GOOD/FAIR/POOR:22877}  Akathisia:  unable to assess  Handed:  {Handed:22697}  AIMS (if indicated):     Assets:  {Assets (PAA):22698}  ADL's:  unable to assess  Cognition:  {chl bhh cognition:304700322}  Sleep:         Assessment and Plan: 1. Severe episode of recurrent major depressive disorder, with psychotic features ([place]) - Brexpiprazole ([name]) 0.5 MG TABS; Take 1 tablet (0.5 mg total) by mouth daily.  Dispense: 90 tablet; Refill: 0 - busPIRone (BUSPAR) 10 MG tablet; Take 2 tablets (20 mg total) by mouth 2 (two) times daily.  Dispense: 360 tablet; Refill: 0 - increase nortriptyline (PAMELOR) 50 MG capsule; Take 2 capsules (100 mg total) by mouth at bedtime.  Dispense: 180 capsule; Refill: 0  2. Chronic post-traumatic stress disorder (PTSD) - busPIRone (BUSPAR) 10 MG tablet; Take 2 tablets (20 mg total) by mouth 2 (two) times daily.  Dispense: 360 tablet; Refill: 0  3. GAD (generalized anxiety disorder) - busPIRone (BUSPAR) 10 MG tablet; Take 2 tablets (20 mg total) by mouth 2 (two) times daily.  Dispense: 360 tablet; Refill: 0 - increase nortriptyline (PAMELOR) 50 MG capsule; Take 2 capsules (100 mg total) by mouth at bedtime.  Dispense: 180 capsule; Refill: 0  4. Combined pyramidal-extrapyramidal syndrome - start benztropine (COGENTIN) 1 MG tablet; Take 1 tablet (1 mg total) by mouth 2 (two) times daily.  Dispense: 60 tablet; Refill: 2 - she is working with her neurologist regarding EPS and memory problems     Follow Up Instructions: In 6-8 weeks or sooner if  needed   I discussed the assessment and treatment plan with the patient. The patient was provided an opportunity to ask questions and  all were answered. The patient agreed with the plan and demonstrated an understanding of the instructions.   The patient was advised to call back or seek an in-person evaluation if the symptoms worsen or if the condition fails to improve as anticipated.  I provided 12 minutes of  non-face-to-face time during this encounter.   Charlcie Cradle, MD

## 2020-02-20 ENCOUNTER — Encounter: Payer: Self-pay | Admitting: Pulmonary Disease

## 2020-02-20 ENCOUNTER — Other Ambulatory Visit: Payer: Self-pay

## 2020-02-20 ENCOUNTER — Ambulatory Visit (INDEPENDENT_AMBULATORY_CARE_PROVIDER_SITE_OTHER): Payer: Medicaid Other | Admitting: Pulmonary Disease

## 2020-02-20 ENCOUNTER — Ambulatory Visit (INDEPENDENT_AMBULATORY_CARE_PROVIDER_SITE_OTHER): Payer: Medicaid Other

## 2020-02-20 VITALS — BP 122/62 | HR 89 | Temp 98.2°F | Ht 65.0 in | Wt 312.0 lb

## 2020-02-20 DIAGNOSIS — R0600 Dyspnea, unspecified: Secondary | ICD-10-CM

## 2020-02-20 DIAGNOSIS — R059 Cough, unspecified: Secondary | ICD-10-CM

## 2020-02-20 DIAGNOSIS — G4733 Obstructive sleep apnea (adult) (pediatric): Secondary | ICD-10-CM | POA: Diagnosis not present

## 2020-02-20 DIAGNOSIS — R0609 Other forms of dyspnea: Secondary | ICD-10-CM

## 2020-02-20 DIAGNOSIS — E662 Morbid (severe) obesity with alveolar hypoventilation: Secondary | ICD-10-CM

## 2020-02-20 MED ORDER — ALBUTEROL SULFATE HFA 108 (90 BASE) MCG/ACT IN AERS: 2.0000 | INHALATION_SPRAY | Freq: Four times a day (QID) | RESPIRATORY_TRACT | 5 refills | Status: DC | PRN

## 2020-02-20 MED ORDER — FLOVENT HFA 110 MCG/ACT IN AERO: 2.0000 | INHALATION_SPRAY | Freq: Two times a day (BID) | RESPIRATORY_TRACT | 12 refills | Status: DC

## 2020-02-20 NOTE — Patient Instructions (Addendum)
Chest xray today  Will schedule pulmonary function test  Flovent two puffs in the morning and two puffs in the evening, and rinse your mouth after each use  Albuterol two puffs every 6 hours as needed for cough, wheeze, chest congestion, or shortness of breath  Will have [employer] arrange for replacement for your CPAP mask  Follow up in 6 to 8 weeks after you complete your pulmonary function test

## 2020-02-20 NOTE — Progress Notes (Signed)
[facility]  Chief Complaint  Patient presents with  . Follow-up    Shortness of breath with activity, productive cough with thick white phlegm    Constitutional:  BP 122/62 (BP Location: Left Arm, Cuff Size: Normal)   Pulse 89   Temp 98.2 F (36.8 C) (Other (Comment)) Comment (Src): wrist  Ht 5\' 5"  (1.651 m)   Wt (!) 312 lb (141.5 kg)   LMP 05/29/2017 (Approximate)   SpO2 97% Comment: Room air  BMI 51.92 kg/m   Past Medical History:  Hemorrhagic CVA, Psychogenic seizures, PSTD, Depression, Anxiety, HTN, Syphilis, HLD, Overactive bladder, COVID 16 March 2019  Past Surgical History:  Her  has a past surgical history that includes Cesarean section.  Brief Summary:  Denise [name] is a 50 y.o. female former smoker with obstructive sleep apnea. Complaining of dyspnea on exertion and productive cough with white phlegm.       Subjective:   Patient presents today for follow up after starting use of CPAP. She thinks she has been doing better since starting the CPAP but mentions her memory is poor so she is not sure. She is not sure if she is still having headaches. She states she sleeps for 3-4 hours a night but lays in bed for 11-12 hours.   She also endorses shortness of breath, chest tightness and productive cough with white phlegm. She is not sure when this started. She states she had trouble walking in from the parking lot to the office. She is trying to quit smoking, down to 3 cigarettes a day from 2-3 packs. She endorses a runny nose, watery/itchy eyes and congestion. Denies any fevers, sore throat or recent sick contacts.     Physical Exam:   Appearance - well kempt   ENMT - no sinus tenderness, no oral exudate, no LAN, Mallampati 1 airway, no stridor, endentulous  Respiratory - equal breath sounds bilaterally, no wheezing or rales  CV - s1s2 regular rate and rhythm, no murmurs  Ext - no clubbing, no  edema  Skin - no rashes  Psych - normal mood and affect   Sleep Tests:   HST 10/07/17 >> AHI 25, SpO2 low 69%  PSG 08/23/19 >> AHI 10.6, SpO2 low 80%; CPAP 17 cm H2O with 2 liters O2  Social History:  She  reports that she has been smoking cigarettes. She has been smoking about 0.50 packs per day. She has never used smokeless tobacco. She reports previous alcohol use. She reports that she does not use drugs.  Family History:  Her family history includes Alcohol abuse in her mother; Anxiety disorder in her father and mother; Depression in her mother; Drug abuse in her father and mother; Heart disease in her maternal grandmother; Stroke in her mother.    Discussion:  She has snoring, sleep disruption, apnea, and daytime sleepiness.  She has history of depression and hypertension.  Her BMI is > 35.  Her recent sleep study showed obstructive sleep apnea and hypoventilation improved with CPAP 17 cm H2O and 2 liters oxygen.  Assessment/Plan:   Dyspnea on exertion Cough - unable to walk short distances without SOB and chest tightness - productive cough with white phlegm - no s/sxs of infectious etiology - likely due to her long standing history of smoking - will order PFTs and a chest xray - start albuterol inhaler 2 puffs q6h prn - start flovent 2 puffs in the morning and nighttime  Snoring with excessive  daytime sleepiness obstructive sleep apnea. - continue CPAP 17 cm H2O - DME order to replace supplies  Obesity hypoventilation syndrome. - discussed importance of weight loss again - continue 2 liters oxygen to be used at night with CPAP  Follow up:  Patient Instructions  Chest xray today  Will schedule pulmonary function test  Flovent two puffs in the morning and two puffs in the evening, and rinse your mouth after each use  Albuterol two puffs every 6 hours as needed for cough, wheeze, chest congestion, or shortness of breath  Will have [employer] arrange for replacement for  your CPAP mask  Follow up in 6 to 8 weeks after you complete your pulmonary function test   Medication List:   Allergies as of 02/20/2020      Reactions   Gadolinium Derivatives Nausea And Vomiting   Nausea and vomiting was followed by epileptic seizure episode that lasted approximately 5 minutes; pt was unable to verbally communicate during that time; she then came to and was able to speak; was evaluated by Rad and RN; kms    Grapefruit Flavor [flavoring Agent] Other (See Comments)   Drug interaction      Medication List       Accurate as of February 20, 2020 10:56 AM. If you have any questions, ask your nurse or doctor.        acetaminophen 325 MG tablet Commonly known as: TYLENOL Take 2 tablets (650 mg total) by mouth every 6 (six) hours as needed for mild pain (or Fever >/= 101).   albuterol 108 (90 Base) MCG/ACT inhaler Commonly known as: VENTOLIN HFA Inhale 2 puffs into the lungs every 6 (six) hours as needed for wheezing or shortness of breath. Started by: Chesley Mires, MD   amLODipine 10 MG tablet Commonly known as: NORVASC Take 10 mg by mouth daily.   atorvastatin 10 MG tablet Commonly known as: LIPITOR Take 10 mg by mouth at bedtime.   benztropine 1 MG tablet Commonly known as: COGENTIN Take 1 tablet (1 mg total) by mouth 2 (two) times daily.   busPIRone 10 MG tablet Commonly known as: BUSPAR Take 2 tablets (20 mg total) by mouth 2 (two) times daily.   Flovent HFA 110 MCG/ACT inhaler Generic drug: fluticasone Inhale 2 puffs into the lungs in the morning and at bedtime. Started by: Chesley Mires, MD   gabapentin 300 MG capsule Commonly known as: NEURONTIN Take 1 capsule (300 mg total) by mouth at bedtime. What changed: how much to take   hydrochlorothiazide 12.5 MG tablet Commonly known as: HYDRODIURIL Take 12.5 mg by mouth daily.   lisinopril 20 MG tablet Commonly known as: ZESTRIL Take 20 mg by mouth 2 (two) times daily.   metFORMIN 500 MG  tablet Commonly known as: GLUCOPHAGE Take 500 mg by mouth 2 (two) times daily.   nortriptyline 50 MG capsule Commonly known as: Pamelor Take 2 capsules (100 mg total) by mouth at bedtime.   [name] 0.5 MG Tabs Generic drug: Brexpiprazole Take 1 tablet (0.5 mg total) by mouth daily.       Signature:  Denise Ohms, DO PGY-2 IM  [place] Pager - [phone] 02/20/2020, 10:56 AM

## 2020-02-21 ENCOUNTER — Other Ambulatory Visit: Payer: Self-pay | Admitting: Psychiatry

## 2020-02-21 DIAGNOSIS — Z1231 Encounter for screening mammogram for malignant neoplasm of breast: Secondary | ICD-10-CM

## 2020-02-28 NOTE — Progress Notes (Signed)
NEUROLOGY FOLLOW UP OFFICE NOTE  Denise [name] [id number]   Subjective:  Denise [name] is a 50 year old right-handed woman with residual right sided weakness secondary to hemorrhagic stroke, psychogenic non-epileptic seizures, PTSD and severe recurrent depression and generalized anxiety disorder, HTN, and history of treated syphilis follows up for headache.  UPDATE: MRI of brain without contrast on 11/05/2019 personally reviewed showed nonspecific chronic scattered white matter changes with chronic left basal ganglia infarcts and remote pontine microhemorrhage  To help with headaches/scalp neuralgia, she has been started on nortriptyline by her psychiatrist.  Still having burning on her head.    Current NSAIDS:  none Current analgesics:  none Current anti-anxiolytic:  buspirone Current Antihypertensive medications:  Amlodipine, lisinopril, HCTZ Current Antidepressant/antipsychotic medications:  nortriptyline 100mg  at bedtime,  Current Anticonvulsant medications:  Gabapentin 300mg  three times daily.   HISTORY: She developed headaches following hospitalization in late August 2021 for lithium toxicity presenting with confusion and headaches.  CT of head personally reviewed showed partially empty sella and known old left basal ganglia infarct but no acute intracranial abnormality.  She reports that the headaches have not improved since treatment.  She gets the headaches daily, about 2 hours a day.  It feels like that her entire top of head (in cap distribution) is being stabbed with hot pokers.  She also endorses a hot pressure sticking her in her left eye.  Also a pressure on top of head.  She reports associated drooling off of the left side of her mouth.  Separate from the headaches, she reports that she has periodic blurred vision.  She also zones out from time to time.  She has increased difficulty speaking and slurs her speech.  She has trouble just functioning and  performing physical tasks.    Past NSAIDS:  Ibuprofen, meloxicam Past analgesics:  tramadol Past antidepressant medications:  Fluoxetine, citalopram, mirtazepine, Sertraline 200mg , [name]  Past anticonvulsant medications:  topiramate 50mg   PAST MEDICAL HISTORY: Past Medical History:  Diagnosis Date  . Abnormal EKG   . AD (atopic dermatitis)   . Alcohol use 04/14/2015  . Anxiety   . Blood transfusion without reported diagnosis   . Depression   . Diabetes mellitus without complication ([place])   . Fibroid uterus 12/18/2014  . Hyperlipemia   . Hypertension   . Memory changes   . Overactive bladder   . Pelvic pain in female 12/18/2014  . Prediabetes   . Right sided weakness   . Seizures ([place])   . Stroke ([place])   . Substance abuse ([place])   . Syncope 02/07/2015  . TIA (transient ischemic attack)   . Tobacco use 04/14/2015    MEDICATIONS: Current Outpatient Medications on File Prior to Visit  Medication Sig Dispense Refill  . acetaminophen (TYLENOL) 325 MG tablet Take 2 tablets (650 mg total) by mouth every 6 (six) hours as needed for mild pain (or Fever >/= 101).    Marland Kitchen albuterol (VENTOLIN HFA) 108 (90 Base) MCG/ACT inhaler Inhale 2 puffs into the lungs every 6 (six) hours as needed for wheezing or shortness of breath. 8 g 5  . amLODipine (NORVASC) 10 MG tablet Take 10 mg by mouth daily.    Marland Kitchen atorvastatin (LIPITOR) 10 MG tablet Take 10 mg by mouth at bedtime.  0  . benztropine (COGENTIN) 1 MG tablet Take 1 tablet (1 mg total) by mouth 2 (two) times daily. 60 tablet 2  . Brexpiprazole ([name]) 0.5 MG TABS Take 1 tablet (0.5 mg total) by  mouth daily. 90 tablet 0  . busPIRone (BUSPAR) 10 MG tablet Take 2 tablets (20 mg total) by mouth 2 (two) times daily. 360 tablet 0  . fluticasone (FLOVENT HFA) 110 MCG/ACT inhaler Inhale 2 puffs into the lungs in the morning and at bedtime. 1 each 12  . gabapentin (NEURONTIN) 300 MG capsule Take 1 capsule (300 mg total) by mouth at bedtime. (Patient  taking differently: Take 400 mg by mouth at bedtime.)    . hydrochlorothiazide (HYDRODIURIL) 12.5 MG tablet Take 12.5 mg by mouth daily.    Marland Kitchen lisinopril (PRINIVIL,ZESTRIL) 20 MG tablet Take 20 mg by mouth 2 (two) times daily.     . metFORMIN (GLUCOPHAGE) 500 MG tablet Take 500 mg by mouth 2 (two) times daily.  3  . nortriptyline (PAMELOR) 50 MG capsule Take 2 capsules (100 mg total) by mouth at bedtime. 180 capsule 0   Current Facility-Administered Medications on File Prior to Visit  Medication Dose Route Frequency Provider Last Rate Last Admin  . 0.9 %  sodium chloride infusion  500 mL Intravenous Continuous Doran Stabler, MD        ALLERGIES: Allergies  Allergen Reactions  . Gadolinium Derivatives Nausea And Vomiting    Nausea and vomiting was followed by epileptic seizure episode that lasted approximately 5 minutes; pt was unable to verbally communicate during that time; she then came to and was able to speak; was evaluated by Rad and RN; kms   . Grapefruit Flavor [Flavoring Agent] Other (See Comments)    Drug interaction    FAMILY HISTORY: Family History  Problem Relation Age of Onset  . Stroke Mother   . Anxiety disorder Mother   . Drug abuse Mother   . Alcohol abuse Mother   . Depression Mother   . Heart disease Maternal Grandmother   . Drug abuse Father   . Anxiety disorder Father   . Colon cancer Neg Hx   . Esophageal cancer Neg Hx   . Pancreatic cancer Neg Hx   . Stomach cancer Neg Hx   . Liver disease Neg Hx    SOCIAL HISTORY: Social History   Socioeconomic History  . Marital status: Married    Spouse name: Journalist, newspaper  . Number of children: 3  . Years of education: Not on file  . Highest education level: 6th grade  Occupational History  . Not on file  Tobacco Use  . Smoking status: Current Every Day Smoker    Packs/day: 0.50    Types: Cigarettes  . Smokeless tobacco: Never Used  . Tobacco comment: smokes 3 cigarettes per day--02/20/2020  Vaping Use  .  Vaping Use: Never used  Substance and Sexual Activity  . Alcohol use: Not Currently    Alcohol/week: 0.0 standard drinks    Comment: 2 a day  . Drug use: No    Types: "Crack" cocaine    Comment: JANUARY 2017  . Sexual activity: Yes    Birth control/protection: None  Other Topics Concern  . Not on file  Social History Narrative  . Not on file   Social Determinants of Health   Financial Resource Strain: Not on file  Food Insecurity: Not on file  Transportation Needs: Not on file  Physical Activity: Not on file  Stress: Not on file  Social Connections: Not on file  Intimate Partner Violence: Not on file     Objective:  Blood pressure (!) 144/72, pulse (!) 104, height 5\' 5"  (1.651 m), weight (!) 312 lb  3.2 oz (141.6 kg), last menstrual period 05/29/2017, SpO2 90 %. General: No acute distress.  Patient appears well-groomed.   Head:  Normocephalic/atraumatic   Assessment/Plan:   Scalp neuralgia  1.  Increase gabapentin to 600mg  three times daily.  If ineffective, consider changing to Lyrica 2.  Continue nortriptyline 100mg  at bedtime as per psychiatry 3.  Follow up in 6 months.  Metta Clines, DO

## 2020-03-03 ENCOUNTER — Ambulatory Visit: Payer: Medicaid Other | Admitting: Neurology

## 2020-03-03 ENCOUNTER — Other Ambulatory Visit: Payer: Self-pay

## 2020-03-03 ENCOUNTER — Encounter: Payer: Self-pay | Admitting: Neurology

## 2020-03-03 VITALS — BP 144/72 | HR 104 | Ht 65.0 in | Wt 312.2 lb

## 2020-03-03 DIAGNOSIS — M792 Neuralgia and neuritis, unspecified: Secondary | ICD-10-CM

## 2020-03-03 MED ORDER — GABAPENTIN 600 MG PO TABS: 600.0000 mg | ORAL_TABLET | Freq: Three times a day (TID) | ORAL | 5 refills | Status: DC

## 2020-03-03 NOTE — Patient Instructions (Addendum)
1.  Increase gabapentin to 600mg  three times daily 2.  Continue nortriptyline 100mg  at bedtime 3.  Follow up 6 months.

## 2020-03-13 ENCOUNTER — Other Ambulatory Visit: Payer: Self-pay

## 2020-03-13 ENCOUNTER — Telehealth (INDEPENDENT_AMBULATORY_CARE_PROVIDER_SITE_OTHER): Payer: Medicaid Other | Admitting: Psychiatry

## 2020-03-13 DIAGNOSIS — F411 Generalized anxiety disorder: Secondary | ICD-10-CM

## 2020-03-13 DIAGNOSIS — G2589 Other specified extrapyramidal and movement disorders: Secondary | ICD-10-CM

## 2020-03-13 DIAGNOSIS — F4312 Post-traumatic stress disorder, chronic: Secondary | ICD-10-CM | POA: Diagnosis not present

## 2020-03-13 DIAGNOSIS — F333 Major depressive disorder, recurrent, severe with psychotic symptoms: Secondary | ICD-10-CM

## 2020-03-13 MED ORDER — REXULTI 0.5 MG PO TABS: 0.5000 mg | ORAL_TABLET | Freq: Every day | ORAL | 0 refills | Status: DC

## 2020-03-13 MED ORDER — BENZTROPINE MESYLATE 1 MG PO TABS: 1.0000 mg | ORAL_TABLET | Freq: Two times a day (BID) | ORAL | 2 refills | Status: DC

## 2020-03-13 MED ORDER — BUSPIRONE HCL 10 MG PO TABS: 20.0000 mg | ORAL_TABLET | Freq: Two times a day (BID) | ORAL | 0 refills | Status: DC

## 2020-03-13 MED ORDER — NORTRIPTYLINE HCL 50 MG PO CAPS: 100.0000 mg | ORAL_CAPSULE | Freq: Every day | ORAL | 0 refills | Status: DC

## 2020-03-13 NOTE — Progress Notes (Signed)
Virtual Visit via Telephone Note  I connected with Denise [name] on 03/13/20 at  9:30 AM EST by telephone and verified that I am speaking with the correct person using two identifiers. Pt does not have access to internet to do a video call  Location: Patient: home Provider: office   I discussed the limitations, risks, security and privacy concerns of performing an evaluation and management service by telephone and the availability of in person appointments. I also discussed with the patient that there may be a patient responsible charge related to this service. The patient expressed understanding and agreed to proceed.   History of Present Illness: Denise [name] shares that her depression comes and goes. It is much better than before."I am getting better than what I was". She is not sleeping well. She is concerned about her weight gain and wants to start working with the weight loss clinic. Denise [name]'s anxiety is not as intense but mostly centered on her health problems. She has racing thoughts and sometimes gets "lost in my head". Her PTSD episodes are less often. She is concerned about her left side being weaker and having difficulty walking and breathings due to weight gain. She is working with her pulmonary doctor.     Observations/Objective:  General Appearance: unable to assess  Eye Contact:  unable to assess  Speech:  Slow  Volume:  Normal  Mood:  Anxious and Depressed  Affect:  Congruent  Thought Process:  Coherent and Descriptions of Associations: Circumstantial  Orientation:  Full (Time, Place, and Person)  Thought Content:  Rumination  Suicidal Thoughts:  No  Homicidal Thoughts:  No  Memory:  Immediate;   Poor  Judgement:  Fair  Insight:  Fair  Psychomotor Activity: unable to assess  Concentration:  Concentration: Poor  Recall:  Poor  Fund of Knowledge:  Fair  Language:  Fair  Akathisia:  unable to assess  Handed:  Right  AIMS (if indicated):     Assets:  Desire for  Improvement Housing Social Support  ADL's:  unable to assess  Cognition:  WNL  Sleep:         Assessment and Plan: 1. Combined pyramidal-extrapyramidal syndrome - benztropine (COGENTIN) 1 MG tablet; Take 1 tablet (1 mg total) by mouth 2 (two) times daily.  Dispense: 60 tablet; Refill: 2  2. Severe episode of recurrent major depressive disorder, with psychotic features ([place]) - Brexpiprazole ([name]) 0.5 MG TABS; Take 1 tablet (0.5 mg total) by mouth daily.  Dispense: 90 tablet; Refill: 0 - busPIRone (BUSPAR) 10 MG tablet; Take 2 tablets (20 mg total) by mouth 2 (two) times daily.  Dispense: 360 tablet; Refill: 0 - nortriptyline (PAMELOR) 50 MG capsule; Take 2 capsules (100 mg total) by mouth at bedtime.  Dispense: 180 capsule; Refill: 0  3. Chronic post-traumatic stress disorder (PTSD) - busPIRone (BUSPAR) 10 MG tablet; Take 2 tablets (20 mg total) by mouth 2 (two) times daily.  Dispense: 360 tablet; Refill: 0  4. GAD (generalized anxiety disorder) - busPIRone (BUSPAR) 10 MG tablet; Take 2 tablets (20 mg total) by mouth 2 (two) times daily.  Dispense: 360 tablet; Refill: 0 - nortriptyline (PAMELOR) 50 MG capsule; Take 2 capsules (100 mg total) by mouth at bedtime.  Dispense: 180 capsule; Refill: 0   I encouraged Tom to set up an appointment with her PCP in regards to her health issues.Her husband was also present and agreed to call the PCP right after we got off this call.   Follow Up Instructions:  In 2 months or sooner if needed   I discussed the assessment and treatment plan with the patient. The patient was provided an opportunity to ask questions and all were answered. The patient agreed with the plan and demonstrated an understanding of the instructions.   The patient was advised to call back or seek an in-person evaluation if the symptoms worsen or if the condition fails to improve as anticipated.  I provided 15 minutes of non-face-to-face time during this  encounter.   Denise Cradle, MD

## 2020-03-20 ENCOUNTER — Ambulatory Visit (HOSPITAL_COMMUNITY): Payer: Medicaid Other | Admitting: Licensed Clinical Social Worker

## 2020-04-02 ENCOUNTER — Telehealth (HOSPITAL_COMMUNITY): Payer: Self-pay

## 2020-04-02 NOTE — Telephone Encounter (Signed)
 [employer]ACKS PRESCRIPTION COVERAGE APPROVED  [name] 0.5MG  TABLET PA # [id number] EFFECTIVE 04/02/2020 TO 03/28/2021  S/W MARTHA REF # [id number]  CALLED PHARMACY & PT WILL BE NOTIFIED VIA TEXT MESSAGE. TOO EARLY TO FILL NOW BUT MEDICATION WILL BE FILLED ON 4/1 AND PT WILL BE NOTIFIED

## 2020-04-07 ENCOUNTER — Other Ambulatory Visit: Payer: Self-pay

## 2020-04-07 ENCOUNTER — Telehealth (HOSPITAL_COMMUNITY): Payer: Self-pay | Admitting: *Deleted

## 2020-04-07 ENCOUNTER — Ambulatory Visit
Admission: RE | Admit: 2020-04-07 | Discharge: 2020-04-07 | Disposition: A | Discharge status: Home / Self Care | Payer: Medicaid Other | Source: Ambulatory Visit | Attending: Psychiatry | Admitting: Psychiatry

## 2020-04-07 DIAGNOSIS — Z1231 Encounter for screening mammogram for malignant neoplasm of breast: Secondary | ICD-10-CM

## 2020-04-07 NOTE — Telephone Encounter (Signed)
Prior authorization  initiated for [name].

## 2020-04-08 ENCOUNTER — Emergency Department (HOSPITAL_COMMUNITY)
Admission: EM | Admit: 2020-04-08 | Discharge: 2020-04-08 | Disposition: A | Discharge status: Home / Self Care | Payer: Medicaid Other | Attending: Emergency Medicine | Admitting: Emergency Medicine

## 2020-04-08 ENCOUNTER — Other Ambulatory Visit: Payer: Self-pay

## 2020-04-08 ENCOUNTER — Emergency Department (HOSPITAL_COMMUNITY): Payer: Medicaid Other

## 2020-04-08 DIAGNOSIS — M5442 Lumbago with sciatica, left side: Secondary | ICD-10-CM | POA: Diagnosis not present

## 2020-04-08 DIAGNOSIS — Z79899 Other long term (current) drug therapy: Secondary | ICD-10-CM | POA: Diagnosis not present

## 2020-04-08 DIAGNOSIS — R1310 Dysphagia, unspecified: Secondary | ICD-10-CM | POA: Insufficient documentation

## 2020-04-08 DIAGNOSIS — I10 Essential (primary) hypertension: Secondary | ICD-10-CM | POA: Diagnosis not present

## 2020-04-08 DIAGNOSIS — Z7984 Long term (current) use of oral hypoglycemic drugs: Secondary | ICD-10-CM | POA: Diagnosis not present

## 2020-04-08 DIAGNOSIS — W06XXXA Fall from bed, initial encounter: Secondary | ICD-10-CM | POA: Insufficient documentation

## 2020-04-08 DIAGNOSIS — F17210 Nicotine dependence, cigarettes, uncomplicated: Secondary | ICD-10-CM | POA: Insufficient documentation

## 2020-04-08 DIAGNOSIS — M5432 Sciatica, left side: Secondary | ICD-10-CM

## 2020-04-08 DIAGNOSIS — E119 Type 2 diabetes mellitus without complications: Secondary | ICD-10-CM | POA: Diagnosis not present

## 2020-04-08 DIAGNOSIS — M545 Low back pain, unspecified: Secondary | ICD-10-CM | POA: Diagnosis present

## 2020-04-08 LAB — BASIC METABOLIC PANEL
Anion gap: 8 (ref 5–15)
BUN: 7 mg/dL (ref 6–20)
CO2: 27 mmol/L (ref 22–32)
Calcium: 9.6 mg/dL (ref 8.9–10.3)
Chloride: 104 mmol/L (ref 98–111)
Creatinine, Ser: 0.77 mg/dL (ref 0.44–1.00)
GFR, Estimated: >60 mL/min (ref >60)
Glucose, Bld: 103 mg/dL — ABNORMAL HIGH (ref 70–99)
Potassium: 4.3 mmol/L (ref 3.5–5.1)
Sodium: 139 mmol/L (ref 135–145)

## 2020-04-08 LAB — URINALYSIS, ROUTINE W REFLEX MICROSCOPIC
Bilirubin Urine: NEGATIVE
Glucose, UA: NEGATIVE mg/dL
Hgb urine dipstick: NEGATIVE
Ketones, ur: NEGATIVE mg/dL
Leukocytes,Ua: NEGATIVE
Nitrite: NEGATIVE
Protein, ur: NEGATIVE mg/dL
Specific Gravity, Urine: 1.011 (ref 1.005–1.030)
pH: 6 (ref 5.0–8.0)

## 2020-04-08 LAB — CBC WITH DIFFERENTIAL/PLATELET
Abs Immature Granulocytes: 0.07 10*3/uL (ref 0.00–0.07)
Basophils Absolute: 0 10*3/uL (ref 0.0–0.1)
Basophils Relative: 0 %
Eosinophils Absolute: 0.2 10*3/uL (ref 0.0–0.5)
Eosinophils Relative: 2 %
HCT: 39 % (ref 36.0–46.0)
Hemoglobin: 12.3 g/dL (ref 12.0–15.0)
Immature Granulocytes: 1 %
Lymphocytes Relative: 31 %
Lymphs Abs: 2.9 10*3/uL (ref 0.7–4.0)
MCH: 27.1 pg (ref 26.0–34.0)
MCHC: 31.5 g/dL (ref 30.0–36.0)
MCV: 85.9 fL (ref 80.0–100.0)
Monocytes Absolute: 0.7 10*3/uL (ref 0.1–1.0)
Monocytes Relative: 7 %
Neutro Abs: 5.6 10*3/uL (ref 1.7–7.7)
Neutrophils Relative %: 59 %
Platelets: 392 10*3/uL (ref 150–400)
RBC: 4.54 MIL/uL (ref 3.87–5.11)
RDW: 16.6 % — ABNORMAL HIGH (ref 11.5–15.5)
WBC: 9.5 10*3/uL (ref 4.0–10.5)
nRBC: 0 % (ref 0.0–0.2)

## 2020-04-08 LAB — I-STAT BETA HCG BLOOD, ED (MC, WL, AP ONLY): I-stat hCG, quantitative: <5 m[IU]/mL (ref <5)

## 2020-04-08 MED ORDER — FENTANYL CITRATE (PF) 100 MCG/2ML IJ SOLN
50.0000 ug | Freq: Once | INTRAMUSCULAR | Status: AC
  Administered 2020-04-08: 50 ug via INTRAVENOUS
  Filled 2020-04-08: qty 2

## 2020-04-08 MED ORDER — METHOCARBAMOL 500 MG PO TABS: 500.0000 mg | ORAL_TABLET | Freq: Two times a day (BID) | ORAL | 0 refills | Status: DC | PRN

## 2020-04-08 NOTE — ED Notes (Signed)
Patient returned from X-ray 

## 2020-04-08 NOTE — ED Provider Notes (Signed)
[facility] Provider Note   CSN: [id number] Arrival date & time: 04/08/20  5625     History Chief Complaint  Patient presents with  . Back Pain    Denise [name] is a 50 y.o. female.  She is a poor historian due to "memory issues".  Complaining of low back pain radiating to her left leg for at least a week.  Possibly fell out of bed per her husband.  Worse with ambulation.  Has tried nothing for it.  Also complaining that food gets stuck in her throat when she swallows and she has to swallow hard to make it pass.  No fevers chills chest pain shortness of breath abdominal pain vomiting diarrhea or urinary symptoms.  The history is provided by the patient.  Back Pain Location:  Lumbar spine Quality:  Aching Radiates to: left leg. Pain severity:  Severe Pain is:  Unable to specify Onset quality:  Gradual Duration:  1 week Timing:  Constant Progression:  Unchanged Chronicity:  New Context: falling   Relieved by:  Nothing Worsened by:  Ambulation Ineffective treatments:  None tried Associated symptoms: leg pain and tingling   Associated symptoms: no abdominal pain, no bladder incontinence, no bowel incontinence, no chest pain, no dysuria, no fever and no headaches        Past Medical History:  Diagnosis Date  . Abnormal EKG   . AD (atopic dermatitis)   . Alcohol use 04/14/2015  . Anxiety   . Blood transfusion without reported diagnosis   . Depression   . Diabetes mellitus without complication ([place])   . Fibroid uterus 12/18/2014  . Hyperlipemia   . Hypertension   . Memory changes   . Overactive bladder   . Pelvic pain in female 12/18/2014  . Prediabetes   . Right sided weakness   . Seizures ([place])   . Stroke ([place])   . Substance abuse ([place])   . Syncope 02/07/2015  . TIA (transient ischemic attack)   . Tobacco use 04/14/2015    Patient Active Problem List   Diagnosis Date Noted  . Lithium toxicity 09/25/2019  .  Obesity 09/25/2019  . Leukocytosis 09/25/2019  . Snoring 08/23/2019  . Severe episode of recurrent major depressive disorder, without psychotic features ([place]) 11/03/2017  . Social anxiety disorder 11/03/2017  . 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 History of latent syphilis 02/16/2017  . Tobacco use 04/14/2015  . Alcohol use 04/14/2015  . Right sided weakness   . Syncope 02/07/2015  . Pelvic pain in female 12/18/2014  . Fibroid uterus 12/18/2014  . Hypertension 12/18/2014    Past Surgical History:  Procedure Laterality Date  . CESAREAN SECTION     x 2     OB History    Gravida  3   Para  3   Term      Preterm      AB      Living  3     SAB      IAB      Ectopic      Multiple      Live Births              Family History  Problem Relation Age of Onset  . Stroke Mother   . Anxiety disorder Mother   . Drug abuse Mother   . Alcohol abuse Mother   . Depression Mother   . Heart disease Maternal Grandmother   . Drug abuse Father   .  Anxiety disorder Father   . Colon cancer Neg Hx   . Esophageal cancer Neg Hx   . Pancreatic cancer Neg Hx   . Stomach cancer Neg Hx   . Liver disease Neg Hx     Social History   Tobacco Use  . Smoking status: Current Every Day Smoker    Packs/day: 0.50    Types: Cigarettes  . Smokeless tobacco: Never Used  . Tobacco comment: smokes 3 cigarettes per day--02/20/2020  Vaping Use  . Vaping Use: Never used  Substance Use Topics  . Alcohol use: Not Currently    Alcohol/week: 0.0 standard drinks    Comment: 2 a day  . Drug use: No    Types: "Crack" cocaine    Comment: JANUARY 2017    Home Medications Prior to Admission medications   Medication Sig Start Date End Date Taking? Authorizing Provider  acetaminophen (TYLENOL) 325 MG tablet Take 2 tablets (650 mg total) by mouth every 6 (six) hours as needed for mild pain (or Fever >/= 101). Patient not taking: No sig reported 09/27/19   Domenic Polite, MD   albuterol (VENTOLIN HFA) 108 (90 Base) MCG/ACT inhaler Inhale 2 puffs into the lungs every 6 (six) hours as needed for wheezing or shortness of breath. 02/20/20   Chesley Mires, MD  amLODipine (NORVASC) 10 MG tablet Take 10 mg by mouth daily.    [provider]  atorvastatin (LIPITOR) 10 MG tablet Take 10 mg by mouth at bedtime. 06/29/15   [provider]  benztropine (COGENTIN) 1 MG tablet Take 1 tablet (1 mg total) by mouth 2 (two) times daily. 03/13/20 03/13/21  Charlcie Cradle, MD  Brexpiprazole ([name]) 0.5 MG TABS Take 1 tablet (0.5 mg total) by mouth daily. 03/13/20   Charlcie Cradle, MD  busPIRone (BUSPAR) 10 MG tablet Take 2 tablets (20 mg total) by mouth 2 (two) times daily. 03/13/20   Charlcie Cradle, MD  fluticasone (FLOVENT HFA) 110 MCG/ACT inhaler Inhale 2 puffs into the lungs in the morning and at bedtime. 02/20/20   Chesley Mires, MD  gabapentin (NEURONTIN) 600 MG tablet Take 1 tablet (600 mg total) by mouth 3 (three) times daily. 03/03/20   Pieter Partridge, DO  hydrochlorothiazide (HYDRODIURIL) 12.5 MG tablet Take 12.5 mg by mouth daily.    [provider]  lisinopril (PRINIVIL,ZESTRIL) 20 MG tablet Take 20 mg by mouth 2 (two) times daily.     [provider]  metFORMIN (GLUCOPHAGE) 500 MG tablet Take 500 mg by mouth 2 (two) times daily. 05/25/17   [provider]  nortriptyline (PAMELOR) 50 MG capsule Take 2 capsules (100 mg total) by mouth at bedtime. 03/13/20 03/13/21  Charlcie Cradle, MD    Allergies    Gadolinium derivatives and Grapefruit flavor [flavoring agent]  Review of Systems   Review of Systems  Constitutional: Negative for fever.  HENT: Positive for trouble swallowing. Negative for sore throat.   Eyes: Negative for visual disturbance.  Respiratory: Negative for shortness of breath.   Cardiovascular: Negative for chest pain.  Gastrointestinal: Negative for abdominal pain and bowel incontinence.  Genitourinary: Negative for bladder  incontinence and dysuria.  Musculoskeletal: Positive for back pain.  Skin: Negative for rash.  Neurological: Positive for tingling. Negative for headaches.    Physical Exam Updated Vital Signs BP 126/84 (BP Location: Right Arm)   Pulse 85   Temp 97.6 F (36.4 C) (Oral)   Resp 20   Ht 5\' 5"  (1.651 m)   Wt [place] Kitchen)  141.5 kg   LMP 05/29/2017 (Approximate)   BMI 51.92 kg/m   Physical Exam Vitals and nursing note reviewed.  Constitutional:      General: She is not in acute distress.    Appearance: Normal appearance. She is well-developed. She is obese.  HENT:     Head: Normocephalic and atraumatic.  Eyes:     Conjunctiva/sclera: Conjunctivae normal.  Cardiovascular:     Rate and Rhythm: Normal rate and regular rhythm.     Heart sounds: No murmur heard.   Pulmonary:     Effort: Pulmonary effort is normal. No respiratory distress.     Breath sounds: Normal breath sounds.  Abdominal:     Palpations: Abdomen is soft.     Tenderness: There is no abdominal tenderness.  Musculoskeletal:        General: Tenderness present. Normal range of motion.     Cervical back: Neck supple.     Right lower leg: No edema.     Left lower leg: No edema.     Comments: She is diffusely tender to her left knee.  There is no effusion.  No erythema.  No cords appreciated.  Skin:    General: Skin is warm and dry.     Capillary Refill: Capillary refill takes less than 2 seconds.  Neurological:     General: No focal deficit present.     Mental Status: She is alert.     Sensory: No sensory deficit.     Motor: No weakness.     ED Results / Procedures / Treatments   Labs (all labs ordered are listed, but only abnormal results are displayed) Labs Reviewed  BASIC METABOLIC PANEL - Abnormal; Notable for the following components:      Result Value   Glucose, Bld 103 (*)    All other components within normal limits  CBC WITH DIFFERENTIAL/PLATELET - Abnormal; Notable for the following components:   RDW  16.6 (*)    All other components within normal limits  URINALYSIS, ROUTINE W REFLEX MICROSCOPIC  I-STAT BETA HCG BLOOD, ED ([facility], [facility], [facility] ONLY)    EKG EKG Interpretation  Date/Time:  Tuesday April 08 2020 07:34:52 EDT Ventricular Rate:  85 PR Interval:    QRS Duration: 87 QT Interval:  415 QTC Calculation: 494 R Axis:   31 Text Interpretation: Sinus rhythm Low voltage, precordial leads Probable anteroseptal infarct, old No significant change since prior 2/21 Confirmed by Aletta Edouard [id number]) on 04/08/2020 7:36:41 AM   Radiology DG Lumbar Spine Complete  Result Date: 04/08/2020 CLINICAL DATA:  Acute left leg pain. EXAM: LUMBAR SPINE - COMPLETE 4+ VIEW COMPARISON:  None. FINDINGS: There is no evidence of lumbar spine fracture. Alignment is normal. Intervertebral disc spaces are maintained. Calcified uterine fibroid is noted in the pelvis. IMPRESSION: No significant abnormality seen in the lumbar spine. Calcified uterine fibroid is noted. Electronically Signed   By: Marijo Conception M.D.   On: 04/08/2020 08:24   DG Knee Complete 4 Views Left  Result Date: 04/08/2020 CLINICAL DATA:  Acute left leg pain. EXAM: LEFT KNEE - COMPLETE 4+ VIEW COMPARISON:  None. FINDINGS: No evidence of fracture, dislocation, or joint effusion. No evidence of arthropathy or other focal bone abnormality. Soft tissues are unremarkable. IMPRESSION: Negative. Electronically Signed   By: Marijo Conception M.D.   On: 04/08/2020 08:26   MM 3D SCREEN BREAST BILATERAL  Result Date: 04/08/2020 CLINICAL DATA:  Screening. EXAM: DIGITAL SCREENING BILATERAL MAMMOGRAM WITH TOMOSYNTHESIS AND CAD  TECHNIQUE: Bilateral screening digital craniocaudal and mediolateral oblique mammograms were obtained. Bilateral screening digital breast tomosynthesis was performed. The images were evaluated with computer-aided detection. COMPARISON:  Previous exam(s). ACR Breast Density Category b: There are scattered areas of fibroglandular density.  FINDINGS: There are no findings suspicious for malignancy. The images were evaluated with computer-aided detection. IMPRESSION: No mammographic evidence of malignancy. A result letter of this screening mammogram will be mailed directly to the patient. RECOMMENDATION: Screening mammogram in one year. (Code:SM-B-01Y) BI-RADS CATEGORY  1: Negative. Electronically Signed   By: Everlean Alstrom M.D.   On: 04/08/2020 14:29    Procedures Procedures   Medications Ordered in ED Medications  fentaNYL (SUBLIMAZE) injection 50 mcg (50 mcg Intravenous Given 04/08/20 1610)    ED Course  I have reviewed the triage vital signs and the nursing notes.  Pertinent labs & imaging results that were available during my care of the patient were reviewed by me and considered in my medical decision making (see chart for details).  Clinical Course as of 04/08/20 1735  Tue Apr 08, 2020  0826 X-rays of left knee and lumbar spine interpreted by me as no acute findings.  Awaiting radiology reading [MB]  [phone] Reassessed -  she looks more comfortable.  Lab work not showing any significant abnormalities.  Awaiting urinalysis. [MB]  5409 Patient's work-up has been unremarkable.  Urinalysis unremarkable.  She is asking for a knee brace for her left leg.  Asking to eat.  Told her she is being put up for discharge. [MB]    Clinical Course User Index [MB] Hayden Rasmussen, MD   MDM Rules/Calculators/A&P                         This patient complains of low back pain left leg pain including left knee pain; this involves an extensive number of treatment Options and is a complaint that carries with it a high risk of complications and Morbidity. The differential includes fracture, contusion, dislocation, sciatica, musculoskeletal, septic joint, gout  I ordered, reviewed and interpreted labs, which included CBC with normal white count normal hemoglobin, chemistries normal, pregnancy test negative urinalysis negative I ordered  medication IV pain medicine with improvement in her symptoms I ordered imaging studies which included lumbar spine and left knee and I independently    visualized and interpreted imaging which showed minimal degenerative changes Previous records obtained and reviewed in epic, no recent admissions  After the interventions stated above, I reevaluated the patient and found patient to be symptomatically improved.  She is asking for a knee immobilizer to help keep her leg from buckling.  Recommended close follow-up with her PCP.  We will provide her prescription for some muscle relaxant.  Return instructions discussed   Final Clinical Impression(s) / ED Diagnoses Final diagnoses:  Sciatica of left side    Rx / DC Orders ED Discharge Orders         Ordered    methocarbamol (ROBAXIN) 500 MG tablet  2 times daily PRN        04/08/20 1322           Hayden Rasmussen, MD 04/08/20 1737

## 2020-04-08 NOTE — Discharge Instructions (Signed)
You were seen for worsening back pain radiating down your left leg.  You had x-rays of your back and left knee along with blood work and urinalysis that did not show any significant abnormalities.  We are treating you with some muscle relaxants.  Please contact your primary care doctor for close follow-up.  Return to the emergency department if any worsening or concerning symptoms

## 2020-04-08 NOTE — ED Triage Notes (Signed)
[facility] from home. C/O back, left leg, and throat pain/difficulty swallowing X 1 week.

## 2020-04-08 NOTE — Progress Notes (Signed)
Orthopedic Tech Progress Note Patient Details:  Denise [name] [birth year] [id number]  Ortho Devices Type of Ortho Device: Knee Immobilizer Ortho Device/Splint Location: LLE Ortho Device/Splint Interventions: Ordered,Application,Adjustment   Post Interventions Patient Tolerated: Well,Ambulated well Instructions Provided: Poper ambulation with device,Care of device   Janit Pagan 04/08/2020, 2:00 PM

## 2020-04-16 ENCOUNTER — Telehealth (HOSPITAL_COMMUNITY): Payer: Self-pay | Admitting: *Deleted

## 2020-04-16 ENCOUNTER — Telehealth: Payer: Self-pay

## 2020-04-16 NOTE — Telephone Encounter (Signed)
Prior auth obtained for [name].  Dates 04/16/20 thru 04/11/21.

## 2020-04-16 NOTE — Telephone Encounter (Signed)
ATC pt to verify Covid test. LVMTCB.

## 2020-04-17 ENCOUNTER — Encounter: Payer: Self-pay | Admitting: Pulmonary Disease

## 2020-04-17 ENCOUNTER — Ambulatory Visit (INDEPENDENT_AMBULATORY_CARE_PROVIDER_SITE_OTHER): Payer: Medicaid Other | Admitting: Pulmonary Disease

## 2020-04-17 ENCOUNTER — Other Ambulatory Visit: Payer: Self-pay

## 2020-04-17 DIAGNOSIS — R0600 Dyspnea, unspecified: Secondary | ICD-10-CM

## 2020-04-17 DIAGNOSIS — J849 Interstitial pulmonary disease, unspecified: Secondary | ICD-10-CM

## 2020-04-17 DIAGNOSIS — R0609 Other forms of dyspnea: Secondary | ICD-10-CM

## 2020-04-17 DIAGNOSIS — R059 Cough, unspecified: Secondary | ICD-10-CM

## 2020-04-17 LAB — PULMONARY FUNCTION TEST
DL/VA % pred: 88 %
DL/VA: 3.76 ml/min/mmHg/L
DLCO cor % pred: 60 %
DLCO cor: 13.11 ml/min/mmHg
DLCO unc % pred: 58 %
DLCO unc: 12.65 ml/min/mmHg
FEF 25-75 Post: 2.78 L/sec
FEF 25-75 Pre: 2.61 L/sec
FEF2575-%Change-Post: 6 %
FEF2575-%Pred-Post: 111 %
FEF2575-%Pred-Pre: 105 %
FEV1-%Change-Post: 0 %
FEV1-%Pred-Post: 74 %
FEV1-%Pred-Pre: 75 %
FEV1-Post: 1.79 L
FEV1-Pre: 1.8 L
FEV1FVC-%Change-Post: 3 %
FEV1FVC-%Pred-Pre: 107 %
FEV6-%Change-Post: -4 %
FEV6-%Pred-Post: 67 %
FEV6-%Pred-Pre: 70 %
FEV6-Post: 1.96 L
FEV6-Pre: 2.05 L
FEV6FVC-%Pred-Post: 102 %
FEV6FVC-%Pred-Pre: 102 %
FVC-%Change-Post: -3 %
FVC-%Pred-Post: 65 %
FVC-%Pred-Pre: 68 %
FVC-Post: 1.97 L
FVC-Pre: 2.05 L
Post FEV1/FVC ratio: 91 %
Post FEV6/FVC ratio: 100 %
Pre FEV1/FVC ratio: 88 %
Pre FEV6/FVC Ratio: 100 %
RV % pred: 68 %
RV: 1.27 L
TLC % pred: 68 %
TLC: 3.54 L

## 2020-04-17 NOTE — Patient Instructions (Signed)
Finish current scrip for flovent and then stop  Albuterol two puffs as needed for coughing spells  Will arrange for high resolution CT chest and Echocardiogram  [facility] all [facility] at [phone]  Follow up in 6 to 8 weeks

## 2020-04-17 NOTE — Progress Notes (Signed)
Pulmonary, Critical Care, and Sleep Medicine  Chief Complaint  Patient presents with  . Follow-up    Patient did PFT today, patient states she has shortness of breath all the time and it is worse with exertion. Productive cough with white sputum    Constitutional:  BP 120/70 (BP Location: Right Arm, Patient Position: Sitting, Cuff Size: Large)   Pulse 90   Temp 97.6 F (36.4 C) (Temporal)   Ht 5\' 5"  (1.651 m)   Wt (!) 318 lb 12.8 oz (144.6 kg)   LMP 05/29/2017 (Approximate)   SpO2 91%   BMI 53.05 kg/m   Past Medical History:  Hemorrhagic CVA, Psychogenic seizures, PSTD, Depression, Anxiety, HTN, Syphilis, HLD, Overactive bladder, COVID 16 March 2019  Past Surgical History:  She  has a past surgical history that includes Cesarean section.  Brief Summary:  Denise [name] is a 50 y.o. female smoker with dyspnea, cough, and obstructive sleep apnea with obesity hypoventilation syndrome.      Subjective:   She had PFT today.  Showed mild restriction and moderate diffusion defect.  She continues to get winded with activity.  Has intermittent cough.  Down to 3 cigarettes per day.  Has more pain in Lt knee.  Wants help losing weight.  Flovent hasn't made much of a difference.  Physical Exam:   Appearance - well kempt   ENMT - no sinus tenderness, no oral exudate, no LAN, Mallampati 3 airway, no stridor  Respiratory - equal breath sounds bilaterally, no wheezing or rales  CV - s1s2 regular rate and rhythm, no murmurs  Ext - no clubbing, no edema  Skin - no rashes  Psych - normal mood and affect   Pulmonary testing:   PFT 04/17/20 >> FEV1 1.80 (75%), FEV1% 88, TLC 3.54 (68%), DLCO 58%  Chest Imaging:    Sleep Tests:   HST 10/07/17 >> AHI 25, SpO2 low 69%  PSG 08/23/19 >> AHI 10.6, SpO2 low 80%; CPAP 17 cm H2O with 2 liters O2  Cardiac Tests:    Social History:  She  reports that she has been smoking cigarettes. She has been smoking about  0.50 packs per day. She has never used smokeless tobacco. She reports previous alcohol use. She reports that she does not use drugs.  Family History:  Her family history includes Alcohol abuse in her mother; Anxiety disorder in her father and mother; Depression in her mother; Drug abuse in her father and mother; Heart disease in her maternal grandmother; Stroke in her mother.     Assessment/Plan:   Obstructive sleep apnea. - she is compliant with CPAP and reports benefit from therapy - uses [employer] for her DME - continue CPAP 17 cm H2O  Obesity hypoventilation syndrome. - continue 2 liters oxygen at night with CPAP  Dyspnea on exertion with productive cough and history of tobacco abuse.  - no improvement with flovent; she will finish current script and then d/c - prn albuterol - she has diffusion defect and restriction defect; will arrange for high resolution CT chest to further assess for ILD - will arrange for Echo to assess for diastolic function and pulmonary pressures  Tobacco abuse. - encouraged her to continue efforts to gradually quit  Obesity. - provided contact information for [facility]    Time Spent Involved in Patient Care on Day of Examination:  31 minutes  Follow up:  Patient Instructions  Finish current scrip for flovent and then stop  Albuterol  two puffs as needed for coughing spells  Will arrange for high resolution CT chest and Echocardiogram  [facility] all [facility] at [phone]  Follow up in 6 to 8 weeks   Medication List:   Allergies as of 04/17/2020      Reactions   Gadolinium Derivatives Nausea And Vomiting   Nausea and vomiting was followed by epileptic seizure episode that lasted approximately 5 minutes; pt was unable to verbally communicate during that time; she then came to and was able to speak; was evaluated by Rad and RN; kms    Grapefruit Flavor [flavoring Agent] Other (See  Comments)   Drug interaction      Medication List       Accurate as of April 17, 2020 10:47 AM. If you have any questions, ask your nurse or doctor.        acetaminophen 325 MG tablet Commonly known as: TYLENOL Take 2 tablets (650 mg total) by mouth every 6 (six) hours as needed for mild pain (or Fever >/= 101).   albuterol 108 (90 Base) MCG/ACT inhaler Commonly known as: VENTOLIN HFA Inhale 2 puffs into the lungs every 6 (six) hours as needed for wheezing or shortness of breath.   amLODipine 10 MG tablet Commonly known as: NORVASC Take 10 mg by mouth daily.   atorvastatin 10 MG tablet Commonly known as: LIPITOR Take 10 mg by mouth at bedtime.   benztropine 1 MG tablet Commonly known as: COGENTIN Take 1 tablet (1 mg total) by mouth 2 (two) times daily.   busPIRone 10 MG tablet Commonly known as: BUSPAR Take 2 tablets (20 mg total) by mouth 2 (two) times daily.   Flovent HFA 110 MCG/ACT inhaler Generic drug: fluticasone Inhale 2 puffs into the lungs in the morning and at bedtime.   gabapentin 600 MG tablet Commonly known as: Neurontin Take 1 tablet (600 mg total) by mouth 3 (three) times daily.   hydrochlorothiazide 12.5 MG tablet Commonly known as: HYDRODIURIL Take 12.5 mg by mouth daily.   lisinopril 20 MG tablet Commonly known as: ZESTRIL Take 20 mg by mouth 2 (two) times daily.   metFORMIN 500 MG tablet Commonly known as: GLUCOPHAGE Take 500 mg by mouth 2 (two) times daily.   methocarbamol 500 MG tablet Commonly known as: ROBAXIN Take 1 tablet (500 mg total) by mouth 2 (two) times daily as needed for muscle spasms.   nortriptyline 50 MG capsule Commonly known as: Pamelor Take 2 capsules (100 mg total) by mouth at bedtime.   [name] 0.5 MG Tabs Generic drug: Brexpiprazole Take 1 tablet (0.5 mg total) by mouth daily.       Signature:  Chesley Mires, MD [place] Pager - [phone] 04/17/2020, 10:47 AM

## 2020-04-17 NOTE — Progress Notes (Signed)
PFT done today. 

## 2020-04-24 ENCOUNTER — Other Ambulatory Visit: Payer: Self-pay

## 2020-04-24 ENCOUNTER — Telehealth (INDEPENDENT_AMBULATORY_CARE_PROVIDER_SITE_OTHER): Payer: Medicaid Other | Admitting: Psychiatry

## 2020-04-24 DIAGNOSIS — F4312 Post-traumatic stress disorder, chronic: Secondary | ICD-10-CM | POA: Diagnosis not present

## 2020-04-24 DIAGNOSIS — F411 Generalized anxiety disorder: Secondary | ICD-10-CM

## 2020-04-24 DIAGNOSIS — F333 Major depressive disorder, recurrent, severe with psychotic symptoms: Secondary | ICD-10-CM | POA: Diagnosis not present

## 2020-04-24 DIAGNOSIS — G2589 Other specified extrapyramidal and movement disorders: Secondary | ICD-10-CM | POA: Diagnosis not present

## 2020-04-24 MED ORDER — NORTRIPTYLINE HCL 75 MG PO CAPS: 150.0000 mg | ORAL_CAPSULE | Freq: Every day | ORAL | 0 refills | Status: DC

## 2020-04-24 MED ORDER — REXULTI 0.5 MG PO TABS
0.5000 mg | ORAL_TABLET | Freq: Every day | ORAL | 0 refills | Status: DC
Start: 2020-04-24 — End: 2020-06-05

## 2020-04-24 MED ORDER — BENZTROPINE MESYLATE 1 MG PO TABS: 1.0000 mg | ORAL_TABLET | Freq: Two times a day (BID) | ORAL | 0 refills | Status: DC

## 2020-04-24 MED ORDER — BUSPIRONE HCL 10 MG PO TABS: 20.0000 mg | ORAL_TABLET | Freq: Two times a day (BID) | ORAL | 0 refills | Status: DC

## 2020-04-24 NOTE — Progress Notes (Signed)
Virtual Visit via Telephone Note  I connected with Denise [name] on 04/24/20 at  9:30 AM EDT by telephone and verified that I am speaking with the correct person using two identifiers. She does not internet or smart phone access.   Location: Patient: home Provider: office   I discussed the limitations, risks, security and privacy concerns of performing an evaluation and management service by telephone and the availability of in person appointments. I also discussed with the patient that there may be a patient responsible charge related to this service. The patient expressed understanding and agreed to proceed.   History of Present Illness: "I'm not doing good". She is in a lot of pain in her knees. Denise [name] went to the ED and they referred her to her PCP. She states her living conditions are not good. She is tired of it. Denise [name] is looking for a referral to an ACT team. Her depression and anxiety are significantly worse. She states the level is 10/10. She cries every day. Denise [name]'s stress tolerance is very low. She is endorsing anhedonia, isolation and negative self thoughts. Her sleep is poor. She has trouble falling and staying asleep even with her CPAP machine. Her appetite has increased and she is stress eating. She is now 310 lbs. She denies SI/HI. Denise [name] does not have transporation.    Observations/Objective:  General Appearance: unable to assess  Eye Contact:  unable to assess  Speech:  Clear and Coherent and variable ratae  Volume:  Normal  Mood:  Anxious and Depressed  Affect:  Congruent  Thought Process:  Coherent and Descriptions of Associations: Circumstantial  Orientation:  Full (Time, Place, and Person)  Thought Content:  Rumination  Suicidal Thoughts:  No  Homicidal Thoughts:  No  Memory:  Immediate;   Good  Judgement:  Fair  Insight:  Present  Psychomotor Activity: unable to assess  Concentration:  Concentration: Good  Recall:  Good  Fund of Knowledge:  Good   Language:  Good  Akathisia:  unable to assess  Handed:  Right  AIMS (if indicated):     Assets:  Desire for Improvement Financial Resources/Insurance Housing Intimacy Vocational/Educational  ADL's:  unable to assess  Cognition:  WNL  Sleep:         Assessment and Plan: Depression screen [facility] 2/9 04/24/2020 07/15/2016  Decreased Interest 3 3  Down, Depressed, Hopeless 3 3  PHQ - 2 Score 6 6  Altered sleeping 3 3  Tired, decreased energy 3 3  Change in appetite 3 1  Feeling bad or failure about yourself  3 1  Trouble concentrating 3 3  Moving slowly or fidgety/restless 0 2  Suicidal thoughts 0 0  PHQ-9 Score 21 19  Difficult doing work/chores Extremely dIfficult -    Flowsheet Row Video Visit from 04/24/2020 in [facility] from 04/08/2020 in [place] Error: Q3, 4, or 5 should not be populated when Q2 is No No Risk     1. Severe episode of recurrent major depressive disorder, with psychotic features ([place]) - Brexpiprazole ([name]) 0.5 MG TABS; Take 1 tablet (0.5 mg total) by mouth daily.  Dispense: 90 tablet; Refill: 0 - busPIRone (BUSPAR) 10 MG tablet; Take 2 tablets (20 mg total) by mouth 2 (two) times daily.  Dispense: 360 tablet; Refill: 0 - nortriptyline (PAMELOR) 75 MG capsule; Take 2 capsules (150 mg total) by mouth at bedtime.  Dispense: 180 capsule; Refill: 0  2.  GAD (generalized anxiety disorder) - busPIRone (BUSPAR) 10 MG tablet; Take 2 tablets (20 mg total) by mouth 2 (two) times daily.  Dispense: 360 tablet; Refill: 0 - nortriptyline (PAMELOR) 75 MG capsule; Take 2 capsules (150 mg total) by mouth at bedtime.  Dispense: 180 capsule; Refill: 0  3. Combined pyramidal-extrapyramidal syndrome - benztropine (COGENTIN) 1 MG tablet; Take 1 tablet (1 mg total) by mouth 2 (two) times daily.  Dispense: 180 tablet; Refill: 0  4. Chronic post-traumatic stress disorder  (PTSD) - busPIRone (BUSPAR) 10 MG tablet; Take 2 tablets (20 mg total) by mouth 2 (two) times daily.  Dispense: 360 tablet; Refill: 0  -increase Nortriptyline  - referred for therapy  - pt asked me to call an ACT team for housing [phone]  Follow Up Instructions: In 4-6 weeks or sooner if needed   I discussed the assessment and treatment plan with the patient. The patient was provided an opportunity to ask questions and all were answered. The patient agreed with the plan and demonstrated an understanding of the instructions.   The patient was advised to call back or seek an in-person evaluation if the symptoms worsen or if the condition fails to improve as anticipated.  I provided 25 minutes of non-face-to-face time during this encounter.   Charlcie Cradle, MD

## 2020-04-25 ENCOUNTER — Ambulatory Visit (HOSPITAL_COMMUNITY): Payer: Medicaid Other

## 2020-04-28 ENCOUNTER — Telehealth: Payer: Self-pay | Admitting: Neurology

## 2020-04-28 NOTE — Telephone Encounter (Signed)
This needs to be addressed by her PCP

## 2020-04-28 NOTE — Telephone Encounter (Signed)
Pt called and informed to call her PCP to get the referral to the Act team

## 2020-04-28 NOTE — Telephone Encounter (Signed)
Patient called and said she needs a referral to the "ACT team" for help.  She said to call: [phone]

## 2020-04-28 NOTE — Telephone Encounter (Signed)
Pt stated that she is from [facility] and that her husband has been keeping a roof over her head but its getting to small her kids are in [facility] and she needs them here with her and she is asking for a referral to the ACT team to help with getting a bigger house. She stated that she just can think and doesn't know what to do. Asking for help

## 2020-05-07 ENCOUNTER — Ambulatory Visit (INDEPENDENT_AMBULATORY_CARE_PROVIDER_SITE_OTHER): Payer: Medicaid Other | Admitting: Bariatrics

## 2020-05-21 ENCOUNTER — Ambulatory Visit (INDEPENDENT_AMBULATORY_CARE_PROVIDER_SITE_OTHER): Payer: Medicaid Other | Admitting: Bariatrics

## 2020-05-26 ENCOUNTER — Encounter: Payer: Self-pay | Admitting: Podiatry

## 2020-05-26 ENCOUNTER — Ambulatory Visit: Payer: Medicaid Other | Admitting: Podiatry

## 2020-05-26 ENCOUNTER — Other Ambulatory Visit: Payer: Self-pay

## 2020-05-26 DIAGNOSIS — M79674 Pain in right toe(s): Secondary | ICD-10-CM | POA: Diagnosis not present

## 2020-05-26 DIAGNOSIS — B351 Tinea unguium: Secondary | ICD-10-CM | POA: Insufficient documentation

## 2020-05-26 DIAGNOSIS — M79675 Pain in left toe(s): Secondary | ICD-10-CM

## 2020-05-26 HISTORY — DX: Tinea unguium: B35.1

## 2020-05-26 NOTE — Progress Notes (Signed)
This patient presents to the office with chief complaint of long thick nails     This patient says there are long thick painful nails.  These nails are painful walking and wearing shoes.   Patient is unable to  self treat her own nails . This patient presents  to the office today for treatment of the  long nails.  General Appearance  Alert, conversant and in no acute stress.  Vascular  Dorsalis pedis and posterior tibial  pulses are weakly  palpable  bilaterally.  Capillary return is within normal limits  bilaterally. Temperature is within normal limits  bilaterally.  Neurologic  Senn-Weinstein monofilament wire test within normal limits/diminished  B/L.  Muscle power within normal limits bilaterally.  Nails Thick disfigured discolored nails with subungual debris  from hallux to fifth toes bilaterally. No evidence of bacterial infection or drainage bilaterally.  Orthopedic  No limitations of motion of motion feet .  No crepitus or effusions noted.  No bony pathology or digital deformities noted.  Skin  normotropic skin with no porokeratosis noted bilaterally.  No signs of infections or ulcers noted.     Onychomycosis    IE  Debride nails x 10.    RTC 4 months.   Gardiner Barefoot DPM

## 2020-05-30 ENCOUNTER — Ambulatory Visit: Payer: Medicaid Other | Admitting: Pulmonary Disease

## 2020-06-05 ENCOUNTER — Other Ambulatory Visit: Payer: Self-pay

## 2020-06-05 ENCOUNTER — Telehealth (INDEPENDENT_AMBULATORY_CARE_PROVIDER_SITE_OTHER): Payer: Medicaid Other | Admitting: Psychiatry

## 2020-06-05 DIAGNOSIS — F4312 Post-traumatic stress disorder, chronic: Secondary | ICD-10-CM

## 2020-06-05 DIAGNOSIS — G2589 Other specified extrapyramidal and movement disorders: Secondary | ICD-10-CM | POA: Diagnosis not present

## 2020-06-05 DIAGNOSIS — F333 Major depressive disorder, recurrent, severe with psychotic symptoms: Secondary | ICD-10-CM | POA: Diagnosis not present

## 2020-06-05 DIAGNOSIS — F411 Generalized anxiety disorder: Secondary | ICD-10-CM

## 2020-06-05 MED ORDER — REXULTI 0.5 MG PO TABS: 0.5000 mg | ORAL_TABLET | Freq: Every day | ORAL | 0 refills | Status: DC

## 2020-06-05 MED ORDER — BENZTROPINE MESYLATE 1 MG PO TABS: 1.0000 mg | ORAL_TABLET | Freq: Two times a day (BID) | ORAL | 0 refills | Status: DC

## 2020-06-05 MED ORDER — BUSPIRONE HCL 10 MG PO TABS: 20.0000 mg | ORAL_TABLET | Freq: Two times a day (BID) | ORAL | 0 refills | Status: DC

## 2020-06-05 MED ORDER — NORTRIPTYLINE HCL 75 MG PO CAPS
150.0000 mg | ORAL_CAPSULE | Freq: Every day | ORAL | 0 refills | Status: DC
Start: 2020-06-05 — End: 2020-08-28

## 2020-06-05 NOTE — Progress Notes (Signed)
Virtual Visit via Telephone Note  I connected with [name] on 06/05/20 at 11:00 AM EDT by telephone and verified that I am speaking with the correct person using two identifiers. She does not have access to Internet or a smart phone  Location: Patient: home Provider: office   I discussed the limitations, risks, security and privacy concerns of performing an evaluation and management service by telephone and the availability of in person appointments. I also discussed with the patient that there may be a patient responsible charge related to this service. The patient expressed understanding and agreed to proceed.   History of Present Illness: Denise [name] shares that she is doing well. The medications have helped her mood improve. She has sometimes has thoughts that she can not voice. She has been having headaches that hurts behind her eye. It comes and goes thru the day and night. I advised her to contact her PCP. Over the last 2 weeks she has been depressed about half the days. She is endorsing anhedonia, low motivation and low energy. Denise [name] is endorsing broken sleep. She is getting about 8 hrs total. Her appetite is good. She has gained weight and is about 300 lbs due to medications. She denies SI/HI. Denise [name] shares that she sometimes feels as though she is not loved even though she knows it is not true.  She is always experiencing racing thoughts and has inability to relax. She has noticed a small improvement in her PTSD symptoms but is unable to explain how. She is able to say that she is able to let go out the intrusive memories a little faster than before.  She has an appointment with her cardiologist tomorrow.    Observations/Objective:  General Appearance: unable to assess  Eye Contact:  unable to assess  Speech:  Clear and Coherent and Normal Rate  Volume:  Normal  Mood:  Anxious and Depressed  Affect:  Full Range and brighter and calmer than previous visits  Thought  Process:  Coherent and Descriptions of Associations: Intact  Orientation:  Full (Time, Place, and Person)  Thought Content:  Logical  Suicidal Thoughts:  No  Homicidal Thoughts:  No  Memory:  Immediate;   Fair  Judgement:  Good  Insight:  Good  Psychomotor Activity: unable to assess  Concentration:  Concentration: Good  Recall:  Good  Fund of Knowledge:  Good  Language:  Good  Akathisia:  unable to assess  Handed:  Right  AIMS (if indicated):     Assets:  Communication Skills Desire for Improvement Financial Resources/Insurance Housing Intimacy Social Support Talents/Skills Vocational/Educational  ADL's:  unable to assess  Cognition:  WNL  Sleep:         Assessment and Plan: Depression screen [name] 2/9 06/05/2020 04/24/2020 07/15/2016  Decreased Interest 2 3 3   Down, Depressed, Hopeless 2 3 3   PHQ - 2 Score 4 6 6   Altered sleeping 3 3 3   Tired, decreased energy 3 3 3   Change in appetite 0 3 1  Feeling bad or failure about yourself  2 3 1   Trouble concentrating 3 3 3   Moving slowly or fidgety/restless 0 0 2  Suicidal thoughts 0 0 0  PHQ-9 Score 15 21 19   Difficult doing work/chores Very difficult Extremely dIfficult -    Flowsheet Row Video Visit from 06/05/2020 in [facility] Video Visit from 04/24/2020 in [facility] from 04/08/2020 in [place]  Error: Q3, 4, or 5 should not be populated when Q2 is No Error: Q3, 4, or 5 should not be populated when Q2 is No No Risk     1. Combined pyramidal-extrapyramidal syndrome - benztropine (COGENTIN) 1 MG tablet; Take 1 tablet (1 mg total) by mouth 2 (two) times daily.  Dispense: 180 tablet; Refill: 0  2. Severe episode of recurrent major depressive disorder, with psychotic features ([place]) - Brexpiprazole ([name]) 0.5 MG TABS; Take 1 tablet (0.5 mg total) by mouth daily.  Dispense: 90  tablet; Refill: 0 - busPIRone (BUSPAR) 10 MG tablet; Take 2 tablets (20 mg total) by mouth 2 (two) times daily.  Dispense: 360 tablet; Refill: 0 - nortriptyline (PAMELOR) 75 MG capsule; Take 2 capsules (150 mg total) by mouth at bedtime.  Dispense: 180 capsule; Refill: 0  3. Chronic post-traumatic stress disorder (PTSD) - busPIRone (BUSPAR) 10 MG tablet; Take 2 tablets (20 mg total) by mouth 2 (two) times daily.  Dispense: 360 tablet; Refill: 0  4. GAD (generalized anxiety disorder) - busPIRone (BUSPAR) 10 MG tablet; Take 2 tablets (20 mg total) by mouth 2 (two) times daily.  Dispense: 360 tablet; Refill: 0 - nortriptyline (PAMELOR) 75 MG capsule; Take 2 capsules (150 mg total) by mouth at bedtime.  Dispense: 180 capsule; Refill: 0    Follow Up Instructions: In 2-3 months or sooner if needed   I discussed the assessment and treatment plan with the patient. The patient was provided an opportunity to ask questions and all were answered. The patient agreed with the plan and demonstrated an understanding of the instructions.   The patient was advised to call back or seek an in-person evaluation if the symptoms worsen or if the condition fails to improve as anticipated.  I provided 15 minutes of non-face-to-face time during this encounter.   Charlcie Cradle, MD

## 2020-06-06 ENCOUNTER — Other Ambulatory Visit (HOSPITAL_COMMUNITY): Payer: Medicaid Other

## 2020-06-06 ENCOUNTER — Other Ambulatory Visit: Payer: Self-pay

## 2020-06-06 ENCOUNTER — Ambulatory Visit (HOSPITAL_COMMUNITY): Payer: Medicaid Other | Attending: Cardiovascular Disease

## 2020-06-06 DIAGNOSIS — R0600 Dyspnea, unspecified: Secondary | ICD-10-CM | POA: Diagnosis not present

## 2020-06-06 DIAGNOSIS — J849 Interstitial pulmonary disease, unspecified: Secondary | ICD-10-CM | POA: Diagnosis not present

## 2020-06-06 LAB — ECHOCARDIOGRAM COMPLETE
Area-P 1/2: 2.83 cm2
P 1/2 time: 332 msec
S' Lateral: 2.8 cm

## 2020-06-06 MED ORDER — PERFLUTREN LIPID MICROSPHERE
1.0000 mL | INTRAVENOUS | Status: AC | PRN
  Administered 2020-06-06: 2 mL via INTRAVENOUS

## 2020-06-09 ENCOUNTER — Encounter: Payer: Self-pay | Admitting: *Deleted

## 2020-06-11 NOTE — Progress Notes (Signed)
Cardiology Office Note:    Date:  06/12/2020   ID:  Denise [name], DOB 1970/08/20, MRN [id number]  PCP:  [facility], [facility]   [facility] Providers Cardiologist:  None     CC: DOE Consulted for the evaluation of SOB at the [employer], [facility]  History of Present Illness:    Denise [name] is a 50 y.o. female with a hx of Morbid Obesity, HTN with DM, Tobacco Abuse and seizures,  OSA, on CPAP and OHS, who presents for evaluation 06/12/20.  Patient notes that she is feeling great now.  Has had off and on chest pain and  chest tightness in the strernum. Worsens mostly at night some days thatn others with no clear food, alcohol (no alcohol) or smoking triggers.  Patient exertion notable for walking around the house with DOE.  Going through a lot of stress with her husband who is having alcohol liver issues and doesn't want to get treatment.  No shortness of breath at rest.  Slight orthopnea.  Notes bendopnea.  Weight gain, no leg swelling, but abdominal swelling.  Near syncope with exertion. Notes  no palpitations or funny heart beats.     No history of pre-eclampsia, gestational hypertension or gestational DM.  Ambulatory BP not done.   Past Medical History:  Diagnosis Date  . Abnormal EKG   . AD (atopic dermatitis)   . Alcohol use 04/14/2015  . Anxiety   . Blood transfusion without reported diagnosis   . Depression   . Diabetes mellitus without complication ([place])   . Fibroid uterus 12/18/2014  . Hyperlipemia   . Hypertension   . Memory changes   . Overactive bladder   . Pelvic pain in female 12/18/2014  . Prediabetes   . Right sided weakness   . Seizures ([place])   . Stroke ([place])   . Substance abuse ([place])   . Syncope 02/07/2015  . TIA (transient ischemic attack)   . Tobacco use 04/14/2015    Past Surgical History:  Procedure Laterality Date  . CESAREAN SECTION     x 2    Current  Medications: Current Meds  Medication Sig  . acetaminophen (TYLENOL) 325 MG tablet Take 2 tablets (650 mg total) by mouth every 6 (six) hours as needed for mild pain (or Fever >/= 101).  . ACETAMINOPHEN EXTRA STRENGTH 500 MG tablet Take 1,000 mg by mouth every 8 (eight) hours as needed.  Marland Kitchen albuterol (VENTOLIN HFA) 108 (90 Base) MCG/ACT inhaler Inhale 2 puffs into the lungs every 6 (six) hours as needed for wheezing or shortness of breath.  Marland Kitchen amLODipine (NORVASC) 10 MG tablet Take 10 mg by mouth daily.  Marland Kitchen atorvastatin (LIPITOR) 10 MG tablet Take 10 mg by mouth at bedtime.  . benztropine (COGENTIN) 1 MG tablet Take 1 tablet (1 mg total) by mouth 2 (two) times daily.  . Brexpiprazole ([name]) 0.5 MG TABS Take 1 tablet (0.5 mg total) by mouth daily.  . busPIRone (BUSPAR) 10 MG tablet Take 2 tablets (20 mg total) by mouth 2 (two) times daily.  . fluticasone (FLOVENT HFA) 110 MCG/ACT inhaler Inhale 2 puffs into the lungs in the morning and at bedtime.  . gabapentin (NEURONTIN) 300 MG capsule Take 1 capsule by mouth 3 (three) times daily.  Marland Kitchen gabapentin (NEURONTIN) 600 MG tablet Take 1 tablet (600 mg total) by mouth 3 (three) times daily.  . hydrochlorothiazide (HYDRODIURIL) 12.5 MG tablet Take 12.5 mg by mouth daily.  Marland Kitchen  lisinopril (PRINIVIL,ZESTRIL) 20 MG tablet Take 20 mg by mouth 2 (two) times daily.   . meloxicam (MOBIC) 15 MG tablet Take 1 tablet by mouth as needed.  . metFORMIN (GLUCOPHAGE) 500 MG tablet Take 500 mg by mouth 2 (two) times daily.  . methocarbamol (ROBAXIN) 750 MG tablet Take 750 mg by mouth 2 (two) times daily as needed.  . nortriptyline (PAMELOR) 75 MG capsule Take 2 capsules (150 mg total) by mouth at bedtime.  . [DISCONTINUED] meloxicam (MOBIC) 7.5 MG tablet Take 15 mg by mouth daily.   Current Facility-Administered Medications for the 06/12/20 encounter (Office Visit) with Werner Lean, MD  Medication  . 0.9 %  sodium chloride infusion     Allergies:    Gadolinium derivatives and Grapefruit flavor [flavoring agent]   Social History   Socioeconomic History  . Marital status: Married    Spouse name: Journalist, newspaper  . Number of children: 3  . Years of education: Not on file  . Highest education level: 6th grade  Occupational History  . Not on file  Tobacco Use  . Smoking status: Current Every Day Smoker    Packs/day: 0.50    Types: Cigarettes  . Smokeless tobacco: Never Used  . Tobacco comment: smokes 3 cigarettes per day--02/20/2020  Vaping Use  . Vaping Use: Never used  Substance and Sexual Activity  . Alcohol use: Not Currently    Alcohol/week: 0.0 standard drinks    Comment: 2 a day  . Drug use: No    Types: "Crack" cocaine    Comment: JANUARY 2017  . Sexual activity: Yes    Birth control/protection: None  Other Topics Concern  . Not on file  Social History Narrative  . Not on file   Social Determinants of Health   Financial Resource Strain: Not on file  Food Insecurity: Not on file  Transportation Needs: Not on file  Physical Activity: Not on file  Stress: Not on file  Social Connections: Not on file    Social: lives with husband, originally from [place]  Family History: The patient's family history includes Alcohol abuse in her mother; Anxiety disorder in her father and mother; Depression in her mother; Drug abuse in her father and mother; Heart disease in her maternal grandmother; Stroke in her mother. There is no history of Colon cancer, Esophageal cancer, Pancreatic cancer, Stomach cancer, or Liver disease.  ROS:   Please see the history of present illness.     All other systems reviewed and are negative.  EKGs/Labs/Other Studies Reviewed:    The following studies were reviewed today:  EKG:  EKG is  ordered today.  The ekg ordered today demonstrates  06/11/20: SR 85 Borderline Ant Inf pattern 03/24/20- uninterpretable scan from PCP  Transthoracic Echocardiogram: Date: 06/06/20 Results: 1. Left ventricular  ejection fraction, by estimation, is 55 to 60%. The  left ventricle has normal function. The left ventricle has no regional  wall motion abnormalities. Left ventricular diastolic parameters are  consistent with Grade I diastolic  dysfunction (impaired relaxation).  2. Right ventricular systolic function is normal. The right ventricular  size is normal. Tricuspid regurgitation signal is inadequate for assessing  PA pressure.  3. The mitral valve is normal in structure. Trivial mitral valve  regurgitation. No evidence of mitral stenosis.  4. The aortic valve is tricuspid. Aortic valve regurgitation is trivial.  No aortic stenosis is present.    Recent Labs: 09/25/2019: ALT 20; TSH 5.147 04/08/2020: BUN 7; Creatinine, Ser 0.77; Hemoglobin  12.3; Platelets 392; Potassium 4.3; Sodium 139  Recent Lipid Panel    Component Value Date/Time   CHOL 178 10/30/2018 0930   TRIG 103 10/30/2018 0930   HDL 43 10/30/2018 0930   CHOLHDL 7.5 02/08/2015 0422   VLDL 70 (H) 02/08/2015 0422   LDLCALC 116 (H) 10/30/2018 0930   Risk Assessment/Calculations:     The 10-year ASCVD risk score Mikey Bussing DC Brooke Bonito., et al., 2013) is: 15%   Values used to calculate the score:     Age: 75 years     Sex: Female     Is Non-Hispanic African American: Yes     Diabetic: Yes     Tobacco smoker: Yes     Systolic Blood Pressure: 123456 mmHg     Is BP treated: Yes     HDL Cholesterol: 43 mg/dL     Total Cholesterol: 178 mg/dL   Physical Exam:    VS:  BP 118/68   Pulse 85   Ht 5\' 5"  (1.651 m)   Wt (!) 147.9 kg   LMP 05/29/2017 (Approximate)   SpO2 95%   BMI 54.25 kg/m     Wt Readings from Last 3 Encounters:  06/12/20 (!) 147.9 kg  04/17/20 (!) 144.6 kg  04/08/20 (!) 141.5 kg    GEN: Obesity well developed in no acute distress HEENT: Normal NECK: No JVD; No carotid bruits LYMPHATICS: No lymphadenopathy CARDIAC: RRR, no murmurs, rubs, gallops; distant heart sounds RESPIRATORY:  Clear to auscultation without  rales, wheezing or rhonchi  ABDOMEN: Soft, non-tender, non-distended MUSCULOSKELETAL:  Non pitting edema; No deformity  SKIN: Warm and dry NEUROLOGIC:  Alert and oriented x 3 PSYCHIATRIC:  Normal affect   ASSESSMENT:    1. DOE (dyspnea on exertion)   2. Precordial pain   3. Obesity, diabetes, and hypertension syndrome ([place])   4. Tobacco use   5. Hyperlipidemia associated with type 2 diabetes mellitus ([place])   6. OSA on CPAP   7. Obesity hypoventilation syndrome ([place])    PLAN:    In order of problems listed above:  Morbid Obesity HTN with DM DOE and possibly cardiac CP Tobacco Abuse with seizure history HLD with DM OSA on CPAP and OHS - ambulatory blood pressure not done; will try to get ambulatory BP cuff - will get BNP; may need to increase HCTZ 12.5mg  mg PO Daily to higher dose - lisinopril 20 mg PO BID per primary; atypical but not unreasonable - continue norvasc 10 mg PO Daily - atorvastatin 10 mg PO daily but likely not at goal; will look for primary prevention changes at next visit - discussed nicotine gum use and smoking cessation   Shared Decision Making/Informed Consent The risks [chest pain, shortness of breath, cardiac arrhythmias, dizziness, blood pressure fluctuations, myocardial infarction, stroke/transient ischemic attack, nausea, vomiting, allergic reaction, radiation exposure, metallic taste sensation and life-threatening complications (estimated to be 1 in 10,000)], benefits (risk stratification, diagnosing coronary artery disease, treatment guidance) and alternatives of a nuclear stress test were discussed in detail with [name] and she agrees to proceed.   Fall follow up unless new symptoms or abnormal test results warranting change in plan  Would be reasonable for  APP Follow up  Time Spent Directly with Patient:   I have spent a total of 60 with the patient reviewing notes, imaging, EKGs, labs and examining the patient as well as  establishing an assessment and plan that was discussed personally with the patient.  > 50% of time was  spent in direct patient care and and with family (virtually).   Medication Adjustments/Labs and Tests Ordered: Current medicines are reviewed at length with the patient today.  Concerns regarding medicines are outlined above.  Orders Placed This Encounter  Procedures  . Pro b natriuretic peptide (BNP)  . MYOCARDIAL PERFUSION IMAGING  . EKG 12-Lead   No orders of the defined types were placed in this encounter.   Patient Instructions   Medication Instructions:  Your physician recommends that you continue on your current medications as directed. Please refer to the Current Medication list given to you today.  *If you need a refill on your cardiac medications before your next appointment, please call your pharmacy*   Lab Work: TODAY: BNP If you have labs (blood work) drawn today and your tests are completely normal, you will receive your results only by: Marland Kitchen MyChart Message (if you have MyChart) OR . A paper copy in the mail If you have any lab test that is abnormal or we need to change your treatment, we will call you to review the results.   Testing/Procedures: Your physician has requested that you have en exercise stress myoview. For further information please visit [email or web address]. Please follow instruction sheet, as given.    Follow-Up: At [facility], you and your health needs are our priority.  As part of our continuing mission to provide you with exceptional heart care, we have created designated Provider Care Teams.  These Care Teams include your primary Cardiologist (physician) and Advanced Practice Providers (APPs -  Physician Assistants and Nurse Practitioners) who all work together to provide you with the care you need, when you need it.  We recommend signing up for the patient portal called "MyChart".  Sign up information is provided on this After Visit Summary.   MyChart is used to connect with patients for Virtual Visits (Telemedicine).  Patients are able to view lab/test results, encounter notes, upcoming appointments, etc.  Non-urgent messages can be sent to your provider as well.   To learn more about what you can do with MyChart, go to [email or web address].    Your next appointment:   5-6 month(s)  The format for your next appointment:   In Person  Provider:   You may see Rudean Haskell, MD or one of the following Advanced Practice Providers on your designated Care Team:    Melina Copa, PA-C  Ermalinda Barrios, PA-C    Other Instructions   You are scheduled for a Myocardial Perfusion Imaging Study. Please arrive 15 minutes prior to your appointment time for registration and insurance purposes.   The test will take approximately 3 to 4 hours to complete; you may bring reading material.  If someone comes with you to your appointment, they will need to remain in the main lobby due to limited space in the testing area. **If you are pregnant or breastfeeding, please notify the nuclear lab prior to your appointment**   How to prepare for your Myocardial Perfusion Test:  Do not eat or drink 3 hours prior to your test, except you may have water.  Do not consume products containing caffeine (regular or decaffeinated) 12 hours prior to your test. (ex: coffee, chocolate, sodas, tea).  Do bring a list of your current medications with you.  If not listed below, you may take your medications as normal.  Do wear comfortable clothes (no dresses or overalls) and walking shoes, tennis shoes preferred (No heels or open toe shoes are allowed).  Do NOT wear  cologne, perfume, aftershave, or lotions (deodorant is allowed).  If these instructions are not followed, your test will have to be rescheduled.  If you cannot keep your appointment, please provide 24 hours notification to the Nuclear Lab, to avoid a possible $50 charge to your account.      Steps to Quit Smoking Smoking tobacco is the leading cause of preventable death. It can affect almost every organ in the body. Smoking puts you and people around you at risk for many serious, long-lasting (chronic) diseases. Quitting smoking can be hard, but it is one of the best things that you can do for your health. It is never too late to quit. How do I get ready to quit? When you decide to quit smoking, make a plan to help you succeed. Before you quit:  Pick a date to quit. Set a date within the next 2 weeks to give you time to prepare.  Write down the reasons why you are quitting. Keep this list in places where you will see it often.  Tell your family, friends, and co-workers that you are quitting. Their support is important.  Talk with your doctor about the choices that may help you quit.  Find out if your health insurance will pay for these treatments.  Know the people, places, things, and activities that make you want to smoke (triggers). Avoid them. What first steps can I take to quit smoking?  Throw away all cigarettes at home, at work, and in your car.  Throw away the things that you use when you smoke, such as ashtrays and lighters.  Clean your car. Make sure to empty the ashtray.  Clean your home, including curtains and carpets. What can I do to help me quit smoking? Talk with your doctor about taking medicines and seeing a counselor at the same time. You are more likely to succeed when you do both.  If you are pregnant or breastfeeding, talk with your doctor about counseling or other ways to quit smoking. Do not take medicine to help you quit smoking unless your doctor tells you to do so. To quit smoking: Quit right away  Quit smoking totally, instead of slowly cutting back on how much you smoke over a period of time.  Go to counseling. You are more likely to quit if you go to counseling sessions regularly. Take medicine You may take medicines to help you quit.  Some medicines need a prescription, and some you can buy over-the-counter. Some medicines may contain a drug called nicotine to replace the nicotine in cigarettes. Medicines may:  Help you to stop having the desire to smoke (cravings).  Help to stop the problems that come when you stop smoking (withdrawal symptoms). Your doctor may ask you to use:  Nicotine patches, gum, or lozenges.  Nicotine inhalers or sprays.  Non-nicotine medicine that is taken by mouth. Find resources Find resources and other ways to help you quit smoking and remain smoke-free after you quit. These resources are most helpful when you use them often. They include:  Online chats with a Social worker.  Phone quitlines.  Printed Furniture conservator/restorer.  Support groups or group counseling.  Text messaging programs.  Mobile phone apps. Use apps on your mobile phone or tablet that can help you stick to your quit plan. There are many free apps for mobile phones and tablets as well as websites. Examples include Quit Guide from the [employer] and smokefree.gov   What things can I do to make it  easier to quit?  Talk to your family and friends. Ask them to support and encourage you.  Call a phone quitline (1-800-QUIT-NOW), reach out to support groups, or work with a Social worker.  Ask people who smoke to not smoke around you.  Avoid places that make you want to smoke, such as: ? Bars. ? Parties. ? Smoke-break areas at work.  Spend time with people who do not smoke.  Lower the stress in your life. Stress can make you want to smoke. Try these things to help your stress: ? Getting regular exercise. ? Doing deep-breathing exercises. ? Doing yoga. ? Meditating. ? Doing a body scan. To do this, close your eyes, focus on one area of your body at a time from head to toe. Notice which parts of your body are tense. Try to relax the muscles in those areas.   How will I feel when I quit smoking? Day 1 to 3 weeks Within the first 24  hours, you may start to have some problems that come from quitting tobacco. These problems are very bad 2-3 days after you quit, but they do not often last for more than 2-3 weeks. You may get these symptoms:  Mood swings.  Feeling restless, nervous, angry, or annoyed.  Trouble concentrating.  Dizziness.  Strong desire for high-sugar foods and nicotine.  Weight gain.  Trouble pooping (constipation).  Feeling like you may vomit (nausea).  Coughing or a sore throat.  Changes in how the medicines that you take for other issues work in your body.  Depression.  Trouble sleeping (insomnia). Week 3 and afterward After the first 2-3 weeks of quitting, you may start to notice more positive results, such as:  Better sense of smell and taste.  Less coughing and sore throat.  Slower heart rate.  Lower blood pressure.  Clearer skin.  Better breathing.  Fewer sick days. Quitting smoking can be hard. Do not give up if you fail the first time. Some people need to try a few times before they succeed. Do your best to stick to your quit plan, and talk with your doctor if you have any questions or concerns. Summary  Smoking tobacco is the leading cause of preventable death. Quitting smoking can be hard, but it is one of the best things that you can do for your health.  When you decide to quit smoking, make a plan to help you succeed.  Quit smoking right away, not slowly over a period of time.  When you start quitting, seek help from your doctor, family, or friends. This information is not intended to replace advice given to you by your health care provider. Make sure you discuss any questions you have with your health care provider. Document Revised: 10/06/2018 Document Reviewed: 04/01/2018 Elsevier Patient Education  2021 [place], Werner Lean, MD  06/12/2020 9:58 AM    [place]

## 2020-06-12 ENCOUNTER — Ambulatory Visit (INDEPENDENT_AMBULATORY_CARE_PROVIDER_SITE_OTHER): Payer: Medicaid Other | Admitting: Internal Medicine

## 2020-06-12 ENCOUNTER — Other Ambulatory Visit: Payer: Self-pay

## 2020-06-12 ENCOUNTER — Encounter: Payer: Self-pay | Admitting: Internal Medicine

## 2020-06-12 VITALS — BP 118/68 | HR 85 | Ht 65.0 in | Wt 326.0 lb

## 2020-06-12 DIAGNOSIS — Z720 Tobacco use: Secondary | ICD-10-CM

## 2020-06-12 DIAGNOSIS — E669 Obesity, unspecified: Secondary | ICD-10-CM

## 2020-06-12 DIAGNOSIS — R072 Precordial pain: Secondary | ICD-10-CM | POA: Diagnosis not present

## 2020-06-12 DIAGNOSIS — E66813 Morbid (severe) obesity with alveolar hypoventilation: Secondary | ICD-10-CM | POA: Insufficient documentation

## 2020-06-12 DIAGNOSIS — E1169 Type 2 diabetes mellitus with other specified complication: Secondary | ICD-10-CM | POA: Diagnosis not present

## 2020-06-12 DIAGNOSIS — R0600 Dyspnea, unspecified: Secondary | ICD-10-CM | POA: Diagnosis not present

## 2020-06-12 DIAGNOSIS — I152 Hypertension secondary to endocrine disorders: Secondary | ICD-10-CM

## 2020-06-12 DIAGNOSIS — Z9989 Dependence on other enabling machines and devices: Secondary | ICD-10-CM | POA: Insufficient documentation

## 2020-06-12 DIAGNOSIS — E785 Hyperlipidemia, unspecified: Secondary | ICD-10-CM

## 2020-06-12 DIAGNOSIS — R0609 Other forms of dyspnea: Secondary | ICD-10-CM

## 2020-06-12 DIAGNOSIS — G4733 Obstructive sleep apnea (adult) (pediatric): Secondary | ICD-10-CM | POA: Insufficient documentation

## 2020-06-12 DIAGNOSIS — E662 Morbid (severe) obesity with alveolar hypoventilation: Secondary | ICD-10-CM

## 2020-06-12 DIAGNOSIS — E1159 Type 2 diabetes mellitus with other circulatory complications: Secondary | ICD-10-CM | POA: Insufficient documentation

## 2020-06-12 HISTORY — DX: Obstructive sleep apnea (adult) (pediatric): G47.33

## 2020-06-12 HISTORY — DX: Obesity, class 3: E66.813

## 2020-06-12 HISTORY — DX: Morbid (severe) obesity with alveolar hypoventilation: E66.2

## 2020-06-12 HISTORY — DX: Other forms of dyspnea: R06.09

## 2020-06-12 NOTE — Patient Instructions (Addendum)
Medication Instructions:  Your physician recommends that you continue on your current medications as directed. Please refer to the Current Medication list given to you today.  *If you need a refill on your cardiac medications before your next appointment, please call your pharmacy*   Lab Work: TODAY: BNP If you have labs (blood work) drawn today and your tests are completely normal, you will receive your results only by: Marland Kitchen MyChart Message (if you have MyChart) OR . A paper copy in the mail If you have any lab test that is abnormal or we need to change your treatment, we will call you to review the results.   Testing/Procedures: Your physician has requested that you have en exercise stress myoview. For further information please visit [email or web address]. Please follow instruction sheet, as given.    Follow-Up: At [facility], you and your health needs are our priority.  As part of our continuing mission to provide you with exceptional heart care, we have created designated Provider Care Teams.  These Care Teams include your primary Cardiologist (physician) and Advanced Practice Providers (APPs -  Physician Assistants and Nurse Practitioners) who all work together to provide you with the care you need, when you need it.  We recommend signing up for the patient portal called "MyChart".  Sign up information is provided on this After Visit Summary.  MyChart is used to connect with patients for Virtual Visits (Telemedicine).  Patients are able to view lab/test results, encounter notes, upcoming appointments, etc.  Non-urgent messages can be sent to your provider as well.   To learn more about what you can do with MyChart, go to [email or web address].    Your next appointment:   5-6 month(s)  The format for your next appointment:   In Person  Provider:   You may see Rudean Haskell, MD or one of the following Advanced Practice Providers on your designated Care Team:    Melina Copa, PA-C  Ermalinda Barrios, PA-C    Other Instructions   You are scheduled for a Myocardial Perfusion Imaging Study. Please arrive 15 minutes prior to your appointment time for registration and insurance purposes.   The test will take approximately 3 to 4 hours to complete; you may bring reading material.  If someone comes with you to your appointment, they will need to remain in the main lobby due to limited space in the testing area. **If you are pregnant or breastfeeding, please notify the nuclear lab prior to your appointment**   How to prepare for your Myocardial Perfusion Test:  Do not eat or drink 3 hours prior to your test, except you may have water.  Do not consume products containing caffeine (regular or decaffeinated) 12 hours prior to your test. (ex: coffee, chocolate, sodas, tea).  Do bring a list of your current medications with you.  If not listed below, you may take your medications as normal.  Do wear comfortable clothes (no dresses or overalls) and walking shoes, tennis shoes preferred (No heels or open toe shoes are allowed).  Do NOT wear cologne, perfume, aftershave, or lotions (deodorant is allowed).  If these instructions are not followed, your test will have to be rescheduled.  If you cannot keep your appointment, please provide 24 hours notification to the Nuclear Lab, to avoid a possible $50 charge to your account.     Steps to Quit Smoking Smoking tobacco is the leading cause of preventable death. It can affect almost every organ in the body. Smoking puts you  and people around you at risk for many serious, long-lasting (chronic) diseases. Quitting smoking can be hard, but it is one of the best things that you can do for your health. It is never too late to quit. How do I get ready to quit? When you decide to quit smoking, make a plan to help you succeed. Before you quit:  Pick a date to quit. Set a date within the next 2 weeks to give you time to  prepare.  Write down the reasons why you are quitting. Keep this list in places where you will see it often.  Tell your family, friends, and co-workers that you are quitting. Their support is important.  Talk with your doctor about the choices that may help you quit.  Find out if your health insurance will pay for these treatments.  Know the people, places, things, and activities that make you want to smoke (triggers). Avoid them. What first steps can I take to quit smoking?  Throw away all cigarettes at home, at work, and in your car.  Throw away the things that you use when you smoke, such as ashtrays and lighters.  Clean your car. Make sure to empty the ashtray.  Clean your home, including curtains and carpets. What can I do to help me quit smoking? Talk with your doctor about taking medicines and seeing a counselor at the same time. You are more likely to succeed when you do both.  If you are pregnant or breastfeeding, talk with your doctor about counseling or other ways to quit smoking. Do not take medicine to help you quit smoking unless your doctor tells you to do so. To quit smoking: Quit right away  Quit smoking totally, instead of slowly cutting back on how much you smoke over a period of time.  Go to counseling. You are more likely to quit if you go to counseling sessions regularly. Take medicine You may take medicines to help you quit. Some medicines need a prescription, and some you can buy over-the-counter. Some medicines may contain a drug called nicotine to replace the nicotine in cigarettes. Medicines may:  Help you to stop having the desire to smoke (cravings).  Help to stop the problems that come when you stop smoking (withdrawal symptoms). Your doctor may ask you to use:  Nicotine patches, gum, or lozenges.  Nicotine inhalers or sprays.  Non-nicotine medicine that is taken by mouth. Find resources Find resources and other ways to help you quit smoking  and remain smoke-free after you quit. These resources are most helpful when you use them often. They include:  Online chats with a Social worker.  Phone quitlines.  Printed Furniture conservator/restorer.  Support groups or group counseling.  Text messaging programs.  Mobile phone apps. Use apps on your mobile phone or tablet that can help you stick to your quit plan. There are many free apps for mobile phones and tablets as well as websites. Examples include Quit Guide from the [employer] and smokefree.gov   What things can I do to make it easier to quit?  Talk to your family and friends. Ask them to support and encourage you.  Call a phone quitline (1-800-QUIT-NOW), reach out to support groups, or work with a Social worker.  Ask people who smoke to not smoke around you.  Avoid places that make you want to smoke, such as: ? Bars. ? Parties. ? Smoke-break areas at work.  Spend time with people who do not smoke.  Lower the stress  in your life. Stress can make you want to smoke. Try these things to help your stress: ? Getting regular exercise. ? Doing deep-breathing exercises. ? Doing yoga. ? Meditating. ? Doing a body scan. To do this, close your eyes, focus on one area of your body at a time from head to toe. Notice which parts of your body are tense. Try to relax the muscles in those areas.   How will I feel when I quit smoking? Day 1 to 3 weeks Within the first 24 hours, you may start to have some problems that come from quitting tobacco. These problems are very bad 2-3 days after you quit, but they do not often last for more than 2-3 weeks. You may get these symptoms:  Mood swings.  Feeling restless, nervous, angry, or annoyed.  Trouble concentrating.  Dizziness.  Strong desire for high-sugar foods and nicotine.  Weight gain.  Trouble pooping (constipation).  Feeling like you may vomit (nausea).  Coughing or a sore throat.  Changes in how the medicines that you take for other issues  work in your body.  Depression.  Trouble sleeping (insomnia). Week 3 and afterward After the first 2-3 weeks of quitting, you may start to notice more positive results, such as:  Better sense of smell and taste.  Less coughing and sore throat.  Slower heart rate.  Lower blood pressure.  Clearer skin.  Better breathing.  Fewer sick days. Quitting smoking can be hard. Do not give up if you fail the first time. Some people need to try a few times before they succeed. Do your best to stick to your quit plan, and talk with your doctor if you have any questions or concerns. Summary  Smoking tobacco is the leading cause of preventable death. Quitting smoking can be hard, but it is one of the best things that you can do for your health.  When you decide to quit smoking, make a plan to help you succeed.  Quit smoking right away, not slowly over a period of time.  When you start quitting, seek help from your doctor, family, or friends. This information is not intended to replace advice given to you by your health care provider. Make sure you discuss any questions you have with your health care provider. Document Revised: 10/06/2018 Document Reviewed: 04/01/2018 Elsevier Patient Education  [place].

## 2020-06-13 LAB — PRO B NATRIURETIC PEPTIDE: NT-Pro BNP: 16 pg/mL (ref 0–249)

## 2020-06-18 ENCOUNTER — Ambulatory Visit (INDEPENDENT_AMBULATORY_CARE_PROVIDER_SITE_OTHER): Payer: Medicaid Other | Admitting: Pulmonary Disease

## 2020-06-18 ENCOUNTER — Other Ambulatory Visit: Payer: Self-pay

## 2020-06-18 ENCOUNTER — Encounter: Payer: Self-pay | Admitting: Pulmonary Disease

## 2020-06-18 VITALS — BP 124/70 | HR 91 | Temp 98.0°F | Ht 65.0 in | Wt 329.8 lb

## 2020-06-18 DIAGNOSIS — R0600 Dyspnea, unspecified: Secondary | ICD-10-CM

## 2020-06-18 DIAGNOSIS — E662 Morbid (severe) obesity with alveolar hypoventilation: Secondary | ICD-10-CM

## 2020-06-18 DIAGNOSIS — R0609 Other forms of dyspnea: Secondary | ICD-10-CM

## 2020-06-18 DIAGNOSIS — R059 Cough, unspecified: Secondary | ICD-10-CM | POA: Diagnosis not present

## 2020-06-18 DIAGNOSIS — G4733 Obstructive sleep apnea (adult) (pediatric): Secondary | ICD-10-CM

## 2020-06-18 NOTE — Patient Instructions (Signed)
Will make sure your CT chest is schedule and call with results  Follow up in 6 months

## 2020-06-18 NOTE — Progress Notes (Signed)
[facility]  Chief Complaint  Patient presents with  . Follow-up    Shortness of breath with exertion.     Constitutional:  BP 124/70 (BP Location: Right Wrist, Cuff Size: Normal)   Pulse 91   Temp 98 F (36.7 C) (Temporal)   Ht 5\' 5"  (1.651 m)   Wt (!) 329 lb 12.8 oz (149.6 kg)   LMP 05/29/2017 (Approximate)   SpO2 96% Comment: RA  BMI 54.88 kg/m   Past Medical History:  Hemorrhagic CVA, Psychogenic seizures, PSTD, Depression, Anxiety, HTN, Syphilis, HLD, Overactive bladder, COVID 16 March 2019  Past Surgical History:  She  has a past surgical history that includes Cesarean section.  Brief Summary:  Denise [name] is a 50 y.o. female smoker with dyspnea, cough, and obstructive sleep apnea with obesity hypoventilation syndrome.      Subjective:   Echo from earlier this month showed grade 1 diastolic dysfunction.  She hasn't completed CT chest yet.    She was seen by cardiology recently.  She wasn't using flovent correctly.  She was shown proper technique and her breathing is better.  She is down to 3 cigarettes per day.  She smokes when she has coffee or after waking up.  No issues with CPAP set up.  Physical Exam:   Appearance - well kempt   ENMT - no sinus tenderness, no oral exudate, no LAN, Mallampati 3 airway, no stridor, edentulous  Respiratory - equal breath sounds bilaterally, no wheezing or rales  CV - s1s2 regular rate and rhythm, no murmurs  Ext - no clubbing, no edema  Skin - no rashes  Psych - normal mood and affect    Pulmonary testing:   PFT 04/17/20 >> FEV1 1.80 (75%), FEV1% 88, TLC 3.54 (68%), DLCO 58%  Chest Imaging:    Sleep Tests:   HST 10/07/17 >> AHI 25, SpO2 low 69%  PSG 08/23/19 >> AHI 10.6, SpO2 low 80%; CPAP 17 cm H2O with 2 liters O2  CPAP 05/19/20 to 06/17/20 >> used on 30 of 30 nights with average 9 hrs 41 min.  Average AHI 1.4 with CPAP 17 cm H2O. Some air  leak  Cardiac Tests:   Echo 06/06/20 >> EF 55 to 60%, grade 1 DD  Social History:  She  reports that she has been smoking cigarettes. She has been smoking about 0.50 packs per day. She has never used smokeless tobacco. She reports previous alcohol use. She reports that she does not use drugs.  Family History:  Her family history includes Alcohol abuse in her mother; Anxiety disorder in her father and mother; Depression in her mother; Drug abuse in her father and mother; Heart disease in her maternal grandmother; Stroke in her mother.     Assessment/Plan:   Obstructive sleep apnea. - she is compliant with CPAP and reports benefit from therapy - uses [employer] for her DME - continue CPAP 17 cm H2O  Obesity hypoventilation syndrome. - 2 liters oxygen at night with CPAP  Dyspnea on exertion with productive cough and history of tobacco abuse.  - likely has chronic bronchitis in setting of smoking, obesity with deconditioning - flovent and albuterol working better with proper inhaler technique - she has diffusion defect and restriction defect; will make sure HRCT chest scheduled to assess for early ILD  Tobacco abuse. - she will try using nicotine patch  Obesity. - encouraged her to keep up with weight loss efforts    Time Spent  Involved in Patient Care on Day of Examination:  32 minutes  Follow up:  Patient Instructions  Will make sure your CT chest is schedule and call with results  Follow up in 6 months   Medication List:   Allergies as of 06/18/2020      Reactions   Gadolinium Derivatives Nausea And Vomiting   Nausea and vomiting was followed by epileptic seizure episode that lasted approximately 5 minutes; pt was unable to verbally communicate during that time; she then came to and was able to speak; was evaluated by Rad and RN; kms    Grapefruit Flavor [flavoring Agent] Other (See Comments)   Drug interaction      Medication List       Accurate as of Jun 18, 2020   2:06 PM. If you have any questions, ask your nurse or doctor.        acetaminophen 325 MG tablet Commonly known as: TYLENOL Take 2 tablets (650 mg total) by mouth every 6 (six) hours as needed for mild pain (or Fever >/= 101).   Acetaminophen Extra Strength 500 MG tablet Generic drug: acetaminophen Take 1,000 mg by mouth every 8 (eight) hours as needed.   albuterol 108 (90 Base) MCG/ACT inhaler Commonly known as: VENTOLIN HFA Inhale 2 puffs into the lungs every 6 (six) hours as needed for wheezing or shortness of breath.   amLODipine 10 MG tablet Commonly known as: NORVASC Take 10 mg by mouth daily.   atorvastatin 10 MG tablet Commonly known as: LIPITOR Take 10 mg by mouth at bedtime.   benztropine 1 MG tablet Commonly known as: COGENTIN Take 1 tablet (1 mg total) by mouth 2 (two) times daily.   busPIRone 10 MG tablet Commonly known as: BUSPAR Take 2 tablets (20 mg total) by mouth 2 (two) times daily.   Flovent HFA 110 MCG/ACT inhaler Generic drug: fluticasone Inhale 2 puffs into the lungs in the morning and at bedtime.   gabapentin 600 MG tablet Commonly known as: Neurontin Take 1 tablet (600 mg total) by mouth 3 (three) times daily.   gabapentin 300 MG capsule Commonly known as: NEURONTIN Take 1 capsule by mouth 3 (three) times daily.   hydrochlorothiazide 12.5 MG tablet Commonly known as: HYDRODIURIL Take 12.5 mg by mouth daily.   lisinopril 20 MG tablet Commonly known as: ZESTRIL Take 20 mg by mouth 2 (two) times daily.   meloxicam 15 MG tablet Commonly known as: MOBIC Take 1 tablet by mouth as needed.   metFORMIN 500 MG tablet Commonly known as: GLUCOPHAGE Take 500 mg by mouth 2 (two) times daily.   methocarbamol 750 MG tablet Commonly known as: ROBAXIN Take 750 mg by mouth 2 (two) times daily as needed.   nortriptyline 75 MG capsule Commonly known as: Pamelor Take 2 capsules (150 mg total) by mouth at bedtime.   [name] 0.5 MG Tabs Generic  drug: Brexpiprazole Take 1 tablet (0.5 mg total) by mouth daily.       Signature:  Chesley Mires, MD [place] Pager - [phone] 06/18/2020, 2:06 PM

## 2020-06-24 ENCOUNTER — Other Ambulatory Visit: Payer: Self-pay | Admitting: Sports Medicine

## 2020-06-24 DIAGNOSIS — M545 Low back pain, unspecified: Secondary | ICD-10-CM

## 2020-06-24 DIAGNOSIS — S83242A Other tear of medial meniscus, current injury, left knee, initial encounter: Secondary | ICD-10-CM

## 2020-06-25 ENCOUNTER — Ambulatory Visit (HOSPITAL_COMMUNITY): Payer: Medicaid Other

## 2020-06-28 ENCOUNTER — Other Ambulatory Visit: Payer: Medicaid Other

## 2020-07-01 ENCOUNTER — Ambulatory Visit
Admission: RE | Admit: 2020-07-01 | Discharge: 2020-07-01 | Disposition: A | Discharge status: Home / Self Care | Payer: Medicaid Other | Source: Ambulatory Visit | Attending: Sports Medicine | Admitting: Sports Medicine

## 2020-07-01 ENCOUNTER — Other Ambulatory Visit: Payer: Self-pay

## 2020-07-01 DIAGNOSIS — M545 Low back pain, unspecified: Secondary | ICD-10-CM

## 2020-07-01 DIAGNOSIS — S83242A Other tear of medial meniscus, current injury, left knee, initial encounter: Secondary | ICD-10-CM

## 2020-07-01 NOTE — Addendum Note (Signed)
Addended by: Rudean Haskell A on: 07/01/2020 11:23 AM   Modules accepted: Orders

## 2020-07-04 ENCOUNTER — Other Ambulatory Visit (HOSPITAL_COMMUNITY): Payer: Medicaid Other

## 2020-07-14 ENCOUNTER — Other Ambulatory Visit: Payer: Self-pay | Admitting: Pulmonary Disease

## 2020-07-15 ENCOUNTER — Telehealth (HOSPITAL_COMMUNITY): Payer: Self-pay | Admitting: *Deleted

## 2020-07-15 NOTE — Telephone Encounter (Signed)
Patient given detailed instructions per Myocardial Perfusion Study Information Sheet for the test on 07/21/20 at 8:15. Patient notified to arrive 15 minutes early and that it is imperative to arrive on time for appointment to keep from having the test rescheduled.  If you need to cancel or reschedule your appointment, please call the office within 24 hours of your appointment. . Patient verbalized understanding.Denise [name]

## 2020-07-21 ENCOUNTER — Ambulatory Visit (HOSPITAL_COMMUNITY): Payer: Medicaid Other | Attending: Cardiology

## 2020-07-21 ENCOUNTER — Other Ambulatory Visit: Payer: Self-pay

## 2020-07-21 DIAGNOSIS — R072 Precordial pain: Secondary | ICD-10-CM | POA: Insufficient documentation

## 2020-07-21 MED ORDER — REGADENOSON 0.4 MG/5ML IV SOLN
0.4000 mg | Freq: Once | INTRAVENOUS | Status: AC
  Administered 2020-07-21: 0.4 mg via INTRAVENOUS

## 2020-07-21 MED ORDER — TECHNETIUM TC 99M TETROFOSMIN IV KIT
29.6000 | PACK | Freq: Once | INTRAVENOUS | Status: AC | PRN
  Administered 2020-07-21: 29.6 via INTRAVENOUS
  Filled 2020-07-21: qty 30

## 2020-07-22 ENCOUNTER — Ambulatory Visit (HOSPITAL_COMMUNITY): Payer: Medicaid Other | Attending: Cardiovascular Disease

## 2020-07-22 LAB — MYOCARDIAL PERFUSION IMAGING
LV dias vol: 70 mL (ref 46–106)
LV sys vol: 29 mL
Peak HR: 123 {beats}/min
Rest HR: 98 {beats}/min
SDS: 2
SRS: 1
SSS: 3
TID: 0.72

## 2020-07-22 MED ORDER — TECHNETIUM TC 99M TETROFOSMIN IV KIT
30.6000 | PACK | Freq: Once | INTRAVENOUS | Status: AC | PRN
  Administered 2020-07-22: 30.6 via INTRAVENOUS
  Filled 2020-07-22: qty 31

## 2020-08-28 ENCOUNTER — Telehealth (INDEPENDENT_AMBULATORY_CARE_PROVIDER_SITE_OTHER): Payer: Medicaid Other | Admitting: Psychiatry

## 2020-08-28 ENCOUNTER — Other Ambulatory Visit: Payer: Self-pay

## 2020-08-28 DIAGNOSIS — G2589 Other specified extrapyramidal and movement disorders: Secondary | ICD-10-CM | POA: Diagnosis not present

## 2020-08-28 DIAGNOSIS — F411 Generalized anxiety disorder: Secondary | ICD-10-CM | POA: Diagnosis not present

## 2020-08-28 DIAGNOSIS — F333 Major depressive disorder, recurrent, severe with psychotic symptoms: Secondary | ICD-10-CM | POA: Diagnosis not present

## 2020-08-28 DIAGNOSIS — F4312 Post-traumatic stress disorder, chronic: Secondary | ICD-10-CM

## 2020-08-28 MED ORDER — NORTRIPTYLINE HCL 75 MG PO CAPS: 150.0000 mg | ORAL_CAPSULE | Freq: Every day | ORAL | 0 refills | Status: DC

## 2020-08-28 MED ORDER — BUSPIRONE HCL 10 MG PO TABS: 20.0000 mg | ORAL_TABLET | Freq: Two times a day (BID) | ORAL | 0 refills | Status: DC

## 2020-08-28 MED ORDER — BENZTROPINE MESYLATE 1 MG PO TABS: 1.0000 mg | ORAL_TABLET | Freq: Two times a day (BID) | ORAL | 0 refills | Status: DC

## 2020-08-28 MED ORDER — REXULTI 0.5 MG PO TABS: 0.5000 mg | ORAL_TABLET | Freq: Every day | ORAL | 0 refills | Status: DC

## 2020-08-28 NOTE — Progress Notes (Signed)
Virtual Visit via Telephone Note  I connected with Denise [name] on 08/28/20 at  2:30 PM EDT by telephone and verified that I am speaking with the correct person using two identifiers.  Location: Patient: home Provider: office   I discussed the limitations, risks, security and privacy concerns of performing an evaluation and management service by telephone and the availability of in person appointments. I also discussed with the patient that there may be a patient responsible charge related to this service. The patient expressed understanding and agreed to proceed.   History of Present Illness: Denise [name] has been doing well. Her depression is stable. Over the last 2 weeks she only been depressed for 2 days. Denise [name] has been spending time with her MIL and enjoys it. Her sleep is ok and she wakes up feeling rested. She is still napping during the day. She denies anhedonia. Her appetite is fair. She denies SI/HI. Her PTSD symptoms are getting better. She is having less frequent intrusive memories. It has happened about 5 times in the last 2 weeks. She denies hypervigilance. Her anxiety is overall unchanged and remains situational.  This is the best she has ever felt and is very happy with this combination of meds.    Observations/Objective:  General Appearance: unable to assess  Eye Contact:  unable to assess  Speech:  Clear and Coherent and Slow  Volume:  Normal  Mood:  Euthymic  Affect:  Full Range  Thought Process:  Coherent and Descriptions of Associations: Intact  Orientation:  Full (Time, Place, and Person)  Thought Content:  Logical  Suicidal Thoughts:  No  Homicidal Thoughts:  No  Memory:  Immediate;   Good  Judgement:  Fair  Insight:  Good  Psychomotor Activity: unable to assess  Concentration:  Concentration: Good  Recall:  Good  Fund of Knowledge:  Fair  Language:  Good  Akathisia:  unable to assess  Handed:  unable to assess  AIMS (if indicated):     Assets:   Communication Skills Desire for Improvement Financial Resources/Insurance Housing Intimacy Social Support Talents/Skills Vocational/Educational  ADL's:  unable to assess  Cognition:  WNL  Sleep:        Assessment and Plan: Depression screen [facility] 2/9 08/28/2020 06/05/2020 04/24/2020 07/15/2016  Decreased Interest 0 '2 3 3  '$ Down, Depressed, Hopeless '1 2 3 3  '$ PHQ - 2 Score '1 4 6 6  '$ Altered sleeping - '3 3 3  '$ Tired, decreased energy - '3 3 3  '$ Change in appetite - 0 3 1  Feeling bad or failure about yourself  - '2 3 1  '$ Trouble concentrating - '3 3 3  '$ Moving slowly or fidgety/restless - 0 0 2  Suicidal thoughts - 0 0 0  PHQ-9 Score - '15 21 19  '$ Difficult doing work/chores - Very difficult Extremely dIfficult -    Flowsheet Row Video Visit from 08/28/2020 in [facility] Video Visit from 06/05/2020 in [facility] Video Visit from 04/24/2020 in [facility] Error: Question 1 not populated Error: Q3, 4, or 5 should not be populated when Q2 is No Error: Q3, 4, or 5 should not be populated when Q2 is No      1. Combined pyramidal-extrapyramidal syndrome - benztropine (COGENTIN) 1 MG tablet; Take 1 tablet (1 mg total) by mouth 2 (two) times daily.  Dispense: 180 tablet; Refill: 0  2. Severe episode of recurrent major depressive disorder, with psychotic  features (HCC) - Brexpiprazole ([name]) 0.5 MG TABS; Take 1 tablet (0.5 mg total) by mouth daily.  Dispense: 90 tablet; Refill: 0 - busPIRone (BUSPAR) 10 MG tablet; Take 2 tablets (20 mg total) by mouth 2 (two) times daily.  Dispense: 360 tablet; Refill: 0 - nortriptyline (PAMELOR) 75 MG capsule; Take 2 capsules (150 mg total) by mouth at bedtime.  Dispense: 180 capsule; Refill: 0  3. Chronic post-traumatic stress disorder (PTSD) - busPIRone (BUSPAR) 10 MG tablet; Take 2 tablets (20 mg total) by mouth 2 (two)  times daily.  Dispense: 360 tablet; Refill: 0  4. GAD (generalized anxiety disorder) - busPIRone (BUSPAR) 10 MG tablet; Take 2 tablets (20 mg total) by mouth 2 (two) times daily.  Dispense: 360 tablet; Refill: 0 - nortriptyline (PAMELOR) 75 MG capsule; Take 2 capsules (150 mg total) by mouth at bedtime.  Dispense: 180 capsule; Refill: 0    Follow Up Instructions: In 2-3 months or sooner if needed   I discussed the assessment and treatment plan with the patient. The patient was provided an opportunity to ask questions and all were answered. The patient agreed with the plan and demonstrated an understanding of the instructions.   The patient was advised to call back or seek an in-person evaluation if the symptoms worsen or if the condition fails to improve as anticipated.  I provided 12 minutes of non-face-to-face time during this encounter.   Charlcie Cradle, MD

## 2020-09-07 NOTE — Progress Notes (Signed)
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  If no improvement in 4 weeks, she will contact me and we will increase dose Follow up 6 months.  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   UPDATE: Increased gabapentin in February.  Noted some improvement initially but headaches became worse.  Her psychiatrist increased nortriptyline to '150mg'$  but no improvement.     Current NSAIDS:  none Current analgesics:  none Current anti-anxiolytic:  buspirone Current Antihypertensive medications:  Amlodipine, lisinopril, HCTZ Current Antidepressant/antipsychotic medications:  nortriptyline '150mg'$  at bedtime,  Current Anticonvulsant medications:  Gabapentin '600mg'$  three times daily. Other medications:  [name], BuSpar, Cogentin (for combined pyramidal-extrapyramidal syndrome)     HISTORY:  She developed headaches following hospitalization in late August 2021 for lithium toxicity presenting with confusion and headaches.  CT of head personally reviewed showed partially empty sella and known old left basal ganglia infarct but no acute intracranial abnormality.  She reports that the headaches have not improved since treatment.  She gets the headaches daily, about 2 hours a day.  It feels like that her entire top of head (in cap distribution) is being stabbed with hot pokers.  She also endorses a hot pressure sticking her in her left eye.  Also a pressure on top of head.  She reports associated drooling off of the left side of her mouth.   Separate from the headaches, she reports that she has periodic blurred vision.  She also zones out from time to time.  She has increased difficulty speaking and slurs her speech.  She has trouble just functioning and performing physical tasks.  MRI of brain without contrast on 11/05/2019 showed nonspecific chronic scattered white matter changes with chronic left basal ganglia infarcts and remote pontine microhemorrhage     Past NSAIDS:  Ibuprofen, meloxicam Past analgesics:  tramadol Past antidepressant medications:  Fluoxetine, citalopram, mirtazepine, Sertraline '200mg'$ , [name]  Past anticonvulsant medications:  topiramate '50mg'$   PAST MEDICAL HISTORY: Past Medical History:  Diagnosis Date   Abnormal EKG    AD (atopic dermatitis)    Alcohol use 04/14/2015   Anxiety    Blood transfusion without reported diagnosis    Depression    Diabetes mellitus without complication ([place])    Fibroid uterus 12/18/2014   Hyperlipemia    Hypertension    Memory changes    Overactive bladder    Pelvic pain in female 12/18/2014   Prediabetes    Right sided weakness    Seizures ([place])    Stroke ([place])    Substance abuse ([place])    Syncope 02/07/2015   TIA (transient ischemic attack)    Tobacco use 04/14/2015    MEDICATIONS: Current Outpatient Medications on File Prior to Visit  Medication Sig Dispense Refill   acetaminophen (TYLENOL) 325 MG tablet Take 2 tablets (650 mg total) by mouth every 6 (six) hours as needed for mild pain (or Fever >/= 101).     ACETAMINOPHEN EXTRA STRENGTH 500 MG tablet Take 1,000 mg by mouth every 8 (eight) hours as needed.     amLODipine (NORVASC) 10 MG tablet Take 10 mg by  mouth daily.     atorvastatin (LIPITOR) 10 MG tablet Take 10 mg by mouth at bedtime.  0   benztropine (COGENTIN) 1 MG tablet Take 1 tablet (1 mg total) by mouth 2 (two) times daily. 180 tablet 0   Brexpiprazole ([name]) 0.5 MG TABS Take 1 tablet (0.5 mg total) by mouth daily. 90 tablet 0   busPIRone  (BUSPAR) 10 MG tablet Take 2 tablets (20 mg total) by mouth 2 (two) times daily. 360 tablet 0   fluticasone (FLOVENT HFA) 110 MCG/ACT inhaler Inhale 2 puffs into the lungs in the morning and at bedtime. 1 each 12   gabapentin (NEURONTIN) 300 MG capsule Take 1 capsule by mouth 3 (three) times daily.     gabapentin (NEURONTIN) 600 MG tablet Take 1 tablet (600 mg total) by mouth 3 (three) times daily. 90 tablet 5   hydrochlorothiazide (HYDRODIURIL) 12.5 MG tablet Take 12.5 mg by mouth daily.     lisinopril (PRINIVIL,ZESTRIL) 20 MG tablet Take 20 mg by mouth 2 (two) times daily.      meloxicam (MOBIC) 15 MG tablet Take 1 tablet by mouth as needed.     metFORMIN (GLUCOPHAGE) 500 MG tablet Take 500 mg by mouth 2 (two) times daily.  3   methocarbamol (ROBAXIN) 750 MG tablet Take 750 mg by mouth 2 (two) times daily as needed.     nortriptyline (PAMELOR) 75 MG capsule Take 2 capsules (150 mg total) by mouth at bedtime. 180 capsule 0   PROAIR HFA 108 (90 Base) MCG/ACT inhaler inhale 2 PUFFS into THE lungs EVERY 6 HOURS AS NEEDED FOR WHEEZING OR SHORTNESS OF BREATH 8.5 g 11   Current Facility-Administered Medications on File Prior to Visit  Medication Dose Route Frequency Provider Last Rate Last Admin   0.9 %  sodium chloride infusion  500 mL Intravenous Continuous Danis, Estill Cotta III, MD        ALLERGIES: Allergies  Allergen Reactions   Gadolinium Derivatives Nausea And Vomiting    Nausea and vomiting was followed by epileptic seizure episode that lasted approximately 5 minutes; pt was unable to verbally communicate during that time; she then came to and was able to speak; was evaluated by Rad and RN; kms    Grapefruit Flavor [Flavoring Agent] Other (See Comments)    Drug interaction    FAMILY HISTORY: Family History  Problem Relation Age of Onset   Stroke Mother    Anxiety disorder Mother    Drug abuse Mother    Alcohol abuse Mother    Depression Mother    Heart disease Maternal Grandmother     Drug abuse Father    Anxiety disorder Father    Colon cancer Neg Hx    Esophageal cancer Neg Hx    Pancreatic cancer Neg Hx    Stomach cancer Neg Hx    Liver disease Neg Hx       Objective:  Blood pressure 125/79, pulse 97, height '5\' 5"'$  (1.651 m), weight (!) 330 lb (149.7 kg), last menstrual period 05/29/2017, SpO2 95 %. General: No acute distress.  Patient appears well-groomed.   Head:  Normocephalic/atraumatic Eyes:  Fundi examined but not visualized Neck: supple, no paraspinal tenderness, full range of motion Heart:  Regular rate and rhythm Lungs:  Clear to auscultation bilaterally Back: No paraspinal tenderness Neurological Exam: alert and oriented to person, place, and time.  Speech fluent and not dysarthric, language intact.  CN II-XII intact. Bulk and tone normal, muscle strength 5/5 throughout.  Sensation to light touch intact.  Deep tendon reflexes 2+ throughout, toes downgoing.  Finger to nose testing intact.  Gait normal, Romberg negative.   Metta Clines, DO

## 2020-09-08 ENCOUNTER — Other Ambulatory Visit: Payer: Self-pay

## 2020-09-08 ENCOUNTER — Ambulatory Visit: Payer: Medicaid Other | Admitting: Neurology

## 2020-09-08 ENCOUNTER — Encounter: Payer: Self-pay | Admitting: Neurology

## 2020-09-08 VITALS — BP 125/79 | HR 97 | Ht 65.0 in | Wt 330.0 lb

## 2020-09-08 DIAGNOSIS — M792 Neuralgia and neuritis, unspecified: Secondary | ICD-10-CM | POA: Diagnosis not present

## 2020-09-08 MED ORDER — GABAPENTIN 300 MG PO CAPS: ORAL_CAPSULE | ORAL | 5 refills | Status: DC

## 2020-09-08 NOTE — Patient Instructions (Signed)
I have increased gabapentin.  Stop the '600mg'$  capsule.  Start '300mg'$  capsule - take 2 capsules in morning, 2 capsules in afternoon and 3 capsules at bedtime.  If no improvement in headache pain in 4 weeks, contact me  Follow up 6 months.

## 2020-10-01 ENCOUNTER — Ambulatory Visit (INDEPENDENT_AMBULATORY_CARE_PROVIDER_SITE_OTHER): Payer: Medicaid Other | Admitting: Podiatry

## 2020-10-01 ENCOUNTER — Other Ambulatory Visit: Payer: Self-pay

## 2020-10-01 ENCOUNTER — Encounter: Payer: Self-pay | Admitting: Podiatry

## 2020-10-01 DIAGNOSIS — B351 Tinea unguium: Secondary | ICD-10-CM | POA: Diagnosis not present

## 2020-10-01 DIAGNOSIS — M79674 Pain in right toe(s): Secondary | ICD-10-CM

## 2020-10-01 DIAGNOSIS — M79675 Pain in left toe(s): Secondary | ICD-10-CM | POA: Diagnosis not present

## 2020-10-01 NOTE — Progress Notes (Signed)
This patient presents to the office with chief complaint of long thick nails     This patient says there are long thick painful nails.  These nails are painful walking and wearing shoes.   Patient is unable to  self treat her own nails . This patient presents  to the office today for treatment of the  long nails.  General Appearance  Alert, conversant and in no acute stress.  Vascular  Dorsalis pedis and posterior tibial  pulses are weakly  palpable  bilaterally.  Capillary return is within normal limits  bilaterally. Temperature is within normal limits  bilaterally.  Neurologic  Senn-Weinstein monofilament wire test within normal limits/diminished  B/L.  Muscle power within normal limits bilaterally.  Nails Thick disfigured discolored nails with subungual debris  from hallux to fifth toes bilaterally. No evidence of bacterial infection or drainage bilaterally.  Orthopedic  No limitations of motion of motion feet .  No crepitus or effusions noted.  No bony pathology or digital deformities noted.  Skin  normotropic skin with no porokeratosis noted bilaterally.  No signs of infections or ulcers noted.     Onychomycosis    Debride nails x 10.    RTC 4 months.  Patient states she is having foot and ankle pain.  Referred to medical doctors for treatment.   Gardiner Barefoot DPM

## 2020-10-13 ENCOUNTER — Encounter (HOSPITAL_COMMUNITY): Payer: Self-pay | Admitting: *Deleted

## 2020-10-13 ENCOUNTER — Emergency Department (HOSPITAL_COMMUNITY): Payer: Medicaid Other

## 2020-10-13 ENCOUNTER — Emergency Department (HOSPITAL_COMMUNITY)
Admission: EM | Admit: 2020-10-13 | Discharge: 2020-10-13 | Disposition: A | Discharge status: Home / Self Care | Payer: Medicaid Other | Attending: Emergency Medicine | Admitting: Emergency Medicine

## 2020-10-13 DIAGNOSIS — R062 Wheezing: Secondary | ICD-10-CM | POA: Insufficient documentation

## 2020-10-13 DIAGNOSIS — Z20822 Contact with and (suspected) exposure to covid-19: Secondary | ICD-10-CM | POA: Diagnosis not present

## 2020-10-13 DIAGNOSIS — M7918 Myalgia, other site: Secondary | ICD-10-CM | POA: Diagnosis not present

## 2020-10-13 DIAGNOSIS — M791 Myalgia, unspecified site: Secondary | ICD-10-CM

## 2020-10-13 DIAGNOSIS — E119 Type 2 diabetes mellitus without complications: Secondary | ICD-10-CM | POA: Diagnosis not present

## 2020-10-13 DIAGNOSIS — I10 Essential (primary) hypertension: Secondary | ICD-10-CM | POA: Insufficient documentation

## 2020-10-13 DIAGNOSIS — R0602 Shortness of breath: Secondary | ICD-10-CM | POA: Diagnosis not present

## 2020-10-13 DIAGNOSIS — F17210 Nicotine dependence, cigarettes, uncomplicated: Secondary | ICD-10-CM | POA: Diagnosis not present

## 2020-10-13 DIAGNOSIS — R059 Cough, unspecified: Secondary | ICD-10-CM | POA: Insufficient documentation

## 2020-10-13 DIAGNOSIS — Z79899 Other long term (current) drug therapy: Secondary | ICD-10-CM | POA: Diagnosis not present

## 2020-10-13 HISTORY — DX: Unspecified osteoarthritis, unspecified site: M19.90

## 2020-10-13 LAB — CBC WITH DIFFERENTIAL/PLATELET
Abs Immature Granulocytes: 0.05 10*3/uL (ref 0.00–0.07)
Basophils Absolute: 0 10*3/uL (ref 0.0–0.1)
Basophils Relative: 0 %
Eosinophils Absolute: 0.2 10*3/uL (ref 0.0–0.5)
Eosinophils Relative: 2 %
HCT: 45.3 % (ref 36.0–46.0)
Hemoglobin: 14.5 g/dL (ref 12.0–15.0)
Immature Granulocytes: 1 %
Lymphocytes Relative: 25 %
Lymphs Abs: 2.3 10*3/uL (ref 0.7–4.0)
MCH: 28.5 pg (ref 26.0–34.0)
MCHC: 32 g/dL (ref 30.0–36.0)
MCV: 89 fL (ref 80.0–100.0)
Monocytes Absolute: 0.5 10*3/uL (ref 0.1–1.0)
Monocytes Relative: 5 %
Neutro Abs: 6.1 10*3/uL (ref 1.7–7.7)
Neutrophils Relative %: 67 %
Platelets: 390 10*3/uL (ref 150–400)
RBC: 5.09 MIL/uL (ref 3.87–5.11)
RDW: 16.6 % — ABNORMAL HIGH (ref 11.5–15.5)
WBC: 9.1 10*3/uL (ref 4.0–10.5)
nRBC: 0 % (ref 0.0–0.2)

## 2020-10-13 LAB — COMPREHENSIVE METABOLIC PANEL
ALT: 19 U/L (ref 0–44)
AST: 17 U/L (ref 15–41)
Albumin: 4.2 g/dL (ref 3.5–5.0)
Alkaline Phosphatase: 103 U/L (ref 38–126)
Anion gap: 8 (ref 5–15)
BUN: 10 mg/dL (ref 6–20)
CO2: 30 mmol/L (ref 22–32)
Calcium: 10.2 mg/dL (ref 8.9–10.3)
Chloride: 107 mmol/L (ref 98–111)
Creatinine, Ser: 0.73 mg/dL (ref 0.44–1.00)
GFR, Estimated: >60 mL/min (ref >60)
Glucose, Bld: 111 mg/dL — ABNORMAL HIGH (ref 70–99)
Potassium: 4 mmol/L (ref 3.5–5.1)
Sodium: 145 mmol/L (ref 135–145)
Total Bilirubin: 0.3 mg/dL (ref 0.3–1.2)
Total Protein: 8.1 g/dL (ref 6.5–8.1)

## 2020-10-13 LAB — RESP PANEL BY RT-PCR (FLU A&B, COVID) ARPGX2
Influenza A by PCR: NEGATIVE
Influenza B by PCR: NEGATIVE
SARS Coronavirus 2 by RT PCR: NEGATIVE

## 2020-10-13 LAB — BRAIN NATRIURETIC PEPTIDE: B Natriuretic Peptide: 11.4 pg/mL (ref 0.0–100.0)

## 2020-10-13 MED ORDER — ACETAMINOPHEN 325 MG PO TABS
650.0000 mg | ORAL_TABLET | Freq: Once | ORAL | Status: AC
  Administered 2020-10-13: 650 mg via ORAL
  Filled 2020-10-13: qty 2

## 2020-10-13 NOTE — ED Provider Notes (Signed)
[name] Provider Note   CSN: [id number] Arrival date & time: 10/13/20  1043     History Chief Complaint  Patient presents with   Generalized Body Aches   Cough    Denise [name] is a 50 y.o. female past medical history significant for prior heart attack, prior stroke, diabetes, tobacco use who reports after having a COVID exposure 3 to 4 days ago.  Patient reports she has been having some aching, pain in her back and legs the last several days, she has had a cough, and sore throat.  Patient endorses some difficulty breathing, without chest pain, she endorses some nausea and vomiting, denies constipation, diarrhea.  Patient has not taken anything at this time for her symptoms.   Cough Associated symptoms: shortness of breath and wheezing   Associated symptoms: no chest pain       Past Medical History:  Diagnosis Date   Abnormal EKG    AD (atopic dermatitis)    Alcohol use 04/14/2015   Anxiety    Arthritis    Blood transfusion without reported diagnosis    Depression    Diabetes mellitus without complication ([place])    Fibroid uterus 12/18/2014   Hyperlipemia    Hypertension    Memory changes    Overactive bladder    Pelvic pain in female 12/18/2014   Prediabetes    Right sided weakness    Seizures ([place])    Stroke ([place])    Substance abuse ([place])    Syncope 02/07/2015   TIA (transient ischemic attack)    Tobacco use 04/14/2015    Patient Active Problem List   Diagnosis Date Noted   Obesity, diabetes, and hypertension syndrome ([place]) 06/12/2020   DOE (dyspnea on exertion) 06/12/2020   Precordial pain 06/12/2020   OSA on CPAP 06/12/2020   Obesity hypoventilation syndrome ([place]) 06/12/2020   Pain due to onychomycosis of toenails of both feet 05/26/2020   Lithium toxicity 09/25/2019   Obesity 09/25/2019   Leukocytosis 09/25/2019   Snoring 08/23/2019   Severe episode of recurrent major depressive disorder, without  psychotic features ([place]) 11/03/2017   Social anxiety disorder 11/03/2017   Psychogenic nonepileptic seizure 02/17/2017   History of latent syphilis 02/16/2017   Tobacco use 04/14/2015   Alcohol use 04/14/2015   Right sided weakness    Syncope 02/07/2015   Pelvic pain in female 12/18/2014   Fibroid uterus 12/18/2014   Hypertension 12/18/2014    Past Surgical History:  Procedure Laterality Date   CESAREAN SECTION     x 2     OB History     Gravida  3   Para  3   Term      Preterm      AB      Living  3      SAB      IAB      Ectopic      Multiple      Live Births              Family History  Problem Relation Age of Onset   Stroke Mother    Anxiety disorder Mother    Drug abuse Mother    Alcohol abuse Mother    Depression Mother    Heart disease Maternal Grandmother    Drug abuse Father    Anxiety disorder Father    Colon cancer Neg Hx    Esophageal cancer Neg Hx    Pancreatic cancer Neg Hx  Stomach cancer Neg Hx    Liver disease Neg Hx     Social History   Tobacco Use   Smoking status: Every Day    Packs/day: 0.50    Types: Cigarettes   Smokeless tobacco: Never  Vaping Use   Vaping Use: Never used  Substance Use Topics   Alcohol use: Yes    Alcohol/week: 2.0 standard drinks    Types: 2 Standard drinks or equivalent per week   Drug use: Not Currently    Types: "Crack" cocaine    Home Medications Prior to Admission medications   Medication Sig Start Date End Date Taking? Authorizing Provider  amLODipine (NORVASC) 10 MG tablet Take 10 mg by mouth daily.    [provider]  atorvastatin (LIPITOR) 10 MG tablet Take 10 mg by mouth at bedtime. 06/29/15   [provider]  benztropine (COGENTIN) 1 MG tablet Take 1 tablet (1 mg total) by mouth 2 (two) times daily. 08/28/20 08/28/21  Charlcie Cradle, MD  Brexpiprazole ([name]) 0.5 MG TABS Take 1 tablet (0.5 mg total) by mouth daily. 08/28/20   Charlcie Cradle, MD  busPIRone  (BUSPAR) 10 MG tablet Take 2 tablets (20 mg total) by mouth 2 (two) times daily. 08/28/20   Charlcie Cradle, MD  fluticasone (FLOVENT HFA) 110 MCG/ACT inhaler Inhale 2 puffs into the lungs in the morning and at bedtime. 02/20/20   Chesley Mires, MD  gabapentin (NEURONTIN) 300 MG capsule Take 2 capsules in morning, 2 capsules in afternoon and 3 capsules at bedtime. 09/08/20   Pieter Partridge, DO  hydrochlorothiazide (HYDRODIURIL) 12.5 MG tablet Take 12.5 mg by mouth daily.    [provider]  lisinopril (PRINIVIL,ZESTRIL) 20 MG tablet Take 20 mg by mouth 2 (two) times daily.     [provider]  meloxicam (MOBIC) 15 MG tablet Take 1 tablet by mouth as needed. 05/23/20   [provider]  metFORMIN (GLUCOPHAGE) 500 MG tablet Take 500 mg by mouth 2 (two) times daily. 05/25/17   [provider]  methocarbamol (ROBAXIN) 750 MG tablet Take 750 mg by mouth 2 (two) times daily as needed. 05/21/20   [provider]  nortriptyline (PAMELOR) 75 MG capsule Take 2 capsules (150 mg total) by mouth at bedtime. 08/28/20 08/28/21  Charlcie Cradle, MD  PROAIR HFA 108 [phone] Base) MCG/ACT inhaler inhale 2 PUFFS into THE lungs EVERY 6 HOURS AS NEEDED FOR WHEEZING OR SHORTNESS OF BREATH 07/14/20   Chesley Mires, MD    Allergies    Gadolinium derivatives and Grapefruit flavor [flavoring agent]  Review of Systems   Review of Systems  Respiratory:  Positive for cough, shortness of breath and wheezing. Negative for chest tightness.   Cardiovascular:  Positive for leg swelling. Negative for chest pain.   Physical Exam Updated Vital Signs BP (!) 175/86   Pulse 94   Temp 98.4 F (36.9 C) (Oral)   Resp 19   LMP 05/29/2017 (Approximate)   SpO2 94%   Physical Exam Vitals and nursing note reviewed.  Constitutional:      General: She is not in acute distress.    Appearance: Normal appearance.  HENT:     Head: Normocephalic and atraumatic.  Eyes:     General:        Right eye: No  discharge.        Left eye: No discharge.  Cardiovascular:     Rate and Rhythm: Normal rate and regular rhythm.     Heart sounds: No  murmur heard.   No friction rub. No gallop.  Pulmonary:     Effort: Pulmonary effort is normal.     Comments: Breath sounds in all lung fields, with some minimal end expiratory wheezing, no focal area of consolidation.  No accessory muscle use, not in respiratory distress. Abdominal:     General: Bowel sounds are normal.     Palpations: Abdomen is soft.  Musculoskeletal:     Comments: Tenderness to palpation of the left hip, paraspinous muscles of the back, tenderness to palpation of bilateral lower extremities with some swelling, without redness or heat.  Patient is able to walk with pain, without gait abnormality.  Skin:    General: Skin is warm and dry.     Capillary Refill: Capillary refill takes less than 2 seconds.  Neurological:     Mental Status: She is alert and oriented to person, place, and time.  Psychiatric:        Mood and Affect: Mood normal.        Behavior: Behavior normal.    ED Results / Procedures / Treatments   Labs (all labs ordered are listed, but only abnormal results are displayed) Labs Reviewed  CBC WITH DIFFERENTIAL/PLATELET - Abnormal; Notable for the following components:      Result Value   RDW 16.6 (*)    All other components within normal limits  COMPREHENSIVE METABOLIC PANEL - Abnormal; Notable for the following components:   Glucose, Bld 111 (*)    All other components within normal limits  RESP PANEL BY RT-PCR (FLU A&B, COVID) [id number]  BRAIN NATRIURETIC PEPTIDE    EKG EKG Interpretation  Date/Time:  Monday October 13 2020 16:04:39 EDT Ventricular Rate:  82 PR Interval:  172 QRS Duration: 85 QT Interval:  389 QTC Calculation: 455 R Axis:   35 Text Interpretation: Sinus rhythm Low voltage, precordial leads Consider anterior infarct since last tracing no significant change Confirmed by Daleen Bo  [phone]) on 10/13/2020 5:04:01 PM  Radiology DG Chest Port 1 View  Result Date: 10/13/2020 CLINICAL DATA:  Shortness of breath chest pain and cough. EXAM: PORTABLE CHEST 1 VIEW COMPARISON:  Two-view chest x-ray 07/20/2020 FINDINGS: Heart is upper limits of, exaggerated by low lung volumes. Atherosclerotic changes again noted at the aortic arch. Lungs are clear. No edema or effusion is present. Axial skeleton is within limits. IMPRESSION: 1. No acute cardiopulmonary disease. 2. Aortic atherosclerosis. Electronically Signed   By: San Morelle M.D.   On: 10/13/2020 17:05    Procedures Procedures   Medications Ordered in ED Medications  acetaminophen (TYLENOL) tablet 650 mg (650 mg Oral Given 10/13/20 1551)    ED Course  I have reviewed the triage vital signs and the nursing notes.  Pertinent labs & imaging results that were available during my care of the patient were reviewed by me and considered in my medical decision making (see chart for details).    MDM Rules/Calculators/A&P                         I discussed this case with my attending physician who cosigned this note including patient's presenting symptoms, physical exam, and planned diagnostics and interventions. Attending physician stated agreement with plan or made changes to plan which were implemented.   Patient with signs and symptoms of COVID or other upper respiratory infection, but does have many risk factors for a complicated course including previous heart disease, previous CVA, diabetes, tobacco smoking history.  Initial lab work shows: reassuring CBC, CMP, EKG, and CXR. Patient's pain and symptoms are improved with tylenol x 1. Based on her initial work-up, discussed no evidence of ongoing heart condition, acute pneumonia, COVID, or Flu at this time. Discussed based on her constellation of symptoms she may still have some respiratory infection at this time. No concern for respiratory distress, wheezing improved with  rest. No symptoms of sciatica. Recommend tylenol and ibuprofen for pain control.  Based on her medical history I recommend that she follow up at her earliest convenience for further evaluation if her symptoms worsen or fail to improve. Return precautions given. Final Clinical Impression(s) / ED Diagnoses Final diagnoses:  Cough  Myalgia    Rx / DC Orders ED Discharge Orders     None        Dorien Chihuahua 10/13/20 1823    Daleen Bo, MD 10/14/20 [phone]

## 2020-10-13 NOTE — ED Triage Notes (Signed)
Per EMS, pt from home reports body aches, cough over the weekend. Reports may have been exposed to someone with COVID. Wheezing in upper lung fields. Also has lower leg edema for unknown period of time.   O2 95% RA BP 146/78 HR 98 RR 22 Temp 98.4 CBG 193

## 2020-10-13 NOTE — Discharge Instructions (Addendum)
As we discussed you do not have flu or COVID, your other lab work-up was reassuring today.  Follow-up with your primary care if you continue to have symptoms.  Please return if you begin to have worsening symptoms.

## 2020-10-13 NOTE — ED Notes (Signed)
Patient left before discharge could be completed.  

## 2020-11-03 ENCOUNTER — Other Ambulatory Visit: Payer: Self-pay | Admitting: Neurology

## 2020-11-06 NOTE — Progress Notes (Signed)
Cardiology Office Note:    Date:  11/07/2020   ID:  Denise [name], DOB 06/11/1970, MRN [id number]  PCP:  [facility]   [facility] Providers Cardiologist:  None     CC: DOE follow up.  History of Present Illness:    Denise [name] is a 50 y.o. female with a hx of Morbid Obesity, HTN with DM, Tobacco Abuse and seizures,  OSA, on CPAP and OHS, who presents for evaluation 06/12/20.  In interim of this visit, patient had stress test.  Increased HCTZ to 25 mg PO daily.   Seen 11/07/20.  Patient notes that she is feeling weak and low.  Notes a bit of depression, no SI, and is taking medication.   There are no interval hospital/ED visit.    Has sharp chest pain that lasts about 5 minutes that goes into her right arm. This pain feels like someone is pushing something in her.  This chest pain worsens when she moves her head or her shoulder.  CP occurs  when she moves her right leg.  Still has DOE and has LE swelling.  Also has leg pain- this occurs constantly.  Patient notes that everything hurst when she moves.  Walking makes her pain worse.    Past Medical History:  Diagnosis Date   Abnormal EKG    AD (atopic dermatitis)    Alcohol use 04/14/2015   Anxiety    Arthritis    Blood transfusion without reported diagnosis    Depression    Diabetes mellitus without complication ([place])    Fibroid uterus 12/18/2014   Hyperlipemia    Hypertension    Memory changes    Overactive bladder    Pelvic pain in female 12/18/2014   Prediabetes    Right sided weakness    Seizures ([place])    Stroke ([place])    Substance abuse ([place])    Syncope 02/07/2015   TIA (transient ischemic attack)    Tobacco use 04/14/2015    Past Surgical History:  Procedure Laterality Date   CESAREAN SECTION     x 2    Current Medications: Current Meds  Medication Sig   amLODipine (NORVASC) 10 MG tablet Take 10 mg by mouth daily.   atorvastatin (LIPITOR) 10 MG tablet  Take 10 mg by mouth at bedtime.   benztropine (COGENTIN) 1 MG tablet Take 1 tablet (1 mg total) by mouth 2 (two) times daily.   Brexpiprazole ([name]) 0.5 MG TABS Take 1 tablet (0.5 mg total) by mouth daily.   busPIRone (BUSPAR) 10 MG tablet Take 2 tablets (20 mg total) by mouth 2 (two) times daily.   fluticasone (FLOVENT HFA) 110 MCG/ACT inhaler Inhale 2 puffs into the lungs in the morning and at bedtime.   furosemide (LASIX) 40 MG tablet Take 1 tablet (40 mg total) by mouth daily.   gabapentin (NEURONTIN) 300 MG capsule Take 2 capsules in morning, 2 capsules in afternoon and 3 capsules at bedtime.   lisinopril (PRINIVIL,ZESTRIL) 20 MG tablet Take 20 mg by mouth 2 (two) times daily.    meloxicam (MOBIC) 15 MG tablet Take 1 tablet by mouth as needed.   metFORMIN (GLUCOPHAGE) 500 MG tablet Take 500 mg by mouth 2 (two) times daily.   methocarbamol (ROBAXIN) 750 MG tablet Take 750 mg by mouth 2 (two) times daily as needed.   nortriptyline (PAMELOR) 75 MG capsule Take 2 capsules (150 mg total) by mouth at bedtime.   PROAIR HFA 108 (90 Base)  MCG/ACT inhaler inhale 2 PUFFS into THE lungs EVERY 6 HOURS AS NEEDED FOR WHEEZING OR SHORTNESS OF BREATH   [DISCONTINUED] hydrochlorothiazide (HYDRODIURIL) 12.5 MG tablet Take 12.5 mg by mouth daily.     Allergies:   Gadolinium derivatives and Grapefruit flavor [flavoring agent]   Social History   Socioeconomic History   Marital status: Married    Spouse name: [name]   Number of children: 3   Years of education: 6   Highest education level: 6th grade  Occupational History   Not on file  Tobacco Use   Smoking status: Every Day    Packs/day: 0.50    Types: Cigarettes   Smokeless tobacco: Never  Vaping Use   Vaping Use: Never used  Substance and Sexual Activity   Alcohol use: Yes    Alcohol/week: 2.0 standard drinks    Types: 2 Standard drinks or equivalent per week   Drug use: Not Currently    Types: "Crack" cocaine   Sexual activity:  Yes    Partners: Male    Birth control/protection: None  Other Topics Concern   Not on file  Social History Narrative   Not on file   Social Determinants of Health   Financial Resource Strain: Not on file  Food Insecurity: Not on file  Transportation Needs: Not on file  Physical Activity: Not on file  Stress: Not on file  Social Connections: Not on file    Social: lives with husband, originally from [place], feels depressed  Family History: The patient's family history includes Alcohol abuse in her mother; Anxiety disorder in her father and mother; Depression in her mother; Drug abuse in her father and mother; Heart disease in her maternal grandmother; Stroke in her mother. There is no history of Colon cancer, Esophageal cancer, Pancreatic cancer, Stomach cancer, or Liver disease.  ROS:   Please see the history of present illness.     All other systems reviewed and are negative.  EKGs/Labs/Other Studies Reviewed:    The following studies were reviewed today:  EKG:   06/11/20: SR 85 Borderline Ant Inf pattern 03/24/20- uninterpretable scan from PCP  Transthoracic Echocardiogram: Date: 06/06/20 Results:  1. Left ventricular ejection fraction, by estimation, is 55 to 60%. The  left ventricle has normal function. The left ventricle has no regional  wall motion abnormalities. Left ventricular diastolic parameters are  consistent with Grade I diastolic  dysfunction (impaired relaxation).   2. Right ventricular systolic function is normal. The right ventricular  size is normal. Tricuspid regurgitation signal is inadequate for assessing  PA pressure.   3. The mitral valve is normal in structure. Trivial mitral valve  regurgitation. No evidence of mitral stenosis.   4. The aortic valve is tricuspid. Aortic valve regurgitation is trivial.  No aortic stenosis is present.   NM Stress Testing : Date: 07/22/20 Results: Nuclear stress EF: 58%. The left ventricular ejection fraction  is normal (55-65%). There was no ST segment deviation noted during stress. This is a low risk study. There is no evidence of ischemia and no evidence of previous infarction. The study is normal.    Recent Labs: 06/12/2020: NT-Pro BNP 16 10/13/2020: ALT 19; B Natriuretic Peptide 11.4; BUN 10; Creatinine, Ser 0.73; Hemoglobin 14.5; Platelets 390; Potassium 4.0; Sodium 145  Recent Lipid Panel    Component Value Date/Time   CHOL 178 10/30/2018 0930   TRIG 103 10/30/2018 0930   HDL 43 10/30/2018 0930   CHOLHDL 7.5 02/08/2015 0422   VLDL 70 (  H) 02/08/2015 0422   LDLCALC 116 (H) 10/30/2018 0930   Risk Assessment/Calculations:     The 10-year ASCVD risk score (Arnett DK, et al., 2019) is: 32.6%   Values used to calculate the score:     Age: 79 years     Sex: Female     Is Non-Hispanic African American: Yes     Diabetic: Yes     Tobacco smoker: Yes     Systolic Blood Pressure: 240 mmHg     Is BP treated: Yes     HDL Cholesterol: 43 mg/dL     Total Cholesterol: 178 mg/dL   Physical Exam:    VS:  BP (!) 146/78   Pulse (!) 101   Ht 5\' 5"  (1.651 m)   Wt (!) 330 lb 9.6 oz (150 kg)   LMP 05/29/2017 (Approximate)   SpO2 91%   BMI 55.01 kg/m     Wt Readings from Last 3 Encounters:  11/07/20 (!) 330 lb 9.6 oz (150 kg)  09/08/20 (!) 330 lb (149.7 kg)  07/21/20 (!) 326 lb (147.9 kg)    GEN: Obesity well developed in no acute distress, smells of smoke HEENT: Normal NECK: No JVD LYMPHATICS: No lymphadenopathy CARDIAC: RRR, no murmurs, rubs, gallops; distant heart sounds RESPIRATORY:  Clear to auscultation without rales, wheezing or rhonchi  ABDOMEN: Soft, non-tender, non-distended MUSCULOSKELETAL:  +1 edema with allodynia- slight palpations to assess pitting edema leads to significant pain; No deformity  SKIN: Warm and dry NEUROLOGIC:  Alert and oriented x 3 PSYCHIATRIC:  Normal affect   ASSESSMENT:    1. DOE (dyspnea on exertion)   2. Obesity, diabetes, and hypertension  syndrome ([place])   3. OSA on CPAP   4. Obesity hypoventilation syndrome ([place])   5. Tobacco use   6. Hyperlipidemia associated with type 2 diabetes mellitus (HCC)   7. Elevated TSH     PLAN:    In order of problems listed above:  DOE  Non-cardiac CP Morbid Obesity HTN with DM Tobacco Abuse with seizure history HLD with DM OSA on CPAP and OHS Potential Thyroid issues with mood changes and fatigue - ambulatory blood pressure not done- we are working to assist her with a cuff  - continue norvasc 10 mg PO Daily, lisinopril 40 mg (patient takes it 20 mg PO BID) - continue atorvastatin 10 mg PO Daily lipids in 7-10 days - will change HCTZ to lasix 40 mg PO Daily- Bmp and Mg in 7-10 days - will check TSH, Free T3 and T4 - discussed smoking cessation - given allodynia with touching legs ABI and Duplex may not be best tes at this time  Will plan for 4 months follow up unless new symptoms or abnormal test results warranting change in plan  Would be reasonable for  APP Follow up    Medication Adjustments/Labs and Tests Ordered: Current medicines are reviewed at length with the patient today.  Concerns regarding medicines are outlined above.  Orders Placed This Encounter  Procedures   Basic metabolic panel   Magnesium   TSH   T3, free   T4, free   Lipid panel    Meds ordered this encounter  Medications   furosemide (LASIX) 40 MG tablet    Sig: Take 1 tablet (40 mg total) by mouth daily.    Dispense:  90 tablet    Refill:  3     Patient Instructions  Medication Instructions:  Your physician has recommended you make the following change  in your medication:  STOP: HCTZ START: furosemide (Lasix) 40 mg by mouth daily  *If you need a refill on your cardiac medications before your next appointment, please call your pharmacy*   Lab Work: IN 7- 10 DAYS: BMP, Mg, TSH, Free T3, T4, Fasting lipid panel (nothing to eat or drink 8-12 hours prior) If you have labs (blood work)  drawn today and your tests are completely normal, you will receive your results only by: [place] (if you have MyChart) OR A paper copy in the mail If you have any lab test that is abnormal or we need to change your treatment, we will call you to review the results.   Testing/Procedures: NONE   Follow-Up: At [facility], you and your health needs are our priority.  As part of our continuing mission to provide you with exceptional heart care, we have created designated Provider Care Teams.  These Care Teams include your primary Cardiologist (physician) and Advanced Practice Providers (APPs -  Physician Assistants and Nurse Practitioners) who all work together to provide you with the care you need, when you need it.  We recommend signing up for the patient portal called "MyChart".  Sign up information is provided on this After Visit Summary.  MyChart is used to connect with patients for Virtual Visits (Telemedicine).  Patients are able to view lab/test results, encounter notes, upcoming appointments, etc.  Non-urgent messages can be sent to your provider as well.   To learn more about what you can do with MyChart, go to [email or web address].    Your next appointment:   4 month(s)  The format for your next appointment:   In Person  Provider:   You may see Rudean Haskell, MD or one of the following Advanced Practice Providers on your designated Care Team:   Melina Copa, PA-C Ermalinda Barrios, PA-C    Signed, Werner Lean, MD  11/07/2020 9:23 AM    [place]

## 2020-11-07 ENCOUNTER — Ambulatory Visit (INDEPENDENT_AMBULATORY_CARE_PROVIDER_SITE_OTHER): Payer: Medicaid Other | Admitting: Internal Medicine

## 2020-11-07 ENCOUNTER — Other Ambulatory Visit: Payer: Self-pay

## 2020-11-07 ENCOUNTER — Encounter: Payer: Self-pay | Admitting: Internal Medicine

## 2020-11-07 VITALS — BP 146/78 | HR 101 | Ht 65.0 in | Wt 330.6 lb

## 2020-11-07 DIAGNOSIS — E1169 Type 2 diabetes mellitus with other specified complication: Secondary | ICD-10-CM

## 2020-11-07 DIAGNOSIS — E1159 Type 2 diabetes mellitus with other circulatory complications: Secondary | ICD-10-CM

## 2020-11-07 DIAGNOSIS — G4733 Obstructive sleep apnea (adult) (pediatric): Secondary | ICD-10-CM | POA: Diagnosis not present

## 2020-11-07 DIAGNOSIS — E662 Morbid (severe) obesity with alveolar hypoventilation: Secondary | ICD-10-CM | POA: Diagnosis not present

## 2020-11-07 DIAGNOSIS — E669 Obesity, unspecified: Secondary | ICD-10-CM

## 2020-11-07 DIAGNOSIS — Z720 Tobacco use: Secondary | ICD-10-CM

## 2020-11-07 DIAGNOSIS — R0609 Other forms of dyspnea: Secondary | ICD-10-CM | POA: Diagnosis not present

## 2020-11-07 DIAGNOSIS — I152 Hypertension secondary to endocrine disorders: Secondary | ICD-10-CM

## 2020-11-07 DIAGNOSIS — Z9989 Dependence on other enabling machines and devices: Secondary | ICD-10-CM

## 2020-11-07 DIAGNOSIS — R7989 Other specified abnormal findings of blood chemistry: Secondary | ICD-10-CM

## 2020-11-07 DIAGNOSIS — E785 Hyperlipidemia, unspecified: Secondary | ICD-10-CM

## 2020-11-07 HISTORY — DX: Type 2 diabetes mellitus with other specified complication: E11.69

## 2020-11-07 MED ORDER — FUROSEMIDE 40 MG PO TABS: 40.0000 mg | ORAL_TABLET | Freq: Every day | ORAL | 3 refills | Status: DC

## 2020-11-07 NOTE — Patient Instructions (Addendum)
Medication Instructions:  Your physician has recommended you make the following change in your medication:  STOP: HCTZ START: furosemide (Lasix) 40 mg by mouth daily  *If you need a refill on your cardiac medications before your next appointment, please call your pharmacy*   Lab Work: IN 7- 10 DAYS: BMP, Mg, TSH, Free T3, T4, Fasting lipid panel (nothing to eat or drink 8-12 hours prior) If you have labs (blood work) drawn today and your tests are completely normal, you will receive your results only by: [place] (if you have MyChart) OR A paper copy in the mail If you have any lab test that is abnormal or we need to change your treatment, we will call you to review the results.   Testing/Procedures: We have given you a BP cuff please check ambulatory BP readings.    Follow-Up: At [facility], you and your health needs are our priority.  As part of our continuing mission to provide you with exceptional heart care, we have created designated Provider Care Teams.  These Care Teams include your primary Cardiologist (physician) and Advanced Practice Providers (APPs -  Physician Assistants and Nurse Practitioners) who all work together to provide you with the care you need, when you need it.  We recommend signing up for the patient portal called "MyChart".  Sign up information is provided on this After Visit Summary.  MyChart is used to connect with patients for Virtual Visits (Telemedicine).  Patients are able to view lab/test results, encounter notes, upcoming appointments, etc.  Non-urgent messages can be sent to your provider as well.   To learn more about what you can do with MyChart, go to [email or web address].    Your next appointment:   4 month(s)  The format for your next appointment:   In Person  Provider:   You may see Rudean Haskell, MD or one of the following Advanced Practice Providers on your designated Care Team:   Melina Copa, PA-C Ermalinda Barrios,  PA-C

## 2020-11-17 ENCOUNTER — Other Ambulatory Visit: Payer: Medicaid Other | Admitting: *Deleted

## 2020-11-17 ENCOUNTER — Other Ambulatory Visit: Payer: Self-pay

## 2020-11-17 DIAGNOSIS — E785 Hyperlipidemia, unspecified: Secondary | ICD-10-CM

## 2020-11-17 DIAGNOSIS — E1169 Type 2 diabetes mellitus with other specified complication: Secondary | ICD-10-CM

## 2020-11-17 DIAGNOSIS — R7989 Other specified abnormal findings of blood chemistry: Secondary | ICD-10-CM

## 2020-11-17 DIAGNOSIS — R0609 Other forms of dyspnea: Secondary | ICD-10-CM

## 2020-11-17 LAB — BASIC METABOLIC PANEL
BUN/Creatinine Ratio: 9 (ref 9–23)
BUN: 7 mg/dL (ref 6–24)
CO2: 24 mmol/L (ref 20–29)
Calcium: 9.8 mg/dL (ref 8.7–10.2)
Chloride: 100 mmol/L (ref 96–106)
Creatinine, Ser: 0.78 mg/dL (ref 0.57–1.00)
Glucose: 108 mg/dL — ABNORMAL HIGH (ref 70–99)
Potassium: 4.3 mmol/L (ref 3.5–5.2)
Sodium: 141 mmol/L (ref 134–144)
eGFR: 91 mL/min/{1.73_m2} (ref >59)

## 2020-11-17 LAB — LIPID PANEL
Chol/HDL Ratio: 3.8 ratio (ref 0.0–4.4)
Cholesterol, Total: 184 mg/dL (ref 100–199)
HDL: 48 mg/dL (ref >39)
LDL Chol Calc (NIH): 114 mg/dL — ABNORMAL HIGH (ref 0–99)
Triglycerides: 121 mg/dL (ref 0–149)
VLDL Cholesterol Cal: 22 mg/dL (ref 5–40)

## 2020-11-17 LAB — T4, FREE: Free T4: 1.05 ng/dL (ref 0.82–1.77)

## 2020-11-17 LAB — MAGNESIUM: Magnesium: 1.9 mg/dL (ref 1.6–2.3)

## 2020-11-17 LAB — T3, FREE: T3, Free: 2.6 pg/mL (ref 2.0–4.4)

## 2020-11-17 LAB — TSH: TSH: 1.46 u[IU]/mL (ref 0.450–4.500)

## 2020-11-20 ENCOUNTER — Telehealth: Payer: Self-pay

## 2020-11-20 DIAGNOSIS — E785 Hyperlipidemia, unspecified: Secondary | ICD-10-CM

## 2020-11-20 DIAGNOSIS — E1169 Type 2 diabetes mellitus with other specified complication: Secondary | ICD-10-CM

## 2020-11-20 MED ORDER — ATORVASTATIN CALCIUM 20 MG PO TABS: 20.0000 mg | ORAL_TABLET | Freq: Every day | ORAL | 3 refills | Status: DC

## 2020-11-20 NOTE — Telephone Encounter (Signed)
Called pt reviewed results and MD recommendations.  She is agreeable to plan of care.  F/u labs to be scheduled for 01/26/20 pt informed to fast 8-12 hours prior to appointment.  Pt is requesting that I call her within 1 month of lab appointment so that she can set up transportation.  I have sent myself a reminder message.

## 2020-11-20 NOTE — Telephone Encounter (Signed)
-----   Message from Werner Lean, MD sent at 11/17/2020  5:54 PM EDT ----- Results: BMP, MG, Thyroid studies WNL LDL above goal Plan: Atorvastatin 20 and labs in three months  Werner Lean, MD

## 2020-11-20 NOTE — Addendum Note (Signed)
Addended by: Precious Gilding on: 11/20/2020 09:38 AM   Modules accepted: Orders

## 2020-11-27 ENCOUNTER — Other Ambulatory Visit: Payer: Self-pay

## 2020-11-27 ENCOUNTER — Telehealth (HOSPITAL_BASED_OUTPATIENT_CLINIC_OR_DEPARTMENT_OTHER): Payer: Medicaid Other | Admitting: Psychiatry

## 2020-11-27 DIAGNOSIS — F411 Generalized anxiety disorder: Secondary | ICD-10-CM | POA: Diagnosis not present

## 2020-11-27 DIAGNOSIS — G2589 Other specified extrapyramidal and movement disorders: Secondary | ICD-10-CM | POA: Diagnosis not present

## 2020-11-27 DIAGNOSIS — F333 Major depressive disorder, recurrent, severe with psychotic symptoms: Secondary | ICD-10-CM | POA: Diagnosis not present

## 2020-11-27 DIAGNOSIS — F4312 Post-traumatic stress disorder, chronic: Secondary | ICD-10-CM

## 2020-11-27 MED ORDER — BENZTROPINE MESYLATE 1 MG PO TABS: 1.0000 mg | ORAL_TABLET | Freq: Two times a day (BID) | ORAL | 0 refills | Status: DC

## 2020-11-27 MED ORDER — BUSPIRONE HCL 10 MG PO TABS: 20.0000 mg | ORAL_TABLET | Freq: Two times a day (BID) | ORAL | 0 refills | Status: DC

## 2020-11-27 MED ORDER — BREXPIPRAZOLE 0.25 MG PO TABS: 0.7500 mg | ORAL_TABLET | Freq: Every day | ORAL | 1 refills | Status: DC

## 2020-11-27 MED ORDER — NORTRIPTYLINE HCL 75 MG PO CAPS
150.0000 mg | ORAL_CAPSULE | Freq: Every day | ORAL | 0 refills | Status: DC
Start: 2020-11-27 — End: 2021-01-08

## 2020-11-27 NOTE — Progress Notes (Signed)
Virtual Visit via Telephone Note  I connected with Denise [name] on 11/27/20 at 10:00 AM EDT by telephone and verified that I am speaking with the correct person using two identifiers.We attempted to connect by video but were unable to do so.   Location: Patient: home Provider: office   I discussed the limitations, risks, security and privacy concerns of performing an evaluation and management service by telephone and the availability of in person appointments. I also discussed with the patient that there may be a patient responsible charge related to this service. The patient expressed understanding and agreed to proceed.   History of Present Illness: Denise [name] shares she is in more pain than usual for unknown reasons. Her depression is ongoing but she is not sure if it much worse. She does not know when the depression changed. Denise [name] is sleeping ok but her energy and motivation are low. She only naps for 30 min/day. She spends most of her waking time sitting. Her concentration is poor. Her appetite is variable. She is feeling uncertain and indecisive. She is reporting ongoing negative self thoughts. She denies SI/HI and passive thoughts of death. Denise [name] states she doesn't pay much attention to herself and it is hard to share how she is feeling. Her mind feels blank and she has no thoughts about things. She is not sure if she anxious.    Observations/Objective:  General Appearance: unable to assess  Eye Contact:  unable to assess  Speech:  Clear and Coherent and Normal Rate  Volume:  Normal  Mood:  Depressed  Affect:  Congruent  Thought Process:  slow, thought blocking Descriptions of Associations: Tangential  Orientation:  Negative  Thought Content:  WDL  Suicidal Thoughts:  No  Homicidal Thoughts:  No  Memory:  Immediate;   Poor  Judgement:  Fair  Insight:  Shallow  Psychomotor Activity: unable to assess  Concentration:  Concentration: Poor  Recall:  Poor  Fund of  Knowledge:  Poor  Language:  Fair  Akathisia:  unable to assess  Handed:  unable to assess  AIMS (if indicated):     Assets:  Communication Skills Desire for Improvement Financial Resources/Insurance Housing Intimacy  ADL's:  unable to assess  Cognition:  WNL  Sleep:        Assessment and Plan: Depression screen [facility] 2/9 11/27/2020 08/28/2020 06/05/2020 04/24/2020 07/15/2016  Decreased Interest 3 0 2 3 3   Down, Depressed, Hopeless 3 1 2 3 3   PHQ - 2 Score 6 1 4 6 6   Altered sleeping 0 - 3 3 3   Tired, decreased energy 3 - 3 3 3   Change in appetite 2 - 0 3 1  Feeling bad or failure about yourself  3 - 2 3 1   Trouble concentrating 3 - 3 3 3   Moving slowly or fidgety/restless 0 - 0 0 2  Suicidal thoughts 0 - 0 0 0  PHQ-9 Score 17 - 15 21 19   Difficult doing work/chores Extremely dIfficult - Very difficult Extremely dIfficult -    Flowsheet Row Video Visit from 11/27/2020 in [facility] from 10/13/2020 in [facility] Video Visit from 08/28/2020 in [facility]  C-SSRS RISK CATEGORY No Risk No Risk Error: Question 1 not populated       1. Combined pyramidal-extrapyramidal syndrome - benztropine (COGENTIN) 1 MG tablet; Take 1 tablet (1 mg total) by mouth 2 (two) times daily.  Dispense: 180 tablet; Refill: 0  2. Severe  episode of recurrent major depressive disorder, with psychotic features ([place]) - busPIRone (BUSPAR) 10 MG tablet; Take 2 tablets (20 mg total) by mouth 2 (two) times daily.  Dispense: 360 tablet; Refill: 0 - nortriptyline (PAMELOR) 75 MG capsule; Take 2 capsules (150 mg total) by mouth at bedtime.  Dispense: 180 capsule; Refill: 0  3. Chronic post-traumatic stress disorder (PTSD) - busPIRone (BUSPAR) 10 MG tablet; Take 2 tablets (20 mg total) by mouth 2 (two) times daily.  Dispense: 360 tablet; Refill: 0  4. GAD (generalized anxiety disorder) - busPIRone  (BUSPAR) 10 MG tablet; Take 2 tablets (20 mg total) by mouth 2 (two) times daily.  Dispense: 360 tablet; Refill: 0 - nortriptyline (PAMELOR) 75 MG capsule; Take 2 capsules (150 mg total) by mouth at bedtime.  Dispense: 180 capsule; Refill: 0  -increase [name] to target mood  Declined therapy referral  She has no transportation and it is a barrier to treatment.   She has been working with her PCP and her last appointment was last week.  Follow Up Instructions: In 1-2 months or sooner if needed   I discussed the assessment and treatment plan with the patient. The patient was provided an opportunity to ask questions and all were answered. The patient agreed with the plan and demonstrated an understanding of the instructions.   The patient was advised to call back or seek an in-person evaluation if the symptoms worsen or if the condition fails to improve as anticipated.  I provided 18 minutes of non-face-to-face time during this encounter.   Charlcie Cradle, MD

## 2020-12-08 ENCOUNTER — Ambulatory Visit: Payer: Medicaid Other | Admitting: Podiatry

## 2020-12-15 ENCOUNTER — Other Ambulatory Visit: Payer: Self-pay | Admitting: Podiatry

## 2020-12-15 ENCOUNTER — Ambulatory Visit (INDEPENDENT_AMBULATORY_CARE_PROVIDER_SITE_OTHER): Payer: Medicaid Other

## 2020-12-15 ENCOUNTER — Other Ambulatory Visit: Payer: Self-pay

## 2020-12-15 ENCOUNTER — Encounter: Payer: Self-pay | Admitting: Podiatry

## 2020-12-15 ENCOUNTER — Ambulatory Visit: Payer: Medicaid Other | Admitting: Podiatry

## 2020-12-15 DIAGNOSIS — E1141 Type 2 diabetes mellitus with diabetic mononeuropathy: Secondary | ICD-10-CM

## 2020-12-15 DIAGNOSIS — M7752 Other enthesopathy of left foot: Secondary | ICD-10-CM | POA: Diagnosis not present

## 2020-12-15 DIAGNOSIS — M7751 Other enthesopathy of right foot: Secondary | ICD-10-CM

## 2020-12-15 DIAGNOSIS — M7750 Other enthesopathy of unspecified foot: Secondary | ICD-10-CM

## 2020-12-15 DIAGNOSIS — R600 Localized edema: Secondary | ICD-10-CM | POA: Diagnosis not present

## 2020-12-15 DIAGNOSIS — M792 Neuralgia and neuritis, unspecified: Secondary | ICD-10-CM

## 2020-12-15 NOTE — Progress Notes (Signed)
  Subjective:  Patient ID: Denise [name], female    DOB: 10-Apr-1970,   MRN: [id number]  Chief Complaint  Patient presents with   Foot Pain    Both of the ankles are swelling and achy and are touchy and hurts to walk and I am a diabetic and there is not any injury at all    50 y.o. female presents for concern of swelling and tenderness to both ankles. Relates tingling and burning. States she has not had any injury. States the swelling started some months ago but cannot recall exactly when. Relates she is diabetic and last A1c was 5.4 a year ago. Relates she does have history of back pain.  . Denies any other pedal complaints. Denies n/v/f/c.   Past Medical History:  Diagnosis Date   Abnormal EKG    AD (atopic dermatitis)    Alcohol use 04/14/2015   Anxiety    Arthritis    Blood transfusion without reported diagnosis    Depression    Diabetes mellitus without complication ([place])    Fibroid uterus 12/18/2014   Hyperlipemia    Hypertension    Memory changes    Overactive bladder    Pelvic pain in female 12/18/2014   Prediabetes    Right sided weakness    Seizures ([place])    Stroke ([place])    Substance abuse ([place])    Syncope 02/07/2015   TIA (transient ischemic attack)    Tobacco use 04/14/2015    Objective:  Physical Exam: Vascular: DP/PT pulses 2/4 bilateral. CFT <3 seconds. Normal hair growth on digits. Edema noted to bilateral lower extremities.  Skin. No lacerations or abrasions bilateral feet. Nails 1-5 are thickened discolored  with subungual debris.  Musculoskeletal: MMT 5/5 bilateral lower extremities in DF, PF, Inversion and Eversion. Deceased ROM in DF of ankle joint. Tender circumferentially around ankle and foot.  Neurological: Sensation intact to light touch.   Assessment:   1. 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 Localized edema      Plan:  Patient was evaluated and treated and all questions answered. X-rays reviewed and discussed with patient. No acute  fractures or dislocations noted.  Discussed neuropathy and etiology as well as treatment with patient. Discussed tingling pain could be coming from back, swelling or diabetes. .  -Discussed and educated patient on diabetic foot care, especially with regards to the vascular, neurological and musculoskeletal systems.  -Stressed the importance of good glycemic control and the detriment of not  controlling glucose levels in relation to the foot. -Discussed supportive shoes at all times and checking feet regularly.  -Follow-up with PCP for prescription management of gabapentin and to discuss back pain.  -Recommend compression stockings.  -Patient to as needed. Will follow-up with Dr. Prudence Davidson for regular diabetic foot care.     Lorenda Peck, DPM

## 2020-12-22 ENCOUNTER — Ambulatory Visit: Payer: Medicaid Other | Admitting: Pulmonary Disease

## 2021-01-08 ENCOUNTER — Telehealth (HOSPITAL_BASED_OUTPATIENT_CLINIC_OR_DEPARTMENT_OTHER): Payer: Medicaid Other | Admitting: Psychiatry

## 2021-01-08 ENCOUNTER — Other Ambulatory Visit: Payer: Self-pay

## 2021-01-08 DIAGNOSIS — G2589 Other specified extrapyramidal and movement disorders: Secondary | ICD-10-CM

## 2021-01-08 DIAGNOSIS — F333 Major depressive disorder, recurrent, severe with psychotic symptoms: Secondary | ICD-10-CM

## 2021-01-08 DIAGNOSIS — F4312 Post-traumatic stress disorder, chronic: Secondary | ICD-10-CM | POA: Diagnosis not present

## 2021-01-08 DIAGNOSIS — F411 Generalized anxiety disorder: Secondary | ICD-10-CM

## 2021-01-08 MED ORDER — NORTRIPTYLINE HCL 75 MG PO CAPS: 150.0000 mg | ORAL_CAPSULE | Freq: Every day | ORAL | 0 refills | Status: DC

## 2021-01-08 MED ORDER — BUSPIRONE HCL 30 MG PO TABS
30.0000 mg | ORAL_TABLET | Freq: Two times a day (BID) | ORAL | 0 refills | Status: DC
Start: 2021-01-08 — End: 2021-02-26

## 2021-01-08 MED ORDER — BREXPIPRAZOLE 0.25 MG PO TABS: 0.7500 mg | ORAL_TABLET | Freq: Every day | ORAL | 1 refills | Status: DC

## 2021-01-08 MED ORDER — BENZTROPINE MESYLATE 1 MG PO TABS: 1.0000 mg | ORAL_TABLET | Freq: Two times a day (BID) | ORAL | 0 refills | Status: DC

## 2021-01-08 NOTE — Progress Notes (Signed)
Virtual Visit via Telephone Note  I connected with Denise [name] on 01/08/21 at  9:30 AM EST by telephone and verified that I am speaking with the correct person using two identifiers.  Location: Patient: home Provider: office   I discussed the limitations, risks, security and privacy concerns of performing an evaluation and management service by telephone and the availability of in person appointments. I also discussed with the patient that there may be a patient responsible charge related to this service. The patient expressed understanding and agreed to proceed.   History of Present Illness: Denise [name] shares that her anxiety has been very high lately. She is irritable and her stress tolerance is low. Her depression is worse and she is depressed about half the week. On those days she will isolate herself and is withdrawn. She will spend her time in bed. Her sleep is poor and her energy is low. Denise [name]'s appetite is poor and her concentration remains poor. She has a lot of negative self thought. She denies passive thoughts of death. She denies SI/HI. Her PTSD is a little better. Denise [name] reports a decrease in intensity and frequency of intrusive memories. She denies HV and feels safe when her husband is around. She tolerated the increase in [name] well and denies SE including shaking.    Observations/Objective:  General Appearance: unable to assess  Eye Contact:  unable to assess  Speech:  Clear and Coherent and Slow  Volume:  Normal  Mood:  Anxious and Depressed  Affect:  Congruent  Thought Process:  Coherent and Descriptions of Associations: Intact  Orientation:  Full (Time, Place, and Person)  Thought Content:  Rumination  Suicidal Thoughts:  No  Homicidal Thoughts:  No  Memory:  Immediate;   Good  Judgement:  Fair  Insight:  Present  Psychomotor Activity: unable to assess  Concentration:  Concentration: Fair  Recall:  Poor  Fund of Knowledge:  Fair  Language:  Fair   Akathisia:  unable to assess  Handed:  unable to assess  AIMS (if indicated):     Assets:  Communication Skills Desire for Improvement Financial Resources/Insurance Housing Intimacy Leisure Time Social Support Talents/Skills Vocational/Educational  ADL's:  unable to assess  Cognition:  WNL  Sleep:        Assessment and Plan: Depression screen [facility] 2/9 01/08/2021 11/27/2020 08/28/2020 06/05/2020 04/24/2020  Decreased Interest 2 3 0 2 3  Down, Depressed, Hopeless 2 3 1 2 3   PHQ - 2 Score 4 6 1 4 6   Altered sleeping 3 0 - 3 3  Tired, decreased energy 3 3 - 3 3  Change in appetite 3 2 - 0 3  Feeling bad or failure about yourself  3 3 - 2 3  Trouble concentrating 3 3 - 3 3  Moving slowly or fidgety/restless 3 0 - 0 0  Suicidal thoughts 0 0 - 0 0  PHQ-9 Score 22 17 - 15 21  Difficult doing work/chores Very difficult Extremely dIfficult - Very difficult Extremely dIfficult    Flowsheet Row Video Visit from 01/08/2021 in [facility] Video Visit from 11/27/2020 in [facility] ED from 10/13/2020 in [facility]  C-SSRS RISK CATEGORY No Risk No Risk No Risk      - increase Buspar to target mood and anxiety symptoms.  1. Combined pyramidal-extrapyramidal syndrome - benztropine (COGENTIN) 1 MG tablet; Take 1 tablet (1 mg total) by mouth 2 (two) times daily.  Dispense: 180 tablet;  Refill: 0  2. Severe episode of recurrent major depressive disorder, with psychotic features ([place]) - busPIRone (BUSPAR) 30 MG tablet; Take 1 tablet (30 mg total) by mouth 2 (two) times daily.  Dispense: 180 tablet; Refill: 0 - nortriptyline (PAMELOR) 75 MG capsule; Take 2 capsules (150 mg total) by mouth at bedtime.  Dispense: 180 capsule; Refill: 0  3. Chronic post-traumatic stress disorder (PTSD) - busPIRone (BUSPAR) 30 MG tablet; Take 1 tablet (30 mg total) by mouth 2 (two) times daily.  Dispense:  180 tablet; Refill: 0  4. GAD (generalized anxiety disorder) - busPIRone (BUSPAR) 30 MG tablet; Take 1 tablet (30 mg total) by mouth 2 (two) times daily.  Dispense: 180 tablet; Refill: 0 - nortriptyline (PAMELOR) 75 MG capsule; Take 2 capsules (150 mg total) by mouth at bedtime.  Dispense: 180 capsule; Refill: 0   Follow Up Instructions: In 2-3 months in the office for a face to face visit (or sooner if needed)   I discussed the assessment and treatment plan with the patient. The patient was provided an opportunity to ask questions and all were answered. The patient agreed with the plan and demonstrated an understanding of the instructions.   The patient was advised to call back or seek an in-person evaluation if the symptoms worsen or if the condition fails to improve as anticipated.  I provided 13 minutes of non-face-to-face time during this encounter.   Charlcie Cradle, MD

## 2021-01-28 ENCOUNTER — Other Ambulatory Visit: Payer: Self-pay | Admitting: Pulmonary Disease

## 2021-02-01 ENCOUNTER — Emergency Department (HOSPITAL_BASED_OUTPATIENT_CLINIC_OR_DEPARTMENT_OTHER): Payer: Medicaid Other

## 2021-02-01 ENCOUNTER — Encounter (HOSPITAL_COMMUNITY): Payer: Self-pay

## 2021-02-01 ENCOUNTER — Emergency Department (HOSPITAL_COMMUNITY): Payer: Medicaid Other

## 2021-02-01 ENCOUNTER — Emergency Department (HOSPITAL_COMMUNITY)
Admission: EM | Admit: 2021-02-01 | Discharge: 2021-02-01 | Disposition: A | Discharge status: Home / Self Care | Payer: Medicaid Other | Attending: Student | Admitting: Student

## 2021-02-01 DIAGNOSIS — M7989 Other specified soft tissue disorders: Secondary | ICD-10-CM | POA: Diagnosis not present

## 2021-02-01 DIAGNOSIS — M545 Low back pain, unspecified: Secondary | ICD-10-CM | POA: Insufficient documentation

## 2021-02-01 DIAGNOSIS — M79604 Pain in right leg: Secondary | ICD-10-CM

## 2021-02-01 DIAGNOSIS — W010XXA Fall on same level from slipping, tripping and stumbling without subsequent striking against object, initial encounter: Secondary | ICD-10-CM | POA: Insufficient documentation

## 2021-02-01 DIAGNOSIS — M79605 Pain in left leg: Secondary | ICD-10-CM

## 2021-02-01 DIAGNOSIS — R0602 Shortness of breath: Secondary | ICD-10-CM | POA: Insufficient documentation

## 2021-02-01 LAB — CBC WITH DIFFERENTIAL/PLATELET
Abs Immature Granulocytes: 0.04 10*3/uL (ref 0.00–0.07)
Basophils Absolute: 0 10*3/uL (ref 0.0–0.1)
Basophils Relative: 0 %
Eosinophils Absolute: 0.2 10*3/uL (ref 0.0–0.5)
Eosinophils Relative: 2 %
HCT: 42.5 % (ref 36.0–46.0)
Hemoglobin: 13.4 g/dL (ref 12.0–15.0)
Immature Granulocytes: 0 %
Lymphocytes Relative: 29 %
Lymphs Abs: 2.9 10*3/uL (ref 0.7–4.0)
MCH: 28.5 pg (ref 26.0–34.0)
MCHC: 31.5 g/dL (ref 30.0–36.0)
MCV: 90.4 fL (ref 80.0–100.0)
Monocytes Absolute: 0.6 10*3/uL (ref 0.1–1.0)
Monocytes Relative: 6 %
Neutro Abs: 6.2 10*3/uL (ref 1.7–7.7)
Neutrophils Relative %: 63 %
Platelets: 365 10*3/uL (ref 150–400)
RBC: 4.7 MIL/uL (ref 3.87–5.11)
RDW: 16.1 % — ABNORMAL HIGH (ref 11.5–15.5)
WBC: 10 10*3/uL (ref 4.0–10.5)
nRBC: 0 % (ref 0.0–0.2)

## 2021-02-01 LAB — COMPREHENSIVE METABOLIC PANEL
ALT: 26 U/L (ref 0–44)
AST: 23 U/L (ref 15–41)
Albumin: 3.9 g/dL (ref 3.5–5.0)
Alkaline Phosphatase: 94 U/L (ref 38–126)
Anion gap: 8 (ref 5–15)
BUN: 7 mg/dL (ref 6–20)
CO2: 29 mmol/L (ref 22–32)
Calcium: 9.5 mg/dL (ref 8.9–10.3)
Chloride: 103 mmol/L (ref 98–111)
Creatinine, Ser: 0.75 mg/dL (ref 0.44–1.00)
GFR, Estimated: >60 mL/min (ref >60)
Glucose, Bld: 95 mg/dL (ref 70–99)
Potassium: 3.7 mmol/L (ref 3.5–5.1)
Sodium: 140 mmol/L (ref 135–145)
Total Bilirubin: 0.6 mg/dL (ref 0.3–1.2)
Total Protein: 7.1 g/dL (ref 6.5–8.1)

## 2021-02-01 LAB — URINALYSIS, ROUTINE W REFLEX MICROSCOPIC
Bilirubin Urine: NEGATIVE
Glucose, UA: NEGATIVE mg/dL
Hgb urine dipstick: NEGATIVE
Ketones, ur: 5 mg/dL — AB
Leukocytes,Ua: NEGATIVE
Nitrite: NEGATIVE
Protein, ur: NEGATIVE mg/dL
Specific Gravity, Urine: 1.02 (ref 1.005–1.030)
pH: 6 (ref 5.0–8.0)

## 2021-02-01 LAB — BRAIN NATRIURETIC PEPTIDE: B Natriuretic Peptide: 14.9 pg/mL (ref 0.0–100.0)

## 2021-02-01 MED ORDER — MELOXICAM 15 MG PO TABS: 15.0000 mg | ORAL_TABLET | ORAL | 0 refills | Status: DC | PRN

## 2021-02-01 MED ORDER — MORPHINE SULFATE (PF) 4 MG/ML IV SOLN
4.0000 mg | Freq: Once | INTRAVENOUS | Status: AC
  Administered 2021-02-01: 4 mg via INTRAVENOUS
  Filled 2021-02-01: qty 1

## 2021-02-01 NOTE — ED Provider Notes (Signed)
[facility] DEPT Provider Note   CSN: [id number] Arrival date & time: 02/01/21  0909     History  Chief Complaint  Patient presents with   Leg Pain    Denise [name] is a 51 y.o. female presenting for evaluation of leg pain, back pain, shortness of breath.  Patient states for about 3 weeks she has had bilateral leg pain which feels like a tight squeezing pain that goes down to her legs.  It is worse in her right leg.  She has associated low back pain, although this is not new for her.  She has been taking her gabapentin as prescribed without improvement of symptoms, has not taken anything else for pain.  No fall, trauma, or injury until last night, when she did have a fall in which she lost her balance and fell onto her buttocks.  No new trauma or pain from that.  She is concerned that she may have blood clots in her legs. Additionally, patient states she has had shortness of breath, worse with exertion. No cp, fever, cough.    HPI     Home Medications Prior to Admission medications   Medication Sig Start Date End Date Taking? Authorizing Provider  amLODipine (NORVASC) 10 MG tablet Take 10 mg by mouth daily.   Yes [provider]  atorvastatin (LIPITOR) 20 MG tablet Take 1 tablet (20 mg total) by mouth daily. 11/20/20  Yes Chandrasekhar, Mahesh A, MD  benztropine (COGENTIN) 1 MG tablet Take 1 tablet (1 mg total) by mouth 2 (two) times daily. 01/08/21 01/08/22 Yes Charlcie Cradle, MD  brexpiprazole ([name]) 0.25 MG TABS tablet Take 3 tablets (0.75 mg total) by mouth daily. 01/08/21  Yes Charlcie Cradle, MD  busPIRone (BUSPAR) 30 MG tablet Take 1 tablet (30 mg total) by mouth 2 (two) times daily. 01/08/21  Yes Charlcie Cradle, MD  FLOVENT [facility] 110 MCG/ACT inhaler inhale 2 PUFFS into THE lungs EVERY MORNING AND NIGHTLY AT BEDTIME 01/29/21  Yes Chesley Mires, MD  furosemide (LASIX) 40 MG tablet Take 1 tablet (40 mg total) by mouth daily.  11/07/20  Yes Chandrasekhar, Mahesh A, MD  gabapentin (NEURONTIN) 300 MG capsule Take 2 capsules in morning, 2 capsules in afternoon and 3 capsules at bedtime. 09/08/20  Yes Jaffe, Adam R, DO  lisinopril (PRINIVIL,ZESTRIL) 20 MG tablet Take 20 mg by mouth 2 (two) times daily.    Yes [provider]  metFORMIN (GLUCOPHAGE) 500 MG tablet Take 500 mg by mouth 2 (two) times daily. 05/25/17  Yes [provider]  methocarbamol (ROBAXIN) 750 MG tablet Take 750 mg by mouth 2 (two) times daily as needed. 05/21/20  Yes [provider]  nortriptyline (PAMELOR) 75 MG capsule Take 2 capsules (150 mg total) by mouth at bedtime. 01/08/21 01/08/22 Yes Charlcie Cradle, MD  Polyethylene Glycol 3350 (PEG 3350) 17 GM/SCOOP POWD SMARTSIG:17 Gram(s) By Mouth 1-2 Times Daily 11/17/20  Yes [provider]  PROAIR HFA 108 (90 Base) MCG/ACT inhaler inhale 2 PUFFS into THE lungs EVERY 6 HOURS AS NEEDED FOR WHEEZING OR SHORTNESS OF BREATH 07/14/20  Yes Chesley Mires, MD  hydrochlorothiazide (HYDRODIURIL) 25 MG tablet Take 25 mg by mouth daily. Patient not taking: Reported on 02/01/2021 11/17/20   [provider]  meloxicam (MOBIC) 15 MG tablet Take 1 tablet (15 mg total) by mouth as needed. 02/01/21   Dorell Gatlin, PA-C      Allergies    Gadolinium derivatives and Grapefruit flavor [flavoring agent]  Review of Systems   Review of Systems  Respiratory:  Positive for shortness of breath.   Cardiovascular:  Positive for leg swelling.  Musculoskeletal:  Positive for back pain and myalgias.  All other systems reviewed and are negative.  Physical Exam Updated Vital Signs BP (!) 153/86    Pulse 94    Temp 98.3 F (36.8 C) (Oral)    Resp 17    LMP 05/29/2017 (Approximate)    SpO2 97%  Physical Exam Vitals and nursing note reviewed.  Constitutional:      General: She is not in acute distress.    Appearance: Normal appearance. She is obese.     Comments: nontoxic  HENT:      Head: Normocephalic and atraumatic.  Eyes:     Extraocular Movements: Extraocular movements intact.     Conjunctiva/sclera: Conjunctivae normal.     Pupils: Pupils are equal, round, and reactive to light.  Cardiovascular:     Rate and Rhythm: Normal rate and regular rhythm.     Pulses: Normal pulses.  Pulmonary:     Effort: Pulmonary effort is normal. No respiratory distress.     Breath sounds: Normal breath sounds. No wheezing.     Comments: Speaking in full sentences.  Clear lung sounds in all fields. Abdominal:     General: There is no distension.     Palpations: Abdomen is soft. There is no mass.     Tenderness: There is no abdominal tenderness. There is no guarding or rebound.  Musculoskeletal:     Cervical back: Normal range of motion and neck supple.     Comments: Diffuse tenderness palpation of bilateral lower extremities, right more than left.  Right leg is slightly more swollen than the left leg.  Very minimal edema.  Pedal pulses 2+. Diffuse tenderness palpation of the low back bilaterally and over midline spine.  No step-offs or deformities.  Skin:    General: Skin is warm and dry.     Capillary Refill: Capillary refill takes less than 2 seconds.  Neurological:     Mental Status: She is alert and oriented to person, place, and time.  Psychiatric:        Mood and Affect: Mood and affect normal.        Speech: Speech normal.        Behavior: Behavior normal.    ED Results / Procedures / Treatments   Labs (all labs ordered are listed, but only abnormal results are displayed) Labs Reviewed  CBC WITH DIFFERENTIAL/PLATELET - Abnormal; Notable for the following components:      Result Value   RDW 16.1 (*)    All other components within normal limits  URINALYSIS, ROUTINE W REFLEX MICROSCOPIC - Abnormal; Notable for the following components:   Ketones, ur 5 (*)    All other components within normal limits  COMPREHENSIVE METABOLIC PANEL  BRAIN NATRIURETIC PEPTIDE     EKG None  Radiology DG Chest 2 View  Result Date: 02/01/2021 CLINICAL DATA:  Shortness of breath. EXAM: CHEST - 2 VIEW COMPARISON:  10/13/2020 FINDINGS: 1107 hours. The lungs are clear without focal pneumonia, edema, pneumothorax or pleural effusion. The cardiopericardial silhouette is within normal limits for size. The visualized bony structures of the thorax show no acute abnormality. IMPRESSION: No active cardiopulmonary disease. Electronically Signed   By: Misty Stanley M.D.   On: 02/01/2021 11:17   CT Lumbar Spine Wo Contrast  Result Date: 02/01/2021 CLINICAL DATA:  51 year old female with low back  pain, bilateral leg pain for 3 days. EXAM: CT LUMBAR SPINE WITHOUT CONTRAST TECHNIQUE: Multidetector CT imaging of the lumbar spine was performed without intravenous contrast administration. Multiplanar CT image reconstructions were also generated. COMPARISON:  Lumbar MRI 07/01/2020. FINDINGS: Segmentation: Normal, the same numbering system used on the previous MRI. Alignment: Stable lumbar lordosis. Subtle retrolisthesis of L5 on S1. Vertebrae: Normal background bone mineralization. No acute osseous abnormality identified. Intact visible sacrum and SI joints. Chronic T12 inferior endplate Schmorl's node. Paraspinal and other soft tissues: Aortoiliac calcified atherosclerosis. Abdominal aorta only partially visible but caliber seems to remain normal. Negative lumbar paraspinal soft tissues. Disc levels: T12-L1:  Negative. L1-L2:  Mild facet hypertrophy on the right. No stenosis. L2-L3: Moderate facet hypertrophy on the right with vacuum facet. Mild disc bulging. No significant stenosis. L3-L4: Mostly far lateral disc bulging. Bilateral vacuum facet with mild to moderate facet hypertrophy. No significant stenosis. L4-L5: Extensive bilateral vacuum facet. Moderate to severe facet hypertrophy. But only minor disc bulging. No convincing stenosis. L5-S1: Disc space loss with vacuum disc. Mild circumferential  disc bulge. Up to moderate facet hypertrophy and vacuum facet greater on the left. No spinal stenosis. Mild to moderate bilateral L5 neural foraminal stenosis appears stable since last year. IMPRESSION: 1. No acute osseous abnormality in the lumbar spine. 2. The dominant degenerative finding is multilevel advanced lumbar facet arthropathy, including vacuum facet most pronounced at L4-L5. There is superimposed advanced disc degeneration at L5-S1 with vacuum disc. But no spinal stenosis suspected. And mild to moderate bilateral L5 neural foraminal stenosis appears stable from an MRI last year. 3. Aortic Atherosclerosis (ICD10-I70.0). Electronically Signed   By: Genevie Ann M.D.   On: 02/01/2021 11:19    Procedures Procedures    Medications Ordered in ED Medications  morphine 4 MG/ML injection 4 mg (4 mg Intravenous Given 02/01/21 1122)    ED Course/ Medical Decision Making/ A&P                           Medical Decision Making   This patient presents to the ED for concern of leg pain, back pain, shortness of breath.  This involves a number of treatment options, and is a complaint that carries with it a moderate risk of complications and morbidity.  The differential diagnosis includes CHF, viral illness, bacterial illness, ACS, lumbar spinal trauma, sciatica/radiculopathy, neuropathy   Co morbidities:  DM with neuropathy, chronic back pain, OSA on CPAP, HTN   Additional history: Reviewed previous evaluation for dyspnea on exertion and back pain, the symptoms are likely more chronic than acute   Lab Tests:  I ordered, and personally interpreted labs.  The pertinent results include: Normal electrolytes.  Kidney function is normal.  BNP negative.  EKG nonischemic.   Imaging Studies:  I ordered imaging studies including CT lumbar spine due to recent trauma and chest x-ray for shortness of breath I independently visualized and interpreted imaging which showed no pneumonia, pneumothorax,  effusion.  CT without acute findings. I agree with the radiologist interpretation  [place] of legs negative for acute findings    Medicines ordered:  I ordered medication including pain medication  Reevaluation of the patient after these medicines showed that the patient improved  Dispostion:  After consideration of the diagnostic results and the patients response to treatment, I feel that the patent would benefit from outpatient management for back pain.  At this time, there does not appear to be an acute  or life-threatening condition.  No signs of infection, blood clot, or heart failure.  As patient has diffuse low back pain, this is likely the cause for her leg pain.  Will treat with anti-inflammatories, and have patient follow-up with her primary care doctor.  At this time, patient appears safe for discharge.  Return precautions given.  Patient states she understands and agrees to plan.   Final Clinical Impression(s) / ED Diagnoses Final diagnoses:  Pain in both lower extremities    Rx / DC Orders ED Discharge Orders          Ordered    meloxicam (MOBIC) 15 MG tablet  As needed        02/01/21 1523              Akila Batta, PA-C 02/01/21 1554    Kommor, [place], MD 02/01/21 1600

## 2021-02-01 NOTE — Discharge Instructions (Signed)
Your work-up today was overall reassuring.  There is no sign of infection or fluid on your lungs. Your ultrasound for blood clot was negative. Your pain is likely coming from your back.  Continue to take the gabapentin and muscle relaxers as prescribed.  Take Mobic to help with pain control. Continue taking your lasix as prescribed for leg swelling.  Call your primary care doctor to set up a follow-up appointment for recheck of your symptoms. Return to the emergency room with any new, worsening, concerning symptoms
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
## 2021-02-01 NOTE — Progress Notes (Signed)
Lower extremity venous bilateral study completed.  Preliminary results relayed to [place], [place].  See CV Proc for preliminary results report.   Darlin Coco, RDMS, RVT

## 2021-02-01 NOTE — ED Notes (Signed)
Patient place on 2L [place]. Patient state she use oxygen at home PRN. Patient state drop to 89 % on room air.

## 2021-02-01 NOTE — ED Notes (Signed)
I provided reinforced discharge education based off of discharge instructions. Pt acknowledged and understood my education. Pt had no further questions/concerns for provider/myself.  °

## 2021-02-05 ENCOUNTER — Ambulatory Visit: Payer: Medicaid Other | Admitting: Podiatry

## 2021-02-18 ENCOUNTER — Other Ambulatory Visit: Payer: Self-pay

## 2021-02-18 ENCOUNTER — Ambulatory Visit (INDEPENDENT_AMBULATORY_CARE_PROVIDER_SITE_OTHER): Payer: Medicaid Other | Admitting: Podiatry

## 2021-02-18 ENCOUNTER — Encounter: Payer: Self-pay | Admitting: Podiatry

## 2021-02-18 DIAGNOSIS — B351 Tinea unguium: Secondary | ICD-10-CM

## 2021-02-18 DIAGNOSIS — M79674 Pain in right toe(s): Secondary | ICD-10-CM | POA: Diagnosis not present

## 2021-02-18 DIAGNOSIS — M79675 Pain in left toe(s): Secondary | ICD-10-CM | POA: Diagnosis not present

## 2021-02-18 NOTE — Progress Notes (Signed)
This patient presents to the office with chief complaint of long thick nails     This patient says there are long thick painful nails.  These nails are painful walking and wearing shoes.   Patient is unable to  self treat her own nails . This patient presents  to the office today for treatment of the  long nails.  General Appearance  Alert, conversant and in no acute stress.  Vascular  Dorsalis pedis and posterior tibial  pulses are weakly  palpable  bilaterally.  Capillary return is within normal limits  bilaterally. Temperature is within normal limits  bilaterally.  Neurologic  Senn-Weinstein monofilament wire test within normal limits/diminished  B/L.  Muscle power within normal limits bilaterally.  Nails Thick disfigured discolored nails with subungual debris  from hallux to fifth toes bilaterally. No evidence of bacterial infection or drainage bilaterally.  Orthopedic  No limitations of motion of motion feet .  No crepitus or effusions noted.  No bony pathology or digital deformities noted.  Skin  Dry skin with no porokeratosis noted bilaterally.  No signs of infections or ulcers noted.     Onychomycosis    Debride nails x 10.    RTC 4 months.     Gardiner Barefoot DPM

## 2021-02-18 NOTE — Progress Notes (Signed)
nail 

## 2021-02-24 ENCOUNTER — Ambulatory Visit (INDEPENDENT_AMBULATORY_CARE_PROVIDER_SITE_OTHER): Payer: Medicaid Other | Admitting: Pulmonary Disease

## 2021-02-24 ENCOUNTER — Other Ambulatory Visit: Payer: Self-pay

## 2021-02-24 ENCOUNTER — Encounter: Payer: Self-pay | Admitting: Pulmonary Disease

## 2021-02-24 ENCOUNTER — Other Ambulatory Visit: Payer: Medicaid Other | Admitting: *Deleted

## 2021-02-24 ENCOUNTER — Ambulatory Visit (INDEPENDENT_AMBULATORY_CARE_PROVIDER_SITE_OTHER): Payer: Medicaid Other

## 2021-02-24 VITALS — BP 140/70 | HR 105 | Temp 98.1°F | Ht 65.5 in | Wt 340.8 lb

## 2021-02-24 DIAGNOSIS — R0609 Other forms of dyspnea: Secondary | ICD-10-CM

## 2021-02-24 DIAGNOSIS — E662 Morbid (severe) obesity with alveolar hypoventilation: Secondary | ICD-10-CM

## 2021-02-24 DIAGNOSIS — G4733 Obstructive sleep apnea (adult) (pediatric): Secondary | ICD-10-CM | POA: Diagnosis not present

## 2021-02-24 DIAGNOSIS — R053 Chronic cough: Secondary | ICD-10-CM

## 2021-02-24 DIAGNOSIS — E1169 Type 2 diabetes mellitus with other specified complication: Secondary | ICD-10-CM

## 2021-02-24 MED ORDER — FLUTICASONE FUROATE-VILANTEROL 100-25 MCG/ACT IN AEPB: 1.0000 | INHALATION_SPRAY | Freq: Every day | RESPIRATORY_TRACT | 5 refills | Status: DC

## 2021-02-24 NOTE — Progress Notes (Signed)
[facility]  Chief Complaint  Patient presents with   Follow-up    DOE    Constitutional:  BP 140/70 (BP Location: Right Arm, Cuff Size: Large)    Pulse (!) 105    Temp 98.1 F (36.7 C) (Oral)    Ht 5' 5.5" (1.664 m)    Wt (!) 340 lb 12.8 oz (154.6 kg)    LMP 05/29/2017 (Approximate)    SpO2 92%    BMI 55.85 kg/m   Past Medical History:  Hemorrhagic CVA, Psychogenic seizures, PSTD, Depression, Anxiety, HTN, Syphilis, HLD, Overactive bladder, COVID 16 March 2019  Past Surgical History:  She  has a past surgical history that includes Cesarean section.  Brief Summary:  Denise [name] is a 51 y.o. female smoker with dyspnea, cough, and obstructive sleep apnea with obesity hypoventilation syndrome.      Subjective:   She wasn't able to get CT chest scheduled.  Inhalers help her breathing.  She gets cough with clear sputum.  Using CPAP at night with oxygen.  Has to use oxygen sometimes during the day.  Physical Exam:   Appearance - well kempt   ENMT - no sinus tenderness, no oral exudate, no LAN, Mallampati 3 airway, no stridor  Respiratory - equal breath sounds bilaterally, no wheezing or rales  CV - s1s2 regular rate and rhythm, no murmurs  Ext - no clubbing, no edema  Skin - no rashes  Psych - normal mood and affect    Pulmonary testing:  PFT 04/17/20 >> FEV1 1.80 (75%), FEV1% 88, TLC 3.54 (68%), DLCO 58%  Chest Imaging:    Sleep Tests:  HST 10/07/17 >> AHI 25, SpO2 low 69% PSG 08/23/19 >> AHI 10.6, SpO2 low 80%; CPAP 17 cm H2O with 2 liters O2 CPAP 01/24/21 to 02/22/21 >> used on 30 of 30 nights with average 12 hrs 11 min.  Average AHI 0.8 with CPAP 17 cm H2O  Cardiac Tests:  Echo 06/06/20 >> EF 55 to 60%, grade 1 DD  Social History:  She  reports that she has been smoking cigarettes. She has been smoking an average of .5 packs per day. She has never used smokeless tobacco. She reports current alcohol  use of about 2.0 standard drinks per week. She reports that she does not currently use drugs after having used the following drugs: "Crack" cocaine.  Family History:  Her family history includes Alcohol abuse in her mother; Anxiety disorder in her father and mother; Depression in her mother; Drug abuse in her father and mother; Heart disease in her maternal grandmother; Stroke in her mother.     Assessment/Plan:   Obstructive sleep apnea. - she is compliant with CPAP and reports benefit from therapy - uses [employer] for her DME - continue CPAP 17 cm H2O   Obesity hypoventilation syndrome. - continue 2 liters oxygen at night with CPAP  Dyspnea on exertion with productive cough and history of tobacco abuse.  - likely has chronic bronchitis in setting of smoking, obesity with deconditioning - will change from flovent to breo 100 one puff daily - prn albuterol - chest xray today; if abnormal then reschedule HRCT chest to assess for ILD  Tobacco abuse. - she will try using nicotine patch  Obesity. - encouraged her to keep up with weight loss efforts    Time Spent Involved in Patient Care on Day of Examination:  36 minutes  Follow up:   Patient Instructions  Stop using flovent  Breo one puff daily, and rinse your mouth after each use  Chest xray today  Follow up in 6 months  Medication List:   Allergies as of 02/24/2021       Reactions   Gadolinium Derivatives Nausea And Vomiting   Nausea and vomiting was followed by epileptic seizure episode that lasted approximately 5 minutes; pt was unable to verbally communicate during that time; she then came to and was able to speak; was evaluated by Rad and RN; kms    Grapefruit Flavor [flavoring Agent] Other (See Comments)   Drug interaction        Medication List        Accurate as of February 24, 2021  9:23 AM. If you have any questions, ask your nurse or doctor.          STOP taking these medications    Flovent HFA  110 MCG/ACT inhaler Generic drug: fluticasone Stopped by: Chesley Mires, MD       TAKE these medications    amLODipine 10 MG tablet Commonly known as: NORVASC Take 10 mg by mouth daily.   atorvastatin 20 MG tablet Commonly known as: LIPITOR Take 1 tablet (20 mg total) by mouth daily.   benztropine 1 MG tablet Commonly known as: COGENTIN Take 1 tablet (1 mg total) by mouth 2 (two) times daily.   brexpiprazole 0.25 MG Tabs tablet Commonly known as: [name] Take 3 tablets (0.75 mg total) by mouth daily.   busPIRone 30 MG tablet Commonly known as: BUSPAR Take 1 tablet (30 mg total) by mouth 2 (two) times daily.   fluticasone furoate-vilanterol 100-25 MCG/ACT Aepb Commonly known as: Breo Ellipta Inhale 1 puff into the lungs daily. Started by: Chesley Mires, MD   furosemide 40 MG tablet Commonly known as: LASIX Take 1 tablet (40 mg total) by mouth daily.   gabapentin 300 MG capsule Commonly known as: NEURONTIN Take 2 capsules in morning, 2 capsules in afternoon and 3 capsules at bedtime.   hydrochlorothiazide 25 MG tablet Commonly known as: HYDRODIURIL Take 25 mg by mouth daily.   lisinopril 20 MG tablet Commonly known as: ZESTRIL Take 20 mg by mouth 2 (two) times daily.   meloxicam 15 MG tablet Commonly known as: MOBIC Take 1 tablet (15 mg total) by mouth as needed.   metFORMIN 500 MG tablet Commonly known as: GLUCOPHAGE Take 500 mg by mouth 2 (two) times daily.   methocarbamol 750 MG tablet Commonly known as: ROBAXIN Take 750 mg by mouth 2 (two) times daily as needed.   nortriptyline 75 MG capsule Commonly known as: Pamelor Take 2 capsules (150 mg total) by mouth at bedtime.   PEG 3350 17 GM/SCOOP Powd SMARTSIG:17 Gram(s) By Mouth 1-2 Times Daily   ProAir HFA 108 (90 Base) MCG/ACT inhaler Generic drug: albuterol inhale 2 PUFFS into THE lungs EVERY 6 HOURS AS NEEDED FOR WHEEZING OR SHORTNESS OF BREATH        Signature:  Chesley Mires, MD [place] Pager - [phone] 02/24/2021, 9:23 AM

## 2021-02-24 NOTE — Patient Instructions (Signed)
Stop using flovent  Breo one puff daily, and rinse your mouth after each use  Chest xray today  Follow up in 6 months

## 2021-02-26 ENCOUNTER — Other Ambulatory Visit (HOSPITAL_COMMUNITY): Payer: Self-pay

## 2021-02-26 ENCOUNTER — Other Ambulatory Visit: Payer: Self-pay

## 2021-02-26 ENCOUNTER — Telehealth: Payer: Self-pay | Admitting: Pulmonary Disease

## 2021-02-26 ENCOUNTER — Other Ambulatory Visit: Payer: Self-pay | Admitting: Neurology

## 2021-02-26 ENCOUNTER — Ambulatory Visit (HOSPITAL_BASED_OUTPATIENT_CLINIC_OR_DEPARTMENT_OTHER): Payer: Medicaid Other | Admitting: Psychiatry

## 2021-02-26 ENCOUNTER — Encounter (HOSPITAL_COMMUNITY): Payer: Self-pay | Admitting: Psychiatry

## 2021-02-26 DIAGNOSIS — G2589 Other specified extrapyramidal and movement disorders: Secondary | ICD-10-CM

## 2021-02-26 DIAGNOSIS — F411 Generalized anxiety disorder: Secondary | ICD-10-CM | POA: Diagnosis not present

## 2021-02-26 DIAGNOSIS — F4312 Post-traumatic stress disorder, chronic: Secondary | ICD-10-CM

## 2021-02-26 DIAGNOSIS — F333 Major depressive disorder, recurrent, severe with psychotic symptoms: Secondary | ICD-10-CM | POA: Diagnosis not present

## 2021-02-26 MED ORDER — BENZTROPINE MESYLATE 1 MG PO TABS: 1.0000 mg | ORAL_TABLET | Freq: Two times a day (BID) | ORAL | 0 refills | Status: DC

## 2021-02-26 MED ORDER — BREXPIPRAZOLE 1 MG PO TABS: 1.0000 mg | ORAL_TABLET | Freq: Every day | ORAL | 0 refills | Status: DC

## 2021-02-26 MED ORDER — BUSPIRONE HCL 30 MG PO TABS: 30.0000 mg | ORAL_TABLET | Freq: Two times a day (BID) | ORAL | 0 refills | Status: DC

## 2021-02-26 MED ORDER — NORTRIPTYLINE HCL 75 MG PO CAPS: 150.0000 mg | ORAL_CAPSULE | Freq: Every day | ORAL | 0 refills | Status: DC

## 2021-02-26 NOTE — Telephone Encounter (Signed)
Spoke with a Occupational psychologist at [employer]. She confirmed that the Breo 100 does need a prior authorization in order to be dispensed.   PA Team, can we work on this PA for the patient? Thanks!

## 2021-02-26 NOTE — Progress Notes (Signed)
[place] MD/PA/NP OP Progress Note  02/26/2021 10:33 AM Denise [name]  MRN:  [id number]  Chief Complaint:  Chief Complaint   Depression   irritable HPI: Denise [name] shares that she has been feeling more irritable and depressed than usual. It has been going on for a while now. She feels depressed daily and the level is 10/10 (10 being the worst). Everything little thing makes her irritable. Most of the time she will yell and sometimes she throws things like her phone. She has no intention or desire to hurt anyone. Her energy and motivation are low. Her sleep is poor and most nights it doesn't feel like she slept at all. Denise [name] stays in bed most days. She watches tv, talks on the phone or nothing. Her appetite is poor and she is eating small snacks thru out the day. She is gaining weight and is working with her PCP. Denise [name] states she can't focus on anything. Denise [name] is very worried about her health problems. She has daily negative self talk. She denies passive thoughts of death. She denies SI/HI. Denise [name] has daily issues with intrusive memories. She denies HV. Denise [name] tries to avoid triggers like watching news on tv. Denise [name] is smoking about 7-8 cigs/day and states patches do not work.   Visit Diagnosis:    ICD-10-CM   1. Severe episode of recurrent major depressive disorder, with psychotic features ([place])  F33.3 busPIRone (BUSPAR) 30 MG tablet    nortriptyline (PAMELOR) 75 MG capsule    brexpiprazole ([name]) 1 MG TABS tablet    2. Chronic post-traumatic stress disorder (PTSD)  F43.12 busPIRone (BUSPAR) 30 MG tablet    3. GAD (generalized anxiety disorder)  F41.1 busPIRone (BUSPAR) 30 MG tablet    nortriptyline (PAMELOR) 75 MG capsule    4. Combined pyramidal-extrapyramidal syndrome  G25.89 benztropine (COGENTIN) 1 MG tablet      Past Psychiatric History: see previous notes.  Past Medical History:  Past Medical History:  Diagnosis Date   Abnormal EKG    AD (atopic dermatitis)     Alcohol use 04/14/2015   Anxiety    Arthritis    Blood transfusion without reported diagnosis    Depression    Diabetes mellitus without complication ([place])    Fibroid uterus 12/18/2014   Hyperlipemia    Hypertension    Memory changes    Overactive bladder    Pelvic pain in female 12/18/2014   Prediabetes    Right sided weakness    Seizures (HCC)    Stroke ([place])    Substance abuse ([place])    Syncope 02/07/2015   TIA (transient ischemic attack)    Tobacco use 04/14/2015    Past Surgical History:  Procedure Laterality Date   CESAREAN SECTION     x 2    Family Psychiatric and Medical History:  Family History  Problem Relation Age of Onset   Stroke Mother    Anxiety disorder Mother    Drug abuse Mother    Alcohol abuse Mother    Depression Mother    Heart disease Maternal Grandmother    Drug abuse Father    Anxiety disorder Father    Colon cancer Neg Hx    Esophageal cancer Neg Hx    Pancreatic cancer Neg Hx    Stomach cancer Neg Hx    Liver disease Neg Hx     Social History:  Social History   Socioeconomic History   Marital status: Married    Spouse name: [name]   Number of  children: 3   Years of education: 6   Highest education level: 6th grade  Occupational History   Not on file  Tobacco Use   Smoking status: Every Day    Packs/day: 0.40    Types: Cigarettes   Smokeless tobacco: Never  Vaping Use   Vaping Use: Never used  Substance and Sexual Activity   Alcohol use: Yes    Alcohol/week: 2.0 standard drinks    Types: 2 Standard drinks or equivalent per week   Drug use: Not Currently    Types: "Crack" cocaine   Sexual activity: Yes    Partners: Male    Birth control/protection: None  Other Topics Concern   Not on file  Social History Narrative   Not on file   Social Determinants of Health   Financial Resource Strain: Not on file  Food Insecurity: Not on file  Transportation Needs: Not on file  Physical Activity: Not on file  Stress:  Not on file  Social Connections: Not on file    Allergies:  Allergies  Allergen Reactions   Gadolinium Derivatives Nausea And Vomiting    Nausea and vomiting was followed by epileptic seizure episode that lasted approximately 5 minutes; pt was unable to verbally communicate during that time; she then came to and was able to speak; was evaluated by Rad and RN; kms    Grapefruit Flavor [Flavoring Agent] Other (See Comments)    Drug interaction    Metabolic Disorder Labs: Lab Results  Component Value Date   HGBA1C 5.4 09/25/2019   MPG 108.28 09/25/2019   MPG 120 02/07/2015   Lab Results  Component Value Date   PROLACTIN 8.1 10/30/2018   Lab Results  Component Value Date   CHOL 150 02/24/2021   TRIG 91 02/24/2021   HDL 49 02/24/2021   CHOLHDL 3.1 02/24/2021   VLDL 70 (H) 02/08/2015   LDLCALC 84 02/24/2021   LDLCALC 114 (H) 11/17/2020   Lab Results  Component Value Date   TSH 1.460 11/17/2020   TSH 5.147 (H) 09/25/2019    Therapeutic Level Labs: Lab Results  Component Value Date   LITHIUM 1.00 09/27/2019   LITHIUM 1.35 (H) 09/26/2019   No results found for: VALPROATE No components found for:  CBMZ  Current Medications: Current Outpatient Medications  Medication Sig Dispense Refill   amLODipine (NORVASC) 10 MG tablet Take 10 mg by mouth daily.     atorvastatin (LIPITOR) 20 MG tablet Take 1 tablet (20 mg total) by mouth daily. 90 tablet 3   fluticasone furoate-vilanterol (BREO ELLIPTA) 100-25 MCG/ACT AEPB Inhale 1 puff into the lungs daily. 30 each 5   furosemide (LASIX) 40 MG tablet Take 1 tablet (40 mg total) by mouth daily. 90 tablet 3   gabapentin (NEURONTIN) 300 MG capsule Take 2 capsules in morning, 2 capsules in afternoon and 3 capsules at bedtime. 210 capsule 5   hydrochlorothiazide (HYDRODIURIL) 25 MG tablet Take 25 mg by mouth daily.     lisinopril (PRINIVIL,ZESTRIL) 20 MG tablet Take 20 mg by mouth 2 (two) times daily.      meloxicam (MOBIC) 15 MG tablet  Take 1 tablet (15 mg total) by mouth as needed. 10 tablet 0   metFORMIN (GLUCOPHAGE) 500 MG tablet Take 500 mg by mouth 2 (two) times daily.  3   methocarbamol (ROBAXIN) 750 MG tablet Take 750 mg by mouth 2 (two) times daily as needed.     Polyethylene Glycol 3350 (PEG 3350) 17 GM/SCOOP POWD SMARTSIG:17 Gram(s) By  Mouth 1-2 Times Daily     PROAIR HFA 108 (90 Base) MCG/ACT inhaler inhale 2 PUFFS into THE lungs EVERY 6 HOURS AS NEEDED FOR WHEEZING OR SHORTNESS OF BREATH 8.5 g 11   benztropine (COGENTIN) 1 MG tablet Take 1 tablet (1 mg total) by mouth 2 (two) times daily. 180 tablet 0   brexpiprazole ([name]) 1 MG TABS tablet Take 1 tablet (1 mg total) by mouth daily. 90 tablet 0   busPIRone (BUSPAR) 30 MG tablet Take 1 tablet (30 mg total) by mouth 2 (two) times daily. 180 tablet 0   nortriptyline (PAMELOR) 75 MG capsule Take 2 capsules (150 mg total) by mouth at bedtime. 180 capsule 0   No current facility-administered medications for this visit.     Musculoskeletal: Strength & Muscle Tone: decreased Gait & Station: unsteady, using rolling walker Patient leans: Front  Psychiatric Specialty Exam: Review of Systems  Last menstrual period 05/29/2017.There is no height or weight on file to calculate BMI.  General Appearance: Casual  Eye Contact:  Fair  Speech:  Clear and Coherent  Volume:  Increased  Mood:  Anxious, Depressed, and Irritable  Affect:  Congruent  Thought Process:  concrete, Coherent and Descriptions of Associations: Intact  Orientation:  Full (Time, Place, and Person)  Thought Content: Logical   Suicidal Thoughts:  No  Homicidal Thoughts:  No  Memory:  Immediate;   Fair  Judgement:  Good  Insight:  Good  Psychomotor Activity:  Normal  Concentration:  Concentration: Fair  Recall:  Poor  Fund of Knowledge: Fair  Language: Fair  Akathisia:  No  Handed:  Right  AIMS (if indicated): not done  Assets:  Communication Skills Desire for Improvement Financial  Resources/Insurance [place] Talents/Skills Vocational/Educational  ADL's:  Intact  Cognition: WNL  Sleep:  Poor   Screenings: GAD-7    Flowsheet Row Office Visit from 07/15/2016 in [facility]  Total GAD-7 Score [place] Visit from 04/14/2015 in [facility]  Total Score (max 30 points ) 15      PHQ2-9    [place] Visit from 02/26/2021 in [facility] Video Visit from 01/08/2021 in [facility] Video Visit from 11/27/2020 in [facility] Visit from 08/28/2020 in [facility] Video Visit from 06/05/2020 in [facility]  PHQ-2 Total Score 6 4 6 1 4   PHQ-9 Total Score 21 22 17  -- [place] Office Visit from 02/26/2021 in [facility] ED from 02/01/2021 in [facility] Video Visit from 01/08/2021 in [facility]  C-SSRS RISK CATEGORY No Risk No Risk No Risk        Assessment and Plan:   Increase [name] to target depression symptoms  - reviewed labs 02/24/2021 Lipid panel WNL  02/01/21 BMP WNL, AST and ALT WNL, and CBC WNL. She is working with her PCP regarding her health issues.  EKG- per review she has a history of prolonged QTc. She is working with her cardiologist.  02/02/2021 QTc 469 02/01/21 QTc 469 10/13/20 QTc 455 04/08/20 QTc 494 06/12/20 QTc 473   1. Severe episode of recurrent major depressive disorder, with psychotic features ([place]) - busPIRone (BUSPAR) 30 MG tablet; Take 1 tablet (30 mg total) by mouth 2 (two)  times daily.  Dispense: 180 tablet; Refill: 0 - nortriptyline (PAMELOR) 75 MG capsule; Take 2 capsules (150 mg total) by  mouth at bedtime.  Dispense: 180 capsule; Refill: 0 - brexpiprazole ([name]) 1 MG TABS tablet; Take 1 tablet (1 mg total) by mouth daily.  Dispense: 90 tablet; Refill: 0  2. Chronic post-traumatic stress disorder (PTSD) - busPIRone (BUSPAR) 30 MG tablet; Take 1 tablet (30 mg total) by mouth 2 (two) times daily.  Dispense: 180 tablet; Refill: 0  3. GAD (generalized anxiety disorder) - busPIRone (BUSPAR) 30 MG tablet; Take 1 tablet (30 mg total) by mouth 2 (two) times daily.  Dispense: 180 tablet; Refill: 0 - nortriptyline (PAMELOR) 75 MG capsule; Take 2 capsules (150 mg total) by mouth at bedtime.  Dispense: 180 capsule; Refill: 0  4. Combined pyramidal-extrapyramidal syndrome - benztropine (COGENTIN) 1 MG tablet; Take 1 tablet (1 mg total) by mouth 2 (two) times daily.  Dispense: 180 tablet; Refill: 0    Charlcie Cradle, MD 02/26/2021, 10:33 AM

## 2021-02-27 ENCOUNTER — Telehealth: Payer: Self-pay

## 2021-02-27 ENCOUNTER — Other Ambulatory Visit (HOSPITAL_COMMUNITY): Payer: Self-pay

## 2021-02-27 LAB — LIPID PANEL
Chol/HDL Ratio: 3.1 ratio (ref 0.0–4.4)
Cholesterol, Total: 150 mg/dL (ref 100–199)
HDL: 49 mg/dL (ref >39)
LDL Chol Calc (NIH): 84 mg/dL (ref 0–99)
Triglycerides: 91 mg/dL (ref 0–149)
VLDL Cholesterol Cal: 17 mg/dL (ref 5–40)

## 2021-02-27 LAB — HEPATIC FUNCTION PANEL
ALT: 28 IU/L (ref 0–32)
AST: 21 IU/L (ref 0–40)
Albumin: 4.4 g/dL (ref 3.8–4.9)
Alkaline Phosphatase: 126 IU/L — ABNORMAL HIGH (ref 44–121)
Bilirubin Total: 0.2 mg/dL (ref 0.0–1.2)
Bilirubin, Direct: <0.1 mg/dL (ref 0.00–0.40)
Total Protein: 6.6 g/dL (ref 6.0–8.5)

## 2021-02-27 NOTE — Telephone Encounter (Signed)
Patient Advocate Encounter   Received notification from patient calls that prior authorization for Denise [name] is required by his/her insurance [place] [employer].   PA submitted on 02/27/21  [place] Tr[facility] #: [id number]  Status is pending    [facility] will continue to follow:  Patient Advocate Fax: [phone]

## 2021-02-27 NOTE — Telephone Encounter (Signed)
Patient Advocate Encounter   Received notification from [place] that the request for prior authorization for Denise [name] has been denied due to the patient not trying 2 of the preferred meds:  Advair Diskus, Advair HFA, Symbicort or Dulera.      Specialty Pharmacy Patient Advocate Fax: [phone]    Dr. Halford Chessman please advise

## 2021-02-27 NOTE — Telephone Encounter (Signed)
Patient Advocate Encounter  Received notification from [facility] that the request for prior authorization for Denise [name] has been denied due to the patient not trying 2 of the preferred meds:  Advair Diskus, Advair HFA, Symbicort or Dulera.     Specialty Pharmacy Patient Advocate Fax: [phone]

## 2021-03-02 MED ORDER — BUDESONIDE-FORMOTEROL FUMARATE 160-4.5 MCG/ACT IN AERO: 2.0000 | INHALATION_SPRAY | Freq: Two times a day (BID) | RESPIRATORY_TRACT | 11 refills | Status: DC

## 2021-03-02 NOTE — Telephone Encounter (Signed)
Called and notified patient of PA denial. She voiced understanding of chang in inhaler. [facility]. Nothing further needed.

## 2021-03-02 NOTE — Telephone Encounter (Signed)
Can change to symbicort 160 two puffs bid.

## 2021-03-02 NOTE — Addendum Note (Signed)
Addended by: Fritzi Mandes D on: 03/02/2021 09:03 AM   Modules accepted: Orders

## 2021-03-03 ENCOUNTER — Other Ambulatory Visit (HOSPITAL_COMMUNITY): Payer: Self-pay

## 2021-03-03 ENCOUNTER — Other Ambulatory Visit: Payer: Self-pay | Admitting: Psychiatry

## 2021-03-03 DIAGNOSIS — Z1231 Encounter for screening mammogram for malignant neoplasm of breast: Secondary | ICD-10-CM

## 2021-03-11 NOTE — Progress Notes (Signed)
Cardiology Office Note:    Date:  03/12/2021   ID:  Denise [name], DOB 05-03-1970, MRN [id number]  PCP:  [facility]   [facility] Providers Cardiologist:  None     CC: DOE follow up.  History of Present Illness:    Denise [name] is a 51 y.o. female with a hx of Morbid Obesity, HTN with DM, Tobacco Abuse and seizures,  OSA, on CPAP and OHS, who presented for evaluation 06/12/20. Had negative stress test in 2022 and we have increased her diuretic regimen.  Patient notes that she is doing poorly.   Notes memory issues- doesn't remember what happened at recent pulm visit. Notes bilateral leg pain and change in leg color.  Leg pain with exertion, cramping. Is on nocturnal O2 and had low Sat's today Husband just out of the hospital for symptomatic anemia and GI bleed. Was in th ED for worsening leg pain and discoloration.  Negative for blood clots in legs  No chest pressure. Notes DOE No palpitations and funny heart beats.  Ambulatory blood pressure not done.    Past Medical History:  Diagnosis Date   Abnormal EKG    AD (atopic dermatitis)    Alcohol use 04/14/2015   Anxiety    Arthritis    Blood transfusion without reported diagnosis    Depression    Diabetes mellitus without complication ([place])    Fibroid uterus 12/18/2014   Hyperlipemia    Hypertension    Memory changes    Overactive bladder    Pelvic pain in female 12/18/2014   Prediabetes    Right sided weakness    Seizures ([place])    Stroke ([place])    Substance abuse ([place])    Syncope 02/07/2015   TIA (transient ischemic attack)    Tobacco use 04/14/2015    Past Surgical History:  Procedure Laterality Date   CESAREAN SECTION     x 2    Current Medications: Current Meds  Medication Sig   amLODipine (NORVASC) 10 MG tablet Take 10 mg by mouth daily.   atorvastatin (LIPITOR) 20 MG tablet Take 1 tablet (20 mg total) by mouth daily.   benztropine (COGENTIN) 1  MG tablet Take 1 tablet (1 mg total) by mouth 2 (two) times daily.   brexpiprazole ([name]) 1 MG TABS tablet Take 1 tablet (1 mg total) by mouth daily.   budesonide-formoterol (SYMBICORT) 160-4.5 MCG/ACT inhaler Inhale 2 puffs into the lungs in the morning and at bedtime.   busPIRone (BUSPAR) 30 MG tablet Take 1 tablet (30 mg total) by mouth 2 (two) times daily.   furosemide (LASIX) 80 MG tablet Take 1 tablet (80 mg total) by mouth daily.   gabapentin (NEURONTIN) 300 MG capsule Take 2 capsules by mouth in morning, 2 capsules in afternoon and 3 capsules at bedtime.   lisinopril (PRINIVIL,ZESTRIL) 20 MG tablet Take 20 mg by mouth 2 (two) times daily.    meloxicam (MOBIC) 15 MG tablet Take 1 tablet (15 mg total) by mouth as needed.   metFORMIN (GLUCOPHAGE) 500 MG tablet Take 500 mg by mouth 2 (two) times daily.   methocarbamol (ROBAXIN) 750 MG tablet Take 750 mg by mouth 2 (two) times daily as needed.   nortriptyline (PAMELOR) 75 MG capsule Take 2 capsules (150 mg total) by mouth at bedtime.   Polyethylene Glycol 3350 (PEG 3350) 17 GM/SCOOP POWD SMARTSIG:17 Gram(s) By Mouth 1-2 Times Daily   PROAIR HFA 108 (90 Base) MCG/ACT inhaler inhale 2 PUFFS  into THE lungs EVERY 6 HOURS AS NEEDED FOR WHEEZING OR SHORTNESS OF BREATH   [DISCONTINUED] furosemide (LASIX) 40 MG tablet Take 1 tablet (40 mg total) by mouth daily.   [DISCONTINUED] hydrochlorothiazide (HYDRODIURIL) 25 MG tablet Take 25 mg by mouth daily.     Allergies:   Gadolinium derivatives and Grapefruit flavor [flavoring agent]   Social History   Socioeconomic History   Marital status: Married    Spouse name: [name]   Number of children: 3   Years of education: 6   Highest education level: 6th grade  Occupational History   Not on file  Tobacco Use   Smoking status: Every Day    Packs/day: 0.40    Types: Cigarettes   Smokeless tobacco: Never  Vaping Use   Vaping Use: Never used  Substance and Sexual Activity   Alcohol use:  Yes    Alcohol/week: 2.0 standard drinks    Types: 2 Standard drinks or equivalent per week   Drug use: Not Currently    Types: "Crack" cocaine   Sexual activity: Yes    Partners: Male    Birth control/protection: None  Other Topics Concern   Not on file  Social History Narrative   Not on file   Social Determinants of Health   Financial Resource Strain: Not on file  Food Insecurity: Not on file  Transportation Needs: Not on file  Physical Activity: Not on file  Stress: Not on file  Social Connections: Not on file    Social: lives with husband (has medical problems), originally from [place], feels depressed  Family History: The patient's family history includes Alcohol abuse in her mother; Anxiety disorder in her father and mother; Depression in her mother; Drug abuse in her father and mother; Heart disease in her maternal grandmother; Stroke in her mother. There is no history of Colon cancer, Esophageal cancer, Pancreatic cancer, Stomach cancer, or Liver disease.  ROS:   Please see the history of present illness.     All other systems reviewed and are negative.  EKGs/Labs/Other Studies Reviewed:    The following studies were reviewed today:  EKG:   03/12/21: SR rate 98 Qtc 500, Borderline Ant Inf 06/11/20: SR 85 Borderline Ant Inf pattern 03/24/20- uninterpretable scan from PCP  Transthoracic Echocardiogram: Date: 06/06/20 Results:  1. Left ventricular ejection fraction, by estimation, is 55 to 60%. The  left ventricle has normal function. The left ventricle has no regional  wall motion abnormalities. Left ventricular diastolic parameters are  consistent with Grade I diastolic  dysfunction (impaired relaxation).   2. Right ventricular systolic function is normal. The right ventricular  size is normal. Tricuspid regurgitation signal is inadequate for assessing  PA pressure.   3. The mitral valve is normal in structure. Trivial mitral valve  regurgitation. No evidence of  mitral stenosis.   4. The aortic valve is tricuspid. Aortic valve regurgitation is trivial.  No aortic stenosis is present.   NM Stress Testing : Date: 07/22/20 Results: Nuclear stress EF: 58%. The left ventricular ejection fraction is normal (55-65%). There was no ST segment deviation noted during stress. This is a low risk study. There is no evidence of ischemia and no evidence of previous infarction. The study is normal.    Recent Labs: 06/12/2020: NT-Pro BNP 16 11/17/2020: Magnesium 1.9; TSH 1.460 02/01/2021: B Natriuretic Peptide 14.9; BUN 7; Creatinine, Ser 0.75; Hemoglobin 13.4; Platelets 365; Potassium 3.7; Sodium 140 02/24/2021: ALT 28  Recent Lipid Panel    Component  Value Date/Time   CHOL 150 02/24/2021 0720   TRIG 91 02/24/2021 0720   HDL 49 02/24/2021 0720   CHOLHDL 3.1 02/24/2021 0720   CHOLHDL 7.5 02/08/2015 0422   VLDL 70 (H) 02/08/2015 0422   LDLCALC 84 02/24/2021 0720   Physical Exam:    VS:  BP 130/82    Pulse 98    Ht 5\' 5"  (1.651 m)    Wt (!) 149.7 kg Comment: per patient   LMP 05/29/2017 (Approximate)    SpO2 95%    BMI 54.91 kg/m     Wt Readings from Last 3 Encounters:  03/12/21 (!) 149.7 kg  02/24/21 (!) 154.6 kg  11/07/20 (!) 150 kg    Gen: no distress, morbid obesity   Neck: No JVD, thick neck Cardiac: No Rubs or Gallops, no murmur, RRR +2 radial pulses distant heart sounds Respiratory: Clear to auscultation bilaterally, normal effort, normal  respiratory rate GI: Soft, nontender, non-distended  MS: +1 edema, legs painful to touch;  moves all extremities Integument: Skin feels warm Neuro:  At time of evaluation, alert and oriented to person/place/time/situation  Psych: Normal affect, patient feels ok   ASSESSMENT:    1. Acute on chronic heart failure with preserved ejection fraction (HCC)   2. Claudication of lower extremity (HCC)   3. Obesity, diabetes, and hypertension syndrome ([place])   4. Hyperlipidemia associated with type 2 diabetes  mellitus (HCC)      PLAN:    HFpEF  Morbid Obesity HTN with DM Tobacco Abuse with seizure history HLD with DM OSA on CPAP and OHS Non syndromic Qtc prolongation  Leg Claudication - continue norvasc 10 mg PO Daily, lisinopril 40 mg (patient takes it 20 mg PO BID) - continue atorvastatin 20 mg PO Daily; LDL now < 100 - will stop HCTZ (we had attempted to stop this at last visit) and will increase lasix to 80 mg PO daily - will get labs in 1-2 weeks; may start MRA of SGLT2i based on results - discussed smoking cessation again - at last visit ABI and arterial duplex were deferred due to leg pain; given recent ED visit we will pursue test despite the potential discomfort of testing - will CC Dr. Halford Chessman (pulmonology) and Dr. Doyne Keel (Behavioral health- on nortriptyline)  Four months me or APP    Medication Adjustments/Labs and Tests Ordered: Current medicines are reviewed at length with the patient today.  Concerns regarding medicines are outlined above.  Orders Placed This Encounter  Procedures   Basic metabolic panel   Pro b natriuretic peptide   EKG 12-Lead   VAS [place] LOWER EXTREMITY ARTERIAL DUPLEX    Meds ordered this encounter  Medications   furosemide (LASIX) 80 MG tablet    Sig: Take 1 tablet (80 mg total) by mouth daily.    Dispense:  90 tablet    Refill:  3    Dose change.  D/C HCTZ.     Patient Instructions  Medication Instructions:  1) DISCONTINUE Hydrochlorothiazide 2) INCREASE Furosemide to 80mg  once daily  *If you need a refill on your cardiac medications before your next appointment, please call your pharmacy*   Lab Work: BMET and Pro BNP in one week  If you have labs (blood work) drawn today and your tests are completely normal, you will receive your results only by: [place] (if you have MyChart) OR A paper copy in the mail If you have any lab test that is abnormal or we need to change  your treatment, we will call you to review the  results.   Testing/Procedures: Your physician has requested that you have a lower or upper extremity arterial duplex with ABIs. This test is an ultrasound of the arteries in the legs or arms. It looks at arterial blood flow in the legs and arms. Allow one hour for Lower and Upper Arterial scans. There are no restrictions or special instructions    Follow-Up: At [facility], you and your health needs are our priority.  As part of our continuing mission to provide you with exceptional heart care, we have created designated Provider Care Teams.  These Care Teams include your primary Cardiologist (physician) and Advanced Practice Providers (APPs -  Physician Assistants and Nurse Practitioners) who all work together to provide you with the care you need, when you need it.  We recommend signing up for the patient portal called "MyChart".  Sign up information is provided on this After Visit Summary.  MyChart is used to connect with patients for Virtual Visits (Telemedicine).  Patients are able to view lab/test results, encounter notes, upcoming appointments, etc.  Non-urgent messages can be sent to your provider as well.   To learn more about what you can do with MyChart, go to [email or web address].    Your next appointment:   4 month(s)  The format for your next appointment:   In Person  Provider:   Rudean Haskell, MD    Other Instructions     Signed, Werner Lean, MD  03/12/2021 8:30 AM    [place]

## 2021-03-12 ENCOUNTER — Other Ambulatory Visit: Payer: Self-pay

## 2021-03-12 ENCOUNTER — Ambulatory Visit (INDEPENDENT_AMBULATORY_CARE_PROVIDER_SITE_OTHER): Payer: Medicaid Other | Admitting: Internal Medicine

## 2021-03-12 ENCOUNTER — Encounter: Payer: Self-pay | Admitting: Internal Medicine

## 2021-03-12 VITALS — BP 130/82 | HR 98 | Ht 65.0 in | Wt 330.0 lb

## 2021-03-12 DIAGNOSIS — I152 Hypertension secondary to endocrine disorders: Secondary | ICD-10-CM

## 2021-03-12 DIAGNOSIS — I5033 Acute on chronic diastolic (congestive) heart failure: Secondary | ICD-10-CM | POA: Insufficient documentation

## 2021-03-12 DIAGNOSIS — I739 Peripheral vascular disease, unspecified: Secondary | ICD-10-CM | POA: Insufficient documentation

## 2021-03-12 DIAGNOSIS — I5032 Chronic diastolic (congestive) heart failure: Secondary | ICD-10-CM | POA: Insufficient documentation

## 2021-03-12 DIAGNOSIS — E1159 Type 2 diabetes mellitus with other circulatory complications: Secondary | ICD-10-CM

## 2021-03-12 DIAGNOSIS — E785 Hyperlipidemia, unspecified: Secondary | ICD-10-CM

## 2021-03-12 DIAGNOSIS — E669 Obesity, unspecified: Secondary | ICD-10-CM

## 2021-03-12 DIAGNOSIS — E1169 Type 2 diabetes mellitus with other specified complication: Secondary | ICD-10-CM

## 2021-03-12 MED ORDER — FUROSEMIDE 80 MG PO TABS: 80.0000 mg | ORAL_TABLET | Freq: Every day | ORAL | 3 refills | Status: DC

## 2021-03-12 NOTE — Patient Instructions (Signed)
Medication Instructions:  1) DISCONTINUE Hydrochlorothiazide 2) INCREASE Furosemide to 80mg  once daily  *If you need a refill on your cardiac medications before your next appointment, please call your pharmacy*   Lab Work: BMET and Pro BNP in one week  If you have labs (blood work) drawn today and your tests are completely normal, you will receive your results only by: [place] (if you have MyChart) OR A paper copy in the mail If you have any lab test that is abnormal or we need to change your treatment, we will call you to review the results.   Testing/Procedures: Your physician has requested that you have a lower or upper extremity arterial duplex with ABIs. This test is an ultrasound of the arteries in the legs or arms. It looks at arterial blood flow in the legs and arms. Allow one hour for Lower and Upper Arterial scans. There are no restrictions or special instructions    Follow-Up: At [facility], you and your health needs are our priority.  As part of our continuing mission to provide you with exceptional heart care, we have created designated Provider Care Teams.  These Care Teams include your primary Cardiologist (physician) and Advanced Practice Providers (APPs -  Physician Assistants and Nurse Practitioners) who all work together to provide you with the care you need, when you need it.  We recommend signing up for the patient portal called "MyChart".  Sign up information is provided on this After Visit Summary.  MyChart is used to connect with patients for Virtual Visits (Telemedicine).  Patients are able to view lab/test results, encounter notes, upcoming appointments, etc.  Non-urgent messages can be sent to your provider as well.   To learn more about what you can do with MyChart, go to [email or web address].    Your next appointment:   4 month(s)  The format for your next appointment:   In Person  Provider:   Rudean Haskell, MD    Other  Instructions

## 2021-03-16 NOTE — Progress Notes (Signed)
NEUROLOGY FOLLOW UP OFFICE NOTE  Denise [name] [id number]  Assessment/Plan:   Scalp neuralgia - improved   Gabapentin to 600mg  in AM, 600mg  in afternoon and 900mg  at bedtime.  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   UPDATE: Increased gabapentin in August..   Still has sharp pain from time to time but much improved.  Occurs 3 times a day for 5-6 minutes.       Current NSAIDS:  none Current analgesics:  none Current anti-anxiolytic:  buspirone Current Antihypertensive medications:  Amlodipine, lisinopril, HCTZ Current Antidepressant/antipsychotic medications:  nortriptyline 150mg  at bedtime,  Current Anticonvulsant medications:  Gabapentin 600mg /600mg /900mg  Other medications:  [name], BuSpar, Cogentin (for combined pyramidal-extrapyramidal syndrome)     HISTORY:  She developed headaches following hospitalization in late August 2021 for lithium toxicity presenting with confusion and headaches.  CT of head personally reviewed showed partially empty sella and known old left basal ganglia infarct but no acute intracranial abnormality.  She reports that the headaches have not improved since treatment.  She gets the headaches daily, about 2 hours a day.  It feels like that her entire top of head (in cap distribution) is being stabbed with hot pokers.  She also endorses a hot pressure sticking her in her left eye.  Also a pressure on top of head.  She reports associated drooling off of the left side of her mouth.  Separate from the headaches, she reports that she has periodic blurred vision.  She also zones out from time to time.  She has increased difficulty speaking and slurs her speech.  She  has trouble just functioning and performing physical tasks.  MRI of brain without contrast on 11/05/2019 showed nonspecific chronic scattered white matter changes with chronic left basal ganglia infarcts and remote pontine microhemorrhage     Past NSAIDS:  Ibuprofen, meloxicam Past analgesics:  tramadol Past antidepressant medications:  Fluoxetine, citalopram, mirtazepine, Sertraline 200mg , [name]  Past anticonvulsant medications:  topiramate 50mg   PAST MEDICAL HISTORY: Past Medical History:  Diagnosis Date   Abnormal EKG    AD (atopic dermatitis)    Alcohol use 04/14/2015   Anxiety    Arthritis    Blood transfusion without reported diagnosis    Depression    Diabetes mellitus without complication ([place])    Fibroid uterus 12/18/2014   Hyperlipemia    Hypertension    Memory changes    Overactive bladder    Pelvic pain in female 12/18/2014   Prediabetes    Right sided weakness    Seizures ([place])    Stroke ([place])    Substance abuse ([place])    Syncope 02/07/2015   TIA (transient ischemic attack)    Tobacco use 04/14/2015    MEDICATIONS: Current Outpatient Medications on File Prior to Visit  Medication Sig Dispense Refill   amLODipine (NORVASC) 10 MG tablet Take 10 mg by mouth daily.     atorvastatin (LIPITOR) 20 MG tablet Take 1 tablet (20 mg total) by mouth daily. 90 tablet 3   benztropine (COGENTIN) 1 MG tablet Take 1 tablet (1 mg total) by mouth 2 (two) times daily. 180 tablet 0   brexpiprazole ([name]) 1 MG TABS tablet Take 1 tablet (1 mg total) by mouth daily. 90 tablet 0   budesonide-formoterol (  SYMBICORT) 160-4.5 MCG/ACT inhaler Inhale 2 puffs into the lungs in the morning and at bedtime. 10.2 g 11   busPIRone (BUSPAR) 30 MG tablet Take 1 tablet (30 mg total) by mouth 2 (two) times daily. 180 tablet 0   furosemide (LASIX) 80 MG tablet Take 1 tablet (80 mg total) by mouth daily. 90 tablet 3   gabapentin (NEURONTIN) 300 MG capsule Take 2 capsules by mouth in morning, 2  capsules in afternoon and 3 capsules at bedtime. 210 capsule 0   lisinopril (PRINIVIL,ZESTRIL) 20 MG tablet Take 20 mg by mouth 2 (two) times daily.      meloxicam (MOBIC) 15 MG tablet Take 1 tablet (15 mg total) by mouth as needed. 10 tablet 0   metFORMIN (GLUCOPHAGE) 500 MG tablet Take 500 mg by mouth 2 (two) times daily.  3   methocarbamol (ROBAXIN) 750 MG tablet Take 750 mg by mouth 2 (two) times daily as needed.     nortriptyline (PAMELOR) 75 MG capsule Take 2 capsules (150 mg total) by mouth at bedtime. 180 capsule 0   Polyethylene Glycol 3350 (PEG 3350) 17 GM/SCOOP POWD SMARTSIG:17 Gram(s) By Mouth 1-2 Times Daily     PROAIR HFA 108 (90 Base) MCG/ACT inhaler inhale 2 PUFFS into THE lungs EVERY 6 HOURS AS NEEDED FOR WHEEZING OR SHORTNESS OF BREATH 8.5 g 11   No current facility-administered medications on file prior to visit.    ALLERGIES: Allergies  Allergen Reactions   Gadolinium Derivatives Nausea And Vomiting    Nausea and vomiting was followed by epileptic seizure episode that lasted approximately 5 minutes; pt was unable to verbally communicate during that time; she then came to and was able to speak; was evaluated by Rad and RN; kms    Grapefruit Flavor [Flavoring Agent] Other (See Comments)    Drug interaction    FAMILY HISTORY: Family History  Problem Relation Age of Onset   Stroke Mother    Anxiety disorder Mother    Drug abuse Mother    Alcohol abuse Mother    Depression Mother    Heart disease Maternal Grandmother    Drug abuse Father    Anxiety disorder Father    Colon cancer Neg Hx    Esophageal cancer Neg Hx    Pancreatic cancer Neg Hx    Stomach cancer Neg Hx    Liver disease Neg Hx       Objective:  Blood pressure 131/80, pulse 89, height 5\' 5"  (1.651 m), weight (!) 330 lb (149.7 kg), last menstrual period 05/29/2017, SpO2 98 %. General: No acute distress.  Patient appears well-groomed.      Metta Clines, DO

## 2021-03-17 ENCOUNTER — Ambulatory Visit: Payer: Medicaid Other | Admitting: Neurology

## 2021-03-17 ENCOUNTER — Encounter: Payer: Self-pay | Admitting: Neurology

## 2021-03-17 ENCOUNTER — Other Ambulatory Visit: Payer: Self-pay

## 2021-03-17 VITALS — BP 131/80 | HR 89 | Ht 65.0 in | Wt 330.0 lb

## 2021-03-17 DIAGNOSIS — M792 Neuralgia and neuritis, unspecified: Secondary | ICD-10-CM | POA: Diagnosis not present

## 2021-03-17 MED ORDER — GABAPENTIN 300 MG PO CAPS: ORAL_CAPSULE | ORAL | 5 refills | Status: DC

## 2021-03-17 NOTE — Patient Instructions (Signed)
Refilled gabapentin

## 2021-03-19 ENCOUNTER — Other Ambulatory Visit: Payer: Self-pay

## 2021-03-19 ENCOUNTER — Other Ambulatory Visit: Payer: Medicaid Other

## 2021-03-19 DIAGNOSIS — I5033 Acute on chronic diastolic (congestive) heart failure: Secondary | ICD-10-CM

## 2021-03-20 LAB — BASIC METABOLIC PANEL
BUN/Creatinine Ratio: 14 (ref 9–23)
BUN: 10 mg/dL (ref 6–24)
CO2: 29 mmol/L (ref 20–29)
Calcium: 9.5 mg/dL (ref 8.7–10.2)
Chloride: 99 mmol/L (ref 96–106)
Creatinine, Ser: 0.73 mg/dL (ref 0.57–1.00)
Glucose: 133 mg/dL — ABNORMAL HIGH (ref 70–99)
Potassium: 3.8 mmol/L (ref 3.5–5.2)
Sodium: 141 mmol/L (ref 134–144)
eGFR: 99 mL/min/{1.73_m2} (ref >59)

## 2021-03-20 LAB — PRO B NATRIURETIC PEPTIDE: NT-Pro BNP: <36 pg/mL (ref 0–249)

## 2021-03-23 ENCOUNTER — Other Ambulatory Visit: Payer: Self-pay | Admitting: Internal Medicine

## 2021-03-23 DIAGNOSIS — I739 Peripheral vascular disease, unspecified: Secondary | ICD-10-CM

## 2021-04-01 ENCOUNTER — Telehealth: Payer: Self-pay | Admitting: Pulmonary Disease

## 2021-04-02 NOTE — Telephone Encounter (Signed)
Message from 03/02/2021:  [facility]ort [facility] as [facility] by patient. Nothing further needed.  ? ?Spoke with Denise [name] and provided the above information, she looked and saw the note from 03/02/21.  She stated she would contact the patient and let her know the Memory Dance has been changed to Symbicort.  Nothing further needed. ?

## 2021-04-03 ENCOUNTER — Ambulatory Visit (HOSPITAL_COMMUNITY)
Admission: RE | Admit: 2021-04-03 | Discharge: 2021-04-03 | Disposition: A | Discharge status: Home / Self Care | Payer: Medicaid Other | Source: Ambulatory Visit | Attending: Cardiovascular Disease | Admitting: Cardiovascular Disease

## 2021-04-03 ENCOUNTER — Other Ambulatory Visit: Payer: Self-pay

## 2021-04-03 DIAGNOSIS — I739 Peripheral vascular disease, unspecified: Secondary | ICD-10-CM | POA: Diagnosis not present

## 2021-04-08 ENCOUNTER — Ambulatory Visit
Admission: RE | Admit: 2021-04-08 | Discharge: 2021-04-08 | Disposition: A | Discharge status: Home / Self Care | Payer: Medicaid Other | Source: Ambulatory Visit | Attending: Psychiatry | Admitting: Psychiatry

## 2021-04-08 DIAGNOSIS — Z1231 Encounter for screening mammogram for malignant neoplasm of breast: Secondary | ICD-10-CM

## 2021-04-14 ENCOUNTER — Other Ambulatory Visit: Payer: Self-pay | Admitting: Pulmonary Disease

## 2021-04-23 ENCOUNTER — Telehealth (HOSPITAL_BASED_OUTPATIENT_CLINIC_OR_DEPARTMENT_OTHER): Payer: Medicaid Other | Admitting: Psychiatry

## 2021-04-23 DIAGNOSIS — F333 Major depressive disorder, recurrent, severe with psychotic symptoms: Secondary | ICD-10-CM

## 2021-04-23 DIAGNOSIS — F411 Generalized anxiety disorder: Secondary | ICD-10-CM

## 2021-04-23 DIAGNOSIS — G2589 Other specified extrapyramidal and movement disorders: Secondary | ICD-10-CM | POA: Diagnosis not present

## 2021-04-23 DIAGNOSIS — F4312 Post-traumatic stress disorder, chronic: Secondary | ICD-10-CM | POA: Diagnosis not present

## 2021-04-23 MED ORDER — LITHIUM CARBONATE ER 300 MG PO TBCR: 300.0000 mg | EXTENDED_RELEASE_TABLET | Freq: Every day | ORAL | 0 refills | Status: DC

## 2021-04-23 MED ORDER — NORTRIPTYLINE HCL 75 MG PO CAPS: 150.0000 mg | ORAL_CAPSULE | Freq: Every day | ORAL | 0 refills | Status: DC

## 2021-04-23 MED ORDER — BUSPIRONE HCL 30 MG PO TABS: 30.0000 mg | ORAL_TABLET | Freq: Two times a day (BID) | ORAL | 0 refills | Status: DC

## 2021-04-23 MED ORDER — BENZTROPINE MESYLATE 1 MG PO TABS: 1.0000 mg | ORAL_TABLET | Freq: Two times a day (BID) | ORAL | 0 refills | Status: DC

## 2021-04-23 NOTE — Progress Notes (Signed)
Virtual Visit via Telephone Note ? ?I connected with Denise [name] on 04/23/21 at  9:00 AM EDT by telephone and verified that I am speaking with the correct person using two identifiers. Denise [name] does not have internet or a smart phone so she can not do a Administrator, arts.  ? ?Location: ?Patient: home ?Provider: office ?  ?I discussed the limitations, risks, security and privacy concerns of performing an evaluation and management service by telephone and the availability of in person appointments. I also discussed with the patient that there may be a patient responsible charge related to this service. The patient expressed understanding and agreed to proceed. ? ? ?History of Present Illness: ?Denise [name] shares that she is always tired and has no motivation to do anything. Her 15yo son is [place] and has no place to stay so he will come to stay with her next month. Denise [name] is very worried about him. Her apartment is small and she wishes she could move someplace bigger. Her anxiety is worse and she states she worries all day. It causes headaches and fatigue. She is able to fall asleep with her medication. At night she gets about 4 hr/sleep and spends the rest of the time just lying in bed. She also naps thru out the day. She is still having nightmares about 3x/week. Denise [name] has vivid flashbacks on a near daily basis. Denise [name] is depressed everyday. She is endorsing anhedonia. Her appetite and concentration are poor. She is endorsing ongoing worthlessness. Praying helps to improve her mood. She is not sure if she having AVH. She denies SI/HI. The increase in [place] has not helped her mood at all.  ?  ?Observations/Objective: ? ?General Appearance: unable to assess  ?Eye Contact:  unable to assess  ?Speech:  Clear and Coherent and Normal Rate  ?Volume:  Normal  ?Mood:  Anxious and Depressed  ?Affect:  Congruent,   ?Thought Process: concrete, Coherent and Descriptions of Associations: Intact  ?Orientation:  Full (Time,  Place, and Person)  ?Thought Content:  Logical  ?Suicidal Thoughts:  No  ?Homicidal Thoughts:  No  ?Memory:  Immediate;   Poor  ?Judgement:  Fair  ?Insight:  Fair  ?Psychomotor Activity: unable to assess  ?Concentration:  Concentration: Fair  ?Recall:  Poor  ?Fund of Knowledge:  Fair  ?Language:  Fair  ?Akathisia:  unable to assess  ?Handed:  unable to assess  ?AIMS (if indicated):     ?Assets:  Communication Skills ?Desire for Improvement ?Financial Resources/Insurance ?Housing ?Intimacy ?Resilience ?Social Support ?Talents/Skills ?Vocational/Educational  ?ADL's:  unable to assess  ?Cognition:  WNL  ?Sleep:     ? ? ? ?Assessment and Plan: ? ?  04/23/2021  ?  9:09 AM 02/26/2021  ? 10:18 AM 01/08/2021  ? 10:00 AM 11/27/2020  ? 10:14 AM 08/28/2020  ?  2:39 PM  ?Depression screen PHQ 2/9  ?Decreased Interest '3 3 2 3 '$ 0  ?Down, Depressed, Hopeless '3 3 2 3 1  '$ ?PHQ - 2 Score '6 6 4 6 1  '$ ?Altered sleeping '2 3 3 '$ 0   ?Tired, decreased energy '3 3 3 3   '$ ?Change in appetite '3 3 3 2   '$ ?Feeling bad or failure about yourself  '3 3 3 3   '$ ?Trouble concentrating '3 3 3 3   '$ ?Moving slowly or fidgety/restless 3 0 3 0   ?Suicidal thoughts 0 0 0 0   ?PHQ-9 Score '23 21 22 17   '$ ?Difficult doing work/chores Extremely dIfficult Extremely dIfficult Very difficult  Extremely dIfficult   ? ? ?Flowsheet Row Video Visit from 04/23/2021 in [facility] Office Visit from 02/26/2021 in [facility] ED from 02/01/2021 in [place] DEPT  ?C-SSRS RISK CATEGORY No Risk No Risk No Risk  ? ?  ? ?D/c  [name] ? ?Start trial of Lithobid '300mg'$  po qD for mood ? ?1. Severe episode of recurrent major depressive disorder, with psychotic features ([place]) ?- busPIRone (BUSPAR) 30 MG tablet; Take 1 tablet (30 mg total) by mouth 2 (two) times daily.  Dispense: 180 tablet; Refill: 0 ?- nortriptyline (PAMELOR) 75 MG capsule; Take 2 capsules (150 mg total) by mouth at bedtime.   Dispense: 180 capsule; Refill: 0 ?- lithium carbonate (LITHOBID) 300 MG CR tablet; Take 1 tablet (300 mg total) by mouth daily.  Dispense: 30 tablet; Refill: 0 ? ?2. Chronic post-traumatic stress disorder (PTSD) ?- busPIRone (BUSPAR) 30 MG tablet; Take 1 tablet (30 mg total) by mouth 2 (two) times daily.  Dispense: 180 tablet; Refill: 0 ? ?3. GAD (generalized anxiety disorder) ?- busPIRone (BUSPAR) 30 MG tablet; Take 1 tablet (30 mg total) by mouth 2 (two) times daily.  Dispense: 180 tablet; Refill: 0 ?- nortriptyline (PAMELOR) 75 MG capsule; Take 2 capsules (150 mg total) by mouth at bedtime.  Dispense: 180 capsule; Refill: 0 ? ?4. Combined pyramidal-extrapyramidal syndrome ?- benztropine (COGENTIN) 1 MG tablet; Take 1 tablet (1 mg total) by mouth 2 (two) times daily.  Dispense: 180 tablet; Refill: 0 ? ? ? ?Follow Up Instructions: ?In 2-3 in weeks or sooner if needed ?  ?I discussed the assessment and treatment plan with the patient. The patient was provided an opportunity to ask questions and all were answered. The patient agreed with the plan and demonstrated an understanding of the instructions. ?  ?The patient was advised to call back or seek an in-person evaluation if the symptoms worsen or if the condition fails to improve as anticipated. ? ?I provided 16 minutes of non-face-to-face time during this encounter. ? ? ?Charlcie Cradle, MD ? ?

## 2021-05-14 ENCOUNTER — Other Ambulatory Visit (HOSPITAL_COMMUNITY): Payer: Self-pay | Admitting: *Deleted

## 2021-05-14 ENCOUNTER — Telehealth (HOSPITAL_BASED_OUTPATIENT_CLINIC_OR_DEPARTMENT_OTHER): Payer: Medicaid Other | Admitting: Psychiatry

## 2021-05-14 DIAGNOSIS — G2589 Other specified extrapyramidal and movement disorders: Secondary | ICD-10-CM | POA: Diagnosis not present

## 2021-05-14 DIAGNOSIS — G4701 Insomnia due to medical condition: Secondary | ICD-10-CM | POA: Diagnosis not present

## 2021-05-14 DIAGNOSIS — F333 Major depressive disorder, recurrent, severe with psychotic symptoms: Secondary | ICD-10-CM | POA: Diagnosis not present

## 2021-05-14 DIAGNOSIS — F411 Generalized anxiety disorder: Secondary | ICD-10-CM | POA: Diagnosis not present

## 2021-05-14 MED ORDER — VALBENAZINE TOSYLATE 40 MG PO CAPS: 40.0000 mg | ORAL_CAPSULE | Freq: Every day | ORAL | 0 refills | Status: DC

## 2021-05-14 MED ORDER — LITHIUM CARBONATE ER 450 MG PO TBCR: 450.0000 mg | EXTENDED_RELEASE_TABLET | Freq: Every day | ORAL | 0 refills | Status: DC

## 2021-05-14 NOTE — Progress Notes (Signed)
Virtual Visit via Telephone Note ? ?I connected with Denise [name] on 05/14/21 at  8:30 AM EDT by telephone and verified that I am speaking with the correct person using two identifiers. ? ?Location: ?Patient: home ?Provider: office ?  ?I discussed the limitations, risks, security and privacy concerns of performing an evaluation and management service by telephone and the availability of in person appointments. I also discussed with the patient that there may be a patient responsible charge related to this service. The patient expressed understanding and agreed to proceed. ? ? ?History of Present Illness: ?Denise [name] is doing "so/so". She is somewhat stressed because she is looking for a place to live with her husband and son. They are living in a motel right now. She might have found an apartment and are waiting to hear back. Since yesterday she has been having a headache. Denise [name] admits to sleeping during the night and day. She is usually tired all the time. She is depressed daily and the level is 9/10. She is having anhedonia. Denise [name] suffers from negative self thoughts. Her appetite is poor and she is eating 1 meal/day and is snacking. She denies SI/HI. Her anxiety is ongoing and it feels like there is always something on her mind. Denise [name] is not sure if she is having AVH.  ?  ? ?Her husband joined the call and asked about Denise [name] potentially having TD. He states she drops everything and gives examples of meds, utensils, dishes and other things. This happens all thru out the day. It has been going on for a long while. Her husband is also concerned about her memory and concentration. She gets lost easily when outside. Denise [name] gets upset easily and spends a lot of time sleeping. He feels like she needs help with everything and doesn't understand things people are saying to her during conversations. It all seems to be getting worse. She does have a neurologist, Dr. Tomi Likens. The appointment is scheduled for  07/23/21.  ? ?Observations/Objective: ? ?General Appearance: unable to assess  ?Eye Contact:  unable to assess  ?Speech:  Clear and Coherent and Normal Rate  ?Volume:  Normal  ?Mood:  Anxious and Depressed  ?Affect:  Congruent  ?Thought Process:  slow, concrete, Coherent and Descriptions of Associations: Intact  ?Orientation:  Full (Time, Place, and Person)  ?Thought Content:  Logical  ?Suicidal Thoughts:  No  ?Homicidal Thoughts:  No  ?Memory:  Immediate;   Poor  ?Judgement:  Fair  ?Insight:  Fair  ?Psychomotor Activity: unable to assess  ?Concentration:  Concentration: Fair  ?Recall:  Poor  ?Fund of Knowledge:  Fair  ?Language:  Fair  ?Akathisia:  unable to assess  ?Handed:  unable to assess  ?AIMS (if indicated):     ?Assets:  Communication Skills ?Desire for Improvement ?Financial Resources/Insurance ?Housing ?Intimacy ?Social Support ?Talents/Skills ?Vocational/Educational  ?ADL's:  unable to assess  ?Cognition:  WNL  ?Sleep:     ? ? ? ?Assessment and Plan: ? ?  05/14/2021  ?  8:44 AM 04/23/2021  ?  9:09 AM 02/26/2021  ? 10:18 AM 01/08/2021  ? 10:00 AM 11/27/2020  ? 10:14 AM  ?Depression screen PHQ 2/9  ?Decreased Interest 0 '3 3 2 3  '$ ?Down, Depressed, Hopeless '3 3 3 2 3  '$ ?PHQ - 2 Score '3 6 6 4 6  '$ ?Altered sleeping '3 2 3 3 '$ 0  ?Tired, decreased energy '3 3 3 3 3  '$ ?Change in appetite '3 3 3 3 2  '$ ?Feeling bad or  failure about yourself  '2 3 3 3 3  '$ ?Trouble concentrating '3 3 3 3 3  '$ ?Moving slowly or fidgety/restless 0 3 0 3 0  ?Suicidal thoughts 0 0 0 0 0  ?PHQ-9 Score '17 23 21 22 17  '$ ?Difficult doing work/chores Extremely dIfficult Extremely dIfficult Extremely dIfficult Very difficult Extremely dIfficult  ? ? ?Flowsheet Row Video Visit from 05/14/2021 in [facility] Video Visit from 04/23/2021 in [facility] Office Visit from 02/26/2021 in [facility]  ?C-SSRS RISK CATEGORY No Risk No Risk No Risk  ? ?   ? ? ?1. Severe episode of recurrent major depressive disorder, with psychotic features ([place]) ?- lithium carbonate (ESKALITH) 450 MG CR tablet; Take 1 tablet (450 mg total) by mouth daily.  Dispense: 90 tablet; Refill: 0 ? ?2. GAD (generalized anxiety disorder) ? ?3. Insomnia due to medical condition ? ?4. Combined pyramidal-extrapyramidal syndrome ?- valbenazine (INGREZZA) 40 MG capsule; Take 1 capsule (40 mg total) by mouth daily.  Dispense: 30 capsule; Refill: 0 ? ?Discontinue Cogentin ? ?Start Ingrezza for TD ?Increase Lithium '450mg'$  po qD ? ?Continue Buspar '30mg'$  BID ?Nortriptyline '150mg'$  po qHS ? ?Collaboration of Care: Other provider involved in patient's care AEB with neurologist  due to concerns about memory and dropping things. Appt scheduled for 07/23/21 at 9:50am ? ?Patient/Guardian was advised Release of Information must be obtained prior to any record release in order to collaborate their care with an outside provider. Patient/Guardian was advised if they have not already done so to contact the registration department to sign all necessary forms in order for [place] to release information regarding their care.  ? ?Consent: Patient/Guardian gives verbal consent for treatment and assignment of benefits for services provided during this visit. Patient/Guardian expressed understanding and agreed to proceed.  ? ? ?Follow Up Instructions: ?In 1-2 months or sooner if needed- in office visit ?  ?I discussed the assessment and treatment plan with the patient. The patient was provided an opportunity to ask questions and all were answered. The patient agreed with the plan and demonstrated an understanding of the instructions. ?  ?The patient was advised to call back or seek an in-person evaluation if the symptoms worsen or if the condition fails to improve as anticipated. ? ?I provided 24 minutes of non-face-to-face time during this encounter. ? ? ?Charlcie Cradle, MD ? ?

## 2021-05-20 ENCOUNTER — Ambulatory Visit (INDEPENDENT_AMBULATORY_CARE_PROVIDER_SITE_OTHER): Payer: Medicaid Other | Admitting: Podiatry

## 2021-05-20 ENCOUNTER — Encounter: Payer: Self-pay | Admitting: Podiatry

## 2021-05-20 DIAGNOSIS — M79674 Pain in right toe(s): Secondary | ICD-10-CM

## 2021-05-20 DIAGNOSIS — B351 Tinea unguium: Secondary | ICD-10-CM

## 2021-05-20 DIAGNOSIS — M79675 Pain in left toe(s): Secondary | ICD-10-CM | POA: Diagnosis not present

## 2021-05-20 DIAGNOSIS — I739 Peripheral vascular disease, unspecified: Secondary | ICD-10-CM

## 2021-05-20 DIAGNOSIS — R600 Localized edema: Secondary | ICD-10-CM

## 2021-05-20 DIAGNOSIS — E1141 Type 2 diabetes mellitus with diabetic mononeuropathy: Secondary | ICD-10-CM

## 2021-05-20 NOTE — Progress Notes (Signed)
This patient presents to the office with chief complaint of long thick nails     This patient says there are long thick painful nails.  These nails are painful walking and wearing shoes.   Patient is unable to  self treat her own nails . Patient has been diagnosed with diabetes and claudication. This patient presents  to the office today for treatment of the  long nails.  General Appearance  Alert, conversant and in no acute stress.  Vascular  Dorsalis pedis and posterior tibial  pulses are weakly  palpable  bilaterally.  Capillary return is within normal limits  bilaterally. Temperature is within normal limits  bilaterally.  Neurologic  Senn-Weinstein monofilament wire test within normal limits/diminished  B/L.  Muscle power within normal limits bilaterally.  Nails Thick disfigured discolored nails with subungual debris  from hallux to fifth toes bilaterally. No evidence of bacterial infection or drainage bilaterally.  Orthopedic  No limitations of motion of motion feet .  No crepitus or effusions noted.  No bony pathology or digital deformities noted.  Skin  Dry skin with no porokeratosis noted bilaterally.  No signs of infections or ulcers noted.     Onychomycosis    Debride nails x 10.    RTC 3 months.     Takeria Marquina DPM  

## 2021-06-15 DIAGNOSIS — R2689 Other abnormalities of gait and mobility: Secondary | ICD-10-CM

## 2021-06-15 DIAGNOSIS — I872 Venous insufficiency (chronic) (peripheral): Secondary | ICD-10-CM

## 2021-06-15 HISTORY — DX: Venous insufficiency (chronic) (peripheral): I87.2

## 2021-06-15 HISTORY — DX: Other abnormalities of gait and mobility: R26.89

## 2021-06-25 ENCOUNTER — Ambulatory Visit (HOSPITAL_BASED_OUTPATIENT_CLINIC_OR_DEPARTMENT_OTHER): Payer: Medicaid Other | Admitting: Psychiatry

## 2021-06-25 ENCOUNTER — Encounter (HOSPITAL_COMMUNITY): Payer: Self-pay | Admitting: Psychiatry

## 2021-06-25 DIAGNOSIS — F333 Major depressive disorder, recurrent, severe with psychotic symptoms: Secondary | ICD-10-CM

## 2021-06-25 DIAGNOSIS — F4312 Post-traumatic stress disorder, chronic: Secondary | ICD-10-CM | POA: Diagnosis not present

## 2021-06-25 DIAGNOSIS — F411 Generalized anxiety disorder: Secondary | ICD-10-CM

## 2021-06-25 DIAGNOSIS — G2589 Other specified extrapyramidal and movement disorders: Secondary | ICD-10-CM

## 2021-06-25 MED ORDER — LITHIUM CARBONATE ER 300 MG PO TBCR: 300.0000 mg | EXTENDED_RELEASE_TABLET | Freq: Two times a day (BID) | ORAL | 0 refills | Status: DC

## 2021-06-25 MED ORDER — NORTRIPTYLINE HCL 75 MG PO CAPS: 150.0000 mg | ORAL_CAPSULE | Freq: Every day | ORAL | 0 refills | Status: DC

## 2021-06-25 MED ORDER — BUSPIRONE HCL 15 MG PO TABS: 15.0000 mg | ORAL_TABLET | Freq: Two times a day (BID) | ORAL | 0 refills | Status: DC

## 2021-06-25 MED ORDER — VALBENAZINE TOSYLATE 40 MG PO CAPS: 40.0000 mg | ORAL_CAPSULE | Freq: Every day | ORAL | 0 refills | Status: DC

## 2021-06-25 NOTE — Progress Notes (Signed)
[facility] MD/PA/NP OP Progress Note  06/25/2021 1:55 PM Denise [name]  MRN:  [id number]  Chief Complaint:  Chief Complaint  Patient presents with   Depression   Anxiety   Insomnia   HPI: Denise [name] joins me in the office today.  She states "I am living.  I do not know what is going on with me though".  She has been experiencing increased forgetfulness and has trouble concentrating on things.  She often loses her train of thought when she is speaking.  Even while speaking with someone if she looks away she forgets that they are there and does not continue the conversation.  Her sleep is variable.  Most nights she is having intense dreams.  A lot of her day she is thinking about the past and is describing something like a flashback happening.  It is like the past is haunting her.  Her depression is "twisted".  She does feel more depressed than before.  She states that most of the month of May she was feeling depressed and the level was 9 out of 10 (10 being the worst).  She denies any hypervigilance.  She is endorsing increased anxiety and inability to relax at times.  She is also endorsing some mild anhedonia.  Her appetite is not high and she ends up eating some lollipops and feels full. She is joining a weight loss clinic in June. She denies SI/HI.  She continues to experience flicking of her wrist and fingers which causes her to drop many things.  She does not think that she has been taking the Ingrezza but is not sure.  She has an appointment with her neurologist on June 29.    Visit Diagnosis:    ICD-10-CM   1. Severe episode of recurrent major depressive disorder, with psychotic features (HCC)  F33.3 busPIRone (BUSPAR) 15 MG tablet    lithium carbonate (LITHOBID) 300 MG CR tablet    nortriptyline (PAMELOR) 75 MG capsule    2. Chronic post-traumatic stress disorder (PTSD)  F43.12 busPIRone (BUSPAR) 15 MG tablet    3. GAD (generalized anxiety disorder)  F41.1 busPIRone (BUSPAR) 15 MG tablet     nortriptyline (PAMELOR) 75 MG capsule    4. Combined pyramidal-extrapyramidal syndrome  G25.89 valbenazine (INGREZZA) 40 MG capsule      Past Psychiatric History: unchanged and no updates per patient  Past Medical History:  Past Medical History:  Diagnosis Date   Abnormal EKG    AD (atopic dermatitis)    Alcohol use 04/14/2015   Anxiety    Arthritis    Blood transfusion without reported diagnosis    Depression    Diabetes mellitus without complication ([place])    Fibroid uterus 12/18/2014   Hyperlipemia    Hypertension    Memory changes    Overactive bladder    Pelvic pain in female 12/18/2014   Prediabetes    Right sided weakness    Seizures (HCC)    Stroke ([place])    Substance abuse ([place])    Syncope 02/07/2015   TIA (transient ischemic attack)    Tobacco use 04/14/2015    Past Surgical History:  Procedure Laterality Date   CESAREAN SECTION     x 2    Family Psychiatric and Medical History:  Family History  Problem Relation Age of Onset   Stroke Mother    Anxiety disorder Mother    Drug abuse Mother    Alcohol abuse Mother    Depression Mother    Drug abuse  Father    Anxiety disorder Father    Heart disease Maternal Grandmother    Colon cancer Neg Hx    Esophageal cancer Neg Hx    Pancreatic cancer Neg Hx    Stomach cancer Neg Hx    Liver disease Neg Hx    Breast cancer Neg Hx     Social History:  Social History   Socioeconomic History   Marital status: Married    Spouse name: [name]   Number of children: 3   Years of education: 6   Highest education level: 6th grade  Occupational History   Not on file  Tobacco Use   Smoking status: Every Day    Packs/day: 0.40    Types: Cigarettes   Smokeless tobacco: Never   Tobacco comments:    Reports cutting back and trying to quit.  Vaping Use   Vaping Use: Never used  Substance and Sexual Activity   Alcohol use: Not Currently    Alcohol/week: 2.0 standard drinks    Types: 2 Standard  drinks or equivalent per week   Drug use: Not Currently    Types: "Crack" cocaine   Sexual activity: Yes    Partners: Male    Birth control/protection: None  Other Topics Concern   Not on file  Social History Narrative   Not on file   Social Determinants of Health   Financial Resource Strain: Not on file  Food Insecurity: Not on file  Transportation Needs: Not on file  Physical Activity: Not on file  Stress: Not on file  Social Connections: Not on file    Allergies:  Allergies  Allergen Reactions   Gadolinium Derivatives Nausea And Vomiting    Nausea and vomiting was followed by epileptic seizure episode that lasted approximately 5 minutes; pt was unable to verbally communicate during that time; she then came to and was able to speak; was evaluated by Rad and RN; kms    Grapefruit Flavor [Flavoring Agent] Other (See Comments)    Drug interaction    Metabolic Disorder Labs: Lab Results  Component Value Date   HGBA1C 5.4 09/25/2019   MPG 108.28 09/25/2019   MPG 120 02/07/2015   Lab Results  Component Value Date   PROLACTIN 8.1 10/30/2018   Lab Results  Component Value Date   CHOL 150 02/24/2021   TRIG 91 02/24/2021   HDL 49 02/24/2021   CHOLHDL 3.1 02/24/2021   VLDL 70 (H) 02/08/2015   LDLCALC 84 02/24/2021   LDLCALC 114 (H) 11/17/2020   Lab Results  Component Value Date   TSH 1.460 11/17/2020   TSH 5.147 (H) 09/25/2019    Therapeutic Level Labs: Lab Results  Component Value Date   LITHIUM 1.00 09/27/2019   LITHIUM 1.35 (H) 09/26/2019   No results found for: VALPROATE No components found for:  CBMZ  Current Medications: Current Outpatient Medications  Medication Sig Dispense Refill   amLODipine (NORVASC) 10 MG tablet Take 10 mg by mouth daily.     budesonide-formoterol (SYMBICORT) 160-4.5 MCG/ACT inhaler Inhale 2 puffs into the lungs in the morning and at bedtime. 10.2 g 11   furosemide (LASIX) 80 MG tablet Take 1 tablet (80 mg total) by mouth  daily. 90 tablet 3   gabapentin (NEURONTIN) 300 MG capsule Take 2 capsules by mouth in morning, 2 capsules in afternoon and 3 capsules at bedtime. 210 capsule 5   lisinopril (PRINIVIL,ZESTRIL) 20 MG tablet Take 20 mg by mouth 2 (two) times daily.  metFORMIN (GLUCOPHAGE) 500 MG tablet Take 500 mg by mouth 2 (two) times daily.  3   Polyethylene Glycol 3350 (PEG 3350) 17 GM/SCOOP POWD SMARTSIG:17 Gram(s) By Mouth 1-2 Times Daily     VENTOLIN HFA 108 (90 Base) MCG/ACT inhaler inhale 2 PUFFS into THE lungs EVERY 6 HOURS AS NEEDED FOR WHEEZING OR SHORTNESS OF BREATH 18 g 11   atorvastatin (LIPITOR) 20 MG tablet Take 1 tablet (20 mg total) by mouth daily. 90 tablet 3   busPIRone (BUSPAR) 15 MG tablet Take 1 tablet (15 mg total) by mouth 2 (two) times daily. 180 tablet 0   lithium carbonate (LITHOBID) 300 MG CR tablet Take 1 tablet (300 mg total) by mouth 2 (two) times daily. 180 tablet 0   meloxicam (MOBIC) 15 MG tablet Take 1 tablet (15 mg total) by mouth as needed. (Patient not taking: Reported on 05/14/2021) 10 tablet 0   methocarbamol (ROBAXIN) 750 MG tablet Take 750 mg by mouth 2 (two) times daily as needed. (Patient not taking: Reported on 06/25/2021)     nortriptyline (PAMELOR) 75 MG capsule Take 2 capsules (150 mg total) by mouth at bedtime. 180 capsule 0   valbenazine (INGREZZA) 40 MG capsule Take 1 capsule (40 mg total) by mouth daily. 90 capsule 0   No current facility-administered medications for this visit.     Musculoskeletal: Strength & Muscle Tone: decreased Gait & Station: normal Patient leans: Front  Psychiatric Specialty Exam: Review of Systems  Blood pressure 137/85, pulse 91, temperature 98.5 F (36.9 C), height 5' 4.75" (1.645 m), weight (!) 324 lb (147 kg), last menstrual period 05/29/2017, SpO2 94 %.Body mass index is 54.33 kg/m.  General Appearance: Casual  Eye Contact:  Good  Speech:  Clear and Coherent and Normal Rate  Volume:  Normal  Mood:  Anxious and Depressed   Affect:  Congruent  Thought Process:  Coherent and Descriptions of Associations: Intact, concrete  Orientation:  Full (Time, Place, and Person)  Thought Content: Logical   Suicidal Thoughts:  No  Homicidal Thoughts:  No  Memory:  Immediate;   Poor  Judgement:  Fair  Insight:  Shallow  Psychomotor Activity:  Normal  Concentration:  Concentration: Fair  Recall:  [employer] of Knowledge: Fair  Language: Fair  Akathisia:  No  Handed:  Right  AIMS (if indicated): not done  Assets:  Communication Skills Desire for Improvement Financial Resources/Insurance Housing Intimacy Social Support Talents/Skills Transportation Vocational/Educational  ADL's:  Intact  Cognition: WNL  Sleep:  Poor   Screenings: GAD-7    Flowsheet Row [facility]  Total GAD-7 Score [place] Office Visit from 04/14/2015 in [facility] [place]  Total Score (max 30 points ) 15      PHQ2-9    [place] Visit from 06/25/2021 in [facility] Video Visit from 05/14/2021 in [facility] Video Visit from 04/23/2021 in [facility] Office Visit from 02/26/2021 in [facility] Video Visit from 01/08/2021 in [facility]  PHQ-2 Total Score '5 3 6 6 4  '$ PHQ-9 Total Score '18 17 23 21 22      '$ [place] Office Visit from 06/25/2021 in [facility] Video Visit from 05/14/2021 in [facility] Video Visit from 04/23/2021 in [facility]  C-SSRS RISK CATEGORY No Risk No Risk No Risk        Assessment and Plan:    Medication management with supportive therapy. Risks and benefits, side effects and alternative  treatment options discussed with patient. Pt was given an opportunity to ask questions about medication, illness, and treatment. All current psychiatric medications have been reviewed and discussed with the patient and adjusted as clinically appropriate. The patient has been provided an accurate and updated list of the medications being now prescribed. Pt verbalized understanding and verbal consent obtained for treatment.   Status of current problems: worsening memory, depression and anxiety  Meds: restart Ingrezza '40mg'$  qD Increase Lithium to '300mg'$  BID- small increase due to concerns about her health Decrease Buspar to '15mg'$  BID due to reports SE of poor memory. I may decrease Nortriptyline if no improvement in memory.   Labs: reviewed labs done on 03/19/21- BUN and Creatinine WNL  Therapy: brief supportive therapy provided. Discussed psychosocial stressors in detail.      F/up in 1-3 weeks or sooner if needed  The duration of this appointment visit was 20 minutes of face-to-face time with the patient.  Greater than 50% of this time was spent in counseling, explanation of  diagnosis, planning of further management, and coordination of care    Collaboration of Care: Collaboration of Care: Other provider involved in patient's care AEB Neurologist regarding her memory  Patient/Guardian was advised Release of Information must be obtained prior to any record release in order to collaborate their care with an outside provider. Patient/Guardian was advised if they have not already done so to contact the registration department to sign all necessary forms in order for [place] to release information regarding their care.   Consent: Patient/Guardian gives verbal consent for treatment and assignment of benefits for services provided during this visit. Patient/Guardian expressed understanding and agreed to proceed.    Charlcie Cradle, MD 06/25/2021, 1:55 PM

## 2021-06-27 NOTE — Progress Notes (Signed)
Cardiology Office Note:    Date:  06/29/2021   ID:  Denise [name], DOB 29-Dec-1970, MRN [id number]  PCP:  [facility]   [facility] Providers Cardiologist:  Werner Lean, MD     CC: DOE f/u  History of Present Illness:    Denise [name] is a 51 y.o. female with a hx of Morbid Obesity, HTN with DM, Tobacco Abuse and seizures,  OSA, on CPAP and OHS, who presented for evaluation 06/12/20.  2022: Had negative stress test in 2022 and had increased her diuretic regimen.  Normal BNP 2023: Normal Duplex and ABIs, had atypical CP at April 2023 PCP visit.  Patient notes that she is doing similarly to prior.    Notes that she has chest pain always.  Does ADLS and cooks.  Came to her appointment. Had chest pain getting her dressed, in transferring into the transportation [place].  Her CP appears to be linked to transferring but is minimally active.  Doesn't know if her CP is any different that her 2022 testing.  No resting SOB.  Notes productive Cough that is chronic.  Notes DOE with minimal exertion.  Has changed inhalers with her pulmonologist.  One barrier to smoking that her husband and her friends all smoke.  Pre-contemplative.    Compliant on her CPAP There are no interval hospital/ED visit.    Past Medical History:  Diagnosis Date   Abnormal EKG    AD (atopic dermatitis)    Alcohol use 04/14/2015   Anxiety    Arthritis    Blood transfusion without reported diagnosis    Depression    Diabetes mellitus without complication ([place])    Fibroid uterus 12/18/2014   Hyperlipemia    Hypertension    Memory changes    Overactive bladder    Pelvic pain in female 12/18/2014   Prediabetes    Right sided weakness    Seizures ([place])    Stroke ([place])    Substance abuse ([place])    Syncope 02/07/2015   TIA (transient ischemic attack)    Tobacco use 04/14/2015    Past Surgical History:  Procedure Laterality Date   CESAREAN SECTION     x  2    Current Medications: Current Meds  Medication Sig   amLODipine (NORVASC) 10 MG tablet Take 10 mg by mouth daily.   atorvastatin (LIPITOR) 20 MG tablet Take 1 tablet (20 mg total) by mouth daily.   benztropine (COGENTIN) 1 MG tablet Take by mouth 2 (two) times daily.   budesonide-formoterol (SYMBICORT) 160-4.5 MCG/ACT inhaler Inhale 2 puffs into the lungs in the morning and at bedtime.   busPIRone (BUSPAR) 15 MG tablet Take 1 tablet (15 mg total) by mouth 2 (two) times daily.   fluticasone (FLONASE) 50 MCG/ACT nasal spray as needed.   furosemide (LASIX) 80 MG tablet Take 1 tablet (80 mg total) by mouth daily.   gabapentin (NEURONTIN) 300 MG capsule Take 2 capsules by mouth in morning, 2 capsules in afternoon and 3 capsules at bedtime.   Ibuprofen 200 MG CAPS as needed.   lisinopril (PRINIVIL,ZESTRIL) 20 MG tablet Take 20 mg by mouth 2 (two) times daily.    lithium carbonate (LITHOBID) 300 MG CR tablet Take 1 tablet (300 mg total) by mouth 2 (two) times daily.   metFORMIN (GLUCOPHAGE) 500 MG tablet Take 500 mg by mouth 2 (two) times daily.   nortriptyline (PAMELOR) 75 MG capsule Take 2 capsules (150 mg total) by mouth at bedtime.  oxybutynin (DITROPAN-XL) 10 MG 24 hr tablet Take by mouth daily at 6 (six) AM.   Polyethylene Glycol 3350 (PEG 3350) 17 GM/SCOOP POWD SMARTSIG:17 Gram(s) By Mouth 1-2 Times Daily   sertraline (ZOLOFT) 100 MG tablet 2 tablets daily at 6 (six) AM.   valbenazine (INGREZZA) 40 MG capsule Take 1 capsule (40 mg total) by mouth daily.   VENTOLIN HFA 108 (90 Base) MCG/ACT inhaler inhale 2 PUFFS into THE lungs EVERY 6 HOURS AS NEEDED FOR WHEEZING OR SHORTNESS OF BREATH   zolpidem (AMBIEN) 10 MG tablet Take by mouth as needed.     Allergies:   Gadolinium derivatives and Grapefruit flavor [flavoring agent]   Social History   Socioeconomic History   Marital status: Married    Spouse name: [name]   Number of children: 3   Years of education: 6   Highest  education level: 6th grade  Occupational History   Not on file  Tobacco Use   Smoking status: Every Day    Packs/day: 0.40    Types: Cigarettes   Smokeless tobacco: Never   Tobacco comments:    Reports cutting back and trying to quit.  Vaping Use   Vaping Use: Never used  Substance and Sexual Activity   Alcohol use: Not Currently    Alcohol/week: 2.0 standard drinks    Types: 2 Standard drinks or equivalent per week   Drug use: Not Currently    Types: "Crack" cocaine   Sexual activity: Yes    Partners: Male    Birth control/protection: None  Other Topics Concern   Not on file  Social History Narrative   Not on file   Social Determinants of Health   Financial Resource Strain: Not on file  Food Insecurity: Not on file  Transportation Needs: Not on file  Physical Activity: Not on file  Stress: Not on file  Social Connections: Not on file    Social: lives with husband (has medical problems), originally from [place], feels depressed, Son has come up from [facility]  Family History: The patient's family history includes Alcohol abuse in her mother; Anxiety disorder in her father and mother; Depression in her mother; Drug abuse in her father and mother; Heart disease in her maternal grandmother; Stroke in her mother. There is no history of Colon cancer, Esophageal cancer, Pancreatic cancer, Stomach cancer, Liver disease, or Breast cancer.  ROS:   Please see the history of present illness.     All other systems reviewed and are negative.  EKGs/Labs/Other Studies Reviewed:    The following studies were reviewed today:  EKG:   03/12/21: SR rate 98 Qtc 500, Borderline Ant Inf 06/11/20: SR 85 Borderline Ant Inf pattern 03/24/20- uninterpretable scan from PCP  Transthoracic Echocardiogram: Date: 06/06/20 Results:  1. Left ventricular ejection fraction, by estimation, is 55 to 60%. The  left ventricle has normal function. The left ventricle has no regional  wall motion abnormalities.  Left ventricular diastolic parameters are  consistent with Grade I diastolic  dysfunction (impaired relaxation).   2. Right ventricular systolic function is normal. The right ventricular  size is normal. Tricuspid regurgitation signal is inadequate for assessing  PA pressure.   3. The mitral valve is normal in structure. Trivial mitral valve  regurgitation. No evidence of mitral stenosis.   4. The aortic valve is tricuspid. Aortic valve regurgitation is trivial.  No aortic stenosis is present.   NM Stress Testing : Date: 07/22/20 Results: Nuclear stress EF: 58%. The left ventricular ejection  fraction is normal (55-65%). There was no ST segment deviation noted during stress. This is a low risk study. There is no evidence of ischemia and no evidence of previous infarction. The study is normal.    Recent Labs: 11/17/2020: Magnesium 1.9; TSH 1.460 02/01/2021: B Natriuretic Peptide 14.9; Hemoglobin 13.4; Platelets 365 02/24/2021: ALT 28 03/19/2021: BUN 10; Creatinine, Ser 0.73; NT-Pro BNP <36; Potassium 3.8; Sodium 141  Recent Lipid Panel    Component Value Date/Time   CHOL 150 02/24/2021 0720   TRIG 91 02/24/2021 0720   HDL 49 02/24/2021 0720   CHOLHDL 3.1 02/24/2021 0720   CHOLHDL 7.5 02/08/2015 0422   VLDL 70 (H) 02/08/2015 0422   LDLCALC 84 02/24/2021 0720   Physical Exam:    VS:  BP 130/68   Pulse 84   Ht '5\' 5"'$  (1.651 m)   Wt (!) 324 lb 12.8 oz (147.3 kg)   LMP 05/29/2017 (Approximate)   SpO2 92%   BMI 54.05 kg/m     Wt Readings from Last 3 Encounters:  06/29/21 (!) 324 lb 12.8 oz (147.3 kg)  03/18/21 (!) 330 lb (149.7 kg)  03/12/21 (!) 330 lb (149.7 kg)   Notes that she doesn't like to be touched.  She is amenable to exam   Gen: no distress, morbid obesity   Neck: No JVD Cardiac: No Rubs or Gallops, new systolic murmur over RUSB, RRR +2 radial pulses distant heart sounds Respiratory: Clear to auscultation bilaterally, normal effort, normal  respiratory rate GI:  Soft, nontender, non-distended  MS: non pitting edema,  moves all extremities Integument: Skin feels warm, ashy grey skin on leg Neuro:  At time of evaluation, alert and oriented to person/place/time/situation  Psych: Normal affect, patient feels ok   ASSESSMENT:    1. DOE (dyspnea on exertion)   2. Heart murmur, systolic   3. Diabetes mellitus with coincident hypertension ([place])   4. Chronic heart failure with preserved ejection fraction (HCC)   5. Hyperlipidemia associated with type 2 diabetes mellitus ([place])   6. OSA on CPAP   7. Tobacco use   8. QT prolongation     PLAN:    Atypical chest pain HFpEF and new systolic heart murmur Morbid Obesity HTN with DM Tobacco Abuse with seizure history HLD with DM OSA on CPAP and OHS Non syndromic Qtc prolongation  Non vascular Leg pain - continue norvasc 10 mg PO Daily, lisinopril 40 mg (patient takes it 20 mg PO BID) - continue atorvastatin 20 mg PO Daily; LDL now < 100 (84 in 2023) - lasix 80 mg PO daily - BNP and BMP, if elevated will add SGLT2i - discussed smoking cessation that she could take her husband - discussed [email or web address] - Most of her chest pain appears with transferring, if she has chest pain with her new exercise class, I have consented her for PET MPI  One year me or APP    Medication Adjustments/Labs and Tests Ordered: Current medicines are reviewed at length with the patient today.  Concerns regarding medicines are outlined above.  Orders Placed This Encounter  Procedures   Basic metabolic panel   Pro b natriuretic peptide (BNP)   ECHOCARDIOGRAM COMPLETE    No orders of the defined types were placed in this encounter.    Patient Instructions  Medication Instructions:  Your physician recommends that you continue on your current medications as directed. Please refer to the Current Medication list given to you today.  *If you need a refill on your  cardiac medications before your next  appointment, please call your pharmacy*   Lab Work: TODAY: BNP, BMP If you have labs (blood work) drawn today and your tests are completely normal, you will receive your results only by: [place] (if you have MyChart) OR A paper copy in the mail If you have any lab test that is abnormal or we need to change your treatment, we will call you to review the results.   Testing/Procedures: Your physician has requested that you have an echocardiogram. Echocardiography is a painless test that uses sound waves to create images of your heart. It provides your doctor with information about the size and shape of your heart and how well your heart's chambers and valves are working. This procedure takes approximately one hour. There are no restrictions for this procedure.    Follow-Up: At [facility], you and your health needs are our priority.  As part of our continuing mission to provide you with exceptional heart care, we have created designated Provider Care Teams.  These Care Teams include your primary Cardiologist (physician) and Advanced Practice Providers (APPs -  Physician Assistants and Nurse Practitioners) who all work together to provide you with the care you need, when you need it.  We recommend signing up for the patient portal called "MyChart".  Sign up information is provided on this After Visit Summary.  MyChart is used to connect with patients for Virtual Visits (Telemedicine).  Patients are able to view lab/test results, encounter notes, upcoming appointments, etc.  Non-urgent messages can be sent to your provider as well.   To learn more about what you can do with MyChart, go to [email or web address].    Your next appointment:   1 year(s)  The format for your next appointment:   In Person  Provider:   Werner Lean, MD    Important Information About Sugar         Signed, Werner Lean, MD  06/29/2021 8:52 AM    [place]

## 2021-06-29 ENCOUNTER — Encounter: Payer: Self-pay | Admitting: Internal Medicine

## 2021-06-29 ENCOUNTER — Ambulatory Visit: Payer: Medicaid Other | Admitting: Internal Medicine

## 2021-06-29 VITALS — BP 130/68 | HR 84 | Ht 65.0 in | Wt 324.8 lb

## 2021-06-29 DIAGNOSIS — R011 Cardiac murmur, unspecified: Secondary | ICD-10-CM | POA: Diagnosis not present

## 2021-06-29 DIAGNOSIS — R0609 Other forms of dyspnea: Secondary | ICD-10-CM

## 2021-06-29 DIAGNOSIS — G4733 Obstructive sleep apnea (adult) (pediatric): Secondary | ICD-10-CM

## 2021-06-29 DIAGNOSIS — I5032 Chronic diastolic (congestive) heart failure: Secondary | ICD-10-CM

## 2021-06-29 DIAGNOSIS — E119 Type 2 diabetes mellitus without complications: Secondary | ICD-10-CM

## 2021-06-29 DIAGNOSIS — E662 Morbid (severe) obesity with alveolar hypoventilation: Secondary | ICD-10-CM

## 2021-06-29 DIAGNOSIS — E1169 Type 2 diabetes mellitus with other specified complication: Secondary | ICD-10-CM

## 2021-06-29 DIAGNOSIS — I10 Essential (primary) hypertension: Secondary | ICD-10-CM

## 2021-06-29 DIAGNOSIS — R9431 Abnormal electrocardiogram [ECG] [EKG]: Secondary | ICD-10-CM

## 2021-06-29 DIAGNOSIS — Z9989 Dependence on other enabling machines and devices: Secondary | ICD-10-CM

## 2021-06-29 DIAGNOSIS — E785 Hyperlipidemia, unspecified: Secondary | ICD-10-CM

## 2021-06-29 DIAGNOSIS — F4010 Social phobia, unspecified: Secondary | ICD-10-CM

## 2021-06-29 DIAGNOSIS — Z720 Tobacco use: Secondary | ICD-10-CM

## 2021-06-29 HISTORY — DX: Abnormal electrocardiogram (ECG) (EKG): R94.31

## 2021-06-29 HISTORY — DX: Cardiac murmur, unspecified: R01.1

## 2021-06-29 NOTE — Patient Instructions (Signed)
Medication Instructions:  Your physician recommends that you continue on your current medications as directed. Please refer to the Current Medication list given to you today.  *If you need a refill on your cardiac medications before your next appointment, please call your pharmacy*   Lab Work: TODAY: BNP, BMP If you have labs (blood work) drawn today and your tests are completely normal, you will receive your results only by: [place] (if you have MyChart) OR A paper copy in the mail If you have any lab test that is abnormal or we need to change your treatment, we will call you to review the results.   Testing/Procedures: Your physician has requested that you have an echocardiogram. Echocardiography is a painless test that uses sound waves to create images of your heart. It provides your doctor with information about the size and shape of your heart and how well your heart's chambers and valves are working. This procedure takes approximately one hour. There are no restrictions for this procedure.    Follow-Up: At [facility], you and your health needs are our priority.  As part of our continuing mission to provide you with exceptional heart care, we have created designated Provider Care Teams.  These Care Teams include your primary Cardiologist (physician) and Advanced Practice Providers (APPs -  Physician Assistants and Nurse Practitioners) who all work together to provide you with the care you need, when you need it.  We recommend signing up for the patient portal called "MyChart".  Sign up information is provided on this After Visit Summary.  MyChart is used to connect with patients for Virtual Visits (Telemedicine).  Patients are able to view lab/test results, encounter notes, upcoming appointments, etc.  Non-urgent messages can be sent to your provider as well.   To learn more about what you can do with MyChart, go to [email or web address].    Your next appointment:   1  year(s)  The format for your next appointment:   In Person  Provider:   Werner Lean, MD    Important Information About Sugar

## 2021-06-30 LAB — BASIC METABOLIC PANEL
BUN/Creatinine Ratio: 6 — ABNORMAL LOW (ref 9–23)
BUN: 5 mg/dL — ABNORMAL LOW (ref 6–24)
CO2: 27 mmol/L (ref 20–29)
Calcium: 10.2 mg/dL (ref 8.7–10.2)
Chloride: 99 mmol/L (ref 96–106)
Creatinine, Ser: 0.82 mg/dL (ref 0.57–1.00)
Glucose: 85 mg/dL (ref 70–99)
Potassium: 4.1 mmol/L (ref 3.5–5.2)
Sodium: 142 mmol/L (ref 134–144)
eGFR: 86 mL/min/{1.73_m2} (ref >59)

## 2021-06-30 LAB — PRO B NATRIURETIC PEPTIDE: NT-Pro BNP: <36 pg/mL (ref 0–249)

## 2021-07-07 ENCOUNTER — Ambulatory Visit: Payer: Medicaid Other | Attending: Audiologist | Admitting: Audiologist

## 2021-07-07 DIAGNOSIS — H905 Unspecified sensorineural hearing loss: Secondary | ICD-10-CM | POA: Diagnosis present

## 2021-07-07 NOTE — Procedures (Signed)
  Outpatient Audiology and [address], [place]  [id number] [phone]  AUDIOLOGICAL  EVALUATION  NAME: Denise [name]   DOB:   Jul 11, 1970      MRN: [id number]                                                                                     DATE: 07/07/2021     REFERENT: [facility] STATUS: Outpatient DIAGNOSIS: Profound Sensorineural Hearing Loss Bilaterally    History: Denise [name] was seen for an audiological evaluation.  Denise [name] is receiving a hearing evaluation due to concerns for difficulty hearing for many years. Denise [name] does not remember when her difficulty hearing started, just that she cannot hear and its been many years. No pain or pressure reported in either ear. Tinnitus present in both ears. She does not know if she has family history of hearing loss. Denise [name] has a complex medical history. She had chornic heart failure, diabetes with hypertension, memory changes, psychogenic nonepileptic seizure in 2019, major depressive disorder with a recent severe episode. No other relevant case history reported. Denise [name] had difficulty recalling her own medical history. She does not remember ever having a hearing test before. She does not think she has ever worn hearing aids.   Evaluation:  Otoscopy showed a clear view of the tympanic membranes, bilaterally Tympanometry results were consistent with normal middle ear function, bilaterally   Audiometric testing was completed using conventional audiometry with supraural and insert transducer. Speech Recognition Thresholds were  consistent with pure tone averages, had to use live voice with slow presentation. SRT 85dB in the left ear and 90dB in the right ear. Word Recognition was presented using slow live voice at 90dB, scored 0% in the left ear and 20% in the right ear. Pure tone thresholds show profound sensorineural hearing loss in both ears. No flinching or reaction to  105dB tone presentation. 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 She has no useable hearing and is relying on lipreading.   Results:  The test results were reviewed with Denise [name]. Pure tone thresholds show profound sensorineural hearing loss in both ears. 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 She has no functional hearing and is relying on lipreading. Denise [name] agreed that she can only tell what people are saying if she can see their face. She says this is exhausting and is a huge strain on a daily basis.   Recommendations: Referral to ENT Physician necessary due to profound hearing loss bilaterally. Recommend retest of hearing and consultation for cochlear implant if results are consistent.  Recommend any tests of cognitive ability be reconsidered as, according to patient, they were done without amplification. It is unknown how long patient has been deaf.    39 minutes spent testing and counseling on results.   Denise [name]  Audiologist, Au.D., CCC-A 07/07/2021  8:30 AM  Cc: [facility]

## 2021-07-16 ENCOUNTER — Telehealth (HOSPITAL_BASED_OUTPATIENT_CLINIC_OR_DEPARTMENT_OTHER): Payer: Medicaid Other | Admitting: Psychiatry

## 2021-07-16 DIAGNOSIS — F4312 Post-traumatic stress disorder, chronic: Secondary | ICD-10-CM | POA: Diagnosis not present

## 2021-07-16 DIAGNOSIS — G4701 Insomnia due to medical condition: Secondary | ICD-10-CM

## 2021-07-16 DIAGNOSIS — F333 Major depressive disorder, recurrent, severe with psychotic symptoms: Secondary | ICD-10-CM | POA: Diagnosis not present

## 2021-07-16 DIAGNOSIS — F411 Generalized anxiety disorder: Secondary | ICD-10-CM

## 2021-07-16 DIAGNOSIS — G2589 Other specified extrapyramidal and movement disorders: Secondary | ICD-10-CM

## 2021-07-16 MED ORDER — ZOLPIDEM TARTRATE 5 MG PO TABS: 5.0000 mg | ORAL_TABLET | Freq: Every evening | ORAL | 0 refills | Status: DC | PRN

## 2021-07-16 NOTE — Progress Notes (Signed)
Virtual Visit via Phone Note  I connected with Denise [name] on 07/16/21 at  9:45 AM EDT by phone as Denise [name] does not have a smart phone or intenet to do a video enabled telemedicine application. I verified that I am speaking with the correct person using two identifiers.  Location: Patient: home Provider: office   I discussed the limitations of evaluation and management by telemedicine and the availability of in person appointments. The patient expressed understanding and agreed to proceed.  History of Present Illness: Denise [name], Kalena's husband, shares that Denise [name] is getting worse. She sleeps for over 14-15 hrs straight and then naps during the day. She is not engaging in activities that she used to like. Her memory is getting a little better. Her temper is a little shorter. She told him the medicine causes fatigue and weight gain. Denise [name] shares that all this is the truth but Denise [name] does not want him to tell me all this. He is happy she has not had a seizure in a long time.  Denise [name] shares that what Denise [name] said is partly right. She doesn't know what part. She is not sure if she is sleeping as long as he said or about her temper or improvement in her memory. Her memory is not better or worse. She does admit to anhedonia. Her depression is worse. The level is more intense. Her anxiety seems a little worse. Denise [name] is always tired and doesn't know how long she is sleeping. Hear appetite is poor but she is drinking a lot of water. She denies SI/HI. The muscle movements have not improved with Ingrezza.      Observations/Objective: Psychiatric Specialty Exam:  General Appearance: unable to assess  Eye Contact:  unable to assess  Speech:  Clear and Coherent and Normal Rate  Volume:  Normal  Mood:  Anxious and Depressed  Affect:  Congruent and irritable  Thought Process:  slow, concrete Coherent and Descriptions of Associations: Intact  Orientation:  Full (Time, Place, and Person)   Thought Content:  Logical  Suicidal Thoughts:  No  Homicidal Thoughts:  No  Memory:  Immediate;   Poor  Judgement:  Fair  Insight:  Shallow  Psychomotor Activity: unable to assess  Concentration:  Concentration: Fair  Recall:  Poor  Fund of Knowledge:  Poor  Language:  Fair  Akathisia:  unable to assess  Handed:  unable to assess  AIMS (if indicated):     Assets:  Desire for Improvement Financial Resources/Insurance Housing Intimacy Leisure Time Talents/Skills Vocational/Educational  ADL's:  unable to assess  Cognition:  WNL  Sleep:         Assessment and Plan:     06/25/2021    1:45 PM 05/14/2021    8:44 AM 04/23/2021    9:09 AM 02/26/2021   10:18 AM 01/08/2021   10:00 AM  Depression screen PHQ 2/9  Decreased Interest 2 0 '3 3 2  '$ Down, Depressed, Hopeless '3 3 3 3 2  '$ PHQ - 2 Score '5 3 6 6 4  '$ Altered sleeping '2 3 2 3 3  '$ Tired, decreased energy '3 3 3 3 3  '$ Change in appetite '2 3 3 3 3  '$ Feeling bad or failure about yourself  '3 2 3 3 3  '$ Trouble concentrating '3 3 3 3 3  '$ Moving slowly or fidgety/restless 0 0 3 0 3  Suicidal thoughts 0 0 0 0 0  PHQ-9 Score '18 17 23 21 22  '$ Difficult doing work/chores Extremely dIfficult Extremely dIfficult Extremely  dIfficult Extremely dIfficult Very difficult    [place] Office Visit from 06/25/2021 in [facility] Video Visit from 05/14/2021 in [facility] Video Visit from 04/23/2021 in [place] No Risk No Risk No Risk       It seemed as though Denise [name] is unhappy that her husband shared his thoughts with me.   Status of current problems: worsening depression and anxiety  Meds: I am holding off on starting anything new as she is on a large number of sedating meds and is sleeping over 15 hrs/day.   D/c Lithium - she reports worsening depression  D/C Cogentin I may d/c Ingrezza if she has  no improvement on EPS symptoms  Decrease Ambien '5mg'$  po qHS- in effort to decrease fatigue, number of hours she is sleeping and improve memory  1. Severe episode of recurrent major depressive disorder, with psychotic features ([place])  2. Chronic post-traumatic stress disorder (PTSD)  3. GAD (generalized anxiety disorder)  4. Insomnia due to medical condition - zolpidem (AMBIEN) 5 MG tablet; Take 1 tablet (5 mg total) by mouth at bedtime as needed.  Dispense: 30 tablet; Refill: 0  5. Combined pyramidal-extrapyramidal syndrome       Therapy: brief supportive therapy provided.     Collaboration of Care: Other none  Patient/Guardian was advised Release of Information must be obtained prior to any record release in order to collaborate their care with an outside provider. Patient/Guardian was advised if they have not already done so to contact the registration department to sign all necessary forms in order for [place] to release information regarding their care.   Consent: Patient/Guardian gives verbal consent for treatment and assignment of benefits for services provided during this visit. Patient/Guardian expressed understanding and agreed to proceed.     Follow Up Instructions: Follow up in 2 weeks or sooner if needed    I discussed the assessment and treatment plan with the patient. The patient was provided an opportunity to ask questions and all were answered. The patient agreed with the plan and demonstrated an understanding of the instructions.   The patient was advised to call back or seek an in-person evaluation if the symptoms worsen or if the condition fails to improve as anticipated.  I provided 11 minutes of non-face-to-face time during this encounter.   Charlcie Cradle, MD

## 2021-07-17 ENCOUNTER — Ambulatory Visit (HOSPITAL_COMMUNITY): Payer: Medicaid Other | Attending: Cardiology

## 2021-07-17 DIAGNOSIS — R011 Cardiac murmur, unspecified: Secondary | ICD-10-CM | POA: Insufficient documentation

## 2021-07-17 LAB — ECHOCARDIOGRAM COMPLETE
Area-P 1/2: 3.83 cm2
S' Lateral: 2.9 cm

## 2021-07-22 NOTE — Progress Notes (Unsigned)
NEUROLOGY FOLLOW UP OFFICE NOTE  Judene [name] [id number]  Assessment/Plan:   Scalp neuralgia   Gabapentin to '600mg'$  in AM, '600mg'$  in afternoon and '900mg'$  at bedtime.  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   UPDATE: Increased gabapentin in August..   Still has sharp pain from time to time but much improved.  Occurs 3 times a day for 5-6 minutes.       Current NSAIDS:  none Current analgesics:  none Current anti-anxiolytic:  buspirone Current Antihypertensive medications:  Amlodipine, lisinopril, HCTZ Current Antidepressant/antipsychotic medications:  nortriptyline '150mg'$  at bedtime,  Current Anticonvulsant medications:  Gabapentin '600mg'$ /'600mg'$ /'900mg'$  Other medications:  [name], BuSpar, Cogentin (for combined pyramidal-extrapyramidal syndrome)     HISTORY:  She developed headaches following hospitalization in late August 2021 for lithium toxicity presenting with confusion and headaches.  CT of head personally reviewed showed partially empty sella and known old left basal ganglia infarct but no acute intracranial abnormality.  She reports that the headaches have not improved since treatment.  She gets the headaches daily, about 2 hours a day.  It feels like that her entire top of head (in cap distribution) is being stabbed with hot pokers.  She also endorses a hot pressure sticking her in her left eye.  Also a pressure on top of head.  She reports associated drooling off of the left side of her mouth.  Separate from the headaches, she reports that she has periodic blurred vision.  She also zones out from time to time.  She has increased difficulty speaking and slurs her speech.  She has  trouble just functioning and performing physical tasks.  MRI of brain without contrast on 11/05/2019 showed nonspecific chronic scattered white matter changes with chronic left basal ganglia infarcts and remote pontine microhemorrhage     Past NSAIDS:  Ibuprofen, meloxicam Past analgesics:  tramadol Past antidepressant medications:  Fluoxetine, citalopram, mirtazepine, Sertraline '200mg'$ , [name]  Past anticonvulsant medications:  topiramate '50mg'$   PAST MEDICAL HISTORY: Past Medical History:  Diagnosis Date   Abnormal EKG    AD (atopic dermatitis)    Alcohol use 04/14/2015   Anxiety    Arthritis    Blood transfusion without reported diagnosis    Depression    Diabetes mellitus without complication ([place])    Fibroid uterus 12/18/2014   Hyperlipemia    Hypertension    Memory changes    Overactive bladder    Pelvic pain in female 12/18/2014   Prediabetes    Right sided weakness    Seizures ([place])    Stroke ([place])    Substance abuse ([place])    Syncope 02/07/2015   TIA (transient ischemic attack)    Tobacco use 04/14/2015    MEDICATIONS: Current Outpatient Medications on File Prior to Visit  Medication Sig Dispense Refill   amLODipine (NORVASC) 10 MG tablet Take 10 mg by mouth daily.     atorvastatin (LIPITOR) 20 MG tablet Take 1 tablet (20 mg total) by mouth daily. 90 tablet 3   budesonide-formoterol (SYMBICORT) 160-4.5 MCG/ACT inhaler Inhale 2 puffs into the lungs in the morning and at bedtime. 10.2 g 11   busPIRone (BUSPAR) 15 MG tablet Take 1 tablet (15 mg total) by mouth 2 (two) times daily. 180 tablet 0   fluticasone (  FLONASE) 50 MCG/ACT nasal spray as needed. (Patient not taking: Reported on 07/16/2021)     furosemide (LASIX) 80 MG tablet Take 1 tablet (80 mg total) by mouth daily. 90 tablet 3   gabapentin (NEURONTIN) 300 MG capsule Take 2 capsules by mouth in morning, 2 capsules in afternoon and 3 capsules at bedtime. 210 capsule 5   Ibuprofen 200 MG CAPS as needed.      lisinopril (PRINIVIL,ZESTRIL) 20 MG tablet Take 20 mg by mouth 2 (two) times daily.      metFORMIN (GLUCOPHAGE) 500 MG tablet Take 500 mg by mouth 2 (two) times daily.  3   nortriptyline (PAMELOR) 75 MG capsule Take 2 capsules (150 mg total) by mouth at bedtime. 180 capsule 0   oxybutynin (DITROPAN-XL) 10 MG 24 hr tablet Take by mouth daily at 6 (six) AM.     Polyethylene Glycol 3350 (PEG 3350) 17 GM/SCOOP POWD SMARTSIG:17 Gram(s) By Mouth 1-2 Times Daily     sertraline (ZOLOFT) 100 MG tablet 2 tablets daily at 6 (six) AM.     valbenazine (INGREZZA) 40 MG capsule Take 1 capsule (40 mg total) by mouth daily. 90 capsule 0   VENTOLIN HFA 108 (90 Base) MCG/ACT inhaler inhale 2 PUFFS into THE lungs EVERY 6 HOURS AS NEEDED FOR WHEEZING OR SHORTNESS OF BREATH 18 g 11   zolpidem (AMBIEN) 5 MG tablet Take 1 tablet (5 mg total) by mouth at bedtime as needed. 30 tablet 0   No current facility-administered medications on file prior to visit.    ALLERGIES: Allergies  Allergen Reactions   Gadolinium Derivatives Nausea And Vomiting    Nausea and vomiting was followed by epileptic seizure episode that lasted approximately 5 minutes; pt was unable to verbally communicate during that time; she then came to and was able to speak; was evaluated by Rad and RN; kms    Grapefruit Flavor [Flavoring Agent] Other (See Comments)    Drug interaction    FAMILY HISTORY: Family History  Problem Relation Age of Onset   Stroke Mother    Anxiety disorder Mother    Drug abuse Mother    Alcohol abuse Mother    Depression Mother    Drug abuse Father    Anxiety disorder Father    Heart disease Maternal Grandmother    Colon cancer Neg Hx    Esophageal cancer Neg Hx    Pancreatic cancer Neg Hx    Stomach cancer Neg Hx    Liver disease Neg Hx    Breast cancer Neg Hx       Objective:  *** General: No acute distress.  Patient appears ***-groomed.   Head:  Normocephalic/atraumatic Eyes:  Fundi examined but not  visualized Neck: supple, no paraspinal tenderness, full range of motion Heart:  Regular rate and rhythm Lungs:  Clear to auscultation bilaterally Back: No paraspinal tenderness Neurological Exam: alert and oriented to person, place, and time.  Speech fluent and not dysarthric, language intact.  CN II-XII intact. Bulk and tone normal, muscle strength 5/5 throughout.  Sensation to light touch intact.  Deep tendon reflexes 2+ throughout, toes downgoing.  Finger to nose testing intact.  Gait normal, Romberg negative.   Metta Clines, DO

## 2021-07-23 ENCOUNTER — Ambulatory Visit (INDEPENDENT_AMBULATORY_CARE_PROVIDER_SITE_OTHER): Payer: Medicaid Other | Admitting: Neurology

## 2021-07-23 ENCOUNTER — Other Ambulatory Visit (INDEPENDENT_AMBULATORY_CARE_PROVIDER_SITE_OTHER): Payer: Medicaid Other

## 2021-07-23 ENCOUNTER — Encounter: Payer: Self-pay | Admitting: Neurology

## 2021-07-23 VITALS — BP 139/79 | HR 91 | Ht 65.0 in | Wt 327.8 lb

## 2021-07-23 DIAGNOSIS — F0280 Dementia in other diseases classified elsewhere without behavioral disturbance: Secondary | ICD-10-CM | POA: Diagnosis not present

## 2021-07-23 DIAGNOSIS — F445 Conversion disorder with seizures or convulsions: Secondary | ICD-10-CM | POA: Diagnosis not present

## 2021-07-23 DIAGNOSIS — M792 Neuralgia and neuritis, unspecified: Secondary | ICD-10-CM | POA: Diagnosis not present

## 2021-07-23 LAB — VITAMIN B12: Vitamin B-12: 245 pg/mL (ref 211–911)

## 2021-07-23 MED ORDER — GABAPENTIN 300 MG PO CAPS: 900.0000 mg | ORAL_CAPSULE | Freq: Three times a day (TID) | ORAL | 5 refills | Status: DC

## 2021-07-23 NOTE — Patient Instructions (Signed)
Increase gabapentin '300mg'$  to 3 pills three times daily for headache To further evaluate memory problems: MRI of brain without contrast Check B12 and RPR 3.  Follow up 6 months.    I don't think the memory problems are related to disease such as Alzheimer's - likely related to history of mini strokes.

## 2021-07-23 NOTE — Addendum Note (Signed)
Addended by: Venetia Night on: 07/23/2021 10:04 AM   Modules accepted: Orders

## 2021-07-25 LAB — FLUORESCENT TREPONEMAL AB(FTA)-IGG-BLD: Fluorescent Treponemal ABS: REACTIVE — AB

## 2021-07-25 LAB — RPR TITER: RPR Titer: 1:2 {titer} — ABNORMAL HIGH

## 2021-07-25 LAB — RPR: RPR Ser Ql: REACTIVE — AB

## 2021-07-27 NOTE — Progress Notes (Signed)
Patient advised of her lab results.

## 2021-07-28 ENCOUNTER — Inpatient Hospital Stay (HOSPITAL_COMMUNITY)
Admission: EM | Admit: 2021-07-28 | Discharge: 2021-08-01 | DRG: 291 | Disposition: A | Discharge status: Home Health | Payer: Medicaid Other | Attending: Internal Medicine | Admitting: Internal Medicine

## 2021-07-28 ENCOUNTER — Encounter (HOSPITAL_COMMUNITY): Payer: Self-pay

## 2021-07-28 ENCOUNTER — Other Ambulatory Visit: Payer: Self-pay

## 2021-07-28 ENCOUNTER — Emergency Department (HOSPITAL_COMMUNITY): Payer: Medicaid Other

## 2021-07-28 DIAGNOSIS — E119 Type 2 diabetes mellitus without complications: Secondary | ICD-10-CM | POA: Insufficient documentation

## 2021-07-28 DIAGNOSIS — E785 Hyperlipidemia, unspecified: Secondary | ICD-10-CM | POA: Diagnosis present

## 2021-07-28 DIAGNOSIS — G4733 Obstructive sleep apnea (adult) (pediatric): Secondary | ICD-10-CM | POA: Diagnosis present

## 2021-07-28 DIAGNOSIS — G8929 Other chronic pain: Secondary | ICD-10-CM | POA: Diagnosis present

## 2021-07-28 DIAGNOSIS — E1169 Type 2 diabetes mellitus with other specified complication: Secondary | ICD-10-CM | POA: Diagnosis present

## 2021-07-28 DIAGNOSIS — R635 Abnormal weight gain: Secondary | ICD-10-CM | POA: Diagnosis present

## 2021-07-28 DIAGNOSIS — M545 Low back pain, unspecified: Secondary | ICD-10-CM | POA: Diagnosis present

## 2021-07-28 DIAGNOSIS — I110 Hypertensive heart disease with heart failure: Principal | ICD-10-CM | POA: Diagnosis present

## 2021-07-28 DIAGNOSIS — F32A Depression, unspecified: Secondary | ICD-10-CM | POA: Diagnosis present

## 2021-07-28 DIAGNOSIS — F17210 Nicotine dependence, cigarettes, uncomplicated: Secondary | ICD-10-CM | POA: Diagnosis present

## 2021-07-28 DIAGNOSIS — J9621 Acute and chronic respiratory failure with hypoxia: Secondary | ICD-10-CM | POA: Diagnosis present

## 2021-07-28 DIAGNOSIS — Z791 Long term (current) use of non-steroidal anti-inflammatories (NSAID): Secondary | ICD-10-CM | POA: Diagnosis not present

## 2021-07-28 DIAGNOSIS — E66813 Obesity, class 3: Secondary | ICD-10-CM

## 2021-07-28 DIAGNOSIS — I10 Essential (primary) hypertension: Secondary | ICD-10-CM | POA: Diagnosis present

## 2021-07-28 DIAGNOSIS — Z9981 Dependence on supplemental oxygen: Secondary | ICD-10-CM | POA: Diagnosis not present

## 2021-07-28 DIAGNOSIS — Z79899 Other long term (current) drug therapy: Secondary | ICD-10-CM

## 2021-07-28 DIAGNOSIS — Z8673 Personal history of transient ischemic attack (TIA), and cerebral infarction without residual deficits: Secondary | ICD-10-CM

## 2021-07-28 DIAGNOSIS — Z9989 Dependence on other enabling machines and devices: Secondary | ICD-10-CM | POA: Diagnosis not present

## 2021-07-28 DIAGNOSIS — Z823 Family history of stroke: Secondary | ICD-10-CM | POA: Diagnosis not present

## 2021-07-28 DIAGNOSIS — R609 Edema, unspecified: Secondary | ICD-10-CM

## 2021-07-28 DIAGNOSIS — R600 Localized edema: Secondary | ICD-10-CM

## 2021-07-28 DIAGNOSIS — Z6841 Body Mass Index (BMI) 40.0 and over, adult: Secondary | ICD-10-CM | POA: Diagnosis not present

## 2021-07-28 DIAGNOSIS — M549 Dorsalgia, unspecified: Secondary | ICD-10-CM

## 2021-07-28 DIAGNOSIS — Z818 Family history of other mental and behavioral disorders: Secondary | ICD-10-CM

## 2021-07-28 DIAGNOSIS — F332 Major depressive disorder, recurrent severe without psychotic features: Secondary | ICD-10-CM

## 2021-07-28 DIAGNOSIS — Z8249 Family history of ischemic heart disease and other diseases of the circulatory system: Secondary | ICD-10-CM

## 2021-07-28 DIAGNOSIS — G4700 Insomnia, unspecified: Secondary | ICD-10-CM | POA: Diagnosis present

## 2021-07-28 DIAGNOSIS — Z7984 Long term (current) use of oral hypoglycemic drugs: Secondary | ICD-10-CM

## 2021-07-28 DIAGNOSIS — F419 Anxiety disorder, unspecified: Secondary | ICD-10-CM | POA: Diagnosis present

## 2021-07-28 DIAGNOSIS — I5032 Chronic diastolic (congestive) heart failure: Secondary | ICD-10-CM | POA: Diagnosis present

## 2021-07-28 DIAGNOSIS — I5033 Acute on chronic diastolic (congestive) heart failure: Secondary | ICD-10-CM | POA: Diagnosis present

## 2021-07-28 DIAGNOSIS — J9601 Acute respiratory failure with hypoxia: Secondary | ICD-10-CM

## 2021-07-28 DIAGNOSIS — Z888 Allergy status to other drugs, medicaments and biological substances status: Secondary | ICD-10-CM | POA: Diagnosis not present

## 2021-07-28 DIAGNOSIS — J449 Chronic obstructive pulmonary disease, unspecified: Secondary | ICD-10-CM | POA: Diagnosis present

## 2021-07-28 DIAGNOSIS — R0902 Hypoxemia: Principal | ICD-10-CM

## 2021-07-28 DIAGNOSIS — Z7951 Long term (current) use of inhaled steroids: Secondary | ICD-10-CM | POA: Diagnosis not present

## 2021-07-28 DIAGNOSIS — Z9102 Food additives allergy status: Secondary | ICD-10-CM

## 2021-07-28 DIAGNOSIS — F4010 Social phobia, unspecified: Secondary | ICD-10-CM | POA: Diagnosis present

## 2021-07-28 LAB — COMPREHENSIVE METABOLIC PANEL
ALT: 23 U/L (ref 0–44)
AST: 19 U/L (ref 15–41)
Albumin: 3.6 g/dL (ref 3.5–5.0)
Alkaline Phosphatase: 107 U/L (ref 38–126)
Anion gap: 16 — ABNORMAL HIGH (ref 5–15)
BUN: 5 mg/dL — ABNORMAL LOW (ref 6–20)
CO2: 24 mmol/L (ref 22–32)
Calcium: 9.3 mg/dL (ref 8.9–10.3)
Chloride: 103 mmol/L (ref 98–111)
Creatinine, Ser: 0.83 mg/dL (ref 0.44–1.00)
GFR, Estimated: >60 mL/min (ref >60)
Glucose, Bld: 99 mg/dL (ref 70–99)
Potassium: 3.5 mmol/L (ref 3.5–5.1)
Sodium: 143 mmol/L (ref 135–145)
Total Bilirubin: 0.3 mg/dL (ref 0.3–1.2)
Total Protein: 6.7 g/dL (ref 6.5–8.1)

## 2021-07-28 LAB — D-DIMER, QUANTITATIVE: D-Dimer, Quant: 0.37 ug/mL-FEU (ref 0.00–0.50)

## 2021-07-28 LAB — CBC
HCT: 41.3 % (ref 36.0–46.0)
Hemoglobin: 13.2 g/dL (ref 12.0–15.0)
MCH: 28.8 pg (ref 26.0–34.0)
MCHC: 32 g/dL (ref 30.0–36.0)
MCV: 90.2 fL (ref 80.0–100.0)
Platelets: 358 10*3/uL (ref 150–400)
RBC: 4.58 MIL/uL (ref 3.87–5.11)
RDW: 14.9 % (ref 11.5–15.5)
WBC: 11.9 10*3/uL — ABNORMAL HIGH (ref 4.0–10.5)
nRBC: 0 % (ref 0.0–0.2)

## 2021-07-28 LAB — GLUCOSE, CAPILLARY: Glucose-Capillary: 133 mg/dL — ABNORMAL HIGH (ref 70–99)

## 2021-07-28 LAB — BRAIN NATRIURETIC PEPTIDE: B Natriuretic Peptide: 6.3 pg/mL (ref 0.0–100.0)

## 2021-07-28 MED ORDER — BUSPIRONE HCL 5 MG PO TABS
15.0000 mg | ORAL_TABLET | Freq: Two times a day (BID) | ORAL | Status: DC
  Administered 2021-07-29 – 2021-08-01 (×8): 15 mg via ORAL
  Filled 2021-07-28 (×8): qty 3

## 2021-07-28 MED ORDER — NORTRIPTYLINE HCL 25 MG PO CAPS
150.0000 mg | ORAL_CAPSULE | Freq: Every day | ORAL | Status: DC
  Administered 2021-07-29 – 2021-07-31 (×4): 150 mg via ORAL
  Filled 2021-07-28 (×5): qty 6

## 2021-07-28 MED ORDER — NICOTINE 14 MG/24HR TD PT24
14.0000 mg | MEDICATED_PATCH | Freq: Every day | TRANSDERMAL | Status: DC
Start: 2021-07-29 — End: 2021-08-01
  Administered 2021-07-29 – 2021-08-01 (×5): 14 mg via TRANSDERMAL
  Filled 2021-07-28 (×5): qty 1

## 2021-07-28 MED ORDER — FUROSEMIDE 10 MG/ML IJ SOLN
60.0000 mg | Freq: Two times a day (BID) | INTRAMUSCULAR | Status: DC
  Administered 2021-07-29: 60 mg via INTRAVENOUS
  Filled 2021-07-28: qty 6

## 2021-07-28 MED ORDER — INSULIN ASPART 100 UNIT/ML IJ SOLN
0.0000 [IU] | Freq: Three times a day (TID) | INTRAMUSCULAR | Status: DC
  Administered 2021-07-29 – 2021-07-30 (×2): 1 [IU] via SUBCUTANEOUS
  Administered 2021-07-31: 2 [IU] via SUBCUTANEOUS

## 2021-07-28 MED ORDER — ALBUTEROL SULFATE HFA 108 (90 BASE) MCG/ACT IN AERS
2.0000 | INHALATION_SPRAY | Freq: Four times a day (QID) | RESPIRATORY_TRACT | Status: DC | PRN
Start: 2021-07-28 — End: 2021-07-28

## 2021-07-28 MED ORDER — ACETAMINOPHEN 325 MG PO TABS
650.0000 mg | ORAL_TABLET | Freq: Four times a day (QID) | ORAL | Status: DC | PRN
  Administered 2021-07-29: 650 mg via ORAL
  Filled 2021-07-28: qty 2

## 2021-07-28 MED ORDER — LISINOPRIL 20 MG PO TABS
20.0000 mg | ORAL_TABLET | Freq: Two times a day (BID) | ORAL | Status: DC
  Administered 2021-07-29: 20 mg via ORAL
  Filled 2021-07-28: qty 1

## 2021-07-28 MED ORDER — POLYETHYLENE GLYCOL 3350 17 G PO PACK
17.0000 g | PACK | Freq: Every day | ORAL | Status: DC | PRN
  Filled 2021-07-28: qty 1

## 2021-07-28 MED ORDER — ENOXAPARIN SODIUM 40 MG/0.4ML IJ SOSY
40.0000 mg | PREFILLED_SYRINGE | INTRAMUSCULAR | Status: DC
  Administered 2021-07-29: 40 mg via SUBCUTANEOUS
  Filled 2021-07-28: qty 0.4

## 2021-07-28 MED ORDER — SODIUM CHLORIDE 0.9% FLUSH
3.0000 mL | Freq: Two times a day (BID) | INTRAVENOUS | Status: DC
  Administered 2021-07-29 – 2021-07-31 (×6): 3 mL via INTRAVENOUS

## 2021-07-28 MED ORDER — MELOXICAM 7.5 MG PO TABS
15.0000 mg | ORAL_TABLET | Freq: Every day | ORAL | Status: DC
  Administered 2021-07-29 – 2021-08-01 (×4): 15 mg via ORAL
  Filled 2021-07-28 (×4): qty 2

## 2021-07-28 MED ORDER — AMLODIPINE BESYLATE 10 MG PO TABS
10.0000 mg | ORAL_TABLET | Freq: Every day | ORAL | Status: DC
  Administered 2021-07-29 – 2021-08-01 (×4): 10 mg via ORAL
  Filled 2021-07-28 (×4): qty 1

## 2021-07-28 MED ORDER — FUROSEMIDE 10 MG/ML IJ SOLN
80.0000 mg | Freq: Once | INTRAMUSCULAR | Status: AC
  Administered 2021-07-28: 80 mg via INTRAVENOUS
  Filled 2021-07-28: qty 8

## 2021-07-28 MED ORDER — ALBUTEROL SULFATE (2.5 MG/3ML) 0.083% IN NEBU: 2.5000 mg | INHALATION_SOLUTION | Freq: Four times a day (QID) | RESPIRATORY_TRACT | Status: DC | PRN

## 2021-07-28 MED ORDER — INSULIN ASPART 100 UNIT/ML IJ SOLN: 0.0000 [IU] | Freq: Every day | INTRAMUSCULAR | Status: DC

## 2021-07-28 MED ORDER — POTASSIUM CHLORIDE CRYS ER 20 MEQ PO TBCR
20.0000 meq | EXTENDED_RELEASE_TABLET | Freq: Once | ORAL | Status: AC
  Administered 2021-07-29: 20 meq via ORAL
  Filled 2021-07-28: qty 1

## 2021-07-28 MED ORDER — ZOLPIDEM TARTRATE 5 MG PO TABS
5.0000 mg | ORAL_TABLET | Freq: Every evening | ORAL | Status: DC | PRN
  Administered 2021-07-29 – 2021-07-30 (×3): 5 mg via ORAL
  Filled 2021-07-28 (×3): qty 1

## 2021-07-28 MED ORDER — ATORVASTATIN CALCIUM 10 MG PO TABS
20.0000 mg | ORAL_TABLET | Freq: Every day | ORAL | Status: DC
  Administered 2021-07-29 – 2021-08-01 (×4): 20 mg via ORAL
  Filled 2021-07-28 (×4): qty 2

## 2021-07-28 MED ORDER — GABAPENTIN 300 MG PO CAPS
900.0000 mg | ORAL_CAPSULE | Freq: Three times a day (TID) | ORAL | Status: DC
  Administered 2021-07-29 – 2021-08-01 (×11): 900 mg via ORAL
  Filled 2021-07-28 (×11): qty 3

## 2021-07-28 MED ORDER — MOMETASONE FURO-FORMOTEROL FUM 200-5 MCG/ACT IN AERO
2.0000 | INHALATION_SPRAY | Freq: Two times a day (BID) | RESPIRATORY_TRACT | Status: DC
  Administered 2021-07-29 – 2021-08-01 (×6): 2 via RESPIRATORY_TRACT
  Filled 2021-07-28 (×3): qty 8.8

## 2021-07-28 MED ORDER — ACETAMINOPHEN 650 MG RE SUPP: 650.0000 mg | Freq: Four times a day (QID) | RECTAL | Status: DC | PRN

## 2021-07-28 NOTE — ED Provider Notes (Signed)
[place] EMERGENCY DEPARTMENT Provider Note   CSN: [id number] Arrival date & time: 07/28/21  1514     History  Chief Complaint  Patient presents with   Leg Swelling    Denise [name] is a 51 y.o. female.  HPI Patient presenting for evaluation of shortness of breath and leg swelling.  The foot swelling causes pain with walking. She also does note some hand swelling.  She was transferred by EMS and found her to have an oxygen saturation of 84%.  This normalized with nasal cannula oxygen.  She reports she is taking her usual medicines and continues to smoke cigarettes.  She denies fever, chills, nausea or vomiting.  She has a cough that is producing green sputum currently.  She denies weakness or dizziness.   Cardiology evaluation in the office on 06/29/2021.  This time it is noted that she has chronic chest pain and recent negative stress test in 2022 with prior reassuring cardiac echo.  Since that time she has had another cardiac echo, which was done 2 weeks ago.  Ascending had a normal ejection fraction and structurally normal right and left ventricles.  She has a history of QT prolongation.  She takes Lasix daily, 80 mg.  She is an ongoing smoker.   Home Medications Prior to Admission medications   Medication Sig Start Date End Date Taking? Authorizing Provider  amLODipine (NORVASC) 10 MG tablet Take 10 mg by mouth daily.    [provider]  atorvastatin (LIPITOR) 20 MG tablet Take 1 tablet (20 mg total) by mouth daily. 11/20/20   Werner Lean, MD  budesonide-formoterol (SYMBICORT) 160-4.5 MCG/ACT inhaler Inhale 2 puffs into the lungs in the morning and at bedtime. 03/02/21   Chesley Mires, MD  busPIRone (BUSPAR) 15 MG tablet Take 1 tablet (15 mg total) by mouth 2 (two) times daily. 06/25/21   Charlcie Cradle, MD  fluticasone [facility]) 50 MCG/ACT nasal spray as needed. Patient not taking: Reported on 07/16/2021 10/20/20   [provider]  furosemide (LASIX) 80 MG tablet Take 1 tablet (80 mg total) by mouth daily. 03/12/21   Chandrasekhar, Terisa Starr, MD  gabapentin (NEURONTIN) 300 MG capsule Take 3 capsules (900 mg total) by mouth 3 (three) times daily. 07/23/21   Pieter Partridge, DO  lisinopril (PRINIVIL,ZESTRIL) 20 MG tablet Take 20 mg by mouth 2 (two) times daily.     [provider]  metFORMIN (GLUCOPHAGE) 500 MG tablet Take 500 mg by mouth 2 (two) times daily. 05/25/17   [provider]  nortriptyline (PAMELOR) 75 MG capsule Take 2 capsules (150 mg total) by mouth at bedtime. 06/25/21 06/25/22  Charlcie Cradle, MD  oxybutynin (DITROPAN-XL) 10 MG 24 hr tablet Take by mouth daily at 6 (six) AM.    [provider]  Polyethylene Glycol 3350 (PEG 3350) 17 GM/SCOOP POWD SMARTSIG:17 Gram(s) By Mouth 1-2 Times Daily 11/17/20   [provider]  sertraline (ZOLOFT) 100 MG tablet 2 tablets daily at 6 (six) AM.    [provider]  valbenazine (INGREZZA) 40 MG capsule Take 1 capsule (40 mg total) by mouth daily. 06/25/21   Charlcie Cradle, MD  VENTOLIN HFA 108 [phone] Base) MCG/ACT inhaler inhale 2 PUFFS into THE lungs EVERY 6 HOURS AS NEEDED FOR WHEEZING OR SHORTNESS OF BREATH 04/15/21   Chesley Mires, MD  zolpidem (AMBIEN) 5 MG tablet Take 1 tablet (5 mg total) by mouth at bedtime as needed. 07/16/21   Charlcie Cradle, MD  Allergies    Gadolinium derivatives and Grapefruit flavor [flavoring agent]    Review of Systems   Review of Systems  Physical Exam Updated Vital Signs BP 121/61   Pulse 89   Temp 98.7 F (37.1 C) (Oral)   Resp 17   Ht '5\' 5"'$  (1.651 m)   Wt (!) 151.5 kg   LMP 05/29/2017 (Approximate)   SpO2 95%   BMI 55.58 kg/m  Physical Exam Vitals and nursing note reviewed.  Constitutional:      General: She is not in acute distress.    Appearance: She is well-developed. She is obese. She is not ill-appearing, toxic-appearing or diaphoretic.  HENT:     Head: Normocephalic and  atraumatic.     Right Ear: External ear normal.     Left Ear: External ear normal.  Eyes:     Conjunctiva/sclera: Conjunctivae normal.     Pupils: Pupils are equal, round, and reactive to light.  Neck:     Trachea: Phonation normal.  Cardiovascular:     Rate and Rhythm: Normal rate and regular rhythm.     Heart sounds: Normal heart sounds.  Pulmonary:     Effort: Pulmonary effort is normal.     Breath sounds: Normal breath sounds.  Abdominal:     Palpations: Abdomen is soft.     Tenderness: There is no abdominal tenderness.  Musculoskeletal:        General: Normal range of motion.     Cervical back: Normal range of motion and neck supple.     Right lower leg: Edema present.     Left lower leg: Edema present.     Comments: Chronic appearing lower extremity edema  Skin:    General: Skin is warm and dry.  Neurological:     Mental Status: She is alert and oriented to person, place, and time.     Cranial Nerves: No cranial nerve deficit.     Sensory: No sensory deficit.     Motor: No abnormal muscle tone.     Coordination: Coordination normal.  Psychiatric:        Behavior: Behavior normal.        Thought Content: Thought content normal.        Judgment: Judgment normal.     ED Results / Procedures / Treatments   Labs (all labs ordered are listed, but only abnormal results are displayed) Labs Reviewed  CBC - Abnormal; Notable for the following components:      Result Value   WBC 11.9 (*)    All other components within normal limits  COMPREHENSIVE METABOLIC PANEL - Abnormal; Notable for the following components:   BUN 5 (*)    Anion gap 16 (*)    All other components within normal limits  BRAIN NATRIURETIC PEPTIDE  D-DIMER, QUANTITATIVE    EKG None  Radiology DG Chest 2 View  Result Date: 07/28/2021 CLINICAL DATA:  shortness of breath, bilateral leg and hand swelling x 2 days. EXAM: CHEST - 2 VIEW COMPARISON:  Cxr 02/24/21 FINDINGS: The heart and mediastinal  contours are unchanged. Aortic calcification. No focal consolidation. No pulmonary edema. No pleural effusion. No pneumothorax. No acute osseous abnormality. IMPRESSION: 1. No active cardiopulmonary disease. 2.  Aortic Atherosclerosis (ICD10-I70.0). Electronically Signed   By: Iven Finn M.D.   On: 07/28/2021 16:01    Procedures Procedures    Medications Ordered in ED Medications  furosemide (LASIX) injection 80 mg (80 mg Intravenous Given 07/28/21 1733)    ED  Course/ Medical Decision Making/ A&P Clinical Course as of 07/28/21 1959  Tue Jul 28, 2021  1719 Patient was monitored off oxygen, and in about 20 minutes her oxygenation gradually decreased to 86%.  At this time she took 5 deep breaths and within a minute her saturations were 95%.  They then they administered back to 90%.  She was then put on 2 L nasal cannula oxygen by me.  Oxygen saturation improved to 95% within a couple of minutes.  We will maintain her on oxygen by nasal cannula at this time.  She is lying comfortably and not in respiratory distress. [EW]    Clinical Course User Index [EW] Daleen Bo, MD                           Medical Decision Making Patient presenting with lower extremity edema which is painful, treated by EMS during transport with oxygen because of hypoxia at the scene.  Patient reports mild shortness of breath.  She has had a cough productive of green sputum recently.  She is an ongoing cigarette smoker.  She follows up regularly with her medical providers.  Problems Addressed: Hypoxia: acute illness or injury    Details: Unclear cause, negative chest x-ray, normal BNP and D-dimer. Peripheral edema: chronic illness or injury with exacerbation, progression, or side effects of treatment    Details: 10 pound weight gain Weight gain: acute illness or injury    Details: 10 pound weight gain over the last month.  Amount and/or Complexity of Data Reviewed Independent Historian: parent    Details:  She is a cogent historian External Data Reviewed: notes.    Details: Cardiology evaluation in the office on 06/29/2021.  This time it is noted that she has chronic chest pain and recent negative stress test in 2022 with prior reassuring cardiac echo.  Since that time she has had another cardiac echo, which was done 2 weeks ago.  Ascending had a normal ejection fraction and structurally normal right and left ventricles.  She has a history of QT prolongation.  She takes Lasix daily, 80 mg.  She is an ongoing smoker. Labs: ordered.    Details: BC, metabolic panel, D-dimer, BNP -- normal except white, mildly elevated Radiology: ordered and independent interpretation performed.    Details: Chest x-ray -- no infiltrate or edema Discussion of management or test interpretation with external provider(s): Consult, unassigned medical admitting team-admission arranged  Risk Prescription drug management. Decision regarding hospitalization. Risk Details: Patient with painful peripheral edema has gained 10 pounds over the last month, despite taking Lasix regularly.  She is obese with a very high BMI.  I suspect she is not adequately observing Lasix, and requires parenteral treatment at this time.  No CHF on most recent echo done 2 weeks ago.  Hemodynamically stable in the ED.  She has been coughing and producing green sputum, is a smoker, and likely has bronchitis.  D-dimer ordered because of hypoxia without abnormal chest x-ray.  BMP ordered as baseline.  Both of these tests are normal.  Patient requires hospitalization for management of hypoxia and diuresis, to improve fluid status.  Doubt ACS/MI, pneumonia, PE or acute congestive heart failure.  Critical Care Total time providing critical care: 40 minutes           Final Clinical Impression(s) / ED Diagnoses Final diagnoses:  Hypoxia  Peripheral edema  Weight gain  Class 3 severe obesity with serious comorbidity and body  mass index (BMI) of 50.0 to  59.9 in adult, unspecified obesity type [facility])    Rx / DC Orders ED Discharge Orders     None         Daleen Bo, MD 07/28/21 [phone]

## 2021-07-28 NOTE — ED Provider Triage Note (Signed)
Emergency Medicine Provider Triage Evaluation Note  Denise [name] , a 51 y.o. female  was evaluated in triage.  Pt complains of shortness of breath, bilateral leg and hand swelling x 2 days. Dyspneic on exertion. EMS noted 84% on RA, placed her on 3 L. Improved to normal saturation. EMS notes she was sitting outside in the heat in front of a charcoal grill when they got there.   Review of Systems  Positive: As above Negative: CP, cough  Physical Exam  BP 105/89 (BP Location: Left Arm)   Pulse 99   Temp 98.7 F (37.1 C) (Oral)   Resp (!) 22   Ht '5\' 5"'$  (1.651 m)   Wt (!) 148.7 kg   LMP 05/29/2017 (Approximate)   SpO2 92%   BMI 54.55 kg/m  Gen:   Awake, no distress   Resp:  Normal effort  MSK:   Moves extremities without difficulty  Other:    Medical Decision Making  Medically screening exam initiated at 3:31 PM.  Appropriate orders placed.  Denise [name] was informed that the remainder of the evaluation will be completed by another provider, this initial triage assessment does not replace that evaluation, and the importance of remaining in the ED until their evaluation is complete.  Workup initiated   Kateri Plummer, PA-C 07/28/21 1531

## 2021-07-28 NOTE — ED Triage Notes (Signed)
Pt arrived via [facility] from edema of legs bilat and hands bilat,, dyspnea w/exertionx2 days.

## 2021-07-28 NOTE — H&P (Signed)
History and Physical    Denise [name] DQQ:[id number] DOB: [birth year] DOA: 07/28/2021  PCP: [facility]   Patient coming from: Home   Chief Complaint: Leg swelling and SOB   HPI: Denise [name] is a pleasant 51 y.o. female with medical history significant for hypertension, type 2 diabetes mellitus, chronic leg swelling, depression, anxiety, OSA on CPAP, and neuralgia, now presenting with increased leg swelling and shortness of breath.  Patient reports long-term problems with memory which limits her ability to provide history but states that her swelling in both legs has worsened, and she has been progressively short of breath.  She reports a cough that is sometimes productive of scant sputum.  She also has had some central chest pain that is worse with deep breath.  No fever or chills.  ED Course: Upon arrival to the ED, patient is found to be afebrile and saturating 96% on room air with tachypnea, mild tachycardia, and stable blood pressure.  Basic blood work is unremarkable, BNP is normal, had no acute findings noted on chest x-ray.  She is in sinus rhythm on EKG.  She was given 80 mg IV Lasix and placed on supplemental oxygen.  Review of Systems:  All other systems reviewed and apart from HPI, are negative.  Past Medical History:  Diagnosis Date   Abnormal EKG    AD (atopic dermatitis)    Alcohol use 04/14/2015   Anxiety    Arthritis    Blood transfusion without reported diagnosis    Depression    Diabetes mellitus without complication ([place])    Fibroid uterus 12/18/2014   Hyperlipemia    Hypertension    Memory changes    Overactive bladder    Pelvic pain in female 12/18/2014   Prediabetes    Right sided weakness    Seizures ([place])    Stroke [facility])    Substance abuse ([place])    Syncope 02/07/2015   TIA (transient ischemic attack)    Tobacco use 04/14/2015    Past Surgical History:  Procedure Laterality Date   CESAREAN SECTION      x 2    Social History:   reports that she has been smoking cigarettes. She has been smoking an average of .4 packs per day. She has never used smokeless tobacco. She reports that she does not currently use alcohol after a past usage of about 2.0 standard drinks of alcohol per week. She reports that she does not currently use drugs after having used the following drugs: "Crack" cocaine.  Allergies  Allergen Reactions   Gadolinium Derivatives Nausea And Vomiting    Nausea and vomiting was followed by epileptic seizure episode that lasted approximately 5 minutes; pt was unable to verbally communicate during that time; she then came to and was able to speak; was evaluated by Rad and RN; kms    Grapefruit Flavor [Flavoring Agent] Other (See Comments)    Drug interaction    Family History  Problem Relation Age of Onset   Stroke Mother    Anxiety disorder Mother    Drug abuse Mother    Alcohol abuse Mother    Depression Mother    Drug abuse Father    Anxiety disorder Father    Heart disease Maternal Grandmother    Colon cancer Neg Hx    Esophageal cancer Neg Hx    Pancreatic cancer Neg Hx    Stomach cancer Neg Hx    Liver disease Neg Hx    Breast  cancer Neg Hx      Prior to Admission medications   Medication Sig Start Date End Date Taking? Authorizing Provider  amLODipine (NORVASC) 10 MG tablet Take 10 mg by mouth daily.   Yes [provider]  atorvastatin (LIPITOR) 20 MG tablet Take 1 tablet (20 mg total) by mouth daily. 11/20/20  Yes Chandrasekhar, Mahesh A, MD  budesonide-formoterol (SYMBICORT) 160-4.5 MCG/ACT inhaler Inhale 2 puffs into the lungs in the morning and at bedtime. 03/02/21  Yes Chesley Mires, MD  busPIRone (BUSPAR) 15 MG tablet Take 1 tablet (15 mg total) by mouth 2 (two) times daily. 06/25/21  Yes Charlcie Cradle, MD  furosemide (LASIX) 80 MG tablet Take 1 tablet (80 mg total) by mouth daily. 03/12/21  Yes Chandrasekhar, Mahesh A, MD  gabapentin (NEURONTIN)  300 MG capsule Take 3 capsules (900 mg total) by mouth 3 (three) times daily. 07/23/21  Yes Jaffe, Adam R, DO  lisinopril (PRINIVIL,ZESTRIL) 20 MG tablet Take 20 mg by mouth 2 (two) times daily.    Yes [provider]  meloxicam (MOBIC) 15 MG tablet Take 15 mg by mouth daily. 07/23/21  Yes [provider]  metFORMIN (GLUCOPHAGE) 500 MG tablet Take 500 mg by mouth 2 (two) times daily. 05/25/17  Yes [provider]  nortriptyline (PAMELOR) 75 MG capsule Take 2 capsules (150 mg total) by mouth at bedtime. 06/25/21 06/25/22 Yes Charlcie Cradle, MD  Polyethylene Glycol 3350 (PEG 3350) 17 GM/SCOOP POWD Take 17 g by mouth daily as needed (constipation). 11/17/20  Yes [provider]  VENTOLIN HFA 108 (90 Base) MCG/ACT inhaler inhale 2 PUFFS into THE lungs EVERY 6 HOURS AS NEEDED FOR WHEEZING OR SHORTNESS OF BREATH 04/15/21  Yes Chesley Mires, MD  zolpidem (AMBIEN) 5 MG tablet Take 1 tablet (5 mg total) by mouth at bedtime as needed. Patient taking differently: Take 5 mg by mouth at bedtime as needed for sleep. 07/16/21  Yes Charlcie Cradle, MD  valbenazine [facility]) 40 MG capsule Take 1 capsule (40 mg total) by mouth daily. Patient not taking: Reported on 07/28/2021 06/25/21   Charlcie Cradle, MD    Physical Exam: Vitals:   07/28/21 1648 07/28/21 1819 07/28/21 1845 07/28/21 2212  BP:  (!) 148/75 121/61 127/75  Pulse:  (!) 102 89 91  Resp:  (!) '24 17 20  '$ Temp:    97.9 F (36.6 C)  TempSrc:    Oral  SpO2:  95% 95% 97%  Weight: (!) 151.5 kg     Height:        Constitutional: NAD, calm  Eyes: PERTLA, lids and conjunctivae normal ENMT: Mucous membranes are moist. Posterior pharynx clear of any exudate or lesions.   Neck: supple, no masses  Respiratory: no wheezing, no crackles. No accessory muscle use.  Cardiovascular: S1 & S2 heard, regular rate and rhythm. Pretibial edema bilaterally.  Abdomen: No distension, no tenderness, soft. Bowel sounds active.  Musculoskeletal:  no clubbing / cyanosis. No joint deformity upper and lower extremities.   Skin: no significant rashes, lesions, ulcers. Warm, dry, well-perfused. Neurologic: CN 2-12 grossly intact. Moving all extremities. Alert and oriented.  Psychiatric: Pleasant. Cooperative.    Labs and Imaging on Admission: I have personally reviewed following labs and imaging studies  CBC: Recent Labs  Lab 07/28/21 1535  WBC 11.9*  HGB 13.2  HCT 41.3  MCV 90.2  PLT 035   Basic Metabolic Panel: Recent Labs  Lab 07/28/21 1535  NA 143  K 3.5  CL 103  CO2 24  GLUCOSE 99  BUN 5*  CREATININE 0.83  CALCIUM 9.3   GFR: Estimated Creatinine Clearance: 118.7 mL/min (by C-G formula based on SCr of 0.83 mg/dL). Liver Function Tests: Recent Labs  Lab 07/28/21 1535  AST 19  ALT 23  ALKPHOS 107  BILITOT 0.3  PROT 6.7  ALBUMIN 3.6   No results for input(s): "LIPASE", "AMYLASE" in the last 168 hours. No results for input(s): "AMMONIA" in the last 168 hours. Coagulation Profile: No results for input(s): "INR", "PROTIME" in the last 168 hours. Cardiac Enzymes: No results for input(s): "CKTOTAL", "CKMB", "CKMBINDEX", "TROPONINI" in the last 168 hours. BNP (last 3 results) Recent Labs    03/19/21 0728 06/29/21 0857  PROBNP <36 <36   HbA1C: No results for input(s): "HGBA1C" in the last 72 hours. CBG: Recent Labs  Lab 07/28/21 2218  GLUCAP 133*   Lipid Profile: No results for input(s): "CHOL", "HDL", "LDLCALC", "TRIG", "CHOLHDL", "LDLDIRECT" in the last 72 hours. Thyroid Function Tests: No results for input(s): "TSH", "T4TOTAL", "FREET4", "T3FREE", "THYROIDAB" in the last 72 hours. Anemia Panel: No results for input(s): "VITAMINB12", "FOLATE", "FERRITIN", "TIBC", "IRON", "RETICCTPCT" in the last 72 hours. Urine analysis:    Component Value Date/Time   COLORURINE YELLOW 02/01/2021 1130   APPEARANCEUR CLEAR 02/01/2021 1130   LABSPEC 1.020 02/01/2021 1130   PHURINE 6.0 02/01/2021 1130    GLUCOSEU NEGATIVE 02/01/2021 1130   HGBUR NEGATIVE 02/01/2021 1130   BILIRUBINUR NEGATIVE 02/01/2021 1130   KETONESUR 5 (A) 02/01/2021 1130   PROTEINUR NEGATIVE 02/01/2021 1130   UROBILINOGEN 0.2 11/22/2014 0240   NITRITE NEGATIVE 02/01/2021 1130   LEUKOCYTESUR NEGATIVE 02/01/2021 1130   Sepsis Labs: '@LABRCNTIP'$ (procalcitonin:4,lacticidven:4) )No results found for this or any previous visit (from the past 240 hour(s)).   Radiological Exams on Admission: DG Chest 2 View  Result Date: 07/28/2021 CLINICAL DATA:  shortness of breath, bilateral leg and hand swelling x 2 days. EXAM: CHEST - 2 VIEW COMPARISON:  Cxr 02/24/21 FINDINGS: The heart and mediastinal contours are unchanged. Aortic calcification. No focal consolidation. No pulmonary edema. No pleural effusion. No pneumothorax. No acute osseous abnormality. IMPRESSION: 1. No active cardiopulmonary disease. 2.  Aortic Atherosclerosis (ICD10-I70.0). Electronically Signed   By: Iven Finn M.D.   On: 07/28/2021 16:01    EKG: Independently reviewed. Sinus rhythm, QTc 399 ms.   Assessment/Plan   1. Acute hypoxic respiratory failure  - Presents with increased leg swelling and SOB and found to have saturations in mid-80s on rm air while at rest  - No acute findings on CXR, no wheezing   - She reports pleuritic chest pain, will check CTA chest, continue supplemental O2    2. Leg swelling  - Presents with worsening in her chronic b/l leg swelling, noted to be up 10 lbs in the past month  - She had TTE on 07/17/21 with normal EF, normal diastolic function, normal RV systolic function  - Bilateral LE dopplers negative for DVT in January 2023  - Albumin normal  - Given 80 mg IV Lasix in ED  - Continue diuresis, monitor I/O and weight    3. Type II DM  - A1c was 6.7% in September 2022  - Check CBGs and use SSI for now    4. Hypertension  - Continue Norvasc and lisinopril    5. Depression, anxiety, insomnia  - Continue home regimen  6.  OSA   - Continue CPAP qHS    DVT prophylaxis: Lovenox  Code Status: Full  Level of Care: Level of care: Telemetry Cardiac Family Communication: None present  Disposition Plan:  Patient is from: home  Anticipated d/c is to: Home  Anticipated d/c date is: 07/30/21  Patient currently: Pending CT chest, improved oxygenation or home O2  Consults called: none  Admission status: Inpatient     Vianne Bulls, MD [facility]  07/28/2021, 11:11 PM

## 2021-07-29 ENCOUNTER — Telehealth (HOSPITAL_COMMUNITY): Payer: Self-pay | Admitting: Pharmacy Technician

## 2021-07-29 ENCOUNTER — Other Ambulatory Visit (HOSPITAL_COMMUNITY): Payer: Self-pay

## 2021-07-29 ENCOUNTER — Inpatient Hospital Stay (HOSPITAL_COMMUNITY): Payer: Medicaid Other

## 2021-07-29 DIAGNOSIS — M549 Dorsalgia, unspecified: Secondary | ICD-10-CM

## 2021-07-29 DIAGNOSIS — I10 Essential (primary) hypertension: Secondary | ICD-10-CM

## 2021-07-29 DIAGNOSIS — G4733 Obstructive sleep apnea (adult) (pediatric): Secondary | ICD-10-CM

## 2021-07-29 DIAGNOSIS — Z9989 Dependence on other enabling machines and devices: Secondary | ICD-10-CM

## 2021-07-29 DIAGNOSIS — I5032 Chronic diastolic (congestive) heart failure: Secondary | ICD-10-CM

## 2021-07-29 DIAGNOSIS — I5033 Acute on chronic diastolic (congestive) heart failure: Secondary | ICD-10-CM

## 2021-07-29 DIAGNOSIS — F4010 Social phobia, unspecified: Secondary | ICD-10-CM | POA: Diagnosis not present

## 2021-07-29 HISTORY — DX: Chronic diastolic (congestive) heart failure: I50.32

## 2021-07-29 HISTORY — DX: Dorsalgia, unspecified: M54.9

## 2021-07-29 LAB — CBC
HCT: 41.8 % (ref 36.0–46.0)
Hemoglobin: 13.2 g/dL (ref 12.0–15.0)
MCH: 28.4 pg (ref 26.0–34.0)
MCHC: 31.6 g/dL (ref 30.0–36.0)
MCV: 90.1 fL (ref 80.0–100.0)
Platelets: 332 10*3/uL (ref 150–400)
RBC: 4.64 MIL/uL (ref 3.87–5.11)
RDW: 14.8 % (ref 11.5–15.5)
WBC: 9.2 10*3/uL (ref 4.0–10.5)
nRBC: 0 % (ref 0.0–0.2)

## 2021-07-29 LAB — BASIC METABOLIC PANEL
Anion gap: 11 (ref 5–15)
BUN: 8 mg/dL (ref 6–20)
CO2: 28 mmol/L (ref 22–32)
Calcium: 8.9 mg/dL (ref 8.9–10.3)
Chloride: 104 mmol/L (ref 98–111)
Creatinine, Ser: 0.78 mg/dL (ref 0.44–1.00)
GFR, Estimated: >60 mL/min (ref >60)
Glucose, Bld: 115 mg/dL — ABNORMAL HIGH (ref 70–99)
Potassium: 3.8 mmol/L (ref 3.5–5.1)
Sodium: 143 mmol/L (ref 135–145)

## 2021-07-29 LAB — MAGNESIUM: Magnesium: 2.1 mg/dL (ref 1.7–2.4)

## 2021-07-29 LAB — HEMOGLOBIN A1C
Hgb A1c MFr Bld: 6.5 % — ABNORMAL HIGH (ref 4.8–5.6)
Mean Plasma Glucose: 139.85 mg/dL

## 2021-07-29 LAB — GLUCOSE, CAPILLARY
Glucose-Capillary: 126 mg/dL — ABNORMAL HIGH (ref 70–99)
Glucose-Capillary: 158 mg/dL — ABNORMAL HIGH (ref 70–99)
Glucose-Capillary: 131 mg/dL — ABNORMAL HIGH (ref 70–99)
Glucose-Capillary: 163 mg/dL — ABNORMAL HIGH (ref 70–99)

## 2021-07-29 LAB — HIV ANTIBODY (ROUTINE TESTING W REFLEX): HIV Screen 4th Generation wRfx: NONREACTIVE

## 2021-07-29 MED ORDER — FUROSEMIDE 20 MG PO TABS: 20.0000 mg | ORAL_TABLET | Freq: Every day | ORAL | Status: DC

## 2021-07-29 MED ORDER — OXYCODONE HCL 5 MG PO TABS
5.0000 mg | ORAL_TABLET | ORAL | Status: DC | PRN
  Administered 2021-07-29 – 2021-08-01 (×5): 5 mg via ORAL
  Filled 2021-07-29 (×4): qty 1

## 2021-07-29 MED ORDER — IOHEXOL 350 MG/ML SOLN
50.0000 mL | Freq: Once | INTRAVENOUS | Status: AC | PRN
  Administered 2021-07-29: 50 mL via INTRAVENOUS

## 2021-07-29 MED ORDER — ENOXAPARIN SODIUM 80 MG/0.8ML IJ SOSY
75.0000 mg | PREFILLED_SYRINGE | INTRAMUSCULAR | Status: DC
  Administered 2021-07-30 – 2021-08-01 (×3): 75 mg via SUBCUTANEOUS
  Filled 2021-07-29 (×3): qty 0.8

## 2021-07-29 MED ORDER — EMPAGLIFLOZIN 10 MG PO TABS
10.0000 mg | ORAL_TABLET | Freq: Every day | ORAL | Status: DC
  Administered 2021-07-29 – 2021-08-01 (×4): 10 mg via ORAL
  Filled 2021-07-29 (×4): qty 1

## 2021-07-29 MED ORDER — LOSARTAN POTASSIUM 25 MG PO TABS
25.0000 mg | ORAL_TABLET | Freq: Every day | ORAL | Status: DC
  Administered 2021-07-30 – 2021-08-01 (×3): 25 mg via ORAL
  Filled 2021-07-29 (×3): qty 1

## 2021-07-29 MED ORDER — FUROSEMIDE 20 MG PO TABS
20.0000 mg | ORAL_TABLET | Freq: Every day | ORAL | Status: DC
Start: 2021-07-30 — End: 2021-07-30
  Administered 2021-07-30: 20 mg via ORAL
  Filled 2021-07-29: qty 1

## 2021-07-29 MED ORDER — POTASSIUM CHLORIDE CRYS ER 20 MEQ PO TBCR
40.0000 meq | EXTENDED_RELEASE_TABLET | Freq: Once | ORAL | Status: AC
  Administered 2021-07-29: 40 meq via ORAL
  Filled 2021-07-29: qty 2

## 2021-07-29 MED ORDER — ORAL CARE MOUTH RINSE: 15.0000 mL | OROMUCOSAL | Status: DC | PRN

## 2021-07-29 NOTE — Assessment & Plan Note (Signed)
Calculated BMI is 55.

## 2021-07-29 NOTE — Assessment & Plan Note (Addendum)
Continue glucose cover and monitoring with insulin sliding scale.   Continue with atorvastatin.

## 2021-07-29 NOTE — Progress Notes (Signed)
Dr Cathlean Sauer notified of pt's severe right side lower back pain, MD at bedside assessing pt, new orders obtained for pain meds.

## 2021-07-29 NOTE — Hospital Course (Signed)
Mrs. Denise [name] was admitted to the hospital with the working diagnosis of decompensated heart failure.   51 yo female with the past medical history of hypertension, T2DM, chronic leg edema, depression and obesity class 3 who presented with dyspnea and lower extremity edema. She was not able to provide detailed history because poor memory, but able to report worsening symptoms of dyspnea and lower extremity edema over several days. Positive pleuritic chest pain. On her initial physical examination her 02 saturation was 96% on room air, blood pressure 148/75, HR 102. RR 24. Lungs with no wheezing or rales, no rhonchi, heart with S1 and S2 present and rhythmic, abdomen not distended and positive lower extremity edema.   Na 143, K 3,5 Cl 103, bicarbonate 24, glucose 99, bun 5 cr 0,83  Wbc 11,9 hgb 13.2 plt 358   Chest radiograph with mild cardiomegaly with no infiltrates or effusions.  CT chest negative for pulmonary embolism, faint bilateral ground glass opacities, mild basal atelectasis bilaterally.   EKG 100 bpm, normal axis, normal intervals, sinus rhythm with no significant ST segment or T wave changes.

## 2021-07-29 NOTE — Assessment & Plan Note (Addendum)
Echocardiogram from 06/2021 with preserved LV systolic function EF LV 60 to 97%, RV systolic function preserved, no significant valvular disease.   Urine output 1,500 cc since admission with improvement in her edema along with dyspnea.    Systolic blood pressure is 124 to 133 mmHg.  Plan to transition to oral furosemide in am Add SGLT 2 inh.  Will give today 40 meq Kcl to prevent hypokalemia.

## 2021-07-29 NOTE — Assessment & Plan Note (Signed)
Depression.  Continue with nortriptyline and buspirone.

## 2021-07-29 NOTE — Assessment & Plan Note (Addendum)
Continue blood pressure control with amlodipine and will transition from ace inh to ARB.

## 2021-07-29 NOTE — Assessment & Plan Note (Signed)
Continue with Cpap 

## 2021-07-29 NOTE — Telephone Encounter (Signed)
Pharmacy Patient Advocate Encounter  Insurance verification completed.    The patient is insured through [facility]   The patient is currently admitted and ran test claims for the following: Farxiga 10 and Jardiance 10 mg.  Copays and coinsurance results were relayed to Inpatient clinical team.

## 2021-07-29 NOTE — Progress Notes (Signed)
  Progress Note   Patient: Denise [name] OQH:[id number] DOB: 1970/05/20 DOA: 07/28/2021     1 DOS: the patient was seen and examined on 07/29/2021   Brief hospital course: Denise [name] was admitted to the hospital with the working diagnosis of decompensated heart failure.   51 yo female with the past medical history of hypertension, T2DM, chronic leg edema, depression and obesity class 3 who presented with dyspnea and lower extremity edema. She was not able to provide detailed history because poor memory, but able to report worsening symptoms of dyspnea and lower extremity edema over several days. Positive pleuritic chest pain. On her initial physical examination her 02 saturation was 96% on room air, blood pressure 148/75, HR 102. RR 24. Lungs with no wheezing or rales, no rhonchi, heart with S1 and S2 present and rhythmic, abdomen not distended and positive lower extremity edema.   Na 143, K 3,5 Cl 103, bicarbonate 24, glucose 99, bun 5 cr 0,83  Wbc 11,9 hgb 13.2 plt 358   Chest radiograph with mild cardiomegaly with no infiltrates or effusions.  CT chest negative for pulmonary embolism, faint bilateral ground glass opacities, mild basal atelectasis bilaterally.   EKG 100 bpm, normal axis, normal intervals, sinus rhythm with no significant ST segment or T wave changes.   Assessment and Plan: * Acute on chronic diastolic CHF (congestive heart failure) (HCC) Echocardiogram from 06/2021 with preserved LV systolic function EF LV 60 to 54%, RV systolic function preserved, no significant valvular disease.   Urine output 1,500 cc since admission with improvement in her edema along with dyspnea.    Systolic blood pressure is 124 to 133 mmHg.  Plan to transition to oral furosemide in am Add SGLT 2 inh.  Will give today 40 meq Kcl to prevent hypokalemia.   Hypertension Continue blood pressure control with amlodipine and will transition from ace inh to ARB.  Type 2 diabetes mellitus  with hyperlipidemia (HCC) Continue glucose cover and monitoring with insulin sliding scale.   Continue with atorvastatin.   OSA on CPAP Continue with C pap.   Social anxiety disorder Depression.  Continue with nortriptyline and buspirone.   Back pain Lower back pain with acute exacerbation.   Plan to continue pain control with acetaminophen and meloxicam.  Continue with gabapentin and will add prn oxycodone for severe pain.   Class 3 obesity (HCC) Calculated BMI is 55.         Subjective: Patient with back pain on the right lower back, worse with movement, no chest pain and no dyspnea   Physical Exam: Vitals:   07/29/21 0651 07/29/21 0746 07/29/21 0800 07/29/21 1124  BP:  133/76  124/70  Pulse:  92  93  Resp:  '20 20 17  '$ Temp: 98.3 F (36.8 C) 97.7 F (36.5 C)  (!) 97.5 F (36.4 C)  TempSrc: Axillary Oral  Oral  SpO2:  91%  90%  Weight:      Height:       Neurology awake and alert ENT with no pallor Cardiovascular with S1 and S2 present and rhythmic Respiratory with no rales or wheezing, no rhonchi Abdomen protuberant but not distended Lower extremity edema trace.  Data Reviewed:    Family Communication: no family at the bedside   Disposition: Status is: Inpatient Remains inpatient appropriate because: heart failure   Planned Discharge Destination: Home      Author: Tawni Millers, MD 07/29/2021 1:08 PM  For on call review [email or web address].

## 2021-07-29 NOTE — Assessment & Plan Note (Signed)
Lower back pain with acute exacerbation.   Plan to continue pain control with acetaminophen and meloxicam.  Continue with gabapentin and will add prn oxycodone for severe pain.

## 2021-07-29 NOTE — TOC Benefit Eligibility Note (Signed)
Patient Teacher, English as a foreign language completed.    The patient is currently admitted and upon discharge could be taking Farxiga 10 mg.  The current 30 day co-pay is, $4.00.   The patient is currently admitted and upon discharge could be taking Jardiance 10 mg.  The current 30 day co-pay is, $4.00.   The patient is insured through [place], [place] Patient Advocate Specialist [place] Patient Advocate Team Direct Number: [phone]  Fax: [phone]

## 2021-07-29 NOTE — Progress Notes (Signed)
07/29/21 1200  Clinical Encounter Type  Visited With Patient;Health care provider Jimmy Picket, MD)  Visit Type Initial  Referral From Nurse Cicero Duck, RN; Elisabeth Most. Kathrine Haddock, RN)  Consult/Referral To Chaplain [name] Beam)  Recommendations Advance Directive Education  Spiritual Encounters  Spiritual Needs Sacred text   Chaplain responded to spiritual care consultation requesting assistance for Advance Directive preparation with [name]. Met with [name] at patient's bedside.   Denise [name] indicated that she intends to list her husband, Denise [name] [phone], as her [place].   Chaplain provided the Advance Directive packet as well as education on Advance Directives-documents an individual completes to communicate their health care directions in advance of a time when they may need them. Chaplain informed [name] the documents which may be completed here in the hospital are the Living Will and [place].   Chaplain informed [name] that the [place] is a legal document in which an individual names another person, as their [place], to communicate her health care decisions, when she is not able to communicate them for herself. The Health Care Agent's function can be temporary or permanent depending on her ability to make and communicate those decisions independently.   Chaplain informed [name] in the absence of a [place], the state of [place] directs health care providers to look to the following individuals in the order listed: legal guardian; an attorney-in-fact under a general power of attorney (POA) if that POA includes the right to make health care decisions; person's spouse; a 31 of her adult children; a 37 of adult brothers and sisters; or an individual who has an  established relationship with you, who is acting in good faith and who can convey your wishes.  If none of these persons are available or willing to make medical decisions on a patient's behalf, the law allows the patient's doctor to make decisions for them as long as another doctor agrees with those decisions.  Chaplain also informed the patient that the Health Care agent has no decision-making authority over any affairs other than those related to her medical care.   The chaplain further educated [name] that a Living Will is a legal document that allows her desires not to receive life-prolonging measures in the event that she has a condition that is incurable and will result in her death in a short period of time; they are unconscious, and doctors are confident that she will not regain consciousness; and/or she has advanced dementia or other substantial and irreversible loss of mental function.   The chaplain informed [name] that life-prolonging measures are medical treatments that would only serve to postpone death, including breathing machines, kidney dialysis, antibiotics, artificial nutrition and hydration (tube feeding), and similar forms of treatment and that if an individual is able to express their wishes, they may also make them known without the use of a Living Will, but in the event that she is not able to express her wishes, a Living Will allows medical providers and the her family and friends to ensure that they are not making decisions on her behalf, but rather serving as the her voice to convey decisions that she has already made.   Denise [name] is aware that the decision to create an advance directive is hers alone and she may choose not to complete the documents or may choose to complete  one portion or both.  Denise [name] was informed that she can revoke the documents at any time by striking through them and writing void or by completing new documents,  but that it is also advisable that the individual verbally notify interested parties that her wishes have changed.   Denise [name] is also aware that the document must be signed in the presence of a notary public and two witnesses and that this may be done while the patient is still admitted to the hospital or after discharge in the community. If they decide to complete Advance Directives after being discharged from the hospital, they have been advised to notify all interested parties and to provide those documents to their physicians and loved ones in addition to bringing them to the hospital in the event of another hospitalization.   The chaplain informed the [name] that if she desires to proceed with completing the Advance Directive Documentation while she is still admitted, notary services are typically available at [facility] between the hours of 1:00 and 3:30 Monday-Thursday.   When the patient is ready to have these documents completed, the patient should request that their nurse place a spiritual care consult and indicate that the patient is ready to have their advance directives notarized so that arrangements for witnesses and notary public can be made.  If there is an immediate need to have notarized please page the Chaplain.   Please page spiritual care if the patient desires further education or has questions.   [address] [place], M. Min., [phone].

## 2021-07-30 ENCOUNTER — Telehealth (HOSPITAL_COMMUNITY): Payer: Medicaid Other | Admitting: Psychiatry

## 2021-07-30 ENCOUNTER — Telehealth (HOSPITAL_COMMUNITY): Payer: Self-pay | Admitting: Psychiatry

## 2021-07-30 DIAGNOSIS — I5033 Acute on chronic diastolic (congestive) heart failure: Secondary | ICD-10-CM | POA: Diagnosis not present

## 2021-07-30 LAB — BASIC METABOLIC PANEL
Anion gap: 9 (ref 5–15)
BUN: 9 mg/dL (ref 6–20)
CO2: 28 mmol/L (ref 22–32)
Calcium: 9.3 mg/dL (ref 8.9–10.3)
Chloride: 104 mmol/L (ref 98–111)
Creatinine, Ser: 0.94 mg/dL (ref 0.44–1.00)
GFR, Estimated: >60 mL/min (ref >60)
Glucose, Bld: 134 mg/dL — ABNORMAL HIGH (ref 70–99)
Potassium: 3.8 mmol/L (ref 3.5–5.1)
Sodium: 141 mmol/L (ref 135–145)

## 2021-07-30 LAB — GLUCOSE, CAPILLARY
Glucose-Capillary: 143 mg/dL — ABNORMAL HIGH (ref 70–99)
Glucose-Capillary: 142 mg/dL — ABNORMAL HIGH (ref 70–99)
Glucose-Capillary: 169 mg/dL — ABNORMAL HIGH (ref 70–99)
Glucose-Capillary: 141 mg/dL — ABNORMAL HIGH (ref 70–99)

## 2021-07-30 MED ORDER — FUROSEMIDE 10 MG/ML IJ SOLN: 40.0000 mg | Freq: Two times a day (BID) | INTRAMUSCULAR | Status: DC

## 2021-07-30 MED ORDER — IPRATROPIUM-ALBUTEROL 0.5-2.5 (3) MG/3ML IN SOLN
3.0000 mL | Freq: Four times a day (QID) | RESPIRATORY_TRACT | Status: DC
  Administered 2021-07-30 – 2021-07-31 (×5): 3 mL via RESPIRATORY_TRACT
  Filled 2021-07-30 (×6): qty 3

## 2021-07-30 MED ORDER — FUROSEMIDE 20 MG PO TABS: 20.0000 mg | ORAL_TABLET | Freq: Once | ORAL | Status: DC

## 2021-07-30 MED ORDER — FUROSEMIDE 40 MG PO TABS: 40.0000 mg | ORAL_TABLET | Freq: Every day | ORAL | Status: DC

## 2021-07-30 MED ORDER — FUROSEMIDE 40 MG PO TABS: 80.0000 mg | ORAL_TABLET | Freq: Every day | ORAL | Status: DC

## 2021-07-30 MED ORDER — FUROSEMIDE 40 MG PO TABS
40.0000 mg | ORAL_TABLET | Freq: Once | ORAL | Status: DC
Start: 2021-07-30 — End: 2021-07-30

## 2021-07-30 MED ORDER — FUROSEMIDE 10 MG/ML IJ SOLN
60.0000 mg | Freq: Two times a day (BID) | INTRAMUSCULAR | Status: AC
  Administered 2021-07-30 – 2021-07-31 (×2): 60 mg via INTRAVENOUS
  Filled 2021-07-30 (×2): qty 6

## 2021-07-30 MED ORDER — PREDNISONE 20 MG PO TABS
40.0000 mg | ORAL_TABLET | Freq: Every day | ORAL | Status: DC
  Administered 2021-07-31 – 2021-08-01 (×2): 40 mg via ORAL
  Filled 2021-07-30 (×2): qty 2

## 2021-07-30 MED ORDER — FUROSEMIDE 10 MG/ML IJ SOLN: 60.0000 mg | Freq: Two times a day (BID) | INTRAMUSCULAR | Status: DC

## 2021-07-30 NOTE — TOC Progression Note (Signed)
Transition of Care [facility]) - Progression Note    Patient Details  Name: Jaylin Roundy MRN: [id number] Date of Birth: [birth year]  Transition of [facility] CM/SW Contact  Loletha Grayer Beverely Pace, RN Phone Number: 07/30/2021, 12:10 PM  Clinical Narrative:   Case manager spoke with patient concerning need for [place] therapy at discharge. Patient is agreeable. CM contacted [facility] to see if they can accept a Medicaid patient, they have accepted.     Expected Discharge Plan: [place] Barriers to Discharge: No Barriers Identified  Expected Discharge Plan and Services Expected Discharge Plan: [place]   Discharge Planning Services: CM Consult   Living arrangements for the past 2 months: Apartment                 DME Arranged: Oxygen DME Agency: [employer] Date DME Agency Contacted: 07/30/21 Time DME Agency Contacted: [phone] Representative spoke with at DME Agency: [place]: PT [place]: [place] Date [place]: 07/30/21 Time [place]: [phone] Representative spoke with at [place]: [place] Determinants of Health ([place]) Interventions    Readmission Risk Interventions     No data to display

## 2021-07-30 NOTE — Telephone Encounter (Signed)
I called Denise [name] during out scheduled appointment time. She informed me she is currently in the hospital due to fluid buildup. She has been in the hospital since 07/28/21. We rescheduled our visit to 08/13/21.

## 2021-07-30 NOTE — Progress Notes (Signed)
Mobility Specialist Progress Note    07/30/21 1719  Mobility  Activity Ambulated with assistance in room  Level of Assistance Standby assist, set-up cues, supervision of patient - no hands on  Assistive Device Four wheel walker  Distance Ambulated (ft) 24 ft (12+12)  Activity Response Tolerated well  $Mobility charge 1 Mobility   Pt received in bed and agreeable. Completed x2 bouts from EOB to wall then used backwards steps to bed. Left in bed with call bell in reach.   Hildred Alamin Mobility Specialist

## 2021-07-30 NOTE — Progress Notes (Signed)
PROGRESS NOTE  Denise [name]  HCW:[id number] DOB: 1970/07/12 DOA: 07/28/2021 PCP: [facility]   Brief Narrative:   Patient is a 51 year old female with history of hypertension, type 2 diabetes, chronic bilateral lower extremity edema who presented with dyspnea, bilateral lower EXTR edema.  Chest x-ray) showed mild cardiomegaly, no infiltrates or effusions.  Chest CT negative for PE but showed faint bilateral groundglass opacity.  Patient was admitted for the management of fluid buildup likely from diastolic congestive heart failure.  Currently on IV Lasix.  Assessment & Plan:  Principal Problem:   Acute on chronic diastolic CHF (congestive heart failure) (HCC) Active Problems:   Hypertension   Type 2 diabetes mellitus with hyperlipidemia (HCC)   OSA on CPAP   Social anxiety disorder   Class 3 obesity (HCC)   Back pain  Acute on chronic diastolic congestive heart failure: Presented with bilateral lower extremity edema, dyspnea.  Takes Lasix 80 mg daily at home.  Found to be volume overloaded on presentation.  BNP normal.  Echocardiogram showed EF of 60 to 17%, normal diastolic parameters. Clinically she looks volume overloaded, started on Lasix 60 mg IV, will give 2 doses.  Assess volume status tomorrow.  Monitor input/output, daily weight. Added SGLT2 inhibitor. Check BMP tomorrow  Acute on chronic hypoxic respiratory failure: Patient on 2 L of oxygen at home.  Needed 3 L here.  Respiratory failure secondary to COPD and CHF.  Continue to wean the oxygen.  COPD: Patient is not aware about history of COPD.  She smokes.  She has audible wheezing today.  She takes inhalers at home.  Started on 40 mg of prednisone.  Continue DuoNeb   Hypertension: Continue amlodipine, losartan.  Monitor blood pressure  Type 2 diabetes: currently on sliding scale insulin.  Started on his SGLT 2 inhibitor here.A1c of 6.5  Hyperlipidemia: Continue Lipitor  OSA: On CPAP  at home  Depression: On Adderall, buspirone.  Follows with psychiatry as an outpatient  Back pain: Chronic.  Currently on Tylenol, meloxicam, oxycodone, gabapentin  Moderate obesity: BMI 55.1  Deconditioning/debility: Patient seen by PT and recommended home health on discharge.    DVT prophylaxis:  Lovenox     Code Status: Full Code  Family Communication: None at bedside  Patient status: Inpatient  Patient is from : Home  Anticipated discharge to: Home  Estimated DC date: In 1 to 2 days   Consultants: None  Procedures:None  Antimicrobials:  Anti-infectives (From admission, onward)    None       Subjective:  Patient seen and examined at the bedside this morning.  Hemodynamically stable.  Lying in bed.  Complains of weakness.  On 2 L of oxygen per minute.  Wheezing today, has  bilateral lower extremity edema.  Does not feel ready to go home yet   Objective: Vitals:   07/29/21 2059 07/30/21 0617 07/30/21 0834 07/30/21 1217  BP: (!) 102/55 136/84  136/71  Pulse: 82 86  82  Resp: '16 15  13  '$ Temp: 98.3 F (36.8 C) 98 F (36.7 C)  (!) 97.1 F (36.2 C)  TempSrc: Oral Axillary  Oral  SpO2: 92% 95% 97% 91%  Weight:      Height:        Intake/Output Summary (Last 24 hours) at 07/30/2021 1259 Last data filed at 07/30/2021 1233 Gross per 24 hour  Intake 840 ml  Output 750 ml  Net 90 ml   Filed Weights   07/28/21 1518 07/28/21  1648 07/29/21 0500  Weight: (!) 148.7 kg (!) 151.5 kg (!) 150.2 kg    Examination:  General exam: Overall comfortable, not in distress, morbidly obese HEENT: PERRL Respiratory system: Bilateral wheezing Cardiovascular system: S1 & S2 heard, RRR.  Gastrointestinal system: Abdomen is nondistended, soft and nontender. Central nervous system: Alert and oriented Extremities: Trace bilateral lower extremity edema, no clubbing ,no cyanosis Skin: No rashes, no ulcers,no icterus     Data Reviewed: I have personally reviewed following  labs and imaging studies  CBC: Recent Labs  Lab 07/28/21 1535 07/29/21 0449  WBC 11.9* 9.2  HGB 13.2 13.2  HCT 41.3 41.8  MCV 90.2 90.1  PLT 358 244   Basic Metabolic Panel: Recent Labs  Lab 07/28/21 1535 07/29/21 0449 07/30/21 0258  NA 143 143 141  K 3.5 3.8 3.8  CL 103 104 104  CO2 '24 28 28  '$ GLUCOSE 99 115* 134*  BUN 5* 8 9  CREATININE 0.83 0.78 0.94  CALCIUM 9.3 8.9 9.3  MG  --  2.1  --      No results found for this or any previous visit (from the past 240 hour(s)).   Radiology Studies: CT Angio Chest Pulmonary Embolism (PE) W or WO Contrast  Result Date: 07/29/2021 CLINICAL DATA:  Shortness of breath and known leg swelling, initial encounter EXAM: CT ANGIOGRAPHY CHEST WITH CONTRAST TECHNIQUE: Multidetector CT imaging of the chest was performed using the standard protocol during bolus administration of intravenous contrast. Multiplanar CT image reconstructions and MIPs were obtained to evaluate the vascular anatomy. RADIATION DOSE REDUCTION: This exam was performed according to the departmental dose-optimization program which includes automated exposure control, adjustment of the mA and/or kV according to patient size and/or use of iterative reconstruction technique. CONTRAST:  17m OMNIPAQUE IOHEXOL 350 MG/ML SOLN COMPARISON:  Chest x-ray from earlier in the same day. FINDINGS: Cardiovascular: Thoracic aorta demonstrates atherosclerotic calcifications without aneurysmal dilatation. Heart is at the upper limits of normal in size. Pulmonary artery shows a normal branching pattern bilaterally. The degree of pulmonary arterial opacification is suboptimal although no pulmonary emboli are noted. No coronary calcifications are seen. Mediastinum/Nodes: Thoracic inlet is within normal limits. No hilar or mediastinal adenopathy is noted. The esophagus is within limits. Lungs/Pleura: Lungs are well aerated bilaterally. Minimal atelectatic changes are seen. No focal confluent infiltrate  or effusion is noted. No parenchymal nodules are seen. Upper Abdomen: Visualized upper abdomen shows no acute abnormality. Musculoskeletal: Degenerative changes of the thoracic spine are noted. No acute rib abnormality is noted. Review of the MIP images confirms the above findings. IMPRESSION: No evidence of pulmonary emboli. Mild atelectatic changes.  No other focal abnormality is noted. Electronically Signed   By: MInez CatalinaM.D.   On: 07/29/2021 01:07   DG Chest 2 View  Result Date: 07/28/2021 CLINICAL DATA:  shortness of breath, bilateral leg and hand swelling x 2 days. EXAM: CHEST - 2 VIEW COMPARISON:  Cxr 02/24/21 FINDINGS: The heart and mediastinal contours are unchanged. Aortic calcification. No focal consolidation. No pulmonary edema. No pleural effusion. No pneumothorax. No acute osseous abnormality. IMPRESSION: 1. No active cardiopulmonary disease. 2.  Aortic Atherosclerosis (ICD10-I70.0). Electronically Signed   By: MIven FinnM.D.   On: 07/28/2021 16:01    Scheduled Meds:  amLODipine  10 mg Oral Daily   atorvastatin  20 mg Oral Daily   busPIRone  15 mg Oral BID   empagliflozin  10 mg Oral Daily   enoxaparin (LOVENOX) injection  75  mg Subcutaneous Q24H   furosemide  60 mg Intravenous Q12H   gabapentin  900 mg Oral TID   insulin aspart  0-5 Units Subcutaneous QHS   insulin aspart  0-6 Units Subcutaneous TID WC   losartan  25 mg Oral Daily   meloxicam  15 mg Oral Daily   mometasone-formoterol  2 puff Inhalation BID   nicotine  14 mg Transdermal Daily   nortriptyline  150 mg Oral QHS   sodium chloride flush  3 mL Intravenous Q12H   Continuous Infusions:   LOS: 2 days   Shelly Coss, MD [facility] P7/06/2021, 12:59 PM

## 2021-07-30 NOTE — Progress Notes (Signed)
SATURATION QUALIFICATIONS: (This note is used to comply with regulatory documentation for home oxygen)  Patient Saturations on Room Air at Rest = 86%  Patient Saturations on Room Air while Ambulating = NT  Patient Saturations on 3 Liters of oxygen while Ambulating = 93%  Please briefly explain why patient needs home oxygen: Unable to maintain adequate level oxygenation with activity or at rest without supplemental [place] Office [phone]

## 2021-07-30 NOTE — Progress Notes (Signed)
Heart Failure Navigator Progress Note  Assessed for Heart & Vascular TOC clinic readiness.  Patient does not meet criteria due to memory issues. EF 60-65%.    Denise [name], BSN, Clinical cytogeneticist Only

## 2021-07-30 NOTE — TOC Progression Note (Signed)
Transition of Care [facility] - Progression Note    Patient Details  Name: Denise [name] MRN: [id number] Date of Birth: [birth year]  [facility] of [facility]) CM/SW Contact  Loletha Grayer Beverely Pace, RN Phone Number: 07/30/2021, 11:47 AM  Clinical Narrative:   Case manager spoke with patient concerning oxygen needs for discharge. She states she has tank at home, she has been oxygen dependent. Case manager has requested transport tank from [employer] for patient. No further needs identified.     Expected Discharge Plan: Home/Self Care Barriers to Discharge: No Barriers Identified  Expected Discharge Plan and Services Expected Discharge Plan: Home/Self Care   Discharge Planning Services: CM Consult   Living arrangements for the past 2 months: Apartment                 DME Arranged: Oxygen DME Agency: [employer] Date DME Agency Contacted: 07/30/21 Time DME Agency Contacted: [phone] Representative spoke with at DME Agency: Myrtha Mantis HH Arranged: NA           Social Determinants of Health ([place]) Interventions    Readmission Risk Interventions     No data to display

## 2021-07-30 NOTE — Evaluation (Addendum)
Physical Therapy Evaluation Patient Details Name: Denise [name] MRN: [id number] DOB: 05-09-1970 Today's Date: 07/30/2021  History of Present Illness  Pt adm 7/4 with acute on chronic diastolic CHF. PMH - memory changes, anxiety, obesity, hypertension, OSA, history of TIA  Clinical Impression  Pt presents to PT with very poor functional activity tolerance and only able to amb 10' max before needing to sit and rest. At baseline pt has been very sedentary. Expect that being up in chair today and mobilizing will help pt progress to a more independent level tomorrow. Recommend HHPT at DC.        Recommendations for follow up therapy are one component of a multi-disciplinary discharge planning process, led by the attending physician.  Recommendations may be updated based on patient status, additional functional criteria and insurance authorization.  Follow Up Recommendations Home health PT      Assistance Recommended at Discharge Intermittent Supervision/Assistance  Patient can return home with the following  Help with stairs or ramp for entrance;A little help with walking and/or transfers    Equipment Recommendations None recommended by PT  Recommendations for Other Services       Functional Status Assessment Patient has had a recent decline in their functional status and demonstrates the ability to make significant improvements in function in a reasonable and predictable amount of time.     Precautions / Restrictions Precautions Precautions: Fall      Mobility  Bed Mobility Overal bed mobility: Modified Independent             General bed mobility comments: HOB elevated, use of rails and incr time and effort to perform    Transfers Overall transfer level: Needs assistance Equipment used: Rollator (4 wheels) Transfers: Sit to/from Stand Sit to Stand: Supervision           General transfer comment: Assist for safety    Ambulation/Gait Ambulation/Gait  assistance: Min guard Gait Distance (Feet): 10 Feet (3' x 2, 10' x 1) Assistive device: Rollator (4 wheels) Gait Pattern/deviations: Step-to pattern, Decreased step length - right, Decreased step length - left, Decreased stride length, Shuffle, Trunk flexed, Wide base of support Gait velocity: decr Gait velocity interpretation: <1.31 ft/sec, indicative of household ambulator   General Gait Details: Assist for safety. Pt flexed forward with forearms propped on rollator. Pt fatigues quickly and had to sit and rest initially after only a few feet.  Stairs            Wheelchair Mobility    Modified Rankin (Stroke Patients Only)       Balance Overall balance assessment: Needs assistance Sitting-balance support: No upper extremity supported, Feet supported Sitting balance-Leahy Scale: Good     Standing balance support: No upper extremity supported, During functional activity Standing balance-Leahy Scale: Fair                               Pertinent Vitals/Pain Pain Assessment Pain Assessment: No/denies pain    Home Living Family/patient expects to be discharged to:: Private residence Living Arrangements: Spouse/significant other Available Help at Discharge: Family;Available 24 hours/day Type of Home: Other(Comment) (extended stay hotel with kitchenette - Has been there for many years) Home Access: Level entry       Home Layout: One level Home Equipment: Rollator (4 wheels);Other (comment) Additional Comments: Home O2 at night and PRN during the day    Prior Function Prior Level of Function : Needs assist  Physical Assist : Mobility (physical) Mobility (physical): Stairs (to step up onto sidewalk)   Mobility Comments: Pt uses rollator for short household distance. Very sedentary       Hand Dominance        Extremity/Trunk Assessment   Upper Extremity Assessment Upper Extremity Assessment: Defer to OT evaluation    Lower Extremity  Assessment Lower Extremity Assessment: Generalized weakness       Communication   Communication: No difficulties  Cognition Arousal/Alertness: Awake/alert Behavior During Therapy: WFL for tasks assessed/performed Overall Cognitive Status: History of cognitive impairments - at baseline                                 General Comments: Pt with history of memory deficits        General Comments      Exercises     Assessment/Plan    PT Assessment Patient needs continued PT services  PT Problem List Decreased strength;Decreased activity tolerance;Decreased balance;Decreased mobility;Obesity       PT Treatment Interventions DME instruction;Gait training;Functional mobility training;Therapeutic activities;Therapeutic exercise;Balance training;Patient/family education    PT Goals (Current goals can be found in the Care Plan section)  Acute Rehab PT Goals Patient Stated Goal: get stronger PT Goal Formulation: With patient Time For Goal Achievement: 08/06/21 Potential to Achieve Goals: Good    Frequency Min 3X/week     Co-evaluation               AM-PAC PT "6 Clicks" Mobility  Outcome Measure Help needed turning from your back to your side while in a flat bed without using bedrails?: None Help needed moving from lying on your back to sitting on the side of a flat bed without using bedrails?: A Little Help needed moving to and from a bed to a chair (including a wheelchair)?: A Little Help needed standing up from a chair using your arms (e.g., wheelchair or bedside chair)?: A Little Help needed to walk in hospital room?: Total Help needed climbing 3-5 steps with a railing? : Total 6 Click Score: 15    End of Session Equipment Utilized During Treatment: Oxygen Activity Tolerance: Patient limited by fatigue Patient left: in chair;with call bell/phone within reach;with chair alarm set Nurse Communication: Mobility status PT Visit Diagnosis: Other  abnormalities of gait and mobility (R26.89);Muscle weakness (generalized) (M62.81)    Time: 7371-0626 PT Time Calculation (min) (ACUTE ONLY): 34 min   Charges:   PT Evaluation $PT Eval Moderate Complexity: [place] Office [place] 07/30/2021, 10:38 AM

## 2021-07-31 ENCOUNTER — Other Ambulatory Visit (HOSPITAL_COMMUNITY): Payer: Self-pay

## 2021-07-31 DIAGNOSIS — I5033 Acute on chronic diastolic (congestive) heart failure: Secondary | ICD-10-CM | POA: Diagnosis not present

## 2021-07-31 LAB — BASIC METABOLIC PANEL
Anion gap: 8 (ref 5–15)
BUN: 7 mg/dL (ref 6–20)
CO2: 30 mmol/L (ref 22–32)
Calcium: 9.4 mg/dL (ref 8.9–10.3)
Chloride: 103 mmol/L (ref 98–111)
Creatinine, Ser: 0.9 mg/dL (ref 0.44–1.00)
GFR, Estimated: >60 mL/min (ref >60)
Glucose, Bld: 125 mg/dL — ABNORMAL HIGH (ref 70–99)
Potassium: 4.1 mmol/L (ref 3.5–5.1)
Sodium: 141 mmol/L (ref 135–145)

## 2021-07-31 LAB — GLUCOSE, CAPILLARY
Glucose-Capillary: 141 mg/dL — ABNORMAL HIGH (ref 70–99)
Glucose-Capillary: 138 mg/dL — ABNORMAL HIGH (ref 70–99)
Glucose-Capillary: 201 mg/dL — ABNORMAL HIGH (ref 70–99)
Glucose-Capillary: 175 mg/dL — ABNORMAL HIGH (ref 70–99)

## 2021-07-31 MED ORDER — IPRATROPIUM-ALBUTEROL 0.5-2.5 (3) MG/3ML IN SOLN
3.0000 mL | Freq: Three times a day (TID) | RESPIRATORY_TRACT | Status: DC
  Administered 2021-08-01: 3 mL via RESPIRATORY_TRACT
  Filled 2021-07-31: qty 3

## 2021-07-31 MED ORDER — FUROSEMIDE 10 MG/ML IJ SOLN
60.0000 mg | Freq: Two times a day (BID) | INTRAMUSCULAR | Status: DC
  Administered 2021-07-31 – 2021-08-01 (×2): 60 mg via INTRAVENOUS
  Filled 2021-07-31 (×2): qty 6

## 2021-07-31 MED ORDER — FUROSEMIDE 10 MG/ML IJ SOLN
60.0000 mg | Freq: Two times a day (BID) | INTRAMUSCULAR | Status: DC
Start: 2021-07-31 — End: 2021-07-31

## 2021-07-31 MED ORDER — EMPAGLIFLOZIN 10 MG PO TABS
10.0000 mg | ORAL_TABLET | Freq: Every day | ORAL | 1 refills | Status: DC
  Filled 2021-07-31: qty 30, 30d supply, fill #0

## 2021-07-31 NOTE — Progress Notes (Signed)
Physical Therapy Treatment Patient Details Name: Denise [name] MRN: [id number] DOB: 08-10-1970 Today's Date: 07/31/2021   History of Present Illness Pt adm 7/4 with acute on chronic diastolic CHF. PMH - memory changes, anxiety, obesity, hypertension, OSA, history of TIA    PT Comments    Pt received supine with HOB elevated, eager and motivated for session focused on progression of functional transfers and gait. Pt able to perform all bed mobility with supervision for safety and transfers with min guard with rollator without LOB and good stability. Pt demonstrating increased ambulation endurance this session with rollator and min guard for safety requiring short x1 seated rest, pt without LOB, however noted instability with wide BOS and lateral sway throughout. Educated pt re; daily weights, nutrition, and activity recommendations with pt verbalizing understanding. Further exercise deferred as RT arriving for treatment. Pt continues to benefit from skilled PT services to progress toward functional mobility goals.    Recommendations for follow up therapy are one component of a multi-disciplinary discharge planning process, led by the attending physician.  Recommendations may be updated based on patient status, additional functional criteria and insurance authorization.  Follow Up Recommendations  Home health PT     Assistance Recommended at Discharge Intermittent Supervision/Assistance  Patient can return home with the following Help with stairs or ramp for entrance;A little help with walking and/or transfers   Equipment Recommendations  None recommended by PT    Recommendations for Other Services       Precautions / Restrictions Precautions Precautions: Fall Restrictions Weight Bearing Restrictions: No     Mobility  Bed Mobility Overal bed mobility: Modified Independent             General bed mobility comments: HOB elevated, use of rails and incr time and effort  to perform    Transfers Overall transfer level: Needs assistance Equipment used: Rollator (4 wheels) Transfers: Sit to/from Stand Sit to Stand: Supervision           General transfer comment: Assist for safety    Ambulation/Gait Ambulation/Gait assistance: Min guard Gait Distance (Feet): 190 Feet (80' + 110') Assistive device: Rollator (4 wheels) Gait Pattern/deviations: Decreased step length - right, Decreased step length - left, Decreased stride length, Shuffle, Wide base of support, Step-through pattern Gait velocity: decr     General Gait Details: slow lumbering gait, pt able to extend trunk with cues and maintain, pt with much increased endurance although continues to fatigue quickly, cues for breathing techniques, stating this was farthest she has walked in a very long time, short seated rest x1 during   Stairs             Wheelchair Mobility    Modified Rankin (Stroke Patients Only)       Balance Overall balance assessment: Needs assistance Sitting-balance support: No upper extremity supported, Feet supported Sitting balance-Leahy Scale: Good     Standing balance support: No upper extremity supported, During functional activity Standing balance-Leahy Scale: Fair                              Cognition Arousal/Alertness: Awake/alert Behavior During Therapy: WFL for tasks assessed/performed Overall Cognitive Status: History of cognitive impairments - at baseline                                 General Comments: Pt with history of memory deficits  Exercises      General Comments        Pertinent Vitals/Pain      Home Living                          Prior Function            PT Goals (current goals can now be found in the care plan section) Acute Rehab PT Goals Patient Stated Goal: walk more PT Goal Formulation: With patient Time For Goal Achievement: 08/06/21    Frequency    Min  3X/week      PT Plan      Co-evaluation              AM-PAC PT "6 Clicks" Mobility   Outcome Measure  Help needed turning from your back to your side while in a flat bed without using bedrails?: None Help needed moving from lying on your back to sitting on the side of a flat bed without using bedrails?: A Little Help needed moving to and from a bed to a chair (including a wheelchair)?: A Little Help needed standing up from a chair using your arms (e.g., wheelchair or bedside chair)?: A Little Help needed to walk in hospital room?: A Lot Help needed climbing 3-5 steps with a railing? : Total 6 Click Score: 16    End of Session Equipment Utilized During Treatment: Oxygen Activity Tolerance: Patient tolerated treatment well Patient left: with call bell/phone within reach;in bed;with nursing/sitter in room (with RT present) Nurse Communication: Mobility status PT Visit Diagnosis: Other abnormalities of gait and mobility (R26.89);Muscle weakness (generalized) (M62.81)     Time: 6578-4696 PT Time Calculation (min) (ACUTE ONLY): 26 min  Charges:  $Gait Training: 8-22 mins $Therapeutic Activity: 8-22 mins                     Callan Norden R. PTA Acute Rehabilitation Services Office: [place] 07/31/2021, 3:50 PM

## 2021-07-31 NOTE — Progress Notes (Addendum)
PROGRESS NOTE  Denise [name]  EPP:[id number] DOB: [birth year] DOA: 07/28/2021 PCP: [facility]   Brief Narrative:   Patient is a 51 year old female with history of hypertension, type 2 diabetes, chronic bilateral lower extremity edema who presented with dyspnea, bilateral lower EXTR edema.  Chest x-ray showed mild cardiomegaly, no infiltrates or effusions.  Chest CT negative for PE but showed faint bilateral groundglass opacity.  Patient was admitted for the management of fluid buildup likely from diastolic congestive heart failure.  Currently on IV Lasix.Possible dc tomorrow  Assessment & Plan:  Principal Problem:   Acute on chronic diastolic CHF (congestive heart failure) (HCC) Active Problems:   Hypertension   Type 2 diabetes mellitus with hyperlipidemia (HCC)   OSA on CPAP   Social anxiety disorder   Class 3 obesity (HCC)   Back pain  Acute on chronic diastolic congestive heart failure: Presented with bilateral lower extremity edema, dyspnea.  Takes Lasix 80 mg daily at home.  Found to be volume overloaded on presentation.  BNP normal.  Echocardiogram showed EF of 60 to 60%, normal diastolic parameters. Clinically she looks volume overloaded, started on Lasix 60 mg IV,.Assess volume status tomorrow.  Monitor input/output, daily weight. Added SGLT2 inhibitor. Check BMP tomorrow  Acute on chronic hypoxic respiratory failure: Patient on 2 L of oxygen at home.  Needed 3 L here.  Respiratory failure secondary to COPD and CHF.  Continue to wean the oxygen.  COPD: Patient is not aware about history of COPD.  She smokes.  She had audible wheezing ,now free of wheezing.  She takes inhalers at home.  Started on 40 mg of prednisone.  Continue DuoNeb   Hypertension: Continue amlodipine, losartan.  Monitor blood pressure  Type 2 diabetes: currently on sliding scale insulin.  Started on his SGLT 2 inhibitor here.A1c of 6.5  Hyperlipidemia: Continue  Lipitor  OSA: On CPAP at home  Depression: On nontryptyline, buspirone.  Follows with psychiatry as an outpatient  Back pain: Chronic.  Currently on Tylenol, meloxicam, oxycodone, gabapentin  Moderate obesity: BMI 55.1  Deconditioning/debility: Patient seen by PT and recommended home health on discharge.    DVT prophylaxis:  Lovenox     Code Status: Full Code  Family Communication: None at bedside  Patient status: Inpatient  Patient is from : Home  Anticipated discharge to: Home  Estimated DC date: In 1 to 2 days   Consultants: None  Procedures:None  Antimicrobials:  Anti-infectives (From admission, onward)    None       Subjective:  Patient seen and examined at the bedside this morning.  Hemodynamically stable.  Overall comfortable, but very weak, hardly moving from the bed.  Still appears volume overloaded.  Clear of wheezing today.   Objective: Vitals:   07/30/21 2210 07/31/21 0348 07/31/21 0816 07/31/21 0825  BP:  128/86    Pulse: 86 92    Resp: 16 16    Temp:  97.7 F (36.5 C)    TempSrc:  Oral    SpO2: 100% 92% (!) 88% 100%  Weight:  (!) 149.6 kg    Height:        Intake/Output Summary (Last 24 hours) at 07/31/2021 1314 Last data filed at 07/31/2021 0900 Gross per 24 hour  Intake 320 ml  Output 2100 ml  Net -1780 ml   Filed Weights   07/28/21 1648 07/29/21 0500 07/31/21 0348  Weight: (!) 151.5 kg (!) 150.2 kg (!) 149.6 kg    Examination:  General exam: Overall comfortable, not in distress, morbidly obese HEENT: PERRL Respiratory system: Decreased air sounds bilaterally, no wheezes or crackles  Cardiovascular system: S1 & S2 heard, RRR.  Gastrointestinal system: Abdomen is nondistended, soft and nontender. Central nervous system: Alert and oriented Extremities: Trace bilateral lower extremity edema, no clubbing ,no cyanosis Skin: No rashes, no ulcers,no icterus     Data Reviewed: I have personally reviewed following labs and  imaging studies  CBC: Recent Labs  Lab 07/28/21 1535 07/29/21 0449  WBC 11.9* 9.2  HGB 13.2 13.2  HCT 41.3 41.8  MCV 90.2 90.1  PLT 358 026   Basic Metabolic Panel: Recent Labs  Lab 07/28/21 1535 07/29/21 0449 07/30/21 0258 07/31/21 0323  NA 143 143 141 141  K 3.5 3.8 3.8 4.1  CL 103 104 104 103  CO2 '24 28 28 30  '$ GLUCOSE 99 115* 134* 125*  BUN 5* '8 9 7  '$ CREATININE 0.83 0.78 0.94 0.90  CALCIUM 9.3 8.9 9.3 9.4  MG  --  2.1  --   --      No results found for this or any previous visit (from the past 240 hour(s)).   Radiology Studies: No results found.  Scheduled Meds:  amLODipine  10 mg Oral Daily   atorvastatin  20 mg Oral Daily   busPIRone  15 mg Oral BID   empagliflozin  10 mg Oral Daily   enoxaparin (LOVENOX) injection  75 mg Subcutaneous Q24H   furosemide  60 mg Intravenous Q12H   gabapentin  900 mg Oral TID   insulin aspart  0-5 Units Subcutaneous QHS   insulin aspart  0-6 Units Subcutaneous TID WC   ipratropium-albuterol  3 mL Nebulization Q6H   losartan  25 mg Oral Daily   meloxicam  15 mg Oral Daily   mometasone-formoterol  2 puff Inhalation BID   nicotine  14 mg Transdermal Daily   nortriptyline  150 mg Oral QHS   predniSONE  40 mg Oral Q breakfast   sodium chloride flush  3 mL Intravenous Q12H   Continuous Infusions:   LOS: 3 days   Shelly Coss, MD [facility] P7/07/2021, 1:14 PM

## 2021-07-31 NOTE — Plan of Care (Signed)
  Problem: Education: Goal: Knowledge of General Education information will improve Description: Including pain rating scale, medication(s)/side effects and non-pharmacologic comfort measures Outcome: Progressing   Problem: Metabolic: Goal: Ability to maintain appropriate glucose levels will improve Outcome: Progressing   

## 2021-08-01 DIAGNOSIS — I5033 Acute on chronic diastolic (congestive) heart failure: Secondary | ICD-10-CM | POA: Diagnosis not present

## 2021-08-01 LAB — BASIC METABOLIC PANEL
Anion gap: 8 (ref 5–15)
BUN: 11 mg/dL (ref 6–20)
CO2: 30 mmol/L (ref 22–32)
Calcium: 9.9 mg/dL (ref 8.9–10.3)
Chloride: 100 mmol/L (ref 98–111)
Creatinine, Ser: 0.83 mg/dL (ref 0.44–1.00)
GFR, Estimated: >60 mL/min (ref >60)
Glucose, Bld: 126 mg/dL — ABNORMAL HIGH (ref 70–99)
Potassium: 4.6 mmol/L (ref 3.5–5.1)
Sodium: 138 mmol/L (ref 135–145)

## 2021-08-01 LAB — GLUCOSE, CAPILLARY
Glucose-Capillary: 130 mg/dL — ABNORMAL HIGH (ref 70–99)
Glucose-Capillary: 154 mg/dL — ABNORMAL HIGH (ref 70–99)

## 2021-08-01 LAB — MAGNESIUM: Magnesium: 2.5 mg/dL — ABNORMAL HIGH (ref 1.7–2.4)

## 2021-08-01 MED ORDER — LOSARTAN POTASSIUM 25 MG PO TABS: 25.0000 mg | ORAL_TABLET | Freq: Every day | ORAL | 1 refills | Status: DC

## 2021-08-01 MED ORDER — FUROSEMIDE 80 MG PO TABS: 80.0000 mg | ORAL_TABLET | Freq: Every day | ORAL | 1 refills | Status: DC

## 2021-08-01 MED ORDER — NICOTINE 14 MG/24HR TD PT24: 14.0000 mg | MEDICATED_PATCH | Freq: Every day | TRANSDERMAL | 0 refills | Status: DC

## 2021-08-01 MED ORDER — POTASSIUM CHLORIDE CRYS ER 20 MEQ PO TBCR: 20.0000 meq | EXTENDED_RELEASE_TABLET | Freq: Every day | ORAL | 1 refills | Status: AC

## 2021-08-01 MED ORDER — PREDNISONE 20 MG PO TABS: 40.0000 mg | ORAL_TABLET | Freq: Every day | ORAL | 0 refills | Status: AC

## 2021-08-01 NOTE — Discharge Summary (Signed)
Physician Discharge Summary  Denise [name] PJK:[id number] DOB: Aug 28, 1970 DOA: 07/28/2021  PCP: [facility]  Admit date: 07/28/2021 Discharge date: 08/01/2021  Admitted From: Home Disposition:  Home  Discharge Condition:Stable CODE STATUS:FULL Diet recommendation: Heart Healthy  Brief/Interim Summary:  Patient is a 50 year old female with history of hypertension, type 2 diabetes, chronic bilateral lower extremity edema who presented with dyspnea, bilateral lower EXTR edema.  Chest x-ray showed mild cardiomegaly, no infiltrates or effusions.  Chest CT negative for PE but showed faint bilateral groundglass opacity.  Patient was admitted for the management of fluid buildup likely from diastolic congestive heart failure.  Started on IV Lasix.  Currently she appears near euvolemic status.  Currently on baseline oxygen requirement.  Medically stable for discharge to home today.  She will continue oral Lasix at home.  Following problems were addressed during her hospitalization:  Acute on chronic diastolic congestive heart failure: Presented with bilateral lower extremity edema, dyspnea.  Takes Lasix 80 mg daily at home.  Found to be volume overloaded on presentation.  BNP normal.  Echocardiogram showed EF of 60 to 80%, normal diastolic parameters. Clinically she looked volume overloaded, started on Lasix 60 mg IV.Added SGLT2 inhibitor.  Continue oral Lasix at home.  Follow-up with cardiology as an outpatient.  She has been instructed to monitor her fluid intake ,salt intake at home and monitor weight at home   Acute on chronic hypoxic respiratory failure: Patient on 2 L of oxygen at home.   Respiratory failure secondary to COPD and CHF.  Currently on baseline oxygen requirement   COPD: Patient is not aware about history of COPD.  She smokes.  She had audible wheezing ,now free of wheezing.  She takes inhalers at home.  Started on 40 mg of prednisone.      Hypertension: Continue amlodipine, losartan.   Type 2 diabetes:Started on his SGLT 2 inhibitor here.A1c of 6.5   Hyperlipidemia: Continue Lipitor   OSA: On CPAP at home   Depression: On nontryptyline, buspirone.  Follows with psychiatry as an outpatient   Back pain: Chronic.  Continue supportive care and continue home medications  Moderate obesity: BMI 55.1   Deconditioning/debility: Patient seen by PT and recommended home health on discharge.     Discharge Diagnoses:  Principal Problem:   Acute on chronic diastolic CHF (congestive heart failure) (HCC) Active Problems:   Hypertension   Type 2 diabetes mellitus with hyperlipidemia (HCC)   OSA on CPAP   Social anxiety disorder   Class 3 obesity (HCC)   Back pain    Discharge Instructions  Discharge Instructions     Diet - low sodium heart healthy   Complete by: As directed    Discharge instructions   Complete by: As directed    1)Please take prescribed medications as instructed 2)Monitor your salt and water intake at home.  Restrict fluid to less than 1.2 L a day, salt less than 2 g a day.  Monitor your weight at home 3)Follow up with your PCP in a week.  Follow-up with your cardiologist   Increase activity slowly   Complete by: As directed       Allergies as of 08/01/2021       Reactions   Gadolinium Derivatives Nausea And Vomiting   Nausea and vomiting was followed by epileptic seizure episode that lasted approximately 5 minutes; pt was unable to verbally communicate during that time; she then came to and was able to speak; was evaluated by  Rad and Therapist, sports; kms    Grapefruit Clinical cytogeneticist Agent] Other (See Comments)   Drug interaction        Medication List     STOP taking these medications    lisinopril 20 MG tablet Commonly known as: ZESTRIL       TAKE these medications    amLODipine 10 MG tablet Commonly known as: NORVASC Take 10 mg by mouth daily.   atorvastatin 20 MG tablet Commonly  known as: LIPITOR Take 1 tablet (20 mg total) by mouth daily.   budesonide-formoterol 160-4.5 MCG/ACT inhaler Commonly known as: Symbicort Inhale 2 puffs into the lungs in the morning and at bedtime.   busPIRone 15 MG tablet Commonly known as: BUSPAR Take 1 tablet (15 mg total) by mouth 2 (two) times daily.   furosemide 80 MG tablet Commonly known as: LASIX Take 1 tablet (80 mg total) by mouth daily.   gabapentin 300 MG capsule Commonly known as: NEURONTIN Take 3 capsules (900 mg total) by mouth 3 (three) times daily.   Jardiance 10 MG Tabs tablet Generic drug: empagliflozin Take 1 tablet (10 mg total) by mouth daily.   losartan 25 MG tablet Commonly known as: COZAAR Take 1 tablet (25 mg total) by mouth daily. Start taking on: August 02, 2021   meloxicam 15 MG tablet Commonly known as: MOBIC Take 15 mg by mouth daily.   metFORMIN 500 MG tablet Commonly known as: GLUCOPHAGE Take 500 mg by mouth 2 (two) times daily.   nicotine 14 mg/24hr patch Commonly known as: NICODERM CQ - dosed in mg/24 hours Place 1 patch (14 mg total) onto the skin daily. Start taking on: August 02, 2021   nortriptyline 75 MG capsule Commonly known as: Pamelor Take 2 capsules (150 mg total) by mouth at bedtime.   PEG 3350 17 GM/SCOOP Powd Take 17 g by mouth daily as needed (constipation).   potassium chloride SA 20 MEQ tablet Commonly known as: KLOR-CON M Take 1 tablet (20 mEq total) by mouth daily.   predniSONE 20 MG tablet Commonly known as: DELTASONE Take 2 tablets (40 mg total) by mouth daily with breakfast for 3 days. Start taking on: August 02, 2021   Ventolin HFA 108 (90 Base) MCG/ACT inhaler Generic drug: albuterol inhale 2 PUFFS into THE lungs EVERY 6 HOURS AS NEEDED FOR WHEEZING OR SHORTNESS OF BREATH   zolpidem 5 MG tablet Commonly known as: AMBIEN Take 1 tablet (5 mg total) by mouth at bedtime as needed. What changed: reasons to take this        Follow-up Information      [facility] Follow up.   Specialty: [place] Why: A representative from [facility] [place] will contact you to arrange start date and time for your therapy. Contact information: [address] [place] [address] [phone]         [facility]. Schedule an appointment as soon as possible for a visit in 1 week(s).   Specialty: Pediatrics Contact information: [place] [address] [phone]         Werner Lean, MD .   Specialty: Cardiology Contact information: [address] [name] [address] [phone]                Allergies  Allergen Reactions   Gadolinium Derivatives Nausea And Vomiting    Nausea and vomiting was followed by epileptic seizure episode that lasted approximately  5 minutes; pt was unable to verbally communicate during that time; she then came to and was able to speak; was evaluated by Rad and RN; kms    Grapefruit Flavor [Flavoring Agent] Other (See Comments)    Drug interaction    Consultations: None   Procedures/Studies: CT Angio Chest Pulmonary Embolism (PE) W or WO Contrast  Result Date: 07/29/2021 CLINICAL DATA:  Shortness of breath and known leg swelling, initial encounter EXAM: CT ANGIOGRAPHY CHEST WITH CONTRAST TECHNIQUE: Multidetector CT imaging of the chest was performed using the standard protocol during bolus administration of intravenous contrast. Multiplanar CT image reconstructions and MIPs were obtained to evaluate the vascular anatomy. RADIATION DOSE REDUCTION: This exam was performed according to the departmental dose-optimization program which includes automated exposure control, adjustment of the mA and/or kV according to patient size and/or use of iterative reconstruction technique. CONTRAST:  60m OMNIPAQUE IOHEXOL 350 MG/ML SOLN COMPARISON:  Chest x-ray from earlier in the same day. FINDINGS: Cardiovascular:  Thoracic aorta demonstrates atherosclerotic calcifications without aneurysmal dilatation. Heart is at the upper limits of normal in size. Pulmonary artery shows a normal branching pattern bilaterally. The degree of pulmonary arterial opacification is suboptimal although no pulmonary emboli are noted. No coronary calcifications are seen. Mediastinum/Nodes: Thoracic inlet is within normal limits. No hilar or mediastinal adenopathy is noted. The esophagus is within limits. Lungs/Pleura: Lungs are well aerated bilaterally. Minimal atelectatic changes are seen. No focal confluent infiltrate or effusion is noted. No parenchymal nodules are seen. Upper Abdomen: Visualized upper abdomen shows no acute abnormality. Musculoskeletal: Degenerative changes of the thoracic spine are noted. No acute rib abnormality is noted. Review of the MIP images confirms the above findings. IMPRESSION: No evidence of pulmonary emboli. Mild atelectatic changes.  No other focal abnormality is noted. Electronically Signed   By: MInez CatalinaM.D.   On: 07/29/2021 01:07   DG Chest 2 View  Result Date: 07/28/2021 CLINICAL DATA:  shortness of breath, bilateral leg and hand swelling x 2 days. EXAM: CHEST - 2 VIEW COMPARISON:  Cxr 02/24/21 FINDINGS: The heart and mediastinal contours are unchanged. Aortic calcification. No focal consolidation. No pulmonary edema. No pleural effusion. No pneumothorax. No acute osseous abnormality. IMPRESSION: 1. No active cardiopulmonary disease. 2.  Aortic Atherosclerosis (ICD10-I70.0). Electronically Signed   By: MIven FinnM.D.   On: 07/28/2021 16:01   ECHOCARDIOGRAM COMPLETE  Result Date: 07/17/2021    ECHOCARDIOGRAM REPORT   Patient Name:   Denise GARROTTDate of Exam: 07/17/2021 Medical Rec #:  [id number]              Height:       65.0 in Accession #:    [id number]             Weight:       324.8 lb Date of Birth:  807/01/1970              BSA:          2.431 m Patient Age:    89years                 BP:           130/68 mmHg Patient Gender: F                       HR:           80 bpm. Exam Location:  [address] Procedure: 2D Echo, 3D Echo, Color  Doppler and Cardiac Doppler Indications:    Murmur, Systolic G64.4  History:        Patient has prior history of Echocardiogram examinations, most                 recent 06/06/2020. Risk Factors:Hypertension, Dyslipidemia,                 Diabetes and Current Smoker.  Sonographer:    Mikki Santee RDCS Referring Phys: [id number] [facility] A CHANDRASEKHAR IMPRESSIONS  1. Left ventricular ejection fraction, by estimation, is 60 to 65%. The left ventricle has normal function. The left ventricle has no regional wall motion abnormalities. Left ventricular diastolic parameters were normal.  2. Right ventricular systolic function is normal. The right ventricular size is normal.  3. The mitral valve is normal in structure. No evidence of mitral valve regurgitation. No evidence of mitral stenosis.  4. The aortic valve is normal in structure. Aortic valve regurgitation is not visualized. No aortic stenosis is present.  5. The inferior vena cava is normal in size with greater than 50% respiratory variability, suggesting right atrial pressure of 3 mmHg.  6. Technically difficult study with limited images due to patients body habitus. No obvious valvular pathology. FINDINGS  Left Ventricle: Left ventricular ejection fraction, by estimation, is 60 to 65%. The left ventricle has normal function. The left ventricle has no regional wall motion abnormalities. The left ventricular internal cavity size was normal in size. There is  no left ventricular hypertrophy. Left ventricular diastolic parameters were normal. Right Ventricle: The right ventricular size is normal. No increase in right ventricular wall thickness. Right ventricular systolic function is normal. Left Atrium: Left atrial size was normal in size. Right Atrium: Right atrial size was normal in size. Pericardium: There  is no evidence of pericardial effusion. Mitral Valve: The mitral valve is normal in structure. No evidence of mitral valve regurgitation. No evidence of mitral valve stenosis. Tricuspid Valve: The tricuspid valve is normal in structure. Tricuspid valve regurgitation is not demonstrated. No evidence of tricuspid stenosis. Aortic Valve: The aortic valve is normal in structure. Aortic valve regurgitation is not visualized. No aortic stenosis is present. Pulmonic Valve: The pulmonic valve was normal in structure. Pulmonic valve regurgitation is not visualized. No evidence of pulmonic stenosis. Aorta: The aortic root is normal in size and structure. Ascending aorta measurements are within normal limits for age when indexed to body surface area. Venous: The inferior vena cava was not well visualized. The inferior vena cava is normal in size with greater than 50% respiratory variability, suggesting right atrial pressure of 3 mmHg. IAS/Shunts: No atrial level shunt detected by color flow Doppler.  LEFT VENTRICLE PLAX 2D LVIDd:         4.80 cm   Diastology LVIDs:         2.90 cm   LV e' medial:    10.70 cm/s LV PW:         1.00 cm   LV E/e' medial:  8.3 LV IVS:        1.00 cm   LV e' lateral:   10.70 cm/s LVOT diam:     2.10 cm   LV E/e' lateral: 8.3 LV SV:         77 LV SV Index:   32 LVOT Area:     3.46 cm  3D Volume EF:                          3D EF:        61 %                          LV EDV:       148 ml                          LV ESV:       58 ml                          LV SV:        90 ml RIGHT VENTRICLE RV Basal diam:  3.80 cm RV Mid diam:    3.30 cm RV S prime:     11.10 cm/s LEFT ATRIUM             Index        RIGHT ATRIUM           Index LA diam:        3.70 cm 1.52 cm/m   RA Area:     12.70 cm LA Vol (A2C):   32.7 ml 13.45 ml/m  RA Volume:   25.90 ml  10.65 ml/m LA Vol (A4C):   27.1 ml 11.15 ml/m LA Biplane Vol: 31.2 ml 12.84 ml/m  AORTIC VALVE LVOT Vmax:   111.00 cm/s LVOT  Vmean:  72.100 cm/s LVOT VTI:    0.222 m  AORTA Ao Root diam: 2.70 cm Ao Asc diam:  3.70 cm MITRAL VALVE MV Area (PHT): 3.83 cm    SHUNTS MV Decel Time: 198 msec    Systemic VTI:  0.22 m MV E velocity: 88.90 cm/s  Systemic Diam: 2.10 cm MV A velocity: 94.30 cm/s MV E/A ratio:  0.94 Glori Bickers MD Electronically signed by Glori Bickers MD Signature Date/Time: 07/17/2021/7:06:35 PM    Final       Subjective:  Patient seen and examined at the bedside this morning.  Hemodynamically stable for discharge today. Discharge Exam: Vitals:   08/01/21 0351 08/01/21 0604  BP: 126/75 (!) 150/96  Pulse: 80   Resp: 16   Temp: 97.6 F (36.4 C)   SpO2: 96%    Vitals:   07/31/21 2018 07/31/21 2341 08/01/21 0351 08/01/21 0604  BP: 132/82 127/79 126/75 (!) 150/96  Pulse: 95 96 80   Resp: '18 16 16   '$ Temp: 97.7 F (36.5 C) (!) 97.2 F (36.2 C) 97.6 F (36.4 C)   TempSrc: Oral Axillary Oral   SpO2: 95% 92% 96%   Weight:   (!) 148.7 kg   Height:        General: Pt is alert, awake, not in acute distress, obese Cardiovascular: RRR, S1/S2 +, no rubs, no gallops Respiratory: CTA bilaterally, no wheezing, no rhonchi Abdominal: Soft, NT, ND, bowel sounds + Extremities: no edema, no cyanosis    The results of significant diagnostics from this hospitalization (including imaging, microbiology, ancillary and laboratory) are listed below for reference.     Microbiology: No results found for this or any previous visit (from the past 240 hour(s)).   Labs: BNP (last 3 results) Recent Labs    10/13/20 1602 02/01/21 1130 07/28/21 1715  BNP 11.4 14.9 6.3   Basic Metabolic Panel: Recent Labs  Lab 07/28/21 1535 07/29/21 0449 07/30/21 0258 07/31/21 0323 08/01/21 0236  NA 143 143 141 141 138  K 3.5 3.8 3.8 4.1 4.6  CL 103 104 104 103 100  CO2 '24 28 28 30 30  '$ GLUCOSE 99 115* 134* 125* 126*  BUN 5* '8 9 7 11  '$ CREATININE 0.83 0.78 0.94 0.90 0.83  CALCIUM 9.3 8.9 9.3 9.4 9.9  MG  --  2.1   --   --  2.5*   Liver Function Tests: Recent Labs  Lab 07/28/21 1535  AST 19  ALT 23  ALKPHOS 107  BILITOT 0.3  PROT 6.7  ALBUMIN 3.6   No results for input(s): "LIPASE", "AMYLASE" in the last 168 hours. No results for input(s): "AMMONIA" in the last 168 hours. CBC: Recent Labs  Lab 07/28/21 1535 07/29/21 0449  WBC 11.9* 9.2  HGB 13.2 13.2  HCT 41.3 41.8  MCV 90.2 90.1  PLT 358 332   Cardiac Enzymes: No results for input(s): "CKTOTAL", "CKMB", "CKMBINDEX", "TROPONINI" in the last 168 hours. BNP: Invalid input(s): "POCBNP" CBG: Recent Labs  Lab 07/31/21 0749 07/31/21 1155 07/31/21 1604 07/31/21 2146 08/01/21 0732  GLUCAP 141* 138* 201* 175* 130*   D-Dimer No results for input(s): "DDIMER" in the last 72 hours. Hgb A1c No results for input(s): "HGBA1C" in the last 72 hours. Lipid Profile No results for input(s): "CHOL", "HDL", "LDLCALC", "TRIG", "CHOLHDL", "LDLDIRECT" in the last 72 hours. Thyroid function studies No results for input(s): "TSH", "T4TOTAL", "T3FREE", "THYROIDAB" in the last 72 hours.  Invalid input(s): "FREET3" Anemia work up No results for input(s): "VITAMINB12", "FOLATE", "FERRITIN", "TIBC", "IRON", "RETICCTPCT" in the last 72 hours. Urinalysis    Component Value Date/Time   COLORURINE YELLOW 02/01/2021 1130   APPEARANCEUR CLEAR 02/01/2021 1130   LABSPEC 1.020 02/01/2021 1130   PHURINE 6.0 02/01/2021 1130   GLUCOSEU NEGATIVE 02/01/2021 1130   HGBUR NEGATIVE 02/01/2021 1130   BILIRUBINUR NEGATIVE 02/01/2021 1130   KETONESUR 5 (A) 02/01/2021 1130   PROTEINUR NEGATIVE 02/01/2021 1130   UROBILINOGEN 0.2 11/22/2014 0240   NITRITE NEGATIVE 02/01/2021 1130   LEUKOCYTESUR NEGATIVE 02/01/2021 1130   Sepsis Labs Recent Labs  Lab 07/28/21 1535 07/29/21 0449  WBC 11.9* 9.2   Microbiology No results found for this or any previous visit (from the past 240 hour(s)).  Please note: You were cared for by a hospitalist during your hospital  stay. Once you are discharged, your primary care physician will handle any further medical issues. Please note that NO REFILLS for any discharge medications will be authorized once you are discharged, as it is imperative that you return to your primary care physician (or establish a relationship with a primary care physician if you do not have one) for your post hospital discharge needs so that they can reassess your need for medications and monitor your lab values.    Time coordinating discharge: 40 minutes  SIGNED:   Shelly Coss, MD  [facility] 08/01/2021, 10:55 AM Pager [phone]  If 7PM-7AM, please contact night-coverage [email or web address] Password [email or web address]

## 2021-08-01 NOTE — Evaluation (Signed)
Occupational Therapy Evaluation Patient Details Name: Denise [name] MRN: [id number] DOB: 1970-11-03 Today's Date: 08/01/2021   History of Present Illness Pt adm 7/4 with acute on chronic diastolic CHF. PMH - memory changes, anxiety, obesity, hypertension, OSA, history of TIA   Clinical Impression   Patient admitted for the diagnosis above.  PTA she lives in a small apartment with her spouse.  Spouse assists with ADL and meals.  Patient will use [employer] for community mobility, and has an aide for 2hours/day a few times during the week for home management.  Patient scheduled for discharge today, and would benefit from [facility] OT, but if this is considered charity because of Medicaid, [facility] would be more important.  Patient looking to get a tub bench for home use, currently sponge bathing.  Acute OT will defer to the next level of care.       Recommendations for follow up therapy are one component of a multi-disciplinary discharge planning process, led by the attending physician.  Recommendations may be updated based on patient status, additional functional criteria and insurance authorization.   Follow Up Recommendations  Home health OT    Assistance Recommended at Discharge Intermittent Supervision/Assistance  Patient can return home with the following      Functional Status Assessment  Patient has had a recent decline in their functional status and demonstrates the ability to make significant improvements in function in a reasonable and predictable amount of time.  Equipment Recommendations  Tub/shower bench    Recommendations for Other Services       Precautions / Restrictions Precautions Precautions: Fall Restrictions Weight Bearing Restrictions: No      Mobility Bed Mobility Overal bed mobility: Modified Independent                  Transfers Overall transfer level: Needs assistance Equipment used: Rollator (4 wheels) Transfers: Sit to/from Stand Sit to  Stand: Supervision, Min guard                  Balance Overall balance assessment: Needs assistance Sitting-balance support: No upper extremity supported, Feet supported Sitting balance-Leahy Scale: Good     Standing balance support: No upper extremity supported, During functional activity Standing balance-Leahy Scale: Fair                             ADL either performed or assessed with clinical judgement   ADL       Grooming: Wash/dry hands;Wash/dry face;Set up;Sitting           Upper Body Dressing : Moderate assistance;Sitting   Lower Body Dressing: Maximal assistance;Sit to/from stand   Toilet Transfer: Minimal assistance;Rollator (4 wheels)                   Vision Patient Visual Report: No change from baseline       Perception Perception Perception: Not tested   Praxis Praxis Praxis: Not tested    Pertinent Vitals/Pain Pain Assessment Pain Assessment: No/denies pain     Hand Dominance Right   Extremity/Trunk Assessment Upper Extremity Assessment Upper Extremity Assessment: Generalized weakness   Lower Extremity Assessment Lower Extremity Assessment: Defer to PT evaluation   Cervical / Trunk Assessment Cervical / Trunk Assessment: Other exceptions Cervical / Trunk Exceptions: BMI   Communication Communication Communication: No difficulties   Cognition Arousal/Alertness: Awake/alert Behavior During Therapy: WFL for tasks assessed/performed Overall Cognitive Status: History of cognitive impairments - at baseline  General Comments: Pt with history of memory deficits                      Home Living Family/patient expects to be discharged to:: Private residence Living Arrangements: Spouse/significant other;Children Available Help at Discharge: Family;Available 24 hours/day Type of Home: Apartment Home Access: Level entry     Home Layout: One level      Bathroom Shower/Tub: Teacher, early years/pre: Standard Bathroom Accessibility: No   Home Equipment: Rollator (4 wheels);Other (comment)   Additional Comments: Home O2 at night and PRN during the day      Prior Functioning/Environment Prior Level of Function : Needs assist             Mobility Comments: Pt uses rollator for short household distance. Very sedentary ADLs Comments: Spouse assists with bathing when she gets washed up, and aide 2x/wk for home management        OT Problem List: Decreased strength;Decreased activity tolerance;Impaired balance (sitting and/or standing);Obesity      OT Treatment/Interventions:      OT Goals(Current goals can be found in the care plan section) Acute Rehab OT Goals Patient Stated Goal: Return home OT Goal Formulation: With patient Time For Goal Achievement: 08/07/21 Potential to Achieve Goals: Good  OT Frequency:      Co-evaluation              AM-PAC OT "6 Clicks" Daily Activity     Outcome Measure Help from another person eating meals?: None Help from another person taking care of personal grooming?: None Help from another person toileting, which includes using toliet, bedpan, or urinal?: A Little Help from another person bathing (including washing, rinsing, drying)?: A Lot Help from another person to put on and taking off regular upper body clothing?: A Lot Help from another person to put on and taking off regular lower body clothing?: A Lot 6 Click Score: 17   End of Session Equipment Utilized During Treatment: Rollator (4 wheels) Nurse Communication: Mobility status  Activity Tolerance: Patient limited by fatigue Patient left: in bed;with call bell/phone within reach  OT Visit Diagnosis: Unsteadiness on feet (R26.81);Muscle weakness (generalized) (M62.81)                Time: 1157-2620 OT Time Calculation (min): 18 min Charges:  OT General Charges $OT Visit: 1 Visit OT Evaluation $OT Eval  Moderate Complexity: 1 Mod  08/01/2021  RP, OTR/L  Acute Rehabilitation Services  Office:  [phone]   Metta Clines 08/01/2021, 12:18 PM

## 2021-08-04 ENCOUNTER — Other Ambulatory Visit: Payer: Self-pay | Admitting: Internal Medicine

## 2021-08-07 ENCOUNTER — Ambulatory Visit: Payer: Medicaid Other | Admitting: Podiatry

## 2021-08-10 ENCOUNTER — Other Ambulatory Visit: Payer: Medicaid Other

## 2021-08-13 ENCOUNTER — Telehealth (HOSPITAL_BASED_OUTPATIENT_CLINIC_OR_DEPARTMENT_OTHER): Payer: Medicaid Other | Admitting: Psychiatry

## 2021-08-13 DIAGNOSIS — F333 Major depressive disorder, recurrent, severe with psychotic symptoms: Secondary | ICD-10-CM

## 2021-08-13 DIAGNOSIS — G4701 Insomnia due to medical condition: Secondary | ICD-10-CM | POA: Diagnosis not present

## 2021-08-13 DIAGNOSIS — F4312 Post-traumatic stress disorder, chronic: Secondary | ICD-10-CM

## 2021-08-13 DIAGNOSIS — F411 Generalized anxiety disorder: Secondary | ICD-10-CM | POA: Diagnosis not present

## 2021-08-13 MED ORDER — NORTRIPTYLINE HCL 75 MG PO CAPS: 150.0000 mg | ORAL_CAPSULE | Freq: Every day | ORAL | 0 refills | Status: DC

## 2021-08-13 MED ORDER — ZOLPIDEM TARTRATE 5 MG PO TABS: 5.0000 mg | ORAL_TABLET | Freq: Every evening | ORAL | 0 refills | Status: DC | PRN

## 2021-08-13 NOTE — Progress Notes (Signed)
Virtual Visit via phone Note  I connected with Denise [name] on 08/13/21 at 10:30 AM EDT by  phone. I  verified that I am speaking with the correct person using two identifiers.  Location: Patient: home Provider: office   I discussed the limitations of evaluation and management by telemedicine and the availability of in person appointments. The patient expressed understanding and agreed to proceed.  History of Present Illness: "I am up and down". Her son is about to start school and she needs to get him supplies. They have no transportation and it is a big stressor. She wants to have a better relationship with her son. He is 53 years old. Her depression was getting better with improved sleep from Ambien. She states the Ambien was very effective and she was sleeping well but can't say how many hours. She has run out and is back to having poor sleep. Her PTSD has been worse lately. She is having flashbacks about 3x/day and is having intrusive memories all day. Her depression is worse. She has been very irritable and is snapping at her family. Her anxiety is worse. She is endorsing near daily anhedonia and worthlessness. Her appetite is increased at night and she will snack all night. She denies passive thoughts of death and denies SI/HI.    Observations/Objective: Psychiatric Specialty Exam:  General Appearance: unable to assess  Eye Contact:  unable to assess  Speech:  Clear and Coherent and Normal Rate  Volume:  Normal  Mood:  Anxious and Depressed  Affect:  Congruent  Thought Process: concrete, slow Coherent and Descriptions of Associations: Circumstantial  Orientation:  Full (Time, Place, and Person)  Thought Content:  Rumination  Suicidal Thoughts:  No  Homicidal Thoughts:  No  Memory:  Immediate;   Poor  Judgement:  Fair  Insight:  Shallow  Psychomotor Activity: unable to assess  Concentration:  Concentration: Poor  Recall:  Poor  Fund of Knowledge:  Fair  Language:   Fair  Akathisia:  unable to assess  Handed:  unable to assess  AIMS (if indicated):     Assets:  Desire for Improvement Financial Resources/Insurance Housing Intimacy Social Support Talents/Skills Vocational/Educational  ADL's:  unable to assess  Cognition:  WNL  Sleep:         Assessment and Plan:     08/13/2021   10:42 AM 06/25/2021    1:45 PM 05/14/2021    8:44 AM 04/23/2021    9:09 AM 02/26/2021   10:18 AM  Depression screen PHQ 2/9  Decreased Interest 3 2 0 3 3  Down, Depressed, Hopeless '3 3 3 3 3  '$ PHQ - 2 Score '6 5 3 6 6  '$ Altered sleeping '3 2 3 2 3  '$ Tired, decreased energy '3 3 3 3 3  '$ Change in appetite '2 2 3 3 3  '$ Feeling bad or failure about yourself  '3 3 2 3 3  '$ Trouble concentrating '3 3 3 3 3  '$ Moving slowly or fidgety/restless 3 0 0 3 0  Suicidal thoughts 0 0 0 0 0  PHQ-9 Score '23 18 17 23 21  '$ Difficult doing work/chores Extremely dIfficult Extremely dIfficult Extremely dIfficult Extremely dIfficult Extremely dIfficult    Flowsheet Row Video Visit from 08/13/2021 in [facility] ED to Hosp-Admission (Discharged) from 07/28/2021 in [facility] Visit from 06/25/2021 in [facility]  C-SSRS RISK CATEGORY No Risk No Risk No Risk  Status of current problems: ongoing depression and anxiety  Meds: d/c Buspar. I will hold off on starting anything new today. She is still on a large number of sedating meds that could be contributing to her poor energy, concentration and memory. She stated Ambien improved her sleep and overall mood. I will follow up with her in 2-3 weeks and make further recommendations at that time.   1. Severe episode of recurrent major depressive disorder, with psychotic features ([place]) - nortriptyline (PAMELOR) 75 MG capsule; Take 2 capsules (150 mg total) by mouth at bedtime.  Dispense: 180 capsule; Refill: 0  2. GAD (generalized anxiety  disorder) - nortriptyline (PAMELOR) 75 MG capsule; Take 2 capsules (150 mg total) by mouth at bedtime.  Dispense: 180 capsule; Refill: 0  3. Insomnia due to medical condition - zolpidem (AMBIEN) 5 MG tablet; Take 1 tablet (5 mg total) by mouth at bedtime as needed.  Dispense: 30 tablet; Refill: 0  4. Chronic post-traumatic stress disorder (PTSD)     Labs: none today    Therapy: brief supportive therapy provided.    Collaboration of Care: Referral or follow-up with counselor/therapist AEB family therapy  Patient/Guardian was advised Release of Information must be obtained prior to any record release in order to collaborate their care with an outside provider. Patient/Guardian was advised if they have not already done so to contact the registration department to sign all necessary forms in order for [place] to release information regarding their care.   Consent: Patient/Guardian gives verbal consent for treatment and assignment of benefits for services provided during this visit. Patient/Guardian expressed understanding and agreed to proceed.       Follow Up Instructions: Follow up in 2-3 weeks or sooner if needed    I discussed the assessment and treatment plan with the patient. The patient was provided an opportunity to ask questions and all were answered. The patient agreed with the plan and demonstrated an understanding of the instructions.   The patient was advised to call back or seek an in-person evaluation if the symptoms worsen or if the condition fails to improve as anticipated.  I provided 16 minutes of non-face-to-face time during this encounter.   Charlcie Cradle, MD

## 2021-08-17 ENCOUNTER — Other Ambulatory Visit: Payer: Self-pay

## 2021-08-17 ENCOUNTER — Encounter (HOSPITAL_COMMUNITY): Payer: Self-pay | Admitting: Emergency Medicine

## 2021-08-17 ENCOUNTER — Emergency Department (HOSPITAL_COMMUNITY): Payer: Medicaid Other

## 2021-08-17 ENCOUNTER — Emergency Department (HOSPITAL_COMMUNITY)
Admission: EM | Admit: 2021-08-17 | Discharge: 2021-08-17 | Disposition: A | Discharge status: Home / Self Care | Payer: Medicaid Other | Attending: Emergency Medicine | Admitting: Emergency Medicine

## 2021-08-17 DIAGNOSIS — I5030 Unspecified diastolic (congestive) heart failure: Secondary | ICD-10-CM | POA: Diagnosis not present

## 2021-08-17 DIAGNOSIS — Z556 Problems related to health literacy: Secondary | ICD-10-CM

## 2021-08-17 DIAGNOSIS — E119 Type 2 diabetes mellitus without complications: Secondary | ICD-10-CM | POA: Diagnosis not present

## 2021-08-17 DIAGNOSIS — Z79899 Other long term (current) drug therapy: Secondary | ICD-10-CM | POA: Insufficient documentation

## 2021-08-17 DIAGNOSIS — R079 Chest pain, unspecified: Secondary | ICD-10-CM | POA: Insufficient documentation

## 2021-08-17 DIAGNOSIS — R0609 Other forms of dyspnea: Secondary | ICD-10-CM | POA: Diagnosis not present

## 2021-08-17 DIAGNOSIS — I5032 Chronic diastolic (congestive) heart failure: Secondary | ICD-10-CM | POA: Diagnosis present

## 2021-08-17 DIAGNOSIS — R0602 Shortness of breath: Secondary | ICD-10-CM | POA: Insufficient documentation

## 2021-08-17 DIAGNOSIS — Z9989 Dependence on other enabling machines and devices: Secondary | ICD-10-CM

## 2021-08-17 DIAGNOSIS — E785 Hyperlipidemia, unspecified: Secondary | ICD-10-CM

## 2021-08-17 DIAGNOSIS — J449 Chronic obstructive pulmonary disease, unspecified: Secondary | ICD-10-CM | POA: Insufficient documentation

## 2021-08-17 DIAGNOSIS — G4733 Obstructive sleep apnea (adult) (pediatric): Secondary | ICD-10-CM

## 2021-08-17 DIAGNOSIS — E1169 Type 2 diabetes mellitus with other specified complication: Secondary | ICD-10-CM | POA: Diagnosis present

## 2021-08-17 DIAGNOSIS — R2243 Localized swelling, mass and lump, lower limb, bilateral: Secondary | ICD-10-CM | POA: Diagnosis not present

## 2021-08-17 DIAGNOSIS — R0600 Dyspnea, unspecified: Secondary | ICD-10-CM | POA: Diagnosis not present

## 2021-08-17 DIAGNOSIS — R140 Abdominal distension (gaseous): Secondary | ICD-10-CM | POA: Insufficient documentation

## 2021-08-17 DIAGNOSIS — Z7984 Long term (current) use of oral hypoglycemic drugs: Secondary | ICD-10-CM | POA: Insufficient documentation

## 2021-08-17 LAB — CBC WITH DIFFERENTIAL/PLATELET
Abs Immature Granulocytes: 0.08 10*3/uL — ABNORMAL HIGH (ref 0.00–0.07)
Basophils Absolute: 0 10*3/uL (ref 0.0–0.1)
Basophils Relative: 0 %
Eosinophils Absolute: 0.2 10*3/uL (ref 0.0–0.5)
Eosinophils Relative: 1 %
HCT: 41.7 % (ref 36.0–46.0)
Hemoglobin: 13.3 g/dL (ref 12.0–15.0)
Immature Granulocytes: 1 %
Lymphocytes Relative: 33 %
Lymphs Abs: 4.3 10*3/uL — ABNORMAL HIGH (ref 0.7–4.0)
MCH: 28.7 pg (ref 26.0–34.0)
MCHC: 31.9 g/dL (ref 30.0–36.0)
MCV: 90.1 fL (ref 80.0–100.0)
Monocytes Absolute: 0.9 10*3/uL (ref 0.1–1.0)
Monocytes Relative: 7 %
Neutro Abs: 7.6 10*3/uL (ref 1.7–7.7)
Neutrophils Relative %: 58 %
Platelets: 356 10*3/uL (ref 150–400)
RBC: 4.63 MIL/uL (ref 3.87–5.11)
RDW: 15 % (ref 11.5–15.5)
WBC: 13.1 10*3/uL — ABNORMAL HIGH (ref 4.0–10.5)
nRBC: 0.2 % (ref 0.0–0.2)

## 2021-08-17 LAB — COMPREHENSIVE METABOLIC PANEL
ALT: 20 U/L (ref 0–44)
AST: 15 U/L (ref 15–41)
Albumin: 3.7 g/dL (ref 3.5–5.0)
Alkaline Phosphatase: 105 U/L (ref 38–126)
Anion gap: 10 (ref 5–15)
BUN: 7 mg/dL (ref 6–20)
CO2: 25 mmol/L (ref 22–32)
Calcium: 9.5 mg/dL (ref 8.9–10.3)
Chloride: 106 mmol/L (ref 98–111)
Creatinine, Ser: 1.01 mg/dL — ABNORMAL HIGH (ref 0.44–1.00)
GFR, Estimated: >60 mL/min (ref >60)
Glucose, Bld: 86 mg/dL (ref 70–99)
Potassium: 4.3 mmol/L (ref 3.5–5.1)
Sodium: 141 mmol/L (ref 135–145)
Total Bilirubin: 0.4 mg/dL (ref 0.3–1.2)
Total Protein: 6.7 g/dL (ref 6.5–8.1)

## 2021-08-17 LAB — TROPONIN I (HIGH SENSITIVITY)
Troponin I (High Sensitivity): 5 ng/L (ref <18)
Troponin I (High Sensitivity): 6 ng/L (ref <18)

## 2021-08-17 LAB — BRAIN NATRIURETIC PEPTIDE: B Natriuretic Peptide: 8 pg/mL (ref 0.0–100.0)

## 2021-08-17 LAB — D-DIMER, QUANTITATIVE: D-Dimer, Quant: 0.35 ug/mL-FEU (ref 0.00–0.50)

## 2021-08-17 MED ORDER — ASPIRIN 81 MG PO CHEW
324.0000 mg | CHEWABLE_TABLET | Freq: Once | ORAL | Status: AC
  Administered 2021-08-17: 324 mg via ORAL
  Filled 2021-08-17: qty 4

## 2021-08-17 MED ORDER — FUROSEMIDE 10 MG/ML IJ SOLN
80.0000 mg | Freq: Once | INTRAMUSCULAR | Status: AC
  Administered 2021-08-17: 80 mg via INTRAVENOUS
  Filled 2021-08-17: qty 8

## 2021-08-17 NOTE — Assessment & Plan Note (Signed)
Continue metformin and jardiance. Pt not following diabetic diet at home.

## 2021-08-17 NOTE — Consult Note (Signed)
History and Physical    Denise [name] NWG:[id number] DOB: 1970-08-18 DOA: 08/17/2021  DOS: the patient was seen and examined on 08/17/2021  [facility]   Patient coming from: Home  I have personally briefly reviewed patient's old medical records in [place]  CC: SOB HPI: 51 year old African-American female with a history of obesity BMI greater than 35, chronic hypoxic respiratory failure on home oxygen at 2 L a minute, OSA on CPAP, diastolic heart failure presents to the ER today from home due to dyspnea on exertion.  Patient was discharged recently from the hospital with a portable O2 tank to go home.  She ready has a oxygen concentrator at home.  Patient does not have a refillable portable tank to use when she is out of the house.  Patient has extremely low health literacy.  She was not aware she needed to change over to a supplemental nasal cannula during the day when she is not sleeping.  After discussion with the patient's husband Juanda Crumble via phone, it appears that the patient's been wearing her CPAP machine with O2 bleed and constantly throughout the day.  The only time she takes this office to eat, go to the bathroom, go outside to smoke.  During this time she takes off her CPAP and does not wear any supplemental oxygen at all.  She gets extremely short of breath when she takes off her CPAP which also means that she is not getting any supplemental oxygen.  When EMS arrived, she was 88% on room air.  Patient and her husband both state that when her oxygen concentrator was delivered, did not receive a refill portable O2 tank for when she is outside the home.  Patient and her husband live in a motel room with a kitchenette.  They state that the oxygen concentrator and her CPAP machine are about 3 feet from her bed.  Patient denies having a nasal cannula in her motel room that she can use during the day.  Patient also admits to  drinking more than 60 ounces of water a day.  She states that she drinks 16 ounce bottles and drinks more than 6 of them a day.  She probably consumes at least 120 ounces of water a day.  They eat mostly fast food at home.  She is not hearing to his low-salt diet.  She also continues to smoke at least a pack a day of cigarettes.  Work-up here in the ER:  BNP was normal at 8.  CMP was essentially normal.  White count was 13, hemoglobin is 13.3  Chest x-ray was negative for pulmonary edema.  [facility] hospitalist contacted for consult.   ED Course: workup negative for acute CHF  Review of Systems:  Review of Systems  Constitutional: Negative.   HENT: Negative.    Eyes: Negative.   Respiratory:  Positive for shortness of breath.   Cardiovascular:  Positive for leg swelling.       Chronic DOE. In the face of her not wearing supplemental O2 when ambulating.  Gastrointestinal: Negative.   Genitourinary: Negative.   Musculoskeletal: Negative.   Skin: Negative.   Neurological: Negative.   Endo/Heme/Allergies: Negative.   Psychiatric/Behavioral: Negative.    All other systems reviewed and are negative.   Past Medical History:  Diagnosis Date   Abnormal EKG    AD (atopic dermatitis)    Alcohol use 04/14/2015   Anxiety    Arthritis    Blood transfusion without  reported diagnosis    Depression    Diabetes mellitus without complication ([place])    Fibroid uterus 12/18/2014   Hyperlipemia    Hypertension    Lithium toxicity 09/25/2019   Memory changes    Overactive bladder    Pelvic pain in female 12/18/2014   Prediabetes    Right sided weakness    Seizures ([place])    Stroke [facility])    Substance abuse ([place])    Syncope 02/07/2015   TIA (transient ischemic attack)    Tobacco use 04/14/2015    Past Surgical History:  Procedure Laterality Date   CESAREAN SECTION     x 2     reports that she has been smoking cigarettes. She has been smoking an average of .4 packs per day. She  has never used smokeless tobacco. She reports that she does not currently use alcohol after a past usage of about 2.0 standard drinks of alcohol per week. She reports that she does not currently use drugs after having used the following drugs: "Crack" cocaine.  Allergies  Allergen Reactions   Gadolinium Derivatives Nausea And Vomiting    Nausea and vomiting was followed by epileptic seizure episode that lasted approximately 5 minutes; pt was unable to verbally communicate during that time; she then came to and was able to speak; was evaluated by Rad and RN; kms    Grapefruit Flavor [Flavoring Agent] Other (See Comments)    Drug interaction    Family History  Problem Relation Age of Onset   Stroke Mother    Anxiety disorder Mother    Drug abuse Mother    Alcohol abuse Mother    Depression Mother    Drug abuse Father    Anxiety disorder Father    Heart disease Maternal Grandmother    Colon cancer Neg Hx    Esophageal cancer Neg Hx    Pancreatic cancer Neg Hx    Stomach cancer Neg Hx    Liver disease Neg Hx    Breast cancer Neg Hx     Prior to Admission medications   Medication Sig Start Date End Date Taking? Authorizing Provider  amLODipine (NORVASC) 10 MG tablet Take 10 mg by mouth daily.    [provider]  atorvastatin (LIPITOR) 20 MG tablet TAKE 1 TABLET BY MOUTH EVERY DAY 08/04/21   Chandrasekhar, Mahesh A, MD  budesonide-formoterol (SYMBICORT) 160-4.5 MCG/ACT inhaler Inhale 2 puffs into the lungs in the morning and at bedtime. 03/02/21   Chesley Mires, MD  empagliflozin (JARDIANCE) 10 MG TABS tablet Take 1 tablet (10 mg total) by mouth daily. 08/01/21   Shelly Coss, MD  furosemide (LASIX) 80 MG tablet Take 1 tablet (80 mg total) by mouth daily. 08/01/21   Shelly Coss, MD  gabapentin (NEURONTIN) 300 MG capsule Take 3 capsules (900 mg total) by mouth 3 (three) times daily. 07/23/21   Pieter Partridge, DO  losartan (COZAAR) 25 MG tablet Take 1 tablet (25 mg total) by mouth  daily. 08/02/21   Shelly Coss, MD  meloxicam (MOBIC) 15 MG tablet Take 15 mg by mouth daily. Patient not taking: Reported on 08/13/2021 07/23/21   [provider]  metFORMIN (GLUCOPHAGE) 500 MG tablet Take 500 mg by mouth 2 (two) times daily. 05/25/17   [provider]  nicotine (NICODERM CQ - DOSED IN MG/24 HOURS) 14 mg/24hr patch Place 1 patch (14 mg total) onto the skin daily. 08/02/21   Shelly Coss, MD  nortriptyline (PAMELOR) 75 MG capsule Take 2  capsules (150 mg total) by mouth at bedtime. 08/13/21 08/13/22  Charlcie Cradle, MD  Polyethylene Glycol 3350 (PEG 3350) 17 GM/SCOOP POWD Take 17 g by mouth daily as needed (constipation). 11/17/20   [provider]  potassium chloride SA (KLOR-CON M) 20 MEQ tablet Take 1 tablet (20 mEq total) by mouth daily. 08/01/21   Shelly Coss, MD  VENTOLIN HFA 108 (90 Base) MCG/ACT inhaler inhale 2 PUFFS into THE lungs EVERY 6 HOURS AS NEEDED FOR WHEEZING OR SHORTNESS OF BREATH 04/15/21   Chesley Mires, MD  zolpidem (AMBIEN) 5 MG tablet Take 1 tablet (5 mg total) by mouth at bedtime as needed. 08/13/21   Charlcie Cradle, MD    Physical Exam: Vitals:   08/17/21 1800 08/17/21 1850 08/17/21 1900 08/17/21 1955  BP: 139/77 (!) 159/83 137/86 (!) 136/98  Pulse: 78 (!) 108 94 94  Resp: (!) 21 (!) 22  (!) 24  Temp: 97.8 F (36.6 C)     TempSrc:      SpO2: 97% 96% 96% 95%    Physical Exam Vitals and nursing note reviewed.  Constitutional:      General: She is not in acute distress.    Appearance: She is obese. She is not ill-appearing, toxic-appearing or diaphoretic.  HENT:     Head: Normocephalic and atraumatic.     Nose: Nose normal. No rhinorrhea.  Eyes:     General: No scleral icterus. Cardiovascular:     Rate and Rhythm: Normal rate and regular rhythm.     Pulses: Normal pulses.  Pulmonary:     Effort: Pulmonary effort is normal. No respiratory distress.     Comments: Scattered Rales at bases Abdominal:     General:  Abdomen is protuberant. Bowel sounds are normal. There is no distension.     Palpations: Abdomen is soft.     Tenderness: There is no abdominal tenderness. There is no guarding.  Musculoskeletal:     Comments: Trace pretibial edema bilaterally  Skin:    Capillary Refill: Capillary refill takes less than 2 seconds.  Neurological:     General: No focal deficit present.     Mental Status: She is alert and oriented to person, place, and time.      Labs on Admission: I have personally reviewed following labs and imaging studies  CBC: Recent Labs  Lab 08/17/21 1206  WBC 13.1*  NEUTROABS 7.6  HGB 13.3  HCT 41.7  MCV 90.1  PLT 324   Basic Metabolic Panel: Recent Labs  Lab 08/17/21 1206  NA 141  K 4.3  CL 106  CO2 25  GLUCOSE 86  BUN 7  CREATININE 1.01*  CALCIUM 9.5   GFR: CrCl cannot be calculated (Unknown ideal weight.). Liver Function Tests: Recent Labs  Lab 08/17/21 1206  AST 15  ALT 20  ALKPHOS 105  BILITOT 0.4  PROT 6.7  ALBUMIN 3.7   No results for input(s): "LIPASE", "AMYLASE" in the last 168 hours. No results for input(s): "AMMONIA" in the last 168 hours. Coagulation Profile: No results for input(s): "INR", "PROTIME" in the last 168 hours. Cardiac Enzymes: Recent Labs  Lab 08/17/21 1206 08/17/21 1743  TROPONINIHS 5 6   BNP (last 3 results) Recent Labs    03/19/21 0728 06/29/21 0857  PROBNP <36 <36   HbA1C: No results for input(s): "HGBA1C" in the last 72 hours. CBG: No results for input(s): "GLUCAP" in the last 168 hours. Lipid Profile: No results for input(s): "CHOL", "HDL", "LDLCALC", "TRIG", "CHOLHDL", "LDLDIRECT"  in the last 72 hours. Thyroid Function Tests: No results for input(s): "TSH", "T4TOTAL", "FREET4", "T3FREE", "THYROIDAB" in the last 72 hours. Anemia Panel: No results for input(s): "VITAMINB12", "FOLATE", "FERRITIN", "TIBC", "IRON", "RETICCTPCT" in the last 72 hours. Urine analysis:    Component Value Date/Time    COLORURINE YELLOW 02/01/2021 1130   APPEARANCEUR CLEAR 02/01/2021 1130   LABSPEC 1.020 02/01/2021 1130   PHURINE 6.0 02/01/2021 1130   GLUCOSEU NEGATIVE 02/01/2021 1130   HGBUR NEGATIVE 02/01/2021 1130   BILIRUBINUR NEGATIVE 02/01/2021 1130   KETONESUR 5 (A) 02/01/2021 1130   PROTEINUR NEGATIVE 02/01/2021 1130   UROBILINOGEN 0.2 11/22/2014 0240   NITRITE NEGATIVE 02/01/2021 1130   LEUKOCYTESUR NEGATIVE 02/01/2021 1130    Radiological Exams on Admission: I have personally reviewed images DG Chest 2 View  Result Date: 08/17/2021 CLINICAL DATA:  Shortness of breath EXAM: CHEST - 2 VIEW COMPARISON:  07/28/2021 FINDINGS: Transverse diameter heart is increased. There is poor inspiration. There are no signs of alveolar pulmonary edema or definite focal pulmonary consolidation. There is no significant pleural effusion or pneumothorax. In the lateral view, increased density is seen in the posterior lower lung fields which could not be localized in the AP view. Cardiac and diaphragmatic margins are preserved. IMPRESSION: There are no signs of pulmonary edema or focal pulmonary consolidation. Electronically Signed   By: Elmer Picker M.D.   On: 08/17/2021 12:03    EKG: My personal interpretation of EKG shows: NSR    Assessment/Plan Principal Problem:   DOE (dyspnea on exertion) Active Problems:   OSA on CPAP   Type 2 diabetes mellitus with hyperlipidemia (HCC)   Chronic diastolic CHF (congestive heart failure) ([place])   Difficulty demonstrating health literacy    Assessment and Plan: * DOE (dyspnea on exertion) Pt DOE is stable. I think pt's DOE is related to her not wearing supplemental O2 all the time and her continued smoking. In discussing with pt and her husband(Charles by phone). Pt had been wearing her CPAP with O2 bleed-in constantly. Pt did not know she was suppose to be wearing supplemental O2 via nasal cannula during the day. Pt and husband verify that pt does not have a  refillable portable O2 device. They have the giant green O2 cylinder in case of power outage. They use [employer]. They were able to locate [place] phone number on pt's CPAP machine. Husband instructed to call [place] tomorrow to see if they can get a refillable portable O2 tank. Pt given additional nasal cannula tubing. Pt instructed that she should unplug O2 bleed-in for CPAP and change to supplemental O2 [place] during the day. When she is ready to go to sleep, she needs to unplug supplemental O2 [place] and plug the oxygen tubing used for O2 bleed-in connected to CPAP. Pt also instructed to limit fluid intake to 60 ounces per day. Pt was drinking at least 120 ounces of water per day. Pt also eat fast-food/high salt content food. Pt and husband live in motel room with kitchenette.  Pt also instructed to stop smoking. Pt is not volume overloaded and does not require hospital admission. Discussed with EDP.  Difficulty demonstrating health literacy Pt with extremely low health literacy. She will need continued and constant education regarding 60 ounce fluid limit/day, low salt diet, medication adherence, tobacco cessation at every health care visit.  Home health nursing for her CHF would also be desirable. Defer to PCP to get this arranged and continued education.  Chronic diastolic CHF (  congestive heart failure) (HCC) Stable. Continue with lasix. Pt instructed to limit fluid intake to 60 ounces per day. Pt instructed to avoid high salt diet and maintain 2 gram sodium diet. Continue lasix 80 mg daily.  Type 2 diabetes mellitus with hyperlipidemia (HCC) Continue metformin and jardiance. Pt not following diabetic diet at home.  OSA on CPAP Pt wears her CPAP with O2. Apparently pt has been wearing her CPAP machine all day and night, except when she leaves her motel room(without any supplemental O2) or when she goes outside to smoke.   Family Communication: talked with pt's husband charles via phone   Disposition Plan: return home  Consults called: none  Admission status:  pt does not require hospital admission ,  discussed with Dr. Wyvonnia Dusky.   Kristopher Oppenheim, DO [facility] 08/17/2021, 8:16 PM

## 2021-08-17 NOTE — Discharge Instructions (Signed)
Your testing is reassuring.  No evidence of heart attack, blood clot in the lung, pneumonia.  Call your home health agency to arrange a portable oxygen tank as we discussed. Return to the ED with new or worsening symptoms

## 2021-08-17 NOTE — ED Triage Notes (Signed)
Pt arrives via [facility] with complaints of SHOB, chest pain. Recently admitted w/ CHF exacerbation and d/ced with lasix and home O2. Patient ran out of home O2, has had a 3 lb weight gain this week, and increasing SHOB. Pt was 88% on RA EMS placed on 4 L and improved to 95%. Patient reports chest pain L sided as well.

## 2021-08-17 NOTE — Assessment & Plan Note (Signed)
Pt wears her CPAP with O2. Apparently pt has been wearing her CPAP machine all day and night, except when she leaves her motel room(without any supplemental O2) or when she goes outside to smoke.

## 2021-08-17 NOTE — Assessment & Plan Note (Signed)
Pt with extremely low health literacy. She will need continued and constant education regarding 60 ounce fluid limit/day, low salt diet, medication adherence, tobacco cessation at every health care visit.  Home health nursing for her CHF would also be desirable. Defer to PCP to get this arranged and continued education.

## 2021-08-17 NOTE — Subjective & Objective (Signed)
CC: SOB HPI: 51 year old African-American female with a history of obesity BMI greater than 35, chronic hypoxic respiratory failure on home oxygen at 2 L a minute, OSA on CPAP, diastolic heart failure presents to the ER today from home due to dyspnea on exertion.  Patient was discharged recently from the hospital with a portable O2 tank to go home.  She ready has a oxygen concentrator at home.  Patient does not have a refillable portable tank to use when she is out of the house.  Patient has extremely low health literacy.  She was not aware she needed to change over to a supplemental nasal cannula during the day when she is not sleeping.  After discussion with the patient's husband Juanda Crumble via phone, it appears that the patient's been wearing her CPAP machine with O2 bleed and constantly throughout the day.  The only time she takes this office to eat, go to the bathroom, go outside to smoke.  During this time she takes off her CPAP and does not wear any supplemental oxygen at all.  She gets extremely short of breath when she takes off her CPAP which also means that she is not getting any supplemental oxygen.  When EMS arrived, she was 88% on room air.  Patient and her husband both state that when her oxygen concentrator was delivered, did not receive a refill portable O2 tank for when she is outside the home.  Patient and her husband live in a motel room with a kitchenette.  They state that the oxygen concentrator and her CPAP machine are about 3 feet from her bed.  Patient denies having a nasal cannula in her motel room that she can use during the day.  Patient also admits to drinking more than 60 ounces of water a day.  She states that she drinks 16 ounce bottles and drinks more than 6 of them a day.  She probably consumes at least 120 ounces of water a day.  They eat mostly fast food at home.  She is not hearing to his low-salt diet.  She also continues to smoke at least a pack a day of  cigarettes.  Work-up here in the ER:  BNP was normal at 8.  CMP was essentially normal.  White count was 13, hemoglobin is 13.3  Chest x-ray was negative for pulmonary edema.  [facility] hospitalist contacted for consult.

## 2021-08-17 NOTE — ED Provider Triage Note (Signed)
Emergency Medicine Provider Triage Evaluation Note  Denise [name] , a 51 y.o. female  was evaluated in triage.  Pt complains of chest pain and shortness of breath.  Having lower extremity swelling, recently admitted due to CHF exacerbation.  Since discharge she ran out of oxygen, normally on 2 L needing 4 L at PCPs office and sent to ED.  3 pound weight gain in 1 week..  Review of Systems  Per HPI  Physical Exam  BP 124/67   Pulse 91   Temp 98.8 F (37.1 C) (Oral)   Resp 18   LMP 05/29/2017 (Approximate)   SpO2 96%  Gen:   Awake, no distress   Resp:  Normal effort. Rales MSK:   Moves extremities without difficulty  Other:  Bilateral lower extremity edema  Medical Decision Making  Medically screening exam initiated at 11:36 AM.  Appropriate orders placed.  Jan Humphrey-Headen was informed that the remainder of the evaluation will be completed by another provider, this initial triage assessment does not replace that evaluation, and the importance of remaining in the ED until their evaluation is complete.     Sherrill Raring, PA-C 08/17/21 1137

## 2021-08-17 NOTE — Care Management (Signed)
ED CM spoke with patient concerning home oxygen, she reports using Cpap at night and supplement  O2 via [place]. Patient states she has 2 separate "breathing machines"  Patient states she takes off the oxygen when she goes outside to smoke, discussed the risk of smoking on oxygen. Patient verbalized understanding.  No further TOC needs identified.

## 2021-08-17 NOTE — ED Provider Notes (Signed)
Denise [name] Provider Note   CSN: [id number] Arrival date & time: 08/17/21  1123     History  Chief Complaint  Patient presents with   Shortness of Breath    [name] is a 51 y.o. female.  Patient with a history of sleep apnea, diastolic CHF, COPD on home oxygen, diabetes presenting from PCPs office with chest pain and shortness of breath.  She is a poor historian.  States she been feeling short of breath for the past several days and thinks she has gained 3 pounds since she was discharged in the hospital earlier this month.  She states compliance with her medications including her Lasix inhalers.  Has a cough productive of green and yellow mucus.  Apparently while she was at her PCPs office she was not wearing her oxygen which she is supposed to be on 24/7.  Not hypoxic on her home oxygen.  Complains of "chest pain" but gestures to her upper abdomen where she feels a tight bandlike sensation that has been constant for the past several days.  She believes she is bloated and retaining water in her legs.  She is able to lie flat at baseline and states this is unchanged.  Denies any fever.  Denies any vomiting or diarrhea.  Denies any recent medication changes.  No history of CAD or stents  The history is provided by the patient and the EMS personnel.  Shortness of Breath      Home Medications Prior to Admission medications   Medication Sig Start Date End Date Taking? Authorizing Provider  amLODipine (NORVASC) 10 MG tablet Take 10 mg by mouth daily.    [provider]  atorvastatin (LIPITOR) 20 MG tablet TAKE 1 TABLET BY MOUTH EVERY DAY 08/04/21   Chandrasekhar, Mahesh A, MD  budesonide-formoterol (SYMBICORT) 160-4.5 MCG/ACT inhaler Inhale 2 puffs into the lungs in the morning and at bedtime. 03/02/21   Chesley Mires, MD  empagliflozin (JARDIANCE) 10 MG TABS tablet Take 1 tablet (10 mg total) by mouth daily. 08/01/21   Shelly Coss, MD  furosemide (LASIX) 80 MG tablet Take 1 tablet (80 mg total) by mouth daily. 08/01/21   Shelly Coss, MD  gabapentin (NEURONTIN) 300 MG capsule Take 3 capsules (900 mg total) by mouth 3 (three) times daily. 07/23/21   Pieter Partridge, DO  losartan (COZAAR) 25 MG tablet Take 1 tablet (25 mg total) by mouth daily. 08/02/21   Shelly Coss, MD  meloxicam (MOBIC) 15 MG tablet Take 15 mg by mouth daily. Patient not taking: Reported on 08/13/2021 07/23/21   [provider]  metFORMIN (GLUCOPHAGE) 500 MG tablet Take 500 mg by mouth 2 (two) times daily. 05/25/17   [provider]  nicotine (NICODERM CQ - DOSED IN MG/24 HOURS) 14 mg/24hr patch Place 1 patch (14 mg total) onto the skin daily. 08/02/21   Shelly Coss, MD  nortriptyline (PAMELOR) 75 MG capsule Take 2 capsules (150 mg total) by mouth at bedtime. 08/13/21 08/13/22  Charlcie Cradle, MD  Polyethylene Glycol 3350 (PEG 3350) 17 GM/SCOOP POWD Take 17 g by mouth daily as needed (constipation). 11/17/20   [provider]  potassium chloride SA (KLOR-CON M) 20 MEQ tablet Take 1 tablet (20 mEq total) by mouth daily. 08/01/21   Shelly Coss, MD  VENTOLIN HFA 108 (90 Base) MCG/ACT inhaler inhale 2 PUFFS into THE lungs EVERY 6 HOURS AS NEEDED FOR WHEEZING OR SHORTNESS OF BREATH 04/15/21   Chesley Mires, MD  zolpidem (AMBIEN) 5 MG tablet Take 1 tablet (5 mg total) by mouth at bedtime as needed. 08/13/21   Charlcie Cradle, MD      Allergies    Gadolinium derivatives and Grapefruit flavor [flavoring agent]    Review of Systems   Review of Systems  Respiratory:  Positive for chest tightness and shortness of breath.    all other systems are negative except as noted in the HPI and PMH.    Physical Exam Updated Vital Signs BP (!) 149/81 (BP Location: Right Wrist)   Pulse 85   Temp 98.7 F (37.1 C)   Resp 20   LMP 05/29/2017 (Approximate)   SpO2 95%  Physical Exam Vitals and nursing note reviewed.  Constitutional:       General: She is not in acute distress.    Appearance: She is well-developed. She is obese.     Comments: Speaking in full sentence  HENT:     Head: Normocephalic and atraumatic.     Mouth/Throat:     Pharynx: No oropharyngeal exudate.  Eyes:     Conjunctiva/sclera: Conjunctivae normal.     Pupils: Pupils are equal, round, and reactive to light.  Neck:     Comments: No meningismus. Cardiovascular:     Rate and Rhythm: Normal rate and regular rhythm.     Heart sounds: Normal heart sounds. No murmur heard. Pulmonary:     Effort: Pulmonary effort is normal. No respiratory distress.     Breath sounds: Normal breath sounds.     Comments: Diminished at base Abdominal:     Palpations: Abdomen is soft.     Tenderness: There is no abdominal tenderness. There is no guarding or rebound.  Musculoskeletal:        General: No tenderness. Normal range of motion.     Cervical back: Normal range of motion and neck supple.     Right lower leg: Edema present.     Left lower leg: Edema present.     Comments: Trace pedal edema bilateral  Skin:    General: Skin is warm.  Neurological:     Mental Status: She is alert and oriented to person, place, and time.     Cranial Nerves: No cranial nerve deficit.     Motor: No abnormal muscle tone.     Coordination: Coordination normal.     Comments:  5/5 strength throughout. CN 2-12 intact.Equal grip strength.   Psychiatric:        Behavior: Behavior normal.     ED Results / Procedures / Treatments   Labs (all labs ordered are listed, but only abnormal results are displayed) Labs Reviewed  COMPREHENSIVE METABOLIC PANEL - Abnormal; Notable for the following components:      Result Value   Creatinine, Ser 1.01 (*)    All other components within normal limits  CBC WITH DIFFERENTIAL/PLATELET - Abnormal; Notable for the following components:   WBC 13.1 (*)    Lymphs Abs 4.3 (*)    Abs Immature Granulocytes 0.08 (*)    All other components within normal  limits  BRAIN NATRIURETIC PEPTIDE  D-DIMER, QUANTITATIVE  TROPONIN I (HIGH SENSITIVITY)  TROPONIN I (HIGH SENSITIVITY)    EKG EKG Interpretation  Date/Time:  Monday August 17 2021 12:02:43 EDT Ventricular Rate:  86 PR Interval:  202 QRS Duration: 76 QT Interval:  380 QTC Calculation: 454 R Axis:   -9 Text Interpretation: Unusual P axis, possible ectopic atrial rhythm Low voltage QRS Nonspecific T wave abnormality Abnormal  ECG When compared with ECG of 28-Jul-2021 15:17, PREVIOUS ECG IS PRESENT No significant change was found Confirmed by Ezequiel Essex [phone]) on 08/17/2021 6:55:59 PM  Radiology DG Chest 2 View  Result Date: 08/17/2021 CLINICAL DATA:  Shortness of breath EXAM: CHEST - 2 VIEW COMPARISON:  07/28/2021 FINDINGS: Transverse diameter heart is increased. There is poor inspiration. There are no signs of alveolar pulmonary edema or definite focal pulmonary consolidation. There is no significant pleural effusion or pneumothorax. In the lateral view, increased density is seen in the posterior lower lung fields which could not be localized in the AP view. Cardiac and diaphragmatic margins are preserved. IMPRESSION: There are no signs of pulmonary edema or focal pulmonary consolidation. Electronically Signed   By: Elmer Picker M.D.   On: 08/17/2021 12:03    Procedures Procedures    Medications Ordered in ED Medications  aspirin chewable tablet 324 mg (has no administration in time range)  furosemide (LASIX) injection 80 mg (has no administration in time range)    ED Course/ Medical Decision Making/ A&P                           Medical Decision Making Amount and/or Complexity of Data Reviewed Labs: ordered. Decision-making details documented in ED Course. Radiology: ordered and independent interpretation performed. Decision-making details documented in ED Course. ECG/medicine tests: ordered and independent interpretation performed. Decision-making details documented  in ED Course.  Risk OTC drugs. Prescription drug management.   Patient with chronic respiratory failure on home oxygen presenting from PCPs office with shortness of breath and hypoxia as well as chest pain.  Symptoms ongoing for the past 3 days.  On arrival she was placed back on her home oxygen with good improvement of her O2 saturations.  Lungs are clear without wheezing.  She is speaking in full sentences and has no respiratory distress.  EKG is sinus rhythm without acute ST changes and no evidence of ischemia.  Chest x-ray appears clear without evidence of pleural effusions or edema.  Results reviewed and interpreted by me.  Unclear whether her symptoms are related to not having her oxygen.  Will evaluate for ACS with serial troponins and rule out pulmonary embolism  Troponin is negative, D-dimer is negative.  Low suspicion for pulmonary embolism.  On attempted ambulation patient becomes quite dyspneic and desaturates to 92% on room air.  It appears patient does not have a portable O2 tank.  Case manager will be involved.  She apparently has been using the bleeding from her CPAP for continuous oxygen during the day but does not have a portable tank.  Discussed with Dr. Bridgett Larsson who is seen patient at bedside.  He does not feel she has been into the hospital.  We will involve case manager and social worker to try to ensure that she has a portable O2 tank  Per Dr. Bridgett Larsson. Marland Kitchen "I think pt's DOE is related to her not wearing supplemental O2 all the time and her continued smoking. In discussing with pt and her husband(Charles by phone). Pt had been wearing her CPAP with O2 bleed-in constantly. Pt did not know she was suppose to be wearing supplemental O2 via nasal cannula during the day. Pt and husband verify that pt does not have a refillable portable O2 device."  Patient has been seen by case manager and she will help arrange portable O2 tank for patient.  Her husband to call home health care agency in  the morning.  Patient appears to be at her stable chronic baseline level of dyspnea.  No evidence of significant CHF exacerbation, pneumonia, pulmonary embolism, ACS.  Discussed medication adherence, oxygen adherence, low salt diet. Return to the ED if you develop new or worsening symptoms.         Final Clinical Impression(s) / ED Diagnoses Final diagnoses:  Dyspnea, unspecified type    Rx / DC Orders ED Discharge Orders     None         Kowen Kluth, Annie Main, MD 08/17/21 2348

## 2021-08-17 NOTE — ED Notes (Signed)
This NT witnessed the pt walk around the room with a pulse ox. Stat ranged from 90-96 during and after walking approx 25f. Pt stated that she began to feel light headed and proceeded to sit back in the bed. O2 never dropped below 90.

## 2021-08-17 NOTE — Assessment & Plan Note (Signed)
Pt DOE is stable. I think pt's DOE is related to her not wearing supplemental O2 all the time and her continued smoking. In discussing with pt and her husband(Charles by phone). Pt had been wearing her CPAP with O2 bleed-in constantly. Pt did not know she was suppose to be wearing supplemental O2 via nasal cannula during the day. Pt and husband verify that pt does not have a refillable portable O2 device. They have the giant green O2 cylinder in case of power outage. They use [employer]. They were able to locate [place] phone number on pt's CPAP machine. Husband instructed to call [place] tomorrow to see if they can get a refillable portable O2 tank. Pt given additional nasal cannula tubing. Pt instructed that she should unplug O2 bleed-in for CPAP and change to supplemental O2 [place] during the day. When she is ready to go to sleep, she needs to unplug supplemental O2 [place] and plug the oxygen tubing used for O2 bleed-in connected to CPAP. Pt also instructed to limit fluid intake to 60 ounces per day. Pt was drinking at least 120 ounces of water per day. Pt also eat fast-food/high salt content food. Pt and husband live in motel room with kitchenette.  Pt also instructed to stop smoking. Pt is not volume overloaded and does not require hospital admission. Discussed with EDP.

## 2021-08-17 NOTE — Assessment & Plan Note (Signed)
Stable. Continue with lasix. Pt instructed to limit fluid intake to 60 ounces per day. Pt instructed to avoid high salt diet and maintain 2 gram sodium diet. Continue lasix 80 mg daily.

## 2021-08-17 NOTE — ED Notes (Signed)
Pt provided discharge instructions and prescription information. Pt was given the opportunity to ask questions and questions were answered.   

## 2021-08-20 ENCOUNTER — Ambulatory Visit
Admission: RE | Admit: 2021-08-20 | Discharge: 2021-08-20 | Disposition: A | Discharge status: Home / Self Care | Payer: Medicaid Other | Source: Ambulatory Visit | Attending: Neurology | Admitting: Neurology

## 2021-08-20 DIAGNOSIS — F0280 Dementia in other diseases classified elsewhere without behavioral disturbance: Secondary | ICD-10-CM

## 2021-08-20 DIAGNOSIS — F445 Conversion disorder with seizures or convulsions: Secondary | ICD-10-CM

## 2021-08-21 ENCOUNTER — Telehealth: Payer: Self-pay

## 2021-08-21 NOTE — Telephone Encounter (Signed)
Pt called an informed brain MRI did not show any changes from brain scan done in 2021. No tumor, new stroke, or bleed seen.

## 2021-08-21 NOTE — Telephone Encounter (Signed)
-----   Message from Cameron Sprang, MD sent at 08/21/2021  1:03 PM EDT ----- Pls let patient know brain MRI did not show any changes from brain scan done in 2021. No tumor, new stroke, or bleed seen.

## 2021-08-26 ENCOUNTER — Encounter: Payer: Self-pay | Admitting: Podiatry

## 2021-08-26 ENCOUNTER — Ambulatory Visit (INDEPENDENT_AMBULATORY_CARE_PROVIDER_SITE_OTHER): Payer: Medicaid Other | Admitting: Podiatry

## 2021-08-26 DIAGNOSIS — I739 Peripheral vascular disease, unspecified: Secondary | ICD-10-CM

## 2021-08-26 DIAGNOSIS — B351 Tinea unguium: Secondary | ICD-10-CM

## 2021-08-26 DIAGNOSIS — M79675 Pain in left toe(s): Secondary | ICD-10-CM | POA: Diagnosis not present

## 2021-08-26 DIAGNOSIS — E1169 Type 2 diabetes mellitus with other specified complication: Secondary | ICD-10-CM

## 2021-08-26 DIAGNOSIS — M79674 Pain in right toe(s): Secondary | ICD-10-CM

## 2021-08-26 NOTE — Progress Notes (Signed)
This patient presents to the office with chief complaint of long thick nails     This patient says there are long thick painful nails.  These nails are painful walking and wearing shoes.   Patient is unable to  self treat her own nails . Patient has been diagnosed with diabetes and claudication. This patient presents  to the office today for treatment of the  long nails.  General Appearance  Alert, conversant and in no acute stress.  Vascular  Dorsalis pedis and posterior tibial  pulses are weakly  palpable  bilaterally.  Capillary return is within normal limits  bilaterally. Temperature is within normal limits  bilaterally.  Neurologic  Senn-Weinstein monofilament wire test within normal limits/diminished  B/L.  Muscle power within normal limits bilaterally.  Nails Thick disfigured discolored nails with subungual debris  from hallux to fifth toes bilaterally. No evidence of bacterial infection or drainage bilaterally.  Orthopedic  No limitations of motion of motion feet .  No crepitus or effusions noted.  No bony pathology or digital deformities noted.  Skin  Dry skin with no porokeratosis noted bilaterally.  No signs of infections or ulcers noted.     Onychomycosis    Debride nails x 10.    RTC 3 months.     Joya Willmott DPM  

## 2021-08-31 DIAGNOSIS — J439 Emphysema, unspecified: Secondary | ICD-10-CM

## 2021-08-31 HISTORY — DX: Emphysema, unspecified: J43.9

## 2021-09-03 ENCOUNTER — Telehealth (HOSPITAL_BASED_OUTPATIENT_CLINIC_OR_DEPARTMENT_OTHER): Payer: Medicaid Other | Admitting: Psychiatry

## 2021-09-03 ENCOUNTER — Telehealth: Payer: Self-pay | Admitting: Neurology

## 2021-09-03 DIAGNOSIS — G4701 Insomnia due to medical condition: Secondary | ICD-10-CM

## 2021-09-03 DIAGNOSIS — F4312 Post-traumatic stress disorder, chronic: Secondary | ICD-10-CM

## 2021-09-03 DIAGNOSIS — F333 Major depressive disorder, recurrent, severe with psychotic symptoms: Secondary | ICD-10-CM | POA: Diagnosis not present

## 2021-09-03 DIAGNOSIS — F411 Generalized anxiety disorder: Secondary | ICD-10-CM

## 2021-09-03 MED ORDER — DIVALPROEX SODIUM ER 250 MG PO TB24: 250.0000 mg | ORAL_TABLET | Freq: Every day | ORAL | 0 refills | Status: DC

## 2021-09-03 NOTE — Telephone Encounter (Signed)
Can she have a driver bring her for a migraine cocktail?

## 2021-09-03 NOTE — Telephone Encounter (Signed)
How frequent or the headaches (on average, how many days a week/month are they occurring)?  Every day for the last week How long do the headaches last?  Patient will wake up with it and it will go away after while.  Verify what preventative medication and dose you are taking (e.g. topiramate, propranolol, amitriptyline, Emgality, etc)  Gabapentin as prescribed.  Verify which rescue medication you are taking (triptan, Advil, Excedrin, Aleve, Ubrelvy, etc)  NONE How often are you taking pain relievers/analgesics/rescue mediction?  Everyday since the headache started.

## 2021-09-03 NOTE — Telephone Encounter (Signed)
LMOVM for patient to call the office back.

## 2021-09-03 NOTE — Progress Notes (Signed)
Virtual Visit via Phone Note  I connected with Denise [name] on 09/03/21 at 10:00 AM EDT by  phone. I verified that I am speaking with the correct person using two identifiers.  Location: Patient: home Provider: office   I discussed the limitations of evaluation and management by telemedicine and the availability of in person appointments. The patient expressed understanding and agreed to proceed.  History of Present Illness: Denise [name] shares that her mood remains low. She often feels she loses her thoughts when doing things. She has been having migraines every morning. She has a follow up appointment with her neurologist in December. I encouraged her to call her neurologist to discuss migraine treatment. Her appetite is increased at night after she takes her meds. During the day her appetite is poor. With Ambien she is getting 8 hrs/night. Her energy is low and she gets tired doing small things. She often tries to nap during the day but states racing thoughts keep her up. Taylon thinks she is having bad dreams but can't remember them. Denya is endorsing intrusive memories that occur most days. She feels "at war against myself". She is depressed and is often mad herself. She denies SI/HI and passive thoughts of death.   Her husband came on the phone to discuss his concerns. He states she is getting worse. Her "mind is stuck in a different place". She is very overweight. Her PCP wants her to quit smoking but she is not. Sherral is sleeping all day and night. She is only up for 5-6 hrs in 24 hrs. He believes she is depressed. They are moving in October.    Observations/Objective: Psychiatric Specialty Exam:  General Appearance: unable to assess  Eye Contact:  unable to assess  Speech:  Clear and Coherent and pushed  Volume:  Normal  Mood:  Depressed  Affect:  Congruent  Thought Process:  Coherent and Descriptions of Associations: Circumstantial  Orientation:  Full (Time, Place, and  Person)  Thought Content:  Logical  Suicidal Thoughts:  No  Homicidal Thoughts:  No  Memory:  Immediate;   Poor  Judgement:  Poor  Insight:  Shallow  Psychomotor Activity: unable to assess  Concentration:  Concentration: Poor  Recall:  Poor  Fund of Knowledge:  Fair  Language:  Fair  Akathisia:  unable to assess  Handed:  unable to assess  AIMS (if indicated):     Assets:  Desire for Improvement Financial Resources/Insurance Housing Intimacy Social Support Vocational/Educational  ADL's:  unable to assess  Cognition:  WNL  Sleep:         Assessment and Plan:     09/03/2021   10:09 AM 08/13/2021   10:42 AM 06/25/2021    1:45 PM 05/14/2021    8:44 AM 04/23/2021    9:09 AM  Depression screen PHQ 2/9  Decreased Interest '2 3 2 '$ 0 3  Down, Depressed, Hopeless '3 3 3 3 3  '$ PHQ - 2 Score '5 6 5 3 6  '$ Altered sleeping 0 '3 2 3 2  '$ Tired, decreased energy '3 3 3 3 3  '$ Change in appetite '3 2 2 3 3  '$ Feeling bad or failure about yourself  '2 3 3 2 3  '$ Trouble concentrating '3 3 3 3 3  '$ Moving slowly or fidgety/restless 3 3 0 0 3  Suicidal thoughts 0 0 0 0 0  PHQ-9 Score '19 23 18 17 23  '$ Difficult doing work/chores Extremely dIfficult Extremely dIfficult Extremely dIfficult Extremely dIfficult Extremely dIfficult  Flowsheet Row Video Visit from 09/03/2021 in [facility] ED from 08/17/2021 in [place] Video Visit from 08/13/2021 in [place] No Risk No Risk No Risk          Status of current problems: ongoing depression and anxiety with declining memory   I Reviewed recent neurology visit notes (07/23/21) 1  Scalp neuralgia 2  Major neurocognitive disorder, secondary to cerebrovascular disease as well as possibly component of anxiety and pharmacologic effect.  Based on her age and lack of family history, an underlying neurodegenerative disease such  as early-onset Alzheimer's is unlikely   Increase gabapentin to '900mg'$  three times daily Workup memory deficits MRI of brain without contrast Check B12 and RPR Follow up 6 months  Her neurologist is working her up for memory issues.   Meds: d/c Ambien due to excessive fatigue.   Start trial of Depakote ER '250mg'$  po qD as mood has not responded with trials with [name], Abilify, Lithium, Buspar, Zoloft  Continue Nortriptyline '150mg'$  po qD- may consider d/c as she is not really reporting much benefit lately.   1. Severe episode of recurrent major depressive disorder, with psychotic features ([place])  2. GAD (generalized anxiety disorder)  3. Chronic post-traumatic stress disorder (PTSD)  4. Insomnia due to medical condition     Labs: reviewed labs done 08/17/21- she is working with her PCP for management    Therapy: brief supportive therapy provided. Discussed psychosocial stressors in detail.     Collaboration of Care: Other neurology  Patient/Guardian was advised Release of Information must be obtained prior to any record release in order to collaborate their care with an outside provider. Patient/Guardian was advised if they have not already done so to contact the registration department to sign all necessary forms in order for [place] to release information regarding their care.   Consent: Patient/Guardian gives verbal consent for treatment and assignment of benefits for services provided during this visit. Patient/Guardian expressed understanding and agreed to proceed.      Follow Up Instructions: Follow up in 1 week or sooner if needed    I discussed the assessment and treatment plan with the patient. The patient was provided an opportunity to ask questions and all were answered. The patient agreed with the plan and demonstrated an understanding of the instructions.   The patient was advised to call back or seek an in-person evaluation if the symptoms worsen or if the condition  fails to improve as anticipated.  I provided 22 minutes of non-face-to-face time during this encounter.   Charlcie Cradle, MD

## 2021-09-03 NOTE — Telephone Encounter (Signed)
Patient called and said she has had a headache for about the last week and a half.  She is requesting Dr. Georgie Chard help with this.

## 2021-09-04 NOTE — Telephone Encounter (Signed)
Per patient her migraines has broken. If she needs to she will call us to ask for a prednisone taper. She has to use Medicaid Transportation so she can not just schedule same day.

## 2021-09-10 ENCOUNTER — Telehealth (HOSPITAL_COMMUNITY): Payer: Medicaid Other | Admitting: Psychiatry

## 2021-09-10 ENCOUNTER — Telehealth (HOSPITAL_COMMUNITY): Payer: Self-pay | Admitting: Psychiatry

## 2021-09-10 ENCOUNTER — Other Ambulatory Visit: Payer: Self-pay

## 2021-09-10 NOTE — Telephone Encounter (Signed)
Patient was not present on video platform used through [employer]. I called the patient at our scheduled appointment time. There was no answer. I left a voice message for patient to call the clinic back at their convinence. There was no return phone call during out scheduled visit time. The patient was a no show for their scheduled appointment.

## 2021-09-15 ENCOUNTER — Other Ambulatory Visit (HOSPITAL_COMMUNITY): Payer: Self-pay | Admitting: Psychiatry

## 2021-09-15 DIAGNOSIS — G4701 Insomnia due to medical condition: Secondary | ICD-10-CM

## 2021-09-17 ENCOUNTER — Telehealth (HOSPITAL_BASED_OUTPATIENT_CLINIC_OR_DEPARTMENT_OTHER): Payer: Medicaid Other | Admitting: Psychiatry

## 2021-09-17 DIAGNOSIS — F333 Major depressive disorder, recurrent, severe with psychotic symptoms: Secondary | ICD-10-CM

## 2021-09-17 MED ORDER — DIVALPROEX SODIUM ER 250 MG PO TB24: 250.0000 mg | ORAL_TABLET | Freq: Every day | ORAL | 0 refills | Status: DC

## 2021-09-17 NOTE — Progress Notes (Signed)
Virtual Visit via phone Note  I connected with Denise [name] on 09/17/21 at  9:30 AM EDT by phone. She is unable to connect by   a video enabled telemedicine application because she does not have Internet or a smart phone. I verified that I am speaking with the correct person using two identifiers.  Location: Patient: home Provider: office   I discussed the limitations of evaluation and management by telemedicine and the availability of in person appointments. The patient expressed understanding and agreed to proceed.  History of Present Illness: "I am doing better". Arthella is feeling better. She was not able to move but has cleaned up her current house. They got rid of a lot of things and she feels more comfortable. Her son is living with her and it is going well. They are still looking for another place to move but she is not going to stress about it. She has been watching movies, instead of just cartoons. Her focus is a little better. She has only felt a little depressed every other day in the last 2 weeks. On those days she feels low but not like before. She denies SI/HI. The Depakote is helping and she denies SE. Her energy is still very low. She is only getting about 3 hrs of sleep. She is up during the daytime. Her appetite is poor. Overall the world is brighter and better. She is having a small decrease in negative self talk but psychomotor retardation.    Observations/Objective: Psychiatric Specialty Exam:  General Appearance: unable to assess  Eye Contact:  unable to assess  Speech:  Clear and Coherent and Normal Rate  Volume:  Normal  Mood:  Depressed  Affect:  Full Range and brighter overall and laughed several times during our session  Thought Process:  Coherent, Linear, and Descriptions of Associations: Intact  Orientation:  Full (Time, Place, and Person)  Thought Content:  Logical  Suicidal Thoughts:  No  Homicidal Thoughts:  No  Memory:  Immediate;   Good   Judgement:  Fair  Insight:  Fair  Psychomotor Activity: unable to assess  Concentration:  Concentration: Fair  Recall:  [employer] of Knowledge:  Fair  Language:  Fair  Akathisia:  unable to assess  Handed:  unable to assess  AIMS (if indicated):     Assets:  Communication Skills Desire for Improvement Financial Resources/Insurance Housing Intimacy Resilience Social Support Talents/Skills Vocational/Educational  ADL's:  unable to assess  Cognition:  WNL  Sleep:         Assessment and Plan:     09/17/2021   10:23 AM 09/03/2021   10:09 AM 08/13/2021   10:42 AM 06/25/2021    1:45 PM 05/14/2021    8:44 AM  Depression screen PHQ 2/9  Decreased Interest '1 2 3 2 '$ 0  Down, Depressed, Hopeless '2 3 3 3 3  '$ PHQ - 2 Score '3 5 6 5 3  '$ Altered sleeping 3 0 '3 2 3  '$ Tired, decreased energy '3 3 3 3 3  '$ Change in appetite '3 3 2 2 3  '$ Feeling bad or failure about yourself  '1 2 3 3 2  '$ Trouble concentrating '1 3 3 3 3  '$ Moving slowly or fidgety/restless '3 3 3 '$ 0 0  Suicidal thoughts 0 0 0 0 0  PHQ-9 Score '17 19 23 18 17  '$ Difficult doing work/chores Very difficult Extremely dIfficult Extremely dIfficult Extremely dIfficult Extremely dIfficult    Flowsheet Row [facility] Video Visit from 09/03/2021 in [facility] ED from 08/17/2021 in [place] No Risk No Risk No Risk         Pt is aware that these meds carry a teratogenic risk. Pt will discuss plan of action if she does or plans to become pregnant in the future.  Status of current problems: slow improvement in depression symptoms. She is tolerating the Depakote and is reporting benefit and wants to continue this medication. I will continue Nortriptyline '150mg'$  po qHS.   Meds:  1. Severe episode of recurrent major depressive disorder, with psychotic features ([place]) -  divalproex (DEPAKOTE ER) 250 MG 24 hr tablet; Take 1 tablet (250 mg total) by mouth daily.  Dispense: 90 tablet; Refill: 0    Labs: none    Therapy: brief supportive therapy provided.    Collaboration of Care: Other none  Patient/Guardian was advised Release of Information must be obtained prior to any record release in order to collaborate their care with an outside provider. Patient/Guardian was advised if they have not already done so to contact the registration department to sign all necessary forms in order for [place] to release information regarding their care.   Consent: Patient/Guardian gives verbal consent for treatment and assignment of benefits for services provided during this visit. Patient/Guardian expressed understanding and agreed to proceed.     Follow Up Instructions: Follow up in 2-3 months or sooner if needed    I discussed the assessment and treatment plan with the patient. The patient was provided an opportunity to ask questions and all were answered. The patient agreed with the plan and demonstrated an understanding of the instructions.   The patient was advised to call back or seek an in-person evaluation if the symptoms worsen or if the condition fails to improve as anticipated.  I provided 9 minutes of non-face-to-face time during this encounter.   Charlcie Cradle, MD

## 2021-09-21 ENCOUNTER — Encounter: Payer: Self-pay | Admitting: Pulmonary Disease

## 2021-09-21 ENCOUNTER — Ambulatory Visit (INDEPENDENT_AMBULATORY_CARE_PROVIDER_SITE_OTHER): Payer: Medicaid Other | Admitting: Pulmonary Disease

## 2021-09-21 ENCOUNTER — Telehealth: Payer: Self-pay | Admitting: Pulmonary Disease

## 2021-09-21 VITALS — BP 120/80 | HR 92 | Ht 65.0 in | Wt 339.4 lb

## 2021-09-21 DIAGNOSIS — R0602 Shortness of breath: Secondary | ICD-10-CM

## 2021-09-21 DIAGNOSIS — J411 Mucopurulent chronic bronchitis: Secondary | ICD-10-CM

## 2021-09-21 DIAGNOSIS — Z720 Tobacco use: Secondary | ICD-10-CM

## 2021-09-21 DIAGNOSIS — G4733 Obstructive sleep apnea (adult) (pediatric): Secondary | ICD-10-CM | POA: Diagnosis not present

## 2021-09-21 DIAGNOSIS — E662 Morbid (severe) obesity with alveolar hypoventilation: Secondary | ICD-10-CM | POA: Diagnosis not present

## 2021-09-21 DIAGNOSIS — J9611 Chronic respiratory failure with hypoxia: Secondary | ICD-10-CM

## 2021-09-21 MED ORDER — AEROCHAMBER MV MISC: 0 refills | Status: DC

## 2021-09-21 NOTE — Patient Instructions (Addendum)
Will have [facility] arrange for new CPAP mask and supplies.  Will have [facility] set you up to use 3 liters oxygen during the day.  You should continue to use 2 liters oxygen at night with CPAP.  Use your spacer device with your inhalers.  Follow up in 6 months.

## 2021-09-21 NOTE — Telephone Encounter (Signed)
Entered in error

## 2021-09-21 NOTE — Progress Notes (Signed)
[facility]  Chief Complaint  Patient presents with   Follow-up    Follow-up: Needs a O2 concentrator     Constitutional:  BP 120/80 (BP Location: Right Arm)   Pulse 92   Ht '5\' 5"'$  (1.651 m)   Wt (!) 339 lb 6.4 oz (154 kg)   LMP 05/29/2017 (Approximate)   SpO2 (!) 88%   BMI 56.48 kg/m   Past Medical History:  Hemorrhagic CVA, Psychogenic seizures, PSTD, Depression, Anxiety, HTN, Syphilis, HLD, Overactive bladder, COVID 16 March 2019  Past Surgical History:  She  has a past surgical history that includes Cesarean section.  Brief Summary:  Denise [name] is a 51 y.o. female smoker with dyspnea, cough, and obstructive sleep apnea with obesity hypoventilation syndrome.      Subjective:   She was in hospital a couple times since I last saw her.  She was told that she needed to wear oxygen 24/7, but was only sent home with a few tanks that weren't refilled or replaced.  She has an oxygen concentrator at home.  She hasn't been able to use her CPAP for few months.  She had a part break on her tubing.  She is smoking 2 to 3 cigarettes per day.  She has cough and chest congestion.  Brings up clear sputum.  Uses symbicort and this helps.  Recent Echo was normal.  CT chest from July showed minimal atelectasis.  SpO2 on room air was down to 83% after walking.  She was recovered on 3 liters oxygen with SpO2 staying above 90%.  Physical Exam:   Appearance - well kempt   ENMT - no sinus tenderness, no oral exudate, no LAN, Mallampati 3 airway, no stridor  Respiratory - equal breath sounds bilaterally, no wheezing or rales  CV - s1s2 regular rate and rhythm, no murmurs  Ext - no clubbing, no edema  Skin - no rashes  Psych - normal mood and affect     Pulmonary testing:  PFT 04/17/20 >> FEV1 1.80 (75%), FEV1% 88, TLC 3.54 (68%), DLCO 58%  Chest Imaging:  CT angio chest 07/29/21 >> atherosclerosis, minimal  atelectasis  Sleep Tests:  HST 10/07/17 >> AHI 25, SpO2 low 69% PSG 08/23/19 >> AHI 10.6, SpO2 low 80%; CPAP 17 cm H2O with 2 liters O2 CPAP 01/24/21 to 02/22/21 >> used on 30 of 30 nights with average 12 hrs 11 min.  Average AHI 0.8 with CPAP 17 cm H2O  Cardiac Tests:  Echo 07/17/21 >> EF 60 to 65%  Social History:  She  reports that she has been smoking cigarettes. She has been smoking an average of .4 packs per day. She has never used smokeless tobacco. She reports that she does not currently use alcohol after a past usage of about 2.0 standard drinks of alcohol per week. She reports that she does not currently use drugs after having used the following drugs: "Crack" cocaine.  Family History:  Her family history includes Alcohol abuse in her mother; Anxiety disorder in her father and mother; Depression in her mother; Drug abuse in her father and mother; Heart disease in her maternal grandmother; Stroke in her mother.     Assessment/Plan:   Obstructive sleep apnea. - she is compliant with CPAP and reports benefit from therapy - uses [facility] for her DME - current CPAP set up in October 2021 - continue CPAP 17 cm H2O - will arrange for replacement supplies   Chronic hypoxic respiratory  failure from obesity hypoventilation syndrome. - will need to set up daytime oxygen - she is to use 3 liters oxygen during the day and 2 liters at night with CPAP - will see if her DME will get her a POC  Chronic bronchitis with tobacco abuse. - continue symbicort 160 two puffs bid - prn ventolin - will arrange for a spacer device  Tobacco abuse. - she will gradually try to quit    Time Spent Involved in Patient Care on Day of Examination:  37 minutes  Follow up:   Patient Instructions  Will have [facility] arrange for new CPAP mask and supplies.  Will have [facility] set you up to use 3 liters oxygen during the day.  You should continue to use 2 liters oxygen at night with CPAP.  Use your spacer  device with your inhalers.  Follow up in 6 months.  Medication List:   Allergies as of 09/21/2021       Reactions   Gadolinium Derivatives Nausea And Vomiting   Nausea and vomiting was followed by epileptic seizure episode that lasted approximately 5 minutes; pt was unable to verbally communicate during that time; she then came to and was able to speak; was evaluated by Rad and RN; kms    Grapefruit Flavor [flavoring Agent] Other (See Comments)   Drug interaction        Medication List        Accurate as of September 21, 2021 10:28 AM. If you have any questions, ask your nurse or doctor.          AeroChamber MV inhaler Use as instructed Started by: Chesley Mires, MD   amLODipine 10 MG tablet Commonly known as: NORVASC Take 10 mg by mouth daily.   atorvastatin 20 MG tablet Commonly known as: LIPITOR TAKE 1 TABLET BY MOUTH EVERY DAY   budesonide-formoterol 160-4.5 MCG/ACT inhaler Commonly known as: Symbicort Inhale 2 puffs into the lungs in the morning and at bedtime.   divalproex 250 MG 24 hr tablet Commonly known as: Depakote ER Take 1 tablet (250 mg total) by mouth daily.   furosemide 80 MG tablet Commonly known as: LASIX Take 1 tablet (80 mg total) by mouth daily.   gabapentin 300 MG capsule Commonly known as: NEURONTIN Take 3 capsules (900 mg total) by mouth 3 (three) times daily.   Jardiance 10 MG Tabs tablet Generic drug: empagliflozin Take 1 tablet (10 mg total) by mouth daily.   losartan 25 MG tablet Commonly known as: COZAAR Take 1 tablet (25 mg total) by mouth daily.   meloxicam 15 MG tablet Commonly known as: MOBIC Take 15 mg by mouth daily.   metFORMIN 500 MG tablet Commonly known as: GLUCOPHAGE Take 500 mg by mouth 2 (two) times daily.   nicotine 14 mg/24hr patch Commonly known as: NICODERM CQ - dosed in mg/24 hours Place 1 patch (14 mg total) onto the skin daily.   nortriptyline 75 MG capsule Commonly known as: Pamelor Take 2  capsules (150 mg total) by mouth at bedtime.   PEG 3350 17 GM/SCOOP Powd Take 17 g by mouth daily as needed (constipation).   potassium chloride SA 20 MEQ tablet Commonly known as: KLOR-CON M Take 1 tablet (20 mEq total) by mouth daily.   Ventolin HFA 108 (90 Base) MCG/ACT inhaler Generic drug: albuterol inhale 2 PUFFS into THE lungs EVERY 6 HOURS AS NEEDED FOR WHEEZING OR SHORTNESS OF BREATH        Signature:  Chesley Mires,  MD [place] Pager - [phone] 09/21/2021, 10:28 AM

## 2021-09-22 ENCOUNTER — Other Ambulatory Visit: Payer: Self-pay

## 2021-09-22 DIAGNOSIS — R0602 Shortness of breath: Secondary | ICD-10-CM

## 2021-09-22 NOTE — Progress Notes (Signed)
Per [place]: Riesa Pope, [place]; [place], Leory Plowman; [place], [facility] Hey guys! Is this order for a POC? If so, I need it to be written for one.Marland Kitchen POC at a setting 3L or Pulse dose of 3L. Thanks!!  Corrected order placed. Nothing further needed.

## 2021-09-25 ENCOUNTER — Other Ambulatory Visit (HOSPITAL_COMMUNITY): Payer: Self-pay | Admitting: Psychiatry

## 2021-09-25 DIAGNOSIS — G4701 Insomnia due to medical condition: Secondary | ICD-10-CM

## 2021-10-08 ENCOUNTER — Telehealth: Payer: Self-pay | Admitting: Pulmonary Disease

## 2021-10-12 NOTE — Telephone Encounter (Signed)
Order for new cpap machine was sent to [facility] in 08/2021. Order was sent to [facility]. Advised patient of this. Told her to contact [facility] to make sure they have order. Nothing further needed

## 2021-10-14 ENCOUNTER — Telehealth: Payer: Self-pay | Admitting: Pulmonary Disease

## 2021-10-14 NOTE — Telephone Encounter (Signed)
Called and spoke to patient and advised her that the order for cpap supplies was already placed at the end of August. I advised her to call [facility] to make sure that she receives her supplies. Nothing further needed

## 2021-10-15 ENCOUNTER — Telehealth: Payer: Self-pay | Admitting: Pulmonary Disease

## 2021-10-15 DIAGNOSIS — G4733 Obstructive sleep apnea (adult) (pediatric): Secondary | ICD-10-CM

## 2021-10-15 DIAGNOSIS — R0602 Shortness of breath: Secondary | ICD-10-CM

## 2021-10-15 NOTE — Telephone Encounter (Signed)
Vallarie Mare, do you have a CMN for this patient?

## 2021-10-15 NOTE — Telephone Encounter (Signed)
No I do not. 

## 2021-10-15 NOTE — Telephone Encounter (Signed)
I called and spoke with Brad at [facility] and then  just placed a new order for CPAP supplies and POC order.  I have let the patient know that we will get this worked on so she can get her supplies. Nothing further needed.

## 2021-10-15 NOTE — Telephone Encounter (Signed)
(  See previous encounter) patient states she spoke with [facility] and they are still saying Dr. Halford Chessman needs to sign off on the order for supplies. Can someone see what the disconnect is? Please call patient back with an update.

## 2021-10-16 NOTE — Telephone Encounter (Signed)
Patient called back and states [facility] is still waiting on our office to refill CPAP order, POC would be there tomorrow. Spoke with Leroy Sea regarding CPAP supplies and order is being handled. Informed patient of this information and verbalized understanding.

## 2021-11-19 ENCOUNTER — Encounter (HOSPITAL_COMMUNITY): Payer: Self-pay

## 2021-11-19 ENCOUNTER — Telehealth (HOSPITAL_COMMUNITY): Payer: Self-pay | Admitting: Psychiatry

## 2021-11-19 ENCOUNTER — Telehealth (HOSPITAL_COMMUNITY): Payer: Medicaid Other | Admitting: Psychiatry

## 2021-11-19 NOTE — Telephone Encounter (Signed)
Patient was not present on video platform used through mychart. I called the patient at our scheduled appointment time. There was no answer. I left a voice message for patient to call the clinic back at their convinence. There was no return phone call during out scheduled visit time. I was not able to speak with the patient today, as they were a no show for their scheduled appointment.   

## 2021-11-23 ENCOUNTER — Other Ambulatory Visit: Payer: Self-pay | Admitting: Internal Medicine

## 2021-11-26 ENCOUNTER — Telehealth (HOSPITAL_BASED_OUTPATIENT_CLINIC_OR_DEPARTMENT_OTHER): Payer: Medicaid Other | Admitting: Psychiatry

## 2021-11-26 DIAGNOSIS — G4701 Insomnia due to medical condition: Secondary | ICD-10-CM | POA: Diagnosis not present

## 2021-11-26 DIAGNOSIS — F333 Major depressive disorder, recurrent, severe with psychotic symptoms: Secondary | ICD-10-CM | POA: Diagnosis not present

## 2021-11-26 DIAGNOSIS — F411 Generalized anxiety disorder: Secondary | ICD-10-CM | POA: Diagnosis not present

## 2021-11-26 DIAGNOSIS — F4312 Post-traumatic stress disorder, chronic: Secondary | ICD-10-CM | POA: Diagnosis not present

## 2021-11-26 MED ORDER — DIVALPROEX SODIUM ER 500 MG PO TB24: 500.0000 mg | ORAL_TABLET | Freq: Every day | ORAL | 0 refills | Status: DC

## 2021-11-26 MED ORDER — NORTRIPTYLINE HCL 75 MG PO CAPS: 150.0000 mg | ORAL_CAPSULE | Freq: Every day | ORAL | 0 refills | Status: DC

## 2021-11-26 NOTE — Progress Notes (Signed)
Virtual Visit via Telephone Note  I connected with Denise [name] on 11/26/21 at  9:15 AM EDT by telephone and verified that I am speaking with the correct person using two identifiers.  Location: Patient: home Provider: office   I discussed the limitations, risks, security and privacy concerns of performing an evaluation and management service by telephone and the availability of in person appointments. I also discussed with the patient that there may be a patient responsible charge related to this service. The patient expressed understanding and agreed to proceed.   History of Present Illness: Denise [name] shares that things have been going better lately. She has been feeling better about herself lately. Her son and husband have been sick and she has been taking good care of them. Denise [name] continues to experience negative self thoughts that come all of the sudden but they pass quickly. This is a daily struggle. Her depression comes on each night before going to sleep. It makes go quiet and isolate. She is not sleeping well and only getting about 5 hrs/night. Her energy is always low. Her motivation and anhedonia are a little improved. She has been doing some memory games but states her memory is very poor. She often has difficulty getting her words out. Her appetite is decreased but she is trying to lose weight. At times she feels like everything around her disappears and she "zones out". Denise [name] has trouble processing conversations and often forgets things.  It seems to happen multiple times a day. She is working with her neurologist and has an appointment next month. Denise [name] denies SI/HI. Denise [name] shares her anxiety is worse. She is always anxious and has difficulty relaxing. As far as the PTSD goes- intrusive memories come in waves. She just deals with it when it happens.    Observations/Objective:  General Appearance: unable to assess  Eye Contact:  unable to assess  Speech:  Clear and  Coherent and Normal Rate  Volume:  Normal  Mood:  Anxious and Depressed  Affect:  Congruent  Thought Process:  concrete Coherent and Descriptions of Associations: Intact  Orientation:  Full (Time, Place, and Person)  Thought Content:  Logical  Suicidal Thoughts:  No  Homicidal Thoughts:  No  Memory:  Immediate;   Fair  Judgement:  Fair  Insight:  Fair  Psychomotor Activity: unable to assess  Concentration:  Concentration: Fair  Recall:  Poor  Fund of Knowledge:  Fair  Language:  Fair  Akathisia:  unable to assess  Handed:  unable to assess  AIMS (if indicated):     Assets:  Communication Skills Desire for Improvement Financial Resources/Insurance Housing Intimacy Social Support Talents/Skills Vocational/Educational  ADL's:  unable to assess  Cognition:  WNL  Sleep:          11/26/2021    9:29 AM 09/17/2021   10:23 AM 09/03/2021   10:09 AM 08/13/2021   10:42 AM 06/25/2021    1:45 PM  Depression screen PHQ 2/9  Decreased Interest '2 1 2 3 2  '$ Down, Depressed, Hopeless '3 2 3 3 3  '$ PHQ - 2 Score '5 3 5 6 5  '$ Altered sleeping 3 3 0 3 2  Tired, decreased energy '3 3 3 3 3  '$ Change in appetite '2 3 3 2 2  '$ Feeling bad or failure about yourself  '3 1 2 3 3  '$ Trouble concentrating '3 1 3 3 3  '$ Moving slowly or fidgety/restless '2 3 3 3 '$ 0  Suicidal thoughts 0 0 0 0 0  PHQ-9 Score '21 17 19 23 18  '$ Difficult doing work/chores Extremely dIfficult Very difficult Extremely dIfficult Extremely dIfficult Extremely dIfficult     Assessment and Plan:   Reports some mild improvement in depression symptoms regarding anhedonia and motivation but overall depression is ongoing. Ongoing PTSD symptoms. She is working with her neurologist regarding her neuro-cognitive disorder  Increase Depakote '500mg'$  po qHS to target mood symptoms 1. Severe episode of recurrent major depressive disorder, with psychotic features ([place]) - divalproex (DEPAKOTE ER) 500 MG 24 hr tablet; Take 1 tablet (500 mg total) by mouth  daily.  Dispense: 90 tablet; Refill: 0 - nortriptyline (PAMELOR) 75 MG capsule; Take 2 capsules (150 mg total) by mouth at bedtime.  Dispense: 180 capsule; Refill: 0  2. GAD (generalized anxiety disorder) - nortriptyline (PAMELOR) 75 MG capsule; Take 2 capsules (150 mg total) by mouth at bedtime.  Dispense: 180 capsule; Refill: 0  3. Insomnia due to medical condition - nortriptyline (PAMELOR) 75 MG capsule; Take 2 capsules (150 mg total) by mouth at bedtime.  Dispense: 180 capsule; Refill: 0  4. Chronic post-traumatic stress disorder (PTSD) - nortriptyline (PAMELOR) 75 MG capsule; Take 2 capsules (150 mg total) by mouth at bedtime.  Dispense: 180 capsule; Refill: 0    Follow Up Instructions: In 2-3 months or sooner if needed   I discussed the assessment and treatment plan with the patient. The patient was provided an opportunity to ask questions and all were answered. The patient agreed with the plan and demonstrated an understanding of the instructions.   The patient was advised to call back or seek an in-person evaluation if the symptoms worsen or if the condition fails to improve as anticipated.  I provided 16 minutes of non-face-to-face time during this encounter.   Charlcie Cradle, MD

## 2021-12-02 ENCOUNTER — Encounter: Payer: Self-pay | Admitting: Podiatry

## 2021-12-02 ENCOUNTER — Ambulatory Visit (INDEPENDENT_AMBULATORY_CARE_PROVIDER_SITE_OTHER): Payer: Medicaid Other | Admitting: Podiatry

## 2021-12-02 DIAGNOSIS — M79675 Pain in left toe(s): Secondary | ICD-10-CM | POA: Diagnosis not present

## 2021-12-02 DIAGNOSIS — E785 Hyperlipidemia, unspecified: Secondary | ICD-10-CM

## 2021-12-02 DIAGNOSIS — E1169 Type 2 diabetes mellitus with other specified complication: Secondary | ICD-10-CM | POA: Diagnosis not present

## 2021-12-02 DIAGNOSIS — M79674 Pain in right toe(s): Secondary | ICD-10-CM

## 2021-12-02 DIAGNOSIS — I739 Peripheral vascular disease, unspecified: Secondary | ICD-10-CM

## 2021-12-02 DIAGNOSIS — B351 Tinea unguium: Secondary | ICD-10-CM | POA: Diagnosis not present

## 2021-12-02 NOTE — Progress Notes (Signed)
This patient presents to the office with chief complaint of long thick nails     This patient says there are long thick painful nails.  These nails are painful walking and wearing shoes.   Patient is unable to  self treat her own nails . Patient has been diagnosed with diabetes and claudication. This patient presents  to the office today for treatment of the  long nails.  General Appearance  Alert, conversant and in no acute stress.  Vascular  Dorsalis pedis and posterior tibial  pulses are weakly  palpable  bilaterally.  Capillary return is within normal limits  bilaterally. Temperature is within normal limits  bilaterally.  Neurologic  Senn-Weinstein monofilament wire test within normal limits/diminished  B/L.  Muscle power within normal limits bilaterally.  Nails Thick disfigured discolored nails with subungual debris  from hallux to fifth toes bilaterally. No evidence of bacterial infection or drainage bilaterally.  Orthopedic  No limitations of motion of motion feet .  No crepitus or effusions noted.  No bony pathology or digital deformities noted.  Skin  Dry skin with no porokeratosis noted bilaterally.  No signs of infections or ulcers noted.     Onychomycosis    Debride nails x 10.    RTC 3 months.     Ermel Verne DPM  

## 2021-12-23 ENCOUNTER — Other Ambulatory Visit: Payer: Self-pay | Admitting: Neurology

## 2022-01-19 ENCOUNTER — Other Ambulatory Visit: Payer: Self-pay | Admitting: Pulmonary Disease

## 2022-01-21 NOTE — Progress Notes (Signed)
NEUROLOGY FOLLOW UP OFFICE NOTE  Jajaira Humphrey-Headen [id number]  Assessment/Plan:   1  Headache 2  Scalp neuralgia/left sided V1 trigeminal neuralgia 2  Cognitive disorder - unclear etiology - may be related to cerebrovascular disease, pharmacologic effect or psychiatric stressors.  Based on her age and lack of family history, an underlying neurodegenerative disease such as early-onset Alzheimer's is unlikely   Transition from nortriptyline to duloxetine '30mg'$  daily for cranial neuralgia pain. Continue gabapentin '900mg'$  three times daily  Check B12 Neuropsychological evaluation Follow up in 4 to 5 months.   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  Scalp pain in episodic, lasting 20 minutes 3 times a day.  However, she gets a paroxysmal shooting pain from the left eye up into center of head lasting 5 seconds.  Occurs 4-5 times a day.  Underwent workup for memory problems.  Labs included negative HIV and B12 245. Advised to start B12 1056mg daily.  However, she hasn't been taking it.  MRI of brain on 08/20/2021 personally reviewd revealed mild chronic small vessel ischemic changes within the cerebral white matter and pons with chronic infarct within the posterior right lentiform nucleus and left corona radiata and basal ganglia/internal capsule, stable compared to prior MRI from 08/05/2019.       Current NSAIDS:  none Current analgesics:  none Current anti-anxiolytic:  buspirone Current Antihypertensive medications:  Amlodipine, lisinopril, HCTZ Current Antidepressant/antipsychotic medications:  nortriptyline '150mg'$  at bedtime,  Current  Anticonvulsant medications:  Gabapentin '900mg'$  three times daily, Depakote ER '500mg'$  daily     HISTORY:  She developed headaches following hospitalization in late August 2021 for lithium toxicity presenting with confusion and headaches.  CT of head personally reviewed showed partially empty sella and known old left basal ganglia infarct but no acute intracranial abnormality.  She reports that the headaches have not improved since treatment.  She gets the headaches daily, about 2 hours a day.  It feels like that her entire top of head (in cap distribution) is being stabbed with hot pokers.  She also endorses a hot pressure sticking her in her left eye.  Also a pressure on top of head.  She reports associated drooling off of the left side of her mouth.  Separate from the headaches, she reports that she has periodic blurred vision.  She also zones out from time to time.  She has increased difficulty speaking and slurs her speech.  She has trouble just functioning and performing physical tasks.  MRI of brain without contrast on 11/05/2019 showed nonspecific chronic scattered white matter changes with chronic left basal ganglia infarcts and remote pontine microhemorrhage  Her husband is concerned about her memory.  She easily forgets conversations.  She has had memory deficits for some time. Sometimes has trouble getting words out.  No known family history of Alzheimer's     Past NSAIDS:  Ibuprofen, meloxicam Past analgesics:  tramadol Past antidepressant medications:  Fluoxetine, citalopram, mirtazepine, Sertraline '200mg'$ , [name]  Past anticonvulsant medications:  topiramate '50mg'$   PAST MEDICAL HISTORY: Past Medical History:  Diagnosis Date   Abnormal EKG    AD (atopic dermatitis)    Alcohol use 04/14/2015   Anxiety    Arthritis    Blood  transfusion without reported diagnosis    Depression    Diabetes mellitus without complication ([place])    Fibroid uterus 12/18/2014   Hyperlipemia    Hypertension     Lithium toxicity 09/25/2019   Memory changes    Overactive bladder    Pelvic pain in female 12/18/2014   Prediabetes    Right sided weakness    Seizures ([place])    Stroke [facility])    Substance abuse ([place])    Syncope 02/07/2015   TIA (transient ischemic attack)    Tobacco use 04/14/2015    MEDICATIONS: Current Outpatient Medications on File Prior to Visit  Medication Sig Dispense Refill   amLODipine (NORVASC) 10 MG tablet Take 10 mg by mouth daily.     atorvastatin (LIPITOR) 20 MG tablet TAKE 1 TABLET BY MOUTH EVERY DAY 90 tablet 3   budesonide-formoterol (SYMBICORT) 160-4.5 MCG/ACT inhaler Inhale 2 puffs into the lungs in the morning and at bedtime. 10.2 g 11   divalproex (DEPAKOTE ER) 500 MG 24 hr tablet Take 1 tablet (500 mg total) by mouth daily. 90 tablet 0   empagliflozin (JARDIANCE) 10 MG TABS tablet Take 1 tablet (10 mg total) by mouth daily. 30 tablet 1   furosemide (LASIX) 80 MG tablet TAKE 1 TABLET BY MOUTH EVERY DAY 90 tablet 1   gabapentin (NEURONTIN) 300 MG capsule TAKE 3 CAPSULES BY MOUTH 3 TIMES DAILY 270 capsule 0   losartan (COZAAR) 25 MG tablet Take 1 tablet (25 mg total) by mouth daily. 30 tablet 1   meloxicam (MOBIC) 15 MG tablet Take 15 mg by mouth daily.     metFORMIN (GLUCOPHAGE) 500 MG tablet Take 500 mg by mouth 2 (two) times daily.  3   nicotine (NICODERM CQ - DOSED IN MG/24 HOURS) 14 mg/24hr patch Place 1 patch (14 mg total) onto the skin daily. 28 patch 0   nortriptyline (PAMELOR) 75 MG capsule Take 2 capsules (150 mg total) by mouth at bedtime. 180 capsule 0   Polyethylene Glycol 3350 (PEG 3350) 17 GM/SCOOP POWD Take 17 g by mouth daily as needed (constipation).     potassium chloride SA (KLOR-CON M) 20 MEQ tablet Take 1 tablet (20 mEq total) by mouth daily. 30 tablet 1   Spacer/Aero-Holding Chambers (AEROCHAMBER MV) inhaler Use as instructed 1 each 0   VENTOLIN HFA 108 (90 Base) MCG/ACT inhaler inhale 2 PUFFS into THE lungs EVERY 6 HOURS AS NEEDED FOR  WHEEZING OR SHORTNESS OF BREATH 18 g 11   No current facility-administered medications on file prior to visit.    ALLERGIES: Allergies  Allergen Reactions   Gadolinium Derivatives Nausea And Vomiting    Nausea and vomiting was followed by epileptic seizure episode that lasted approximately 5 minutes; pt was unable to verbally communicate during that time; she then came to and was able to speak; was evaluated by Rad and RN; kms    Grapefruit Flavor [Flavoring Agent] Other (See Comments)    Drug interaction    FAMILY HISTORY: Family History  Problem Relation Age of Onset   Stroke Mother    Anxiety disorder Mother    Drug abuse Mother    Alcohol abuse Mother    Depression Mother    Drug abuse Father    Anxiety disorder Father    Heart disease Maternal Grandmother    Colon cancer Neg Hx    Esophageal cancer Neg Hx    Pancreatic cancer Neg Hx    Stomach cancer Neg Hx  Liver disease Neg Hx    Breast cancer Neg Hx       Objective:  Blood pressure (!) 155/79, pulse 93, height '5\' 5"'$  (1.651 m), weight (!) 347 lb (157.4 kg), last menstrual period 05/29/2017, SpO2 98 %. General: No acute distress.  Patient appears well-groomed.   Head:  Normocephalic/atraumatic Eyes:  Fundi examined but not visualized Neck: supple, no paraspinal tenderness, full range of motion Heart:  Regular rate and rhythm Neurological Exam: alert and oriented to person only.  Speech fluent and not dysarthric, language intact.      01/22/2022   11:00 AM 04/14/2015   11:34 AM  MMSE - Mini Mental State Exam  Orientation to time 4 3  Orientation to Place 4 2  Registration 3 3  Attention/ Calculation 0 0  Recall 2 0  Language- name 2 objects 2 2  Language- repeat 0 0  Language- follow 3 step command 3 3  Language- read & follow direction 1 1  Write a sentence 1 1  Copy design 0 0  Total score 20 15   Metta Clines, DO

## 2022-01-22 ENCOUNTER — Ambulatory Visit (INDEPENDENT_AMBULATORY_CARE_PROVIDER_SITE_OTHER): Payer: Medicaid Other | Admitting: Neurology

## 2022-01-22 ENCOUNTER — Other Ambulatory Visit (INDEPENDENT_AMBULATORY_CARE_PROVIDER_SITE_OTHER): Payer: Medicaid Other

## 2022-01-22 ENCOUNTER — Encounter: Payer: Self-pay | Admitting: Neurology

## 2022-01-22 VITALS — BP 155/79 | HR 93 | Ht 65.0 in | Wt 347.0 lb

## 2022-01-22 DIAGNOSIS — G500 Trigeminal neuralgia: Secondary | ICD-10-CM

## 2022-01-22 DIAGNOSIS — R413 Other amnesia: Secondary | ICD-10-CM | POA: Diagnosis not present

## 2022-01-22 DIAGNOSIS — F445 Conversion disorder with seizures or convulsions: Secondary | ICD-10-CM

## 2022-01-22 LAB — VITAMIN B12: Vitamin B-12: 512 pg/mL (ref 211–911)

## 2022-01-22 MED ORDER — DULOXETINE HCL 30 MG PO CPEP: 30.0000 mg | ORAL_CAPSULE | Freq: Every day | ORAL | 5 refills | Status: DC

## 2022-01-22 NOTE — Patient Instructions (Signed)
Decrease nortriptyline '75mg'$  to one pill at bedtime for one week, then STOP After stopping nortriptyline, start duloxetine '30mg'$  daily Continue gabapentin '900mg'$  three times daily Check B12 level. Order neurocognitive evaluation Follow up 4 to 5 months.

## 2022-01-27 NOTE — Progress Notes (Signed)
LMOVM for patient to give the office back.

## 2022-01-28 ENCOUNTER — Telehealth (HOSPITAL_COMMUNITY): Payer: Medicaid Other | Admitting: Psychiatry

## 2022-01-28 ENCOUNTER — Telehealth (HOSPITAL_COMMUNITY): Payer: Self-pay | Admitting: Psychiatry

## 2022-01-28 NOTE — Telephone Encounter (Signed)
Patient was not present on video platform used through mychart. I called the patient at our scheduled appointment time. There was no answer. I left a voice message for patient to call the clinic back at their convinence. There was no return phone call during out scheduled visit time. I was not able to speak with the patient today, as they were a no show for their scheduled appointment.   

## 2022-02-12 ENCOUNTER — Telehealth: Payer: Self-pay

## 2022-02-12 ENCOUNTER — Other Ambulatory Visit: Payer: Self-pay | Admitting: Pulmonary Disease

## 2022-02-12 MED ORDER — GABAPENTIN 300 MG PO CAPS: ORAL_CAPSULE | ORAL | 0 refills | Status: DC

## 2022-02-12 NOTE — Telephone Encounter (Signed)
[facility]

## 2022-02-12 NOTE — Telephone Encounter (Signed)
MEDICATION:gabapentin (NEURONTIN) 300 MG capsule   PHARMACY:  [facility] - [place], [place] - [address] (Ph: [phone])  Comments:   **Let patient know to contact pharmacy at the end of the day to make sure medication is ready. **  ** Please notify patient to allow 48-72 hours to process**  **Encourage patient to contact the pharmacy for refills or they can request refills through [facility]**

## 2022-02-25 ENCOUNTER — Telehealth (HOSPITAL_COMMUNITY): Payer: Medicaid Other | Admitting: Psychiatry

## 2022-02-25 ENCOUNTER — Telehealth (HOSPITAL_COMMUNITY): Payer: Self-pay | Admitting: Psychiatry

## 2022-02-25 NOTE — Telephone Encounter (Signed)
Patient was supposed to come for an in person visit. She was not in the clinic. Patient was not present on video platform used through [employer]. I called the patient at our scheduled appointment time. There was no answer. I left a voice message for patient to call the clinic back at their convinence. There was no return phone call during out scheduled visit time. I was not able to speak with the patient today, as they were a no show for their scheduled appointment.

## 2022-03-01 ENCOUNTER — Other Ambulatory Visit: Payer: Self-pay | Admitting: Psychiatry

## 2022-03-01 DIAGNOSIS — Z1231 Encounter for screening mammogram for malignant neoplasm of breast: Secondary | ICD-10-CM

## 2022-03-10 ENCOUNTER — Encounter: Payer: Self-pay | Admitting: Podiatry

## 2022-03-10 ENCOUNTER — Ambulatory Visit (INDEPENDENT_AMBULATORY_CARE_PROVIDER_SITE_OTHER): Payer: Medicaid Other | Admitting: Podiatry

## 2022-03-10 DIAGNOSIS — B351 Tinea unguium: Secondary | ICD-10-CM

## 2022-03-10 DIAGNOSIS — E785 Hyperlipidemia, unspecified: Secondary | ICD-10-CM

## 2022-03-10 DIAGNOSIS — M79675 Pain in left toe(s): Secondary | ICD-10-CM

## 2022-03-10 DIAGNOSIS — M79674 Pain in right toe(s): Secondary | ICD-10-CM | POA: Diagnosis not present

## 2022-03-10 DIAGNOSIS — E1169 Type 2 diabetes mellitus with other specified complication: Secondary | ICD-10-CM

## 2022-03-10 NOTE — Progress Notes (Signed)
This patient presents to the office with chief complaint of long thick nails     This patient says there are long thick painful nails.  These nails are painful walking and wearing shoes.   Patient is unable to  self treat her own nails . Patient has been diagnosed with diabetes and claudication. This patient presents  to the office today for treatment of the  long nails.  General Appearance  Alert, conversant and in no acute stress.  Vascular  Dorsalis pedis and posterior tibial  pulses are weakly  palpable  bilaterally.  Capillary return is within normal limits  bilaterally. Temperature is within normal limits  bilaterally.  Neurologic  Senn-Weinstein monofilament wire test within normal limits/diminished  B/L.  Muscle power within normal limits bilaterally.  Nails Thick disfigured discolored nails with subungual debris  from hallux to fifth toes bilaterally. No evidence of bacterial infection or drainage bilaterally.  Orthopedic  No limitations of motion of motion feet .  No crepitus or effusions noted.  No bony pathology or digital deformities noted.  Skin  Dry skin with no porokeratosis noted bilaterally.  No signs of infections or ulcers noted.     Onychomycosis    Debride nails x 10.    RTC 3 months.     Gardiner Barefoot DPM

## 2022-03-15 ENCOUNTER — Telehealth: Payer: Self-pay | Admitting: Neurology

## 2022-03-15 ENCOUNTER — Other Ambulatory Visit: Payer: Self-pay

## 2022-03-15 MED ORDER — GABAPENTIN 300 MG PO CAPS: ORAL_CAPSULE | ORAL | 1 refills | Status: DC

## 2022-03-15 NOTE — Telephone Encounter (Signed)
1. Which medications need refilled? (List name and dosage, if known) gabapentin  2. Which pharmacy/location is medication to be sent to? (include street and city if local pharmacy) [place] [facility]

## 2022-03-26 ENCOUNTER — Other Ambulatory Visit (HOSPITAL_COMMUNITY): Payer: Self-pay | Admitting: Psychiatry

## 2022-03-26 DIAGNOSIS — F333 Major depressive disorder, recurrent, severe with psychotic symptoms: Secondary | ICD-10-CM

## 2022-04-12 ENCOUNTER — Ambulatory Visit
Admission: RE | Admit: 2022-04-12 | Discharge: 2022-04-12 | Disposition: A | Discharge status: Home / Self Care | Payer: Medicaid Other | Source: Ambulatory Visit | Attending: Psychiatry | Admitting: Psychiatry

## 2022-04-12 DIAGNOSIS — Z1231 Encounter for screening mammogram for malignant neoplasm of breast: Secondary | ICD-10-CM

## 2022-04-29 ENCOUNTER — Ambulatory Visit (HOSPITAL_BASED_OUTPATIENT_CLINIC_OR_DEPARTMENT_OTHER): Payer: Medicaid Other | Admitting: Psychiatry

## 2022-04-29 VITALS — BP 137/77 | HR 109 | Resp 20 | Ht 64.0 in | Wt 333.0 lb

## 2022-04-29 DIAGNOSIS — F411 Generalized anxiety disorder: Secondary | ICD-10-CM

## 2022-04-29 DIAGNOSIS — F4312 Post-traumatic stress disorder, chronic: Secondary | ICD-10-CM | POA: Diagnosis not present

## 2022-04-29 DIAGNOSIS — F333 Major depressive disorder, recurrent, severe with psychotic symptoms: Secondary | ICD-10-CM

## 2022-04-29 DIAGNOSIS — G4701 Insomnia due to medical condition: Secondary | ICD-10-CM | POA: Diagnosis not present

## 2022-04-29 MED ORDER — VORTIOXETINE HBR 5 MG PO TABS: 5.0000 mg | ORAL_TABLET | Freq: Every day | ORAL | 0 refills | Status: DC

## 2022-04-29 NOTE — Progress Notes (Signed)
[place] MD/PA/NP OP Progress Note  04/29/2022 6:20 PM Denise [name]  MRN:  [id number]  Chief Complaint:  Chief Complaint  Patient presents with  . Depression   HPI:  Denise [name] shares that she feels mostly numb and is just watching as things happen around her. Her depression is constant and the level is 9/10 (10 being the worst). Her sleep is poor even with her CPAP. Her sleep is broken and she is usually wakes up after 1-2 hours. Once awake she can not fall aback a sleep. She tries to nap but is not sure if she gets any sleep. Her energy is very low. Her appetite and concentration are poor. She has on/off psychomotor retardation. Denise [name] is endorsing isolation. She is endorsing worthlessness for the last 2 weeks.  Her motivation is very low. She denies passive thoughts of death. She denies SI/HI. Her son is living with her and her husband. [name] does not thinks she needs inpatient psychiatric treatment at this time. She states she is so numb that she is not sure if she is having anxiety. [name] denies any PTSD symptoms and states she feels safe when home with her husband and son. Denise [name] states that none of her meds seem to be working. She is not sure if she having AVH.    Visit Diagnosis:    ICD-10-CM   1. Severe episode of recurrent major depressive disorder, with psychotic features  F33.3 vortioxetine HBr (TRINTELLIX) 5 MG TABS tablet    2. GAD (generalized anxiety disorder)  F41.1     3. Insomnia due to medical condition  G47.01     4. Chronic post-traumatic stress disorder (PTSD)  F43.12       Past Psychiatric History: no updates per patient- see H&P  Past Medical History:  Past Medical History:  Diagnosis Date  . Abnormal EKG   . AD (atopic dermatitis)   . Alcohol use 04/14/2015  . Anxiety   . Arthritis   . Blood transfusion without reported diagnosis   . Depression   . Diabetes mellitus without complication ([place])   . Fibroid uterus 12/18/2014  . Hyperlipemia   .  Hypertension   . Lithium toxicity 09/25/2019  . Memory changes   . Overactive bladder   . Pelvic pain in female 12/18/2014  . Prediabetes   . Right sided weakness   . Seizures ([place])   . Stroke ([place])   . Substance abuse ([place])   . Syncope 02/07/2015  . TIA (transient ischemic attack)   . Tobacco use 04/14/2015    Past Surgical History:  Procedure Laterality Date  . CESAREAN SECTION     x 2    Family Psychiatric and Medical History:  Family History  Problem Relation Age of Onset  . Stroke Mother   . Anxiety disorder Mother   . Drug abuse Mother   . Alcohol abuse Mother   . Depression Mother   . Drug abuse Father   . Anxiety disorder Father   . Heart disease Maternal Grandmother   . Colon cancer Neg Hx   . Esophageal cancer Neg Hx   . Pancreatic cancer Neg Hx   . Stomach cancer Neg Hx   . Liver disease Neg Hx   . Breast cancer Neg Hx     Social History:  Social History   Socioeconomic History  . Marital status: Married    Spouse name: [name]  . Number of children: 3  . Years of education: 6  . Highest education  level: 6th grade  Occupational History  . Not on file  Tobacco Use  . Smoking status: Every Day    Packs/day: .4    Types: Cigarettes  . Smokeless tobacco: Never  . Tobacco comments:    Smoking 1 pack of cigarettes a week. 09/21/21 [place]  Vaping Use  . Vaping Use: Never used  Substance and Sexual Activity  . Alcohol use: Not Currently    Alcohol/week: 2.0 standard drinks of alcohol    Types: 2 Standard drinks or equivalent per week  . Drug use: Not Currently    Types: "Crack" cocaine  . Sexual activity: Yes    Partners: Male    Birth control/protection: None  Other Topics Concern  . Not on file  Social History Narrative  . Not on file   Social Determinants of Health   Financial Resource Strain: High Risk (09/10/2017)   Overall Financial Resource Strain (CARDIA)   . Difficulty of Paying Living Expenses: Very hard  Food Insecurity:  Food Insecurity Present (09/10/2017)   Hunger Vital Sign   . Worried About Charity fundraiser in the Last Year: Often true   . Ran Out of Food in the Last Year: Often true  Transportation Needs: Unmet Transportation Needs (09/10/2017)   PRAPARE - Transportation   . Lack of Transportation (Medical): Yes   . Lack of Transportation (Non-Medical): Yes  Physical Activity: Inactive (09/10/2017)   Exercise Vital Sign   . Days of Exercise per Week: 0 days   . Minutes of Exercise per Session: 0 min  Stress: Stress Concern Present (09/10/2017)   [place]   . Feeling of Stress : Very much  Social Connections: Unknown (09/10/2017)   Social Connection and Isolation Panel [NHANES]   . Frequency of Communication with Friends and Family: Not on file   . Frequency of Social Gatherings with Friends and Family: Not on file   . Attends Religious Services: Never   . Active Member of Clubs or Organizations: No   . Attends Archivist Meetings: Never   . Marital Status: Married    Allergies:  Allergies  Allergen Reactions  . Gadolinium Derivatives Nausea And Vomiting    Nausea and vomiting was followed by epileptic seizure episode that lasted approximately 5 minutes; pt was unable to verbally communicate during that time; she then came to and was able to speak; was evaluated by Rad and RN; kms   . Grapefruit Flavor [Flavoring Agent] Other (See Comments)    Drug interaction    Metabolic Disorder Labs: Lab Results  Component Value Date   HGBA1C 6.5 (H) 07/29/2021   MPG 139.85 07/29/2021   MPG 108.28 09/25/2019   Lab Results  Component Value Date   PROLACTIN 8.1 10/30/2018   Lab Results  Component Value Date   CHOL 150 02/24/2021   TRIG 91 02/24/2021   HDL 49 02/24/2021   CHOLHDL 3.1 02/24/2021   VLDL 70 (H) 02/08/2015   LDLCALC 84 02/24/2021   LDLCALC 114 (H) 11/17/2020   Lab Results  Component Value Date   TSH  1.460 11/17/2020   TSH 5.147 (H) 09/25/2019    Therapeutic Level Labs: Lab Results  Component Value Date   LITHIUM 1.00 09/27/2019   LITHIUM 1.35 (H) 09/26/2019   No results found for: "VALPROATE" No results found for: "CBMZ"  Current Medications: Current Outpatient Medications  Medication Sig Dispense Refill  . amLODipine (NORVASC) 10 MG tablet Take 10 mg  by mouth daily.    Marland Kitchen atorvastatin (LIPITOR) 20 MG tablet TAKE 1 TABLET BY MOUTH EVERY DAY 90 tablet 3  . budesonide-formoterol (SYMBICORT) 160-4.5 MCG/ACT inhaler Inhale 2 puffs into the lungs in the morning and at bedtime. 10.2 g 11  . DULoxetine (CYMBALTA) 30 MG capsule Take 1 capsule (30 mg total) by mouth daily. 30 capsule 5  . empagliflozin (JARDIANCE) 10 MG TABS tablet Take 1 tablet (10 mg total) by mouth daily. 30 tablet 1  . furosemide (LASIX) 80 MG tablet TAKE 1 TABLET BY MOUTH EVERY DAY 90 tablet 1  . gabapentin (NEURONTIN) 300 MG capsule TAKE 3 CAPSULES BY MOUTH 3 TIMES DAILY 270 capsule 1  . lisinopril (ZESTRIL) 20 MG tablet Take 20 mg by mouth 2 (two) times daily.    Marland Kitchen losartan (COZAAR) 25 MG tablet Take 1 tablet (25 mg total) by mouth daily. 30 tablet 1  . meloxicam (MOBIC) 15 MG tablet Take 15 mg by mouth daily.    . metFORMIN (GLUCOPHAGE) 500 MG tablet Take 500 mg by mouth 2 (two) times daily.  3  . Polyethylene Glycol 3350 (PEG 3350) 17 GM/SCOOP POWD Take 17 g by mouth daily as needed (constipation).    . potassium chloride SA (KLOR-CON M) 20 MEQ tablet Take 1 tablet (20 mEq total) by mouth daily. 30 tablet 1  . Spacer/Aero-Holding Chambers (AEROCHAMBER MV) inhaler Use as instructed 1 each 0  . vortioxetine HBr (TRINTELLIX) 5 MG TABS tablet Take 1 tablet (5 mg total) by mouth daily. 30 tablet 0  . nicotine (NICODERM CQ - DOSED IN MG/24 HOURS) 14 mg/24hr patch Place 1 patch (14 mg total) onto the skin daily. (Patient not taking: Reported on 04/29/2022) 28 patch 0  . VENTOLIN HFA 108 (90 Base) MCG/ACT inhaler inhale 2  PUFFS into THE lungs EVERY 6 HOURS AS NEEDED FOR WHEEZING OR SHORTNESS OF BREATH (Patient not taking: Reported on 04/29/2022) 18 g 11   No current facility-administered medications for this visit.     Musculoskeletal: Strength & Muscle Tone: decreased Gait & Station: broad based Patient leans: Front  Psychiatric Specialty Exam: Review of Systems  Blood pressure 137/77, pulse (!) 109, resp. rate 20, height 5\' 4"  (1.626 m), weight (!) 333 lb (151 kg), last menstrual period 05/29/2017, SpO2 91 %.Body mass index is 57.16 kg/m.  General Appearance: Disheveled  Eye Contact:  Fair  Speech:  Clear and Coherent and Normal Rate  Volume:  Normal  Mood:  Depressed  Affect:  Congruent  Thought Process:  Coherent and Descriptions of Associations: Intact  Orientation:  Full (Time, Place, and Person)  Thought Content: Logical   Suicidal Thoughts:  No  Homicidal Thoughts:  No  Memory:  Immediate;   Poor  Judgement:  Fair  Insight:  Fair  Psychomotor Activity:  Normal  Concentration:  Concentration: Fair  Recall:  Poor  Fund of Knowledge: Fair  Language: Fair  Akathisia:  No  Handed:  Right  AIMS (if indicated): not done  Assets:  Communication Skills Desire for Improvement Financial Resources/Insurance Housing Intimacy Social Support Talents/Skills Transportation Vocational/Educational  ADL's:  Intact  Cognition: WNL  Sleep:  Poor   Screenings: GAD-7    Flowsheet Row Office Visit from 07/15/2016 in [place] [facility]  Total GAD-7 Score [place] Office Visit from 01/22/2022 in [facility]rology Office Visit from 04/14/2015 in [facility]  Total Score (max 30  points ) 20 15      PHQ2-9    [place] Visit from 04/29/2022 in [facility] Video Visit from 11/26/2021 in [place] [facility] Video Visit from 09/17/2021 in  [place] [facility] Video Visit from 09/03/2021 in [place] [facility] [facility] Video Visit from 08/13/2021 in [place] [facility]  PHQ-2 Total Score 6 5 3 5 6   PHQ-9 Total Score 23 21 17 19 23       [place] Office Visit from 04/29/2022 in [place] [facility] Video Visit from 11/26/2021 in [place] [facility] Video Visit from 09/17/2021 in [place] No Risk No Risk No Risk        Assessment and Plan:  Confidentiality and exclusions reviewed with pt who verbalized understanding.   Medication management with supportive therapy. Risks and benefits, side effects and alternative treatment options discussed with patient. Pt was given an opportunity to ask questions about medication, illness, and treatment. All current psychiatric medications have been reviewed and discussed with the patient and adjusted as clinically appropriate. The patient has been provided an accurate and updated list of the medications being now prescribed. Pt verbalized understanding and verbal consent obtained for treatment.     Status of current problems: worsening depression symptoms  Meds: d/c Cymbalta 60mg  po qD for depression D/c Depakote - ineffective D/C Nortriptyline - Elayah is not sure who d/c it but doesn't want to restart  Failed trials: Lithium, Depakote, Buspar, Ambien, Nortriptyline, [name], Zoloft, Abilify, Latuda  Start trial of Trintellix for MDD  Labs: none today  Therapy: brief supportive therapy provided. Discussed psychosocial stressors in detail.   Encouraged pt to develop daily routine and work on daily goal setting as a way to improve mood symptoms.     F/up in 2 weeks or sooner if needed  The duration of this appointment visit was 22 minutes of face-to-face time with the  patient.  Greater than 50% of this time was spent in counseling, explanation of  diagnosis, planning of further management, and coordination of care    Collaboration of Care: Collaboration of Care: Other declined ECT and  decline therapy  Patient/Guardian was advised Release of Information must be obtained prior to any record release in order to collaborate their care with an outside provider. Patient/Guardian was advised if they have not already done so to contact the registration department to sign all necessary forms in order for [place] to release information regarding their care.   Consent: Patient/Guardian gives verbal consent for treatment and assignment of benefits for services provided during this visit. Patient/Guardian expressed understanding and agreed to proceed.    Charlcie Cradle, MD 04/29/2022, 6:20 PM

## 2022-05-13 ENCOUNTER — Telehealth (HOSPITAL_COMMUNITY): Payer: Self-pay | Admitting: Psychiatry

## 2022-05-13 ENCOUNTER — Telehealth (HOSPITAL_COMMUNITY): Payer: Medicaid Other | Admitting: Psychiatry

## 2022-05-13 NOTE — Telephone Encounter (Signed)
Patient was not present on video platform used through [employer] at our scheduled appointment time. I called the patient at our scheduled appointment time. There was no answer. I was unable to leave a voice message, as the mailbox was full.  I was not able to speak with the patient today, as they were a no show for their scheduled appointment.

## 2022-05-14 ENCOUNTER — Other Ambulatory Visit: Payer: Self-pay | Admitting: Neurology

## 2022-05-14 ENCOUNTER — Other Ambulatory Visit: Payer: Self-pay | Admitting: Internal Medicine

## 2022-05-24 ENCOUNTER — Other Ambulatory Visit (HOSPITAL_COMMUNITY): Payer: Self-pay | Admitting: Psychiatry

## 2022-05-24 DIAGNOSIS — F333 Major depressive disorder, recurrent, severe with psychotic symptoms: Secondary | ICD-10-CM

## 2022-05-28 ENCOUNTER — Other Ambulatory Visit (HOSPITAL_COMMUNITY): Payer: Self-pay | Admitting: Psychiatry

## 2022-05-28 DIAGNOSIS — F333 Major depressive disorder, recurrent, severe with psychotic symptoms: Secondary | ICD-10-CM

## 2022-06-03 ENCOUNTER — Telehealth (HOSPITAL_COMMUNITY): Payer: Self-pay | Admitting: Psychiatry

## 2022-06-03 ENCOUNTER — Telehealth (HOSPITAL_COMMUNITY): Payer: Medicaid Other | Admitting: Psychiatry

## 2022-06-09 ENCOUNTER — Ambulatory Visit (INDEPENDENT_AMBULATORY_CARE_PROVIDER_SITE_OTHER): Payer: Medicaid Other | Admitting: Podiatry

## 2022-06-09 ENCOUNTER — Encounter: Payer: Self-pay | Admitting: Podiatry

## 2022-06-09 DIAGNOSIS — M79675 Pain in left toe(s): Secondary | ICD-10-CM | POA: Diagnosis not present

## 2022-06-09 DIAGNOSIS — E785 Hyperlipidemia, unspecified: Secondary | ICD-10-CM

## 2022-06-09 DIAGNOSIS — B351 Tinea unguium: Secondary | ICD-10-CM

## 2022-06-09 DIAGNOSIS — M79674 Pain in right toe(s): Secondary | ICD-10-CM | POA: Diagnosis not present

## 2022-06-09 DIAGNOSIS — E1169 Type 2 diabetes mellitus with other specified complication: Secondary | ICD-10-CM

## 2022-06-09 NOTE — Progress Notes (Signed)
This patient presents to the office with chief complaint of long thick nails     This patient says there are long thick painful nails.  These nails are painful walking and wearing shoes.   Patient is unable to  self treat her own nails . Patient has been diagnosed with diabetes and claudication. This patient presents  to the office today for treatment of the  long nails.  General Appearance  Alert, conversant and in no acute stress.  Vascular  Dorsalis pedis and posterior tibial  pulses are weakly  palpable  bilaterally.  Capillary return is within normal limits  bilaterally. Temperature is within normal limits  bilaterally.  Neurologic  Senn-Weinstein monofilament wire test within normal limits/diminished  B/L.  Muscle power within normal limits bilaterally.  Nails Thick disfigured discolored nails with subungual debris  from hallux to fifth toes bilaterally. No evidence of bacterial infection or drainage bilaterally.  Orthopedic  No limitations of motion of motion feet .  No crepitus or effusions noted.  No bony pathology or digital deformities noted.  Skin  Dry skin with no porokeratosis noted bilaterally.  No signs of infections or ulcers noted.     Onychomycosis    Debride nails x 10.    RTC 3 months.     Travonne Schowalter DPM  

## 2022-06-11 ENCOUNTER — Telehealth: Payer: Self-pay | Admitting: Anesthesiology

## 2022-06-11 ENCOUNTER — Other Ambulatory Visit: Payer: Self-pay

## 2022-06-11 MED ORDER — GABAPENTIN 300 MG PO CAPS: ORAL_CAPSULE | ORAL | 0 refills | Status: DC

## 2022-06-11 NOTE — Telephone Encounter (Signed)
Pt left message with AN stating she needs a refill on her Gabapentin medication.

## 2022-06-23 ENCOUNTER — Other Ambulatory Visit: Payer: Self-pay | Admitting: Neurology

## 2022-06-23 NOTE — Progress Notes (Unsigned)
NEUROLOGY FOLLOW UP OFFICE NOTE  Denise [name] [id number]  Assessment/Plan:   1  Headache 2  Scalp neuralgia/left sided V1 trigeminal neuralgia 2  Cognitive disorder - unclear etiology - may be related to cerebrovascular disease, pharmacologic effect or psychiatric stressors.  Based on her age (52) and lack of family history, an underlying neurodegenerative disease such as early-onset Alzheimer's is unlikely   Increase duloxetine to 60mg  daily.  If no improvement in 2 months, change to oxcarbazepine 150mg  twice daily.  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  For scalp neuralgia, gabapentin was increased to 900mg  TID.  Scalp pain in episodic, lasting 20 minutes 3 times a day.  However, she gets a paroxysmal shooting pain from the left eye up into center of head lasting 5 seconds.  Occurs 3 times a day.   Repeat B12 level was 512.       Current NSAIDS:  none Current analgesics:  none Current anti-anxiolytic:  buspirone Current Antihypertensive medications:  Amlodipine, lisinopril, HCTZ Current Antidepressant/antipsychotic medications: duloxetine 30mg  daily Current Anticonvulsant medications:  Gabapentin 900mg  three times daily, Depakote ER 500mg  daily     HISTORY:  She developed headaches following hospitalization in late August 2021 for lithium toxicity presenting with confusion and headaches.  CT of head personally reviewed showed partially empty sella and known old left basal ganglia  infarct but no acute intracranial abnormality.  She reports that the headaches have not improved since treatment.  She gets the headaches daily, about 2 hours a day.  It feels like that her entire top of head (in cap distribution) is being stabbed with hot pokers.  She also endorses a hot pressure sticking her in her left eye.  Also a pressure on top of head.  Scalp pain in episodic, lasting 20 minutes 3 times a day.  However, she gets a paroxysmal shooting pain from the left eye up into center of head lasting 5 seconds.  She reports associated drooling off of the left side of her mouth.  Separate from the headaches, she reports that she has periodic blurred vision.  She also zones out from time to time.  She has increased difficulty speaking and slurs her speech.  She has trouble just functioning and performing physical tasks.  MRI of brain without contrast on 11/05/2019 showed nonspecific chronic scattered white matter changes with chronic left basal ganglia infarcts and remote pontine microhemorrhage   Her husband is concerned about her memory.  She easily forgets conversations.  She has had memory deficits for some time. Sometimes has trouble getting words out.  No known family history of Alzheimer's.  Labs included negative HIV, negative RPR, and B12 245. Advised to start B12 daily.  However, she hasn't been taking it.  MRI of brain on 08/20/2021 personally reviewd revealed mild chronic small vessel ischemic changes within the cerebral white matter and pons with chronic infarct within the posterior right lentiform nucleus and left corona radiata and basal ganglia/internal capsule, stable compared to prior MRI from 08/05/2019.       Past NSAIDS:  Ibuprofen, meloxicam Past analgesics:  tramadol Past antidepressant medications:  Nortriptyline, Fluoxetine, citalopram, mirtazepine, Sertraline 200mg , [name]  Past anticonvulsant medications:  topiramate 50mg   PAST MEDICAL HISTORY: Past Medical  History:  Diagnosis Date   Abnormal EKG    AD (atopic dermatitis)    Alcohol use 04/14/2015   Anxiety    Arthritis    Blood transfusion without reported diagnosis    Depression    Diabetes mellitus without complication (HCC)    Fibroid uterus 12/18/2014   Hyperlipemia    Hypertension    Lithium toxicity 09/25/2019   Memory changes    Overactive bladder    Pelvic pain in female 12/18/2014   Prediabetes    Right sided weakness    Seizures (HCC)    Stroke (HCC)    Substance abuse (HCC)    Syncope 02/07/2015   TIA (transient ischemic attack)    Tobacco use 04/14/2015    MEDICATIONS: Current Outpatient Medications on File Prior to Visit  Medication Sig Dispense Refill   amLODipine (NORVASC) 10 MG tablet Take 10 mg by mouth daily.     atorvastatin (LIPITOR) 20 MG tablet TAKE 1 TABLET BY MOUTH EVERY DAY 90 tablet 3   budesonide-formoterol (SYMBICORT) 160-4.5 MCG/ACT inhaler Inhale 2 puffs into the lungs in the morning and at bedtime. 10.2 g 11   DULoxetine (CYMBALTA) 30 MG capsule Take 1 capsule (30 mg total) by mouth daily. 30 capsule 5   empagliflozin (JARDIANCE) 10 MG TABS tablet Take 1 tablet (10 mg total) by mouth daily. 30 tablet 1   furosemide (LASIX) 80 MG tablet TAKE 1 TABLET BY MOUTH EVERY DAY 90 tablet 0   gabapentin (NEURONTIN) 300 MG capsule TAKE 3 CAPSULES BY MOUTH 3 TIMES DAILY 270 capsule 0   lisinopril (ZESTRIL) 20 MG tablet Take 20 mg by mouth 2 (two) times daily.     losartan (COZAAR) 25 MG tablet Take 1 tablet (25 mg total) by mouth daily. 30 tablet 1   meloxicam (MOBIC) 15 MG tablet Take 15 mg by mouth daily.     metFORMIN (GLUCOPHAGE) 500 MG tablet Take 500 mg by mouth 2 (two) times daily.  3   nicotine (NICODERM CQ - DOSED IN MG/24 HOURS) 14 mg/24hr patch Place 1 patch (14 mg total) onto the skin daily. (Patient not taking: Reported on 04/29/2022) 28 patch 0   Polyethylene Glycol 3350 (PEG 3350) 17 GM/SCOOP POWD Take 17 g by mouth daily as needed (constipation).      potassium chloride SA (KLOR-CON M) 20 MEQ tablet Take 1 tablet (20 mEq total) by mouth daily. 30 tablet 1   Spacer/Aero-Holding Chambers (AEROCHAMBER MV) inhaler Use as instructed 1 each 0   VENTOLIN HFA 108 (90 Base) MCG/ACT inhaler inhale 2 PUFFS into THE lungs EVERY 6 HOURS AS NEEDED FOR WHEEZING OR SHORTNESS OF BREATH (Patient not taking: Reported on 04/29/2022) 18 g 11   vortioxetine HBr (TRINTELLIX) 5 MG TABS tablet Take 1 tablet (5 mg total) by mouth daily. 30 tablet 0   No current facility-administered medications on file prior to visit.    ALLERGIES: Allergies  Allergen Reactions   Gadolinium Derivatives Nausea And Vomiting    Nausea and vomiting was followed by epileptic seizure episode that lasted approximately 5 minutes; pt was unable to verbally communicate during that time; she then came to and was able to speak; was evaluated by Rad and RN; kms    Grapefruit Flavor [Flavoring Agent] Other (See Comments)    Drug interaction    FAMILY HISTORY: Family  History  Problem Relation Age of Onset   Stroke Mother    Anxiety disorder Mother    Drug abuse Mother    Alcohol abuse Mother    Depression Mother    Drug abuse Father    Anxiety disorder Father    Heart disease Maternal Grandmother    Colon cancer Neg Hx    Esophageal cancer Neg Hx    Pancreatic cancer Neg Hx    Stomach cancer Neg Hx    Liver disease Neg Hx    Breast cancer Neg Hx       Objective:  Blood pressure (!) 144/68, pulse 91, height 5\' 5"  (1.651 m), weight (!) 328 lb (148.8 kg), last menstrual period 05/29/2017, SpO2 92 %. General: No acute distress.  Patient appears well-groomed.   Head:  Normocephalic/atraumatic Eyes:  Fundi examined but not visualized Neck: supple, no paraspinal tenderness, full range of motion Heart:  Regular rate and rhythm Lungs:  Clear to auscultation bilaterally Back: No paraspinal tenderness Neurological Exam: alert and oriented.  Speech fluent and not dysarthric, language  intact.  CN II-XII intact. Bulk and tone normal, muscle strength 5/5 throughout.  Sensation to light touch intact.  Deep tendon reflexes 1+ throughout.  Finger to nose testing intact.  Gait normal, Romberg negative.   [name], DO

## 2022-06-24 ENCOUNTER — Other Ambulatory Visit (INDEPENDENT_AMBULATORY_CARE_PROVIDER_SITE_OTHER): Payer: Medicaid Other

## 2022-06-24 ENCOUNTER — Ambulatory Visit (INDEPENDENT_AMBULATORY_CARE_PROVIDER_SITE_OTHER): Payer: Medicaid Other | Admitting: Neurology

## 2022-06-24 ENCOUNTER — Encounter: Payer: Self-pay | Admitting: Neurology

## 2022-06-24 ENCOUNTER — Telehealth: Payer: Self-pay | Admitting: Neurology

## 2022-06-24 VITALS — BP 144/68 | HR 91 | Ht 65.0 in | Wt 328.0 lb

## 2022-06-24 DIAGNOSIS — M792 Neuralgia and neuritis, unspecified: Secondary | ICD-10-CM

## 2022-06-24 DIAGNOSIS — Z79899 Other long term (current) drug therapy: Secondary | ICD-10-CM

## 2022-06-24 DIAGNOSIS — G500 Trigeminal neuralgia: Secondary | ICD-10-CM

## 2022-06-24 DIAGNOSIS — F09 Unspecified mental disorder due to known physiological condition: Secondary | ICD-10-CM | POA: Diagnosis not present

## 2022-06-24 DIAGNOSIS — R519 Headache, unspecified: Secondary | ICD-10-CM

## 2022-06-24 LAB — CBC
HCT: 41.2 % (ref 36.0–46.0)
Hemoglobin: 13.3 g/dL (ref 12.0–15.0)
MCHC: 32.3 g/dL (ref 30.0–36.0)
MCV: 87.2 fl (ref 78.0–100.0)
Platelets: 398 10*3/uL (ref 150.0–400.0)
RBC: 4.73 Mil/uL (ref 3.87–5.11)
RDW: 15.9 % — ABNORMAL HIGH (ref 11.5–15.5)
WBC: 11.3 10*3/uL — ABNORMAL HIGH (ref 4.0–10.5)

## 2022-06-24 LAB — COMPREHENSIVE METABOLIC PANEL
ALT: 22 U/L (ref 0–35)
AST: 19 U/L (ref 0–37)
Albumin: 4.3 g/dL (ref 3.5–5.2)
Alkaline Phosphatase: 109 U/L (ref 39–117)
BUN: 7 mg/dL (ref 6–23)
CO2: 28 mEq/L (ref 19–32)
Calcium: 9.7 mg/dL (ref 8.4–10.5)
Chloride: 100 mEq/L (ref 96–112)
Creatinine, Ser: 0.82 mg/dL (ref 0.40–1.20)
GFR: 81.48 mL/min (ref >60.00)
Glucose, Bld: 102 mg/dL — ABNORMAL HIGH (ref 70–99)
Potassium: 4.1 mEq/L (ref 3.5–5.1)
Sodium: 139 mEq/L (ref 135–145)
Total Bilirubin: 0.3 mg/dL (ref 0.2–1.2)
Total Protein: 7.6 g/dL (ref 6.0–8.3)

## 2022-06-24 MED ORDER — DULOXETINE HCL 60 MG PO CSDR: 60.0000 mg | DELAYED_RELEASE_CAPSULE | Freq: Every day | ORAL | 5 refills | Status: DC

## 2022-06-24 NOTE — Telephone Encounter (Signed)
Please fix script per Pharmacy. They did not get the 60 mg Cymbalta. 30 mg was sent to the patient this morning. Advised patient to send that back just in case one Dr.Jaffe not here for me to say yes to keep a lower dose and two so she will not have to pay out pocket for the 60 mg later since the 30 mg will be charged to the insurance.

## 2022-06-24 NOTE — Telephone Encounter (Signed)
Left message with the after hour service on 06-24-22 1:02 pm   Caller states she is calling about a RX that was called in for her is not available was seeing if they can give her what is.   Calling drizalma sprinkles that were ordered. Wants to know if they can change that to Cymbalta 60 mg

## 2022-06-24 NOTE — Patient Instructions (Signed)
Increase duloxetine to 60mg  daily.  If no improvement in head/facial pain in 8 weeks, contact me and we will switch to a different medication Continue gabapentin 900mg  three times daily for now Check CBC and CMP Follow up with Dr. Milbert Coulter here for memory testing in October Follow up with me in 6 months.

## 2022-06-25 ENCOUNTER — Other Ambulatory Visit: Payer: Self-pay | Admitting: Neurology

## 2022-06-25 MED ORDER — DULOXETINE HCL 60 MG PO CPEP: 60.0000 mg | ORAL_CAPSULE | Freq: Every day | ORAL | 5 refills | Status: DC

## 2022-06-25 NOTE — Telephone Encounter (Signed)
Per Dr.Jaffe,  Duloxetine 60mg  daily has been sent to [facility]

## 2022-06-28 NOTE — Progress Notes (Unsigned)
Office Visit    Patient Name: Denise [name] Date of Encounter: 06/28/2022  Primary Care Provider:  [facility] Primary Cardiologist:  Christell Constant, MD Primary Electrophysiologist: None   Past Medical History    Past Medical History:  Diagnosis Date   Abnormal EKG    AD (atopic dermatitis)    Alcohol use 04/14/2015   Anxiety    Arthritis    Blood transfusion without reported diagnosis    Depression    Diabetes mellitus without complication (HCC)    Fibroid uterus 12/18/2014   Hyperlipemia    Hypertension    Lithium toxicity 09/25/2019   Memory changes    Overactive bladder    Pelvic pain in female 12/18/2014   Prediabetes    Right sided weakness    Seizures (HCC)    Stroke (HCC)    Substance abuse (HCC)    Syncope 02/07/2015   TIA (transient ischemic attack)    Tobacco use 04/14/2015   Past Surgical History:  Procedure Laterality Date   CESAREAN SECTION     x 2    Allergies  Allergies  Allergen Reactions   Gadolinium Derivatives Nausea And Vomiting    Nausea and vomiting was followed by epileptic seizure episode that lasted approximately 5 minutes; pt was unable to verbally communicate during that time; she then came to and was able to speak; was evaluated by Rad and RN; kms    Grapefruit Flavor [Flavoring Agent] Other (See Comments)    Drug interaction     History of Present Illness    Denise [name]  is a 52 year old female with a PMH of HTN, DM type II, morbid obesity, OSA (on CPAP), obesity hypertension syndrome, HLD, TIA/CVA 2017 at anterior left basal ganglia who presents today for 1 year follow-up.  Denise [name] was seen initially in 2018 by Delphina Cahill for evaluation of abnormal EKG.  She had a previous history of syncope in 2017 and underwent CT of the head that showed possible acute infarct in the anterior basal ganglia and MRI and MRA showed chronic left basal ganglia  hemorrhage due to uncontrolled HTN.  She was seen initially by Dr. Raynelle Jan on 06/12/2020 by referral of PCP.  She endorsed on and off again chest tightness in the sternum and DOE.  She underwent nuclear stress test that showed no evidence of CAD but elevated BP.  Patient's HCTZ was increased to 25 mg p.o. daily and diuretic regimen was also increased.  She was seen in follow-up 07/17/2021 and completed 2D echo that showed normal EF of 60 to 65% with no RWMA and normal valve function.  She continues to note chest pain and was consented for PET MPI but was not completed or ordered.  Since last being seen in the office patient reports***.  Patient denies chest pain, palpitations, dyspnea, PND, orthopnea, nausea, vomiting, dizziness, syncope, edema, weight gain, or early satiety.     ***Notes:  Home Medications    Current Outpatient Medications  Medication Sig Dispense Refill   amLODipine (NORVASC) 10 MG tablet Take 10 mg by mouth daily.     atorvastatin (LIPITOR) 20 MG tablet TAKE 1 TABLET BY MOUTH EVERY DAY 90 tablet 3   budesonide-formoterol (SYMBICORT) 160-4.5 MCG/ACT inhaler Inhale 2 puffs into the lungs in the morning and at bedtime. 10.2 g 11   DULoxetine (CYMBALTA) 60 MG capsule Take 1 capsule (60 mg total) by mouth daily. 30 capsule 5   empagliflozin (JARDIANCE)  10 MG TABS tablet Take 1 tablet (10 mg total) by mouth daily. 30 tablet 1   furosemide (LASIX) 80 MG tablet TAKE 1 TABLET BY MOUTH EVERY DAY 90 tablet 0   gabapentin (NEURONTIN) 300 MG capsule TAKE 3 CAPSULES BY MOUTH 3 TIMES DAILY 270 capsule 0   lisinopril (ZESTRIL) 20 MG tablet Take 20 mg by mouth 2 (two) times daily.     losartan (COZAAR) 25 MG tablet Take 1 tablet (25 mg total) by mouth daily. 30 tablet 1   meloxicam (MOBIC) 15 MG tablet Take 15 mg by mouth daily.     metFORMIN (GLUCOPHAGE) 500 MG tablet Take 500 mg by mouth 2 (two) times daily.  3   nicotine (NICODERM CQ - DOSED IN MG/24 HOURS) 14 mg/24hr patch Place 1  patch (14 mg total) onto the skin daily. 28 patch 0   Polyethylene Glycol 3350 (PEG 3350) 17 GM/SCOOP POWD Take 17 g by mouth daily as needed (constipation).     potassium chloride SA (KLOR-CON M) 20 MEQ tablet Take 1 tablet (20 mEq total) by mouth daily. 30 tablet 1   Spacer/Aero-Holding Chambers (AEROCHAMBER MV) inhaler Use as instructed 1 each 0   VENTOLIN HFA 108 (90 Base) MCG/ACT inhaler inhale 2 PUFFS into THE lungs EVERY 6 HOURS AS NEEDED FOR WHEEZING OR SHORTNESS OF BREATH 18 g 11   vortioxetine HBr (TRINTELLIX) 5 MG TABS tablet Take 1 tablet (5 mg total) by mouth daily. 30 tablet 0   No current facility-administered medications for this visit.     Review of Systems  Please see the history of present illness.    (+)*** (+)***  All other systems reviewed and are otherwise negative except as noted above.  Physical Exam    Wt Readings from Last 3 Encounters:  06/24/22 (!) 328 lb (148.8 kg)  01/22/22 (!) 347 lb (157.4 kg)  09/21/21 (!) 339 lb 6.4 oz (154 kg)   GN:FAOZH were no vitals filed for this visit.,There is no height or weight on file to calculate BMI.  Constitutional:      Appearance: Healthy appearance. Not in distress.  Neck:     Vascular: JVD normal.  Pulmonary:     Effort: Pulmonary effort is normal.     Breath sounds: No wheezing. No rales. Diminished in the bases Cardiovascular:     Normal rate. Regular rhythm. Normal S1. Normal S2.      Murmurs: There is no murmur.  Edema:    Peripheral edema absent.  Abdominal:     Palpations: Abdomen is soft non tender. There is no hepatomegaly.  Skin:    General: Skin is warm and dry.  Neurological:     General: No focal deficit present.     Mental Status: Alert and oriented to person, place and time.     Cranial Nerves: Cranial nerves are intact.  EKG/LABS/ Recent Cardiac Studies    ECG personally reviewed by me today - ***  Cardiac Studies & Procedures     STRESS TESTS  MYOCARDIAL PERFUSION IMAGING  07/22/2020  Narrative  Nuclear stress EF: 58%. The left ventricular ejection fraction is normal (55-65%).  There was no ST segment deviation noted during stress.  This is a low risk study. There is no evidence of ischemia and no evidence of previous infarction.  The study is normal.   ECHOCARDIOGRAM  ECHOCARDIOGRAM COMPLETE 07/17/2021  Narrative ECHOCARDIOGRAM REPORT    Patient Name:   Denise [name] Date of Exam: 07/17/2021 Medical Rec #:  [id number]               Height:       65.0 in Accession #:    [id number]              Weight:       324.8 lb Date of Birth:  [birth year]               BSA:          2.431 m Patient Age:    50 years                BP:           130/68 mmHg Patient Gender: F                       HR:           80 bpm. Exam Location:  [address]  Procedure: 2D Echo, 3D Echo, Color Doppler and Cardiac Doppler  Indications:    Murmur, Systolic R01.1  History:        Patient has prior history of Echocardiogram examinations, most recent 06/06/2020. Risk Factors:Hypertension, Dyslipidemia, Diabetes and Current Smoker.  Sonographer:    Thurman Coyer RDCS Referring Phys: [id number] [facility] CHANDRASEKHAR  IMPRESSIONS   1. Left ventricular ejection fraction, by estimation, is 60 to 65%. The left ventricle has normal function. The left ventricle has no regional wall motion abnormalities. Left ventricular diastolic parameters were normal. 2. Right ventricular systolic function is normal. The right ventricular size is normal. 3. The mitral valve is normal in structure. No evidence of mitral valve regurgitation. No evidence of mitral stenosis. 4. The aortic valve is normal in structure. Aortic valve regurgitation is not visualized. No aortic stenosis is present. 5. The inferior vena cava is normal in size with greater than 50% respiratory variability, suggesting right atrial pressure of 3 mmHg. 6. Technically difficult study with limited images due to  patients body habitus. No obvious valvular pathology.  FINDINGS Left Ventricle: Left ventricular ejection fraction, by estimation, is 60 to 65%. The left ventricle has normal function. The left ventricle has no regional wall motion abnormalities. The left ventricular internal cavity size was normal in size. There is no left ventricular hypertrophy. Left ventricular diastolic parameters were normal.  Right Ventricle: The right ventricular size is normal. No increase in right ventricular wall thickness. Right ventricular systolic function is normal.  Left Atrium: Left atrial size was normal in size.  Right Atrium: Right atrial size was normal in size.  Pericardium: There is no evidence of pericardial effusion.  Mitral Valve: The mitral valve is normal in structure. No evidence of mitral valve regurgitation. No evidence of mitral valve stenosis.  Tricuspid Valve: The tricuspid valve is normal in structure. Tricuspid valve regurgitation is not demonstrated. No evidence of tricuspid stenosis.  Aortic Valve: The aortic valve is normal in structure. Aortic valve regurgitation is not visualized. No aortic stenosis is present.  Pulmonic Valve: The pulmonic valve was normal in structure. Pulmonic valve regurgitation is not visualized. No evidence of pulmonic stenosis.  Aorta: The aortic root is normal in size and structure. Ascending aorta measurements are within normal limits for age when indexed to body surface area.  Venous: The inferior vena cava was not well visualized. The inferior vena cava is normal in size with greater than 50% respiratory variability, suggesting right atrial pressure of 3 mmHg.  IAS/Shunts: No atrial level shunt detected by color flow  Doppler.   LEFT VENTRICLE PLAX 2D LVIDd:         4.80 cm   Diastology LVIDs:         2.90 cm   LV e' medial:    10.70 cm/s LV PW:         1.00 cm   LV E/e' medial:  8.3 LV IVS:        1.00 cm   LV e' lateral:   10.70 cm/s LVOT diam:      2.10 cm   LV E/e' lateral: 8.3 LV SV:         77 LV SV Index:   32 LVOT Area:     3.46 cm  3D Volume EF: 3D EF:        61 % LV EDV:       148 ml LV ESV:       58 ml LV SV:        90 ml  RIGHT VENTRICLE RV Basal diam:  3.80 cm RV Mid diam:    3.30 cm RV S prime:     11.10 cm/s  LEFT ATRIUM             Index        RIGHT ATRIUM           Index LA diam:        3.70 cm 1.52 cm/m   RA Area:     12.70 cm LA Vol (A2C):   32.7 ml 13.45 ml/m  RA Volume:   25.90 ml  10.65 ml/m LA Vol (A4C):   27.1 ml 11.15 ml/m LA Biplane Vol: 31.2 ml 12.84 ml/m AORTIC VALVE LVOT Vmax:   111.00 cm/s LVOT Vmean:  72.100 cm/s LVOT VTI:    0.222 m  AORTA Ao Root diam: 2.70 cm Ao Asc diam:  3.70 cm  MITRAL VALVE MV Area (PHT): 3.83 cm    SHUNTS MV Decel Time: 198 msec    Systemic VTI:  0.22 m MV E velocity: 88.90 cm/s  Systemic Diam: 2.10 cm MV A velocity: 94.30 cm/s MV E/A ratio:  0.94  Arvilla Meres MD Electronically signed by Arvilla Meres MD Signature Date/Time: 07/17/2021/7:06:35 PM    Final             Risk Assessment/Calculations:   {Does this patient have ATRIAL FIBRILLATION?:[phone]}        Lab Results  Component Value Date   WBC 11.3 (H) 06/24/2022   HGB 13.3 06/24/2022   HCT 41.2 06/24/2022   MCV 87.2 06/24/2022   PLT 398.0 06/24/2022   Lab Results  Component Value Date   CREATININE 0.82 06/24/2022   BUN 7 06/24/2022   NA 139 06/24/2022   K 4.1 06/24/2022   CL 100 06/24/2022   CO2 28 06/24/2022   Lab Results  Component Value Date   ALT 22 06/24/2022   AST 19 06/24/2022   ALKPHOS 109 06/24/2022   BILITOT 0.3 06/24/2022   Lab Results  Component Value Date   CHOL 150 02/24/2021   HDL 49 02/24/2021   LDLCALC 84 02/24/2021   TRIG 91 02/24/2021   CHOLHDL 3.1 02/24/2021    Lab Results  Component Value Date   HGBA1C 6.5 (H) 07/29/2021     Assessment & Plan    1.  HFpEF:  2.  Chest pain:  3.  Essential hypertension:  4.   Hyperlipidemia:  5.  Tobacco abuse:  6.  DM type II:  Disposition: Follow-up with Christell Constant, MD or APP in *** months {Are you ordering a CV Procedure (e.g. stress test, cath, DCCV, TEE, etc)?   Press F2        :[id number]}   Medication Adjustments/Labs and Tests Ordered: Current medicines are reviewed at length with the patient today.  Concerns regarding medicines are outlined above.   Signed, Napoleon Form, Leodis Rains, NP 06/28/2022, 12:54 PM [facility]

## 2022-06-29 ENCOUNTER — Ambulatory Visit: Payer: Medicaid Other | Attending: Nurse Practitioner | Admitting: Nurse Practitioner

## 2022-06-29 ENCOUNTER — Encounter: Payer: Self-pay | Admitting: Nurse Practitioner

## 2022-06-29 VITALS — BP 138/64 | HR 91 | Ht 65.0 in | Wt 330.8 lb

## 2022-06-29 DIAGNOSIS — E785 Hyperlipidemia, unspecified: Secondary | ICD-10-CM | POA: Insufficient documentation

## 2022-06-29 DIAGNOSIS — I10 Essential (primary) hypertension: Secondary | ICD-10-CM | POA: Diagnosis present

## 2022-06-29 DIAGNOSIS — I5032 Chronic diastolic (congestive) heart failure: Secondary | ICD-10-CM | POA: Insufficient documentation

## 2022-06-29 DIAGNOSIS — E1169 Type 2 diabetes mellitus with other specified complication: Secondary | ICD-10-CM | POA: Insufficient documentation

## 2022-06-29 DIAGNOSIS — R079 Chest pain, unspecified: Secondary | ICD-10-CM

## 2022-06-29 DIAGNOSIS — Z7984 Long term (current) use of oral hypoglycemic drugs: Secondary | ICD-10-CM

## 2022-06-29 DIAGNOSIS — Z720 Tobacco use: Secondary | ICD-10-CM

## 2022-06-29 NOTE — Patient Instructions (Addendum)
Medication Instructions:  Your physician recommends that you continue on your current medications as directed. Please refer to the Current Medication list given to you today. *If you need a refill on your cardiac medications before your next appointment, please call your pharmacy*   Lab Work: None ordered   Testing/Procedures: None ordered   Follow-Up: At [facility], you and your health needs are our priority.  As part of our continuing mission to provide you with exceptional heart care, we have created designated Provider Care Teams.  These Care Teams include your primary Cardiologist (physician) and Advanced Practice Providers (APPs -  Physician Assistants and Nurse Practitioners) who all work together to provide you with the care you need, when you need it.  We recommend signing up for the patient portal called "MyChart".  Sign up information is provided on this After Visit Summary.  MyChart is used to connect with patients for Virtual Visits (Telemedicine).  Patients are able to view lab/test results, encounter notes, upcoming appointments, etc.  Non-urgent messages can be sent to your provider as well.   To learn more about what you can do with MyChart, go to [email or web address].    Your next appointment:   12 month(s)  Provider:   Christell Constant, MD     Other Instructions  Follow up with Dr Craige Cotta

## 2022-07-09 ENCOUNTER — Other Ambulatory Visit: Payer: Self-pay | Admitting: Neurology

## 2022-07-12 NOTE — Telephone Encounter (Signed)
Pt is calling about her refill of the Gabapentin  she would like that sent in

## 2022-07-13 ENCOUNTER — Other Ambulatory Visit: Payer: Self-pay | Admitting: Internal Medicine

## 2022-07-15 ENCOUNTER — Encounter (HOSPITAL_COMMUNITY): Payer: Self-pay | Admitting: Psychiatry

## 2022-07-15 ENCOUNTER — Other Ambulatory Visit: Payer: Self-pay

## 2022-07-15 ENCOUNTER — Ambulatory Visit (HOSPITAL_BASED_OUTPATIENT_CLINIC_OR_DEPARTMENT_OTHER): Payer: Medicaid Other | Admitting: Psychiatry

## 2022-07-15 VITALS — Ht 65.0 in | Wt 318.0 lb

## 2022-07-15 DIAGNOSIS — G4701 Insomnia due to medical condition: Secondary | ICD-10-CM

## 2022-07-15 DIAGNOSIS — F333 Major depressive disorder, recurrent, severe with psychotic symptoms: Secondary | ICD-10-CM | POA: Diagnosis not present

## 2022-07-15 DIAGNOSIS — F4312 Post-traumatic stress disorder, chronic: Secondary | ICD-10-CM

## 2022-07-15 DIAGNOSIS — F411 Generalized anxiety disorder: Secondary | ICD-10-CM | POA: Diagnosis not present

## 2022-07-15 MED ORDER — VORTIOXETINE HBR 10 MG PO TABS: 10.0000 mg | ORAL_TABLET | Freq: Every day | ORAL | 0 refills | Status: DC

## 2022-07-15 NOTE — Progress Notes (Signed)
BH MD/PA/NP OP Progress Note  07/15/2022 10:07 AM Denise [name]  MRN:  [id number]  Chief Complaint:  Chief Complaint  Patient presents with   Follow-up   HPI: "I'm good. I'm blessed". Denise [name]'s shares her depression is "so/so". She is having daily flashbacks but is not sure if she is having nightmares. Her sleep is poor and it doesn't feel like her sleep is restful. She went to her PCP recently and her Gabapentin was mistakenly filled for 1 tab TID instead of 3 tabs TID. She called her PCP this morning and is hoping it will be fixed today. Denise [name] feels like her stress tolerance is very low. She is always overwhelmed and can't handle things. She feels tired and drained. Her depression is constant. She is endorsing anhedonia. Her thoughts are racing. Denise [name] feels she can't think clearly and doesn't know what symptoms she is having. Her memory and concentration are poor. She doesn't like going out. She spends most of her time in bed, sometimes watching tv and sometimes trying to rest. Denise [name] denies SI/HI. Her appetite is poor. She is endorsing psychomotor retardation.   Visit Diagnosis:    ICD-10-CM   1. Severe episode of recurrent major depressive disorder, with psychotic features (HCC)  F33.3 vortioxetine HBr (TRINTELLIX) 10 MG TABS tablet    2. GAD (generalized anxiety disorder)  F41.1     3. Insomnia due to medical condition  G47.01     4. Chronic post-traumatic stress disorder (PTSD)  F43.12       Past Psychiatric History: no updates per patient, see H&P  Past Medical History:  Past Medical History:  Diagnosis Date   Abnormal EKG    AD (atopic dermatitis)    Alcohol use 04/14/2015   Anxiety    Arthritis    Blood transfusion without reported diagnosis    Depression    Diabetes mellitus without complication (HCC)    Fibroid uterus 12/18/2014   Hyperlipemia    Hypertension    Lithium toxicity 09/25/2019   Memory changes    Overactive bladder    Pelvic pain in  female 12/18/2014   Prediabetes    Right sided weakness    Seizures (HCC)    Stroke (HCC)    Substance abuse (HCC)    Syncope 02/07/2015   TIA (transient ischemic attack)    Tobacco use 04/14/2015    Past Surgical History:  Procedure Laterality Date   CESAREAN SECTION     x 2    Family Psychiatric and Medical History:  Family History  Problem Relation Age of Onset   Stroke Mother    Anxiety disorder Mother    Drug abuse Mother    Alcohol abuse Mother    Depression Mother    Drug abuse Father    Anxiety disorder Father    Heart disease Maternal Grandmother    Colon cancer Neg Hx    Esophageal cancer Neg Hx    Pancreatic cancer Neg Hx    Stomach cancer Neg Hx    Liver disease Neg Hx    Breast cancer Neg Hx     Social History:  Social History   Socioeconomic History   Marital status: Married    Spouse name: [name]   Number of children: 3   Years of education: 6   Highest education level: 6th grade  Occupational History   Not on file  Tobacco Use   Smoking status: Every Day    Packs/day: .4    Types: Cigarettes  Smokeless tobacco: Never   Tobacco comments:    Smoking 1 pack of cigarettes a week. 09/21/21 Tay  Vaping Use   Vaping Use: Never used  Substance and Sexual Activity   Alcohol use: Not Currently    Alcohol/week: 2.0 standard drinks of alcohol    Types: 2 Standard drinks or equivalent per week   Drug use: Not Currently    Types: "Crack" cocaine   Sexual activity: Yes    Partners: Male    Birth control/protection: None  Other Topics Concern   Not on file  Social History Narrative   Not on file   Social Determinants of Health   Financial Resource Strain: High Risk (09/10/2017)   Overall Financial Resource Strain (CARDIA)    Difficulty of Paying Living Expenses: Very hard  Food Insecurity: Food Insecurity Present (09/10/2017)   Hunger Vital Sign    Worried About Running Out of Food in the Last Year: Often true    Ran Out of Food in  the Last Year: Often true  Transportation Needs: Unmet Transportation Needs (09/10/2017)   PRAPARE - Transportation    Lack of Transportation (Medical): Yes    Lack of Transportation (Non-Medical): Yes  Physical Activity: Inactive (09/10/2017)   Exercise Vital Sign    Days of Exercise per Week: 0 days    Minutes of Exercise per Session: 0 min  Stress: Stress Concern Present (09/10/2017)   [employer] of Occupational Health - Occupational Stress Questionnaire    Feeling of Stress : Very much  Social Connections: Unknown (09/10/2017)   Social Connection and Isolation Panel [NHANES]    Frequency of Communication with Friends and Family: Not on file    Frequency of Social Gatherings with Friends and Family: Not on file    Attends Religious Services: Never    Active Member of Clubs or Organizations: No    Attends Banker Meetings: Never    Marital Status: Married    Allergies:  Allergies  Allergen Reactions   Gadolinium Derivatives Nausea And Vomiting    Nausea and vomiting was followed by epileptic seizure episode that lasted approximately 5 minutes; pt was unable to verbally communicate during that time; she then came to and was able to speak; was evaluated by Rad and RN; kms    Grapefruit Flavor [Flavoring Agent] Other (See Comments)    Drug interaction    Metabolic Disorder Labs: Lab Results  Component Value Date   HGBA1C 6.5 (H) 07/29/2021   MPG 139.85 07/29/2021   MPG 108.28 09/25/2019   Lab Results  Component Value Date   PROLACTIN 8.1 10/30/2018   Lab Results  Component Value Date   CHOL 150 02/24/2021   TRIG 91 02/24/2021   HDL 49 02/24/2021   CHOLHDL 3.1 02/24/2021   VLDL 70 (H) 02/08/2015   LDLCALC 84 02/24/2021   LDLCALC 114 (H) 11/17/2020   Lab Results  Component Value Date   TSH 1.460 11/17/2020   TSH 5.147 (H) 09/25/2019    Therapeutic Level Labs: Lab Results  Component Value Date   LITHIUM 1.00 09/27/2019   LITHIUM 1.35 (H)  09/26/2019   No results found for: "VALPROATE" No results found for: "CBMZ"  Current Medications: Current Outpatient Medications  Medication Sig Dispense Refill   amLODipine (NORVASC) 10 MG tablet Take 10 mg by mouth daily.     atorvastatin (LIPITOR) 20 MG tablet TAKE 1 TABLET BY MOUTH EVERY DAY 90 tablet 3   budesonide-formoterol (SYMBICORT) 160-4.5 MCG/ACT inhaler Inhale 2 puffs  into the lungs in the morning and at bedtime. 10.2 g 11   empagliflozin (JARDIANCE) 10 MG TABS tablet Take 1 tablet (10 mg total) by mouth daily. 30 tablet 1   furosemide (LASIX) 80 MG tablet TAKE 1 TABLET BY MOUTH EVERY DAY 90 tablet 0   gabapentin (NEURONTIN) 300 MG capsule TAKE 3 CAPSULES BY MOUTH 3 TIMES DAILY 270 capsule 0   lisinopril (ZESTRIL) 20 MG tablet Take 20 mg by mouth 2 (two) times daily.     losartan (COZAAR) 25 MG tablet Take 1 tablet (25 mg total) by mouth daily. 30 tablet 1   meloxicam (MOBIC) 15 MG tablet Take 15 mg by mouth daily.     metFORMIN (GLUCOPHAGE) 500 MG tablet Take 500 mg by mouth 2 (two) times daily.  3   nicotine (NICODERM CQ - DOSED IN MG/24 HOURS) 14 mg/24hr patch Place 1 patch (14 mg total) onto the skin daily. 28 patch 0   Polyethylene Glycol 3350 (PEG 3350) 17 GM/SCOOP POWD Take 17 g by mouth daily as needed (constipation).     potassium chloride SA (KLOR-CON M) 20 MEQ tablet Take 1 tablet (20 mEq total) by mouth daily. 30 tablet 1   Spacer/Aero-Holding Chambers (AEROCHAMBER MV) inhaler Use as instructed 1 each 0   VENTOLIN HFA 108 (90 Base) MCG/ACT inhaler inhale 2 PUFFS into THE lungs EVERY 6 HOURS AS NEEDED FOR WHEEZING OR SHORTNESS OF BREATH 18 g 11   vortioxetine HBr (TRINTELLIX) 10 MG TABS tablet Take 1 tablet (10 mg total) by mouth daily. 90 tablet 0   No current facility-administered medications for this visit.     Musculoskeletal: Strength & Muscle Tone: decreased and using walker Gait & Station: broad based Patient leans: Front  Psychiatric Specialty  Exam: Review of Systems  Height 5\' 5"  (1.651 m), weight (!) 318 lb (144.2 kg), last menstrual period 05/29/2017.Body mass index is 52.92 kg/m.  General Appearance: Casual  Eye Contact:  Good  Speech:  Clear and Coherent and Normal Rate  Volume:  Normal  Mood:  Anxious and Depressed  Affect:  Congruent  Thought Process:  slow, concrete Coherent and Descriptions of Associations: Intact  Orientation:  Full (Time, Place, and Person)  Thought Content: Logical   Suicidal Thoughts:  No  Homicidal Thoughts:  No  Memory:  Immediate;   Poor  Judgement:  Poor  Insight:  Shallow  Psychomotor Activity:  Wide Base  Concentration:  Concentration: Poor  Recall:  Poor  Fund of Knowledge: Fair  Language: Fair  Akathisia:  No  Handed:  Right  AIMS (if indicated): not done  Assets:  Communication Skills Desire for Improvement Financial Resources/Insurance Housing Vocational/Educational  ADL's:  Intact  Cognition: WNL  Sleep:  Poor   Screenings: GAD-7    Flowsheet Row [facility]  Total GAD-7 Score 5      Mini-Mental    Flowsheet Row Office Visit from 01/22/2022 in [facility] Office Visit from 04/14/2015 in [facility]  Total Score (max 30 points ) 20 15      PHQ2-9    Flowsheet Row [facility] Visit from 07/[facility]/2022 in [facility] Office Visit from 04/29/2022 in [facility] Video Visit from 11[facility]26/2021 in [facility] Video Visit from 09[facility]17/2021 in [facility] Video Visit from 09/03/2021 in [facility]  PHQ-2 Total Score 6 6 5  3 5  PHQ-9 Total Score 24 23 21 17 19       Flowsheet Row Office Visit from 07/15/2022 in [facility] Office Visit from 04/29/2022 in  [facility] Video Visit from 11/26/2021 in [facility]  C-SSRS RISK CATEGORY No Risk No Risk No Risk        Assessment and Plan: Confidentiality and exclusions reviewed with pt who verbalized understanding.   Medication management with supportive therapy. Risks and benefits, side effects and alternative treatment options discussed with patient. Pt was given an opportunity to ask questions about medication, illness, and treatment. All current psychiatric medications have been reviewed and discussed with the patient and adjusted as clinically appropriate. The patient has been provided an accurate and updated list of the medications being now prescribed. Pt verbalized understanding and verbal consent obtained for treatment.  Pt is aware that these meds carry a teratogenic risk. Pt will discuss plan of action if she does or plans to become pregnant in the future.  Status of current problems: ongoing depression, anxiety, insomnia and PTSD symptoms  I Reviewed recent neurology visit notes (07/23/21) 1  Scalp neuralgia 2  Major neurocognitive disorder, secondary to cerebrovascular disease as well as possibly component of anxiety and pharmacologic effect.  Based on her age and lack of family history, an underlying neurodegenerative disease such as early-onset Alzheimer's is unlikely  Meds: increase Trintellix 10mg  po every day D/C Cymbalta as prescribed by her PCP. It was not effective in the past.    Labs: none today  Therapy: brief supportive therapy provided. Discussed psychosocial stressors in detail.        F/up in 1-2 months or sooner if needed with a new psychiatrist as I am leaving [facility] in July 2024  The duration of this appointment visit was 20 minutes of face-to-face time with the patient.  Greater than 50% of this time was spent in counseling, explanation of  diagnosis, planning of further management,  and coordination of care    Collaboration of Care: Collaboration of Care: Other encouraged to follow up with her neurologist  Patient/Guardian was advised Release of Information must be obtained prior to any record release in order to collaborate their care with an outside provider. Patient/Guardian was advised if they have not already done so to contact the registration department to sign all necessary forms in order for [place] to release information regarding their care.   Consent: Patient/Guardian gives verbal consent for treatment and assignment of benefits for services provided during this visit. Patient/Guardian expressed understanding and agreed to proceed.    Oletta Darter, MD 07/15/2022, 10:07 AM

## 2022-07-26 ENCOUNTER — Telehealth (HOSPITAL_COMMUNITY): Payer: Self-pay

## 2022-07-26 NOTE — Telephone Encounter (Signed)
I received a fax from patients pharmacy requesting a prior auth for patients Trintellix 10 mg. I called patients insurance, Medicaid, and was informed she now has [employer]. I spoke with Alexia Freestone, interaction ID# [id number], and faxed the form back to the pharmacy with the request to run through [facility].

## 2022-09-10 ENCOUNTER — Telehealth: Payer: Self-pay | Admitting: Pulmonary Disease

## 2022-09-10 ENCOUNTER — Other Ambulatory Visit (HOSPITAL_COMMUNITY): Payer: Self-pay | Admitting: Psychiatry

## 2022-09-10 DIAGNOSIS — F333 Major depressive disorder, recurrent, severe with psychotic symptoms: Secondary | ICD-10-CM

## 2022-09-10 DIAGNOSIS — F411 Generalized anxiety disorder: Secondary | ICD-10-CM

## 2022-09-10 NOTE — Telephone Encounter (Signed)
PT states she went to get O2 that Dr. Craige Cotta ordered for her and it's too heavy for her. The Supplier said she needed to call Dr. Craige Cotta if she wanted a smaller one. Please call to advise @ [phone]   AVS States: Will have [employer] set you up to use 3 liters oxygen during the day. You should continue to use 2 liters oxygen at night with CPAP.

## 2022-09-16 NOTE — Telephone Encounter (Signed)
Spoke with patient.  She needs a qualifying walk.  Please schedule.

## 2022-09-20 ENCOUNTER — Telehealth: Payer: Self-pay | Admitting: Neurology

## 2022-09-20 ENCOUNTER — Other Ambulatory Visit: Payer: Self-pay | Admitting: Neurology

## 2022-09-20 MED ORDER — GABAPENTIN 300 MG PO CAPS: ORAL_CAPSULE | ORAL | 1 refills | Status: DC

## 2022-09-20 NOTE — Telephone Encounter (Signed)
Patient needs a refill on her gabapentin 300mg   [facility]

## 2022-09-20 NOTE — Telephone Encounter (Signed)
Patient needs refill on Gabapentin 300mg  [facility]

## 2022-10-11 ENCOUNTER — Ambulatory Visit (HOSPITAL_COMMUNITY): Payer: Medicaid Other | Admitting: Student

## 2022-10-18 ENCOUNTER — Ambulatory Visit (HOSPITAL_COMMUNITY): Payer: Medicaid Other | Admitting: Student

## 2022-10-18 ENCOUNTER — Other Ambulatory Visit: Payer: Self-pay | Admitting: Neurology

## 2022-10-20 DIAGNOSIS — H2513 Age-related nuclear cataract, bilateral: Secondary | ICD-10-CM

## 2022-10-20 DIAGNOSIS — H40053 Ocular hypertension, bilateral: Secondary | ICD-10-CM

## 2022-10-20 HISTORY — DX: Ocular hypertension, bilateral: H40.053

## 2022-10-20 HISTORY — DX: Age-related nuclear cataract, bilateral: H25.13

## 2022-10-21 ENCOUNTER — Ambulatory Visit: Payer: Medicaid Other | Admitting: Nurse Practitioner

## 2022-10-24 ENCOUNTER — Emergency Department (HOSPITAL_COMMUNITY): Payer: MEDICAID

## 2022-10-24 ENCOUNTER — Emergency Department (HOSPITAL_COMMUNITY)
Admission: EM | Admit: 2022-10-24 | Discharge: 2022-10-24 | Disposition: A | Discharge status: Home / Self Care | Payer: MEDICAID | Attending: Emergency Medicine | Admitting: Emergency Medicine

## 2022-10-24 ENCOUNTER — Other Ambulatory Visit: Payer: Self-pay

## 2022-10-24 DIAGNOSIS — Z1152 Encounter for screening for COVID-19: Secondary | ICD-10-CM | POA: Insufficient documentation

## 2022-10-24 DIAGNOSIS — F17200 Nicotine dependence, unspecified, uncomplicated: Secondary | ICD-10-CM | POA: Diagnosis not present

## 2022-10-24 DIAGNOSIS — I10 Essential (primary) hypertension: Secondary | ICD-10-CM | POA: Diagnosis not present

## 2022-10-24 DIAGNOSIS — E119 Type 2 diabetes mellitus without complications: Secondary | ICD-10-CM | POA: Insufficient documentation

## 2022-10-24 DIAGNOSIS — R6889 Other general symptoms and signs: Secondary | ICD-10-CM | POA: Diagnosis present

## 2022-10-24 DIAGNOSIS — B370 Candidal stomatitis: Secondary | ICD-10-CM | POA: Insufficient documentation

## 2022-10-24 LAB — CBC WITH DIFFERENTIAL/PLATELET
Abs Immature Granulocytes: 0.04 10*3/uL (ref 0.00–0.07)
Basophils Absolute: 0 10*3/uL (ref 0.0–0.1)
Basophils Relative: 0 %
Eosinophils Absolute: 0.1 10*3/uL (ref 0.0–0.5)
Eosinophils Relative: 1 %
HCT: 44.9 % (ref 36.0–46.0)
Hemoglobin: 14.5 g/dL (ref 12.0–15.0)
Immature Granulocytes: 0 %
Lymphocytes Relative: 31 %
Lymphs Abs: 3.5 10*3/uL (ref 0.7–4.0)
MCH: 28.4 pg (ref 26.0–34.0)
MCHC: 32.3 g/dL (ref 30.0–36.0)
MCV: 88 fL (ref 80.0–100.0)
Monocytes Absolute: 0.8 10*3/uL (ref 0.1–1.0)
Monocytes Relative: 7 %
Neutro Abs: 6.6 10*3/uL (ref 1.7–7.7)
Neutrophils Relative %: 61 %
Platelets: 443 10*3/uL — ABNORMAL HIGH (ref 150–400)
RBC: 5.1 MIL/uL (ref 3.87–5.11)
RDW: 14.7 % (ref 11.5–15.5)
WBC: 11 10*3/uL — ABNORMAL HIGH (ref 4.0–10.5)
nRBC: 0 % (ref 0.0–0.2)

## 2022-10-24 LAB — RAPID HIV SCREEN (HIV 1/2 AB+AG)
HIV 1/2 Antibodies: NONREACTIVE
HIV-1 P24 Antigen - HIV24: NONREACTIVE

## 2022-10-24 LAB — COMPREHENSIVE METABOLIC PANEL
ALT: 36 U/L (ref 0–44)
AST: 26 U/L (ref 15–41)
Albumin: 4 g/dL (ref 3.5–5.0)
Alkaline Phosphatase: 98 U/L (ref 38–126)
Anion gap: 10 (ref 5–15)
BUN: 6 mg/dL (ref 6–20)
CO2: 28 mmol/L (ref 22–32)
Calcium: 9.2 mg/dL (ref 8.9–10.3)
Chloride: 100 mmol/L (ref 98–111)
Creatinine, Ser: 0.54 mg/dL (ref 0.44–1.00)
GFR, Estimated: >60 mL/min (ref >60)
Glucose, Bld: 97 mg/dL (ref 70–99)
Potassium: 3.4 mmol/L — ABNORMAL LOW (ref 3.5–5.1)
Sodium: 138 mmol/L (ref 135–145)
Total Bilirubin: 0.6 mg/dL (ref 0.3–1.2)
Total Protein: 7.7 g/dL (ref 6.5–8.1)

## 2022-10-24 LAB — URINALYSIS, ROUTINE W REFLEX MICROSCOPIC
Bilirubin Urine: NEGATIVE
Glucose, UA: NEGATIVE mg/dL
Hgb urine dipstick: NEGATIVE
Ketones, ur: NEGATIVE mg/dL
Leukocytes,Ua: NEGATIVE
Nitrite: NEGATIVE
Protein, ur: NEGATIVE mg/dL
Specific Gravity, Urine: 1.011 (ref 1.005–1.030)
pH: 6 (ref 5.0–8.0)

## 2022-10-24 LAB — LIPASE, BLOOD: Lipase: 28 U/L (ref 11–51)

## 2022-10-24 LAB — SARS CORONAVIRUS 2 BY RT PCR: SARS Coronavirus 2 by RT PCR: NEGATIVE

## 2022-10-24 LAB — BRAIN NATRIURETIC PEPTIDE: B Natriuretic Peptide: 19.8 pg/mL (ref 0.0–100.0)

## 2022-10-24 LAB — TROPONIN I (HIGH SENSITIVITY)
Troponin I (High Sensitivity): 4 ng/L (ref <18)
Troponin I (High Sensitivity): 5 ng/L (ref <18)

## 2022-10-24 MED ORDER — IOHEXOL 300 MG/ML  SOLN
100.0000 mL | Freq: Once | INTRAMUSCULAR | Status: AC | PRN
  Administered 2022-10-24: 100 mL via INTRAVENOUS

## 2022-10-24 MED ORDER — BENZONATATE 100 MG PO CAPS: 100.0000 mg | ORAL_CAPSULE | Freq: Three times a day (TID) | ORAL | 0 refills | Status: DC

## 2022-10-24 MED ORDER — DICYCLOMINE HCL 20 MG PO TABS: 20.0000 mg | ORAL_TABLET | Freq: Three times a day (TID) | ORAL | 0 refills | Status: DC | PRN

## 2022-10-24 MED ORDER — SODIUM CHLORIDE 0.9 % IV BOLUS
500.0000 mL | Freq: Once | INTRAVENOUS | Status: AC
  Administered 2022-10-24: 500 mL via INTRAVENOUS

## 2022-10-24 MED ORDER — NYSTATIN 100000 UNIT/ML MT SUSP
500000.0000 [IU] | Freq: Four times a day (QID) | OROMUCOSAL | 0 refills | Status: AC
Start: 2022-10-24 — End: 2022-11-03

## 2022-10-24 MED ORDER — ONDANSETRON 4 MG PO TBDP: 4.0000 mg | ORAL_TABLET | Freq: Three times a day (TID) | ORAL | 0 refills | Status: DC | PRN

## 2022-10-24 NOTE — ED Triage Notes (Signed)
PT BIBA from hotel with flu-like symptoms x10days-2 weeks. Sore throat, body aches, N/V/D, productive cough. 2L at baseline.   132/80 CBG 112

## 2022-10-24 NOTE — Discharge Instructions (Signed)

## 2022-10-24 NOTE — ED Provider Notes (Signed)
Emergency Department Provider Note   I have reviewed the triage vital signs and the nursing notes.   HISTORY  Chief Complaint Flu-like symptoms   HPI Denise [name] is a 52 y.o. female with past history reviewed below including diabetes presents to the emergency department with mostly flulike symptoms for the past 10 days.  She has sore throat with bodyaches and productive cough with white sputum.  No hemoptysis.  She tells me that she has also developed some lower abdominal and back pain along with vomiting and diarrhea.  No blood in the emesis or stool.  He is unsure regarding fever but has had cold sweats at times.  Denies chest pain or specific shortness of breath.  Also notes a headache and just feeling generally unwell.    Past Medical History:  Diagnosis Date   Abnormal EKG    AD (atopic dermatitis)    Alcohol use 04/14/2015   Anxiety    Arthritis    Blood transfusion without reported diagnosis    Depression    Diabetes mellitus without complication (HCC)    Fibroid uterus 12/18/2014   Hyperlipemia    Hypertension    Lithium toxicity 09/25/2019   Memory changes    Overactive bladder    Pelvic pain in female 12/18/2014   Prediabetes    Right sided weakness    Seizures (HCC)    Stroke [facility])    Substance abuse (HCC)    Syncope 02/07/2015   TIA (transient ischemic attack)    Tobacco use 04/14/2015    Review of Systems  Constitutional: Subjective fever/chills. ENT: Positive sore throat and congestion.  Cardiovascular: Denies chest pain. Respiratory: Denies shortness of breath. Positive cough.  Gastrointestinal: Positive abdominal pain. Positive nausea, vomiting, and diarrhea.  Musculoskeletal: Negative for back pain. Skin: Negative for rash. Neurological: Negative for focal weakness or numbness. Positive HA.    ____________________________________________   PHYSICAL EXAM:  VITAL SIGNS: ED Triage Vitals [10/24/22 0845]  Encounter Vitals Group      BP 135/86     Pulse Rate 83     Resp 18     Temp 97.9 F (36.6 C)     Temp Source Oral     SpO2 100 %   Constitutional: Alert and oriented. Well appearing and in no acute distress. Eyes: Conjunctivae are normal.  Head: Atraumatic. Nose: No congestion/rhinnorhea. Mouth/Throat: Mucous membranes are moist. Thrush noted to the tongue. No PTA. Clear voice. Managing oral secretions.  Neck: No stridor.   Cardiovascular: Normal rate, regular rhythm. Good peripheral circulation. Grossly normal heart sounds.   Respiratory: Normal respiratory effort.  No retractions. Lungs CTAB. Gastrointestinal: Soft and nontender. No distention.  Musculoskeletal: No lower extremity tenderness nor edema. No gross deformities of extremities. Neurologic:  Normal speech and language. No gross focal neurologic deficits are appreciated.  Skin:  Skin is warm, dry and intact. No rash noted.   ____________________________________________   LABS (all labs ordered are listed, but only abnormal results are displayed)  Labs Reviewed  COMPREHENSIVE METABOLIC PANEL - Abnormal; Notable for the following components:      Result Value   Potassium 3.4 (*)    All other components within normal limits  CBC WITH DIFFERENTIAL/PLATELET - Abnormal; Notable for the following components:   WBC 11.0 (*)    Platelets 443 (*)    All other components within normal limits  SARS CORONAVIRUS 2 BY RT PCR  LIPASE, BLOOD  BRAIN NATRIURETIC PEPTIDE  URINALYSIS, ROUTINE W REFLEX MICROSCOPIC  RAPID HIV SCREEN (HIV 1/2 AB+AG)  TROPONIN I (HIGH SENSITIVITY)  TROPONIN I (HIGH SENSITIVITY)   ____________________________________________  RADIOLOGY  DG Chest 2 View  Result Date: 10/24/2022 CLINICAL DATA:  Cough, shortness of breath EXAM: CHEST - 2 VIEW COMPARISON:  Chest radiograph dated 08/17/2021. FINDINGS: The heart size and mediastinal contours are within normal limits. Vascular calcifications are seen in the aortic arch. Both  lungs are clear. Degenerative changes are seen in the spine. IMPRESSION: No active cardiopulmonary disease. Electronically Signed   By: Romona Curls M.D.   On: 10/24/2022 09:58    ____________________________________________   PROCEDURES  Procedure(s) performed:   Procedures  None ____________________________________________   INITIAL IMPRESSION / ASSESSMENT AND PLAN / ED COURSE  Pertinent labs & imaging results that were available during my care of the patient were reviewed by me and considered in my medical decision making (see chart for details).   This patient is Presenting for Evaluation of abdominal pain, which does require a range of treatment options, and is a complaint that involves a high risk of morbidity and mortality.  The Differential Diagnoses includes but is not exclusive to acute cholecystitis, intrathoracic causes for epigastric abdominal pain, gastritis, duodenitis, pancreatitis, small bowel or large bowel obstruction, abdominal aortic aneurysm, hernia, gastritis, etc.   Critical Interventions-    Medications  sodium chloride 0.9 % bolus 500 mL (0 mLs Intravenous Stopped 10/24/22 1234)  iohexol (OMNIPAQUE) 300 MG/ML solution 100 mL (100 mLs Intravenous Contrast Given 10/24/22 1036)    Reassessment after intervention:  symptoms improved.   Clinical Laboratory Tests Ordered, included UA without infection.  COVID-negative.  Mild leukocytosis on CBC to 11 without anemia.  LFTs and bilirubin along with lipase normal.  No acute kidney injury.  Radiologic Tests Ordered, included CXR and CT A/P. I independently interpreted the images and agree with radiology interpretation.   Cardiac Monitor Tracing which shows NSR.    Social Determinants of Health Risk patient is a smoker.    Medical Decision Making: Summary:  Patient presents emergency department with multiple symptoms mostly flulike symptoms but also describing abdominal pain with intractable vomiting and  diarrhea.  No significant tenderness or clear peritonitis on exam.  I do plan for CT imaging, however, given her constellation of symptoms and this being ongoing for nearly 2 weeks.  COVID-negative.   Reevaluation with update and discussion with patient. CT discussed. Plan for symptom mgmt and PCP follow up.   Patient's presentation is most consistent with acute presentation with potential threat to life or bodily function.   Disposition: discharge  ____________________________________________  FINAL CLINICAL IMPRESSION(S) / ED DIAGNOSES  Final diagnoses:  Flu-like symptoms  Thrush     NEW OUTPATIENT MEDICATIONS STARTED DURING THIS VISIT:  Discharge Medication List as of 10/24/2022  3:53 PM     START taking these medications   Details  benzonatate (TESSALON) 100 MG capsule Take 1 capsule (100 mg total) by mouth every 8 (eight) hours., Starting Sun 10/24/2022, Normal    dicyclomine (BENTYL) 20 MG tablet Take 1 tablet (20 mg total) by mouth 3 (three) times daily as needed., Starting Sun 10/24/2022, Normal    nystatin (MYCOSTATIN) 100000 UNIT/ML suspension Take 5 mLs (500,000 Units total) by mouth 4 (four) times daily for 10 days., Starting Sun 10/24/2022, Until Wed 11/03/2022, Normal    ondansetron (ZOFRAN-ODT) 4 MG disintegrating tablet Take 1 tablet (4 mg total) by mouth every 8 (eight) hours as needed., Starting Sun 10/24/2022, Normal  Note:  This document was prepared using Dragon voice recognition software and may include unintentional dictation errors.  Alona Bene, MD, [facility] Emergency Medicine    Adysen Raphael, Arlyss Repress, MD 11/02/22 [phone]

## 2022-11-01 ENCOUNTER — Other Ambulatory Visit (HOSPITAL_COMMUNITY): Payer: Self-pay | Admitting: Psychiatry

## 2022-11-01 DIAGNOSIS — F333 Major depressive disorder, recurrent, severe with psychotic symptoms: Secondary | ICD-10-CM

## 2022-11-08 ENCOUNTER — Other Ambulatory Visit: Payer: Self-pay | Admitting: Neurology

## 2022-11-09 ENCOUNTER — Encounter: Payer: Self-pay | Admitting: Psychology

## 2022-11-09 DIAGNOSIS — R32 Unspecified urinary incontinence: Secondary | ICD-10-CM | POA: Insufficient documentation

## 2022-11-09 DIAGNOSIS — I6381 Other cerebral infarction due to occlusion or stenosis of small artery: Secondary | ICD-10-CM | POA: Insufficient documentation

## 2022-11-09 DIAGNOSIS — M51369 Other intervertebral disc degeneration, lumbar region without mention of lumbar back pain or lower extremity pain: Secondary | ICD-10-CM | POA: Insufficient documentation

## 2022-11-09 DIAGNOSIS — F4310 Post-traumatic stress disorder, unspecified: Secondary | ICD-10-CM | POA: Insufficient documentation

## 2022-11-10 ENCOUNTER — Ambulatory Visit (INDEPENDENT_AMBULATORY_CARE_PROVIDER_SITE_OTHER): Payer: MEDICAID | Admitting: Psychology

## 2022-11-10 ENCOUNTER — Encounter: Payer: Self-pay | Admitting: Psychology

## 2022-11-10 ENCOUNTER — Ambulatory Visit: Payer: MEDICAID | Admitting: Psychology

## 2022-11-10 DIAGNOSIS — F331 Major depressive disorder, recurrent, moderate: Secondary | ICD-10-CM

## 2022-11-10 DIAGNOSIS — F4310 Post-traumatic stress disorder, unspecified: Secondary | ICD-10-CM

## 2022-11-10 DIAGNOSIS — R4189 Other symptoms and signs involving cognitive functions and awareness: Secondary | ICD-10-CM

## 2022-11-10 DIAGNOSIS — F09 Unspecified mental disorder due to known physiological condition: Secondary | ICD-10-CM

## 2022-11-10 DIAGNOSIS — I639 Cerebral infarction, unspecified: Secondary | ICD-10-CM

## 2022-11-10 DIAGNOSIS — F411 Generalized anxiety disorder: Secondary | ICD-10-CM

## 2022-11-10 NOTE — Progress Notes (Signed)
NEUROPSYCHOLOGICAL EVALUATION [name] Department of Neurology  Date of Evaluation: November 10, 2022  Reason for Referral:   Denise [name] is a 52 y.o. right-handed African-American female referred by [name], D.O., to characterize her current cognitive functioning and assist with diagnostic clarity and treatment planning in the context of subjective cognitive decline and numerous psychiatric and medical comorbidities.   Assessment and Plan:   Clinical Impression(s): During testing, task engagement was adequate initially. However, after a limited amount of testing was completed, Denise [name] became repetitive and somewhat robotic in stating "I'm going into shut down mode, I'm going into shut down mode." This may have been due to escalating anxiety created by perceived poor performance across certain tasks. As this occurred, she expressed her intention to fully discontinue the evaluation immediately. She responded marginally to psychometrist encouragement to persist. She was able to complete limited testing after this event occurred. However, testing was notably abbreviated due to poor testing tolerance. Performances across validity testing was variable and there was some behavioral concern for suboptimal engagement at times. For example, across memory testing, Denise [name]'s immediate response was to repeatedly state "I don't know" when asked to recall information. However, when pressed by the psychometrist to provide an answer, Denise [name] eventually was able to demonstrate adequate performances (e.g., 19/20 on a list recognition task despite her initial response suggesting full amnesia). As such, current results should be interpreted with caution as there remains the potential that lower scores reflect the impact of psychiatric factors, testing anxiety, and limited testing tolerance rather than her true abilities. There were also  concerns surrounding her responses across questionnaires as she was noted in saying "yes, yes to all of them" prior to actually reading the questions and considering a thoughtful response.   If taken at face value, Denise [name]'s pattern of performance is suggestive of primary deficits surrounding encoding (i.e., learning) and delayed retrieval aspects of memory. A relative weakness could be argued across confrontation naming while performance variability was exhibited across processing speed, visuospatial abilities, and recognition/consolidation aspects of memory. Performances were appropriate relative to age-matched peers across basic attention, cognitive flexibility, and verbal fluency.  The etiology for day-to-day dysfunction and variability/weakness across testing is multifactorial in nature. Developmentally, while Denise [name] was extremely vague and often could not provide specific details, she reported only completing the 6th grade and was enrolled in some form of special education courses in academic settings. As such, this presents the possibility and arguably likelihood that some cognitive weaknesses are longstanding in nature. 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 The size and location of said infarcts could reasonably provide some disruptions surrounding processing speed, attention/concentration, and executive functioning. From a non-neurological perspective, medical records suggest numerous comorbidities which would also impact these same cognitive domains, as well as learning and memory. This includes her report of daily headaches, severe sleep dysfunction, and moderate to severe psychiatric distress surrounding major depressive disorder, generalized anxiety disorder, and post-traumatic stress disorder (PTSD). Prior alcohol and cocaine  use/abuse could also impact cognitive functioning depending on the length and severity of said substance abuse. The most likely culprit for Denise [name]'s current clinical presentation would be longstanding intellectual weaknesses, exacerbated by her stroke history, frequent headaches, sleep dysfunction, and psychiatric history.  Given her age, the presence of a neurodegenerative illness is highly unlikely. She does not display behavioral characteristics to warrant concern  surrounding Lewy body disease, Parkinson's disease, another more rare parkinsonian condition, or frontotemporal lobar degeneration that cannot be reasonably explained by her developmental/medical/psychiatric history described above. Despite some weaknesses learning and spontaneously retrieving learned information on demand, she was able to demonstrate some appropriate memory storage. This would not suggest an underlying storage impairment. This memory pattern is consistent with what is theorized above and does not offer compelling concerns surrounding early onset Alzheimer's disease at the present time.   Recommendations: A combination of medication and psychotherapy has been shown to be most effective at treating symptoms of anxiety and depression. As such, Denise [name] is encouraged to speak with her prescribing physician regarding medication adjustments to optimally manage these symptoms. Likewise, Denise [name] is strongly encouraged to engage in short-term psychotherapy to address symptoms of psychiatric distress. she would benefit from an active and collaborative therapeutic environment, rather than one purely supportive in nature. Recommended treatment modalities include Cognitive Behavioral Therapy (CBT) or Acceptance and Commitment Therapy (ACT).  It will be important for Denise [name] to have another person with her when in situations where she may need to process information, weigh the pros and cons of  different options, and make decisions, in order to ensure that she fully understands and recalls all information to be considered.  Ms. Jagielski is encouraged to attend to lifestyle factors for brain health (e.g., regular physical exercise, good nutrition habits and consideration of the MIND-DASH diet, regular participation in cognitively-stimulating activities, and general stress management techniques), which are likely to have benefits for both emotional adjustment and cognition. In fact, in addition to promoting good general health, regular exercise incorporating aerobic activities (e.g., brisk walking, jogging, cycling, etc.) has been demonstrated to be a very effective treatment for depression and stress, with similar efficacy rates to both antidepressant medication and psychotherapy. Optimal control of vascular risk factors (including safe cardiovascular exercise and adherence to dietary recommendations) is encouraged. Likewise, continued compliance with her CPAP machine will also be important. Tobacco cessation is strongly encouraged. Continued participation in activities which provide mental stimulation and social interaction is also recommended.   Memory can be improved using internal strategies such as rehearsal, repetition, chunking, mnemonics, association, and imagery. External strategies such as written notes in a consistently used memory journal, visual and nonverbal auditory cues such as a calendar on the refrigerator or appointments with alarm, such as on a cell phone, can also help maximize recall.    When learning new information, she would benefit from information being broken up into small, manageable pieces. she may also find it helpful to articulate the material in her own words and in a context to promote encoding at the onset of a new task. This material may need to be repeated multiple times to promote encoding.  To address problems with processing speed, she may wish to  consider:   -Ensuring that she is alerted when essential material or instructions are being presented   -Adjusting the speed at which new information is presented   -Allowing for more time in comprehending, processing, and responding in conversation   -Repeating and paraphrasing instructions or conversations aloud  To address problems with fluctuating attention and/or executive dysfunction, she may wish to consider:   -Avoiding external distractions when needing to concentrate   -Limiting exposure to fast paced environments with multiple sensory demands   -Writing down complicated information and using checklists   -Attempting and completing one task at a time (i.e., no multi-tasking)   -Verbalizing aloud each step  of a task to maintain focus   -Taking frequent breaks during the completion of steps/tasks to avoid fatigue   -Reducing the amount of information considered at one time   -Scheduling more difficult activities for a time of day where she is usually most alert  Review of Records:   [name] was seen by [facility] [name], D.O.) on 04/14/2015 for syncope. Briefly, over the prior several years, she described gradual decline with personality changes and child-like behavior. In 2015, she experienced 4-5 syncopal episodes. No cause was found.  She described spells where she suddenly experiences a bi-temporal/posterior headache followed by tremulousness and a glassy look in her eyes. She is not responsive during these spells, which were said to last about 30 seconds and occur about 3 times a week. Hunger was said to potentially trigger said spells. On the morning of 02/07/2015, she woke up and had an episode of nausea and vomiting. She then experienced habitual spells with onset of occipital head, chest, and abdominal pain, which caused her to cry out. Her husband woke and found her on the floor. Per her husband, she appeared "glassy-eyed", trembling, and was unresponsive.  This lasted about 5-10 minutes and she was confused afterwards for 20 to 30 minutes.  She did not have incontinence or tongue bite. She was admitted to [facility]. Blood pressure in the ED was 175/115. CBC and UA revealed no evidence of infection. BMET was unremarkable. Head CT demonstrated attenuation in the left anterior basal ganglia. 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 Head MRA and MRV were unremarkable. She underwent a lumbar puncture which was unremarkable. Cell count was 1, glucose 71, protein 25, gram stain culture and fungal culture negative. VDRL was negative for neurosyphilis. 2D echo showed EF 60-65% with grade 1 diastolic dysfunction. Carotid doppler showed no hemodynamically significant stenosis. She exhibited inconsistencies on neurologic exam. Routine EEG was negative. On the second day of admission, she had a witnessed spell in which she was shaking while undergoing physical therapy. She appeared postictal, but was not started on an AED. It was recommended that she stay for a sleep-deprived EEG but she left AMA. Performance on a brief cognitive screening instrument (MMSE) with Dr. Everlena Cooper was 15/30. At that time, he suspected a psychogenic etiology for potential seizure activity. A 72-hour EEG on 05/12/2015 was normal.   She continued to follow with Dr. 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 When meeting with Dr. Everlena Cooper on 10/15/2019, she presented due to emerging headache symptoms. She had been briefly hospitalized due to lithium toxicity prior to this. Head CT at that time revealed a partially empty sella and known old left basal ganglia infarct but no acute intracranial abnormality. She noted that headaches had not improved with prior treatment. Symptoms were said to occur daily, lasting about two hours per day. She described it feeling as though the entire top of head (in cap distribution) is being stabbed with hot pokers. She also  endorsed a hot pressure sticking her in her left eye, as well as pressure on the top of her head. Separate from headaches, she reported periodic blurred vision and may zone out from time to time. She has increased difficulty speaking and slurs her speech at times. Medications were adjusted and she was referred to an ophthalmologist for an eye exam and a brain MRI. Brain MRI on 11/05/2019 revealed nonspecific chronic scattered white matter changes with chronic left basal ganglia infarcts and  remote a pontine microhemorrhage.  She most recently met with Dr. Everlena Cooper for follow-up on 06/24/2022. In the interim, a brain MRI on 08/20/2021 revealed mild chronic small vessel ischemic changes within the cerebral white matter and pons with chronic infarcts within the posterior right lentiform nucleus and left corona radiata and basal ganglia/internal capsule. During this appointment, her husband expressed concerns surrounding short-term memory loss and broad cognitive dysfunction. She was advised to start vitamin B12 supplementation but there were concerns that she was not following through with this recommendation. Medications were adjusted. Ultimately, Denise [name] was also referred for a comprehensive neuropsychological evaluation to characterize her cognitive abilities and to assist with diagnostic clarity and treatment planning.  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   Chronic diastolic CHF (congestive heart failure) 07/29/2021   Class 3 obesity with alveolar hypoventilation, serious comorbidity, and body mass index (BMI) of 50.0 to 59.9 in adult 06/12/2020   CVA (cerebrovascular accident) 2017   2017 MRI - chronic left lenticulostriate territory hemorrhage or hemorrhagic infarct, with hemosiderin, and  encephalomalacia affecting both the caudate and lentiform nucleus     Decreased mobility 06/15/2021   DOE (dyspnea on exertion) 06/12/2020   Essential hypertension 12/18/2014   Fibroid uterus 12/18/2014   Heart murmur, systolic 06/29/2021   History of alcohol abuse 02/16/2017   History of cocaine use 02/16/2017   History of latent syphilis 02/16/2017   Hyperlipidemia associated with type 2 diabetes mellitus 11/07/2020   Incontinence of urine    Lacunar infarction    MRI - chronic lacunar infarct within the posterior right lentiform nucleus.     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/2017   PTSD (post-traumatic stress disorder)    Pulmonary emphysema 08/31/2021   QT prolongation 06/29/2021   Right sided weakness    Social anxiety disorder 11/03/2017   Syncope 02/07/2015   TIA (transient ischemic attack)    Tobacco use 04/14/2015   Type 2 diabetes mellitus 10/21/2017   Venous stasis dermatitis of both lower extremities 06/15/2021    Past Surgical History:  Procedure Laterality Date   CESAREAN SECTION     x 2    Current Outpatient Medications:    amLODipine (NORVASC) 10 MG tablet, Take 10 mg by mouth daily., Disp: , Rfl:    atorvastatin (LIPITOR) 20 MG tablet, TAKE 1 TABLET BY MOUTH EVERY DAY, Disp: 90 tablet, Rfl: 3   benzonatate (TESSALON) 100 MG capsule, Take 1 capsule (100 mg total) by mouth every 8 (eight) hours., Disp: 21 capsule, Rfl: 0   budesonide-formoterol (SYMBICORT) 160-4.5 MCG/ACT inhaler, Inhale 2 puffs into the lungs in the morning and at bedtime., Disp: 10.2 g, Rfl: 11   dicyclomine (BENTYL) 20 MG tablet, Take 1 tablet (20 mg total) by mouth 3  (three) times daily as needed., Disp: 20 tablet, Rfl: 0   empagliflozin (JARDIANCE) 10 MG TABS tablet, Take 1 tablet (10 mg total) by mouth daily., Disp: 30 tablet, Rfl:  1   furosemide (LASIX) 80 MG tablet, TAKE 1 TABLET BY MOUTH EVERY DAY, Disp: 90 tablet, Rfl: 0   gabapentin (NEURONTIN) 300 MG capsule, TAKE 3 CAPSULES BY MOUTH 3 TIMES DAILY, Disp: 270 capsule, Rfl: 1   lisinopril (ZESTRIL) 20 MG tablet, Take 20 mg by mouth 2 (two) times daily., Disp: , Rfl:    losartan (COZAAR) 25 MG tablet, Take 1 tablet (25 mg total) by mouth daily., Disp: 30 tablet, Rfl: 1   meloxicam (MOBIC) 15 MG tablet, Take 15 mg by mouth daily., Disp: , Rfl:    metFORMIN (GLUCOPHAGE) 500 MG tablet, Take 500 mg by mouth 2 (two) times daily., Disp: , Rfl: 3   nicotine (NICODERM CQ - DOSED IN MG/24 HOURS) 14 mg/24hr patch, Place 1 patch (14 mg total) onto the skin daily., Disp: 28 patch, Rfl: 0   ondansetron (ZOFRAN-ODT) 4 MG disintegrating tablet, Take 1 tablet (4 mg total) by mouth every 8 (eight) hours as needed., Disp: 20 tablet, Rfl: 0   Polyethylene Glycol 3350 (PEG 3350) 17 GM/SCOOP POWD, Take 17 g by mouth daily as needed (constipation)., Disp: , Rfl:    potassium chloride SA (KLOR-CON M) 20 MEQ tablet, Take 1 tablet (20 mEq total) by mouth daily., Disp: 30 tablet, Rfl: 1   Spacer/Aero-Holding Chambers (AEROCHAMBER MV) inhaler, Use as instructed, Disp: 1 each, Rfl: 0   VENTOLIN HFA 108 (90 Base) MCG/ACT inhaler, inhale 2 PUFFS into THE lungs EVERY 6 HOURS AS NEEDED FOR WHEEZING OR SHORTNESS OF BREATH, Disp: 18 g, Rfl: 11   vortioxetine HBr (TRINTELLIX) 10 MG TABS tablet, Take 1 tablet (10 mg total) by mouth daily., Disp: 90 tablet, Rfl: 0  Clinical Interview:   The following information was obtained during a clinical interview with Denise [name] prior to cognitive testing.  Cognitive Symptoms: Decreased short-term memory: Endorsed. She described primary difficulties recalling details of recently held  conversations. She also described trouble misplacing/losing items in her environment and entering rooms and forgetting her original intention. She added that her husband has made comments surrounding her being more repetitive in day-to-day conversation. She was unsure how long memory difficulties had been present but did agree that it has been several years at least. She reported her perception of progressive cognitive decline over time.  Decreased long-term memory: Denied. Decreased attention/concentration: Endorsed. She reported severe difficulty with sustained focus, increased distractibility, and very frequently losing her train of thought.  Reduced processing speed: Endorsed. Difficulties with executive functions: Endorsed. She reported severe difficulty with organization, decision making, and multi-tasking. She did not report impulsivity. She also denied prominent personality changes.  Difficulties with emotion regulation: Denied. Difficulties with receptive language: Denied. Difficulties with word finding: Endorsed. Decreased visuoperceptual ability: Endorsed. She noted frequently bumping into things in her environment and that she has a large number of bruises on her legs because of this.   Difficulties completing ADLs: Denied. She alluded to utilizing a pillbox or another organizational system and is able to take her medications adequately. She denied trouble with financial management or bill paying responsibilities. As she was unaccompanied, I have no informant information to confirm or refute this. She does not drive, noting that she never obtained a drivers license throughout her life.  Additional Medical History: History of traumatic brain injury/concussion: Denied. History of stroke: See neuroimaging reports above.  History of seizure activity: See concerns surrounding psychogenic nonepileptic seizure activity above.  History of known exposure to toxins: Denied. Symptoms of chronic  pain: Denied. Experience  of frequent headaches/migraines: See headache concerns described above. She continued to report daily headache experiences.  Frequent instances of dizziness/vertigo: Denied.  Sensory changes: She reported episodic periods of blurred vision due to unknown reasons. She also reported mildly diminished hearing. She denied trouble with her senses of taste or smell.  Balance/coordination difficulties: Endorsed. She described her balance as "not good." She acknowledged a history of frequent falling but then later clarified that these represent her falling out of bed rather than her falling while ambulating. She noted that the left side of her body feels weaker relative to her right and that balance can be impacted by sporadic tremors impacting her left leg.  Other motor difficulties: As stated above, she described sporadic tremors impacting her left leg. The cause for this was unknown.   Sleep History: Estimated hours obtained each night: Unclear. She described her sleep as "not good" but was unable to provide a numerical estimation.  Difficulties falling asleep: Denied. Difficulties staying asleep: Endorsed. She described her sleep as being very broken in nature.  Feels rested and refreshed upon awakening: Denied.  History of snoring: Endorsed. History of waking up gasping for air: Endorsed. Witnessed breath cessation while asleep: Endorsed. She has a history of obstructive sleep apnea and utilizes her CPAP machine nightly.   History of vivid dreaming: Denied. Excessive movement while asleep: Unclear.  Instances of acting out her dreams: Unclear. As stated above, she reported falling out of bed. This was assumed to be while she was asleep. She was very vague and unable to provide any details surrounding these events or if she experiences physical movements while asleep (she continually repeated "I don't know" when asked).   Psychiatric/Behavioral Health  History: Depression/Anxiety: She was repetitive in stating "I don't know" when asked about her current mood. She eventually added "just here" but was overall unable to provide any details surrounding her psychiatric history. Medical records suggest prior concerns surrounding major depressive disorder (including severe symptomatology at times) and generalized anxiety disorder. Medical records suggest a history of lithium toxicity. She was unable to discuss this hospitalization or why she might have been taking this medication. I do not see records to suggest a prior history of bipolar disorder or manic/hypomanic experiences. Current or remote suicidal ideation, intent, or plan was denied.  Trauma History: As stated above, she was unable to provide any psychiatric history. Medical records suggest a history of diagnosed PTSD.  Visual/auditory hallucinations: Denied. Delusional thoughts: Denied.  Tobacco: Endorsed. She was unable to provide an estimation for how much she smokes daily, only stating "my husband says too much." Alcohol: She denied current alcohol consumption. Medical records suggest a history of remote alcohol abuse/dependence. She was unable to provide any clarity surrounding this.  Recreational drugs: Denied. Medical records suggest a remote history of cocaine use/abuse. She was unable to provide any clarity surrounding this.   Family History: Problem Relation Age of Onset   Stroke Mother    Anxiety disorder Mother    Drug abuse Mother    Alcohol abuse Mother    Depression Mother    Drug abuse Father    Anxiety disorder Father    Heart disease Maternal Grandmother    Colon cancer Neg Hx    Esophageal cancer Neg Hx    Pancreatic cancer Neg Hx    Stomach cancer Neg Hx    Liver disease Neg Hx    Breast cancer Neg Hx    This information was confirmed by Denise [name].  Academic/Vocational History: Highest level of educational attainment: 6 years. She completed up until the  6th grade. She was unable to describe her academic performance ("I don't know") throughout school settings but at one point did allude to taking some "special education" courses. She reported leaving school after the 6th grade following her becoming pregnant with her daughter.  History of developmental delay: Unclear.  History of grade repetition: Unclear.  Enrollment in special education courses: Unclear but she did allude to this being true. Further details were unable to be obtained.  History of LD/ADHD: Unclear.   Employment: Disability.   Evaluation Results:   Behavioral Observations: Ms. Stockinger was unaccompanied, arrived to her appointment on time, and was appropriately dressed and groomed. She appeared alert. She ambulated with the assistance of a rolling walker and maneuvered this device well. Some bouncing/shakiness was noted in her left leg throughout the interview. This was believed at be related to building anxiety as it was minimal at first but did worsen as the interview progressed. Her affect was positive. As stated above, there were concerns surrounding building anxiety over time. Spontaneous speech was fluent and word finding difficulties were not observed during the clinical interview. Thought processes were normal in content. She presented as a very poor historian and was often unable to answer historical questions surrounding her life, as well as questions surrounding more recent functioning. Insight into her cognitive difficulties appeared poor as a result.   During testing, sustained attention was appropriate. Task engagement was adequate initially. However, after limited amount of testing was completed, Denise [name] became repetitive and somewhat robotic in stating "I'm going into shut down mode, I'm going into shut down mode." This may have been due to escalating anxiety created by perceived poor performance across certain tasks. As this occurred, she expressed  her intention to fully discontinue the evaluation immediately. She responded marginally to psychometrist encouragement to persist. She was able to complete limited testing after this event occurred. However, testing was notably abbreviated due to poor testing tolerance.  Adequacy of Effort: The validity of neuropsychological testing is limited by the extent to which the individual being tested may be assumed to have exerted adequate effort during testing. Performances across validity testing was variable and there was some behavioral concern for suboptimal engagement at times. For example, across memory testing, Denise [name]'s immediate response was to repeatedly state "I don't know" when asked to recall information. However, when pressed by the psychometrist to provide an answer, Denise [name] eventually was able to demonstrate adequate performances with encouragement (e.g., 19/20 on a list recognition task despite her initial response suggesting full amnesia). As such, current results should be interpreted with caution as there remains the potential that lower scores reflect the impact of psychiatric factors, testing anxiety, and limited testing tolerance rather than her true abilities. There were also concerns surrounding her responses across questionnaires as she was noted in saying "yes, yes to all of them" prior to actually reading the questions and considering a thoughtful response.   Test Results: Ms. Pearman was fully oriented at the time of the current evaluation.  Intellectual abilities based upon educational and vocational attainment were estimated to be in the below average range. Premorbid abilities were estimated to be within the below average range based upon a single-word reading test.   Processing speed was variable, ranging from the well below average to average normative ranges. Basic attention was above average. More complex attention (e.g., working memory) was  unable to be  assessed due to poor testing tolerance. Cognitive flexibility was average. Other aspects of executive functioning were unable to be assessed.  Receptive language abilities were unable to be directly assessed. Denise [name] did not exhibit any difficulties comprehending task instructions and generally answered questions asked of her adequately. Assessed expressive language was somewhat variable. Phonemic fluency was average to above average, semantic fluency was above average, and confrontation naming was well below average. However, the latter task was merely a screening task where she only made two errors in total. She was unable to complete a more comprehensive task as intended.   Assessed visuospatial/visuoconstructional abilities were exceptionally low outside of her drawing of a clock which was appropriate. Points were lost on her copy of a complex figure numerous visual distortions and several items being omitted entirely. Poor effort was suspected by the psychometrist across this task.   Learning (i.e., encoding) of novel verbal information was exceptionally low. Spontaneous delayed recall (i.e., retrieval) of previously learned information was exceptionally low to well below average. Limited effort/engagement was suspected by the psychometrist across these tasks. Performance across recognition tasks was variable, ranging from the well below average to average normative ranges, suggesting some evidence for information consolidation.   Results of emotional screening instruments suggested that recent symptoms of generalized anxiety were in the moderate range, while symptoms of depression were also within the moderate range. A screening instrument assessing recent sleep quality suggested the presence of severe sleep dysfunction.  Tables of Scores:   Note: This summary of test scores accompanies the interpretive report and should not be considered in isolation without reference to  the appropriate sections in the text. Descriptors are based on appropriate normative data and may be adjusted based on clinical judgment. Terms such as "Within Normal Limits" and "Outside Normal Limits" are used when a more specific description of the test score cannot be determined.       Percentile - Normative Descriptor > 98 - Exceptionally High 91-97 - Well Above Average 75-90 - Above Average 25-74 - Average 9-24 - Below Average 2-8 - Well Below Average < 2 - Exceptionally Low       Validity:   DESCRIPTOR       ACS WC: --- --- Within Normal Limits  DCT: --- --- Outside Normal Limits  RBANS EI: --- --- Within Normal Limits       Orientation:      Raw Score Percentile   NAB Orientation, Form 1 29/29 --- ---       Cognitive Screening:      Raw Score Percentile   SLUMS: 18/30 --- ---       RBANS, Form A: Standard Score/ Scaled Score Percentile   Total Score 65 1 Exceptionally Low  Immediate Memory 44 <1 Exceptionally Low    List Learning 2 <1 Exceptionally Low    Story Memory 1 <1 Exceptionally Low  Visuospatial/Constructional 53 <1 Exceptionally Low    Figure Copy 1 <1 Exceptionally Low    Line Orientation 7/20 <2 Exceptionally Low  Language 96 39 Average    Picture Naming 8/10 3-9 Well Below Average    Semantic Fluency 13 84 Above Average  Attention 88 21 Below Average    Digit Span 12 75 Above Average    Coding 4 2 Well Below Average  Delayed Memory 82 12 Below Average    List Recall 0/10 <2 Exceptionally Low    List Recognition 19/20 26-50 Average    Story Recall 3 1 Exceptionally Low  Story Recognition 7/12 5-7 Well Below Average    Figure Recall 5 5 Well Below Average    Figure Recognition 4/8 9-20 Below Average        Intellectual Functioning:      Standard Score Percentile   Test of Premorbid Functioning: 80 9 Below Average       Attention/Executive Function:     Color Trail Making Test: Raw Score (Z-Score) Percentile     Part 1 60 secs.,  0 errors  (-0.32) 37 Average    Part 2 105 secs.,  1 error (0.51) 69 Average        D-KEFS Verbal Fluency Test: Raw Score (Scaled Score) Percentile     Letter Total Correct 34 (9) 37 Average    Category Total Correct Discontinued  (pt refused) --- ---    Category Switching Total Correct Discontinued  (pt refused) --- ---    Category Switching Accuracy --- --- ---      Total Set Loss Errors --- --- ---      Total Repetition Errors --- --- ---       Language:     Verbal Fluency Test: Raw Score (T Score) Percentile     Phonemic Fluency (FAS) 34 (57) 75 Above Average    Animal Fluency 19 (61) 86 Above Average        Visuospatial/Visuoconstruction:      Raw Score Percentile   Clock Drawing: 9/10 --- Within Normal Limits       Mood and Personality:      Raw Score Percentile   PROMIS Depression Questionnaire: 26 --- Moderate  PROMIS Anxiety Questionnaire: 22 --- Moderate       Additional Questionnaires:      Raw Score Percentile   PROMIS Sleep Disturbance Questionnaire: 40 --- Severe   Informed Consent and Coding/Compliance:   The current evaluation represents a clinical evaluation for the purposes previously outlined by the referral source and is in no way reflective of a forensic evaluation.   Ms. Achenbach was provided with a verbal description of the nature and purpose of the present neuropsychological evaluation. Also reviewed were the foreseeable risks and/or discomforts and benefits of the procedure, limits of confidentiality, and mandatory reporting requirements of this provider. The patient was given the opportunity to ask questions and receive answers about the evaluation. Oral consent to participate was provided by the patient.   This evaluation was conducted by Newman Nickels, Ph.D., ABPP-CN, board certified clinical neuropsychologist. Denise [name] completed a clinical interview with Dr. Milbert Coulter, billed as one unit [phone], and 85 minutes of cognitive testing and scoring,  billed as one unit [id number] and two additional units [id number]. Psychometrist Wallace Keller, B.S. assisted Dr. Milbert Coulter with test administration and scoring procedures. As a separate and discrete service, one unit [id number] and two units [phone] were billed for Dr. Tammy Sours time spent in interpretation and report writing.

## 2022-11-10 NOTE — Progress Notes (Signed)
   Psychometrician Note   Cognitive testing was administered to Denise [name] by Wallace Keller, B.S. (psychometrist) under the supervision of Dr. Newman Nickels, Ph.D., licensed psychologist on 11/10/2022. Denise [name] did not appear overtly distressed by the testing session per behavioral observation or responses across self-report questionnaires. Rest breaks were offered.    The battery of tests administered was selected by Dr. Newman Nickels, Ph.D. with consideration to Denise [name]'s current level of functioning, the nature of her symptoms, emotional and behavioral responses during interview, level of literacy, observed level of motivation/effort, and the nature of the referral question. This battery was communicated to the psychometrist. Communication between Dr. Newman Nickels, Ph.D. and the psychometrist was ongoing throughout the evaluation and Dr. Newman Nickels, Ph.D. was immediately accessible at all times. Dr. Newman Nickels, Ph.D. provided supervision to the psychometrist on the date of this service to the extent necessary to assure the quality of all services provided.    Denise [name] will return within approximately 1-2 weeks for an interactive feedback session with Dr. Milbert Coulter at which time her test performances, clinical impressions, and treatment recommendations will be reviewed in detail. Denise [name] understands she can contact our office should she require our assistance before this time.  A total of 85 minutes of billable time were spent face-to-face with Denise [name] by the psychometrist. This includes both test administration and scoring time. Billing for these services is reflected in the clinical report generated by Dr. Newman Nickels, Ph.D.  This note reflects time spent with the psychometrician and does not include test scores or any clinical interpretations made by Dr. Milbert Coulter. The full report will follow in a separate  note.

## 2022-11-16 ENCOUNTER — Other Ambulatory Visit: Payer: Self-pay | Admitting: Neurology

## 2022-11-18 ENCOUNTER — Ambulatory Visit (INDEPENDENT_AMBULATORY_CARE_PROVIDER_SITE_OTHER): Payer: MEDICAID | Admitting: Psychology

## 2022-11-18 DIAGNOSIS — F09 Unspecified mental disorder due to known physiological condition: Secondary | ICD-10-CM

## 2022-11-18 DIAGNOSIS — F331 Major depressive disorder, recurrent, moderate: Secondary | ICD-10-CM

## 2022-11-18 DIAGNOSIS — I639 Cerebral infarction, unspecified: Secondary | ICD-10-CM | POA: Diagnosis not present

## 2022-11-18 DIAGNOSIS — F4310 Post-traumatic stress disorder, unspecified: Secondary | ICD-10-CM | POA: Diagnosis not present

## 2022-11-18 NOTE — Progress Notes (Signed)
   Neuropsychology Feedback Session Denise [name]. [facility] [place] Department of Neurology  Reason for Referral:   Denise [name] is a 52 y.o. right-handed African-American female referred by [name], D.O., to characterize her current cognitive functioning and assist with diagnostic clarity and treatment planning in the context of subjective cognitive decline and numerous psychiatric and medical comorbidities.   Feedback:   [name] completed a comprehensive neuropsychological evaluation on 11/10/2022. Please refer to that encounter for the full report and recommendations. Briefly, testing tolerance concerns were noted, largely due to suspected testing anxiety. If taken at face value, primary deficits surrounded encoding (i.e., learning) and delayed retrieval aspects of memory. A relative weakness could be argued across confrontation naming while performance variability was exhibited across processing speed, visuospatial abilities, and recognition/consolidation aspects of memory. The etiology for day-to-day dysfunction and variability/weakness across testing is multifactorial in nature. Developmentally, while [name] was extremely vague and often could not provide specific details, she reported only completing the 6th grade and was enrolled in some form of special education courses in academic settings. As such, this presents the possibility and arguably likelihood that some cognitive weaknesses are longstanding in nature. 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 The size and location of said infarcts could reasonably provide some disruptions surrounding processing speed, attention/concentration, and executive functioning. From a non-neurological perspective, medical records suggest numerous comorbidities which would also  impact these same cognitive domains, as well as learning and memory. This includes her report of daily headaches, severe sleep dysfunction, and moderate to severe psychiatric distress surrounding major depressive disorder, generalized anxiety disorder, and post-traumatic stress disorder (PTSD). Prior alcohol and cocaine use/abuse could also impact cognitive functioning depending on the length and severity of said substance abuse. The most likely culprit for [name]'s current clinical presentation would be longstanding intellectual weaknesses, exacerbated by her stroke history, frequent headaches, sleep dysfunction, and psychiatric history.  [name] was unaccompanied during the current telephone call. She was within her residence while I was within my office. I discussed the limitations of evaluation and management by telemedicine and the availability of in person appointments. [name] expressed her understanding and agreed to proceed. Content of the current session focused on the results of her neuropsychological evaluation. [name] was given the opportunity to ask questions and her questions were answered. She was encouraged to reach out should additional questions arise. A copy of her report was mailed at the conclusion of the visit.      One unit [phone] was billed for Dr. Tammy Sours time spent preparing for, conducting, and documenting the current feedback session with [name].

## 2022-12-04 ENCOUNTER — Other Ambulatory Visit: Payer: Self-pay | Admitting: Neurology

## 2022-12-08 ENCOUNTER — Ambulatory Visit (INDEPENDENT_AMBULATORY_CARE_PROVIDER_SITE_OTHER): Payer: Medicaid Other | Admitting: Nurse Practitioner

## 2022-12-08 ENCOUNTER — Ambulatory Visit: Payer: Medicaid Other

## 2022-12-08 ENCOUNTER — Encounter: Payer: Self-pay | Admitting: Nurse Practitioner

## 2022-12-08 ENCOUNTER — Other Ambulatory Visit: Payer: Self-pay | Admitting: Neurology

## 2022-12-08 VITALS — BP 109/69 | HR 89 | Temp 96.9°F | Ht 65.0 in | Wt 312.8 lb

## 2022-12-08 DIAGNOSIS — J411 Mucopurulent chronic bronchitis: Secondary | ICD-10-CM

## 2022-12-08 DIAGNOSIS — R0609 Other forms of dyspnea: Secondary | ICD-10-CM

## 2022-12-08 DIAGNOSIS — J9611 Chronic respiratory failure with hypoxia: Secondary | ICD-10-CM | POA: Diagnosis not present

## 2022-12-08 DIAGNOSIS — Z6841 Body Mass Index (BMI) 40.0 and over, adult: Secondary | ICD-10-CM

## 2022-12-08 DIAGNOSIS — F17200 Nicotine dependence, unspecified, uncomplicated: Secondary | ICD-10-CM | POA: Diagnosis not present

## 2022-12-08 DIAGNOSIS — I5032 Chronic diastolic (congestive) heart failure: Secondary | ICD-10-CM

## 2022-12-08 DIAGNOSIS — G4733 Obstructive sleep apnea (adult) (pediatric): Secondary | ICD-10-CM

## 2022-12-08 DIAGNOSIS — E66813 Obesity, class 3: Secondary | ICD-10-CM

## 2022-12-08 DIAGNOSIS — R0602 Shortness of breath: Secondary | ICD-10-CM

## 2022-12-08 MED ORDER — BREZTRI AEROSPHERE 160-9-4.8 MCG/ACT IN AERO
2.0000 | INHALATION_SPRAY | Freq: Two times a day (BID) | RESPIRATORY_TRACT | 11 refills | Status: DC
Start: 2022-12-08 — End: 2023-02-28

## 2022-12-08 MED ORDER — DOXYCYCLINE HYCLATE 100 MG PO TABS
100.0000 mg | ORAL_TABLET | Freq: Two times a day (BID) | ORAL | 0 refills | Status: AC
Start: 2022-12-08 — End: 2022-12-15

## 2022-12-08 MED ORDER — PREDNISONE 20 MG PO TABS
40.0000 mg | ORAL_TABLET | Freq: Every day | ORAL | 0 refills | Status: AC
Start: 2022-12-08 — End: 2022-12-13

## 2022-12-08 MED ORDER — BREZTRI AEROSPHERE 160-9-4.8 MCG/ACT IN AERO: 2.0000 | INHALATION_SPRAY | Freq: Two times a day (BID) | RESPIRATORY_TRACT | 0 refills | Status: DC

## 2022-12-08 NOTE — Progress Notes (Signed)
@Patient  ID: Denise [name], female    DOB: Apr 16, 1970, 52 y.o.   MRN: [id number]  Chief Complaint  Patient presents with   Follow-up    Referring provider: [facility]  HPI: 52 year old female, active smoker followed for OSA/OHS, chronic bronchitis/emphysema, chronic respiratory failure.  She was previously followed by Dr. Craige Cotta and last seen in office 09/21/2021.  Past medical history significant for CHF, hemorrhagic CVA, psychogenic seizures, PTSD, depression, anxiety, hypertension, history of syphilis, HLD, overactive bladder, COVID-16 March 2019, obesity, DM, hx of substance abuse, cognitive dysfunction.  TEST/EVENTS:  10/07/2017 HST: AHI 25, SpO2 low 69% 08/23/2019 PSG: AHI 10.6/h, SpO2 low 80% >> CPAP 17 cmH2O/2 lpm O2 04/17/2020 PFT: FVC 68, FEV1 75, ratio 91, TLC 68, DLCOcor 60. No BD 07/17/2021 echo: EF 60 to 65%.  RV size and function normal. 07/29/2021 CTA chest: atherosclerosis, minimal atelectasis  10/24/2022 CXR: lungs are clear  10/24/2022 CT abd: visualized lung fields, tiny 2 mm RML. Mild pleural thickening in b/l lower lobes  09/21/2021: OV with Dr. Craige Cotta.  Hospitalized a couple times since she was seen last.  She was told that she needed to wear oxygen 24/7 but was only sent home with a few tanks that were not refilled or replaced.  Has a home oxygen concentrator.  Has not been able to use CPAP for a few months.  She had a break on her tubing.  Still smoking 2 to 3 cigarettes a day.  Has cough and congestion.  Brings up clear sputum.  Uses Symbicort which helps.  Recent echo was normal.  CT chest from July shows minimal atelectasis.  SpO2 on room air was down to 83% after walking.  She recovered on 3 L supplemental O2 with SpO2 staying above 90%.  Will arrange for replacement supplies previously compliant with CPAP.  Smoking cessation advised.  12/08/2022: Today - follow up Discussed the use of AI scribe software for clinical note transcription with the  patient, who gave verbal consent to proceed.  History of Present Illness   The patient, with a history of OSA/OHS, chronic bronchitis and on home oxygen therapy, presents with worsening dyspnea and cough.  She had been seen in the emergency department on 10/24/2022 for flulike symptoms.  RVP was negative.  Imaging was unremarkable.  She was discharged home with supportive care measures and treated for thrush.  She feels like she has gotten a little bit worse since she was last seen in the emergency department.  She reports shortness of breath with minimal exertion, such as moving from the bed to the bathroom. The cough is productive with white sputum, a new symptom for the patient, though the exact onset is unclear. Occasional wheeze. She has issues with memory impairment that is not new. She reports no fevers, chills, or hemoptysis.   The patient also reports increased leg swelling in both legs, more than her usual baseline. She has been experiencing difficulty laying flat at night, even with her CPAP machine on, which has been causing concern for her spouse.  No chest pain, dizziness, palpitations, syncope. No calf pain.   The patient continues to smoke, though she has reduced her daily intake to eight cigarettes from a pack a day, and expresses a desire to quit.   The patient has been using her Symbicort inhaler regularly, two puffs twice a day, and her albuterol inhaler once in the morning and once at night. She is also on home oxygen therapy, but  reports that two liters does not seem sufficient. She was walked at her last OV and required 3 lpm on POC but she did not remember this. She does need O2 renewal today.     11/09/2022-12/08/2022: CPAP 17 cmH2O 29/30 days; 97% >4 hr; average use 12 hr 47 min Leaks 95th 66.3 AHI 2.1  Allergies  Allergen Reactions   Gadolinium Derivatives Nausea And Vomiting    Nausea and vomiting was followed by epileptic seizure episode that lasted approximately 5  minutes; pt was unable to verbally communicate during that time; she then came to and was able to speak; was evaluated by Rad and RN; kms    Grapefruit Flavor [Flavoring Agent] Other (See Comments)    Drug interaction    Immunization History  Administered Date(s) Administered   Influenza Split 11/22/2018   Influenza, Seasonal, Injecte, Preservative Fre 02/05/2020, 12/02/2020   Influenza,inj,Quad PF,6+ Mos 10/25/2016, 10/26/2019   Moderna Covid-19 Fall Seasonal Vaccine 49yrs & older 11/19/2022   PFIZER(Purple Top)SARS-COV-2 Vaccination 04/21/2019, 05/15/2019, 11/09/2019   PNEUMOCOCCAL CONJUGATE-20 06/15/2021   Tdap 06/15/2021   Zoster Recombinant(Shingrix) 06/15/2021    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   Chronic diastolic CHF (congestive heart failure) 07/29/2021   Class 3 obesity with alveolar hypoventilation, serious comorbidity, and body mass index (BMI) of 50.0 to 59.9 in adult 06/12/2020   CVA (cerebrovascular accident) 2017   2017 MRI - chronic left lenticulostriate territory hemorrhage or hemorrhagic infarct, with hemosiderin, and encephalomalacia affecting both the caudate and lentiform nucleus     Decreased mobility 06/15/2021   DOE (dyspnea on exertion) 06/12/2020   Essential hypertension 12/18/2014   Fibroid uterus 12/18/2014   Heart murmur, systolic 06/29/2021   History of alcohol abuse 02/16/2017   History of cocaine use 02/16/2017   History of latent syphilis 02/16/2017   Hyperlipidemia associated with type 2 diabetes mellitus 11/07/2020   Incontinence of urine    Lacunar infarction    MRI - chronic lacunar infarct within the posterior right lentiform nucleus.     Lithium toxicity 09/25/2019   Low back pain 11/21/2017   Lumbar degenerative disc disease     Major depressive disorder 02/16/2017   Nuclear sclerosis of both eyes 10/20/2022   OSA on CPAP 06/12/2020   Overactive bladder    Pain due to onychomycosis of toenails of both feet 05/26/2020   Pelvic pain in female 12/18/2014   Psychogenic nonepileptic seizure 02/17/2017   PTSD (post-traumatic stress disorder)    Pulmonary emphysema 08/31/2021   QT prolongation 06/29/2021   Right sided weakness    Social anxiety disorder 11/03/2017   Syncope 02/07/2015   TIA (transient ischemic attack)    Tobacco use 04/14/2015   Type 2 diabetes mellitus 10/21/2017   Venous stasis dermatitis of both lower extremities 06/15/2021    Tobacco History: Social History   Tobacco Use  Smoking Status Every Day   Current packs/day: 0.40   Types: Cigarettes  Smokeless Tobacco Never  Tobacco Comments   Smoking 1 pack of cigarettes a week. 09/21/21 Tay   Patients states she is smoking about 8 cigarettes a day now. AB, CMA 12-08-2022    Ready to quit: Not Answered Counseling given: Not Answered Tobacco comments: Smoking 1 pack of cigarettes a week. 09/21/21 Tay Patients states she is  smoking about 8 cigarettes a day now. AB, CMA 12-08-2022    Outpatient Medications Prior to Visit  Medication Sig Dispense Refill   amLODipine (NORVASC) 10 MG tablet Take 10 mg by mouth daily.     atorvastatin (LIPITOR) 20 MG tablet TAKE 1 TABLET BY MOUTH EVERY DAY 90 tablet 3   benzonatate (TESSALON) 100 MG capsule Take 1 capsule (100 mg total) by mouth every 8 (eight) hours. 21 capsule 0   budesonide-formoterol (SYMBICORT) 160-4.5 MCG/ACT inhaler Inhale 2 puffs into the lungs in the morning and at bedtime. 10.2 g 11   dicyclomine (BENTYL) 20 MG tablet Take 1 tablet (20 mg total) by mouth 3 (three) times daily as needed. 20 tablet 0   empagliflozin (JARDIANCE) 10 MG TABS tablet Take 1 tablet (10 mg total) by mouth daily. 30 tablet 1   furosemide (LASIX) 80 MG tablet TAKE 1 TABLET BY MOUTH EVERY DAY 90 tablet 0    gabapentin (NEURONTIN) 300 MG capsule TAKE 3 CAPSULES BY MOUTH 3 TIMES DAILY 270 capsule 1   lisinopril (ZESTRIL) 20 MG tablet Take 20 mg by mouth 2 (two) times daily.     losartan (COZAAR) 25 MG tablet Take 1 tablet (25 mg total) by mouth daily. 30 tablet 1   meloxicam (MOBIC) 15 MG tablet Take 15 mg by mouth daily.     metFORMIN (GLUCOPHAGE) 500 MG tablet Take 500 mg by mouth 2 (two) times daily.  3   nicotine (NICODERM CQ - DOSED IN MG/24 HOURS) 14 mg/24hr patch Place 1 patch (14 mg total) onto the skin daily. 28 patch 0   ondansetron (ZOFRAN-ODT) 4 MG disintegrating tablet Take 1 tablet (4 mg total) by mouth every 8 (eight) hours as needed. 20 tablet 0   Polyethylene Glycol 3350 (PEG 3350) 17 GM/SCOOP POWD Take 17 g by mouth daily as needed (constipation).     potassium chloride SA (KLOR-CON M) 20 MEQ tablet Take 1 tablet (20 mEq total) by mouth daily. 30 tablet 1   Spacer/Aero-Holding Chambers (AEROCHAMBER MV) inhaler Use as instructed 1 each 0   VENTOLIN HFA 108 (90 Base) MCG/ACT inhaler inhale 2 PUFFS into THE lungs EVERY 6 HOURS AS NEEDED FOR WHEEZING OR SHORTNESS OF BREATH 18 g 11   vortioxetine HBr (TRINTELLIX) 10 MG TABS tablet Take 1 tablet (10 mg total) by mouth daily. 90 tablet 0   No facility-administered medications prior to visit.     Review of Systems:   Constitutional: No weight loss or gain, night sweats, fevers, chills, or lassitude. HEENT: No headaches, difficulty swallowing, tooth/dental problems, or sore throat. No sneezing, itching, ear ache, nasal congestion, or post nasal drip CV:  +swelling in lower extremities, PND, orthopnea. No chest pain, anasarca, dizziness, palpitations, syncope Resp: +shortness of breath with exertion; productive cough; wheezing. No hemoptysis. No chest wall deformity GI:  No heartburn, indigestion, abdominal pain, nausea, vomiting, diarrhea, change in bowel habits, loss of appetite, bloody stools.  GU: No dysuria, change in color of urine,  urgency or frequency.  No flank pain, no hematuria  Skin: No rash, lesions, ulcerations MSK:  No joint pain or swelling.  Neuro: No dizziness or lightheadedness. +memory impairment (baseline) Psych: No depression or anxiety. Mood stable.     Physical Exam:  BP 109/69 (BP Location: Right Arm, Patient Position: Sitting, Cuff Size: Large)   Pulse 89   Temp (!) 96.9 F (36.1 C) (Temporal)   Ht 5\' 5"  (1.651 m)   Wt (!) 312 lb 12.8 oz (  141.9 kg)   LMP 05/29/2017 (Approximate)   SpO2 94%   BMI 52.05 kg/m   GEN: Pleasant, interactive, chronically-ill appearing; morbidly obese; in no acute distress HEENT:  Normocephalic and atraumatic. PERRLA. Sclera white. Nasal turbinates pink, moist and patent bilaterally. No rhinorrhea present. Oropharynx pink and moist, without exudate or edema. No lesions, ulcerations, or postnasal drip.  NECK:  Supple w/ fair ROM. No JVD present. Normal carotid impulses w/o bruits. Thyroid symmetrical with no goiter or nodules palpated. No lymphadenopathy.   CV: RRR, no m/r/g,+1 BLE edema. Pulses intact, +2 bilaterally. No cyanosis, pallor or clubbing. PULMONARY:  Unlabored, regular breathing. End expiratory wheeze bilaterally A&P. No accessory muscle use.  GI: BS present and normoactive. Soft, non-tender to palpation. No organomegaly or masses detected. MSK: No erythema, warmth or tenderness. Cap refil <2 sec all extrem. No deformities or joint swelling noted.  Neuro: A/Ox3. No focal deficits noted.   Skin: Warm, no lesions or rashe Psych: Normal affect and behavior. Judgement and thought content appropriate.     Lab Results:  CBC    Component Value Date/Time   WBC 11.0 (H) 10/24/2022 0859   RBC 5.10 10/24/2022 0859   HGB 14.5 10/24/2022 0859   HGB 13.3 10/30/2018 0930   HCT 44.9 10/24/2022 0859   HCT 41.7 10/30/2018 0930   PLT 443 (H) 10/24/2022 0859   PLT 350 10/30/2018 0930   MCV 88.0 10/24/2022 0859   MCV 86 10/30/2018 0930   MCH 28.4 10/24/2022  0859   MCHC 32.3 10/24/2022 0859   RDW 14.7 10/24/2022 0859   RDW 15.9 (H) 10/30/2018 0930   LYMPHSABS 3.5 10/24/2022 0859   LYMPHSABS 2.5 10/30/2018 0930   MONOABS 0.8 10/24/2022 0859   EOSABS 0.1 10/24/2022 0859   EOSABS 0.1 10/30/2018 0930   BASOSABS 0.0 10/24/2022 0859   BASOSABS 0.0 10/30/2018 0930    BMET    Component Value Date/Time   NA 138 10/24/2022 0859   NA 142 06/29/2021 0857   K 3.4 (L) 10/24/2022 0859   CL 100 10/24/2022 0859   CO2 28 10/24/2022 0859   GLUCOSE 97 10/24/2022 0859   BUN 6 10/24/2022 0859   BUN 5 (L) 06/29/2021 0857   CREATININE 0.54 10/24/2022 0859   CALCIUM 9.2 10/24/2022 0859   GFRNONAA >60 10/24/2022 0859   GFRAA >60 09/27/2019 0447    BNP    Component Value Date/Time   BNP 19.8 10/24/2022 0859     Imaging:  No results found.  Administration History     None          Latest Ref Rng & Units 04/17/2020    8:56 AM  PFT Results  FVC-Pre L 2.05   FVC-Predicted Pre % 68   FVC-Post L 1.97   FVC-Predicted Post % 65   Pre FEV1/FVC % % 88   Post FEV1/FCV % % 91   FEV1-Pre L 1.80   FEV1-Predicted Pre % 75   FEV1-Post L 1.79   DLCO uncorrected ml/min/mmHg 12.65   DLCO UNC% % 58   DLCO corrected ml/min/mmHg 13.11   DLCO COR %Predicted % 60   DLVA Predicted % 88   TLC L 3.54   TLC % Predicted % 68   RV % Predicted % 68     No results found for: "NITRICOXIDE"      Assessment & Plan:      Chronic bronchitis  No formal obstruction on previous PFT from 2022; however, with continued heavy smoking, suspicion for  development of smoking related obstructive lung disease. Plan to repeat PFT. She has worsening dyspnea, increased cough with white sputum. Symptoms progressively worsening since ED visit last month. Will treated her for acute exacerbation with steroids and empiric doxycycline. Trial step up in scheduled inhaler therapy to [facility] inhaler. Side effect profile reviewed. Educated on proper use. CXR today to rule out  superimposed infection. Action plan in place. Lower suspicion for PE based on Wells Criteria.  - Order chest x-ray - Prescribe Breztri inhaler 2 puffs twice daily. Brush tongue and rinse mouth afterwards - Discontinue Symbicort - Prescribe prednisone 40 mg for 5 days - Prescribe doxycycline for 7 days, twice daily - Mucociliary clearance therapies  - Order pulmonary function testing - Smoking cessation advised  - Requalify for oxygen therapy - Schedule follow-up in 6 weeks  Leg edema/CHF Increased leg swelling, concern for HF or PH. BNP 09/2022 negative. Echo ordered for further evaluation.  - Echocardiogram ordered  Current smoker/Smoking cessation Desire to quit smoking, reduced intake from a pack/day to eight cigarettes/day. Provided smoking cessation support and resources. - Provide smoking cessation support and resources   Chronic respiratory failure with hypoxia Stable without increased O2 requirement. Resting SpO2 on room air 92%. Desaturation to SpO2 low 88% on room air. Supplemental O2 applied 3 lpm POC with improvement >90%. She requires 3 lpm POC with exertion. Re-qualified today. Goal >88-90% -Continue supplemental oxygen 3 lpm POC and 2 lpm with CPAP   OSA OSA/OHS on CPAP. Download reveals excellent compliance and control. She is having some large leaks but not impacting residual AHI. Nocturnal symptoms do not seem to be related to untreated OSA. Suspect possible cardiac component. See above. Cautioned on safe driving practices. Healthy weight loss encouraged.  - Continue CPAP nightly with 2 lpm supplemental O2 bled through  - Proper care/maintenance of CPAP reviewed  General Health Maintenance Eligible for lung cancer screening due to significant smoking history. Enrolled in lung cancer screening program with yearly CT scans. - Enroll in lung cancer screening program with yearly CT scans  Follow-up - Schedule follow-up appointment in 6 weeks.        Advised if  symptoms do not improve or worsen, to please contact office for sooner follow up or seek emergency care.   I spent 45 minutes of dedicated to the care of this patient on the date of this encounter to include pre-visit review of records, face-to-face time with the patient discussing conditions above, post visit ordering of testing, clinical documentation with the electronic health record, making appropriate referrals as documented, and communicating necessary findings to members of the patients care team.  Noemi Chapel, NP 12/08/2022  Pt aware and understands NP's role.

## 2022-12-08 NOTE — Patient Instructions (Addendum)
Stop Symbicort. Trial step up to Breztri 2 puffs Twice daily. Brush tongue and rinse mouth afterwards.  Continue Albuterol inhaler 2 puffs every 6 hours as needed for shortness of breath or wheezing. Notify if symptoms persist despite rescue inhaler/neb use.  Continue supplemental oxygen 3 lpm on POC with activity and 2 lpm at night with CPAP. Goal >88-90% Continue CPAP nightly, minimum of 4-6 hours a night  Prednisone 40 mg daily for 5 days. Take in AM with food Doxycycline 1 tab Twice daily for 7 days. Take with food. Wear sunscreen when you go outside as this can increase your risk for hyperpigmentation or burns  Guaifenesin [phone] mg Twice daily for cough/congestion   Chest x ray today  Echocardiogram ordered - someone will contact you for scheduling  Follow up in 6 weeks after PFT with Dr. Wynona Neat or Philis Nettle. If symptoms do not improve or worsen, please contact office for sooner follow up or seek emergency care.

## 2022-12-27 ENCOUNTER — Other Ambulatory Visit: Payer: Self-pay | Admitting: Internal Medicine

## 2022-12-28 NOTE — Progress Notes (Unsigned)
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   Increase duloxetine to 60mg  daily.  If no improvement in 2 months, change to oxcarbazepine 150mg  twice daily.  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  ***  For scalp neuralgia, gabapentin was increased to 900mg  TID.  Scalp pain in episodic, lasting 20 minutes 3 times a day.  However, she gets a paroxysmal shooting pain from the left eye up into center of head lasting 5 seconds.  Occurs 3 times a day.   Underwent neuropsychological evaluation on 11/10/2022 suggested that her cognitive dysfunction likely is due to her longstanding intellectual weaknesses exacerbated by her stroke, frequent headaches, sleep dysfunction and psychiatric history, but not due to an underlying neurodegenerative disease.     Current NSAIDS:  none Current analgesics:  none Current anti-anxiolytic:  buspirone Current Antihypertensive medications:  Amlodipine, lisinopril, HCTZ Current Antidepressant/antipsychotic medications: none Current Anticonvulsant medications:  Gabapentin 900mg  three times daily, Depakote ER 500mg  daily     HISTORY:  She developed headaches following hospitalization in late August 2021 for lithium toxicity presenting with  confusion and headaches.  CT of head personally reviewed showed partially empty sella and known old left basal ganglia infarct but no acute intracranial abnormality.  She reports that the headaches have not improved since treatment.  She gets the headaches daily, about 2 hours a day.  It feels like that her entire top of head (in cap distribution) is being stabbed with hot pokers.  She also endorses a hot pressure sticking her in her left eye.  Also a pressure on top of head.  Scalp pain in episodic, lasting 20 minutes 3 times a day.  However, she gets a paroxysmal shooting pain from the left eye up into center of head lasting 5 seconds.  She reports associated drooling off of the left side of her mouth.  Separate from the headaches, she reports that she has periodic blurred vision.  She also zones out from time to time.  She has increased difficulty speaking and slurs her speech.  She has trouble just functioning and performing physical tasks.  MRI of brain without contrast on 11/05/2019 showed nonspecific chronic scattered white matter changes with chronic left basal ganglia infarcts and remote pontine microhemorrhage   Her husband is concerned about her memory.  She easily forgets conversations.  She has had memory deficits for some time. Sometimes has trouble getting words out.  No known family history of Alzheimer's.  Labs included negative HIV, negative RPR, and B12 245. Advised to start B12 daily.  Repeat level was 512.  MRI of brain on 08/20/2021 personally reviewd revealed mild chronic small vessel ischemic changes within the cerebral white matter and pons with chronic infarct within the posterior right lentiform nucleus and left corona radiata and basal ganglia/internal capsule, stable compared to prior MRI from 08/05/2019.       Past  NSAIDS:  Ibuprofen, meloxicam Past analgesics:  tramadol Past antidepressant medications:  Nortriptyline, duloxetine, Fluoxetine, citalopram, mirtazepine,  Sertraline 200mg , [name]  Past anticonvulsant medications:  topiramate 50mg   PAST MEDICAL HISTORY: 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   Chronic diastolic CHF (congestive heart failure) 07/29/2021   Class 3 obesity with alveolar hypoventilation, serious comorbidity, and body mass index (BMI) of 50.0 to 59.9 in adult 06/12/2020   CVA (cerebrovascular accident) 2017   2017 MRI - chronic left lenticulostriate territory hemorrhage or hemorrhagic infarct, with hemosiderin, and encephalomalacia affecting both the caudate and lentiform nucleus     Decreased mobility 06/15/2021   DOE (dyspnea on exertion) 06/12/2020   Essential hypertension 12/18/2014   Fibroid uterus 12/18/2014   Heart murmur, systolic 06/29/2021   History of alcohol abuse 02/16/2017   History of cocaine use 02/16/2017   History of latent syphilis 02/16/2017   Hyperlipidemia associated with type 2 diabetes mellitus 11/07/2020   Incontinence of urine    Lacunar infarction    MRI - chronic lacunar infarct within the posterior right lentiform nucleus.     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/2017   PTSD (post-traumatic stress disorder)    Pulmonary emphysema 08/31/2021   QT prolongation 06/29/2021   Right sided weakness    Social anxiety disorder 11/03/2017   Syncope 02/07/2015   TIA (transient ischemic attack)    Tobacco use 04/14/2015   Type 2 diabetes mellitus 10/21/2017   Venous stasis  dermatitis of both lower extremities 06/15/2021    MEDICATIONS: Current Outpatient Medications on File Prior to Visit  Medication Sig Dispense Refill   amLODipine (NORVASC) 10 MG tablet Take 10 mg by mouth daily.     atorvastatin (LIPITOR) 20 MG tablet TAKE 1 TABLET BY MOUTH EVERY DAY 90 tablet 3   benzonatate (TESSALON) 100 MG capsule Take 1 capsule (100 mg total) by mouth every 8 (eight) hours. 21 capsule 0   Budeson-Glycopyrrol-Formoterol (BREZTRI AEROSPHERE) 160-9-4.8 MCG/ACT AERO Inhale 2 puffs into the lungs in the morning and at bedtime. 10.7 g 11   Budeson-Glycopyrrol-Formoterol (BREZTRI AEROSPHERE) 160-9-4.8 MCG/ACT AERO Inhale 2 puffs into the lungs in the morning and at bedtime. 2 each 0   dicyclomine (BENTYL) 20 MG tablet Take 1 tablet (20 mg total) by mouth 3 (three) times daily as needed. 20 tablet 0   empagliflozin (JARDIANCE) 10 MG TABS tablet Take 1 tablet (10 mg total) by mouth daily. 30 tablet 1   furosemide (LASIX) 80 MG tablet TAKE 1 TABLET BY MOUTH EVERY DAY 90 tablet 0   gabapentin (NEURONTIN) 300 MG capsule TAKE 3 CAPSULES BY MOUTH 3 TIMES DAILY 270 capsule 1   lisinopril (ZESTRIL) 20 MG tablet Take 20 mg by mouth 2 (two) times daily.     losartan (COZAAR) 25 MG tablet Take 1 tablet (25 mg total) by mouth daily. 30 tablet 1   meloxicam (MOBIC) 15 MG tablet Take 15 mg by mouth daily.  metFORMIN (GLUCOPHAGE) 500 MG tablet Take 500 mg by mouth 2 (two) times daily.  3   nicotine (NICODERM CQ - DOSED IN MG/24 HOURS) 14 mg/24hr patch Place 1 patch (14 mg total) onto the skin daily. 28 patch 0   ondansetron (ZOFRAN-ODT) 4 MG disintegrating tablet Take 1 tablet (4 mg total) by mouth every 8 (eight) hours as needed. 20 tablet 0   Polyethylene Glycol 3350 (PEG 3350) 17 GM/SCOOP POWD Take 17 g by mouth daily as needed (constipation).     potassium chloride SA (KLOR-CON M) 20 MEQ tablet Take 1 tablet (20 mEq total) by mouth daily. 30 tablet 1   Spacer/Aero-Holding Chambers  (AEROCHAMBER MV) inhaler Use as instructed 1 each 0   VENTOLIN HFA 108 (90 Base) MCG/ACT inhaler inhale 2 PUFFS into THE lungs EVERY 6 HOURS AS NEEDED FOR WHEEZING OR SHORTNESS OF BREATH 18 g 11   vortioxetine HBr (TRINTELLIX) 10 MG TABS tablet Take 1 tablet (10 mg total) by mouth daily. 90 tablet 0   No current facility-administered medications on file prior to visit.    ALLERGIES: Allergies  Allergen Reactions   Gadolinium Derivatives Nausea And Vomiting    Nausea and vomiting was followed by epileptic seizure episode that lasted approximately 5 minutes; pt was unable to verbally communicate during that time; she then came to and was able to speak; was evaluated by Rad and RN; kms    Grapefruit Flavor [Flavoring Agent] Other (See Comments)    Drug interaction    FAMILY HISTORY: Family History  Problem Relation Age of Onset   Stroke Mother    Anxiety disorder Mother    Drug abuse Mother    Alcohol abuse Mother    Depression Mother    Drug abuse Father    Anxiety disorder Father    Heart disease Maternal Grandmother    Colon cancer Neg Hx    Esophageal cancer Neg Hx    Pancreatic cancer Neg Hx    Stomach cancer Neg Hx    Liver disease Neg Hx    Breast cancer Neg Hx       Objective:  *** General: No acute distress.  Patient appears well-groomed.   Head:  Normocephalic/atraumatic Eyes:  Fundi examined but not visualized Neck: supple, no paraspinal tenderness, full range of motion Heart:  Regular rate and rhythm Neurological Exam: ***   [name], DO

## 2022-12-29 ENCOUNTER — Ambulatory Visit: Payer: Medicaid Other | Admitting: Neurology

## 2022-12-31 NOTE — Progress Notes (Unsigned)
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  It was discontinued by another provider because it was ineffective.  Scalp pain in episodic, lasting 20 minutes 3 times a day.  However, she gets a paroxysmal shooting pain from the left eye up into center of head lasting 5 seconds.  Occurs 3 times a day.   Underwent neuropsychological evaluation on 11/10/2022 suggested that her cognitive dysfunction likely is due to her longstanding intellectual weaknesses exacerbated by cerebrovascular disease, frequent headaches, sleep dysfunction and psychiatric history, but not due to an underlying neurodegenerative disease.     Current NSAIDS:  none Current analgesics:  none Current anti-emetic:  Zofran Current Antihypertensive medications:  Lisinopril, losartan, furosemide Current Antidepressant/antipsychotic medications: Trintellix Current Anticonvulsant medications:  Gabapentin 900mg  three times daily  She is using the CPAP. Still smokes but using the patch.    CBC and CMP from 10/24/2022 reviewed.     HISTORY:  She  developed headaches following hospitalization in late August 2021 for lithium toxicity presenting with confusion and headaches.  CT of head personally reviewed showed partially empty sella and known old left basal ganglia infarct but no acute intracranial abnormality.  She reports that the headaches have not improved since treatment.  She gets the headaches daily, about 2 hours a day.  It feels like that her entire top of head (in cap distribution) is being stabbed with hot pokers.  She also endorses a hot pressure sticking her in her left eye.  Also a pressure on top of head.  Scalp pain in episodic, lasting 20 minutes 3 times a day.  However, she gets a paroxysmal shooting pain from the left eye up into center of head lasting 5 seconds.  She reports associated drooling off of the left side of her mouth.  Separate from the headaches, she reports that she has periodic blurred vision.  She also zones out from time to time.  She has increased difficulty speaking and slurs her speech.  She has trouble just functioning and performing physical tasks.  MRI of brain without contrast on 11/05/2019 showed nonspecific chronic scattered white matter changes with chronic left basal ganglia infarcts and remote pontine microhemorrhage   Her husband is concerned about her memory.  She easily forgets conversations.  She has had memory deficits for some time. Sometimes has trouble getting words out.  No known family history of Alzheimer's.  Labs included negative HIV, negative RPR, and B12 245. Advised to start B12 daily.  Repeat level was 512.  MRI of brain on 08/20/2021 personally reviewd revealed mild chronic small vessel ischemic changes within the cerebral white matter and pons with chronic infarct within the posterior right lentiform nucleus and left corona radiata and basal ganglia/internal capsule, stable compared  to prior MRI from 08/05/2019.       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   Chronic diastolic CHF (congestive heart failure) 07/29/2021   Class 3 obesity with alveolar hypoventilation, serious comorbidity, and body mass index (BMI) of 50.0 to 59.9 in adult 06/12/2020   CVA (cerebrovascular accident) 2017   2017 MRI - chronic left lenticulostriate territory hemorrhage or hemorrhagic infarct, with hemosiderin, and encephalomalacia affecting both the caudate and lentiform nucleus     Decreased mobility 06/15/2021   DOE (dyspnea on exertion) 06/12/2020   Essential hypertension 12/18/2014   Fibroid uterus 12/18/2014   Heart murmur, systolic 06/29/2021   History of alcohol abuse 02/16/2017   History of cocaine use 02/16/2017   History of latent syphilis 02/16/2017   Hyperlipidemia associated with type 2 diabetes mellitus 11/07/2020   Incontinence of urine    Lacunar infarction    MRI - chronic lacunar infarct within the posterior right lentiform nucleus.     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/2017   PTSD (post-traumatic stress disorder)    Pulmonary emphysema 08/31/2021   QT prolongation 06/29/2021   Right sided weakness    Social  anxiety disorder 11/03/2017   Syncope 02/07/2015   TIA (transient ischemic attack)    Tobacco use 04/14/2015   Type 2 diabetes mellitus 10/21/2017   Venous stasis dermatitis of both lower extremities 06/15/2021    MEDICATIONS: Current Outpatient Medications on File Prior to Visit  Medication Sig Dispense Refill   amLODipine (NORVASC) 10 MG tablet Take 10 mg by mouth daily.     atorvastatin (LIPITOR) 20 MG tablet TAKE 1 TABLET BY MOUTH EVERY DAY 90 tablet 3   benzonatate (TESSALON) 100 MG capsule Take 1 capsule (100 mg total) by mouth every 8 (eight) hours. 21 capsule 0   Budeson-Glycopyrrol-Formoterol (BREZTRI AEROSPHERE) 160-9-4.8 MCG/ACT AERO Inhale 2 puffs into the lungs in the morning and at bedtime. 10.7 g 11   Budeson-Glycopyrrol-Formoterol (BREZTRI AEROSPHERE) 160-9-4.8 MCG/ACT AERO Inhale 2 puffs into the lungs in the morning and at bedtime. 2 each 0   dicyclomine (BENTYL) 20 MG tablet Take 1 tablet (20 mg total) by mouth 3 (three) times daily as needed. 20 tablet 0   empagliflozin (JARDIANCE) 10 MG TABS tablet Take 1 tablet (10 mg total) by mouth daily. 30 tablet 1   furosemide (LASIX) 80 MG tablet TAKE 1 TABLET BY MOUTH EVERY DAY 90 tablet 2   gabapentin (NEURONTIN) 300 MG capsule TAKE 3 CAPSULES BY MOUTH 3 TIMES DAILY 270 capsule 1   lisinopril (ZESTRIL) 20 MG tablet Take 20 mg by mouth 2 (two) times daily.     losartan (COZAAR) 25 MG tablet Take 1 tablet (25 mg total)  by mouth daily. 30 tablet 1   meloxicam (MOBIC) 15 MG tablet Take 15 mg by mouth daily.     metFORMIN (GLUCOPHAGE) 500 MG tablet Take 500 mg by mouth 2 (two) times daily.  3   nicotine (NICODERM CQ - DOSED IN MG/24 HOURS) 14 mg/24hr patch Place 1 patch (14 mg total) onto the skin daily. 28 patch 0   ondansetron (ZOFRAN-ODT) 4 MG disintegrating tablet Take 1 tablet (4 mg total) by mouth every 8 (eight) hours as needed. 20 tablet 0   Polyethylene Glycol 3350 (PEG 3350) 17 GM/SCOOP POWD Take 17 g by mouth daily as  needed (constipation).     potassium chloride SA (KLOR-CON M) 20 MEQ tablet Take 1 tablet (20 mEq total) by mouth daily. 30 tablet 1   Spacer/Aero-Holding Chambers (AEROCHAMBER MV) inhaler Use as instructed 1 each 0   VENTOLIN HFA 108 (90 Base) MCG/ACT inhaler inhale 2 PUFFS into THE lungs EVERY 6 HOURS AS NEEDED FOR WHEEZING OR SHORTNESS OF BREATH 18 g 11   vortioxetine HBr (TRINTELLIX) 10 MG TABS tablet Take 1 tablet (10 mg total) by mouth daily. 90 tablet 0   No current facility-administered medications on file prior to visit.    ALLERGIES: Allergies  Allergen Reactions   Gadolinium Derivatives Nausea And Vomiting    Nausea and vomiting was followed by epileptic seizure episode that lasted approximately 5 minutes; pt was unable to verbally communicate during that time; she then came to and was able to speak; was evaluated by Rad and RN; kms    Grapefruit Flavor [Flavoring Agent] Other (See Comments)    Drug interaction    FAMILY HISTORY: Family History  Problem Relation Age of Onset   Stroke Mother    Anxiety disorder Mother    Drug abuse Mother    Alcohol abuse Mother    Depression Mother    Drug abuse Father    Anxiety disorder Father    Heart disease Maternal Grandmother    Colon cancer Neg Hx    Esophageal cancer Neg Hx    Pancreatic cancer Neg Hx    Stomach cancer Neg Hx    Liver disease Neg Hx    Breast cancer Neg Hx       Objective:  Blood pressure 133/78, pulse (!) 106, height 5\' 5"  (1.651 m), weight (!) 311 lb 6.4 oz (141.3 kg), last menstrual period 05/29/2017, SpO2 93%. General: No acute distress.  Patient appears well-groomed.      [name], DO

## 2023-01-03 ENCOUNTER — Ambulatory Visit (INDEPENDENT_AMBULATORY_CARE_PROVIDER_SITE_OTHER): Payer: Medicaid Other | Admitting: Neurology

## 2023-01-03 ENCOUNTER — Encounter: Payer: Self-pay | Admitting: Neurology

## 2023-01-03 VITALS — BP 133/78 | HR 106 | Ht 65.0 in | Wt 311.4 lb

## 2023-01-03 DIAGNOSIS — G8929 Other chronic pain: Secondary | ICD-10-CM

## 2023-01-03 DIAGNOSIS — R519 Headache, unspecified: Secondary | ICD-10-CM

## 2023-01-03 DIAGNOSIS — M792 Neuralgia and neuritis, unspecified: Secondary | ICD-10-CM | POA: Diagnosis not present

## 2023-01-03 DIAGNOSIS — G500 Trigeminal neuralgia: Secondary | ICD-10-CM

## 2023-01-03 DIAGNOSIS — F09 Unspecified mental disorder due to known physiological condition: Secondary | ICD-10-CM

## 2023-01-03 MED ORDER — CARBAMAZEPINE 200 MG PO TABS: 200.0000 mg | ORAL_TABLET | Freq: Two times a day (BID) | ORAL | 5 refills | Status: DC

## 2023-01-03 NOTE — Patient Instructions (Signed)
Start carbamazepine 200mg  twice daily Continue gabapentin 900mg  three times daily Follow up 5 months.

## 2023-01-04 ENCOUNTER — Other Ambulatory Visit (HOSPITAL_COMMUNITY): Payer: Self-pay

## 2023-01-04 ENCOUNTER — Telehealth: Payer: Self-pay

## 2023-01-04 NOTE — Telephone Encounter (Signed)
Pharmacy Patient Advocate Encounter   Received notification from CoverMyMeds that prior authorization for Breztri Aerosphere 160-9-4.8MCG/ACT aerosol is required/requested.   Insurance verification completed.   The patient is insured through [facility] .   Per test claim: PA required; PA submitted to above mentioned insurance via CoverMyMeds Key/confirmation #/EOC [id number] Status is pending

## 2023-01-10 NOTE — Telephone Encounter (Signed)
Pharmacy Patient Advocate Encounter  Received notification from [facility] that Prior Authorization for [facility] Aerosphere 160-9-4.8MCG/ACT aerosol has been APPROVED from 01-04-2023 to 01-04-2024   PA #/Case ID/Reference #: [id number]

## 2023-01-11 ENCOUNTER — Other Ambulatory Visit: Payer: Self-pay | Admitting: Neurology

## 2023-01-12 ENCOUNTER — Telehealth: Payer: Self-pay | Admitting: Neurology

## 2023-01-12 ENCOUNTER — Telehealth: Payer: Self-pay

## 2023-01-12 ENCOUNTER — Other Ambulatory Visit: Payer: Self-pay

## 2023-01-12 MED ORDER — GABAPENTIN 300 MG PO CAPS: ORAL_CAPSULE | ORAL | 4 refills | Status: DC

## 2023-01-12 NOTE — Telephone Encounter (Signed)
1. Which medications need to be refilled? (please list name of each medication and dose if known) GABAPENTIN  2. Which pharmacy/location (including street and city if local pharmacy) is medication to be sent to? [facility] [id number]  3. Do they need a 30 day or 90 day supply?

## 2023-01-12 NOTE — Telephone Encounter (Signed)
Called Patient and told her to call pharmacy because she has another refill on her gabapentin. She said she would. Told her to call back if needed. She understood.

## 2023-01-12 NOTE — Telephone Encounter (Signed)
Called pt and let message on voice mail that I made a mistake on the count of tablets and a new order was sent in.

## 2023-01-12 NOTE — Telephone Encounter (Signed)
Called Pharmacy and they did receive prescription and are filling it for the pt . Called pt and made her aware of this. She understood.

## 2023-01-12 NOTE — Telephone Encounter (Signed)
Pt left a message. She stated the pharmacy told her there was only refills through October so more refills need to be sent in.

## 2023-01-13 ENCOUNTER — Encounter: Payer: Self-pay | Admitting: Neurology

## 2023-01-14 ENCOUNTER — Telehealth: Payer: Self-pay | Admitting: Neurology

## 2023-01-14 NOTE — Telephone Encounter (Signed)
Left message with the after hour service on 01-14-23 at 12:47 pm   Caller states that she is calling from [facility] and this is regarding a form that was faxed over for personal care service faxed on 01-04-23

## 2023-01-17 NOTE — Telephone Encounter (Signed)
Retrned call, Need form for review. Please resend the form.

## 2023-01-18 ENCOUNTER — Ambulatory Visit (HOSPITAL_COMMUNITY): Payer: Medicaid Other | Attending: Cardiology

## 2023-01-18 DIAGNOSIS — R0609 Other forms of dyspnea: Secondary | ICD-10-CM

## 2023-01-18 LAB — ECHOCARDIOGRAM COMPLETE
Area-P 1/2: 3.62 cm2
P 1/2 time: 367 ms
S' Lateral: 3 cm

## 2023-01-20 NOTE — Telephone Encounter (Signed)
Form not filled out by [facility] before. Patient's PCP filled it out in the past.

## 2023-02-04 ENCOUNTER — Telehealth: Payer: Self-pay

## 2023-02-04 NOTE — Telephone Encounter (Signed)
 Spoke with patient to screen for eligibility for LDCT.  Patient states she 'has never smoked more than 3 cigarettes per day' but she smokes little cigars. Cigars are not included in LDCT pack years criteria.  Due to low cigarette smoking history, patient does not meet criteria for LDCT. Patient is interested in having a CT of the chest/lungs, if provider has alternative recommendation. Routed to referring provider as FYI to advise patient if other imaging may be recommended.  Referral to lung cancer screening is closed

## 2023-02-17 ENCOUNTER — Inpatient Hospital Stay (HOSPITAL_COMMUNITY)
Admission: EM | Admit: 2023-02-17 | Discharge: 2023-02-20 | DRG: 193 | Disposition: A | Discharge status: Home / Self Care | Payer: Medicaid Other | Attending: Internal Medicine | Admitting: Internal Medicine

## 2023-02-17 ENCOUNTER — Other Ambulatory Visit: Payer: Self-pay

## 2023-02-17 ENCOUNTER — Emergency Department (HOSPITAL_COMMUNITY): Payer: Medicaid Other

## 2023-02-17 ENCOUNTER — Encounter (HOSPITAL_COMMUNITY): Payer: Self-pay

## 2023-02-17 DIAGNOSIS — F32A Depression, unspecified: Secondary | ICD-10-CM | POA: Diagnosis present

## 2023-02-17 DIAGNOSIS — F09 Unspecified mental disorder due to known physiological condition: Secondary | ICD-10-CM | POA: Diagnosis present

## 2023-02-17 DIAGNOSIS — Z791 Long term (current) use of non-steroidal anti-inflammatories (NSAID): Secondary | ICD-10-CM

## 2023-02-17 DIAGNOSIS — H40053 Ocular hypertension, bilateral: Secondary | ICD-10-CM | POA: Diagnosis present

## 2023-02-17 DIAGNOSIS — E785 Hyperlipidemia, unspecified: Secondary | ICD-10-CM | POA: Diagnosis present

## 2023-02-17 DIAGNOSIS — Z6841 Body Mass Index (BMI) 40.0 and over, adult: Secondary | ICD-10-CM

## 2023-02-17 DIAGNOSIS — Z7951 Long term (current) use of inhaled steroids: Secondary | ICD-10-CM

## 2023-02-17 DIAGNOSIS — J189 Pneumonia, unspecified organism: Secondary | ICD-10-CM | POA: Diagnosis present

## 2023-02-17 DIAGNOSIS — I110 Hypertensive heart disease with heart failure: Secondary | ICD-10-CM | POA: Diagnosis present

## 2023-02-17 DIAGNOSIS — J440 Chronic obstructive pulmonary disease with acute lower respiratory infection: Secondary | ICD-10-CM | POA: Diagnosis present

## 2023-02-17 DIAGNOSIS — Z8249 Family history of ischemic heart disease and other diseases of the circulatory system: Secondary | ICD-10-CM

## 2023-02-17 DIAGNOSIS — Z818 Family history of other mental and behavioral disorders: Secondary | ICD-10-CM

## 2023-02-17 DIAGNOSIS — J441 Chronic obstructive pulmonary disease with (acute) exacerbation: Secondary | ICD-10-CM | POA: Diagnosis not present

## 2023-02-17 DIAGNOSIS — E662 Morbid (severe) obesity with alveolar hypoventilation: Secondary | ICD-10-CM | POA: Diagnosis present

## 2023-02-17 DIAGNOSIS — E66813 Obesity, class 3: Secondary | ICD-10-CM | POA: Diagnosis present

## 2023-02-17 DIAGNOSIS — F17210 Nicotine dependence, cigarettes, uncomplicated: Secondary | ICD-10-CM | POA: Diagnosis present

## 2023-02-17 DIAGNOSIS — J432 Centrilobular emphysema: Secondary | ICD-10-CM

## 2023-02-17 DIAGNOSIS — J180 Bronchopneumonia, unspecified organism: Principal | ICD-10-CM

## 2023-02-17 DIAGNOSIS — E1169 Type 2 diabetes mellitus with other specified complication: Secondary | ICD-10-CM | POA: Diagnosis present

## 2023-02-17 DIAGNOSIS — I5032 Chronic diastolic (congestive) heart failure: Secondary | ICD-10-CM | POA: Diagnosis present

## 2023-02-17 DIAGNOSIS — J9621 Acute and chronic respiratory failure with hypoxia: Secondary | ICD-10-CM | POA: Diagnosis present

## 2023-02-17 DIAGNOSIS — Z8673 Personal history of transient ischemic attack (TIA), and cerebral infarction without residual deficits: Secondary | ICD-10-CM

## 2023-02-17 DIAGNOSIS — F4310 Post-traumatic stress disorder, unspecified: Secondary | ICD-10-CM | POA: Diagnosis present

## 2023-02-17 DIAGNOSIS — Z888 Allergy status to other drugs, medicaments and biological substances status: Secondary | ICD-10-CM

## 2023-02-17 DIAGNOSIS — B948 Sequelae of other specified infectious and parasitic diseases: Secondary | ICD-10-CM

## 2023-02-17 DIAGNOSIS — Z716 Tobacco abuse counseling: Secondary | ICD-10-CM

## 2023-02-17 DIAGNOSIS — Z7984 Long term (current) use of oral hypoglycemic drugs: Secondary | ICD-10-CM

## 2023-02-17 DIAGNOSIS — J439 Emphysema, unspecified: Secondary | ICD-10-CM | POA: Diagnosis present

## 2023-02-17 DIAGNOSIS — F419 Anxiety disorder, unspecified: Secondary | ICD-10-CM | POA: Diagnosis present

## 2023-02-17 DIAGNOSIS — Z823 Family history of stroke: Secondary | ICD-10-CM

## 2023-02-17 DIAGNOSIS — G500 Trigeminal neuralgia: Secondary | ICD-10-CM | POA: Diagnosis present

## 2023-02-17 DIAGNOSIS — Z79899 Other long term (current) drug therapy: Secondary | ICD-10-CM

## 2023-02-17 DIAGNOSIS — Z1152 Encounter for screening for COVID-19: Secondary | ICD-10-CM

## 2023-02-17 LAB — BRAIN NATRIURETIC PEPTIDE: B Natriuretic Peptide: 37.3 pg/mL (ref 0.0–100.0)

## 2023-02-17 LAB — EXPECTORATED SPUTUM ASSESSMENT W GRAM STAIN, RFLX TO RESP C

## 2023-02-17 LAB — CBC WITH DIFFERENTIAL/PLATELET
Abs Immature Granulocytes: 0.06 10*3/uL (ref 0.00–0.07)
Basophils Absolute: 0.1 10*3/uL (ref 0.0–0.1)
Basophils Relative: 0 %
Eosinophils Absolute: 0.2 10*3/uL (ref 0.0–0.5)
Eosinophils Relative: 1 %
HCT: 45.9 % (ref 36.0–46.0)
Hemoglobin: 14.6 g/dL (ref 12.0–15.0)
Immature Granulocytes: 0 %
Lymphocytes Relative: 23 %
Lymphs Abs: 3.1 10*3/uL (ref 0.7–4.0)
MCH: 28.7 pg (ref 26.0–34.0)
MCHC: 31.8 g/dL (ref 30.0–36.0)
MCV: 90.4 fL (ref 80.0–100.0)
Monocytes Absolute: 1 10*3/uL (ref 0.1–1.0)
Monocytes Relative: 8 %
Neutro Abs: 9.1 10*3/uL — ABNORMAL HIGH (ref 1.7–7.7)
Neutrophils Relative %: 68 %
Platelets: 360 10*3/uL (ref 150–400)
RBC: 5.08 MIL/uL (ref 3.87–5.11)
RDW: 15.5 % (ref 11.5–15.5)
WBC: 13.5 10*3/uL — ABNORMAL HIGH (ref 4.0–10.5)
nRBC: 0 % (ref 0.0–0.2)

## 2023-02-17 LAB — BASIC METABOLIC PANEL
Anion gap: 12 (ref 5–15)
BUN: 7 mg/dL (ref 6–20)
CO2: 28 mmol/L (ref 22–32)
Calcium: 9.4 mg/dL (ref 8.9–10.3)
Chloride: 101 mmol/L (ref 98–111)
Creatinine, Ser: 0.81 mg/dL (ref 0.44–1.00)
GFR, Estimated: >60 mL/min (ref >60)
Glucose, Bld: 83 mg/dL (ref 70–99)
Potassium: 4.7 mmol/L (ref 3.5–5.1)
Sodium: 141 mmol/L (ref 135–145)

## 2023-02-17 LAB — RESPIRATORY PANEL BY PCR

## 2023-02-17 LAB — MRSA NEXT GEN BY PCR, NASAL: MRSA by PCR Next Gen: NOT DETECTED

## 2023-02-17 LAB — RESP PANEL BY RT-PCR (RSV, FLU A&B, COVID)  RVPGX2
Influenza A by PCR: NEGATIVE
Influenza B by PCR: NEGATIVE
Resp Syncytial Virus by PCR: NEGATIVE
SARS Coronavirus 2 by RT PCR: NEGATIVE

## 2023-02-17 LAB — PROCALCITONIN: Procalcitonin: <0.1 ng/mL

## 2023-02-17 MED ORDER — IPRATROPIUM-ALBUTEROL 0.5-2.5 (3) MG/3ML IN SOLN
3.0000 mL | Freq: Once | RESPIRATORY_TRACT | Status: AC
Start: 2023-02-17 — End: 2023-02-17
  Administered 2023-02-17: 3 mL via RESPIRATORY_TRACT
  Filled 2023-02-17: qty 3

## 2023-02-17 MED ORDER — UMECLIDINIUM BROMIDE 62.5 MCG/ACT IN AEPB
1.0000 | INHALATION_SPRAY | Freq: Every day | RESPIRATORY_TRACT | Status: DC
  Administered 2023-02-18 – 2023-02-20 (×3): 1 via RESPIRATORY_TRACT
  Filled 2023-02-17: qty 7

## 2023-02-17 MED ORDER — CARBAMAZEPINE 200 MG PO TABS
200.0000 mg | ORAL_TABLET | Freq: Two times a day (BID) | ORAL | Status: DC
Start: 2023-02-17 — End: 2023-02-20
  Administered 2023-02-17 – 2023-02-20 (×6): 200 mg via ORAL
  Filled 2023-02-17 (×6): qty 1

## 2023-02-17 MED ORDER — MOMETASONE FURO-FORMOTEROL FUM 100-5 MCG/ACT IN AERO
2.0000 | INHALATION_SPRAY | Freq: Two times a day (BID) | RESPIRATORY_TRACT | Status: DC
Start: 2023-02-17 — End: 2023-02-20
  Administered 2023-02-17 – 2023-02-20 (×6): 2 via RESPIRATORY_TRACT
  Filled 2023-02-17: qty 8.8

## 2023-02-17 MED ORDER — ACETAMINOPHEN 650 MG RE SUPP: 650.0000 mg | Freq: Four times a day (QID) | RECTAL | Status: DC | PRN

## 2023-02-17 MED ORDER — FUROSEMIDE 40 MG PO TABS
80.0000 mg | ORAL_TABLET | Freq: Every morning | ORAL | Status: DC
  Administered 2023-02-18 – 2023-02-20 (×3): 80 mg via ORAL
  Filled 2023-02-17 (×3): qty 2

## 2023-02-17 MED ORDER — DM-GUAIFENESIN ER 30-600 MG PO TB12
1.0000 | ORAL_TABLET | Freq: Two times a day (BID) | ORAL | Status: DC
  Administered 2023-02-17 – 2023-02-20 (×6): 1 via ORAL
  Filled 2023-02-17 (×6): qty 1

## 2023-02-17 MED ORDER — DOXYCYCLINE HYCLATE 100 MG PO TABS
100.0000 mg | ORAL_TABLET | Freq: Two times a day (BID) | ORAL | Status: DC
  Administered 2023-02-18 – 2023-02-20 (×5): 100 mg via ORAL
  Filled 2023-02-17 (×5): qty 1

## 2023-02-17 MED ORDER — DOXYCYCLINE HYCLATE 100 MG PO TABS
100.0000 mg | ORAL_TABLET | Freq: Once | ORAL | Status: AC
  Administered 2023-02-17: 100 mg via ORAL
  Filled 2023-02-17: qty 1

## 2023-02-17 MED ORDER — ACETAMINOPHEN 325 MG PO TABS: 650.0000 mg | ORAL_TABLET | Freq: Four times a day (QID) | ORAL | Status: DC | PRN

## 2023-02-17 MED ORDER — ONDANSETRON HCL 4 MG PO TABS: 4.0000 mg | ORAL_TABLET | Freq: Four times a day (QID) | ORAL | Status: DC | PRN

## 2023-02-17 MED ORDER — METHYLPREDNISOLONE SODIUM SUCC 125 MG IJ SOLR
125.0000 mg | Freq: Once | INTRAMUSCULAR | Status: AC
  Administered 2023-02-17: 125 mg via INTRAVENOUS
  Filled 2023-02-17: qty 2

## 2023-02-17 MED ORDER — LOSARTAN POTASSIUM 25 MG PO TABS
25.0000 mg | ORAL_TABLET | Freq: Every day | ORAL | Status: DC
Start: 2023-02-17 — End: 2023-02-20
  Administered 2023-02-17 – 2023-02-20 (×4): 25 mg via ORAL
  Filled 2023-02-17 (×4): qty 1

## 2023-02-17 MED ORDER — ONDANSETRON HCL 4 MG/2ML IJ SOLN: 4.0000 mg | Freq: Four times a day (QID) | INTRAMUSCULAR | Status: DC | PRN

## 2023-02-17 MED ORDER — CEFTRIAXONE SODIUM 1 G IJ SOLR
1.0000 g | INTRAMUSCULAR | Status: DC
  Administered 2023-02-19: 1 g via INTRAVENOUS
  Filled 2023-02-17 (×2): qty 10

## 2023-02-17 MED ORDER — SODIUM CHLORIDE 0.9 % IV SOLN
1.0000 g | Freq: Once | INTRAVENOUS | Status: AC
  Administered 2023-02-17: 1 g via INTRAVENOUS
  Filled 2023-02-17: qty 10

## 2023-02-17 MED ORDER — SENNOSIDES-DOCUSATE SODIUM 8.6-50 MG PO TABS: 1.0000 | ORAL_TABLET | Freq: Every evening | ORAL | Status: DC | PRN

## 2023-02-17 MED ORDER — EMPAGLIFLOZIN 10 MG PO TABS
10.0000 mg | ORAL_TABLET | Freq: Every day | ORAL | Status: DC
  Administered 2023-02-18 – 2023-02-20 (×3): 10 mg via ORAL
  Filled 2023-02-17 (×3): qty 1

## 2023-02-17 MED ORDER — ATORVASTATIN CALCIUM 10 MG PO TABS
20.0000 mg | ORAL_TABLET | Freq: Every day | ORAL | Status: DC
  Administered 2023-02-18 – 2023-02-20 (×3): 20 mg via ORAL
  Filled 2023-02-17 (×3): qty 2

## 2023-02-17 MED ORDER — IPRATROPIUM-ALBUTEROL 0.5-2.5 (3) MG/3ML IN SOLN
3.0000 mL | Freq: Four times a day (QID) | RESPIRATORY_TRACT | Status: DC | PRN
  Administered 2023-02-19: 3 mL via RESPIRATORY_TRACT
  Filled 2023-02-17: qty 3

## 2023-02-17 MED ORDER — METFORMIN HCL 500 MG PO TABS
500.0000 mg | ORAL_TABLET | Freq: Two times a day (BID) | ORAL | Status: DC
Start: 2023-02-18 — End: 2023-02-20
  Administered 2023-02-18 – 2023-02-20 (×5): 500 mg via ORAL
  Filled 2023-02-17 (×5): qty 1

## 2023-02-17 MED ORDER — ENOXAPARIN SODIUM 80 MG/0.8ML IJ SOSY
70.0000 mg | PREFILLED_SYRINGE | Freq: Every day | INTRAMUSCULAR | Status: DC
  Administered 2023-02-17 – 2023-02-19 (×3): 70 mg via SUBCUTANEOUS
  Filled 2023-02-17 (×3): qty 0.8
  Filled 2023-02-17: qty 0.7

## 2023-02-17 MED ORDER — POTASSIUM CHLORIDE CRYS ER 20 MEQ PO TBCR
20.0000 meq | EXTENDED_RELEASE_TABLET | Freq: Every day | ORAL | Status: DC
  Administered 2023-02-18 – 2023-02-20 (×3): 20 meq via ORAL
  Filled 2023-02-17 (×3): qty 1

## 2023-02-17 MED ORDER — ALBUTEROL SULFATE HFA 108 (90 BASE) MCG/ACT IN AERS
2.0000 | INHALATION_SPRAY | Freq: Once | RESPIRATORY_TRACT | Status: AC
  Administered 2023-02-17: 2 via RESPIRATORY_TRACT
  Filled 2023-02-17: qty 6.7

## 2023-02-17 MED ORDER — NICOTINE 14 MG/24HR TD PT24
14.0000 mg | MEDICATED_PATCH | Freq: Every day | TRANSDERMAL | Status: DC
  Administered 2023-02-17 – 2023-02-20 (×4): 14 mg via TRANSDERMAL
  Filled 2023-02-17 (×4): qty 1

## 2023-02-17 MED ORDER — BUDESON-GLYCOPYRROL-FORMOTEROL 160-9-4.8 MCG/ACT IN AERO: 2.0000 | INHALATION_SPRAY | Freq: Two times a day (BID) | RESPIRATORY_TRACT | Status: DC

## 2023-02-17 MED ORDER — GABAPENTIN 300 MG PO CAPS
900.0000 mg | ORAL_CAPSULE | Freq: Three times a day (TID) | ORAL | Status: DC
  Administered 2023-02-17 – 2023-02-20 (×8): 900 mg via ORAL
  Filled 2023-02-17 (×8): qty 3

## 2023-02-17 MED ORDER — PREDNISOLONE 5 MG PO TABS
40.0000 mg | ORAL_TABLET | Freq: Every day | ORAL | Status: DC
  Administered 2023-02-18 – 2023-02-20 (×3): 40 mg via ORAL
  Filled 2023-02-17 (×3): qty 8

## 2023-02-17 NOTE — Plan of Care (Signed)

## 2023-02-17 NOTE — H&P (Signed)
History and Physical  Denise [name] ZOX:[id number] DOB: September 29, 1970 DOA: 02/17/2023  PCP: [facility]   Chief Complaint: URI symptoms, SOB  HPI: Denise [name] is a 53 y.o. female with medical history significant for morbid obesity, diastolic heart failure, CVA, COPD, chronic hypoxic respiratory failure on 2 L [place], T2DM, OSA on CPAP, HTN, HLD, anxiety and depression who presented via EMS for evaluation of URI symptoms.  Patient reports she has had cough and cold symptoms for over a month.  She has had worsening cough for over a week that is productive with dark yellow sputum.  She endorsed occasional wheezing, generalized weakness, headache and dyspnea on exertion as well as rib pain with a constant coughing but denies any chest pain, fevers, chills, nausea, vomiting, dizziness, leg swelling or abdominal pain.  ED Course: Mildly hypertensive with SBP in the 140s but normal vitals with SpO2 93% on 2 L.  Labs significant for mild leukocytosis of 13.5. CXR shows evidence of atypical infection/bronchopneumonia. Patient was given DuoNebs, IV Solu-Medrol 125 mg x 1, albuterol inhaler, IV Rocephin and oral doxycycline. TR[facility] was consulted for admission  Review of Systems: Please see HPI for pertinent positives and negatives. A complete 10 system review of systems are otherwise negative.  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   Chronic diastolic CHF (congestive heart failure) 07/29/2021   Class 3 obesity with alveolar hypoventilation, serious comorbidity, and body mass index (BMI) of 50.0 to 59.9 in adult 06/12/2020   CVA (cerebrovascular accident) 2017   2017 MRI - chronic left lenticulostriate territory hemorrhage or hemorrhagic infarct, with hemosiderin,  and encephalomalacia affecting both the caudate and lentiform nucleus     Decreased mobility 06/15/2021   DOE (dyspnea on exertion) 06/12/2020   Essential hypertension 12/18/2014   Fibroid uterus 12/18/2014   Heart murmur, systolic 06/29/2021   History of alcohol abuse 02/16/2017   History of cocaine use 02/16/2017   History of latent syphilis 02/16/2017   Hyperlipidemia associated with type 2 diabetes mellitus 11/07/2020   Incontinence of urine    Lacunar infarction    MRI - chronic lacunar infarct within the posterior right lentiform nucleus.     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/2017   PTSD (post-traumatic stress disorder)    Pulmonary emphysema 08/31/2021   QT prolongation 06/29/2021   Right sided weakness    Social anxiety disorder 11/03/2017   Syncope 02/07/2015   TIA (transient ischemic attack)    Tobacco use 04/14/2015   Type 2 diabetes mellitus 10/21/2017   Venous stasis dermatitis of both lower extremities 06/15/2021   Past Surgical History:  Procedure Laterality Date   CESAREAN SECTION     x 2   Social History:  reports that she has been smoking cigarettes. She has never used smokeless tobacco. She reports that she does not currently use alcohol after a past usage of about 2.0 standard drinks of alcohol per week. She reports  that she does not currently use drugs after having used the following drugs: "Crack" cocaine.  Allergies  Allergen Reactions   Gadolinium Derivatives Nausea And Vomiting    Nausea and vomiting was followed by epileptic seizure episode that lasted approximately 5 minutes; pt was unable to verbally communicate during that  time; she then came to and was able to speak; was evaluated by Rad and RN; kms    Grapefruit Flavor [Flavoring Agent (Non-Screening)] Other (See Comments)    Drug interaction    Family History  Problem Relation Age of Onset   Stroke Mother    Anxiety disorder Mother    Drug abuse Mother    Alcohol abuse Mother    Depression Mother    Drug abuse Father    Anxiety disorder Father    Heart disease Maternal Grandmother    Colon cancer Neg Hx    Esophageal cancer Neg Hx    Pancreatic cancer Neg Hx    Stomach cancer Neg Hx    Liver disease Neg Hx    Breast cancer Neg Hx      Prior to Admission medications   Medication Sig Start Date End Date Taking? Authorizing Provider  atorvastatin (LIPITOR) 20 MG tablet TAKE 1 TABLET BY MOUTH EVERY DAY 07/13/22   Chandrasekhar, Mahesh A, MD  benzonatate (TESSALON) 100 MG capsule Take 1 capsule (100 mg total) by mouth every 8 (eight) hours. 10/24/22   Long, Arlyss Repress, MD  Budeson-Glycopyrrol-Formoterol (BREZTRI AEROSPHERE) 160-9-4.8 MCG/ACT AERO Inhale 2 puffs into the lungs in the morning and at bedtime. 12/08/22   Cobb, Ruby Cola, NP  Budeson-Glycopyrrol-Formoterol (BREZTRI AEROSPHERE) 160-9-4.8 MCG/ACT AERO Inhale 2 puffs into the lungs in the morning and at bedtime. 12/08/22   Cobb, Ruby Cola, NP  carbamazepine (TEGRETOL) 200 MG tablet Take 1 tablet (200 mg total) by mouth 2 (two) times daily. 01/03/23   Drema Dallas, DO  dicyclomine (BENTYL) 20 MG tablet Take 1 tablet (20 mg total) by mouth 3 (three) times daily as needed. 10/24/22   Long, Arlyss Repress, MD  empagliflozin (JARDIANCE) 10 MG TABS tablet Take 1 tablet (10 mg total) by mouth daily. 08/01/21   Burnadette Pop, MD  furosemide (LASIX) 80 MG tablet TAKE 1 TABLET BY MOUTH EVERY DAY 12/30/22   Chandrasekhar, Mahesh A, MD  gabapentin (NEURONTIN) 300 MG capsule TAKE 3 CAPSULES BY MOUTH 3 TIMES DAILY 01/12/23   Everlena Cooper, Adam R, DO  lisinopril (ZESTRIL) 20 MG tablet Take 20 mg by mouth 2 (two) times  daily.    [provider]  losartan (COZAAR) 25 MG tablet Take 1 tablet (25 mg total) by mouth daily. 08/02/21   Burnadette Pop, MD  meloxicam (MOBIC) 15 MG tablet Take 15 mg by mouth daily. 07/23/21   [provider]  metFORMIN (GLUCOPHAGE) 500 MG tablet Take 500 mg by mouth 2 (two) times daily. 05/25/17   [provider]  nicotine (NICODERM CQ - DOSED IN MG/24 HOURS) 14 mg/24hr patch Place 1 patch (14 mg total) onto the skin daily. 08/02/21   Burnadette Pop, MD  ondansetron (ZOFRAN-ODT) 4 MG disintegrating tablet Take 1 tablet (4 mg total) by mouth every 8 (eight) hours as needed. 10/24/22   Long, Arlyss Repress, MD  Polyethylene Glycol 3350 (PEG 3350) 17 GM/SCOOP POWD Take 17 g by mouth daily as needed (constipation). 11/17/20   [provider]  potassium chloride SA (KLOR-CON M) 20 MEQ tablet Take 1 tablet (20 mEq total) by mouth daily. 08/01/21  Burnadette Pop, MD  Spacer/Aero-Holding Chambers (AEROCHAMBER MV) inhaler Use as instructed 09/21/21   Coralyn Helling, MD  VENTOLIN HFA 108 (90 Base) MCG/ACT inhaler inhale 2 PUFFS into THE lungs EVERY 6 HOURS AS NEEDED FOR WHEEZING OR SHORTNESS OF BREATH 02/15/22   Coralyn Helling, MD  vortioxetine HBr (TRINTELLIX) 10 MG TABS tablet Take 1 tablet (10 mg total) by mouth daily. 07/15/22   Oletta Darter, MD    Physical Exam: BP (!) 153/82 (BP Location: Right Arm)   Pulse 79   Temp 98.1 F (36.7 C) (Oral)   Resp 19   Ht 5\' 5"  (1.651 m)   Wt (!) 141.3 kg   LMP 05/29/2017 (Approximate)   SpO2 94%   BMI 51.84 kg/m  General: Pleasant, morbidly obese woman sitting in triage chair. No acute distress. HEENT: [place]/AT. Anicteric sclera CV: RRR. No murmurs, rubs, or gallops. No LE edema Pulmonary: On 2 L [place]. Lungs CTAB. Normal effort. No wheezing or rales. Diminished lung sounds throughout. Abdominal: Soft, nontender, nondistended. Normal bowel sounds. Extremities: Palpable radial and DP pulses. Normal ROM. Skin: Warm and dry. No  obvious rash or lesions. Neuro: A&Ox3. Moves all extremities. Normal sensation to light touch. No focal deficit. Psych: Normal mood and affect          Labs on Admission:  Basic Metabolic Panel: Recent Labs  Lab 02/17/23 1320  NA 141  K 4.7  CL 101  CO2 28  GLUCOSE 83  BUN 7  CREATININE 0.81  CALCIUM 9.4   Liver Function Tests: No results for input(s): "AST", "ALT", "ALKPHOS", "BILITOT", "PROT", "ALBUMIN" in the last 168 hours. No results for input(s): "LIPASE", "AMYLASE" in the last 168 hours. No results for input(s): "AMMONIA" in the last 168 hours. CBC: Recent Labs  Lab 02/17/23 1320  WBC 13.5*  NEUTROABS 9.1*  HGB 14.6  HCT 45.9  MCV 90.4  PLT 360   Cardiac Enzymes: No results for input(s): "CKTOTAL", "CKMB", "CKMBINDEX", "TROPONINI" in the last 168 hours. BNP (last 3 results) Recent Labs    10/24/22 0859  BNP 19.8    ProBNP (last 3 results) No results for input(s): "PROBNP" in the last 8760 hours.  CBG: No results for input(s): "GLUCAP" in the last 168 hours.  Radiological Exams on Admission: DG Chest 2 View Result Date: 02/17/2023 CLINICAL DATA:  Cough and shortness of breath EXAM: CHEST - 2 VIEW COMPARISON:  Chest radiograph dated 12/08/2022 FINDINGS: Normal lung volumes. Subtle patchy bibasilar opacities. No pleural effusion or pneumothorax. Mildly enlarged cardiomediastinal silhouette. No acute osseous abnormality. IMPRESSION: 1. Subtle patchy bibasilar opacities, which may represent atypical infection/bronchopneumonia. 2. Mildly enlarged cardiomediastinal silhouette. Electronically Signed   By: Agustin Cree M.D.   On: 02/17/2023 14:38   Assessment/Plan Denise [name] is a 53 y.o. female with medical history significant for morbid obesity, diastolic heart failure, CVA, COPD, chronic hypoxic respiratory failure on 2 L [place], T2DM, OSA on CPAP, HTN, HLD, anxiety and depression who presented via EMS for evaluation of URI symptoms and admitted for  community-acquired pneumonia and COPD exacerbation.  # Community-acquired pneumonia # Bronchopneumonia Presents with worsening cough that is productive of dark yellow sputum found to have mild leukocytosis and patchy bibasilar opacities concerning for atypical infection/bronchopneumonia on CXR.  RSV, COVID and influenza A test negative. She remains afebrile and hemodynamically stable on home 2 L [place]. -Continue IV Rocephin and oral doxycycline -Follow-up sputum culture, urinary Legionella and strep pneumo -Check procalcitonin and full RVP -Trend CBC, fever curve  #  COPD exacerbation # Chronic hypoxic respiratory failure Reports she is on baseline 2 L [place].  Has had worsening cough and intermittent wheezing for over a month.  Shortness of breath is at her baseline.  Reported improvement in her symptoms after receiving IV Solu-Medrol and DuoNebs in the ED. -Continue on supplemental O2 -Start prednisone 40 mg for 4 additional days -Continue home bronchodilators -As needed DuoNebs  # HTN BP slightly elevated with SBP in the 140s to 150s. Patient reports she has not been taking her losartan. -Resume losartan  # Chronic diastolic HF Remains at baseline 2 L [place]. BNP 37.3. Patient not volume overloaded on exam.  No evidence of CHF exacerbation. -Continue supplemental O2 -Continue Lasix, losartan, Jardiance and potassium supplementation  # T2DM Blood glucose of 83 on BMP. -Continue metformin, Jardiance and gabapentin -Follow-up repeat A1c  # Hx CVA # HLD Continue atorvastatin  # Anxiety and depression # PTSD -Continue carbamazepine  # Class III obesity Body mass index is 51.84 kg/m. -Follow-up with PCP for weight loss and nutrition counseling  # Tobacco use disorder Reports smoking more than 5 cigarettes/day.  Unclear when she started smoking but reports she has been smoking since she was a teenager.  States she is interested in quitting and we discussed this with her PCP.  Interested  in nicotine patch during hospitalization. -Nicotine patch 14 mg daily  Smoking cessation counseling for 4 minutes today, ordered nicotine patch  I have discussed tobacco cessation with the patient.  I have counseled the patient regarding the negative impacts of continued tobacco use including but not limited to lung cancer, COPD, and cardiovascular disease.  I have discussed alternatives to tobacco and modalities that may help facilitate tobacco cessation including but not limited to biofeedback, hypnosis, and medications.  Total time spent with tobacco counseling was 4 minutes.   DVT prophylaxis: Lovenox     Code Status: Full Code  Consults called: None  Family Communication: No family at bedside  Severity of Illness: The appropriate patient status for this patient is OBSERVATION. Observation status is judged to be reasonable and necessary in order to provide the required intensity of service to ensure the patient's safety. The patient's presenting symptoms, physical exam findings, and initial radiographic and laboratory data in the context of their medical condition is felt to place them at decreased risk for further clinical deterioration. Furthermore, it is anticipated that the patient will be medically stable for discharge from the hospital within 2 midnights of admission.   Level of care: MedSurg  Steffanie Rainwater, MD 02/17/2023, 3:31 PM [facility] Pager: [phone] Isaiah 41:10   If 7PM-7AM, please contact night-coverage [email or web address] Password [email or web address]

## 2023-02-17 NOTE — Progress Notes (Signed)
Spoke to [employer] in pharmacy and she confirmed she will get a message to pharmacy tech to please come verify patient's home meds

## 2023-02-17 NOTE — ED Triage Notes (Signed)
Pt called EMS and reports not feeling well for one month. Flu like symptoms.

## 2023-02-17 NOTE — Progress Notes (Signed)
   02/17/23 2304  BiPAP/CPAP/SIPAP  BiPAP/CPAP/SIPAP Pt Type Adult  BiPAP/CPAP/SIPAP Resmed  Mask Type Full face mask  Mask Size Medium  Flow Rate 3 lpm  Patient Home Equipment No  Auto Titrate Yes (5-12)

## 2023-02-17 NOTE — ED Provider Notes (Signed)
[place] EMERGENCY DEPARTMENT AT [facility] Provider Note   CSN: [id number] Arrival date & time: 02/17/23  1039     History  Chief Complaint  Patient presents with   Fatigue    Denise [name] is a 53 y.o. female.  Denise [name] is a 53 y.o. female history of COPD on chronic 2 years nasal cannula TIA, CHF, hypertension, hyperlipidemia, diabetes, presents to the emergency department for evaluation of patient reports that she has felt with ongoing cough for over a month.  She saw this cough in the first week of January and was which she reports she still does not feel like she is improved and has started to have worsening of the cough.  She denies any hemoptysis.  She also reports some increasing shortness of breath in particular with activity.  She denies associated chest pain but does report some general soreness from frequent coughing and some sore throat associated with this she denies any known fevers.  Denies known sick contacts.  No other aggravating or alleviating factors.  The history is provided by the patient and medical records.       Home Medications Prior to Admission medications   Medication Sig Start Date End Date Taking? Authorizing Provider  atorvastatin (LIPITOR) 20 MG tablet TAKE 1 TABLET BY MOUTH EVERY DAY 07/13/22   Chandrasekhar, Mahesh A, MD  benzonatate (TESSALON) 100 MG capsule Take 1 capsule (100 mg total) by mouth every 8 (eight) hours. 10/24/22   Long, Arlyss Repress, MD  Budeson-Glycopyrrol-Formoterol (BREZTRI AEROSPHERE) 160-9-4.8 MCG/ACT AERO Inhale 2 puffs into the lungs in the morning and at bedtime. 12/08/22   Cobb, Ruby Cola, NP  Budeson-Glycopyrrol-Formoterol (BREZTRI AEROSPHERE) 160-9-4.8 MCG/ACT AERO Inhale 2 puffs into the lungs in the morning and at bedtime. 12/08/22   Cobb, Ruby Cola, NP  carbamazepine (TEGRETOL) 200 MG tablet Take 1 tablet (200 mg total) by mouth 2 (two) times daily. 01/03/23   Drema Dallas, DO   dicyclomine (BENTYL) 20 MG tablet Take 1 tablet (20 mg total) by mouth 3 (three) times daily as needed. 10/24/22   Long, Arlyss Repress, MD  empagliflozin (JARDIANCE) 10 MG TABS tablet Take 1 tablet (10 mg total) by mouth daily. 08/01/21   Burnadette Pop, MD  furosemide (LASIX) 80 MG tablet TAKE 1 TABLET BY MOUTH EVERY DAY 12/30/22   Chandrasekhar, Mahesh A, MD  gabapentin (NEURONTIN) 300 MG capsule TAKE 3 CAPSULES BY MOUTH 3 TIMES DAILY 01/12/23   Everlena Cooper, Adam R, DO  lisinopril (ZESTRIL) 20 MG tablet Take 20 mg by mouth 2 (two) times daily.    [provider]  losartan (COZAAR) 25 MG tablet Take 1 tablet (25 mg total) by mouth daily. 08/02/21   Burnadette Pop, MD  meloxicam (MOBIC) 15 MG tablet Take 15 mg by mouth daily. 07/23/21   [provider]  metFORMIN (GLUCOPHAGE) 500 MG tablet Take 500 mg by mouth 2 (two) times daily. 05/25/17   [provider]  nicotine (NICODERM CQ - DOSED IN MG/24 HOURS) 14 mg/24hr patch Place 1 patch (14 mg total) onto the skin daily. 08/02/21   Burnadette Pop, MD  ondansetron (ZOFRAN-ODT) 4 MG disintegrating tablet Take 1 tablet (4 mg total) by mouth every 8 (eight) hours as needed. 10/24/22   Long, Arlyss Repress, MD  Polyethylene Glycol 3350 (PEG 3350) 17 GM/SCOOP POWD Take 17 g by mouth daily as needed (constipation). 11/17/20   [provider]  potassium chloride SA (KLOR-CON M) 20 MEQ tablet Take 1 tablet (  20 mEq total) by mouth daily. 08/01/21   Burnadette Pop, MD  Spacer/Aero-Holding Chambers (AEROCHAMBER MV) inhaler Use as instructed 09/21/21   Coralyn Helling, MD  VENTOLIN HFA 108 (90 Base) MCG/ACT inhaler inhale 2 PUFFS into THE lungs EVERY 6 HOURS AS NEEDED FOR WHEEZING OR SHORTNESS OF BREATH 02/15/22   Coralyn Helling, MD  vortioxetine HBr (TRINTELLIX) 10 MG TABS tablet Take 1 tablet (10 mg total) by mouth daily. 07/15/22   Oletta Darter, MD      Allergies    Gadolinium derivatives and Grapefruit flavor [flavoring agent (non-screening)]    Review  of Systems   Review of Systems  Constitutional:  Positive for fatigue. Negative for chills and fever.  HENT:  Positive for congestion and sore throat.   Respiratory:  Positive for cough and shortness of breath.   Cardiovascular:  Negative for chest pain, palpitations and leg swelling.  Gastrointestinal:  Negative for abdominal pain, nausea and vomiting.    Physical Exam Updated Vital Signs BP (!) 145/83 (BP Location: Right Arm)   Pulse 85   Temp 97.9 F (36.6 C) (Oral)   Resp 15   Ht 5\' 5"  (1.651 m)   Wt (!) 141.3 kg   LMP 05/29/2017 (Approximate)   SpO2 93%   BMI 51.84 kg/m  Physical Exam Vitals and nursing note reviewed.  Constitutional:      General: She is not in acute distress.    Appearance: Normal appearance. She is well-developed. She is obese. She is ill-appearing. She is not diaphoretic.     Comments: Alert, chronically ill-appearing but in no acute distress  HENT:     Head: Normocephalic and atraumatic.     Nose: Rhinorrhea present.     Mouth/Throat:     Mouth: Mucous membranes are moist.     Pharynx: Oropharynx is clear.  Eyes:     General:        Right eye: No discharge.        Left eye: No discharge.  Cardiovascular:     Rate and Rhythm: Normal rate and regular rhythm.     Pulses: Normal pulses.     Heart sounds: Normal heart sounds.  Pulmonary:     Effort: Pulmonary effort is normal. No respiratory distress.     Breath sounds: No wheezing or rales.     Comments: On 3Liters nasal cannula respirations are equal and unlabored and patient is quotation patient has reduced air movement throughout without wheezing or. Abdominal:     General: Bowel sounds are normal. There is no distension.     Palpations: Abdomen is soft. There is no mass.     Tenderness: There is no abdominal tenderness. There is no guarding.     Comments: Abdomen soft, nondistended, nontender to palpation in all quadrants without guarding or peritoneal signs  Musculoskeletal:         General: No deformity.     Cervical back: Neck supple.  Skin:    General: Skin is warm and dry.     Capillary Refill: Capillary refill takes less than 2 seconds.  Neurological:     Mental Status: She is alert and oriented to person, place, and time.     Coordination: Coordination normal.     Comments: Speech is clear, able to follow commands Moves extremities without ataxia, coordination intact  Psychiatric:        Mood and Affect: Mood normal.        Behavior: Behavior normal.     ED  Results / Procedures / Treatments   Labs (all labs ordered are listed, but only abnormal results are displayed) Labs Reviewed  CBC WITH DIFFERENTIAL/PLATELET - Abnormal; Notable for the following components:      Result Value   WBC 13.5 (*)    Neutro Abs 9.1 (*)    All other components within normal limits  RESP PANEL BY RT-PCR (RSV, FLU A&B, COVID)  RVPGX2  EXPECTORATED SPUTUM ASSESSMENT W GRAM STAIN, RFLX TO RESP C  RESPIRATORY PANEL BY PCR  MRSA NEXT GEN BY PCR, NASAL  BASIC METABOLIC PANEL  STREP PNEUMONIAE URINARY ANTIGEN  LEGIONELLA PNEUMOPHILA SEROGP 1 UR AG  BRAIN NATRIURETIC PEPTIDE    EKG None  Radiology DG Chest 2 View Result Date: 02/17/2023 CLINICAL DATA:  Cough and shortness of breath EXAM: CHEST - 2 VIEW COMPARISON:  Chest radiograph dated 12/08/2022 FINDINGS: Normal lung volumes. Subtle patchy bibasilar opacities. No pleural effusion or pneumothorax. Mildly enlarged cardiomediastinal silhouette. No acute osseous abnormality. IMPRESSION: 1. Subtle patchy bibasilar opacities, which may represent atypical infection/bronchopneumonia. 2. Mildly enlarged cardiomediastinal silhouette. Electronically Signed   By: Agustin Cree M.D.   On: 02/17/2023 14:38    Procedures Procedures    Medications Ordered in ED Medications  cefTRIAXone (ROCEPHIN) 1 g in sodium chloride 0.9 % 100 mL IVPB (1 g Intravenous New Bag/Given 02/17/23 1531)  ipratropium-albuterol (DUONEB) 0.5-2.5 (3) MG/3ML  nebulizer solution 3 mL (3 mLs Nebulization Given 02/17/23 1307)  methylPREDNISolone sodium succinate (SOLU-MEDROL) 125 mg/2 mL injection 125 mg (125 mg Intravenous Given 02/17/23 1341)  albuterol (VENTOLIN HFA) 108 (90 Base) MCG/ACT inhaler 2 puff (2 puffs Inhalation Given 02/17/23 1459)  doxycycline (VIBRA-TABS) tablet 100 mg (100 mg Oral Given 02/17/23 1532)    ED Course/ Medical Decision Making/ A&P                                 Medical Decision Making Amount and/or Complexity of Data Reviewed Labs: ordered. Radiology: ordered.  Risk Prescription drug management. Decision regarding hospitalization.   53 year old female presents with persistent cough course of azithromycin and and worsening shortness of breath.  Initially was able to titrate patient back down to her typical 2 L oxygen requirement then patient began to drop sats into the 80s and had increased to 3.5 L.  Patient with frequent cough that is intermittently productive, some decreased air movement on exam concerning for some component of COPD exacerbation.  Patient giving breathing treatment and steroids.  Basic lab work obtained and overall minimal leukocytosis noted, no lecture light derangements.  Flu, COVID and testing negative.  Chest x-ray with some patchy infiltrates concerning for possible given the patient has already completed some antibiotics and has had worsening and now has an increasing oxygen requirement feel she would benefit from admission.  Patient given IV Rocephin and doxycycline.  Hospitalist consulted for admission, case discussed with Dr.Amponsah who will see and admit the patient.        Final Clinical Impression(s) / ED Diagnoses Final diagnoses:  Bronchopneumonia  Acute on chronic hypoxic respiratory failure [name])    Rx / DC Orders ED Discharge Orders     None         Velda Shell 02/17/23 1602    Cathren Laine, MD 02/18/23 [phone]

## 2023-02-18 ENCOUNTER — Encounter (HOSPITAL_COMMUNITY): Payer: Self-pay | Admitting: Internal Medicine

## 2023-02-18 DIAGNOSIS — I10 Essential (primary) hypertension: Secondary | ICD-10-CM

## 2023-02-18 DIAGNOSIS — J441 Chronic obstructive pulmonary disease with (acute) exacerbation: Secondary | ICD-10-CM

## 2023-02-18 DIAGNOSIS — F4310 Post-traumatic stress disorder, unspecified: Secondary | ICD-10-CM | POA: Diagnosis present

## 2023-02-18 DIAGNOSIS — E785 Hyperlipidemia, unspecified: Secondary | ICD-10-CM | POA: Diagnosis present

## 2023-02-18 DIAGNOSIS — Z7984 Long term (current) use of oral hypoglycemic drugs: Secondary | ICD-10-CM | POA: Diagnosis not present

## 2023-02-18 DIAGNOSIS — E66813 Obesity, class 3: Secondary | ICD-10-CM | POA: Diagnosis present

## 2023-02-18 DIAGNOSIS — Z716 Tobacco abuse counseling: Secondary | ICD-10-CM | POA: Diagnosis not present

## 2023-02-18 DIAGNOSIS — F17210 Nicotine dependence, cigarettes, uncomplicated: Secondary | ICD-10-CM | POA: Diagnosis present

## 2023-02-18 DIAGNOSIS — G500 Trigeminal neuralgia: Secondary | ICD-10-CM | POA: Diagnosis present

## 2023-02-18 DIAGNOSIS — Z8673 Personal history of transient ischemic attack (TIA), and cerebral infarction without residual deficits: Secondary | ICD-10-CM

## 2023-02-18 DIAGNOSIS — F419 Anxiety disorder, unspecified: Secondary | ICD-10-CM | POA: Diagnosis present

## 2023-02-18 DIAGNOSIS — Z6841 Body Mass Index (BMI) 40.0 and over, adult: Secondary | ICD-10-CM | POA: Diagnosis not present

## 2023-02-18 DIAGNOSIS — J439 Emphysema, unspecified: Secondary | ICD-10-CM | POA: Diagnosis present

## 2023-02-18 DIAGNOSIS — E662 Morbid (severe) obesity with alveolar hypoventilation: Secondary | ICD-10-CM | POA: Diagnosis present

## 2023-02-18 DIAGNOSIS — J189 Pneumonia, unspecified organism: Secondary | ICD-10-CM | POA: Diagnosis not present

## 2023-02-18 DIAGNOSIS — H40053 Ocular hypertension, bilateral: Secondary | ICD-10-CM | POA: Diagnosis present

## 2023-02-18 DIAGNOSIS — F09 Unspecified mental disorder due to known physiological condition: Secondary | ICD-10-CM | POA: Diagnosis present

## 2023-02-18 DIAGNOSIS — Z1152 Encounter for screening for COVID-19: Secondary | ICD-10-CM | POA: Diagnosis not present

## 2023-02-18 DIAGNOSIS — F32A Depression, unspecified: Secondary | ICD-10-CM | POA: Diagnosis present

## 2023-02-18 DIAGNOSIS — J440 Chronic obstructive pulmonary disease with acute lower respiratory infection: Secondary | ICD-10-CM | POA: Diagnosis present

## 2023-02-18 DIAGNOSIS — J180 Bronchopneumonia, unspecified organism: Secondary | ICD-10-CM | POA: Diagnosis present

## 2023-02-18 DIAGNOSIS — J9621 Acute and chronic respiratory failure with hypoxia: Secondary | ICD-10-CM | POA: Diagnosis present

## 2023-02-18 DIAGNOSIS — B948 Sequelae of other specified infectious and parasitic diseases: Secondary | ICD-10-CM | POA: Diagnosis not present

## 2023-02-18 DIAGNOSIS — R5383 Other fatigue: Secondary | ICD-10-CM | POA: Diagnosis present

## 2023-02-18 DIAGNOSIS — Z8249 Family history of ischemic heart disease and other diseases of the circulatory system: Secondary | ICD-10-CM | POA: Diagnosis not present

## 2023-02-18 DIAGNOSIS — J432 Centrilobular emphysema: Secondary | ICD-10-CM

## 2023-02-18 DIAGNOSIS — E1169 Type 2 diabetes mellitus with other specified complication: Secondary | ICD-10-CM | POA: Diagnosis present

## 2023-02-18 DIAGNOSIS — I110 Hypertensive heart disease with heart failure: Secondary | ICD-10-CM | POA: Diagnosis present

## 2023-02-18 DIAGNOSIS — I5032 Chronic diastolic (congestive) heart failure: Secondary | ICD-10-CM | POA: Diagnosis present

## 2023-02-18 LAB — BASIC METABOLIC PANEL
Anion gap: 10 (ref 5–15)
BUN: 9 mg/dL (ref 6–20)
CO2: 26 mmol/L (ref 22–32)
Calcium: 9.2 mg/dL (ref 8.9–10.3)
Chloride: 101 mmol/L (ref 98–111)
Creatinine, Ser: 0.65 mg/dL (ref 0.44–1.00)
GFR, Estimated: >60 mL/min (ref >60)
Glucose, Bld: 128 mg/dL — ABNORMAL HIGH (ref 70–99)
Potassium: 4.4 mmol/L (ref 3.5–5.1)
Sodium: 137 mmol/L (ref 135–145)

## 2023-02-18 LAB — STREP PNEUMONIAE URINARY ANTIGEN: Strep Pneumo Urinary Antigen: NEGATIVE

## 2023-02-18 LAB — CBC
HCT: 42.9 % (ref 36.0–46.0)
Hemoglobin: 13.4 g/dL (ref 12.0–15.0)
MCH: 28 pg (ref 26.0–34.0)
MCHC: 31.2 g/dL (ref 30.0–36.0)
MCV: 89.7 fL (ref 80.0–100.0)
Platelets: 350 10*3/uL (ref 150–400)
RBC: 4.78 MIL/uL (ref 3.87–5.11)
RDW: 15.3 % (ref 11.5–15.5)
WBC: 11.4 10*3/uL — ABNORMAL HIGH (ref 4.0–10.5)
nRBC: 0 % (ref 0.0–0.2)

## 2023-02-18 LAB — GLUCOSE, CAPILLARY
Glucose-Capillary: 133 mg/dL — ABNORMAL HIGH (ref 70–99)
Glucose-Capillary: 163 mg/dL — ABNORMAL HIGH (ref 70–99)
Glucose-Capillary: 112 mg/dL — ABNORMAL HIGH (ref 70–99)

## 2023-02-18 LAB — HEMOGLOBIN A1C
Hgb A1c MFr Bld: 6.3 % — ABNORMAL HIGH (ref 4.8–5.6)
Mean Plasma Glucose: 134.11 mg/dL

## 2023-02-18 MED ORDER — INSULIN ASPART 100 UNIT/ML IJ SOLN
0.0000 [IU] | Freq: Three times a day (TID) | INTRAMUSCULAR | Status: DC
  Administered 2023-02-18: 2 [IU] via SUBCUTANEOUS
  Administered 2023-02-18 – 2023-02-20 (×3): 3 [IU] via SUBCUTANEOUS

## 2023-02-18 NOTE — Progress Notes (Signed)
   02/18/23 2144  BiPAP/CPAP/SIPAP  BiPAP/CPAP/SIPAP Pt Type Adult (Pt was already on the cpap prefer self placement)  BiPAP/CPAP/SIPAP Resmed  Mask Type Full face mask  Mask Size Medium  Flow Rate 3 lpm  Patient Home Equipment No  Auto Titrate Yes (5/12)  CPAP/SIPAP surface wiped down Yes  BiPAP/CPAP /SiPAP Vitals  Pulse Rate 98  Resp 17  SpO2 98 %  Bilateral Breath Sounds Diminished  MEWS Score/Color  MEWS Score 0  MEWS Score Color Denise [name]

## 2023-02-18 NOTE — TOC Initial Note (Signed)
Transition of Care [facility]) - Initial/Assessment Note    Patient Details  Name: Denise [name] MRN: [id number] Date of Birth: November 24, 1970  Transition of Care [facility]) CM/SW Contact:    Lanier Clam, RN Phone Number: 02/18/2023, 6:19 PM  Clinical Narrative: No preference for HHC-rep [facility] accepted for HHPT. Patient has home 02,& travel tank. Has own transport home.                 Expected Discharge Plan: Home w Home Health Services Barriers to Discharge: Continued Medical Work up   Patient Goals and CMS Choice Patient states their goals for this hospitalization and ongoing recovery are:: Home CMS Medicare.gov Compare Post Acute Care list provided to:: Patient Represenative (must comment) Choice offered to / list presented to : Patient [place] ownership interest in [facility].provided to:: Patient    Expected Discharge Plan and Services   Discharge Planning Services: CM Consult Post Acute Care Choice: Home Health Living arrangements for the past 2 months: Single Family Home                           HH Arranged: PT HH Agency: [facility] Date HH Agency Contacted: 02/18/23 Time HH Agency Contacted: [phone] Representative spoke with at [facility] Agency: Adele Dan  Prior Living Arrangements/Services Living arrangements for the past 2 months: Single Family Home Lives with:: Adult Children Patient language and need for interpreter reviewed:: Yes Do you feel safe going back to the place where you live?: Yes      Need for Family Participation in Patient Care: Yes (Comment) Care giver support system in place?: Yes (comment)   Criminal Activity/Legal Involvement Pertinent to Current Situation/Hospitalization: No - Comment as needed  Activities of Daily Living   ADL Screening (condition at time of admission) Independently performs ADLs?: Yes (appropriate for developmental age) Is the patient deaf or have difficulty hearing?: No Does  the patient have difficulty seeing, even when wearing glasses/contacts?: No Does the patient have difficulty concentrating, remembering, or making decisions?: Yes  Permission Sought/Granted Permission sought to share information with : Case Manager Permission granted to share information with : Yes, Verbal Permission Granted  Share Information with NAME: Case manager           Emotional Assessment Appearance:: Appears stated age Attitude/Demeanor/Rapport: Gracious Affect (typically observed): Accepting Orientation: : Oriented to Self, Oriented to Place, Oriented to  Time, Oriented to Situation Alcohol / Substance Use: Not Applicable Psych Involvement: No (comment)  Admission diagnosis:  Bronchopneumonia [J18.0] Community acquired pneumonia [J18.9] Acute on chronic hypoxic respiratory failure (HCC) [J96.21] CAP (community acquired pneumonia) [J18.9] Patient Active Problem List   Diagnosis Date Noted   Bronchopneumonia 02/18/2023   COPD with acute exacerbation (HCC) 02/18/2023   CAP (community acquired pneumonia) 02/18/2023   Community acquired pneumonia 02/17/2023   Incontinence of urine    Lumbar degenerative disc disease    PTSD (post-traumatic stress disorder)    Lacunar infarction    Borderline glaucoma with ocular hypertension, 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   Nuclear sclerosis of both eyes 10/20/2022   Pulmonary emphysema 08/31/2021   Difficulty demonstrating health literacy 08/17/2021   Chronic diastolic CHF (congestive heart failure) 07/29/2021   Back pain 07/29/2021   Heart murmur, systolic 06/29/2021   QT prolongation 06/29/2021   Decreased mobility 06/15/2021   Venous stasis dermatitis of both lower extremities 06/15/2021   Hyperlipidemia associated with type 2 diabetes mellitus 11/07/2020   DOE (dyspnea on exertion)  06/12/2020   OSA on CPAP 06/12/2020   Class 3 obesity with alveolar hypoventilation, serious comorbidity, and body mass index (BMI) of 50.0 to 59.9 in adult  06/12/2020   Low back pain 11/21/2017   Social anxiety disorder 11/03/2017   Type 2 diabetes mellitus 10/21/2017   Psychogenic nonepileptic seizure 02/17/2017   History of latent syphilis 02/16/2017   Major depressive disorder 02/16/2017   History of alcohol abuse 02/16/2017   History of cocaine use 02/16/2017   Atopic dermatitis 05/28/2016   Tobacco use 04/14/2015   Right sided weakness    CVA (cerebrovascular accident) 2017   Pelvic pain in female 12/18/2014   Fibroid uterus 12/18/2014   Essential hypertension 12/18/2014   PCP:  [facility] Pharmacy:   [facility] [place], [place] - [address] [place] [address] Phone: [phone] Fax: [phone]     Social Drivers of Health (SDOH) Social History: SDOH Screenings   Food Insecurity: No Food Insecurity (02/17/2023)  Housing: Low Risk  (02/17/2023)  Transportation Needs: No Transportation Needs (02/17/2023)  Utilities: Not At Risk (02/17/2023)  Depression (PHQ2-9): High Risk (07/15/2022)  Financial Resource Strain: Not on File (05/14/2021)   Received from [place], [place]  Physical Activity: Not on File (05/14/2021)   Received from [place], [place]  Social Connections: Unknown (02/17/2023)  Stress: Not on File (05/14/2021)   Received from [place], [place]  Tobacco Use: High Risk (02/18/2023)   SDOH Interventions:     Readmission Risk Interventions     No data to display

## 2023-02-18 NOTE — TOC Initial Note (Signed)
Transition of Care [facility]) - Initial/Assessment Note    Patient Details  Name: Denise [name] MRN: [id number] Date of Birth: 10/30/1970  Transition of Care [facility]) CM/SW Contact:    Lanier Clam, RN Phone Number: 02/18/2023, 3:25 PM  Clinical Narrative: d/c plan home.                  Expected Discharge Plan: Home/Self Care Barriers to Discharge: Continued Medical Work up   Patient Goals and CMS Choice            Expected Discharge Plan and Services                                              Prior Living Arrangements/Services                       Activities of Daily Living   ADL Screening (condition at time of admission) Independently performs ADLs?: Yes (appropriate for developmental age) Is the patient deaf or have difficulty hearing?: No Does the patient have difficulty seeing, even when wearing glasses/contacts?: No Does the patient have difficulty concentrating, remembering, or making decisions?: Yes  Permission Sought/Granted                  Emotional Assessment              Admission diagnosis:  Bronchopneumonia [J18.0] Community acquired pneumonia [J18.9] Acute on chronic hypoxic respiratory failure (HCC) [J96.21] CAP (community acquired pneumonia) [J18.9] Patient Active Problem List   Diagnosis Date Noted   Bronchopneumonia 02/18/2023   COPD with acute exacerbation (HCC) 02/18/2023   CAP (community acquired pneumonia) 02/18/2023   Community acquired pneumonia 02/17/2023   Incontinence of urine    Lumbar degenerative disc disease    PTSD (post-traumatic stress disorder)    Lacunar infarction    Borderline glaucoma with ocular hypertension, 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   Nuclear sclerosis of both eyes 10/20/2022   Pulmonary emphysema 08/31/2021   Difficulty demonstrating health literacy 08/17/2021   Chronic diastolic CHF (congestive heart failure) 07/29/2021   Back pain 07/29/2021   Heart murmur, systolic  06/29/2021   QT prolongation 06/29/2021   Decreased mobility 06/15/2021   Venous stasis dermatitis of both lower extremities 06/15/2021   Hyperlipidemia associated with type 2 diabetes mellitus 11/07/2020   DOE (dyspnea on exertion) 06/12/2020   OSA on CPAP 06/12/2020   Class 3 obesity with alveolar hypoventilation, serious comorbidity, and body mass index (BMI) of 50.0 to 59.9 in adult 06/12/2020   Low back pain 11/21/2017   Social anxiety disorder 11/03/2017   Type 2 diabetes mellitus 10/21/2017   Psychogenic nonepileptic seizure 02/17/2017   History of latent syphilis 02/16/2017   Major depressive disorder 02/16/2017   History of alcohol abuse 02/16/2017   History of cocaine use 02/16/2017   Atopic dermatitis 05/28/2016   Tobacco use 04/14/2015   Right sided weakness    CVA (cerebrovascular accident) 2017   Pelvic pain in female 12/18/2014   Fibroid uterus 12/18/2014   Essential hypertension 12/18/2014   PCP:  [facility] Pharmacy:   [facility] [place], [place] - [address] [place] [address] Phone: [phone] Fax: [phone]     Social Drivers of Health (SDOH) Social History: SDOH Screenings   Food Insecurity: No Food Insecurity (02/17/2023)  Housing: Low  Risk  (02/17/2023)  Transportation Needs: No Transportation Needs (02/17/2023)  Utilities: Not At Risk (02/17/2023)  Depression (PHQ2-9): High Risk (07/15/2022)  Financial Resource Strain: Not on File (05/14/2021)   Received from [place], [place]  Physical Activity: Not on File (05/14/2021)   Received from [place], [place]  Social Connections: Unknown (02/17/2023)  Stress: Not on File (05/14/2021)   Received from [place], [place]  Tobacco Use: High Risk (02/17/2023)   SDOH Interventions:     Readmission Risk Interventions     No data to display

## 2023-02-18 NOTE — Progress Notes (Signed)
[facility]                                                                               Denise [name], is a 53 y.o. female, DOB - [birth year], BJY:[id number] Admit date - 02/17/2023    Outpatient Primary MD for the patient is [facility]  LOS - 0  days    Brief summary   Denise [name] is a 53 y.o. female with medical history significant for morbid obesity, diastolic heart failure, CVA, COPD, chronic hypoxic respiratory failure on 2 L [place], T2DM, OSA on CPAP, HTN, HLD, anxiety and depression who presented via EMS for evaluation of URI symptoms and admitted for community-acquired pneumonia and COPD exacerbation.    Assessment & Plan    Assessment and Plan:   Acute on chronic respiratory failure with hypoxia  on 2 to 3 lit/min in the setting of COPD/ OSA/OHS, Chronic bronchitis, ongoing tobacco abuse Secondary to community acquired pneumonia/ bronchopneumonia  Slowly improving.  Continue with IV antibiotics.  RSV, COVID and influenza PCR is negative.  Sputum cultures are pending.  Urinary legionella and strep pneumo antigen pending.  Continue with prednisone and home bronchodilators.    H/o Chronic Diastolic Heart failure:  Resume home meds.   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    H/o Hemorrhagic CVA left basal ganglia On lipitor continue the same.    Hypertension:  BP parameters are optimal.   Type 2 DM Non insulin dependent DM: Resume home meds.    Body mass index is 51.84 kg/m. Morbid Obesity Recommend outpatient follow up with PCP.     Anxiety and depression, PTSD:  Resume home meds.   H/o syphilis Cognitive dysfunction.    Estimated body mass index is 51.84 kg/m as calculated from the following:   Height as of this encounter: 5\' 5"  (1.651 m).   Weight as of this encounter: 141.3 kg.  Code Status: full code.  DVT  Prophylaxis:   Lovenox.   Level of Care: Level of care: Med-Surg Family Communication: none at bedside.   Disposition Plan:     Remains inpatient appropriate:  pending clinical improvement.   Procedures:  None.   Consultants:   None.   Antimicrobials:   Anti-infectives (From admission, onward)    Start     Dose/Rate Route Frequency Ordered Stop   02/18/23 1515  cefTRIAXone (ROCEPHIN) 1 g in sodium chloride 0.9 % 100 mL IVPB        1 g 200 mL/hr over 30 Minutes Intravenous Every 24 hours 02/17/23 2143 02/22/23 1514   02/18/23 1000  doxycycline (VIBRA-TABS) tablet 100 mg        100 mg Oral Every 12 hours 02/17/23 2143 02/22/23 0959   02/17/23 1515  cefTRIAXone (ROCEPHIN) 1 g in sodium chloride 0.9 % 100 mL IVPB        1 g 200 mL/hr over 30 Minutes Intravenous  Once 02/17/23 1508 02/17/23 1637   02/17/23 1515  doxycycline (VIBRA-TABS) tablet 100 mg        100 mg Oral  Once 02/17/23 1508 02/17/23 1532  Medications  Scheduled Meds:  atorvastatin  20 mg Oral Daily   carbamazepine  200 mg Oral BID   dextromethorphan-guaiFENesin  1 tablet Oral BID   doxycycline  100 mg Oral Q12H   empagliflozin  10 mg Oral Daily   enoxaparin (LOVENOX) injection  70 mg Subcutaneous QHS   furosemide  80 mg Oral q AM   gabapentin  900 mg Oral TID   losartan  25 mg Oral Daily   metFORMIN  500 mg Oral BID WC   mometasone-formoterol  2 puff Inhalation BID   nicotine  14 mg Transdermal Daily   potassium chloride SA  20 mEq Oral Daily   prednisoLONE  40 mg Oral Daily   umeclidinium bromide  1 puff Inhalation Daily   Continuous Infusions:  cefTRIAXone (ROCEPHIN)  IV     PRN Meds:.acetaminophen **OR** acetaminophen, ipratropium-albuterol, ondansetron **OR** ondansetron (ZOFRAN) IV, senna-docusate    Subjective:   Denise [name] was seen and examined today.    Objective:   Vitals:   02/17/23 1727 02/17/23 2135 02/18/23 0129 02/18/23 0521  BP: (!) 157/81 (!) 144/60 (!)  157/71 136/76  Pulse: 77 78 80 65  Resp: 18 18 19 17   Temp: 97.9 F (36.6 C) 98.1 F (36.7 C) 97.6 F (36.4 C) 97.6 F (36.4 C)  TempSrc: Oral Oral  Oral  SpO2: 96% 98% 97% 97%  Weight:      Height:       No intake or output data in the 24 hours ending 02/18/23 0944 Filed Weights   02/17/23 1049  Weight: (!) 141.3 kg     Exam General exam: Appears calm and comfortable  Respiratory system: air entry fair, on 2 lit of [place] oxygen.  Cardiovascular system: S1 & S2 heard, RRR. No JVD,  Gastrointestinal system: Abdomen is nondistended, soft and nontender.  Central nervous system: Alert and oriented. No focal neurological deficits. Extremities: Symmetric 5 x 5 power. Skin: No rashes, Psychiatry:  Mood & affect appropriate.     Data Reviewed:  I have personally reviewed following labs and imaging studies   CBC Lab Results  Component Value Date   WBC 11.4 (H) 02/18/2023   RBC 4.78 02/18/2023   HGB 13.4 02/18/2023   HCT 42.9 02/18/2023   MCV 89.7 02/18/2023   MCH 28.0 02/18/2023   PLT 350 02/18/2023   MCHC 31.2 02/18/2023   RDW 15.3 02/18/2023   LYMPHSABS 3.1 02/17/2023   MONOABS 1.0 02/17/2023   EOSABS 0.2 02/17/2023   BASOSABS 0.1 02/17/2023     Last metabolic panel Lab Results  Component Value Date   NA 137 02/18/2023   K 4.4 02/18/2023   CL 101 02/18/2023   CO2 26 02/18/2023   BUN 9 02/18/2023   CREATININE 0.65 02/18/2023   GLUCOSE 128 (H) 02/18/2023   GFRNONAA >60 02/18/2023   GFRAA >60 09/27/2019   CALCIUM 9.2 02/18/2023   PROT 7.7 10/24/2022   ALBUMIN 4.0 10/24/2022   LABGLOB 2.2 10/30/2018   AGRATIO 2.0 10/30/2018   BILITOT 0.6 10/24/2022   ALKPHOS 98 10/24/2022   AST 26 10/24/2022   ALT 36 10/24/2022   ANIONGAP 10 02/18/2023    CBG (last 3)  No results for input(s): "GLUCAP" in the last 72 hours.    Coagulation Profile: No results for input(s): "INR", "PROTIME" in the last 168 hours.   Radiology Studies: DG Chest 2 View Result Date:  02/17/2023 CLINICAL DATA:  Cough and shortness of breath EXAM: CHEST - 2 VIEW COMPARISON:  Chest radiograph dated 12/08/2022 FINDINGS: Normal lung volumes. Subtle patchy bibasilar opacities. No pleural effusion or pneumothorax. Mildly enlarged cardiomediastinal silhouette. No acute osseous abnormality. IMPRESSION: 1. Subtle patchy bibasilar opacities, which may represent atypical infection/bronchopneumonia. 2. Mildly enlarged cardiomediastinal silhouette. Electronically Signed   By: Agustin Cree M.D.   On: 02/17/2023 14:38       Kathlen Mody M.D. [facility] 02/18/2023, 9:44 AM  Available via Epic secure chat 7am-7pm After 7 pm, please refer to night coverage provider listed on amion.

## 2023-02-18 NOTE — Evaluation (Signed)
Physical Therapy Evaluation Patient Details Name: Denise [name] MRN: [id number] DOB: Feb 23, 1970 Today's Date: 02/18/2023  History of Present Illness  53 yo female presents to therapy following hospital admission on 02/17/2023 due to URI symptoms and found to have PNA with COPD exacerbation. Pt PMH includes but is not limited to: dCHF, CVA, COPD, chronic hypoxic respiratory failure on 2 L/min at baseline, DM II, OSA on CPAP, HTN, HLD, GAD, LBP and tobacco abuse.  Clinical Impression      Pt admitted with above diagnosis.  Pt currently with functional limitations due to the deficits listed below (see PT Problem List). Pt seated EOB when PT arrived. Pt agreeable to therapy intervention. Pt stated she has been able to get up and transfer from bed <> bathroom mod I. Pt encouraged to use RW for safety, stability and energy conservation with pt demonstrating verbal understanding. Pt reports mild SOB with O2 saturation decreasing to 87% with exertion during gait with RW and S. Pt required 32s to recover to 90% with seated therapeutic rest break and cues for breathing strategies. Pt left seated in recliner, on 3 L/min supplemental O2 and 92%, all needs in place. Pt will benefit from acute skilled PT to increase their independence and safety with mobility to allow discharge.       If plan is discharge home, recommend the following: A little help with walking and/or transfers;A little help with bathing/dressing/bathroom;Assistance with cooking/housework;Help with stairs or ramp for entrance;Assist for transportation   Can travel by private vehicle        Equipment Recommendations None recommended by PT  Recommendations for Other Services       Functional Status Assessment Patient has had a recent decline in their functional status and demonstrates the ability to make significant improvements in function in a reasonable and predictable amount of time.     Precautions / Restrictions  Precautions Precautions: Fall Restrictions Weight Bearing Restrictions Per Provider Order: No      Mobility  Bed Mobility Overal bed mobility: Modified Independent             General bed mobility comments: use of hospital bed with pt seated EOB upon PT arrival    Transfers Overall transfer level: Modified independent Equipment used: Rolling walker (2 wheels)               General transfer comment: pt performing transfer tasks bed <> bathroom mod I with use of RW, pt will benefit from [place] tube extension and nurse made aware    Ambulation/Gait Ambulation/Gait assistance: Supervision Gait Distance (Feet): 25 Feet Assistive device: Rolling walker (2 wheels) Gait Pattern/deviations: Decreased step length - right, Decreased step length - left, Wide base of support Gait velocity: decreaed     General Gait Details: min cues for posture and proper use of AD with pt electing to not take RW into the bathroom. pt reports she does not like the RW and would preferr rollator. pt ed provided on use of RW at time of eval for energy conservation and pt demonstated verbal understanding pt desaturated to 87% on 3 L/min and required 32s to reover to 90% with seated threaputic rest break and cues for pursed lip breathing. pt indicated mild SOB and declined further amb in hallway  Stairs            Wheelchair Mobility     Tilt Bed    Modified Rankin (Stroke Patients Only)       Balance Overall balance assessment: Mild  deficits observed, not formally tested, History of Falls                                           Pertinent Vitals/Pain Pain Assessment Pain Assessment: No/denies pain    Home Living Family/patient expects to be discharged to:: Private residence Living Arrangements: Spouse/significant other;Other relatives Available Help at Discharge: Family Type of Home: Apartment (describes as small efficiency one room) Home Access: Stairs to  enter Entrance Stairs-Rails: None Entrance Stairs-Number of Steps: 1   Home Layout: One level Home Equipment: Agricultural consultant (2 wheels) Additional Comments: 2 L/min supplemental O2    Prior Function Prior Level of Function : Needs assist             Mobility Comments: pt reports use of rollator for community navigation and pt is limited on going out to MD appointments only, no AD in room due to space and pt states furniture walks ADLs Comments: pt reports spouse assists with IADLs and some ADLs, pt states to have PCA     Extremity/Trunk Assessment        Lower Extremity Assessment Lower Extremity Assessment: Overall WFL for tasks assessed    Cervical / Trunk Assessment Cervical / Trunk Assessment: Normal  Communication   Communication Communication: No apparent difficulties  Cognition Arousal: Alert Behavior During Therapy: WFL for tasks assessed/performed Overall Cognitive Status: Within Functional Limits for tasks assessed                                          General Comments      Exercises     Assessment/Plan    PT Assessment Patient needs continued PT services  PT Problem List Cardiopulmonary status limiting activity;Decreased mobility;Decreased activity tolerance;Decreased balance       PT Treatment Interventions DME instruction;Gait training;Stair training;Functional mobility training;Therapeutic activities;Therapeutic exercise;Balance training;Neuromuscular re-education;Patient/family education    PT Goals (Current goals can be found in the Care Plan section)  Acute Rehab PT Goals Patient Stated Goal: to be able to find a better place to stay PT Goal Formulation: With patient Time For Goal Achievement: 03/04/23 Potential to Achieve Goals: Good    Frequency Min 1X/week     Co-evaluation               AM-PAC PT "6 Clicks" Mobility  Outcome Measure Help needed turning from your back to your side while in a flat bed  without using bedrails?: None Help needed moving from lying on your back to sitting on the side of a flat bed without using bedrails?: None Help needed moving to and from a bed to a chair (including a wheelchair)?: None Help needed standing up from a chair using your arms (e.g., wheelchair or bedside chair)?: None Help needed to walk in hospital room?: A Little Help needed climbing 3-5 steps with a railing? : A Lot 6 Click Score: 21    End of Session Equipment Utilized During Treatment: Gait belt;Oxygen Activity Tolerance: Patient limited by fatigue (SOB) Patient left: in chair;with call bell/phone within reach Nurse Communication: Mobility status;Other (comment) (requested [place] tube extension so pt does not remove supplemental O2 when transfering to bathroom) PT Visit Diagnosis: Unsteadiness on feet (R26.81);Other abnormalities of gait and mobility (R26.89);History of falling (Z91.81)    Time:  4696-2952 PT Time Calculation (min) (ACUTE ONLY): 19 min   Charges:   PT Evaluation $PT Eval Low Complexity: 1 Low   PT General Charges $$ ACUTE PT VISIT: 1 Visit         Johnny Bridge, PT Acute Rehab   Jacqualyn Posey 02/18/2023, 3:43 PM

## 2023-02-19 DIAGNOSIS — J441 Chronic obstructive pulmonary disease with (acute) exacerbation: Secondary | ICD-10-CM | POA: Diagnosis not present

## 2023-02-19 DIAGNOSIS — J189 Pneumonia, unspecified organism: Secondary | ICD-10-CM | POA: Diagnosis not present

## 2023-02-19 DIAGNOSIS — J180 Bronchopneumonia, unspecified organism: Secondary | ICD-10-CM | POA: Diagnosis not present

## 2023-02-19 DIAGNOSIS — I10 Essential (primary) hypertension: Secondary | ICD-10-CM | POA: Diagnosis not present

## 2023-02-19 LAB — CBC WITH DIFFERENTIAL/PLATELET
Abs Immature Granulocytes: 0.08 10*3/uL — ABNORMAL HIGH (ref 0.00–0.07)
Basophils Absolute: 0.1 10*3/uL (ref 0.0–0.1)
Basophils Relative: 0 %
Eosinophils Absolute: 0.1 10*3/uL (ref 0.0–0.5)
Eosinophils Relative: 1 %
HCT: 42.5 % (ref 36.0–46.0)
Hemoglobin: 13.3 g/dL (ref 12.0–15.0)
Immature Granulocytes: 1 %
Lymphocytes Relative: 39 %
Lymphs Abs: 4.7 10*3/uL — ABNORMAL HIGH (ref 0.7–4.0)
MCH: 28.5 pg (ref 26.0–34.0)
MCHC: 31.3 g/dL (ref 30.0–36.0)
MCV: 91.2 fL (ref 80.0–100.0)
Monocytes Absolute: 1 10*3/uL (ref 0.1–1.0)
Monocytes Relative: 8 %
Neutro Abs: 6.2 10*3/uL (ref 1.7–7.7)
Neutrophils Relative %: 51 %
Platelets: 334 10*3/uL (ref 150–400)
RBC: 4.66 MIL/uL (ref 3.87–5.11)
RDW: 15.7 % — ABNORMAL HIGH (ref 11.5–15.5)
WBC: 12.1 10*3/uL — ABNORMAL HIGH (ref 4.0–10.5)
nRBC: 0 % (ref 0.0–0.2)

## 2023-02-19 LAB — BASIC METABOLIC PANEL
Anion gap: 12 (ref 5–15)
BUN: 12 mg/dL (ref 6–20)
CO2: 24 mmol/L (ref 22–32)
Calcium: 9.2 mg/dL (ref 8.9–10.3)
Chloride: 104 mmol/L (ref 98–111)
Creatinine, Ser: 0.75 mg/dL (ref 0.44–1.00)
GFR, Estimated: >60 mL/min (ref >60)
Glucose, Bld: 112 mg/dL — ABNORMAL HIGH (ref 70–99)
Potassium: 3.8 mmol/L (ref 3.5–5.1)
Sodium: 140 mmol/L (ref 135–145)

## 2023-02-19 LAB — GLUCOSE, CAPILLARY
Glucose-Capillary: 93 mg/dL (ref 70–99)
Glucose-Capillary: 102 mg/dL — ABNORMAL HIGH (ref 70–99)
Glucose-Capillary: 162 mg/dL — ABNORMAL HIGH (ref 70–99)
Glucose-Capillary: 142 mg/dL — ABNORMAL HIGH (ref 70–99)

## 2023-02-19 NOTE — Plan of Care (Signed)
  Problem: Education: Goal: Knowledge of General Education information will improve Description: Including pain rating scale, medication(s)/side effects and non-pharmacologic comfort measures Outcome: Progressing   Problem: Health Behavior/Discharge Planning: Goal: Ability to manage health-related needs will improve Outcome: Progressing   Problem: Clinical Measurements: Goal: Ability to maintain clinical measurements within normal limits will improve Outcome: Progressing Goal: Will remain free from infection Outcome: Progressing Goal: Diagnostic test results will improve Outcome: Progressing Goal: Respiratory complications will improve Outcome: Progressing Goal: Cardiovascular complication will be avoided Outcome: Progressing   Problem: Activity: Goal: Risk for activity intolerance will decrease Outcome: Progressing   Problem: Nutrition: Goal: Adequate nutrition will be maintained Outcome: Progressing   Problem: Coping: Goal: Level of anxiety will decrease Outcome: Progressing   Problem: Elimination: Goal: Will not experience complications related to bowel motility Outcome: Progressing Goal: Will not experience complications related to urinary retention Outcome: Progressing   Problem: Pain Managment: Goal: General experience of comfort will improve and/or be controlled Outcome: Progressing   Problem: Safety: Goal: Ability to remain free from injury will improve Outcome: Progressing   Problem: Skin Integrity: Goal: Risk for impaired skin integrity will decrease Outcome: Progressing   Problem: Education: Goal: Knowledge of disease or condition will improve Outcome: Progressing Goal: Knowledge of the prescribed therapeutic regimen will improve Outcome: Progressing Goal: Individualized Educational Video(s) Outcome: Progressing   Problem: Activity: Goal: Ability to tolerate increased activity will improve Outcome: Progressing Goal: Will verbalize the  importance of balancing activity with adequate rest periods Outcome: Progressing   Problem: Respiratory: Goal: Ability to maintain a clear airway will improve Outcome: Progressing Goal: Levels of oxygenation will improve Outcome: Progressing Goal: Ability to maintain adequate ventilation will improve Outcome: Progressing   Problem: Activity: Goal: Ability to tolerate increased activity will improve Outcome: Progressing   Problem: Clinical Measurements: Goal: Ability to maintain a body temperature in the normal range will improve Outcome: Progressing   Problem: Respiratory: Goal: Ability to maintain adequate ventilation will improve Outcome: Progressing Goal: Ability to maintain a clear airway will improve Outcome: Progressing   Problem: Education: Goal: Ability to describe self-care measures that may prevent or decrease complications (Diabetes Survival Skills Education) will improve Outcome: Progressing Goal: Individualized Educational Video(s) Outcome: Progressing   Problem: Coping: Goal: Ability to adjust to condition or change in health will improve Outcome: Progressing   Problem: Fluid Volume: Goal: Ability to maintain a balanced intake and output will improve Outcome: Progressing   Problem: Health Behavior/Discharge Planning: Goal: Ability to identify and utilize available resources and services will improve Outcome: Progressing Goal: Ability to manage health-related needs will improve Outcome: Progressing   Problem: Metabolic: Goal: Ability to maintain appropriate glucose levels will improve Outcome: Progressing   Problem: Nutritional: Goal: Maintenance of adequate nutrition will improve Outcome: Progressing Goal: Progress toward achieving an optimal weight will improve Outcome: Progressing   Problem: Skin Integrity: Goal: Risk for impaired skin integrity will decrease Outcome: Progressing   Problem: Tissue Perfusion: Goal: Adequacy of tissue  perfusion will improve Outcome: Progressing

## 2023-02-19 NOTE — Progress Notes (Signed)
   02/19/23 2202  BiPAP/CPAP/SIPAP  BiPAP/CPAP/SIPAP Pt Type Adult  BiPAP/CPAP/SIPAP Resmed  Mask Type Full face mask  Mask Size Medium  Flow Rate 3 lpm  Patient Home Equipment No  Auto Titrate Yes (12/5)  CPAP/SIPAP surface wiped down Yes  BiPAP/CPAP /SiPAP Vitals  Pulse Rate 76  Resp 16  SpO2 97 %  Bilateral Breath Sounds Diminished  MEWS Score/Color  MEWS Score 0  MEWS Score Color [place] Si

## 2023-02-19 NOTE — Progress Notes (Signed)
[facility]                                                                               Denise [name], is a 53 y.o. female, DOB - Jul 28, 1970, [id number] Admit date - 02/17/2023    Outpatient Primary MD for the patient is [facility]  LOS - 1  days    Brief summary   Denise [name] is a 53 y.o. female with medical history significant for morbid obesity, diastolic heart failure, CVA, COPD, chronic hypoxic respiratory failure on 2 L [place], T2DM, OSA on CPAP, HTN, HLD, anxiety and depression who presented via EMS for evaluation of URI symptoms and admitted for community-acquired pneumonia and COPD exacerbation.    Assessment & Plan    Assessment and Plan:   Acute on chronic respiratory failure with hypoxia  on 2 to 3 lit/min in the setting of COPD/ OSA/OHS, Chronic bronchitis, ongoing tobacco abuse Secondary to community acquired pneumonia/ bronchopneumonia  Slowly improving.  Continue with IV antibiotics possible transition to oral antibiotics in am.  RSV, COVID and influenza PCR is negative.  Sputum cultures show normal flora.  Continue with prednisone and home bronchodilators.    H/o Chronic Diastolic Heart failure:  Resume home meds.   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    H/o Hemorrhagic CVA left basal ganglia On lipitor continue the same.    Hypertension:  BP parameters are well controlled.   Type 2 DM Non insulin dependent DM: Resume home meds.  A1c is 6.3%  Body mass index is 51.84 kg/m. Morbid Obesity Recommend outpatient follow up with PCP.     Anxiety and depression, PTSD:  Resume home meds.   H/o syphilis Cognitive dysfunction.    Therapy eval recommending SNF. But patient refusing   Estimated body mass index is 51.84 kg/m as calculated from the following:   Height as of this encounter: 5\' 5"  (1.651 m).   Weight as  of this encounter: 141.3 kg.  Code Status: full code.  DVT Prophylaxis:   Lovenox.   Level of Care: Level of care: Med-Surg Family Communication: none at bedside.   Disposition Plan:     Remains inpatient appropriate:  pending clinical improvement.   Procedures:  None.   Consultants:   None.   Antimicrobials:   Anti-infectives (From admission, onward)    Start     Dose/Rate Route Frequency Ordered Stop   02/18/23 1515  cefTRIAXone (ROCEPHIN) 1 g in sodium chloride 0.9 % 100 mL IVPB        1 g 200 mL/hr over 30 Minutes Intravenous Every 24 hours 02/17/23 2143 02/22/23 1514   02/18/23 1000  doxycycline (VIBRA-TABS) tablet 100 mg        100 mg Oral Every 12 hours 02/17/23 2143 02/22/23 0959   02/17/23 1515  cefTRIAXone (ROCEPHIN) 1 g in sodium chloride 0.9 % 100 mL IVPB        1 g 200 mL/hr over 30 Minutes Intravenous  Once 02/17/23 1508 02/17/23 1637   02/17/23 1515  doxycycline (VIBRA-TABS) tablet 100 mg        100  mg Oral  Once 02/17/23 1508 02/17/23 1532        Medications  Scheduled Meds:  atorvastatin  20 mg Oral Daily   carbamazepine  200 mg Oral BID   dextromethorphan-guaiFENesin  1 tablet Oral BID   doxycycline  100 mg Oral Q12H   empagliflozin  10 mg Oral Daily   enoxaparin (LOVENOX) injection  70 mg Subcutaneous QHS   furosemide  80 mg Oral q AM   gabapentin  900 mg Oral TID   insulin aspart  0-15 Units Subcutaneous TID WC   losartan  25 mg Oral Daily   metFORMIN  500 mg Oral BID WC   mometasone-formoterol  2 puff Inhalation BID   nicotine  14 mg Transdermal Daily   potassium chloride SA  20 mEq Oral Daily   prednisoLONE  40 mg Oral Daily   umeclidinium bromide  1 puff Inhalation Daily   Continuous Infusions:  cefTRIAXone (ROCEPHIN)  IV 1 g (02/19/23 1603)   PRN Meds:.acetaminophen **OR** acetaminophen, ipratropium-albuterol, ondansetron **OR** ondansetron (ZOFRAN) IV, senna-docusate    Subjective:   Denise [name] was seen and  examined today.  No chest pain, breathing has improved.   Objective:   Vitals:   02/19/23 0827 02/19/23 1245 02/19/23 1256 02/19/23 1308  BP:  (!) 156/82    Pulse:  84    Resp:  18    Temp:  97.7 F (36.5 C)    TempSrc:      SpO2: 99% (!) 88% 92% 92%  Weight:      Height:        Intake/Output Summary (Last 24 hours) at 02/19/2023 1738 Last data filed at 02/19/2023 1105 Gross per 24 hour  Intake 560.08 ml  Output --  Net 560.08 ml   Filed Weights   02/17/23 1049  Weight: (!) 141.3 kg     Exam General exam: Appears calm and comfortable  Respiratory system: Clear to auscultation. Respiratory effort normal. Cardiovascular system: S1 & S2 heard, RRR. No JVD, Gastrointestinal system: Abdomen is nondistended, soft and nontender Central nervous system: Alert and oriented. No focal neurological deficits. Extremities: Symmetric 5 x 5 power. Skin: No rashes,  Psychiatry:  Mood & affect appropriate.      Data Reviewed:  I have personally reviewed following labs and imaging studies   CBC Lab Results  Component Value Date   WBC 12.1 (H) 02/19/2023   RBC 4.66 02/19/2023   HGB 13.3 02/19/2023   HCT 42.5 02/19/2023   MCV 91.2 02/19/2023   MCH 28.5 02/19/2023   PLT 334 02/19/2023   MCHC 31.3 02/19/2023   RDW 15.7 (H) 02/19/2023   LYMPHSABS 4.7 (H) 02/19/2023   MONOABS 1.0 02/19/2023   EOSABS 0.1 02/19/2023   BASOSABS 0.1 02/19/2023     Last metabolic panel Lab Results  Component Value Date   NA 140 02/19/2023   K 3.8 02/19/2023   CL 104 02/19/2023   CO2 24 02/19/2023   BUN 12 02/19/2023   CREATININE 0.75 02/19/2023   GLUCOSE 112 (H) 02/19/2023   GFRNONAA >60 02/19/2023   GFRAA >60 09/27/2019   CALCIUM 9.2 02/19/2023   PROT 7.7 10/24/2022   ALBUMIN 4.0 10/24/2022   LABGLOB 2.2 10/30/2018   AGRATIO 2.0 10/30/2018   BILITOT 0.6 10/24/2022   ALKPHOS 98 10/24/2022   AST 26 10/24/2022   ALT 36 10/24/2022   ANIONGAP 12 02/19/2023    CBG (last 3)  Recent  Labs    02/19/23 0755 02/19/23 1147 02/19/23  1618  GLUCAP 93 102* 162*      Coagulation Profile: No results for input(s): "INR", "PROTIME" in the last 168 hours.   Radiology Studies: No results found.      Kathlen Mody M.D. [facility] 02/19/2023, 5:38 PM  Available via Epic secure chat 7am-7pm After 7 pm, please refer to night coverage provider listed on amion.

## 2023-02-19 NOTE — Plan of Care (Signed)
Problem: Education: Goal: Knowledge of General Education information will improve Description: Including pain rating scale, medication(s)/side effects and non-pharmacologic comfort measures Outcome: Progressing   Problem: Health Behavior/Discharge Planning: Goal: Ability to manage health-related needs will improve Outcome: Progressing   Problem: Clinical Measurements: Goal: Ability to maintain clinical measurements within normal limits will improve Outcome: Progressing Goal: Will remain free from infection Outcome: Progressing Goal: Diagnostic test results will improve Outcome: Progressing Goal: Respiratory complications will improve Outcome: Progressing Goal: Cardiovascular complication will be avoided Outcome: Progressing   Problem: Activity: Goal: Risk for activity intolerance will decrease Outcome: Progressing   Problem: Nutrition: Goal: Adequate nutrition will be maintained Outcome: Progressing   Problem: Coping: Goal: Level of anxiety will decrease Outcome: Progressing   Problem: Elimination: Goal: Will not experience complications related to bowel motility Outcome: Progressing Goal: Will not experience complications related to urinary retention Outcome: Progressing   Problem: Pain Managment: Goal: General experience of comfort will improve and/or be controlled Outcome: Progressing   Problem: Safety: Goal: Ability to remain free from injury will improve Outcome: Progressing   Problem: Skin Integrity: Goal: Risk for impaired skin integrity will decrease Outcome: Progressing   Problem: Education: Goal: Knowledge of disease or condition will improve Outcome: Progressing Goal: Knowledge of the prescribed therapeutic regimen will improve Outcome: Progressing Goal: Individualized Educational Video(s) Outcome: Progressing   Problem: Activity: Goal: Ability to tolerate increased activity will improve Outcome: Progressing Goal: Will verbalize the  importance of balancing activity with adequate rest periods Outcome: Progressing   Problem: Respiratory: Goal: Ability to maintain a clear airway will improve Outcome: Progressing Goal: Levels of oxygenation will improve Outcome: Progressing Goal: Ability to maintain adequate ventilation will improve Outcome: Progressing   Problem: Activity: Goal: Ability to tolerate increased activity will improve Outcome: Progressing   Problem: Clinical Measurements: Goal: Ability to maintain a body temperature in the normal range will improve Outcome: Progressing   Problem: Respiratory: Goal: Ability to maintain adequate ventilation will improve Outcome: Progressing Goal: Ability to maintain a clear airway will improve Outcome: Progressing   Problem: Education: Goal: Ability to describe self-care measures that may prevent or decrease complications (Diabetes Survival Skills Education) will improve Outcome: Progressing Goal: Individualized Educational Video(s) Outcome: Progressing   Problem: Coping: Goal: Ability to adjust to condition or change in health will improve Outcome: Progressing   Problem: Fluid Volume: Goal: Ability to maintain a balanced intake and output will improve Outcome: Progressing   Problem: Health Behavior/Discharge Planning: Goal: Ability to identify and utilize available resources and services will improve Outcome: Progressing Goal: Ability to manage health-related needs will improve Outcome: Progressing   Problem: Metabolic: Goal: Ability to maintain appropriate glucose levels will improve Outcome: Progressing   Problem: Nutritional: Goal: Maintenance of adequate nutrition will improve Outcome: Progressing Goal: Progress toward achieving an optimal weight will improve Outcome: Progressing   Problem: Skin Integrity: Goal: Risk for impaired skin integrity will decrease Outcome: Progressing   Problem: Tissue Perfusion: Goal: Adequacy of tissue  perfusion will improve Outcome: Progressing

## 2023-02-20 DIAGNOSIS — J441 Chronic obstructive pulmonary disease with (acute) exacerbation: Secondary | ICD-10-CM | POA: Diagnosis not present

## 2023-02-20 DIAGNOSIS — J180 Bronchopneumonia, unspecified organism: Secondary | ICD-10-CM | POA: Diagnosis not present

## 2023-02-20 DIAGNOSIS — J189 Pneumonia, unspecified organism: Secondary | ICD-10-CM | POA: Diagnosis not present

## 2023-02-20 LAB — CULTURE, RESPIRATORY W GRAM STAIN

## 2023-02-20 LAB — LEGIONELLA PNEUMOPHILA SEROGP 1 UR AG: L. pneumophila Serogp 1 Ur Ag: NEGATIVE

## 2023-02-20 LAB — GLUCOSE, CAPILLARY: Glucose-Capillary: 123 mg/dL — ABNORMAL HIGH (ref 70–99)

## 2023-02-20 MED ORDER — DM-GUAIFENESIN ER 30-600 MG PO TB12: 1.0000 | ORAL_TABLET | Freq: Two times a day (BID) | ORAL | 2 refills | Status: DC

## 2023-02-20 MED ORDER — AMOXICILLIN-POT CLAVULANATE 875-125 MG PO TABS: 1.0000 | ORAL_TABLET | Freq: Two times a day (BID) | ORAL | 0 refills | Status: AC

## 2023-02-20 MED ORDER — AMOXICILLIN-POT CLAVULANATE 875-125 MG PO TABS
1.0000 | ORAL_TABLET | Freq: Once | ORAL | Status: AC
  Administered 2023-02-20: 1 via ORAL
  Filled 2023-02-20: qty 1

## 2023-02-20 MED ORDER — SENNOSIDES-DOCUSATE SODIUM 8.6-50 MG PO TABS: 1.0000 | ORAL_TABLET | Freq: Every evening | ORAL | Status: DC | PRN

## 2023-02-20 MED ORDER — PREDNISOLONE 5 MG PO TABS: 40.0000 mg | ORAL_TABLET | Freq: Every day | ORAL | 0 refills | Status: AC

## 2023-02-20 MED ORDER — SODIUM CHLORIDE 0.9 % IV SOLN: 2.0000 g | Freq: Once | INTRAVENOUS | Status: DC

## 2023-02-20 NOTE — Progress Notes (Addendum)
AVS reviewed w/ pt who verbalized an understanding. PIV removed as noted. Pt states the discharge meds would be delivered to her home by [facility] tomorrow. RN emphasized the importance of not missing doses of her antibiotic. Pt stated she did not want to use another pharmacy even when nurse emphasized that she needed her antibiotic today. Pt states "I have my meds delivered and I pay them when I can" Dr Blake Divine notified via secure chat.  Dr Blake Divine will order a now dose of Augmentin for pt to take prior to d/c. Pt dressed, Niece enroute to pick up pt.

## 2023-02-20 NOTE — Progress Notes (Addendum)
One dose of PO augmentin given as a now dose due to pt not being able to get medications until tomorrow. See previous note regarding pt's refusal to use an alternate pharmacy today. Pt to ED entrance via w/c by this RN. Pt confirmed w/ this RN she has a walker at home.

## 2023-02-20 NOTE — Discharge Summary (Signed)
Physician Discharge Summary   Patient: Denise [name] MRN: [id number] DOB: 1970/10/03  Admit date:     02/17/2023  Discharge date: 02/20/2023  Discharge Physician: Kathlen Mody   PCP: [facility]   Recommendations at discharge:  PLEASE FOLLOW UP WITH PCP IN ONE WEEK.  PLEASE CHECK CXR IN 2 TO 4 WEEKS FOR EVALUATION OF RESOLUTION OF PNEUMONIA.   Discharge Diagnoses: Principal Problem:   Community acquired pneumonia Active Problems:   Bronchopneumonia   COPD with acute exacerbation (HCC)   CAP (community acquired pneumonia)  Resolved Problems:   * No resolved hospital problems. *  Hospital Course: Denise [name] is a 53 y.o. female with medical history significant for morbid obesity, diastolic heart failure, CVA, COPD, chronic hypoxic respiratory failure on 2 L [place], T2DM, OSA on CPAP, HTN, HLD, anxiety and depression who presented via EMS for evaluation of URI symptoms and admitted for community-acquired pneumonia and COPD exacerbation.   Assessment and Plan:    Acute on chronic respiratory failure with hypoxia  on 2 to 3 lit/min in the setting of COPD/ OSA/OHS, Chronic bronchitis, ongoing tobacco abuse Secondary to community acquired pneumonia/ bronchopneumonia   Slowly improving.  Continue with IV antibiotics possible transition to oral antibiotics in am.  RSV, COVID and influenza PCR is negative.  Sputum cultures show normal flora.  Continue with prednisone and home bronchodilators.      H/o Chronic Diastolic Heart failure:  Resume home meds.    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      H/o Hemorrhagic CVA left basal ganglia On lipitor continue the same.      Hypertension:  BP parameters are well controlled.    Type 2 DM Non insulin dependent DM: Resume home meds.  A1c is 6.3%   Body mass index is 51.84 kg/m. Morbid Obesity Recommend outpatient follow up  with PCP.        Anxiety and depression, PTSD:  Resume home meds.     H/o syphilis Cognitive dysfunction.     Therapy eval recommending SNF. But patient refusing    Estimated body mass index is 51.84 kg/m as calculated from the following:   Height as of this encounter: 5\' 5"  (1.651 m).   Weight as of this encounter: 141.3 kg.  Consultants: none.  Procedures performed: none.  Disposition: Home Diet recommendation:  Discharge Diet Orders (From admission, onward)     Start     Ordered   02/20/23 0000  Diet - low sodium heart healthy        02/20/23 1019           Carb modified diet DISCHARGE MEDICATION: Allergies as of 02/20/2023       Reactions   Gadolinium Derivatives Nausea And Vomiting, Other (See Comments)   Nausea and vomiting was followed by epileptic seizure episode that lasted approximately 5 minutes; pt was unable to verbally communicate during that time; she then came to and was able to speak; was evaluated by Rad and RN; kms    Grapefruit Flavor [flavoring Agent (non-screening)] Other (See Comments)   Drug interaction        Medication List     TAKE these medications    AeroChamber MV inhaler Use as instructed   amoxicillin-clavulanate 875-125 MG tablet Commonly known as: AUGMENTIN Take 1 tablet by mouth 2 (two) times daily for 3 days.   atorvastatin 20 MG tablet Commonly known as: LIPITOR TAKE 1 TABLET  BY MOUTH EVERY DAY   Bayer Back & Body Pain Ex St 500-32.5 MG Tabs Generic drug: Aspirin-Caffeine Take 1-2 tablets by mouth every 6 (six) hours as needed (for pain or discomfort).   Breztri Aerosphere 160-9-4.8 MCG/ACT Aero Generic drug: Budeson-Glycopyrrol-Formoterol Inhale 2 puffs into the lungs in the morning and at bedtime.   carbamazepine 200 MG tablet Commonly known as: TEGRETOL Take 1 tablet (200 mg total) by mouth 2 (two) times daily.   Cold & Flu 5-10-200-325 MG Caps Generic drug: Phenylephrine-DM-GG-APAP Take 1-2 capsules by  mouth at bedtime.   dextromethorphan-guaiFENesin 30-600 MG 12hr tablet Commonly known as: MUCINEX DM Take 1 tablet by mouth 2 (two) times daily.   DM-Phenylephrine-Acetaminophen 10-5-325 MG/15ML Liqd Take 15 mLs by mouth every 6 (six) hours as needed (for flu-like symptoms).   furosemide 80 MG tablet Commonly known as: LASIX TAKE 1 TABLET BY MOUTH EVERY DAY What changed: when to take this   gabapentin 300 MG capsule Commonly known as: NEURONTIN TAKE 3 CAPSULES BY MOUTH 3 TIMES DAILY What changed:  how much to take how to take this when to take this additional instructions   Jardiance 10 MG Tabs tablet Generic drug: empagliflozin Take 1 tablet (10 mg total) by mouth daily.   losartan 25 MG tablet Commonly known as: COZAAR Take 1 tablet (25 mg total) by mouth daily.   metFORMIN 500 MG tablet Commonly known as: GLUCOPHAGE Take 500 mg by mouth 2 (two) times daily.   nicotine 14 mg/24hr patch Commonly known as: NICODERM CQ - dosed in mg/24 hours Place 1 patch (14 mg total) onto the skin daily.   PEG 3350 17 GM/SCOOP Powd Take 17 g by mouth daily as needed (constipation).   potassium chloride SA 20 MEQ tablet Commonly known as: KLOR-CON M Take 1 tablet (20 mEq total) by mouth daily.   prednisoLONE 5 MG Tabs tablet Take 8 tablets (40 mg total) by mouth daily for 3 days. Start taking on: February 21, 2023   senna-docusate 8.6-50 MG tablet Commonly known as: Senokot-S Take 1 tablet by mouth at bedtime as needed for mild constipation.   Ventolin HFA 108 (90 Base) MCG/ACT inhaler Generic drug: albuterol inhale 2 PUFFS into THE lungs EVERY 6 HOURS AS NEEDED FOR WHEEZING OR SHORTNESS OF BREATH What changed: when to take this        Discharge Exam: Filed Weights   02/17/23 1049  Weight: (!) 141.3 kg   General exam: Appears calm and comfortable  Respiratory system: Clear to auscultation. Respiratory effort normal. Cardiovascular system: S1 & S2 heard, RRR.   Gastrointestinal system: Abdomen is nondistended, soft and nontender.  Central nervous system: Alert and oriented. No focal neurological deficits. Extremities: Symmetric 5 x 5 power. Skin: No rashes, lesions or ulcers Psychiatry: Mood & affect appropriate.    Condition at discharge: fair  The results of significant diagnostics from this hospitalization (including imaging, microbiology, ancillary and laboratory) are listed below for reference.   Imaging Studies: DG Chest 2 View Result Date: 02/17/2023 CLINICAL DATA:  Cough and shortness of breath EXAM: CHEST - 2 VIEW COMPARISON:  Chest radiograph dated 12/08/2022 FINDINGS: Normal lung volumes. Subtle patchy bibasilar opacities. No pleural effusion or pneumothorax. Mildly enlarged cardiomediastinal silhouette. No acute osseous abnormality. IMPRESSION: 1. Subtle patchy bibasilar opacities, which may represent atypical infection/bronchopneumonia. 2. Mildly enlarged cardiomediastinal silhouette. Electronically Signed   By: Agustin Cree M.D.   On: 02/17/2023 14:38    Microbiology: Results for orders placed or performed during  the hospital encounter of 02/17/23  Resp panel by RT-PCR (RSV, Flu A&B, Covid) Anterior Nasal Swab     Status: None   Collection Time: 02/17/23  1:08 PM   Specimen: Anterior Nasal Swab  Result Value Ref Range Status   SARS Coronavirus 2 by RT PCR NEGATIVE NEGATIVE Final    Comment: (NOTE) SARS-CoV-2 target nucleic acids are NOT DETECTED.  The SARS-CoV-2 RNA is generally detectable in upper respiratory specimens during the acute phase of infection. The lowest concentration of SARS-CoV-2 viral copies this assay can detect is 138 copies/mL. A negative result does not preclude SARS-Cov-2 infection and should not be used as the sole basis for treatment or other patient management decisions. A negative result may occur with  improper specimen collection/handling, submission of specimen other than nasopharyngeal swab,  presence of viral mutation(s) within the areas targeted by this assay, and inadequate number of viral copies(<138 copies/mL). A negative result must be combined with clinical observations, patient history, and epidemiological information. The expected result is Negative.  Fact Sheet for Patients:  BloggerCourse.com  Fact Sheet for Healthcare Providers:  SeriousBroker.it  This test is no t yet approved or cleared by the [place] FDA and  has been authorized for detection and/or diagnosis of SARS-CoV-2 by FDA under an Emergency Use Authorization (EUA). This EUA will remain  in effect (meaning this test can be used) for the duration of the COVID-19 declaration under Section 564(b)(1) of the Act, 21 U.S.C.section 360bbb-3(b)(1), unless the authorization is terminated  or revoked sooner.       Influenza A by PCR NEGATIVE NEGATIVE Final   Influenza B by PCR NEGATIVE NEGATIVE Final    Comment: (NOTE) The Xpert Xpress SARS-CoV-2/FLU/RSV plus assay is intended as an aid in the diagnosis of influenza from Nasopharyngeal swab specimens and should not be used as a sole basis for treatment. Nasal washings and aspirates are unacceptable for Xpert Xpress SARS-CoV-2/FLU/RSV testing.  Fact Sheet for Patients: BloggerCourse.com  Fact Sheet for Healthcare Providers: SeriousBroker.it  This test is not yet approved or cleared by the [place] FDA and has been authorized for detection and/or diagnosis of SARS-CoV-2 by FDA under an Emergency Use Authorization (EUA). This EUA will remain in effect (meaning this test can be used) for the duration of the COVID-19 declaration under Section 564(b)(1) of the Act, 21 U.S.C. section 360bbb-3(b)(1), unless the authorization is terminated or revoked.     Resp Syncytial Virus by PCR NEGATIVE NEGATIVE Final    Comment: (NOTE) Fact Sheet for  Patients: BloggerCourse.com  Fact Sheet for Healthcare Providers: SeriousBroker.it  This test is not yet approved or cleared by the [place] FDA and has been authorized for detection and/or diagnosis of SARS-CoV-2 by FDA under an Emergency Use Authorization (EUA). This EUA will remain in effect (meaning this test can be used) for the duration of the COVID-19 declaration under Section 564(b)(1) of the Act, 21 U.S.C. section 360bbb-3(b)(1), unless the authorization is terminated or revoked.  Performed at [facility], [address], [place], [place] [address]   Respiratory (~20 pathogens) panel by PCR     Status: None   Collection Time: 02/17/23  1:08 PM   Specimen: Nasopharyngeal Swab; Respiratory  Result Value Ref Range Status   Adenovirus NOT DETECTED NOT DETECTED Final   Coronavirus 229E NOT DETECTED NOT DETECTED Final    Comment: (NOTE) The Coronavirus on the Respiratory Panel, DOES NOT test for the novel  Coronavirus (2019 nCoV)  Coronavirus HKU1 NOT DETECTED NOT DETECTED Final   Coronavirus NL63 NOT DETECTED NOT DETECTED Final   Coronavirus OC43 NOT DETECTED NOT DETECTED Final   Metapneumovirus NOT DETECTED NOT DETECTED Final   Rhinovirus / Enterovirus NOT DETECTED NOT DETECTED Final   Influenza A NOT DETECTED NOT DETECTED Final   Influenza B NOT DETECTED NOT DETECTED Final   Parainfluenza Virus 1 NOT DETECTED NOT DETECTED Final   Parainfluenza Virus 2 NOT DETECTED NOT DETECTED Final   Parainfluenza Virus 3 NOT DETECTED NOT DETECTED Final   Parainfluenza Virus 4 NOT DETECTED NOT DETECTED Final   Respiratory Syncytial Virus NOT DETECTED NOT DETECTED Final   Bordetella pertussis NOT DETECTED NOT DETECTED Final   Bordetella Parapertussis NOT DETECTED NOT DETECTED Final   Chlamydophila pneumoniae NOT DETECTED NOT DETECTED Final   Mycoplasma pneumoniae NOT DETECTED NOT DETECTED Final    Comment:  Performed at [facility] [place] [facility], [address] [place], [place] [address]  MRSA Next Gen by PCR, Nasal     Status: None   Collection Time: 02/17/23  5:17 PM   Specimen: Nasal Mucosa; Nasal Swab  Result Value Ref Range Status   MRSA by PCR Next Gen NOT DETECTED NOT DETECTED Final    Comment: (NOTE) The GeneXpert MRSA Assay (FDA approved for NASAL specimens only), is one component of a comprehensive MRSA colonization surveillance program. It is not intended to diagnose MRSA infection nor to guide or monitor treatment for MRSA infections. Test performance is not FDA approved in patients less than 69 years old. Performed at [facility], [address], [place], [place] [address]   Expectorated Sputum Assessment w Gram Stain, Rflx to Resp Cult     Status: None   Collection Time: 02/17/23  6:38 PM   Specimen: Expectorated Sputum  Result Value Ref Range Status   Specimen Description EXPECTORATED SPUTUM  Final   Special Requests NONE  Final   Sputum evaluation   Final    THIS SPECIMEN IS ACCEPTABLE FOR SPUTUM CULTURE Performed at [facility], [address] [place], [place] [address]    Report Status 02/17/2023 FINAL  Final  Culture, Respiratory w Gram Stain     Status: None   Collection Time: 02/17/23  6:38 PM  Result Value Ref Range Status   Specimen Description   Final    EXPECTORATED SPUTUM Performed at [facility], [address], [place], [place] [address]    Special Requests   Final    NONE Reflexed from [phone] Performed at [facility], [address] [place], [place] [address]    Gram Stain   Final    FEW WBC PRESENT, PREDOMINANTLY PMN FEW GRAM POSITIVE COCCI FEW GRAM NEGATIVE RODS    Culture   Final    Normal respiratory flora-no Staph aureus or Pseudomonas seen Performed at [facility], [address], [place], [place] [address]    Report Status 02/20/2023 FINAL  Final     Labs: CBC: Recent Labs  Lab 02/17/23 1320 02/18/23 0409 02/19/23 0515  WBC 13.5* 11.4* 12.1*  NEUTROABS 9.1*  --  6.2  HGB 14.6 13.4 13.3  HCT 45.9 42.9 42.5  MCV 90.4 89.7 91.2  PLT 360 350 334   Basic Metabolic Panel: Recent Labs  Lab 02/17/23 1320 02/18/23 0409 02/19/23 0515  NA 141 137 140  K 4.7 4.4 3.8  CL 101 101 104  CO2 28 26 24   GLUCOSE 83 128*  112*  BUN 7 9 12   CREATININE 0.81 0.65 0.75  CALCIUM 9.4 9.2 9.2   Liver Function Tests: No results for input(s): "AST", "ALT", "ALKPHOS", "BILITOT", "PROT", "ALBUMIN" in the last 168 hours. CBG: Recent Labs  Lab 02/19/23 0755 02/19/23 1147 02/19/23 1618 02/19/23 2024 02/20/23 0833  GLUCAP 93 102* 162* 142* 123*    Discharge time spent: 39 minutes.   Signed: Kathlen Mody, MD [facility] 02/20/2023

## 2023-02-21 ENCOUNTER — Other Ambulatory Visit (HOSPITAL_COMMUNITY): Payer: Self-pay

## 2023-02-28 ENCOUNTER — Ambulatory Visit: Payer: Medicaid Other | Admitting: Pulmonary Disease

## 2023-02-28 ENCOUNTER — Encounter: Payer: Self-pay | Admitting: Nurse Practitioner

## 2023-02-28 ENCOUNTER — Ambulatory Visit (INDEPENDENT_AMBULATORY_CARE_PROVIDER_SITE_OTHER): Payer: Medicaid Other | Admitting: Nurse Practitioner

## 2023-02-28 VITALS — BP 134/76 | HR 85 | Temp 97.7°F | Ht 65.0 in | Wt 318.0 lb

## 2023-02-28 DIAGNOSIS — Z720 Tobacco use: Secondary | ICD-10-CM

## 2023-02-28 DIAGNOSIS — J9611 Chronic respiratory failure with hypoxia: Secondary | ICD-10-CM | POA: Diagnosis not present

## 2023-02-28 DIAGNOSIS — J432 Centrilobular emphysema: Secondary | ICD-10-CM | POA: Diagnosis not present

## 2023-02-28 DIAGNOSIS — R0609 Other forms of dyspnea: Secondary | ICD-10-CM

## 2023-02-28 DIAGNOSIS — E66813 Obesity, class 3: Secondary | ICD-10-CM

## 2023-02-28 DIAGNOSIS — J411 Mucopurulent chronic bronchitis: Secondary | ICD-10-CM

## 2023-02-28 DIAGNOSIS — J189 Pneumonia, unspecified organism: Secondary | ICD-10-CM

## 2023-02-28 DIAGNOSIS — I5032 Chronic diastolic (congestive) heart failure: Secondary | ICD-10-CM

## 2023-02-28 DIAGNOSIS — Z6841 Body Mass Index (BMI) 40.0 and over, adult: Secondary | ICD-10-CM

## 2023-02-28 DIAGNOSIS — G4733 Obstructive sleep apnea (adult) (pediatric): Secondary | ICD-10-CM

## 2023-02-28 LAB — PULMONARY FUNCTION TEST
DL/VA % pred: 82 %
DL/VA: 3.49 ml/min/mmHg/L
DLCO cor % pred: 58 %
DLCO cor: 12.45 ml/min/mmHg
DLCO unc % pred: 57 %
DLCO unc: 12.41 ml/min/mmHg
FEF 25-75 Post: 2.32 L/s
FEF 25-75 Pre: 2.02 L/s
FEF2575-%Change-Post: 14 %
FEF2575-%Pred-Post: 87 %
FEF2575-%Pred-Pre: 76 %
FEV1-%Change-Post: 3 %
FEV1-%Pred-Post: 57 %
FEV1-%Pred-Pre: 55 %
FEV1-Post: 1.62 L
FEV1-Pre: 1.56 L
FEV1FVC-%Change-Post: 3 %
FEV1FVC-%Pred-Pre: 106 %
FEV6-%Change-Post: 0 %
FEV6-%Pred-Post: 53 %
FEV6-%Pred-Pre: 53 %
FEV6-Post: 1.85 L
FEV6-Pre: 1.85 L
FEV6FVC-%Pred-Post: 103 %
FEV6FVC-%Pred-Pre: 103 %
FVC-%Change-Post: 0 %
FVC-%Pred-Post: 51 %
FVC-%Pred-Pre: 51 %
FVC-Post: 1.85 L
FVC-Pre: 1.85 L
Post FEV1/FVC ratio: 88 %
Post FEV6/FVC ratio: 100 %
Pre FEV1/FVC ratio: 84 %
Pre FEV6/FVC Ratio: 100 %
RV % pred: 80 %
RV: 1.55 L
TLC % pred: 69 %
TLC: 3.64 L

## 2023-02-28 MED ORDER — BREZTRI AEROSPHERE 160-9-4.8 MCG/ACT IN AERO: 2.0000 | INHALATION_SPRAY | Freq: Two times a day (BID) | RESPIRATORY_TRACT | 11 refills | Status: DC

## 2023-02-28 MED ORDER — BREZTRI AEROSPHERE 160-9-4.8 MCG/ACT IN AERO: 2.0000 | INHALATION_SPRAY | Freq: Two times a day (BID) | RESPIRATORY_TRACT | Status: DC

## 2023-02-28 NOTE — Assessment & Plan Note (Signed)
Recent admission for CAP. Completed abx therapy around 1/29. Clinically improved. Plan to repeat CXR in 4-6 weeks to ensure resolution. Strict return/ED precautions. Close follow up. Continue mucociliary clearance therapies.   Patient Instructions  Restart Breztri 2 puffs Twice daily. Brush tongue and rinse mouth afterwards. When you run out of the samples, you have a new prescription at your pharmacy  Continue Albuterol inhaler 2 puffs every 6 hours as needed for shortness of breath or wheezing. Notify if symptoms persist despite rescue inhaler/neb use.  Continue supplemental oxygen 3 lpm on POC with activity and 2 lpm at night with CPAP. Goal >88-90% Continue CPAP nightly, minimum of 4-6 hours a night Continue Guaifenesin [phone] mg Twice daily for cough/congestion     Follow up in 6 weeks after CXR with Denise [name] (1sT) or Katie Cecile Guevara,NP. If symptoms do not improve or worsen, please contact office for sooner follow up or seek emergency care.

## 2023-02-28 NOTE — Assessment & Plan Note (Signed)
No formal obstruction on PFTs. She does have a moderate diffusion defect, which is likely more reflective of her underlying lung disease. She receives benefit from inhaler therapy. Will provide her with Breztri samples. New rx sent. Educated to contact her pharmacy for refills in the future. Verbalized understanding. Side effect profile again reviewed. Action plan in place. Smoking cessation strongly advised.

## 2023-02-28 NOTE — Assessment & Plan Note (Signed)
 Stable without increased O2 requirement. Goal >88-90%

## 2023-02-28 NOTE — Patient Instructions (Signed)
 Full PFT performed today.

## 2023-02-28 NOTE — Progress Notes (Signed)
@Patient  ID: Denise [name], female    DOB: September 01, 1970, 53 y.o.   MRN: [id number]  Chief Complaint  Patient presents with   Follow-up    Review PFT from today.  Hospitalization pneumonia 02/17/2023.  SOB with exertion persistent.    Referring provider: [facility]  HPI: 53 year old female, active smoker followed for OSA/OHS, chronic bronchitis/emphysema, chronic respiratory failure.  She was previously followed by Dr. Craige Cotta and last seen in office 12/08/2022 by Allison Quarry NP.  Past medical history significant for CHF, hemorrhagic CVA, psychogenic seizures, PTSD, depression, anxiety, hypertension, history of syphilis, HLD, overactive bladder, COVID-16 March 2019, obesity, DM, hx of substance abuse, cognitive dysfunction.  TEST/EVENTS:  10/07/2017 HST: AHI 25, SpO2 low 69% 08/23/2019 PSG: AHI 10.6/h, SpO2 low 80% >> CPAP 17 cmH2O/2 lpm O2 04/17/2020 PFT: FVC 68, FEV1 75, ratio 91, TLC 68, DLCOcor 60. No BD 07/17/2021 echo: EF 60 to 65%.  RV size and function normal. 07/29/2021 CTA chest: atherosclerosis, minimal atelectasis  10/24/2022 CXR: lungs are clear  10/24/2022 CT abd: visualized lung fields, tiny 2 mm RML. Mild pleural thickening in b/l lower lobes 02/17/2023 CXR: subtle patchy opacities, possibly atypical infection/bronchopneumonia  09/21/2021: OV with Dr. Craige Cotta.  Hospitalized a couple times since she was seen last.  She was told that she needed to wear oxygen 24/7 but was only sent home with a few tanks that were not refilled or replaced.  Has a home oxygen concentrator.  Has not been able to use CPAP for a few months.  She had a break on her tubing.  Still smoking 2 to 3 cigarettes a day.  Has cough and congestion.  Brings up clear sputum.  Uses Symbicort which helps.  Recent echo was normal.  CT chest from July shows minimal atelectasis.  SpO2 on room air was down to 83% after walking.  She recovered on 3 L supplemental O2 with SpO2 staying above 90%.  Will arrange for  replacement supplies previously compliant with CPAP.  Smoking cessation advised.  12/08/2022: OV with Domino Holten NP The patient, with a history of OSA/OHS, chronic bronchitis and on home oxygen therapy, presents with worsening dyspnea and cough.  She had been seen in the emergency department on 10/24/2022 for flulike symptoms.  RVP was negative.  Imaging was unremarkable.  She was discharged home with supportive care measures and treated for thrush.  She feels like she has gotten a little bit worse since she was last seen in the emergency department.  She reports shortness of breath with minimal exertion, such as moving from the bed to the bathroom. The cough is productive with white sputum, a new symptom for the patient, though the exact onset is unclear. Occasional wheeze. She has issues with memory impairment that is not new. She reports no fevers, chills, or hemoptysis.  The patient also reports increased leg swelling in both legs, more than her usual baseline. She has been experiencing difficulty laying flat at night, even with her CPAP machine on, which has been causing concern for her spouse.  No chest pain, dizziness, palpitations, syncope. No calf pain.  The patient continues to smoke, though she has reduced her daily intake to eight cigarettes from a pack a day, and expresses a desire to quit. The patient has been using her Symbicort inhaler regularly, two puffs twice a day, and her albuterol inhaler once in the morning and once at night. She is also on home oxygen therapy, but reports that two liters  does not seem sufficient. She was walked at her last OV and required 3 lpm on POC but she did not remember this. She does need O2 renewal today.  11/09/2022-12/08/2022: CPAP 17 cmH2O 29/30 days; 97% >4 hr; average use 12 hr 47 min Leaks 95th 66.3 AHI 2.1  02/28/2023: Today - follow up Patient presents today for follow up. She was hospitalized 02/17/2023-02/20/2023 for acute on chronic respiratory failure  secondary to CAP. She was treated with antibiotics and steroids. RVP negative. Sputum cultures with normal flora. She was discharged on baseline supplemental oxygen. Today, she tells me she is feeling much better.  Feels like she is back to her normal.  Cough has mostly resolved.  Occasionally gets up a little bit of white phlegm.  Not having any further issues with her breathing.  She completed her antibiotics last week.  Back to 2 L/min supplemental O2.  We had tried her on Breztri, which she did feel like worked better for her.  She ran out of this and did not realize that she had any refills.  She has only been using the albuterol since has been on the hospital.  She did have repeat pulmonary function testing before our visit which showed a decline in lung function when compared to 2 years ago.  She has a moderate restriction and moderate diffusing defect.  Diffusion capacity is overall stable when compared to prior.  Allergies  Allergen Reactions   Gadolinium Derivatives Nausea And Vomiting and Other (See Comments)    Nausea and vomiting was followed by epileptic seizure episode that lasted approximately 5 minutes; pt was unable to verbally communicate during that time; she then came to and was able to speak; was evaluated by Rad and RN; kms    Grapefruit Flavor [Flavoring Agent (Non-Screening)] Other (See Comments)    Drug interaction    Immunization History  Administered Date(s) Administered   Influenza Split 11/22/2018   Influenza, Seasonal, Injecte, Preservative Fre 02/05/2020, 12/02/2020   Influenza,inj,Quad PF,6+ Mos 10/25/2016, 10/26/2019   Moderna Covid-19 Fall Seasonal Vaccine 27yrs & older 11/19/2022   PFIZER(Purple Top)SARS-COV-2 Vaccination 04/21/2019, 05/15/2019, 11/09/2019   PNEUMOCOCCAL CONJUGATE-20 06/15/2021   Tdap 06/15/2021   Zoster Recombinant(Shingrix) 06/15/2021    Past Medical History:  Diagnosis Date   Abnormal EKG    Arthritis    Atopic dermatitis 05/28/2016    Back pain 07/29/2021   Blood transfusion without reported diagnosis    Borderline glaucoma with ocular hypertension, 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   Chronic diastolic CHF (congestive heart failure) 07/29/2021   Class 3 obesity with alveolar hypoventilation, serious comorbidity, and body mass index (BMI) of 50.0 to 59.9 in adult 06/12/2020   CVA (cerebrovascular accident) 2017   2017 MRI - chronic left lenticulostriate territory hemorrhage or hemorrhagic infarct, with hemosiderin, and encephalomalacia affecting both the caudate and lentiform nucleus     Decreased mobility 06/15/2021   DOE (dyspnea on exertion) 06/12/2020   Essential hypertension 12/18/2014   Fibroid uterus 12/18/2014   Heart murmur, systolic 06/29/2021   History of alcohol abuse 02/16/2017   History of cocaine use 02/16/2017   History of latent syphilis 02/16/2017   Hyperlipidemia associated with type 2 diabetes mellitus 11/07/2020   Incontinence of urine    Lacunar infarction    MRI - chronic lacunar infarct within the posterior right lentiform nucleus.     Lithium toxicity 09/25/2019   Low back pain 11/21/2017   Lumbar degenerative disc disease    Major depressive disorder 02/16/2017   Nuclear  sclerosis of both eyes 10/20/2022   OSA on CPAP 06/12/2020   Overactive bladder    Pain due to onychomycosis of toenails of both feet 05/26/2020   Pelvic pain in female 12/18/2014   Psychogenic nonepileptic seizure 02/17/2017   PTSD (post-traumatic stress disorder)    Pulmonary emphysema 08/31/2021   QT prolongation 06/29/2021   Right sided weakness    Social anxiety disorder 11/03/2017   Syncope 02/07/2015   TIA (transient ischemic attack)    Tobacco use 04/14/2015   Type 2 diabetes mellitus 10/21/2017   Venous stasis dermatitis of both lower extremities 06/15/2021    Tobacco History: Social History   Tobacco Use  Smoking Status Every Day   Current packs/day: 0.40   Types: Cigarettes  Smokeless Tobacco Never   Tobacco Comments   Smoking 1 pack of cigarettes a week. 09/21/21 Tay   Patients states she is smoking about 8 cigarettes a day now. AB, CMA 12-08-2022    Ready to quit: Not Answered Counseling given: Not Answered Tobacco comments: Smoking 1 pack of cigarettes a week. 09/21/21 Tay Patients states she is smoking about 8 cigarettes a day now. AB, CMA 12-08-2022    Outpatient Medications Prior to Visit  Medication Sig Dispense Refill   atorvastatin (LIPITOR) 20 MG tablet TAKE 1 TABLET BY MOUTH EVERY DAY 90 tablet 3   BAYER BACK & BODY PAIN EX ST 500-32.5 MG TABS Take 1-2 tablets by mouth every 6 (six) hours as needed (for pain or discomfort).     carbamazepine (TEGRETOL) 200 MG tablet Take 1 tablet (200 mg total) by mouth 2 (two) times daily. 60 tablet 5   COLD & FLU 5-10-200-325 MG CAPS Take 1-2 capsules by mouth at bedtime.     dextromethorphan-guaiFENesin (MUCINEX DM) 30-600 MG 12hr tablet Take 1 tablet by mouth 2 (two) times daily. 60 tablet 2   DM-Phenylephrine-Acetaminophen 10-5-325 MG/15ML LIQD Take 15 mLs by mouth every 6 (six) hours as needed (for flu-like symptoms).     empagliflozin (JARDIANCE) 10 MG TABS tablet Take 1 tablet (10 mg total) by mouth daily. 30 tablet 1   furosemide (LASIX) 80 MG tablet TAKE 1 TABLET BY MOUTH EVERY DAY (Patient taking differently: Take 80 mg by mouth in the morning.) 90 tablet 2   gabapentin (NEURONTIN) 300 MG capsule TAKE 3 CAPSULES BY MOUTH 3 TIMES DAILY (Patient taking differently: Take 900 mg by mouth in the morning, at noon, and at bedtime.) 270 capsule 4   losartan (COZAAR) 25 MG tablet Take 1 tablet (25 mg total) by mouth daily. 30 tablet 1   metFORMIN (GLUCOPHAGE) 500 MG tablet Take 500 mg by mouth 2 (two) times daily.  3   Polyethylene Glycol 3350 (PEG 3350) 17 GM/SCOOP POWD Take 17 g by mouth daily as needed (constipation).     potassium chloride SA (KLOR-CON M) 20 MEQ tablet Take 1 tablet (20 mEq total) by mouth daily. 30 tablet 1    senna-docusate (SENOKOT-S) 8.6-50 MG tablet Take 1 tablet by mouth at bedtime as needed for mild constipation.     Spacer/Aero-Holding Chambers (AEROCHAMBER MV) inhaler Use as instructed 1 each 0   VENTOLIN HFA 108 (90 Base) MCG/ACT inhaler inhale 2 PUFFS into THE lungs EVERY 6 HOURS AS NEEDED FOR WHEEZING OR SHORTNESS OF BREATH (Patient taking differently: Inhale 2 puffs into the lungs in the morning and at bedtime.) 18 g 11   Budeson-Glycopyrrol-Formoterol (BREZTRI AEROSPHERE) 160-9-4.8 MCG/ACT AERO Inhale 2 puffs into the lungs in the morning and  at bedtime. 10.7 g 11   nicotine (NICODERM CQ - DOSED IN MG/24 HOURS) 14 mg/24hr patch Place 1 patch (14 mg total) onto the skin daily. (Patient not taking: Reported on 02/28/2023) 28 patch 0   No facility-administered medications prior to visit.     Review of Systems:   Constitutional: No weight loss or gain, night sweats, fevers, chills, or lassitude. HEENT: No headaches, difficulty swallowing, tooth/dental problems, or sore throat. No sneezing, itching, ear ache, nasal congestion, or post nasal drip CV:  +swelling in lower extremities (baseline). No PND, orthopnea, chest pain, anasarca, dizziness, palpitations, syncope Resp: +shortness of breath with exertion (improved); minimal productive cough. No wheezing. No hemoptysis. No chest wall deformity GI:  No heartburn, indigestion, abdominal pain, nausea, vomiting, diarrhea, change in bowel habits, loss of appetite, bloody stools.  GU: No dysuria, change in color of urine, urgency or frequency.  No flank pain, no hematuria  Skin: No rash, lesions, ulcerations MSK:  No joint pain or swelling.  Neuro: No dizziness or lightheadedness. +memory impairment (baseline) Psych: No depression or anxiety. Mood stable.     Physical Exam:  BP 134/76 (BP Location: Right Arm, Patient Position: Sitting, Cuff Size: Large)   Pulse 85   Temp 97.7 F (36.5 C) (Oral)   Ht 5\' 5"  (1.651 m)   Wt (!) 318 lb (144.2  kg)   LMP 05/29/2017 (Approximate)   SpO2 93% Comment: 2L POC pulsed oxygen  BMI 52.92 kg/m   GEN: Pleasant, interactive, chronically-ill appearing; morbidly obese; in no acute distress HEENT:  Normocephalic and atraumatic. PERRLA. Sclera white. Nasal turbinates pink, moist and patent bilaterally. No rhinorrhea present. Oropharynx pink and moist, without exudate or edema. No lesions, ulcerations, or postnasal drip.  NECK:  Supple w/ fair ROM. No JVD present. Normal carotid impulses w/o bruits. Thyroid symmetrical with no goiter or nodules palpated. No lymphadenopathy.   CV: RRR, no m/r/g, no peripheral edema. Pulses intact, +2 bilaterally. No cyanosis, pallor or clubbing. PULMONARY:  Unlabored, regular breathing. Diminished bibasilar airflow otherwise clear b/l A&P. No accessory muscle use.  GI: BS present and normoactive. Soft, non-tender to palpation. No organomegaly or masses detected. MSK: No erythema, warmth or tenderness. Cap refil <2 sec all extrem. No deformities or joint swelling noted.  Neuro: A/Ox3. No focal deficits noted.   Skin: Warm, no lesions or rashe Psych: Normal affect and behavior. Judgement and thought content appropriate.     Lab Results:  CBC    Component Value Date/Time   WBC 12.1 (H) 02/19/2023 0515   RBC 4.66 02/19/2023 0515   HGB 13.3 02/19/2023 0515   HGB 13.3 10/30/2018 0930   HCT 42.5 02/19/2023 0515   HCT 41.7 10/30/2018 0930   PLT 334 02/19/2023 0515   PLT 350 10/30/2018 0930   MCV 91.2 02/19/2023 0515   MCV 86 10/30/2018 0930   MCH 28.5 02/19/2023 0515   MCHC 31.3 02/19/2023 0515   RDW 15.7 (H) 02/19/2023 0515   RDW 15.9 (H) 10/30/2018 0930   LYMPHSABS 4.7 (H) 02/19/2023 0515   LYMPHSABS 2.5 10/30/2018 0930   MONOABS 1.0 02/19/2023 0515   EOSABS 0.1 02/19/2023 0515   EOSABS 0.1 10/30/2018 0930   BASOSABS 0.1 02/19/2023 0515   BASOSABS 0.0 10/30/2018 0930    BMET    Component Value Date/Time   NA 140 02/19/2023 0515   NA 142  06/29/2021 0857   K 3.8 02/19/2023 0515   CL 104 02/19/2023 0515   CO2 24 02/19/2023 0515  GLUCOSE 112 (H) 02/19/2023 0515   BUN 12 02/19/2023 0515   BUN 5 (L) 06/29/2021 0857   CREATININE 0.75 02/19/2023 0515   CALCIUM 9.2 02/19/2023 0515   GFRNONAA >60 02/19/2023 0515   GFRAA >60 09/27/2019 0447    BNP    Component Value Date/Time   BNP 37.3 02/17/2023 1649     Imaging:  DG Chest 2 View Result Date: 02/17/2023 CLINICAL DATA:  Cough and shortness of breath EXAM: CHEST - 2 VIEW COMPARISON:  Chest radiograph dated 12/08/2022 FINDINGS: Normal lung volumes. Subtle patchy bibasilar opacities. No pleural effusion or pneumothorax. Mildly enlarged cardiomediastinal silhouette. No acute osseous abnormality. IMPRESSION: 1. Subtle patchy bibasilar opacities, which may represent atypical infection/bronchopneumonia. 2. Mildly enlarged cardiomediastinal silhouette. Electronically Signed   By: Agustin Cree M.D.   On: 02/17/2023 14:38    Administration History     None          Latest Ref Rng & Units 02/28/2023    9:34 AM 04/17/2020    8:56 AM  PFT Results  FVC-Pre L 1.85  P 2.05   FVC-Predicted Pre % 51  P 68   FVC-Post L 1.85  P 1.97   FVC-Predicted Post % 51  P 65   Pre FEV1/FVC % % 84  P 88   Post FEV1/FCV % % 88  P 91   FEV1-Pre L 1.56  P 1.80   FEV1-Predicted Pre % 55  P 75   FEV1-Post L 1.62  P 1.79   DLCO uncorrected ml/min/mmHg 12.41  P 12.65   DLCO UNC% % 57  P 58   DLCO corrected ml/min/mmHg 12.45  P 13.11   DLCO COR %Predicted % 58  P 60   DLVA Predicted % 82  P 88   TLC L 3.64  P 3.54   TLC % Predicted % 69  P 68   RV % Predicted % 80  P 68     P Preliminary result    No results found for: "NITRICOXIDE"      Assessment & Plan:   CAP (community acquired pneumonia) Recent admission for CAP. Completed abx therapy around 1/29. Clinically improved. Plan to repeat CXR in 4-6 weeks to ensure resolution. Strict return/ED precautions. Close follow up. Continue  mucociliary clearance therapies.   Patient Instructions  Restart Breztri 2 puffs Twice daily. Brush tongue and rinse mouth afterwards. When you run out of the samples, you have a new prescription at your pharmacy  Continue Albuterol inhaler 2 puffs every 6 hours as needed for shortness of breath or wheezing. Notify if symptoms persist despite rescue inhaler/neb use.  Continue supplemental oxygen 3 lpm on POC with activity and 2 lpm at night with CPAP. Goal >88-90% Continue CPAP nightly, minimum of 4-6 hours a night Continue Guaifenesin [phone] mg Twice daily for cough/congestion     Follow up in 6 weeks after CXR with Dr. Wynona Neat (1sT) or Katie Bob Daversa,NP. If symptoms do not improve or worsen, please contact office for sooner follow up or seek emergency care.    Centrilobular emphysema (HCC) No formal obstruction on PFTs. She does have a moderate diffusion defect, which is likely more reflective of her underlying lung disease. She receives benefit from inhaler therapy. Will provide her with Breztri samples. New rx sent. Educated to contact her pharmacy for refills in the future. Verbalized understanding. Side effect profile again reviewed. Action plan in place. Smoking cessation strongly advised.   Chronic respiratory failure with hypoxia (HCC) Stable without  increased O2 requirement. Goal >88-90%   OSA on CPAP OSA/OHS on CPAP. Download reveals excellent compliance and control. She is having some large leaks but not impacting residual AHI. Cautioned on safe driving practices. Healthy weight loss encouraged.  - Continue CPAP nightly with 2 lpm supplemental O2 bled through  - Proper care/maintenance of CPAP reviewed  Class 3 obesity with alveolar hypoventilation, serious comorbidity, and body mass index (BMI) of 50.0 to 59.9 in adult See above. Restrictive lung disease. No evidence of ILD on previous imaging. Felt to be related to body habitus.   Chronic diastolic CHF (congestive heart  failure) Euvolemic today. Monitor weight and swelling. Follow up with cardiology as scheduled.   Tobacco use Desire to quit smoking. She was previously referred to lung cancer screening; however, does not qualify due to cigars not being consider in smoking history. - Provide smoking cessation support and resources    Advised if symptoms do not improve or worsen, to please contact office for sooner follow up or seek emergency care.   I spent 35 minutes of dedicated to the care of this patient on the date of this encounter to include pre-visit review of records, face-to-face time with the patient discussing conditions above, post visit ordering of testing, clinical documentation with the electronic health record, making appropriate referrals as documented, and communicating necessary findings to members of the patients care team.  Noemi Chapel, NP 02/28/2023  Pt aware and understands NP's role.

## 2023-02-28 NOTE — Assessment & Plan Note (Addendum)
See above. Restrictive lung disease. No evidence of ILD on previous imaging. Felt to be related to body habitus.

## 2023-02-28 NOTE — Assessment & Plan Note (Signed)
Euvolemic today. Monitor weight and swelling. Follow up with cardiology as scheduled.

## 2023-02-28 NOTE — Progress Notes (Signed)
 Full PFT performed today.

## 2023-02-28 NOTE — Assessment & Plan Note (Signed)
OSA/OHS on CPAP. Download reveals excellent compliance and control. She is having some large leaks but not impacting residual AHI. Cautioned on safe driving practices. Healthy weight loss encouraged.  - Continue CPAP nightly with 2 lpm supplemental O2 bled through  - Proper care/maintenance of CPAP reviewed

## 2023-02-28 NOTE — Patient Instructions (Signed)
Restart Breztri 2 puffs Twice daily. Brush tongue and rinse mouth afterwards. When you run out of the samples, you have a new prescription at your pharmacy  Continue Albuterol inhaler 2 puffs every 6 hours as needed for shortness of breath or wheezing. Notify if symptoms persist despite rescue inhaler/neb use.  Continue supplemental oxygen 3 lpm on POC with activity and 2 lpm at night with CPAP. Goal >88-90% Continue CPAP nightly, minimum of 4-6 hours a night Continue Guaifenesin [phone] mg Twice daily for cough/congestion     Follow up in 6 weeks after CXR with Dr. Wynona Neat (1sT) or Katie Alexyss Balzarini,NP. If symptoms do not improve or worsen, please contact office for sooner follow up or seek emergency care.

## 2023-02-28 NOTE — Assessment & Plan Note (Addendum)
Desire to quit smoking. She was previously referred to lung cancer screening; however, does not qualify due to cigars not being consider in smoking history. - Provide smoking cessation support and resources

## 2023-03-07 ENCOUNTER — Other Ambulatory Visit: Payer: Self-pay | Admitting: Family

## 2023-03-07 DIAGNOSIS — Z1231 Encounter for screening mammogram for malignant neoplasm of breast: Secondary | ICD-10-CM

## 2023-03-24 ENCOUNTER — Encounter: Payer: Self-pay | Admitting: Podiatry

## 2023-03-24 ENCOUNTER — Ambulatory Visit (INDEPENDENT_AMBULATORY_CARE_PROVIDER_SITE_OTHER): Payer: Medicaid Other | Admitting: Podiatry

## 2023-03-24 DIAGNOSIS — M79675 Pain in left toe(s): Secondary | ICD-10-CM

## 2023-03-24 DIAGNOSIS — E1169 Type 2 diabetes mellitus with other specified complication: Secondary | ICD-10-CM | POA: Diagnosis not present

## 2023-03-24 DIAGNOSIS — B351 Tinea unguium: Secondary | ICD-10-CM | POA: Diagnosis not present

## 2023-03-24 DIAGNOSIS — M79674 Pain in right toe(s): Secondary | ICD-10-CM | POA: Diagnosis not present

## 2023-03-24 DIAGNOSIS — E785 Hyperlipidemia, unspecified: Secondary | ICD-10-CM

## 2023-03-24 NOTE — Progress Notes (Signed)
 This patient presents to the office with chief complaint of long thick nails     This patient says there are long thick painful nails.  These nails are painful walking and wearing shoes.   Patient is unable to  self treat her own nails . Patient has been diagnosed with diabetes and claudication. This patient presents  to the office today for treatment of the  long nails.  General Appearance  Alert, conversant and in no acute stress.  Vascular  Dorsalis pedis and posterior tibial  pulses are weakly  palpable  bilaterally.  Capillary return is within normal limits  bilaterally. Temperature is within normal limits  bilaterally.  Neurologic  Senn-Weinstein monofilament wire test within normal limits/diminished  B/L.  Muscle power within normal limits bilaterally.  Nails Thick disfigured discolored nails with subungual debris  from hallux to fifth toes bilaterally. No evidence of bacterial infection or drainage bilaterally.  Orthopedic  No limitations of motion of motion feet .  No crepitus or effusions noted.  No bony pathology or digital deformities noted.  Skin  Dry skin with no porokeratosis noted bilaterally.  No signs of infections or ulcers noted.     Onychomycosis    Debride nails  with nail nipper  10.    RTC 4   months.     Helane Gunther DPM

## 2023-03-30 ENCOUNTER — Other Ambulatory Visit: Payer: Self-pay | Admitting: Neurology

## 2023-04-08 ENCOUNTER — Encounter (HOSPITAL_COMMUNITY): Payer: Self-pay

## 2023-04-08 ENCOUNTER — Observation Stay (HOSPITAL_COMMUNITY)
Admission: EM | Admit: 2023-04-08 | Discharge: 2023-04-10 | Disposition: A | Discharge status: Home Health | Attending: Emergency Medicine | Admitting: Emergency Medicine

## 2023-04-08 ENCOUNTER — Other Ambulatory Visit: Payer: Self-pay

## 2023-04-08 ENCOUNTER — Emergency Department (HOSPITAL_COMMUNITY)

## 2023-04-08 DIAGNOSIS — R059 Cough, unspecified: Secondary | ICD-10-CM | POA: Insufficient documentation

## 2023-04-08 DIAGNOSIS — E1142 Type 2 diabetes mellitus with diabetic polyneuropathy: Secondary | ICD-10-CM | POA: Insufficient documentation

## 2023-04-08 DIAGNOSIS — G4733 Obstructive sleep apnea (adult) (pediatric): Secondary | ICD-10-CM | POA: Insufficient documentation

## 2023-04-08 DIAGNOSIS — J441 Chronic obstructive pulmonary disease with (acute) exacerbation: Secondary | ICD-10-CM | POA: Diagnosis not present

## 2023-04-08 DIAGNOSIS — Z8673 Personal history of transient ischemic attack (TIA), and cerebral infarction without residual deficits: Secondary | ICD-10-CM | POA: Insufficient documentation

## 2023-04-08 DIAGNOSIS — E785 Hyperlipidemia, unspecified: Secondary | ICD-10-CM | POA: Insufficient documentation

## 2023-04-08 DIAGNOSIS — I5032 Chronic diastolic (congestive) heart failure: Secondary | ICD-10-CM | POA: Diagnosis not present

## 2023-04-08 DIAGNOSIS — F17210 Nicotine dependence, cigarettes, uncomplicated: Secondary | ICD-10-CM | POA: Insufficient documentation

## 2023-04-08 DIAGNOSIS — Z7984 Long term (current) use of oral hypoglycemic drugs: Secondary | ICD-10-CM | POA: Diagnosis not present

## 2023-04-08 DIAGNOSIS — I110 Hypertensive heart disease with heart failure: Secondary | ICD-10-CM | POA: Diagnosis not present

## 2023-04-08 DIAGNOSIS — Z79899 Other long term (current) drug therapy: Secondary | ICD-10-CM | POA: Insufficient documentation

## 2023-04-08 DIAGNOSIS — R0602 Shortness of breath: Secondary | ICD-10-CM | POA: Diagnosis present

## 2023-04-08 DIAGNOSIS — J449 Chronic obstructive pulmonary disease, unspecified: Secondary | ICD-10-CM | POA: Diagnosis present

## 2023-04-08 LAB — BASIC METABOLIC PANEL
Anion gap: 10 (ref 5–15)
BUN: 10 mg/dL (ref 6–20)
CO2: 26 mmol/L (ref 22–32)
Calcium: 9.3 mg/dL (ref 8.9–10.3)
Chloride: 103 mmol/L (ref 98–111)
Creatinine, Ser: 0.72 mg/dL (ref 0.44–1.00)
GFR, Estimated: >60 mL/min (ref >60)
Glucose, Bld: 123 mg/dL — ABNORMAL HIGH (ref 70–99)
Potassium: 3.3 mmol/L — ABNORMAL LOW (ref 3.5–5.1)
Sodium: 139 mmol/L (ref 135–145)

## 2023-04-08 LAB — GLUCOSE, CAPILLARY
Glucose-Capillary: 132 mg/dL — ABNORMAL HIGH (ref 70–99)
Glucose-Capillary: 157 mg/dL — ABNORMAL HIGH (ref 70–99)

## 2023-04-08 LAB — RESP PANEL BY RT-PCR (RSV, FLU A&B, COVID)  RVPGX2
Influenza A by PCR: NEGATIVE
Influenza B by PCR: NEGATIVE
Resp Syncytial Virus by PCR: NEGATIVE
SARS Coronavirus 2 by RT PCR: NEGATIVE

## 2023-04-08 LAB — CBC WITH DIFFERENTIAL/PLATELET
Abs Immature Granulocytes: 0.04 10*3/uL (ref 0.00–0.07)
Basophils Absolute: 0 10*3/uL (ref 0.0–0.1)
Basophils Relative: 0 %
Eosinophils Absolute: 0.1 10*3/uL (ref 0.0–0.5)
Eosinophils Relative: 1 %
HCT: 48.2 % — ABNORMAL HIGH (ref 36.0–46.0)
Hemoglobin: 15.3 g/dL — ABNORMAL HIGH (ref 12.0–15.0)
Immature Granulocytes: 0 %
Lymphocytes Relative: 40 %
Lymphs Abs: 4.6 10*3/uL — ABNORMAL HIGH (ref 0.7–4.0)
MCH: 28.3 pg (ref 26.0–34.0)
MCHC: 31.7 g/dL (ref 30.0–36.0)
MCV: 89.3 fL (ref 80.0–100.0)
Monocytes Absolute: 0.7 10*3/uL (ref 0.1–1.0)
Monocytes Relative: 7 %
Neutro Abs: 5.9 10*3/uL (ref 1.7–7.7)
Neutrophils Relative %: 52 %
Platelets: 353 10*3/uL (ref 150–400)
RBC: 5.4 MIL/uL — ABNORMAL HIGH (ref 3.87–5.11)
RDW: 16 % — ABNORMAL HIGH (ref 11.5–15.5)
WBC: 11.4 10*3/uL — ABNORMAL HIGH (ref 4.0–10.5)
nRBC: 0 % (ref 0.0–0.2)

## 2023-04-08 MED ORDER — IPRATROPIUM-ALBUTEROL 0.5-2.5 (3) MG/3ML IN SOLN
3.0000 mL | Freq: Four times a day (QID) | RESPIRATORY_TRACT | Status: DC
  Administered 2023-04-08 – 2023-04-10 (×5): 3 mL via RESPIRATORY_TRACT
  Filled 2023-04-08 (×6): qty 3

## 2023-04-08 MED ORDER — GABAPENTIN 300 MG PO CAPS
600.0000 mg | ORAL_CAPSULE | Freq: Three times a day (TID) | ORAL | Status: DC
  Administered 2023-04-08 – 2023-04-10 (×6): 600 mg via ORAL
  Filled 2023-04-08 (×6): qty 2

## 2023-04-08 MED ORDER — ALBUTEROL SULFATE (2.5 MG/3ML) 0.083% IN NEBU: 2.5000 mg | INHALATION_SOLUTION | RESPIRATORY_TRACT | Status: DC | PRN

## 2023-04-08 MED ORDER — IPRATROPIUM-ALBUTEROL 0.5-2.5 (3) MG/3ML IN SOLN
3.0000 mL | Freq: Once | RESPIRATORY_TRACT | Status: AC
  Administered 2023-04-08: 3 mL via RESPIRATORY_TRACT
  Filled 2023-04-08: qty 3

## 2023-04-08 MED ORDER — FUROSEMIDE 40 MG PO TABS
80.0000 mg | ORAL_TABLET | Freq: Every day | ORAL | Status: DC
  Administered 2023-04-09 – 2023-04-10 (×2): 80 mg via ORAL
  Filled 2023-04-08 (×2): qty 2

## 2023-04-08 MED ORDER — ATORVASTATIN CALCIUM 10 MG PO TABS
20.0000 mg | ORAL_TABLET | Freq: Every day | ORAL | Status: DC
  Administered 2023-04-09 – 2023-04-10 (×2): 20 mg via ORAL
  Filled 2023-04-08 (×2): qty 2

## 2023-04-08 MED ORDER — AZITHROMYCIN 250 MG PO TABS: 250.0000 mg | ORAL_TABLET | Freq: Every day | ORAL | Status: DC

## 2023-04-08 MED ORDER — PREDNISONE 20 MG PO TABS
40.0000 mg | ORAL_TABLET | Freq: Every day | ORAL | Status: DC
  Administered 2023-04-09 – 2023-04-10 (×2): 40 mg via ORAL
  Filled 2023-04-08 (×2): qty 2

## 2023-04-08 MED ORDER — POTASSIUM CHLORIDE CRYS ER 20 MEQ PO TBCR
20.0000 meq | EXTENDED_RELEASE_TABLET | Freq: Every day | ORAL | Status: DC
  Administered 2023-04-08 – 2023-04-10 (×3): 20 meq via ORAL
  Filled 2023-04-08 (×3): qty 1

## 2023-04-08 MED ORDER — INSULIN ASPART 100 UNIT/ML IJ SOLN: 0.0000 [IU] | Freq: Every day | INTRAMUSCULAR | Status: DC

## 2023-04-08 MED ORDER — ACETAMINOPHEN 325 MG PO TABS: 650.0000 mg | ORAL_TABLET | Freq: Four times a day (QID) | ORAL | Status: DC | PRN

## 2023-04-08 MED ORDER — INSULIN ASPART 100 UNIT/ML IJ SOLN
0.0000 [IU] | Freq: Three times a day (TID) | INTRAMUSCULAR | Status: DC
  Administered 2023-04-09: 2 [IU] via SUBCUTANEOUS
  Administered 2023-04-09: 3 [IU] via SUBCUTANEOUS

## 2023-04-08 MED ORDER — ENOXAPARIN SODIUM 40 MG/0.4ML IJ SOSY
40.0000 mg | PREFILLED_SYRINGE | INTRAMUSCULAR | Status: DC
  Administered 2023-04-08 – 2023-04-09 (×2): 40 mg via SUBCUTANEOUS
  Filled 2023-04-08 (×2): qty 0.4

## 2023-04-08 MED ORDER — DOXYCYCLINE HYCLATE 100 MG PO TABS
100.0000 mg | ORAL_TABLET | Freq: Two times a day (BID) | ORAL | Status: DC
  Administered 2023-04-09 – 2023-04-10 (×3): 100 mg via ORAL
  Filled 2023-04-08 (×3): qty 1

## 2023-04-08 MED ORDER — ACETAMINOPHEN 650 MG RE SUPP: 650.0000 mg | Freq: Four times a day (QID) | RECTAL | Status: DC | PRN

## 2023-04-08 MED ORDER — AZITHROMYCIN 250 MG PO TABS
500.0000 mg | ORAL_TABLET | Freq: Once | ORAL | Status: AC
  Administered 2023-04-08: 500 mg via ORAL
  Filled 2023-04-08: qty 2

## 2023-04-08 MED ORDER — METHYLPREDNISOLONE SODIUM SUCC 125 MG IJ SOLR
125.0000 mg | Freq: Once | INTRAMUSCULAR | Status: AC
  Administered 2023-04-08: 125 mg via INTRAVENOUS
  Filled 2023-04-08: qty 2

## 2023-04-08 MED ORDER — POLYETHYLENE GLYCOL 3350 17 G PO PACK: 17.0000 g | PACK | Freq: Every day | ORAL | Status: DC | PRN

## 2023-04-08 MED ORDER — LOSARTAN POTASSIUM 25 MG PO TABS
25.0000 mg | ORAL_TABLET | Freq: Every day | ORAL | Status: DC
  Administered 2023-04-09 – 2023-04-10 (×2): 25 mg via ORAL
  Filled 2023-04-08 (×2): qty 1

## 2023-04-08 NOTE — H&P (Signed)
 History and Physical  Denise [name] ZOX:[id number] DOB: 08/29/1970 DOA: 04/08/2023  PCP: Donato Schultz, FNP   Chief Complaint: Cough, shortness of breath  HPI: Denise [name] is a 53 y.o. female with medical history significant for obesity, diabetes, chronic heart failure with preserved EF, COPD chronically on 2 L nasal cannula oxygen being admitted to the hospital with acute exacerbation of COPD.  Patient states that for the last month or so, she has had increasing congestion and cough, with shortness of breath especially with exertion.  She has some bilateral lateral chest tightness, but denies any pleuritic chest pain.  Tells me she does not know she has had any fevers.  Denies any sick contacts.  No sputum production.  She wears a CPAP at night, as well as 2 L nasal cannula oxygen around-the-clock and has not had to increase this.  Had significant wheezing and rhonchi in the emergency department, after breathing treatments she states that she feels much better.  However with ambulation in the ER, she got very dyspneic, tachycardic, and felt dizzy.  Therefore, observation admission to the hospitalist service was requested.  Review of Systems: Please see HPI for pertinent positives and negatives. A complete 10 system review of systems are otherwise negative.  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   Chronic diastolic CHF (congestive heart failure) 07/29/2021   Class 3 obesity with alveolar hypoventilation, serious comorbidity, and body mass index (BMI) of 50.0 to 59.9 in adult 06/12/2020   CVA (cerebrovascular accident) 2017   2017 MRI - chronic left lenticulostriate territory hemorrhage or hemorrhagic infarct, with hemosiderin, and encephalomalacia affecting both the  caudate and lentiform nucleus     Decreased mobility 06/15/2021   DOE (dyspnea on exertion) 06/12/2020   Essential hypertension 12/18/2014   Fibroid uterus 12/18/2014   Heart murmur, systolic 06/29/2021   History of alcohol abuse 02/16/2017   History of cocaine use 02/16/2017   History of latent syphilis 02/16/2017   Hyperlipidemia associated with type 2 diabetes mellitus 11/07/2020   Incontinence of urine    Lacunar infarction    MRI - chronic lacunar infarct within the posterior right lentiform nucleus.     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/2017   PTSD (post-traumatic stress disorder)    Pulmonary emphysema 08/31/2021   QT prolongation 06/29/2021   Right sided weakness    Social anxiety disorder 11/03/2017   Syncope 02/07/2015   TIA (transient ischemic attack)    Tobacco use 04/14/2015   Type 2 diabetes mellitus 10/21/2017   Venous stasis dermatitis of both lower extremities 06/15/2021   Past Surgical History:  Procedure Laterality Date   CESAREAN SECTION     x 2   Social History:  reports that she has been smoking cigarettes. She has never used smokeless tobacco. She reports that she does not currently use drugs after having used the following drugs: "Crack" cocaine. No history on file for alcohol use.  Allergies  Allergen Reactions   Gadolinium Derivatives Nausea And Vomiting and Other (See Comments)    Nausea and vomiting was followed by epileptic seizure episode that lasted approximately 5 minutes; pt was unable to verbally communicate during that time; she then came to and was able to speak; was evaluated by Rad and RN; kms    Grapefruit Flavor  [Flavoring Agent (Non-Screening)] Other (See Comments)    Drug interaction    Family History  Problem Relation Age of Onset   Stroke Mother    Anxiety disorder Mother    Drug abuse Mother    Alcohol abuse Mother    Depression Mother    Drug abuse Father    Anxiety disorder Father    Heart disease Maternal Grandmother    Colon cancer Neg Hx    Esophageal cancer Neg Hx    Pancreatic cancer Neg Hx    Stomach cancer Neg Hx    Liver disease Neg Hx    Breast cancer Neg Hx      Prior to Admission medications   Medication Sig Start Date End Date Taking? Authorizing Provider  atorvastatin (LIPITOR) 20 MG tablet TAKE 1 TABLET BY MOUTH EVERY DAY 07/13/22   Chandrasekhar, Mahesh A, MD  BAYER BACK & BODY PAIN EX ST 500-32.5 MG TABS Take 1-2 tablets by mouth every 6 (six) hours as needed (for pain or discomfort).    [provider]  Budeson-Glycopyrrol-Formoterol (BREZTRI AEROSPHERE) 160-9-4.8 MCG/ACT AERO Inhale 2 puffs into the lungs in the morning and at bedtime. 02/28/23   Cobb, Ruby Cola, NP  Budeson-Glycopyrrol-Formoterol (BREZTRI AEROSPHERE) 160-9-4.8 MCG/ACT AERO Inhale 2 puffs into the lungs in the morning and at bedtime. 02/28/23   Cobb, Ruby Cola, NP  carbamazepine (TEGRETOL) 200 MG tablet Take 1 tablet (200 mg total) by mouth 2 (two) times daily. 01/03/23   Jaffe, Adam R, DO  COLD & FLU 5-10-200-325 MG CAPS Take 1-2 capsules by mouth at bedtime.    [provider]  dextromethorphan-guaiFENesin (MUCINEX DM) 30-600 MG 12hr tablet Take 1 tablet by mouth 2 (two) times daily. 02/20/23   Kathlen Mody, MD  DM-Phenylephrine-Acetaminophen 10-5-325 MG/15ML LIQD Take 15 mLs by mouth every 6 (six) hours as needed (for flu-like symptoms).    [provider]  empagliflozin (JARDIANCE) 10 MG TABS tablet Take 1 tablet (10 mg total) by mouth daily. 08/01/21   Burnadette Pop, MD  furosemide (LASIX) 80 MG tablet TAKE 1 TABLET BY MOUTH EVERY DAY Patient taking differently: Take 80  mg by mouth in the morning. 12/30/22   Chandrasekhar, [place] Dragon A, MD  gabapentin (NEURONTIN) 300 MG capsule TAKE 3 CAPSULES BY MOUTH 3 TIMES DAILY Patient taking differently: Take 900 mg by mouth in the morning, at noon, and at bedtime. 01/12/23   Drema Dallas, DO  losartan (COZAAR) 25 MG tablet Take 1 tablet (25 mg total) by mouth daily. 08/02/21   Burnadette Pop, MD  metFORMIN (GLUCOPHAGE) 500 MG tablet Take 500 mg by mouth 2 (two) times daily. 05/25/17   [provider]  nicotine (NICODERM CQ - DOSED IN MG/24 HOURS) 14 mg/24hr patch Place 1 patch (14 mg total) onto the skin daily. Patient not taking: Reported on 02/28/2023 08/02/21   Burnadette Pop, MD  Polyethylene Glycol 3350 (PEG 3350) 17 GM/SCOOP POWD Take 17 g by mouth daily as needed (constipation). 11/17/20   [provider]  potassium chloride SA (KLOR-CON M) 20 MEQ tablet Take 1 tablet (20 mEq total) by mouth daily. 08/01/21  Burnadette Pop, MD  senna-docusate (SENOKOT-S) 8.6-50 MG tablet Take 1 tablet by mouth at bedtime as needed for mild constipation. 02/20/23   Kathlen Mody, MD  Spacer/Aero-Holding Chambers (AEROCHAMBER MV) inhaler Use as instructed 09/21/21   Coralyn Helling, MD  VENTOLIN HFA 108 (90 Base) MCG/ACT inhaler inhale 2 PUFFS into THE lungs EVERY 6 HOURS AS NEEDED FOR WHEEZING OR SHORTNESS OF BREATH Patient taking differently: Inhale 2 puffs into the lungs in the morning and at bedtime. 02/15/22   Coralyn Helling, MD    Physical Exam: BP 134/68   Pulse 78   Temp 98.7 F (37.1 C)   Resp 20   Ht 5\' 5"  (1.651 m)   Wt (!) 141.1 kg   LMP 05/29/2017 (Approximate)   SpO2 95%   BMI 51.75 kg/m  General:  Alert, oriented, calm, in no acute distress, wearing 2 L nasal cannula oxygen Cardiovascular: RRR, no murmurs or rubs, no peripheral edema  Respiratory: Breath sounds are distant, there is some diffuse rhonchi, no active wheezing.  No tachypnea.  Intermittent dry cough. Abdomen: soft, nontender, nondistended, normal  bowel tones heard  Skin: dry, no rashes  Musculoskeletal: no joint effusions, normal range of motion  Psychiatric: appropriate affect, normal speech  Neurologic: extraocular muscles intact, clear speech, moving all extremities with intact sensorium         Labs on Admission:  Basic Metabolic Panel: Recent Labs  Lab 04/08/23 0755  NA 139  K 3.3*  CL 103  CO2 26  GLUCOSE 123*  BUN 10  CREATININE 0.72  CALCIUM 9.3   Liver Function Tests: No results for input(s): "AST", "ALT", "ALKPHOS", "BILITOT", "PROT", "ALBUMIN" in the last 168 hours. No results for input(s): "LIPASE", "AMYLASE" in the last 168 hours. No results for input(s): "AMMONIA" in the last 168 hours. CBC: Recent Labs  Lab 04/08/23 0755  WBC 11.4*  NEUTROABS 5.9  HGB 15.3*  HCT 48.2*  MCV 89.3  PLT 353   Cardiac Enzymes: No results for input(s): "CKTOTAL", "CKMB", "CKMBINDEX", "TROPONINI" in the last 168 hours. BNP (last 3 results) Recent Labs    10/24/22 0859 02/17/23 1649  BNP 19.8 37.3    ProBNP (last 3 results) No results for input(s): "PROBNP" in the last 8760 hours.  CBG: No results for input(s): "GLUCAP" in the last 168 hours.  Radiological Exams on Admission: DG Chest 1 View Result Date: 04/08/2023 CLINICAL DATA:  Cough, wheezing. EXAM: CHEST  1 VIEW COMPARISON:  February 17, 2023. FINDINGS: Stable cardiomediastinal silhouette. Both lungs are clear. The visualized skeletal structures are unremarkable. IMPRESSION: No active disease. Electronically Signed   By: Lupita Raider M.D.   On: 04/08/2023 09:06   Assessment/Plan Lavenia [name] is a 53 y.o. female with medical history significant for obesity, diabetes, chronic heart failure with preserved EF, COPD chronically on 2 L nasal cannula oxygen being admitted to the hospital with acute exacerbation of COPD.  Acute exacerbation of COPD-with increased cough, diffuse rhonchi.  No evidence of acute bacterial infection, she is feeling much  better after usual treatment in the ER. -Observation admission -Continue supplemental oxygen -Scheduled DuoNebs, with as needed albuterol -Received IV Solu-Medrol in the ER, will continue with prednisone 40 mg p.o. daily from tomorrow morning -Incentive spirometer and flutter valve -Empiric doxycycline (received azithromycin but has prolonged QT)  OSA-CPAP at night  Hypertension-losartan  Failure with preserved EF-patient appears to be euvolemic, continue daily Lasix with potassium supplementation  Hyperlipidemia-Lipitor  Peripheral neuropathy-gabapentin 900 tid, will monitor for  respiratory depression or other complication since this is a pretty high dose  DVT prophylaxis: Lovenox     Code Status: Full Code  Consults called: None  Admission status: Observation  Time spent: 48 minutes  Jayvan Mcshan Sharlette Dense MD [facility] Pager [phone]  If 7PM-7AM, please contact night-coverage [email or web address] Password [facility]  04/08/2023, 2:51 PM

## 2023-04-08 NOTE — Plan of Care (Signed)

## 2023-04-08 NOTE — ED Triage Notes (Signed)
 Patient brought in by EMS from home, has history of COPD wears 2L O2 at home, lives with smokers. Patient received neb treatment on the way for wheezing. Rhonchi noted in R side.

## 2023-04-08 NOTE — Progress Notes (Signed)
   04/08/23 1923  BiPAP/CPAP/SIPAP  $ Face Mask Medium Yes  BiPAP/CPAP/SIPAP Pt Type Adult  BiPAP/CPAP/SIPAP DREAMSTATIOND  Mask Type Full face mask  Dentures removed? Not applicable  Mask Size Medium  Flow Rate 3 lpm  Patient Home Equipment No  Auto Titrate Yes (5-20)  BiPAP/CPAP /SiPAP Vitals  Pulse Rate 81  Resp 19  SpO2 99 %  Bilateral Breath Sounds Diminished;Expiratory wheezes  MEWS Score/Color  MEWS Score 0  MEWS Score Color [place] Si

## 2023-04-08 NOTE — ED Notes (Signed)
 Ambulated patient. 94spo2, pulse 108. Patient stated she felt dizzy and started coughing.

## 2023-04-08 NOTE — ED Provider Notes (Signed)
 [place] EMERGENCY DEPARTMENT AT [facility] Provider Note   CSN: [id number] Arrival date & time: 04/08/23  4098     History  Chief Complaint  Patient presents with   Shortness of Breath    Denise [name] is a 53 y.o. female.  This is a 53 year old female here today for shortness of breath.  Patient states symptoms have been ongoing for 1 month.  She has had cough and congestion.  She is been taking over-the-counter medications at home.  She has a history of COPD, on 2 L at baseline.        Home Medications Prior to Admission medications   Medication Sig Start Date End Date Taking? Authorizing Provider  atorvastatin (LIPITOR) 20 MG tablet TAKE 1 TABLET BY MOUTH EVERY DAY 07/13/22   Chandrasekhar, Mahesh A, MD  BAYER BACK & BODY PAIN EX ST 500-32.5 MG TABS Take 1-2 tablets by mouth every 6 (six) hours as needed (for pain or discomfort).    [provider]  Budeson-Glycopyrrol-Formoterol (BREZTRI AEROSPHERE) 160-9-4.8 MCG/ACT AERO Inhale 2 puffs into the lungs in the morning and at bedtime. 02/28/23   Cobb, Ruby Cola, NP  Budeson-Glycopyrrol-Formoterol (BREZTRI AEROSPHERE) 160-9-4.8 MCG/ACT AERO Inhale 2 puffs into the lungs in the morning and at bedtime. 02/28/23   Cobb, Ruby Cola, NP  carbamazepine (TEGRETOL) 200 MG tablet Take 1 tablet (200 mg total) by mouth 2 (two) times daily. 01/03/23   Jaffe, Adam R, DO  COLD & FLU 5-10-200-325 MG CAPS Take 1-2 capsules by mouth at bedtime.    [provider]  dextromethorphan-guaiFENesin (MUCINEX DM) 30-600 MG 12hr tablet Take 1 tablet by mouth 2 (two) times daily. 02/20/23   Kathlen Mody, MD  DM-Phenylephrine-Acetaminophen 10-5-325 MG/15ML LIQD Take 15 mLs by mouth every 6 (six) hours as needed (for flu-like symptoms).    [provider]  empagliflozin (JARDIANCE) 10 MG TABS tablet Take 1 tablet (10 mg total) by mouth daily. 08/01/21   Burnadette Pop, MD  furosemide (LASIX) 80 MG tablet TAKE 1  TABLET BY MOUTH EVERY DAY Patient taking differently: Take 80 mg by mouth in the morning. 12/30/22   Chandrasekhar, [place] Dragon A, MD  gabapentin (NEURONTIN) 300 MG capsule TAKE 3 CAPSULES BY MOUTH 3 TIMES DAILY Patient taking differently: Take 900 mg by mouth in the morning, at noon, and at bedtime. 01/12/23   Drema Dallas, DO  losartan (COZAAR) 25 MG tablet Take 1 tablet (25 mg total) by mouth daily. 08/02/21   Burnadette Pop, MD  metFORMIN (GLUCOPHAGE) 500 MG tablet Take 500 mg by mouth 2 (two) times daily. 05/25/17   [provider]  nicotine (NICODERM CQ - DOSED IN MG/24 HOURS) 14 mg/24hr patch Place 1 patch (14 mg total) onto the skin daily. Patient not taking: Reported on 02/28/2023 08/02/21   Burnadette Pop, MD  Polyethylene Glycol 3350 (PEG 3350) 17 GM/SCOOP POWD Take 17 g by mouth daily as needed (constipation). 11/17/20   [provider]  potassium chloride SA (KLOR-CON M) 20 MEQ tablet Take 1 tablet (20 mEq total) by mouth daily. 08/01/21   Burnadette Pop, MD  senna-docusate (SENOKOT-S) 8.6-50 MG tablet Take 1 tablet by mouth at bedtime as needed for mild constipation. 02/20/23   Kathlen Mody, MD  Spacer/Aero-Holding Chambers (AEROCHAMBER MV) inhaler Use as instructed 09/21/21   Coralyn Helling, MD  VENTOLIN HFA 108 (90 Base) MCG/ACT inhaler inhale 2 PUFFS into THE lungs EVERY 6 HOURS AS NEEDED FOR WHEEZING OR SHORTNESS OF BREATH Patient taking  differently: Inhale 2 puffs into the lungs in the morning and at bedtime. 02/15/22   Coralyn Helling, MD      Allergies    Gadolinium derivatives and Grapefruit flavor [flavoring agent (non-screening)]    Review of Systems   Review of Systems  Physical Exam Updated Vital Signs BP 130/66   Pulse 75   Temp 98.7 F (37.1 C) (Oral)   Resp 15   Ht 5\' 5"  (1.651 m)   Wt (!) 141.1 kg   LMP 05/29/2017 (Approximate)   SpO2 98%   BMI 51.75 kg/m  Physical Exam Vitals reviewed.  Constitutional:      Appearance: She is ill-appearing. She is  not toxic-appearing.  HENT:     Nose: No congestion.  Eyes:     Pupils: Pupils are equal, round, and reactive to light.  Cardiovascular:     Rate and Rhythm: Normal rate.     Heart sounds: No murmur heard. Pulmonary:     Comments: Diminished air entry bilaterally, diffuse wheezing. Abdominal:     General: Abdomen is flat.     Palpations: Abdomen is soft.  Musculoskeletal:     Cervical back: Normal range of motion.  Neurological:     General: No focal deficit present.     Mental Status: She is alert.     ED Results / Procedures / Treatments   Labs (all labs ordered are listed, but only abnormal results are displayed) Labs Reviewed  CBC WITH DIFFERENTIAL/PLATELET - Abnormal; Notable for the following components:      Result Value   WBC 11.4 (*)    RBC 5.40 (*)    Hemoglobin 15.3 (*)    HCT 48.2 (*)    RDW 16.0 (*)    Lymphs Abs 4.6 (*)    All other components within normal limits  BASIC METABOLIC PANEL - Abnormal; Notable for the following components:   Potassium 3.3 (*)    Glucose, Bld 123 (*)    All other components within normal limits  RESP PANEL BY RT-PCR (RSV, FLU A&B, COVID)  [id number]    EKG None  Radiology DG Chest 1 View Result Date: 04/08/2023 CLINICAL DATA:  Cough, wheezing. EXAM: CHEST  1 VIEW COMPARISON:  February 17, 2023. FINDINGS: Stable cardiomediastinal silhouette. Both lungs are clear. The visualized skeletal structures are unremarkable. IMPRESSION: No active disease. Electronically Signed   By: Lupita Raider M.D.   On: 04/08/2023 09:06    Procedures Ultrasound ED Thoracic  Date/Time: 04/08/2023 7:40 AM  Performed by: Arletha Pili, DO Authorized by: Arletha Pili, DO   Procedure details:    Indications: dyspnea     Assessment for:  Pleural effusion   Left lung pleural:  Visualized   Right lung pleural:  Visualized   Limitations:  Body habitus Findings:    A-lines noted throughout: not identified     B-lines noted throughout: not  identified   Right Lung Findings:     right lung sliding    no right lung consolidation    no right lung pleural effusion  Left Lung Findings:     left lung sliding    no left lung consolidation    no left lung pleural effusion Impression:    Impression: none   .Critical Care  Performed by: Arletha Pili, DO Authorized by: Arletha Pili, DO   Critical care provider statement:    Critical care time (minutes):  30   Critical care was necessary to treat or  prevent imminent or life-threatening deterioration of the following conditions:  Respiratory failure   Critical care was time spent personally by me on the following activities:  Development of treatment plan with patient or surrogate, discussions with consultants, evaluation of patient's response to treatment, examination of patient, ordering and review of laboratory studies, ordering and review of radiographic studies, ordering and performing treatments and interventions, pulse oximetry, re-evaluation of patient's condition and review of old charts   Care discussed with: admitting provider       Medications Ordered in ED Medications  ipratropium-albuterol (DUONEB) 0.5-2.5 (3) MG/3ML nebulizer solution 3 mL (3 mLs Nebulization Given 04/08/23 0808)  methylPREDNISolone sodium succinate (SOLU-MEDROL) 125 mg/2 mL injection 125 mg (125 mg Intravenous Given 04/08/23 0809)  azithromycin (ZITHROMAX) tablet 500 mg (500 mg Oral Given 04/08/23 0806)  ipratropium-albuterol (DUONEB) 0.5-2.5 (3) MG/3ML nebulizer solution 3 mL (3 mLs Nebulization Given 04/08/23 0933)  ipratropium-albuterol (DUONEB) 0.5-2.5 (3) MG/3ML nebulizer solution 3 mL (3 mLs Nebulization Given 04/08/23 1059)    ED Course/ Medical Decision Making/ A&P                                 Medical Decision Making 53 year old female here today for shortness of breath.  Plan- patient on patient's history and exam, believe this likely COPD exacerbation.  Patient is on her  baseline O2 at this time, does become short of breath with speaking.  Will provide breathing treatments.  Patient still smoking, has been compliant with her inhalers.  Chest x-ray for pneumonia assessment.  CBC and BMP ordered.  Reassessment 12 PM-my independent review the patient's chest x-ray shows no pneumonia.  Ambulated the patient, became tachycardic, coughing, unable to speak in complete sentences.  O2 sat dropped to 94 and then to 90% at her baseline.  Upon returning to bed, patient's O2 sat was 90%, requiring 4 L currently.  Believe the patient requires additional treatment.  Will admit to hospitalist.  Amount and/or Complexity of Data Reviewed Labs: ordered. Radiology: ordered.  Risk Prescription drug management.           Final Clinical Impression(s) / ED Diagnoses Final diagnoses:  COPD exacerbation [facility])    Rx / DC Orders ED Discharge Orders     None         Arletha Pili, DO 04/08/23 1204

## 2023-04-08 NOTE — TOC Initial Note (Signed)
 Transition of Care [facility]) - Initial/Assessment Note    Patient Details  Name: Denise [name] MRN: [id number] Date of Birth: March 30, 1970  Transition of Care [facility] CM/SW Contact:    Adrian Prows, RN Phone Number: 04/08/2023, 5:32 PM  Clinical Narrative:                 TOC for d/c planning; spoke w/ pt in room; pt says she lives at [facility] w/ her husband [name] [phone]); she plans to return at d/c; pt says family will provide transportation; she verified Insurance/PCP; she denies SDOH risks; pt says she has a walker, shower chair, and grab bars in shower; she also has cpap and home oxygen; pt says she does not have a full travel tank; pt also says she has HHPT and Aide; she does not know the name of the agency; review of chart shows [facility] was providing [facility] services; will pass on to oncoming TOC to arrange delivery of travel tank, and verify HH agency; TOC is following.  Expected Discharge Plan: Home w Home Health Services Barriers to Discharge: Continued Medical Work up   Patient Goals and CMS Choice Patient states their goals for this hospitalization and ongoing recovery are:: home CMS Medicare.gov Compare Post Acute Care list provided to:: Patient   [place] ownership interest in [facility].provided to:: Patient    Expected Discharge Plan and Services   Discharge Planning Services: CM Consult   Living arrangements for the past 2 months: Hotel/Motel                                      Prior Living Arrangements/Services Living arrangements for the past 2 months: Hotel/Motel Lives with:: Spouse Patient language and need for interpreter reviewed:: Yes Do you feel safe going back to the place where you live?: Yes      Need for Family Participation in Patient Care: Yes (Comment) Care giver support system in place?: Yes (comment) Current home services: DME (walker, cpap, home oxygen w/ [employer]) Criminal Activity/Legal  Involvement Pertinent to Current Situation/Hospitalization: No - Comment as needed  Activities of Daily Living   ADL Screening (condition at time of admission) Independently performs ADLs?: No Does the patient have a NEW difficulty with bathing/dressing/toileting/self-feeding that is expected to last >3 days?: No Does the patient have a NEW difficulty with getting in/out of bed, walking, or climbing stairs that is expected to last >3 days?: No Does the patient have a NEW difficulty with communication that is expected to last >3 days?: No Is the patient deaf or have difficulty hearing?: No Does the patient have difficulty seeing, even when wearing glasses/contacts?: No Does the patient have difficulty concentrating, remembering, or making decisions?: No  Permission Sought/Granted Permission sought to share information with : Case Manager Permission granted to share information with : Yes, Verbal Permission Granted  Share Information with NAME: Case Manager     Permission granted to share info w Relationship: [name] (spouse) [phone]     Emotional Assessment Appearance:: Appears stated age Attitude/Demeanor/Rapport: Gracious Affect (typically observed): Accepting Orientation: : Oriented to Self, Oriented to Place, Oriented to  Time, Oriented to Situation Alcohol / Substance Use: Not Applicable Psych Involvement: No (comment)  Admission diagnosis:  COPD exacerbation (HCC) [J44.1] COPD with acute exacerbation (HCC) [J44.1] Patient Active Problem List   Diagnosis Date Noted   COPD with acute exacerbation (HCC) 04/08/2023  Chronic respiratory failure with hypoxia (HCC) 02/28/2023   Bronchopneumonia 02/18/2023   Centrilobular emphysema (HCC) 02/18/2023   CAP (community acquired pneumonia) 02/18/2023   Community acquired pneumonia 02/17/2023   Incontinence of urine    Lumbar degenerative disc disease    PTSD (post-traumatic stress disorder)    Lacunar infarction     Borderline glaucoma with ocular hypertension, 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   Nuclear sclerosis of both eyes 10/20/2022   Difficulty demonstrating health literacy 08/17/2021   Chronic diastolic CHF (congestive heart failure) 07/29/2021   Back pain 07/29/2021   Heart murmur, systolic 06/29/2021   QT prolongation 06/29/2021   Decreased mobility 06/15/2021   Venous stasis dermatitis of both lower extremities 06/15/2021   Hyperlipidemia associated with type 2 diabetes mellitus 11/07/2020   DOE (dyspnea on exertion) 06/12/2020   OSA on CPAP 06/12/2020   Class 3 obesity with alveolar hypoventilation, serious comorbidity, and body mass index (BMI) of 50.0 to 59.9 in adult 06/12/2020   Low back pain 11/21/2017   Social anxiety disorder 11/03/2017   Type 2 diabetes mellitus 10/21/2017   Psychogenic nonepileptic seizure 02/17/2017   History of latent syphilis 02/16/2017   Major depressive disorder 02/16/2017   History of alcohol abuse 02/16/2017   History of cocaine use 02/16/2017   Atopic dermatitis 05/28/2016   Tobacco use 04/14/2015   Right sided weakness    CVA (cerebrovascular accident) 2017   Pelvic pain in female 12/18/2014   Fibroid uterus 12/18/2014   Essential hypertension 12/18/2014   PCP:  Donato Schultz, FNP Pharmacy:   [facility] [place], [place] - [address] [place] [address] Phone: [phone] Fax: [phone]     Social Drivers of Health (SDOH) Social History: SDOH Screenings   Food Insecurity: No Food Insecurity (04/08/2023)  Housing: Low Risk  (04/08/2023)  Transportation Needs: No Transportation Needs (04/08/2023)  Utilities: Not At Risk (04/08/2023)  Depression (PHQ2-9): High Risk (07/15/2022)  Financial Resource Strain: Not on File (05/14/2021)   Received from [place], [place]  Physical Activity: Not on File (05/14/2021)   Received from [place], [place]  Social Connections: Unknown (02/17/2023)  Stress: Not on File (05/14/2021)    Received from [place], [place]  Tobacco Use: High Risk (04/08/2023)   SDOH Interventions: Food Insecurity Interventions: Intervention Not Indicated, Inpatient TOC Housing Interventions: Intervention Not Indicated, Inpatient TOC Transportation Interventions: Intervention Not Indicated, Inpatient TOC Utilities Interventions: Intervention Not Indicated, Inpatient TOC   Readmission Risk Interventions     No data to display

## 2023-04-09 DIAGNOSIS — J441 Chronic obstructive pulmonary disease with (acute) exacerbation: Secondary | ICD-10-CM

## 2023-04-09 LAB — BASIC METABOLIC PANEL
Anion gap: 12 (ref 5–15)
BUN: 12 mg/dL (ref 6–20)
CO2: 24 mmol/L (ref 22–32)
Calcium: 9.9 mg/dL (ref 8.9–10.3)
Chloride: 104 mmol/L (ref 98–111)
Creatinine, Ser: 0.63 mg/dL (ref 0.44–1.00)
GFR, Estimated: >60 mL/min (ref >60)
Glucose, Bld: 112 mg/dL — ABNORMAL HIGH (ref 70–99)
Potassium: 4 mmol/L (ref 3.5–5.1)
Sodium: 140 mmol/L (ref 135–145)

## 2023-04-09 LAB — CBC
HCT: 47.4 % — ABNORMAL HIGH (ref 36.0–46.0)
Hemoglobin: 14.6 g/dL (ref 12.0–15.0)
MCH: 27.9 pg (ref 26.0–34.0)
MCHC: 30.8 g/dL (ref 30.0–36.0)
MCV: 90.5 fL (ref 80.0–100.0)
Platelets: 363 10*3/uL (ref 150–400)
RBC: 5.24 MIL/uL — ABNORMAL HIGH (ref 3.87–5.11)
RDW: 15.9 % — ABNORMAL HIGH (ref 11.5–15.5)
WBC: 11.9 10*3/uL — ABNORMAL HIGH (ref 4.0–10.5)
nRBC: 0 % (ref 0.0–0.2)

## 2023-04-09 LAB — GLUCOSE, CAPILLARY
Glucose-Capillary: 120 mg/dL — ABNORMAL HIGH (ref 70–99)
Glucose-Capillary: 128 mg/dL — ABNORMAL HIGH (ref 70–99)
Glucose-Capillary: 174 mg/dL — ABNORMAL HIGH (ref 70–99)
Glucose-Capillary: 147 mg/dL — ABNORMAL HIGH (ref 70–99)

## 2023-04-09 MED ORDER — MOMETASONE FURO-FORMOTEROL FUM 100-5 MCG/ACT IN AERO
2.0000 | INHALATION_SPRAY | Freq: Two times a day (BID) | RESPIRATORY_TRACT | Status: DC
  Administered 2023-04-10: 2 via RESPIRATORY_TRACT
  Filled 2023-04-09: qty 8.8

## 2023-04-09 MED ORDER — BUDESON-GLYCOPYRROL-FORMOTEROL 160-9-4.8 MCG/ACT IN AERO: 2.0000 | INHALATION_SPRAY | Freq: Two times a day (BID) | RESPIRATORY_TRACT | Status: DC

## 2023-04-09 MED ORDER — UMECLIDINIUM BROMIDE 62.5 MCG/ACT IN AEPB
1.0000 | INHALATION_SPRAY | Freq: Every day | RESPIRATORY_TRACT | Status: DC
  Administered 2023-04-10: 1 via RESPIRATORY_TRACT
  Filled 2023-04-09: qty 7

## 2023-04-09 NOTE — Plan of Care (Signed)

## 2023-04-09 NOTE — TOC Progression Note (Signed)
 Transition of Care [facility]) - Progression Note    Patient Details  Name: Denise [name] MRN: [id number] Date of Birth: Oct 06, 1970  [facility] CM/SW Contact  Adrian Prows, RN Phone Number: 04/09/2023, 4:46 PM  Clinical Narrative:    Notified Mitch at [employer] that pt needs travel tank delivered to room; he will set up delivery; also spoke w/ Adele Dan at [employer]; she says pt is active w/ agency for HHPT.   Expected Discharge Plan: Home w Home Health Services Barriers to Discharge: Continued Medical Work up  Expected Discharge Plan and Services   Discharge Planning Services: CM Consult   Living arrangements for the past 2 months: Hotel/Motel                                       Social Determinants of Health (SDOH) Interventions SDOH Screenings   Food Insecurity: No Food Insecurity (04/08/2023)  Housing: Low Risk  (04/08/2023)  Transportation Needs: No Transportation Needs (04/08/2023)  Utilities: Not At Risk (04/08/2023)  Depression (PHQ2-9): High Risk (07/15/2022)  Financial Resource Strain: Not on File (05/14/2021)   Received from [place], [place]  Physical Activity: Not on File (05/14/2021)   Received from [place], [place]  Social Connections: Unknown (02/17/2023)  Stress: Not on File (05/14/2021)   Received from [place], [place]  Tobacco Use: High Risk (04/08/2023)    Readmission Risk Interventions     No data to display

## 2023-04-09 NOTE — Progress Notes (Signed)
 Spoke with RN concerning PT stating she does not feel as thought she is urinating her normal amount. PT does have moderate expiratory wheeze and seems slightly short of breath. RN confirms PT is receiving something for fluid retention (PT does also take at home).

## 2023-04-09 NOTE — Progress Notes (Signed)
 PROGRESS NOTE    Denise [name]  FAO:[id number] DOB: 12-Jan-1971 DOA: 04/08/2023 PCP: Donato Schultz, FNP   Brief Narrative:  Denise [name] is a 53 y.o. female with medical history significant for obesity, diabetes, chronic heart failure with preserved EF, COPD chronically on 2 L nasal cannula oxygen being admitted to the hospital with acute exacerbation of COPD   Assessment & Plan:   Principal Problem:   COPD with acute exacerbation (HCC)   Acute exacerbation of COPD Acute hypoxic respiratory failure on chronic hypoxic respiratory failure OSA -Initially requiring CPAP and elevated oxygen from baseline of 2 L nasal cannula -Improving clinically, remains hypoxic with baseline -Continue nebs, steroids, incentive spirometry and flutter.  Continue doxycycline for coverage -Continue CPAP as needed/at night   Hypertension-losartan Heart failure with preserved EF-currently euvolemic, continue furosemide/potassium Hyperlipidemia-continue atorvastatin Chronic peripheral neuropathy -continue gabapentin 900 mg 3 times daily   DVT prophylaxis: enoxaparin (LOVENOX) injection 40 mg Start: 04/08/23 1600 Code Status:   Code Status: Full Code Family Communication: None present  Status is: Inpatient  Dispo: The patient is from: Home              Anticipated d/c is to: Home              Anticipated d/c date is: 24 to 48 hours              Patient currently not medically stable for discharge given ongoing hypoxia from baseline  Consultants:  None  Procedures:  None  Antimicrobials:  Doxycycline  Subjective: No acute issues or events overnight denies nausea vomiting diarrhea constipation headache fevers chills or chest pain  Objective: Vitals:   04/08/23 1919 04/08/23 1923 04/08/23 2056 04/09/23 0300  BP:   (!) 119/57 (!) 155/77  Pulse:  81 70 66  Resp:  19 18 18   Temp:   97.6 F (36.4 C) 98 F (36.7 C)  TempSrc:   Axillary Axillary  SpO2: 98% 99% 95% 97%   Weight:      Height:       No intake or output data in the 24 hours ending 04/09/23 0804 Filed Weights   04/08/23 0745  Weight: (!) 141.1 kg    Examination:  General:  Pleasantly resting in bed, No acute distress. HEENT:  Normocephalic atraumatic.  Sclerae nonicteric, noninjected.  Extraocular movements intact bilaterally. Neck:  Without mass or deformity.  Trachea is midline. Lungs: Diffuse rhonchi with end expiratory wheeze. Heart:  Regular rate and rhythm.  Without murmurs, rubs, or gallops. Abdomen:  Soft, nontender, nondistended.  Without guarding or rebound. Extremities: Without cyanosis, clubbing, edema, or obvious deformity. Skin:  Warm and dry, no erythema.  Data Reviewed: I have personally reviewed following labs and imaging studies  CBC: Recent Labs  Lab 04/08/23 0755 04/09/23 0447  WBC 11.4* 11.9*  NEUTROABS 5.9  --   HGB 15.3* 14.6  HCT 48.2* 47.4*  MCV 89.3 90.5  PLT 353 363   Basic Metabolic Panel: Recent Labs  Lab 04/08/23 0755 04/09/23 0447  NA 139 140  K 3.3* 4.0  CL 103 104  CO2 26 24  GLUCOSE 123* 112*  BUN 10 12  CREATININE 0.72 0.63  CALCIUM 9.3 9.9   GFR: Estimated Creatinine Clearance: 115 mL/min (by C-G formula based on SCr of 0.63 mg/dL). Liver Function Tests: No results for input(s): "AST", "ALT", "ALKPHOS", "BILITOT", "PROT", "ALBUMIN" in the last 168 hours. No results for input(s): "LIPASE", "AMYLASE" in the last 168 hours. No results for  input(s): "AMMONIA" in the last 168 hours. Coagulation Profile: No results for input(s): "INR", "PROTIME" in the last 168 hours. Cardiac Enzymes: No results for input(s): "CKTOTAL", "CKMB", "CKMBINDEX", "TROPONINI" in the last 168 hours. BNP (last 3 results) No results for input(s): "PROBNP" in the last 8760 hours. HbA1C: No results for input(s): "HGBA1C" in the last 72 hours. CBG: Recent Labs  Lab 04/08/23 1626 04/08/23 2052  GLUCAP 132* 157*   Lipid Profile: No results for  input(s): "CHOL", "HDL", "LDLCALC", "TRIG", "CHOLHDL", "LDLDIRECT" in the last 72 hours. Thyroid Function Tests: No results for input(s): "TSH", "T4TOTAL", "FREET4", "T3FREE", "THYROIDAB" in the last 72 hours. Anemia Panel: No results for input(s): "VITAMINB12", "FOLATE", "FERRITIN", "TIBC", "IRON", "RETICCTPCT" in the last 72 hours. Sepsis Labs: No results for input(s): "PROCALCITON", "LATICACIDVEN" in the last 168 hours.  Recent Results (from the past 240 hours)  Resp panel by RT-PCR (RSV, Flu A&B, Covid) Anterior Nasal Swab     Status: None   Collection Time: 04/08/23  7:34 AM   Specimen: Anterior Nasal Swab  Result Value Ref Range Status   SARS Coronavirus 2 by RT PCR NEGATIVE NEGATIVE Final    Comment: (NOTE) SARS-CoV-2 target nucleic acids are NOT DETECTED.  The SARS-CoV-2 RNA is generally detectable in upper respiratory specimens during the acute phase of infection. The lowest concentration of SARS-CoV-2 viral copies this assay can detect is 138 copies/mL. A negative result does not preclude SARS-Cov-2 infection and should not be used as the sole basis for treatment or other patient management decisions. A negative result may occur with  improper specimen collection/handling, submission of specimen other than nasopharyngeal swab, presence of viral mutation(s) within the areas targeted by this assay, and inadequate number of viral copies(<138 copies/mL). A negative result must be combined with clinical observations, patient history, and epidemiological information. The expected result is Negative.  Fact Sheet for Patients:  BloggerCourse.com  Fact Sheet for Healthcare Providers:  SeriousBroker.it  This test is no t yet approved or cleared by the [place] FDA and  has been authorized for detection and/or diagnosis of SARS-CoV-2 by FDA under an Emergency Use Authorization (EUA). This EUA will remain  in effect (meaning  this test can be used) for the duration of the COVID-19 declaration under Section 564(b)(1) of the Act, 21 U.S.C.section 360bbb-3(b)(1), unless the authorization is terminated  or revoked sooner.       Influenza A by PCR NEGATIVE NEGATIVE Final   Influenza B by PCR NEGATIVE NEGATIVE Final    Comment: (NOTE) The Xpert Xpress SARS-CoV-2/FLU/RSV plus assay is intended as an aid in the diagnosis of influenza from Nasopharyngeal swab specimens and should not be used as a sole basis for treatment. Nasal washings and aspirates are unacceptable for Xpert Xpress SARS-CoV-2/FLU/RSV testing.  Fact Sheet for Patients: BloggerCourse.com  Fact Sheet for Healthcare Providers: SeriousBroker.it  This test is not yet approved or cleared by the [place] FDA and has been authorized for detection and/or diagnosis of SARS-CoV-2 by FDA under an Emergency Use Authorization (EUA). This EUA will remain in effect (meaning this test can be used) for the duration of the COVID-19 declaration under Section 564(b)(1) of the Act, 21 U.S.C. section 360bbb-3(b)(1), unless the authorization is terminated or revoked.     Resp Syncytial Virus by PCR NEGATIVE NEGATIVE Final    Comment: (NOTE) Fact Sheet for Patients: BloggerCourse.com  Fact Sheet for Healthcare Providers: SeriousBroker.it  This test is not yet approved or cleared by the [place] and  has been authorized for detection and/or diagnosis of SARS-CoV-2 by FDA under an Emergency Use Authorization (EUA). This EUA will remain in effect (meaning this test can be used) for the duration of the COVID-19 declaration under Section 564(b)(1) of the Act, 21 U.S.C. section 360bbb-3(b)(1), unless the authorization is terminated or revoked.  Performed at [facility], [address], [place], [place] [address]           Radiology Studies: DG Chest 1 View Result Date: 04/08/2023 CLINICAL DATA:  Cough, wheezing. EXAM: CHEST  1 VIEW COMPARISON:  February 17, 2023. FINDINGS: Stable cardiomediastinal silhouette. Both lungs are clear. The visualized skeletal structures are unremarkable. IMPRESSION: No active disease. Electronically Signed   By: Lupita Raider M.D.   On: 04/08/2023 09:06        Scheduled Meds:  atorvastatin  20 mg Oral Daily   doxycycline  100 mg Oral Q12H   enoxaparin (LOVENOX) injection  40 mg Subcutaneous Q24H   furosemide  80 mg Oral Daily   gabapentin  600 mg Oral TID   insulin aspart  0-15 Units Subcutaneous TID WC   insulin aspart  0-5 Units Subcutaneous QHS   ipratropium-albuterol  3 mL Nebulization QID   losartan  25 mg Oral Daily   potassium chloride SA  20 mEq Oral Daily   predniSONE  40 mg Oral Q breakfast   Continuous Infusions:   LOS: 0 days    Time spent:    Azucena Fallen, DO [facility]  If 7PM-7AM, please contact night-coverage [email or web address]  04/09/2023, 8:04 AM

## 2023-04-09 NOTE — Progress Notes (Signed)
   04/09/23 2050  BiPAP/CPAP/SIPAP  BiPAP/CPAP/SIPAP Pt Type Adult  BiPAP/CPAP/SIPAP DREAMSTATIOND  Mask Type Full face mask  Mask Size Medium  Flow Rate 3 lpm  Patient Home Equipment No  Auto Titrate Yes (5-20)  CPAP/SIPAP surface wiped down Yes  BiPAP/CPAP /SiPAP Vitals  Pulse Rate 77  Resp 18  SpO2 93 %  Bilateral Breath Sounds Diminished;Expiratory wheezes  MEWS Score/Color  MEWS Score 0  MEWS Score Color Denise [name]

## 2023-04-10 DIAGNOSIS — J441 Chronic obstructive pulmonary disease with (acute) exacerbation: Secondary | ICD-10-CM | POA: Diagnosis not present

## 2023-04-10 LAB — GLUCOSE, CAPILLARY
Glucose-Capillary: 104 mg/dL — ABNORMAL HIGH (ref 70–99)
Glucose-Capillary: 118 mg/dL — ABNORMAL HIGH (ref 70–99)

## 2023-04-10 MED ORDER — ALBUTEROL SULFATE (2.5 MG/3ML) 0.083% IN NEBU: 2.5000 mg | INHALATION_SOLUTION | Freq: Four times a day (QID) | RESPIRATORY_TRACT | Status: DC

## 2023-04-10 MED ORDER — PREDNISONE 10 MG PO TABS: ORAL_TABLET | ORAL | 0 refills | Status: AC

## 2023-04-10 MED ORDER — DOXYCYCLINE HYCLATE 100 MG PO TABS: 100.0000 mg | ORAL_TABLET | Freq: Two times a day (BID) | ORAL | 0 refills | Status: DC

## 2023-04-10 MED ORDER — CARBAMAZEPINE 200 MG PO TABS
200.0000 mg | ORAL_TABLET | Freq: Two times a day (BID) | ORAL | Status: DC
Start: 2023-04-10 — End: 2023-04-10
  Administered 2023-04-10: 200 mg via ORAL
  Filled 2023-04-10: qty 1

## 2023-04-10 NOTE — Discharge Summary (Signed)
 Physician Discharge Summary  Denise [name] ZOX:[id number] DOB: 1970/02/20 DOA: 04/08/2023  PCP: Donato Schultz, FNP  Admit date: 04/08/2023 Discharge date: 04/10/2023  Admitted From: Home Disposition: Home  Recommendations for Outpatient Follow-up:  Follow up with PCP in 1-2 weeks Follow-up with pulmonology later this month as scheduled  Home Health: None Equipment/Devices: None  Discharge Condition: Stable CODE STATUS: Full Diet recommendation: Low-salt low-fat low-carb diet  Brief/Interim Summary: Denise [name] is a 53 y.o. female with medical history significant for obesity, diabetes, chronic heart failure with preserved EF, COPD chronically on 2 L nasal cannula oxygen being admitted to the hospital with acute exacerbation of COPD.  Patient admitted as above with acute hypoxic respiratory failure in setting of COPD exacerbation complicated by sleep apnea and obesity hypoventilation.  Patient improving drastically, initially requiring CPAP for respiratory support now trending back down on 2 L nasal cannula which is her baseline.  She needs no further supplemental oxygen above baseline even with exertion.  Patient's chronic comorbid conditions appear to be stable at this time, heart failure noted and patient appears euvolemic, hyperlipidemia continue statin, peripheral neuropathy patient continues on gabapentin and hypertension is well-controlled by losartan.  Recommend close follow-up with PCP and pulmonology in the next few weeks as previously scheduled.  Discharge Diagnoses:  Principal Problem:   COPD with acute exacerbation [facility])    Discharge Instructions   Allergies as of 04/10/2023       Reactions   Gadolinium Derivatives Nausea And Vomiting, Other (See Comments)   Nausea and vomiting was followed by epileptic seizure episode that lasted approximately 5 minutes; pt was unable to verbally communicate during that time; she then came to and was able to  speak; was evaluated by Rad and RN; kms    Grapefruit Flavor [flavoring Agent (non-screening)] Other (See Comments)   Drug interaction        Medication List     TAKE these medications    AeroChamber MV inhaler Use as instructed   atorvastatin 20 MG tablet Commonly known as: LIPITOR TAKE 1 TABLET BY MOUTH EVERY DAY   Bayer Back & Body Pain Ex St 500-32.5 MG Tabs Generic drug: Aspirin-Caffeine Take 1-2 tablets by mouth every 6 (six) hours as needed (for pain or discomfort).   Breztri Aerosphere 160-9-4.8 MCG/ACT Aero Generic drug: budeson-glycopyrrolate-formoterol Inhale 2 puffs into the lungs in the morning and at bedtime.   carbamazepine 200 MG tablet Commonly known as: TEGRETOL Take 1 tablet (200 mg total) by mouth 2 (two) times daily.   Cold & Flu 5-10-200-325 MG Caps Generic drug: Phenylephrine-DM-GG-APAP Take 1-2 capsules by mouth daily as needed (for flu symptoms).   dextromethorphan-guaiFENesin 30-600 MG 12hr tablet Commonly known as: MUCINEX DM Take 1 tablet by mouth 2 (two) times daily.   DM-Phenylephrine-Acetaminophen 10-5-325 MG/15ML Liqd Take 15 mLs by mouth every 6 (six) hours as needed (for flu-like symptoms).   doxycycline 100 MG tablet Commonly known as: VIBRA-TABS Take 1 tablet (100 mg total) by mouth every 12 (twelve) hours.   furosemide 80 MG tablet Commonly known as: LASIX TAKE 1 TABLET BY MOUTH EVERY DAY What changed: when to take this   gabapentin 300 MG capsule Commonly known as: NEURONTIN TAKE 3 CAPSULES BY MOUTH 3 TIMES DAILY What changed:  how much to take how to take this when to take this additional instructions   lisinopril 30 MG tablet Commonly known as: ZESTRIL Take 30 mg by mouth daily.   metFORMIN 500 MG tablet Commonly known as:  GLUCOPHAGE Take 500 mg by mouth 2 (two) times daily.   PEG 3350 17 GM/SCOOP Powd Take 17 g by mouth daily as needed (constipation).   potassium chloride SA 20 MEQ tablet Commonly known  as: KLOR-CON M Take 1 tablet (20 mEq total) by mouth daily.   predniSONE 10 MG tablet Commonly known as: DELTASONE Take 4 tablets (40 mg total) by mouth daily for 3 days, THEN 3 tablets (30 mg total) daily for 3 days, THEN 2 tablets (20 mg total) daily for 3 days, THEN 1 tablet (10 mg total) daily for 3 days. Start taking on: April 10, 2023   senna-docusate 8.6-50 MG tablet Commonly known as: Senokot-S Take 1 tablet by mouth at bedtime as needed for mild constipation.   Ventolin HFA 108 (90 Base) MCG/ACT inhaler Generic drug: albuterol inhale 2 PUFFS into THE lungs EVERY 6 HOURS AS NEEDED FOR WHEEZING OR SHORTNESS OF BREATH What changed: reasons to take this        Follow-up Information     [facility] Follow up.   Contact information: [address] [place] [address] [phone]         [facility] Follow up.   Contact information: [address] [place] [address] [phone]                Allergies  Allergen Reactions   Gadolinium Derivatives Nausea And Vomiting and Other (See Comments)    Nausea and vomiting was followed by epileptic seizure episode that lasted approximately 5 minutes; pt was unable to verbally communicate during that time; she then came to and was able to speak; was evaluated by Rad and RN; kms    Grapefruit Flavor [Flavoring Agent (Non-Screening)] Other (See Comments)    Drug interaction    Consultations: None  Procedures/Studies: DG Chest 1 View Result Date: 04/08/2023 CLINICAL DATA:  Cough, wheezing. EXAM: CHEST  1 VIEW COMPARISON:  February 17, 2023. FINDINGS: Stable cardiomediastinal silhouette. Both lungs are clear. The visualized skeletal structures are unremarkable. IMPRESSION: No active disease. Electronically Signed   By: Lupita Raider M.D.   On: 04/08/2023 09:06     Subjective: No acute issues or events overnight   Discharge Exam: Vitals:   04/10/23 0911 04/10/23 0919   BP: (!) 157/71   Pulse: 84   Resp:    Temp:    SpO2:  95%   Vitals:   04/09/23 2050 04/10/23 0329 04/10/23 0911 04/10/23 0919  BP:  (!) 153/72 (!) 157/71   Pulse: 77 60 84   Resp: 18 20    Temp:  98 F (36.7 C)    TempSrc:  Oral    SpO2: 93% 96%  95%  Weight:      Height:        General:  Pleasantly resting in bed, No acute distress. HEENT:  Normocephalic atraumatic.  Sclerae nonicteric, noninjected.  Extraocular movements intact bilaterally. Neck:  Without mass or deformity.  Trachea is midline. Lungs:  Clear to auscultate bilaterally without rhonchi, wheeze, or rales. Heart:  Regular rate and rhythm.  Without murmurs, rubs, or gallops. Abdomen:  Soft, nontender, nondistended.  Without guarding or rebound. Extremities: Without cyanosis, clubbing, edema, or obvious deformity. Skin:  Warm and dry, no erythema.   The results of significant diagnostics from this hospitalization (including imaging, microbiology, ancillary and laboratory) are listed below for reference.     Microbiology: Recent Results (from the past 240 hours)  Resp  panel by RT-PCR (RSV, Flu A&B, Covid) Anterior Nasal Swab     Status: None   Collection Time: 04/08/23  7:34 AM   Specimen: Anterior Nasal Swab  Result Value Ref Range Status   SARS Coronavirus 2 by RT PCR NEGATIVE NEGATIVE Final    Comment: (NOTE) SARS-CoV-2 target nucleic acids are NOT DETECTED.  The SARS-CoV-2 RNA is generally detectable in upper respiratory specimens during the acute phase of infection. The lowest concentration of SARS-CoV-2 viral copies this assay can detect is 138 copies/mL. A negative result does not preclude SARS-Cov-2 infection and should not be used as the sole basis for treatment or other patient management decisions. A negative result may occur with  improper specimen collection/handling, submission of specimen other than nasopharyngeal swab, presence of viral mutation(s) within the areas targeted by this assay,  and inadequate number of viral copies(<138 copies/mL). A negative result must be combined with clinical observations, patient history, and epidemiological information. The expected result is Negative.  Fact Sheet for Patients:  BloggerCourse.com  Fact Sheet for Healthcare Providers:  SeriousBroker.it  This test is no t yet approved or cleared by the [place] FDA and  has been authorized for detection and/or diagnosis of SARS-CoV-2 by FDA under an Emergency Use Authorization (EUA). This EUA will remain  in effect (meaning this test can be used) for the duration of the COVID-19 declaration under Section 564(b)(1) of the Act, 21 U.S.C.section 360bbb-3(b)(1), unless the authorization is terminated  or revoked sooner.       Influenza A by PCR NEGATIVE NEGATIVE Final   Influenza B by PCR NEGATIVE NEGATIVE Final    Comment: (NOTE) The Xpert Xpress SARS-CoV-2/FLU/RSV plus assay is intended as an aid in the diagnosis of influenza from Nasopharyngeal swab specimens and should not be used as a sole basis for treatment. Nasal washings and aspirates are unacceptable for Xpert Xpress SARS-CoV-2/FLU/RSV testing.  Fact Sheet for Patients: BloggerCourse.com  Fact Sheet for Healthcare Providers: SeriousBroker.it  This test is not yet approved or cleared by the [place] FDA and has been authorized for detection and/or diagnosis of SARS-CoV-2 by FDA under an Emergency Use Authorization (EUA). This EUA will remain in effect (meaning this test can be used) for the duration of the COVID-19 declaration under Section 564(b)(1) of the Act, 21 U.S.C. section 360bbb-3(b)(1), unless the authorization is terminated or revoked.     Resp Syncytial Virus by PCR NEGATIVE NEGATIVE Final    Comment: (NOTE) Fact Sheet for Patients: BloggerCourse.com  Fact Sheet for  Healthcare Providers: SeriousBroker.it  This test is not yet approved or cleared by the [place] FDA and has been authorized for detection and/or diagnosis of SARS-CoV-2 by FDA under an Emergency Use Authorization (EUA). This EUA will remain in effect (meaning this test can be used) for the duration of the COVID-19 declaration under Section 564(b)(1) of the Act, 21 U.S.C. section 360bbb-3(b)(1), unless the authorization is terminated or revoked.  Performed at [facility], [address], [place], [place] [address]      Labs: BNP (last 3 results) Recent Labs    10/24/22 0859 02/17/23 1649  BNP 19.8 37.3   Basic Metabolic Panel: Recent Labs  Lab 04/08/23 0755 04/09/23 0447  NA 139 140  K 3.3* 4.0  CL 103 104  CO2 26 24  GLUCOSE 123* 112*  BUN 10 12  CREATININE 0.72 0.63  CALCIUM 9.3 9.9   Liver Function Tests: No results for input(s): "AST", "ALT", "ALKPHOS", "BILITOT", "PROT", "  ALBUMIN" in the last 168 hours. No results for input(s): "LIPASE", "AMYLASE" in the last 168 hours. No results for input(s): "AMMONIA" in the last 168 hours. CBC: Recent Labs  Lab 04/08/23 0755 04/09/23 0447  WBC 11.4* 11.9*  NEUTROABS 5.9  --   HGB 15.3* 14.6  HCT 48.2* 47.4*  MCV 89.3 90.5  PLT 353 363   Cardiac Enzymes: No results for input(s): "CKTOTAL", "CKMB", "CKMBINDEX", "TROPONINI" in the last 168 hours. BNP: Invalid input(s): "POCBNP" CBG: Recent Labs  Lab 04/09/23 0809 04/09/23 1143 04/09/23 1631 04/09/23 2059 04/10/23 0802  GLUCAP 120* 128* 174* 147* 104*   D-Dimer No results for input(s): "DDIMER" in the last 72 hours. Hgb A1c No results for input(s): "HGBA1C" in the last 72 hours. Lipid Profile No results for input(s): "CHOL", "HDL", "LDLCALC", "TRIG", "CHOLHDL", "LDLDIRECT" in the last 72 hours. Thyroid function studies No results for input(s): "TSH", "T4TOTAL", "T3FREE", "THYROIDAB" in the last 72  hours.  Invalid input(s): "FREET3" Anemia work up No results for input(s): "VITAMINB12", "FOLATE", "FERRITIN", "TIBC", "IRON", "RETICCTPCT" in the last 72 hours. Urinalysis    Component Value Date/Time   COLORURINE YELLOW 10/24/2022 0927   APPEARANCEUR CLEAR 10/24/2022 0927   LABSPEC 1.011 10/24/2022 0927   PHURINE 6.0 10/24/2022 0927   GLUCOSEU NEGATIVE 10/24/2022 0927   HGBUR NEGATIVE 10/24/2022 0927   BILIRUBINUR NEGATIVE 10/24/2022 0927   KETONESUR NEGATIVE 10/24/2022 0927   PROTEINUR NEGATIVE 10/24/2022 0927   UROBILINOGEN 0.2 11/22/2014 0240   NITRITE NEGATIVE 10/24/2022 0927   LEUKOCYTESUR NEGATIVE 10/24/2022 0927   Sepsis Labs Recent Labs  Lab 04/08/23 0755 04/09/23 0447  WBC 11.4* 11.9*   Microbiology Recent Results (from the past 240 hours)  Resp panel by RT-PCR (RSV, Flu A&B, Covid) Anterior Nasal Swab     Status: None   Collection Time: 04/08/23  7:34 AM   Specimen: Anterior Nasal Swab  Result Value Ref Range Status   SARS Coronavirus 2 by RT PCR NEGATIVE NEGATIVE Final    Comment: (NOTE) SARS-CoV-2 target nucleic acids are NOT DETECTED.  The SARS-CoV-2 RNA is generally detectable in upper respiratory specimens during the acute phase of infection. The lowest concentration of SARS-CoV-2 viral copies this assay can detect is 138 copies/mL. A negative result does not preclude SARS-Cov-2 infection and should not be used as the sole basis for treatment or other patient management decisions. A negative result may occur with  improper specimen collection/handling, submission of specimen other than nasopharyngeal swab, presence of viral mutation(s) within the areas targeted by this assay, and inadequate number of viral copies(<138 copies/mL). A negative result must be combined with clinical observations, patient history, and epidemiological information. The expected result is Negative.  Fact Sheet for Patients:  BloggerCourse.com  Fact  Sheet for Healthcare Providers:  SeriousBroker.it  This test is no t yet approved or cleared by the [place] FDA and  has been authorized for detection and/or diagnosis of SARS-CoV-2 by FDA under an Emergency Use Authorization (EUA). This EUA will remain  in effect (meaning this test can be used) for the duration of the COVID-19 declaration under Section 564(b)(1) of the Act, 21 U.S.C.section 360bbb-3(b)(1), unless the authorization is terminated  or revoked sooner.       Influenza A by PCR NEGATIVE NEGATIVE Final   Influenza B by PCR NEGATIVE NEGATIVE Final    Comment: (NOTE) The Xpert Xpress SARS-CoV-2/FLU/RSV plus assay is intended as an aid in the diagnosis of influenza from Nasopharyngeal swab specimens and should not be  used as a sole basis for treatment. Nasal washings and aspirates are unacceptable for Xpert Xpress SARS-CoV-2/FLU/RSV testing.  Fact Sheet for Patients: BloggerCourse.com  Fact Sheet for Healthcare Providers: SeriousBroker.it  This test is not yet approved or cleared by the [place] FDA and has been authorized for detection and/or diagnosis of SARS-CoV-2 by FDA under an Emergency Use Authorization (EUA). This EUA will remain in effect (meaning this test can be used) for the duration of the COVID-19 declaration under Section 564(b)(1) of the Act, 21 U.S.C. section 360bbb-3(b)(1), unless the authorization is terminated or revoked.     Resp Syncytial Virus by PCR NEGATIVE NEGATIVE Final    Comment: (NOTE) Fact Sheet for Patients: BloggerCourse.com  Fact Sheet for Healthcare Providers: SeriousBroker.it  This test is not yet approved or cleared by the [place] FDA and has been authorized for detection and/or diagnosis of SARS-CoV-2 by FDA under an Emergency Use Authorization (EUA). This EUA will remain in effect  (meaning this test can be used) for the duration of the COVID-19 declaration under Section 564(b)(1) of the Act, 21 U.S.C. section 360bbb-3(b)(1), unless the authorization is terminated or revoked.  Performed at [facility], [address] [place], [place] [address]      Time coordinating discharge: Over 30 minutes  SIGNED:   Azucena Fallen, DO [facility] 04/10/2023, 9:32 AM Pager   If 7PM-7AM, please contact night-coverage [email or web address]

## 2023-04-10 NOTE — Plan of Care (Signed)
 Pt discharge to home independently. Transportation set up by case manager and oxygen tank delivered at bedside. Discharge education done at bedside. Pt verbalized understanding of medication regimen and follow up appointment. IV removed. Vitals stable.  Problem: Education: Goal: Knowledge of General Education information will improve Description: Including pain rating scale, medication(s)/side effects and non-pharmacologic comfort measures Outcome: Adequate for Discharge   Problem: Health Behavior/Discharge Planning: Goal: Ability to manage health-related needs will improve Outcome: Adequate for Discharge   Problem: Clinical Measurements: Goal: Ability to maintain clinical measurements within normal limits will improve Outcome: Adequate for Discharge Goal: Will remain free from infection Outcome: Adequate for Discharge Goal: Diagnostic test results will improve Outcome: Adequate for Discharge Goal: Respiratory complications will improve Outcome: Adequate for Discharge Goal: Cardiovascular complication will be avoided Outcome: Adequate for Discharge   Problem: Activity: Goal: Risk for activity intolerance will decrease Outcome: Adequate for Discharge   Problem: Nutrition: Goal: Adequate nutrition will be maintained Outcome: Adequate for Discharge   Problem: Coping: Goal: Level of anxiety will decrease Outcome: Adequate for Discharge   Problem: Elimination: Goal: Will not experience complications related to bowel motility Outcome: Adequate for Discharge Goal: Will not experience complications related to urinary retention Outcome: Adequate for Discharge   Problem: Pain Managment: Goal: General experience of comfort will improve and/or be controlled Outcome: Adequate for Discharge   Problem: Safety: Goal: Ability to remain free from injury will improve Outcome: Adequate for Discharge   Problem: Skin Integrity: Goal: Risk for impaired skin integrity will decrease Outcome:  Adequate for Discharge   Problem: Education: Goal: Ability to describe self-care measures that may prevent or decrease complications (Diabetes Survival Skills Education) will improve Outcome: Adequate for Discharge Goal: Individualized Educational Video(s) Outcome: Adequate for Discharge   Problem: Coping: Goal: Ability to adjust to condition or change in health will improve Outcome: Adequate for Discharge   Problem: Fluid Volume: Goal: Ability to maintain a balanced intake and output will improve Outcome: Adequate for Discharge   Problem: Health Behavior/Discharge Planning: Goal: Ability to identify and utilize available resources and services will improve Outcome: Adequate for Discharge Goal: Ability to manage health-related needs will improve Outcome: Adequate for Discharge   Problem: Metabolic: Goal: Ability to maintain appropriate glucose levels will improve Outcome: Adequate for Discharge   Problem: Nutritional: Goal: Maintenance of adequate nutrition will improve Outcome: Adequate for Discharge Goal: Progress toward achieving an optimal weight will improve Outcome: Adequate for Discharge   Problem: Skin Integrity: Goal: Risk for impaired skin integrity will decrease Outcome: Adequate for Discharge   Problem: Tissue Perfusion: Goal: Adequacy of tissue perfusion will improve Outcome: Adequate for Discharge

## 2023-04-10 NOTE — Progress Notes (Signed)
SATURATION QUALIFICATIONS: (This note is used to comply with regulatory documentation for home oxygen)  Patient Saturations on Room Air at Rest = 94%  Patient Saturations on Room Air while Ambulating = 87%  Patient Saturations on 2 Liters of oxygen while Ambulating = 92%  Please briefly explain why patient needs home oxygen: 

## 2023-04-10 NOTE — TOC Transition Note (Addendum)
 Transition of Care [facility]) - Discharge Note   Patient Details  Name: Denise [name] MRN: [id number] Date of Birth: 1970-03-29  [facility] CM/SW Contact:  Adrian Prows, RN Phone Number: 04/10/2023, 10:44 AM   Clinical Narrative:    D/C orders received; pt's travel tank has been delivered to room; notified pt needs transportation home; spoke w/ pt in room; she says she cannot contact her ride; she also says she does not have money to pay for transportation; pt says she will d/c to [facility] [address] [place] [address]; [place] Waiver and Release of Liability signed by pt and placed in shadow chart; Massielys, RN notified taxi voucher in shadow chart; she will call taxi service when pt is ready for d/c; [facility] at [facility] notified of pt's d/c; contact info for [employer] and [facility] placed in follow up provider section of d/c instructions; no TOC needs.    Final next level of care: Home w Home Health Services Barriers to Discharge: No Barriers Identified   Patient Goals and CMS Choice Patient states their goals for this hospitalization and ongoing recovery are:: home CMS Medicare.gov Compare Post Acute Care list provided to:: Patient   [place] ownership interest in [facility].provided to:: Patient    Discharge Placement                       Discharge Plan and Services Additional resources added to the After Visit Summary for     Discharge Planning Services: CM Consult                                 Social Drivers of Health (SDOH) Interventions SDOH Screenings   Food Insecurity: No Food Insecurity (04/08/2023)  Housing: Low Risk  (04/08/2023)  Transportation Needs: No Transportation Needs (04/08/2023)  Utilities: Not At Risk (04/08/2023)  Depression (PHQ2-9): High Risk (07/15/2022)  Financial Resource Strain: Not on File (05/14/2021)   Received from [place], [place]  Physical Activity: Not on File (05/14/2021)    Received from [place], [place]  Social Connections: Unknown (02/17/2023)  Stress: Not on File (05/14/2021)   Received from [place], [place]  Tobacco Use: High Risk (04/08/2023)     Readmission Risk Interventions     No data to display

## 2023-04-10 NOTE — Progress Notes (Signed)
 Patient walked with O2 @ 2L N/C. pt started with 96% sitting position and upon walking desaturated to 92%. noted with some SOB upon ambulating. Once returned to room, pt's O2 went back to 96% O2 @ 2L n/c.

## 2023-04-13 ENCOUNTER — Ambulatory Visit
Admission: RE | Admit: 2023-04-13 | Discharge: 2023-04-13 | Disposition: A | Discharge status: Home / Self Care | Payer: Medicaid Other | Source: Ambulatory Visit | Attending: Family | Admitting: Family

## 2023-04-13 DIAGNOSIS — Z1231 Encounter for screening mammogram for malignant neoplasm of breast: Secondary | ICD-10-CM

## 2023-04-20 ENCOUNTER — Encounter: Payer: Self-pay | Admitting: Pulmonary Disease

## 2023-04-20 ENCOUNTER — Ambulatory Visit: Payer: Medicaid Other | Admitting: Pulmonary Disease

## 2023-04-20 VITALS — BP 134/79 | HR 84 | Temp 97.8°F | Ht 65.0 in | Wt 314.0 lb

## 2023-04-20 DIAGNOSIS — E66813 Obesity, class 3: Secondary | ICD-10-CM

## 2023-04-20 DIAGNOSIS — G4733 Obstructive sleep apnea (adult) (pediatric): Secondary | ICD-10-CM

## 2023-04-20 DIAGNOSIS — Z6841 Body Mass Index (BMI) 40.0 and over, adult: Secondary | ICD-10-CM

## 2023-04-20 DIAGNOSIS — R5381 Other malaise: Secondary | ICD-10-CM | POA: Diagnosis not present

## 2023-04-20 DIAGNOSIS — J9610 Chronic respiratory failure, unspecified whether with hypoxia or hypercapnia: Secondary | ICD-10-CM

## 2023-04-20 DIAGNOSIS — J449 Chronic obstructive pulmonary disease, unspecified: Secondary | ICD-10-CM

## 2023-04-20 DIAGNOSIS — F17210 Nicotine dependence, cigarettes, uncomplicated: Secondary | ICD-10-CM

## 2023-04-20 DIAGNOSIS — Z9981 Dependence on supplemental oxygen: Secondary | ICD-10-CM

## 2023-04-20 DIAGNOSIS — R0602 Shortness of breath: Secondary | ICD-10-CM

## 2023-04-20 NOTE — Progress Notes (Signed)
 Denise [name]    [id number]    [birth year]  Primary Care Physician:Lucas, Floyde Parkins, FNP  Referring Physician: Inc, [facility] [address] [place],  [place] [address]  Chief complaint:   Follow-up for recent hospitalization  HPI:  Patient with a history of obstructive lung disease, obstructive sleep apnea on CPAP therapy, active smoker, obesity with obesity hypoventilation.  Chronic respiratory failure on oxygen supplementation Has a lot of skill skeletal pain and discomfort that limits activity  Uses oxygen around-the-clock at 2 L  Has been on CPAP now for about 4 years Has been on oxygen about 4 years as well  Was recently hospitalized for exacerbation of COPD Seems to be improving  Limited with activities only able to walk a few feet Has chronic hip pain which limits activities  Has a history of congestive heart failure, hemorrhagic CVA history, psychogenic seizures, PTSD, depression, anxiety, history of substance abuse in the past, some cognitive dysfunction.  Did have COVID in February 2021, history of syphilis  Smokes about half a pack a day and she is trying to cut down  Outpatient Encounter Medications as of 04/20/2023  Medication Sig   atorvastatin (LIPITOR) 20 MG tablet TAKE 1 TABLET BY MOUTH EVERY DAY   BAYER BACK & BODY PAIN EX ST 500-32.5 MG TABS Take 1-2 tablets by mouth every 6 (six) hours as needed (for pain or discomfort).   Budeson-Glycopyrrol-Formoterol (BREZTRI AEROSPHERE) 160-9-4.8 MCG/ACT AERO Inhale 2 puffs into the lungs in the morning and at bedtime.   carbamazepine (TEGRETOL) 200 MG tablet Take 1 tablet (200 mg total) by mouth 2 (two) times daily.   COLD & FLU 5-10-200-325 MG CAPS Take 1-2 capsules by mouth daily as needed (for flu symptoms).   dextromethorphan-guaiFENesin (MUCINEX DM) 30-600 MG 12hr tablet Take 1 tablet by mouth 2 (two) times daily.   DM-Phenylephrine-Acetaminophen 10-5-325 MG/15ML LIQD  Take 15 mLs by mouth every 6 (six) hours as needed (for flu-like symptoms).   doxycycline (VIBRA-TABS) 100 MG tablet Take 1 tablet (100 mg total) by mouth every 12 (twelve) hours.   furosemide (LASIX) 80 MG tablet TAKE 1 TABLET BY MOUTH EVERY DAY (Patient taking differently: Take 80 mg by mouth in the morning.)   gabapentin (NEURONTIN) 300 MG capsule TAKE 3 CAPSULES BY MOUTH 3 TIMES DAILY (Patient taking differently: Take 900 mg by mouth in the morning, at noon, and at bedtime.)   lisinopril (ZESTRIL) 30 MG tablet Take 30 mg by mouth daily.   metFORMIN (GLUCOPHAGE) 500 MG tablet Take 500 mg by mouth 2 (two) times daily.   Polyethylene Glycol 3350 (PEG 3350) 17 GM/SCOOP POWD Take 17 g by mouth daily as needed (constipation).   potassium chloride SA (KLOR-CON M) 20 MEQ tablet Take 1 tablet (20 mEq total) by mouth daily.   predniSONE (DELTASONE) 10 MG tablet Take 4 tablets (40 mg total) by mouth daily for 3 days, THEN 3 tablets (30 mg total) daily for 3 days, THEN 2 tablets (20 mg total) daily for 3 days, THEN 1 tablet (10 mg total) daily for 3 days.   senna-docusate (SENOKOT-S) 8.6-50 MG tablet Take 1 tablet by mouth at bedtime as needed for mild constipation.   Spacer/Aero-Holding Chambers (AEROCHAMBER MV) inhaler Use as instructed   VENTOLIN HFA 108 (90 Base) MCG/ACT inhaler inhale 2 PUFFS into THE lungs EVERY 6 HOURS AS NEEDED FOR WHEEZING OR SHORTNESS OF BREATH (Patient taking differently: Inhale 2 puffs into  the lungs every 6 (six) hours as needed for shortness of breath.)   No facility-administered encounter medications on file as of 04/20/2023.    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   Chronic diastolic CHF (congestive heart failure) 07/29/2021   Class 3 obesity with alveolar hypoventilation, serious comorbidity, and body mass index (BMI) of 50.0 to 59.9 in adult 06/12/2020   CVA (cerebrovascular accident) 2017   2017 MRI - chronic left lenticulostriate territory hemorrhage or hemorrhagic infarct, with hemosiderin, and encephalomalacia affecting both the caudate and lentiform nucleus     Decreased mobility 06/15/2021   DOE (dyspnea on exertion) 06/12/2020   Essential hypertension 12/18/2014   Fibroid uterus 12/18/2014   Heart murmur, systolic 06/29/2021   History of alcohol abuse 02/16/2017   History of cocaine use 02/16/2017   History of latent syphilis 02/16/2017   Hyperlipidemia associated with type 2 diabetes mellitus 11/07/2020   Incontinence of urine    Lacunar infarction    MRI - chronic lacunar infarct within the posterior right lentiform nucleus.     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/2017   PTSD (post-traumatic stress disorder)    Pulmonary emphysema 08/31/2021   QT prolongation 06/29/2021   Right sided weakness    Social anxiety disorder 11/03/2017   Syncope 02/07/2015   TIA (transient ischemic attack)    Tobacco use 04/14/2015   Type 2 diabetes mellitus 10/21/2017   Venous stasis dermatitis of both lower extremities 06/15/2021    Past Surgical History:  Procedure Laterality Date   CESAREAN SECTION     x 2    Family History  Problem Relation Age of Onset   Stroke  Mother    Anxiety disorder Mother    Drug abuse Mother    Alcohol abuse Mother    Depression Mother    Drug abuse Father    Anxiety disorder Father    Heart disease Maternal Grandmother    Colon cancer Neg Hx    Esophageal cancer Neg Hx    Pancreatic cancer Neg Hx    Stomach cancer Neg Hx    Liver disease Neg Hx    Breast cancer Neg Hx     Social History   Socioeconomic History   Marital status: Married    Spouse name: [name]   Number of children: 3   Years of education: 6   Highest education level: 6th grade  Occupational History   Occupation: Disability  Tobacco Use   Smoking status: Every Day    Current packs/day: 0.40    Types: Cigarettes   Smokeless tobacco: Never   Tobacco comments:    Smoking 1 pack of cigarettes a week. 09/21/21 Tay    Patients states she  is smoking about 8 cigarettes a day now. AB, CMA 12-08-2022   Vaping Use   Vaping status: Never Used  Substance and Sexual Activity   Alcohol use: Not on file   Drug use: Not Currently    Types: "Crack" cocaine   Sexual activity: Yes    Partners: Male    Birth control/protection: None  Other Topics Concern   Not on file  Social History Narrative   Not on file   Social Drivers of Health   Financial Resource Strain: Not on File (05/14/2021)   Received from [facility], [facility]    Financial Resource Strain: 0  Food Insecurity: No Food Insecurity (04/08/2023)   Hunger Vital Sign    Worried About Running Out of Food in the Last Year: Never true    Ran Out of Food in the Last Year: Never true  Transportation Needs: No Transportation Needs (04/08/2023)   PRAPARE - Administrator, Civil Service (Medical): No    Lack of Transportation (Non-Medical): No  Physical Activity: Not on File (05/14/2021)   Received from [place], [place]   Physical Activity    Physical Activity: 0  Stress: Not on File (05/14/2021)   Received from [facility], [place]   Stress    Stress: 0  Social  Connections: Unknown (02/17/2023)   Social Connection and Isolation Panel [NHANES]    Frequency of Communication with Friends and Family: Three times a week    Frequency of Social Gatherings with Friends and Family: More than three times a week    Attends Religious Services: Patient unable to answer    Active Member of Clubs or Organizations: Not on file    Attends Banker Meetings: Patient unable to answer    Marital Status: Married  Catering manager Violence: Not At Risk (04/08/2023)   Humiliation, Afraid, Rape, and Kick questionnaire    Fear of Current or Ex-Partner: No    Emotionally Abused: No    Physically Abused: No    Sexually Abused: No    Review of Systems  Constitutional:  Positive for fatigue.  Respiratory:  Positive for apnea and shortness of breath.   Psychiatric/Behavioral:  Positive for sleep disturbance.     Vitals:   04/20/23 0836  BP: 134/79  Pulse: 84  Temp: 97.8 F (36.6 C)  SpO2: 92%     Physical Exam Constitutional:      Appearance: She is obese.  HENT:     Head: Normocephalic.     Mouth/Throat:     Mouth: Mucous membranes are moist.  Eyes:     General: No scleral icterus. Cardiovascular:     Rate and Rhythm: Normal rate and regular rhythm.     Heart sounds: No murmur heard.    No friction rub.  Pulmonary:     Effort: No respiratory distress.     Breath sounds: No stridor. No wheezing or rhonchi.  Musculoskeletal:     Cervical back: No rigidity or tenderness.  Neurological:     Mental Status: She is alert.  Psychiatric:        Mood and Affect: Mood normal.    Data Reviewed: Recent hospital records reviewed  Most recent echocardiogram 01/18/2023 shows normal ejection fraction  PFT 02/28/2023 shows moderate restriction with moderately reduced diffusing capacity  Compliance data reviewed showing excellent compliance with CPAP at 93% CPAP of 17 Residual AHI of 3.3  Assessment:  Obstructive sleep apnea on CPAP  therapy/obesity hypoventilation -On CPAP  at night  Chronic respiratory failure -On oxygen supplementation  Class III obesity -Working on weight loss  Active smoker -Encouraged to work on quitting  Deconditioning -Multifactorial reasons for deconditioning Pain and discomfort definitely contributing  Obstructive lung disease -On Breztri  Plan/Recommendations: Encourage weight loss efforts  Smoking cessation counseling  Follow-up in about 8 weeks  Will repeat chest x-ray at next visit  Continue oxygen supplementation  Graded activities as tolerated  Continue Breztri  Because of multiple comorbidities, patient may benefit from weight loss medications like Tirzepatide -Encouraged to discuss this with her primary doctor     Virl Diamond MD [facility] 04/20/2023, 8:54 AM  CC: [facility]

## 2023-04-20 NOTE — Patient Instructions (Addendum)
 Will see you in 6 to 8 weeks  Get a chest x-ray on the day of next visit  Continue working on quitting smoking  Discussed weight loss medications with your primary doctor -I think with your lung disease, musculoskeletal limitations -Losing weight will definitely help a great deal  Continue working on your diet, regular exercises as tolerated  Continue using your CPAP nightly  Call us with significant concerns

## 2023-05-09 ENCOUNTER — Other Ambulatory Visit: Payer: Self-pay | Admitting: Neurology

## 2023-05-09 ENCOUNTER — Other Ambulatory Visit: Payer: Self-pay

## 2023-05-25 ENCOUNTER — Other Ambulatory Visit: Payer: Self-pay | Admitting: Neurology

## 2023-05-31 NOTE — Progress Notes (Unsigned)
 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   Increase carbamazepine  to 400mg  twice daily for neuralgia and headache.  Check BMP in one month Gabapentin  900mg  three times daily for neuralgia and headache Recommend treatment for anxiety and depression CPAP Follow up 6 months.   Subjective:  Dorthe Neilsen is a 53 year old right-handed woman with residual right sided weakness secondary to hemorrhagic stroke, psychogenic non-epileptic seizures, PTSD and severe recurrent depression and generalized anxiety disorder, HTN, and history of treated syphilis follows up for scalp neuralgia   UPDATE: For trigeminal neuralgia and scalp neuralgia, started carbamazepine  in addition to the gabapentin .  No significant change.  Scalp pain in episodic, lasting 20 minutes 3 times a day.  Left trigeminal neuralgia still occurs 3 times a day.  04/09/2023 LABS:  BMP with Na 140, K 4, Cl 104, CO2 24, glucose 112, BUN 12, Cr 0.63; CBC with WBC 11.9, HGB 14.6, HCT 47.4, PLT 363     Current NSAIDS:  none Current analgesics:  none Current anti-emetic:  Zofran  Current Antihypertensive medications:  Lisinopril , losartan , furosemide  Current Antidepressant/antipsychotic medications: Trintellix  Current Anticonvulsant medications:  Gabapentin  900mg  three times daily, carbamazepine  200mg  twice daily  She is using the CPAP. Still smokes but using the patch.        HISTORY:  Scalp Neuralgia and Trigeminal Neuralgia: She developed headaches following hospitalization in late August 2021 for lithium  toxicity presenting with confusion and headaches.  CT of head personally reviewed showed partially empty sella and known old left basal  ganglia infarct but no acute intracranial abnormality.  She reports that the headaches have not improved since treatment.  She gets the headaches daily, about 2 hours a day.  It feels like that her entire top of head (in cap distribution) is being stabbed with hot pokers.  She also endorses a hot pressure sticking her in her left eye.  Also a pressure on top of head.  Scalp pain in episodic, lasting 20 minutes 3 times a day.  However, she gets a paroxysmal shooting pain from the left eye up into center of head lasting 5 seconds.  She reports associated drooling off of the left side of her mouth.  Separate from the headaches, she reports that she has periodic blurred vision.  She also zones out from time to time.  She has increased difficulty speaking and slurs her speech.  She has trouble just functioning and performing physical tasks.  MRI of brain without contrast on 11/05/2019 showed nonspecific chronic scattered white matter changes with chronic left basal ganglia infarcts and remote pontine microhemorrhage   Cognitive complaints: Her husband is concerned about her memory.  She easily forgets conversations.  She has had memory deficits for some time. Sometimes has trouble getting words out.  No known family history of Alzheimer's.  Labs included negative HIV, negative RPR, and B12 245. Advised to start B12 1000mcg daily.  Repeat level was 512.  MRI of brain on 08/20/2021 personally reviewd revealed mild chronic small vessel ischemic changes within the cerebral white matter and pons with chronic infarct within the posterior right lentiform nucleus and left corona radiata and basal ganglia/internal capsule, stable compared to prior MRI from 08/05/2019.  Underwent neuropsychological evaluation on 11/10/2022 suggested that her cognitive dysfunction likely is due to  her longstanding intellectual weaknesses exacerbated by cerebrovascular disease, frequent headaches, sleep dysfunction and psychiatric history, but not  due to an underlying neurodegenerative disease.     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   Chronic diastolic CHF (congestive heart failure) 07/29/2021   Class 3 obesity with alveolar hypoventilation, serious comorbidity, and body mass index (BMI) of 50.0 to 59.9 in adult 06/12/2020   CVA (cerebrovascular accident) 2017   2017 MRI - chronic left lenticulostriate territory hemorrhage or hemorrhagic infarct, with hemosiderin, and encephalomalacia affecting both the caudate and lentiform nucleus     Decreased mobility 06/15/2021   DOE (dyspnea on exertion) 06/12/2020   Essential hypertension 12/18/2014   Fibroid uterus 12/18/2014   Heart murmur, systolic 06/29/2021   History of alcohol abuse 02/16/2017   History of cocaine use 02/16/2017   History of latent syphilis 02/16/2017   Hyperlipidemia associated with type 2 diabetes mellitus 11/07/2020   Incontinence of urine    Lacunar infarction    MRI - chronic lacunar infarct within the posterior right lentiform nucleus.     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   Low back pain 11/21/2017   Lumbar degenerative disc disease    Major depressive disorder 02/16/2017   Nuclear sclerosis of both eyes 10/20/2022   OSA on CPAP 06/12/2020   Overactive bladder    Pain due to onychomycosis of toenails of both feet 05/26/2020   Pelvic pain in female 12/18/2014   Psychogenic nonepileptic seizure 02/17/2017   PTSD (post-traumatic stress  disorder)    Pulmonary emphysema 08/31/2021   QT prolongation 06/29/2021   Right sided weakness    Social anxiety disorder 11/03/2017   Syncope 02/07/2015   TIA (transient ischemic attack)    Tobacco use 04/14/2015   Type 2 diabetes mellitus 10/21/2017   Venous stasis dermatitis of both lower extremities 06/15/2021    MEDICATIONS: Current Outpatient Medications on File Prior to Visit  Medication Sig Dispense Refill   atorvastatin  (LIPITOR) 20 MG tablet TAKE 1 TABLET BY MOUTH EVERY DAY 90 tablet 3   BAYER BACK & BODY PAIN EX ST 500-32.5 MG TABS Take 1-2 tablets by mouth every 6 (six) hours as needed (for pain or discomfort).     Budeson-Glycopyrrol-Formoterol  (BREZTRI  AEROSPHERE) 160-9-4.8 MCG/ACT AERO Inhale 2 puffs into the lungs in the morning and at bedtime. 10.7 g 11   carbamazepine  (TEGRETOL ) 200 MG tablet TAKE 1 TABLET BY MOUTH 2 TIMES DAILY 60 tablet 0   COLD & FLU 5-10-200-325 MG CAPS Take 1-2 capsules by mouth daily as needed (for flu symptoms).     dextromethorphan -guaiFENesin  (MUCINEX  DM) 30-600 MG 12hr tablet Take 1 tablet by mouth 2 (two) times daily. 60 tablet 2   DM-Phenylephrine-Acetaminophen  10-5-325 MG/15ML LIQD Take 15 mLs by mouth every 6 (six) hours as needed (for flu-like symptoms).     doxycycline  (VIBRA -TABS) 100 MG tablet Take 1 tablet (100 mg total) by mouth every 12 (twelve) hours. 4 tablet 0   furosemide  (LASIX ) 80 MG tablet TAKE 1 TABLET BY MOUTH EVERY DAY (Patient taking differently: Take 80 mg by  mouth in the morning.) 90 tablet 2   gabapentin  (NEURONTIN ) 300 MG capsule TAKE 3 CAPSULES BY MOUTH 3 TIMES DAILY 270 capsule 4   lisinopril  (ZESTRIL ) 30 MG tablet Take 30 mg by mouth daily.     metFORMIN  (GLUCOPHAGE ) 500 MG tablet Take 500 mg by mouth 2 (two) times daily.  3   Polyethylene Glycol 3350  (PEG 3350 ) 17 GM/SCOOP POWD Take 17 g by mouth daily as needed (constipation).     potassium chloride  SA (KLOR-CON  M) 20 MEQ tablet Take 1 tablet (20 mEq total) by  mouth daily. 30 tablet 1   senna-docusate (SENOKOT-S) 8.6-50 MG tablet Take 1 tablet by mouth at bedtime as needed for mild constipation.     Spacer/Aero-Holding Chambers (AEROCHAMBER MV) inhaler Use as instructed 1 each 0   VENTOLIN  HFA 108 (90 Base) MCG/ACT inhaler inhale 2 PUFFS into THE lungs EVERY 6 HOURS AS NEEDED FOR WHEEZING OR SHORTNESS OF BREATH (Patient taking differently: Inhale 2 puffs into the lungs every 6 (six) hours as needed for shortness of breath.) 18 g 11   No current facility-administered medications on file prior to visit.    ALLERGIES: Allergies  Allergen Reactions   Gadolinium Derivatives Nausea And Vomiting and Other (See Comments)    Nausea and vomiting was followed by epileptic seizure episode that lasted approximately 5 minutes; pt was unable to verbally communicate during that time; she then came to and was able to speak; was evaluated by Rad and RN; kms    Grapefruit Flavor [Flavoring Agent (Non-Screening)] Other (See Comments)    Drug interaction    FAMILY HISTORY: Family History  Problem Relation Age of Onset   Stroke Mother    Anxiety disorder Mother    Drug abuse Mother    Alcohol abuse Mother    Depression Mother    Drug abuse Father    Anxiety disorder Father    Heart disease Maternal Grandmother    Colon cancer Neg Hx    Esophageal cancer Neg Hx    Pancreatic cancer Neg Hx    Stomach cancer Neg Hx    Liver disease Neg Hx    Breast cancer Neg Hx       Objective:  Blood pressure 131/68, pulse 96, resp. rate 18, height 5\' 5"  (1.651 m), weight (!) 308 lb (139.7 kg), last menstrual period 05/29/2017, SpO2 96%. General: No acute distress.  Patient appears well-groomed.   Head:  Normocephalic/atraumatic Neck:  Supple.  No paraspinal tenderness.  Full range of motion. Heart:  Regular rate and rhythm. Neuro:  Alert and oriented.  Speech fluent and not dysarthric.  Language intact.  CN II-XII intact.  Bulk and tone normal.  Muscle strength 5/5  throughout.  Deep tendon reflexes 1+ throughout.  Gait steady.  Romberg negative.   Janne Members, DO  CC:  Denece Finger, FNP

## 2023-06-01 ENCOUNTER — Other Ambulatory Visit

## 2023-06-01 ENCOUNTER — Ambulatory Visit: Payer: Medicaid Other | Admitting: Neurology

## 2023-06-01 ENCOUNTER — Encounter: Payer: Self-pay | Admitting: Neurology

## 2023-06-01 VITALS — BP 131/68 | HR 96 | Resp 18 | Ht 65.0 in | Wt 308.0 lb

## 2023-06-01 DIAGNOSIS — Z79899 Other long term (current) drug therapy: Secondary | ICD-10-CM

## 2023-06-01 DIAGNOSIS — G500 Trigeminal neuralgia: Secondary | ICD-10-CM | POA: Diagnosis not present

## 2023-06-01 DIAGNOSIS — M792 Neuralgia and neuritis, unspecified: Secondary | ICD-10-CM

## 2023-06-01 LAB — BASIC METABOLIC PANEL WITH GFR
BUN: 8 mg/dL (ref 7–25)
CO2: 30 mmol/L (ref 20–32)
Calcium: 9.6 mg/dL (ref 8.6–10.4)
Chloride: 103 mmol/L (ref 98–110)
Creat: 0.73 mg/dL (ref 0.50–1.03)
Glucose, Bld: 110 mg/dL — ABNORMAL HIGH (ref 65–99)
Potassium: 3.8 mmol/L (ref 3.5–5.3)
Sodium: 141 mmol/L (ref 135–146)
eGFR: 98 mL/min/{1.73_m2} (ref >=60)

## 2023-06-01 MED ORDER — CARBAMAZEPINE 200 MG PO TABS: 400.0000 mg | ORAL_TABLET | Freq: Two times a day (BID) | ORAL | 1 refills | Status: DC

## 2023-06-01 NOTE — Patient Instructions (Signed)
 Increase carbamazepine  to 2 pills twice daily Continue gabapentin  3 pills three times daily Check BMP lab in 4 weeks. Follow up 6 months.

## 2023-06-15 ENCOUNTER — Ambulatory Visit: Admitting: Nurse Practitioner

## 2023-06-16 ENCOUNTER — Ambulatory Visit: Payer: Medicaid Other | Admitting: Neurology

## 2023-06-22 ENCOUNTER — Ambulatory Visit (INDEPENDENT_AMBULATORY_CARE_PROVIDER_SITE_OTHER): Admitting: Pulmonary Disease

## 2023-06-22 ENCOUNTER — Encounter: Payer: Self-pay | Admitting: Pulmonary Disease

## 2023-06-22 VITALS — BP 132/72 | HR 93 | Ht 65.0 in | Wt 308.0 lb

## 2023-06-22 DIAGNOSIS — Z6841 Body Mass Index (BMI) 40.0 and over, adult: Secondary | ICD-10-CM

## 2023-06-22 DIAGNOSIS — F17210 Nicotine dependence, cigarettes, uncomplicated: Secondary | ICD-10-CM | POA: Diagnosis not present

## 2023-06-22 DIAGNOSIS — J9610 Chronic respiratory failure, unspecified whether with hypoxia or hypercapnia: Secondary | ICD-10-CM | POA: Diagnosis not present

## 2023-06-22 DIAGNOSIS — E66813 Obesity, class 3: Secondary | ICD-10-CM | POA: Diagnosis not present

## 2023-06-22 DIAGNOSIS — G4733 Obstructive sleep apnea (adult) (pediatric): Secondary | ICD-10-CM

## 2023-06-22 NOTE — Progress Notes (Signed)
 Denise [name]    [id number]    [birth year]  Primary Care Physician:Lucas, Sherald Dienes, FNP  Referring Physician: Luba Running, FNP [address] [place],  [place] [address]  Chief complaint:   In for follow-up today  HPI:  Patient with a history of obstructive lung disease, obstructive sleep apnea on CPAP therapy, active smoker, obesity with obesity hypoventilation.  Chronic respiratory failure on oxygen supplementation  Has been doing relatively well since her last visit  Being evaluated for bloody stools, has follow-up with PCP in a few days  She continues to work on a cigarette smoking Down to 3 cigarettes a day  Trying to stay active  Watching her diet well and has continued to lose weight  Feels relatively well overall  She uses oxygen around-the-clock at 2 L  Has been on CPAP for about 4 years Compliant with CPAP Wakes up feeling like she is rested well Download from her machine shows some mask leaks, she states it does not wake her up but does bother her spouse  Recent hospitalization in March for COPD exacerbation  She is limited with activities Requires a walker  Has a history of congestive heart failure, hemorrhagic CVA history, psychogenic seizures, PTSD, depression, anxiety, history of substance abuse in the past, some cognitive dysfunction.  Did have COVID in February 2021, history of syphilis  Outpatient Encounter Medications as of 06/22/2023  Medication Sig   amLODipine  (NORVASC ) 10 MG tablet Take 10 mg by mouth daily.   atorvastatin  (LIPITOR) 20 MG tablet TAKE 1 TABLET BY MOUTH EVERY DAY   BAYER BACK & BODY PAIN EX ST 500-32.5 MG TABS Take 1-2 tablets by mouth every 6 (six) hours as needed (for pain or discomfort).   Budeson-Glycopyrrol-Formoterol  (BREZTRI  AEROSPHERE) 160-9-4.8 MCG/ACT AERO Inhale 2 puffs into the lungs in the morning and at bedtime.   COLD & FLU 5-10-200-325 MG CAPS Take 1-2 capsules by mouth daily as  needed (for flu symptoms).   dextromethorphan -guaiFENesin  (MUCINEX  DM) 30-600 MG 12hr tablet Take 1 tablet by mouth 2 (two) times daily.   furosemide  (LASIX ) 80 MG tablet TAKE 1 TABLET BY MOUTH EVERY DAY (Patient taking differently: Take 80 mg by mouth in the morning.)   gabapentin  (NEURONTIN ) 300 MG capsule TAKE 3 CAPSULES BY MOUTH 3 TIMES DAILY   lisinopril  (ZESTRIL ) 30 MG tablet Take 30 mg by mouth daily.   metFORMIN  (GLUCOPHAGE ) 500 MG tablet Take 500 mg by mouth 2 (two) times daily.   potassium chloride  SA (KLOR-CON  M) 20 MEQ tablet Take 1 tablet (20 mEq total) by mouth daily.   senna-docusate (SENOKOT-S) 8.6-50 MG tablet Take 1 tablet by mouth at bedtime as needed for mild constipation.   VENTOLIN  HFA 108 (90 Base) MCG/ACT inhaler inhale 2 PUFFS into THE lungs EVERY 6 HOURS AS NEEDED FOR WHEEZING OR SHORTNESS OF BREATH (Patient taking differently: Inhale 2 puffs into the lungs every 6 (six) hours as needed for shortness of breath.)   carbamazepine  (TEGRETOL ) 200 MG tablet Take 2 tablets (400 mg total) by mouth 2 (two) times daily. (Patient not taking: Reported on 06/22/2023)   DM-Phenylephrine-Acetaminophen  10-5-325 MG/15ML LIQD Take 15 mLs by mouth every 6 (six) hours as needed (for flu-like symptoms). (Patient not taking: Reported on 06/22/2023)   doxycycline  (VIBRA -TABS) 100 MG tablet Take 1 tablet (100 mg total) by mouth every 12 (twelve) hours. (Patient not taking: Reported on 06/22/2023)   Polyethylene Glycol 3350  (PEG 3350 ) 17 GM/SCOOP  POWD Take 17 g by mouth daily as needed (constipation). (Patient not taking: Reported on 06/22/2023)   Spacer/Aero-Holding Chambers (AEROCHAMBER MV) inhaler Use as instructed (Patient not taking: Reported on 06/22/2023)   No facility-administered encounter medications on file as of 06/22/2023.    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   Chronic diastolic CHF (congestive heart failure) 07/29/2021   Class 3 obesity with alveolar hypoventilation, serious comorbidity, and body mass index (BMI) of 50.0 to 59.9 in adult 06/12/2020   CVA (cerebrovascular accident) 2017   2017 MRI - chronic left lenticulostriate territory hemorrhage or hemorrhagic infarct, with hemosiderin, and encephalomalacia affecting both the caudate and lentiform nucleus     Decreased mobility 06/15/2021   DOE (dyspnea on exertion) 06/12/2020   Essential hypertension 12/18/2014   Fibroid uterus 12/18/2014   Heart murmur, systolic 06/29/2021   History of alcohol abuse 02/16/2017   History of cocaine use 02/16/2017   History of latent syphilis 02/16/2017   Hyperlipidemia associated with type 2 diabetes mellitus 11/07/2020   Incontinence of urine    Lacunar infarction    MRI - chronic lacunar infarct within the posterior right lentiform nucleus.     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   Low back pain 11/21/2017   Lumbar degenerative disc disease    Major depressive disorder 02/16/2017   Nuclear sclerosis of both eyes 10/20/2022   OSA on CPAP 06/12/2020   Overactive bladder    Pain due to onychomycosis of toenails of both feet 05/26/2020   Pelvic pain in female 12/18/2014   Psychogenic nonepileptic seizure 02/17/2017   PTSD (post-traumatic stress disorder)    Pulmonary emphysema 08/31/2021   QT prolongation 06/29/2021   Right sided weakness    Social anxiety disorder 11/03/2017   Syncope 02/07/2015   TIA (transient ischemic attack)    Tobacco use 04/14/2015   Type 2 diabetes mellitus 10/21/2017   Venous stasis dermatitis of both  lower extremities 06/15/2021    Past Surgical History:  Procedure Laterality Date   CESAREAN SECTION     x 2    Family History  Problem Relation Age of Onset   Stroke Mother    Anxiety disorder Mother    Drug abuse Mother    Alcohol abuse Mother    Depression Mother    Drug abuse Father    Anxiety disorder Father    Heart disease Maternal Grandmother    Colon cancer Neg Hx    Esophageal cancer Neg Hx    Pancreatic cancer Neg Hx    Stomach cancer Neg Hx    Liver disease Neg Hx    Breast cancer Neg Hx     Social History   Socioeconomic History   Marital status: Married    Spouse name: Godwin Lat   Number of children: 3   Years of education: 6   Highest education level: 6th grade  Occupational History   Occupation: Disability  Tobacco Use   Smoking status: Every Day    Current packs/day: 0.40    Types: Cigarettes   Smokeless tobacco: Never  Tobacco comments:    Smoking 1 pack of cigarettes a week. 09/21/21 Tay    Patients states she is smoking about 8 cigarettes a day now. AB, CMA 12-08-2022     Pt states she is smoking about 3 cigarettes a day now. CC, CMA 06/22/2023  Vaping Use   Vaping status: Never Used  Substance and Sexual Activity   Alcohol use: Not on file   Drug use: Not Currently    Types: "Crack" cocaine   Sexual activity: Yes    Partners: Male    Birth control/protection: None  Other Topics Concern   Not on file  Social History Narrative   Not on file   Social Drivers of Health   Financial Resource Strain: Not on File (05/14/2021)   Received from [facility], [facility]    Financial Resource Strain: 0  Food Insecurity: No Food Insecurity (04/08/2023)   Hunger Vital Sign    Worried About Running Out of Food in the Last Year: Never true    Ran Out of Food in the Last Year: Never true  Transportation Needs: No Transportation Needs (04/08/2023)   PRAPARE - Administrator, Civil Service (Medical): No    Lack of  Transportation (Non-Medical): No  Physical Activity: Not on File (05/14/2021)   Received from [place], [place]   Physical Activity    Physical Activity: 0  Stress: Not on File (05/14/2021)   Received from [facility], [facility]   Stress    Stress: 0  Social Connections: Unknown (02/17/2023)   Social Connection and Isolation Panel [NHANES]    Frequency of Communication with Friends and Family: Three times a week    Frequency of Social Gatherings with Friends and Family: More than three times a week    Attends Religious Services: Patient unable to answer    Active Member of Clubs or Organizations: Not on file    Attends Banker Meetings: Patient unable to answer    Marital Status: Married  Catering manager Violence: Not At Risk (04/08/2023)   Humiliation, Afraid, Rape, and Kick questionnaire    Fear of Current or Ex-Partner: No    Emotionally Abused: No    Physically Abused: No    Sexually Abused: No    Review of Systems  Constitutional:  Positive for fatigue.  Respiratory:  Positive for apnea and shortness of breath.   Psychiatric/Behavioral:  Positive for sleep disturbance.     Vitals:   06/22/23 0840  BP: 132/72  Pulse: 93  SpO2: 96%     Physical Exam Constitutional:      Appearance: She is obese.  HENT:     Head: Normocephalic.     Mouth/Throat:     Mouth: Mucous membranes are moist.  Eyes:     General: No scleral icterus. Cardiovascular:     Rate and Rhythm: Normal rate and regular rhythm.     Heart sounds: No murmur heard.    No friction rub.  Pulmonary:     Effort: No respiratory distress.     Breath sounds: No stridor. No wheezing or rhonchi.  Musculoskeletal:     Cervical back: No rigidity or tenderness.  Neurological:     Mental Status: She is alert.  Psychiatric:        Mood and Affect: Mood normal.    Data Reviewed: Recent hospital records reviewed  CPAP compliance reviewed with the patient showing very good compliance at 98% Average use of 11  hours 45  minutes CPAP of 17 95 percentile leak of 61.8 AHI of 2.3  Most recent echocardiogram 01/18/2023 shows normal ejection fraction  PFT 02/28/2023 shows moderate restriction with moderately reduced diffusing capacity  Assessment:  Obstructive sleep apnea on CPAP therapy Obesity hypoventilation - Continue CPAP at night - Because of his significant leaks, we are going to reduce the pressure from 17 down to 16  Chronic respiratory failure - Continue oxygen supplementation  Class III obesity - She continues to work on weight loss efforts and she seems to be doing well with this  Active smoker -Smoking cessation counseling - Has come down significantly in amount of cigarette smoke - Currently only smoking about 3 cigarettes a day  History of centrilobular emphysema - Continue Breztri   Deconditioning - Continue to encourage graded activities as tolerated   Plan/Recommendations: Continue weight loss efforts  Smoking cessation counseling  Follow-up in about 3 months  Continue oxygen supplementation during the day  Continue CPAP at night - Reduce pressure to 16 from 17  Continue Breztri   Encouraged to call with significant concerns     Myer Artis MD [facility] 06/22/2023, 9:08 AM  CC: Luba Running, FNP

## 2023-06-22 NOTE — Patient Instructions (Signed)
 I will see you back in about 3 months  DME referral for CPAP change Change pressure from 17-16  Continue working on quitting smoking  Continue your weight loss efforts - The trend of your weight has definitely been downwards - Continue the good work  Call us  with significant concerns

## 2023-06-23 ENCOUNTER — Other Ambulatory Visit: Payer: Self-pay

## 2023-06-23 ENCOUNTER — Encounter (HOSPITAL_COMMUNITY): Payer: Self-pay

## 2023-06-23 ENCOUNTER — Emergency Department (HOSPITAL_COMMUNITY)
Admission: EM | Admit: 2023-06-23 | Discharge: 2023-06-23 | Discharge status: Against Medical Advice | Attending: Emergency Medicine | Admitting: Emergency Medicine

## 2023-06-23 ENCOUNTER — Emergency Department (HOSPITAL_COMMUNITY)

## 2023-06-23 DIAGNOSIS — I639 Cerebral infarction, unspecified: Secondary | ICD-10-CM | POA: Diagnosis present

## 2023-06-23 DIAGNOSIS — Z7984 Long term (current) use of oral hypoglycemic drugs: Secondary | ICD-10-CM | POA: Diagnosis not present

## 2023-06-23 DIAGNOSIS — I110 Hypertensive heart disease with heart failure: Secondary | ICD-10-CM | POA: Insufficient documentation

## 2023-06-23 DIAGNOSIS — I5032 Chronic diastolic (congestive) heart failure: Secondary | ICD-10-CM | POA: Diagnosis present

## 2023-06-23 DIAGNOSIS — G4733 Obstructive sleep apnea (adult) (pediatric): Secondary | ICD-10-CM

## 2023-06-23 DIAGNOSIS — R1084 Generalized abdominal pain: Secondary | ICD-10-CM

## 2023-06-23 DIAGNOSIS — E119 Type 2 diabetes mellitus without complications: Secondary | ICD-10-CM

## 2023-06-23 DIAGNOSIS — Z79899 Other long term (current) drug therapy: Secondary | ICD-10-CM | POA: Insufficient documentation

## 2023-06-23 DIAGNOSIS — E662 Morbid (severe) obesity with alveolar hypoventilation: Secondary | ICD-10-CM

## 2023-06-23 DIAGNOSIS — K625 Hemorrhage of anus and rectum: Secondary | ICD-10-CM

## 2023-06-23 DIAGNOSIS — Z720 Tobacco use: Secondary | ICD-10-CM | POA: Diagnosis present

## 2023-06-23 DIAGNOSIS — R1031 Right lower quadrant pain: Secondary | ICD-10-CM | POA: Diagnosis present

## 2023-06-23 DIAGNOSIS — D72829 Elevated white blood cell count, unspecified: Secondary | ICD-10-CM | POA: Diagnosis not present

## 2023-06-23 DIAGNOSIS — J9611 Chronic respiratory failure with hypoxia: Secondary | ICD-10-CM | POA: Diagnosis present

## 2023-06-23 DIAGNOSIS — I10 Essential (primary) hypertension: Secondary | ICD-10-CM | POA: Diagnosis present

## 2023-06-23 DIAGNOSIS — K921 Melena: Secondary | ICD-10-CM | POA: Diagnosis not present

## 2023-06-23 DIAGNOSIS — F1491 Cocaine use, unspecified, in remission: Secondary | ICD-10-CM | POA: Diagnosis present

## 2023-06-23 DIAGNOSIS — J449 Chronic obstructive pulmonary disease, unspecified: Secondary | ICD-10-CM | POA: Diagnosis present

## 2023-06-23 DIAGNOSIS — E66813 Obesity, class 3: Secondary | ICD-10-CM

## 2023-06-23 DIAGNOSIS — I509 Heart failure, unspecified: Secondary | ICD-10-CM | POA: Diagnosis not present

## 2023-06-23 LAB — CBC WITH DIFFERENTIAL/PLATELET
Abs Immature Granulocytes: 0.05 10*3/uL (ref 0.00–0.07)
Basophils Absolute: 0.1 10*3/uL (ref 0.0–0.1)
Basophils Relative: 0 %
Eosinophils Absolute: 0.2 10*3/uL (ref 0.0–0.5)
Eosinophils Relative: 1 %
HCT: 42.9 % (ref 36.0–46.0)
Hemoglobin: 13.9 g/dL (ref 12.0–15.0)
Immature Granulocytes: 0 %
Lymphocytes Relative: 31 %
Lymphs Abs: 3.4 10*3/uL (ref 0.7–4.0)
MCH: 29.4 pg (ref 26.0–34.0)
MCHC: 32.4 g/dL (ref 30.0–36.0)
MCV: 90.7 fL (ref 80.0–100.0)
Monocytes Absolute: 0.8 10*3/uL (ref 0.1–1.0)
Monocytes Relative: 7 %
Neutro Abs: 6.7 10*3/uL (ref 1.7–7.7)
Neutrophils Relative %: 61 %
Platelets: 315 10*3/uL (ref 150–400)
RBC: 4.73 MIL/uL (ref 3.87–5.11)
RDW: 14.9 % (ref 11.5–15.5)
WBC: 11.2 10*3/uL — ABNORMAL HIGH (ref 4.0–10.5)
nRBC: 0 % (ref 0.0–0.2)

## 2023-06-23 LAB — COMPREHENSIVE METABOLIC PANEL WITH GFR
ALT: 14 U/L (ref 0–44)
AST: 18 U/L (ref 15–41)
Albumin: 3.9 g/dL (ref 3.5–5.0)
Alkaline Phosphatase: 124 U/L (ref 38–126)
Anion gap: 10 (ref 5–15)
BUN: 9 mg/dL (ref 6–20)
CO2: 25 mmol/L (ref 22–32)
Calcium: 9 mg/dL (ref 8.9–10.3)
Chloride: 100 mmol/L (ref 98–111)
Creatinine, Ser: 0.66 mg/dL (ref 0.44–1.00)
GFR, Estimated: >60 mL/min (ref >60)
Glucose, Bld: 115 mg/dL — ABNORMAL HIGH (ref 70–99)
Potassium: 3.4 mmol/L — ABNORMAL LOW (ref 3.5–5.1)
Sodium: 135 mmol/L (ref 135–145)
Total Bilirubin: 0.6 mg/dL (ref 0.0–1.2)
Total Protein: 7.2 g/dL (ref 6.5–8.1)

## 2023-06-23 LAB — POC OCCULT BLOOD, ED: Fecal Occult Bld: POSITIVE — AB

## 2023-06-23 MED ORDER — IOHEXOL 300 MG/ML  SOLN
100.0000 mL | Freq: Once | INTRAMUSCULAR | Status: AC | PRN
  Administered 2023-06-23: 100 mL via INTRAVENOUS

## 2023-06-23 MED ORDER — SODIUM CHLORIDE (PF) 0.9 % IJ SOLN
INTRAMUSCULAR | Status: AC
  Filled 2023-06-23: qty 50

## 2023-06-23 NOTE — Discharge Summary (Signed)
 Denise [name] WUJ:[id number] DOB: [birth year] DOA: 06/23/2023  PCP: Luba Running, FNP  Admit date: 06/23/2023 Discharge date: 06/23/2023  Time spent: 25 minutes  Outpatient f/u: - ct chest to further characterize incidental pulmonary nodules seen on CT of abdomen - further evaluation of rectal bleeding, advise ER f/u if persists   Discharge Diagnoses:  Active Problems:   OSA on CPAP   Type 2 diabetes mellitus   Chronic diastolic CHF (congestive heart failure)   Essential hypertension   Tobacco use   Class 3 obesity with alveolar hypoventilation, serious comorbidity, and body mass index (BMI) of 50.0 to 59.9 in adult   CVA (cerebrovascular accident)   History of cocaine use   Chronic respiratory failure with hypoxia (HCC)   COPD (chronic obstructive pulmonary disease) (HCC)   There were no vitals filed for this visit.  History of present illness and hospital course:  56f hx osa, copd, on 2 liters o2, dCHF, cva, dm, morbid obesity, presenting with RLQ abdominal pain for about 2 weeks and also 2 weeks of bright red blood on toilet paper when wiping also small amounts of blood when stooling and with flatus. Hemodynamically stable, hgb wnl, CT abdomen/pelvis nothing acute. Patient then left against medical advice without notifying hospital staff of her desire to leave. Reached on the telephone she explained she got tired of waiting. While the normal hemoglobin and hemodynamic stability are reassuring, her description of the quantity of blood loss merits further evaluation, and I advised her to return to the hospital, or at minimum follow up closely with her PCP. She says she has an appointment with her pcp tomorrow. Unless a benign etiology can be determined (such as bleeding hemorrhoids), or unless outpatient colonoscopy can be arranged in a short amount of time, it is likely this condition will require re-evaluation in the ER. Bleeding precautions reviewed with the patient.     Procedures: none   Consultations: GI (who never saw)  Discharge Exam: Vitals:   06/23/23 0700 06/23/23 0706  BP: (!) 147/91   Pulse: 72   Resp: 19   Temp:  98 F (36.7 C)  SpO2: 96%     Exam See ED provider note  Discharge Instructions     Allergies  Allergen Reactions   Gadolinium Derivatives Nausea And Vomiting and Other (See Comments)    Nausea and vomiting was followed by epileptic seizure episode that lasted approximately 5 minutes; pt was unable to verbally communicate during that time; she then came to and was able to speak; was evaluated by Rad and RN; kms    Grapefruit Flavor [Flavoring Agent (Non-Screening)] Other (See Comments)    Drug interaction      The results of significant diagnostics from this hospitalization (including imaging, microbiology, ancillary and laboratory) are listed below for reference.    Significant Diagnostic Studies: CT ABDOMEN PELVIS W CONTRAST Result Date: 06/23/2023 CLINICAL DATA:  Abdominal pain.  Rectal bleeding. EXAM: CT ABDOMEN AND PELVIS WITH CONTRAST TECHNIQUE: Multidetector CT imaging of the abdomen and pelvis was performed using the standard protocol following bolus administration of intravenous contrast. RADIATION DOSE REDUCTION: This exam was performed according to the departmental dose-optimization program which includes automated exposure control, adjustment of the mA and/or kV according to patient size and/or use of iterative reconstruction technique. CONTRAST:  OMNIPAQUE  IOHEXOL  300 MG/ML  SOLN COMPARISON:  10/24/2022 FINDINGS: Lower chest: Multiple new pulmonary nodules are identified in the lung bases including 7 mm right lower lobe nodule on image 10/series  5 in 6 mm left lower lobe nodule on image 7/series 5. Hepatobiliary: No suspicious focal abnormality within the liver parenchyma. There is no evidence for gallstones, gallbladder wall thickening, or pericholecystic fluid. No intrahepatic or extrahepatic  biliary dilation. Pancreas: No focal mass lesion. No dilatation of the main duct. No intraparenchymal cyst. No peripancreatic edema. Spleen: No splenomegaly. No suspicious focal mass lesion. Adrenals/Urinary Tract: No adrenal nodule or mass. Kidneys unremarkable. No evidence for hydroureter. The urinary bladder appears normal for the degree of distention. Stomach/Bowel: Stomach is unremarkable. No gastric wall thickening. No evidence of outlet obstruction. Duodenum is normally positioned as is the ligament of Treitz. No small bowel wall thickening. No small bowel dilatation. The terminal ileum is normal. The appendix is normal. No gross colonic mass. No colonic wall thickening. Diverticular changes are noted in the left colon without evidence of diverticulitis. Vascular/Lymphatic: There is mild atherosclerotic calcification of the abdominal aorta without aneurysm. There is no gastrohepatic or hepatoduodenal ligament lymphadenopathy. No retroperitoneal or mesenteric lymphadenopathy. Bilobed retroperitoneal fluid density structure again identified with dominant component measuring 3.4 cm today compared to 3.3 cm (remeasured) previously. Lesion has no perceptible wall or internal architecture and is homogeneous. No pelvic sidewall lymphadenopathy. Reproductive: Calcified uterine fibroids evident. There is no adnexal mass. Other: No intraperitoneal free fluid. Musculoskeletal: No worrisome lytic or sclerotic osseous abnormality. IMPRESSION: 1. No acute findings in the abdomen or pelvis. Specifically, no findings to explain the patient's history of abdominal pain and rectal bleeding. 2. Multiple new pulmonary nodules in the lung bases measuring up to 7 mm. Close follow-up warranted and consider chest CT without contrast to further evaluate. 3. Bilobed retroperitoneal fluid density structure again identified with dominant component measuring 3.4 cm today compared to 3.3 cm (remeasured) previously. Lesion has no  perceptible wall or internal architecture and is homogeneous. Previous study described this structure is being present since at least 2019. Imaging features most suggestive of benign etiology such as lymphangioma. 4. Left colonic diverticulosis without diverticulitis. 5. Calcified uterine fibroids. 6.  Aortic Atherosclerosis (ICD10-I70.0). Electronically Signed   By: Donnal Fusi M.D.   On: 06/23/2023 05:09    Microbiology: No results found for this or any previous visit (from the past 240 hours).   Labs: Basic Metabolic Panel: Recent Labs  Lab 06/23/23 0331  NA 135  K 3.4*  CL 100  CO2 25  GLUCOSE 115*  BUN 9  CREATININE 0.66  CALCIUM  9.0   Liver Function Tests: Recent Labs  Lab 06/23/23 0331  AST 18  ALT 14  ALKPHOS 124  BILITOT 0.6  PROT 7.2  ALBUMIN 3.9   No results for input(s): "LIPASE", "AMYLASE" in the last 168 hours. No results for input(s): "AMMONIA" in the last 168 hours. CBC: Recent Labs  Lab 06/23/23 0331  WBC 11.2*  NEUTROABS 6.7  HGB 13.9  HCT 42.9  MCV 90.7  PLT 315   Cardiac Enzymes: No results for input(s): "CKTOTAL", "CKMB", "CKMBINDEX", "TROPONINI" in the last 168 hours. BNP: BNP (last 3 results) Recent Labs    10/24/22 0859 02/17/23 1649  BNP 19.8 37.3    ProBNP (last 3 results) No results for input(s): "PROBNP" in the last 8760 hours.  CBG: No results for input(s): "GLUCAP" in the last 168 hours.     Signed:  Raymonde Calico MD.  [facility] 06/23/2023, 8:27 AM

## 2023-06-23 NOTE — ED Provider Notes (Signed)
 [place] EMERGENCY DEPARTMENT AT [facility] Provider Note   CSN: [id number] Arrival date & time: 06/23/23  0214     History  Chief Complaint  Patient presents with   Rectal Bleeding    Meryn Humphrey-Headen is a 53 y.o. female with past medical history of HTN, tobacco use, OSA, HLD, T2DM, heart murmur, QT prolongation, CHF, CVA, COPD presents to Emergency Department for evaluation of rectal bleeding and RLQ abdominal pain for the past 2 weeks.  She reports that she has been going once a day for the past 2 weeks and noticed BRBPR in toilet water and with wiping.  She also endorses some associated dizziness and lightheadedness for the past 2 weeks. No thinners  Colonoscopy 2017 that showed diverticulosis with recommendation for 10 year repeat Upper endoscopy on 2018 showed gastritis by Dr. Dominic Friendly at [facility]    Rectal Bleeding      Home Medications Prior to Admission medications   Medication Sig Start Date End Date Taking? Authorizing Provider  amLODipine  (NORVASC ) 10 MG tablet Take 10 mg by mouth daily. 05/25/23   [provider]  atorvastatin  (LIPITOR) 20 MG tablet TAKE 1 TABLET BY MOUTH EVERY DAY 07/13/22   Chandrasekhar, Mahesh A, MD  BAYER BACK & BODY PAIN EX ST 500-32.5 MG TABS Take 1-2 tablets by mouth every 6 (six) hours as needed (for pain or discomfort).    [provider]  Budeson-Glycopyrrol-Formoterol  (BREZTRI  AEROSPHERE) 160-9-4.8 MCG/ACT AERO Inhale 2 puffs into the lungs in the morning and at bedtime. 02/28/23   Cobb, Mariah Shines, NP  carbamazepine  (TEGRETOL ) 200 MG tablet Take 2 tablets (400 mg total) by mouth 2 (two) times daily. Patient not taking: Reported on 06/22/2023 06/01/23   Merriam Abbey, DO  COLD & FLU 5-10-200-325 MG CAPS Take 1-2 capsules by mouth daily as needed (for flu symptoms).    [provider]  dextromethorphan -guaiFENesin  (MUCINEX  DM) 30-600 MG 12hr tablet Take 1 tablet by mouth 2 (two) times daily. 02/20/23    Akula, Vijaya, MD  DM-Phenylephrine-Acetaminophen  10-5-325 MG/15ML LIQD Take 15 mLs by mouth every 6 (six) hours as needed (for flu-like symptoms). Patient not taking: Reported on 06/22/2023    [provider]  doxycycline  (VIBRA -TABS) 100 MG tablet Take 1 tablet (100 mg total) by mouth every 12 (twelve) hours. Patient not taking: Reported on 06/22/2023 04/10/23   Haydee Lipa, MD  furosemide  (LASIX ) 80 MG tablet TAKE 1 TABLET BY MOUTH EVERY DAY Patient taking differently: Take 80 mg by mouth in the morning. 12/30/22   Chandrasekhar, Caretha Chapel, MD  gabapentin  (NEURONTIN ) 300 MG capsule TAKE 3 CAPSULES BY MOUTH 3 TIMES DAILY 05/09/23   Festus Hubert, Adam R, DO  lisinopril  (ZESTRIL ) 30 MG tablet Take 30 mg by mouth daily. 03/29/23   [provider]  metFORMIN  (GLUCOPHAGE ) 500 MG tablet Take 500 mg by mouth 2 (two) times daily. 05/25/17   [provider]  Polyethylene Glycol 3350  (PEG 3350 ) 17 GM/SCOOP POWD Take 17 g by mouth daily as needed (constipation). Patient not taking: Reported on 06/22/2023 11/17/20   [provider]  potassium chloride  SA (KLOR-CON  M) 20 MEQ tablet Take 1 tablet (20 mEq total) by mouth daily. 08/01/21   Leona Rake, MD  senna-docusate (SENOKOT-S) 8.6-50 MG tablet Take 1 tablet by mouth at bedtime as needed for mild constipation. 02/20/23   Akula, Vijaya, MD  Spacer/Aero-Holding Chambers (AEROCHAMBER MV) inhaler Use as instructed Patient not taking: Reported on 06/22/2023 09/21/21   Matilde Son,  Vineet, MD  VENTOLIN  HFA 108 (90 Base) MCG/ACT inhaler inhale 2 PUFFS into THE lungs EVERY 6 HOURS AS NEEDED FOR WHEEZING OR SHORTNESS OF BREATH Patient taking differently: Inhale 2 puffs into the lungs every 6 (six) hours as needed for shortness of breath. 02/15/22   Sood, Vineet, MD      Allergies    Gadolinium derivatives and Grapefruit flavor [flavoring agent (non-screening)]    Review of Systems   Review of Systems  Gastrointestinal:  Positive for  hematochezia.    Physical Exam Updated Vital Signs BP 126/70   Pulse 73   Temp 97.7 F (36.5 C) (Oral)   Resp 17   Ht 5\' 5"  (1.651 m)   LMP 05/29/2017 (Approximate)   SpO2 97%   BMI 51.25 kg/m  Physical Exam Vitals and nursing note reviewed. Exam conducted with a chaperone present.  Constitutional:      General: She is not in acute distress.    Appearance: Normal appearance.  HENT:     Head: Normocephalic and atraumatic.  Eyes:     Conjunctiva/sclera: Conjunctivae normal.  Cardiovascular:     Rate and Rhythm: Normal rate.  Pulmonary:     Effort: Pulmonary effort is normal. No respiratory distress.     Breath sounds: Normal breath sounds.     Comments: On 2L O2 via [place] Chest:     Chest wall: No tenderness.  Abdominal:     General: Bowel sounds are normal.     Palpations: Abdomen is soft.     Tenderness: There is generalized abdominal tenderness.  Genitourinary:    Comments: Normal rectal tone. Mild rectal pain with internal exam. Gross BRBPR Skin:    General: Skin is warm.     Capillary Refill: Capillary refill takes less than 2 seconds.     Coloration: Skin is not jaundiced or pale.  Neurological:     Mental Status: She is alert and oriented to person, place, and time. Mental status is at baseline.   Rosevelt Constable RN chaperoned GU exam  ED Results / Procedures / Treatments   Labs (all labs ordered are listed, but only abnormal results are displayed) Labs Reviewed  CBC WITH DIFFERENTIAL/PLATELET - Abnormal; Notable for the following components:      Result Value   WBC 11.2 (*)    All other components within normal limits  COMPREHENSIVE METABOLIC PANEL WITH GFR - Abnormal; Notable for the following components:   Potassium 3.4 (*)    Glucose, Bld 115 (*)    All other components within normal limits  POC OCCULT BLOOD, ED - Abnormal; Notable for the following components:   Fecal Occult Bld POSITIVE (*)    All other components within normal limits     EKG None  Radiology CT ABDOMEN PELVIS W CONTRAST Result Date: 06/23/2023 CLINICAL DATA:  Abdominal pain.  Rectal bleeding. EXAM: CT ABDOMEN AND PELVIS WITH CONTRAST TECHNIQUE: Multidetector CT imaging of the abdomen and pelvis was performed using the standard protocol following bolus administration of intravenous contrast. RADIATION DOSE REDUCTION: This exam was performed according to the departmental dose-optimization program which includes automated exposure control, adjustment of the mA and/or kV according to patient size and/or use of iterative reconstruction technique. CONTRAST:  OMNIPAQUE  IOHEXOL  300 MG/ML  SOLN COMPARISON:  10/24/2022 FINDINGS: Lower chest: Multiple new pulmonary nodules are identified in the lung bases including 7 mm right lower lobe nodule on image 10/series 5 in 6 mm left lower lobe nodule on image 7/series 5. Hepatobiliary: No  suspicious focal abnormality within the liver parenchyma. There is no evidence for gallstones, gallbladder wall thickening, or pericholecystic fluid. No intrahepatic or extrahepatic biliary dilation. Pancreas: No focal mass lesion. No dilatation of the main duct. No intraparenchymal cyst. No peripancreatic edema. Spleen: No splenomegaly. No suspicious focal mass lesion. Adrenals/Urinary Tract: No adrenal nodule or mass. Kidneys unremarkable. No evidence for hydroureter. The urinary bladder appears normal for the degree of distention. Stomach/Bowel: Stomach is unremarkable. No gastric wall thickening. No evidence of outlet obstruction. Duodenum is normally positioned as is the ligament of Treitz. No small bowel wall thickening. No small bowel dilatation. The terminal ileum is normal. The appendix is normal. No gross colonic mass. No colonic wall thickening. Diverticular changes are noted in the left colon without evidence of diverticulitis. Vascular/Lymphatic: There is mild atherosclerotic calcification of the abdominal aorta without aneurysm. There  is no gastrohepatic or hepatoduodenal ligament lymphadenopathy. No retroperitoneal or mesenteric lymphadenopathy. Bilobed retroperitoneal fluid density structure again identified with dominant component measuring 3.4 cm today compared to 3.3 cm (remeasured) previously. Lesion has no perceptible wall or internal architecture and is homogeneous. No pelvic sidewall lymphadenopathy. Reproductive: Calcified uterine fibroids evident. There is no adnexal mass. Other: No intraperitoneal free fluid. Musculoskeletal: No worrisome lytic or sclerotic osseous abnormality. IMPRESSION: 1. No acute findings in the abdomen or pelvis. Specifically, no findings to explain the patient's history of abdominal pain and rectal bleeding. 2. Multiple new pulmonary nodules in the lung bases measuring up to 7 mm. Close follow-up warranted and consider chest CT without contrast to further evaluate. 3. Bilobed retroperitoneal fluid density structure again identified with dominant component measuring 3.4 cm today compared to 3.3 cm (remeasured) previously. Lesion has no perceptible wall or internal architecture and is homogeneous. Previous study described this structure is being present since at least 2019. Imaging features most suggestive of benign etiology such as lymphangioma. 4. Left colonic diverticulosis without diverticulitis. 5. Calcified uterine fibroids. 6.  Aortic Atherosclerosis (ICD10-I70.0). Electronically Signed   By: Donnal Fusi M.D.   On: 06/23/2023 05:09    Procedures Procedures    Medications Ordered in ED Medications  iohexol  (OMNIPAQUE ) 300 MG/ML solution 100 mL (100 mLs Intravenous Contrast Given 06/23/23 0437)    ED Course/ Medical Decision Making/ A&P                                 Medical Decision Making Amount and/or Complexity of Data Reviewed Labs: ordered. Radiology: ordered.  Risk Prescription drug management. Decision regarding hospitalization.   Patient presents to the ED for concern of  abd pain, rectal bleeding, this involves an extensive number of treatment options, and is a complaint that carries with it a high risk of complications and morbidity.  The differential diagnosis includes diverticulitis, appendicitis, mesenteric ischemia, GIB, hemorrhoids, obstruction, perforation   Co morbidities that complicate the patient evaluation  See HPI   Additional history obtained:  Additional history obtained from Nursing   External records from outside source obtained and reviewed including triage RN note   Lab Tests:  I Ordered, and personally interpreted labs.  The pertinent results include:   Mild leukocytosis of 11.2 Hgb 13.9 Fecal occult positive   Imaging Studies ordered:  I ordered imaging studies including CT abdomen pelvis with contrast I independently visualized and interpreted imaging which showed no acute findings in the abdomen or pelvis.  Multiple new pulmonary nodules in the lung bases measuring up to  7 mm.  Diverticulosis I agree with the radiologist interpretation   Cardiac Monitoring:  The patient was maintained on a cardiac monitor.  I personally viewed and interpreted the cardiac monitored which showed an underlying rhythm of: NSR with no T wave nor ST abnormalities    Consultations Obtained:  I requested consultation with hospitalist Dr. Michell Ahumada,  and discussed lab and imaging findings as well as pertinent plan - will have morning admitting team admit patient and come see I requested consultation via secure messaging with GI Dr. Yvone Herd, and discussed lab and imaging findings as well as pertinent plan-patient is added to morning GI rounding team and they will consult on patient   Problem List / ED Course:  Rectal bleeding Fecal occult + and gross BRBPR noted on GU exam. Hgb WNL No thinners Hemodynamically stable Last colonoscopy and endoscopy in 2018 with [place]  Abd pain Generalized abd pain. She reports worsening RLQ pain CT  negative for acute abnormalities No gross leukocytosis nor electrolyte abnormalities   Reevaluation:  After the interventions noted above, I reevaluated the patient and found that they have :stayed the same    Dispostion:  After consideration of the diagnostic results and the patients response to treatment, I feel that the patent would benefit from admission with GI consult.   Discussed ED workup, plan with patient who expressed understanding and agrees with plan  Final Clinical Impression(s) / ED Diagnoses Final diagnoses:  Rectal bleeding  Generalized abdominal pain    Rx / DC Orders ED Discharge Orders     None         Royann Cords, PA 06/23/23 [id number]    Onetha Bile, MD 07/05/23 0020

## 2023-06-23 NOTE — ED Triage Notes (Signed)
 Pt reports bright red blood per rectum with bowel movement x1 week Pt reports pain to rectum as well. Reports lower abdominal pain.

## 2023-06-24 ENCOUNTER — Other Ambulatory Visit: Payer: Self-pay

## 2023-06-24 ENCOUNTER — Observation Stay (HOSPITAL_COMMUNITY)
Admission: EM | Admit: 2023-06-24 | Discharge: 2023-06-25 | Disposition: A | Discharge status: Home Health | Attending: Internal Medicine | Admitting: Internal Medicine

## 2023-06-24 ENCOUNTER — Encounter (HOSPITAL_COMMUNITY): Payer: Self-pay | Admitting: Emergency Medicine

## 2023-06-24 DIAGNOSIS — I5032 Chronic diastolic (congestive) heart failure: Secondary | ICD-10-CM | POA: Diagnosis present

## 2023-06-24 DIAGNOSIS — Z79899 Other long term (current) drug therapy: Secondary | ICD-10-CM | POA: Insufficient documentation

## 2023-06-24 DIAGNOSIS — I10 Essential (primary) hypertension: Secondary | ICD-10-CM | POA: Diagnosis present

## 2023-06-24 DIAGNOSIS — K625 Hemorrhage of anus and rectum: Principal | ICD-10-CM

## 2023-06-24 DIAGNOSIS — Z7984 Long term (current) use of oral hypoglycemic drugs: Secondary | ICD-10-CM | POA: Diagnosis not present

## 2023-06-24 DIAGNOSIS — F17210 Nicotine dependence, cigarettes, uncomplicated: Secondary | ICD-10-CM | POA: Diagnosis not present

## 2023-06-24 DIAGNOSIS — E119 Type 2 diabetes mellitus without complications: Secondary | ICD-10-CM | POA: Diagnosis not present

## 2023-06-24 DIAGNOSIS — K219 Gastro-esophageal reflux disease without esophagitis: Secondary | ICD-10-CM | POA: Diagnosis not present

## 2023-06-24 DIAGNOSIS — G4733 Obstructive sleep apnea (adult) (pediatric): Secondary | ICD-10-CM

## 2023-06-24 DIAGNOSIS — E66813 Obesity, class 3: Secondary | ICD-10-CM

## 2023-06-24 DIAGNOSIS — E662 Morbid (severe) obesity with alveolar hypoventilation: Secondary | ICD-10-CM

## 2023-06-24 DIAGNOSIS — I110 Hypertensive heart disease with heart failure: Secondary | ICD-10-CM | POA: Diagnosis not present

## 2023-06-24 DIAGNOSIS — Z6841 Body Mass Index (BMI) 40.0 and over, adult: Secondary | ICD-10-CM | POA: Insufficient documentation

## 2023-06-24 DIAGNOSIS — Z8673 Personal history of transient ischemic attack (TIA), and cerebral infarction without residual deficits: Secondary | ICD-10-CM | POA: Insufficient documentation

## 2023-06-24 DIAGNOSIS — K922 Gastrointestinal hemorrhage, unspecified: Secondary | ICD-10-CM | POA: Diagnosis not present

## 2023-06-24 LAB — CBC WITH DIFFERENTIAL/PLATELET
Abs Immature Granulocytes: 0.08 10*3/uL — ABNORMAL HIGH (ref 0.00–0.07)
Basophils Absolute: 0.1 10*3/uL (ref 0.0–0.1)
Basophils Relative: 1 %
Eosinophils Absolute: 0.1 10*3/uL (ref 0.0–0.5)
Eosinophils Relative: 1 %
HCT: 39.4 % (ref 36.0–46.0)
Hemoglobin: 13 g/dL (ref 12.0–15.0)
Immature Granulocytes: 1 %
Lymphocytes Relative: 25 %
Lymphs Abs: 2.4 10*3/uL (ref 0.7–4.0)
MCH: 29.6 pg (ref 26.0–34.0)
MCHC: 33 g/dL (ref 30.0–36.0)
MCV: 89.7 fL (ref 80.0–100.0)
Monocytes Absolute: 0.8 10*3/uL (ref 0.1–1.0)
Monocytes Relative: 8 %
Neutro Abs: 6.3 10*3/uL (ref 1.7–7.7)
Neutrophils Relative %: 64 %
Platelets: 298 10*3/uL (ref 150–400)
RBC: 4.39 MIL/uL (ref 3.87–5.11)
RDW: 14.8 % (ref 11.5–15.5)
WBC: 9.7 10*3/uL (ref 4.0–10.5)
nRBC: 0 % (ref 0.0–0.2)

## 2023-06-24 LAB — BASIC METABOLIC PANEL WITH GFR
Anion gap: 12 (ref 5–15)
BUN: 7 mg/dL (ref 6–20)
CO2: 22 mmol/L (ref 22–32)
Calcium: 8.7 mg/dL — ABNORMAL LOW (ref 8.9–10.3)
Chloride: 105 mmol/L (ref 98–111)
Creatinine, Ser: 0.44 mg/dL (ref 0.44–1.00)
GFR, Estimated: >60 mL/min (ref >60)
Glucose, Bld: 99 mg/dL (ref 70–99)
Potassium: 3.9 mmol/L (ref 3.5–5.1)
Sodium: 139 mmol/L (ref 135–145)

## 2023-06-24 LAB — TYPE AND SCREEN
ABO/RH(D): A POS
Antibody Screen: NEGATIVE

## 2023-06-24 LAB — GLUCOSE, CAPILLARY
Glucose-Capillary: 129 mg/dL — ABNORMAL HIGH (ref 70–99)
Glucose-Capillary: 139 mg/dL — ABNORMAL HIGH (ref 70–99)
Glucose-Capillary: 92 mg/dL (ref 70–99)
Glucose-Capillary: 94 mg/dL (ref 70–99)

## 2023-06-24 MED ORDER — AMLODIPINE BESYLATE 5 MG PO TABS
10.0000 mg | ORAL_TABLET | Freq: Every day | ORAL | Status: DC
  Administered 2023-06-25: 10 mg via ORAL
  Filled 2023-06-24: qty 2

## 2023-06-24 MED ORDER — ALBUTEROL SULFATE HFA 108 (90 BASE) MCG/ACT IN AERS: 2.0000 | INHALATION_SPRAY | Freq: Four times a day (QID) | RESPIRATORY_TRACT | Status: DC | PRN

## 2023-06-24 MED ORDER — CARBAMAZEPINE 200 MG PO TABS
400.0000 mg | ORAL_TABLET | Freq: Two times a day (BID) | ORAL | Status: DC
  Administered 2023-06-24 – 2023-06-25 (×2): 400 mg via ORAL
  Filled 2023-06-24 (×2): qty 2

## 2023-06-24 MED ORDER — POTASSIUM CHLORIDE CRYS ER 20 MEQ PO TBCR
20.0000 meq | EXTENDED_RELEASE_TABLET | Freq: Every day | ORAL | Status: DC
  Administered 2023-06-25: 20 meq via ORAL
  Filled 2023-06-24: qty 1

## 2023-06-24 MED ORDER — METFORMIN HCL 500 MG PO TABS: 500.0000 mg | ORAL_TABLET | Freq: Two times a day (BID) | ORAL | Status: DC

## 2023-06-24 MED ORDER — BUDESON-GLYCOPYRROL-FORMOTEROL 160-9-4.8 MCG/ACT IN AERO
2.0000 | INHALATION_SPRAY | Freq: Two times a day (BID) | RESPIRATORY_TRACT | Status: DC
  Administered 2023-06-24 – 2023-06-25 (×2): 2 via RESPIRATORY_TRACT
  Filled 2023-06-24: qty 5.9

## 2023-06-24 MED ORDER — LISINOPRIL 20 MG PO TABS
30.0000 mg | ORAL_TABLET | Freq: Every day | ORAL | Status: DC
  Administered 2023-06-25: 30 mg via ORAL
  Filled 2023-06-24: qty 1

## 2023-06-24 MED ORDER — SENNOSIDES-DOCUSATE SODIUM 8.6-50 MG PO TABS: 1.0000 | ORAL_TABLET | Freq: Every evening | ORAL | Status: DC | PRN

## 2023-06-24 MED ORDER — INSULIN ASPART 100 UNIT/ML IJ SOLN
0.0000 [IU] | INTRAMUSCULAR | Status: DC
  Administered 2023-06-24 – 2023-06-25 (×3): 2 [IU] via SUBCUTANEOUS
  Filled 2023-06-24: qty 0.15

## 2023-06-24 MED ORDER — ALBUTEROL SULFATE (2.5 MG/3ML) 0.083% IN NEBU: 2.5000 mg | INHALATION_SOLUTION | Freq: Four times a day (QID) | RESPIRATORY_TRACT | Status: DC | PRN

## 2023-06-24 MED ORDER — ATORVASTATIN CALCIUM 10 MG PO TABS
20.0000 mg | ORAL_TABLET | Freq: Every day | ORAL | Status: DC
  Administered 2023-06-25: 20 mg via ORAL
  Filled 2023-06-24: qty 2

## 2023-06-24 MED ORDER — GABAPENTIN 300 MG PO CAPS
900.0000 mg | ORAL_CAPSULE | Freq: Three times a day (TID) | ORAL | Status: DC
  Administered 2023-06-24 – 2023-06-25 (×2): 900 mg via ORAL
  Filled 2023-06-24: qty 9
  Filled 2023-06-24: qty 3

## 2023-06-24 MED ORDER — LISINOPRIL 10 MG PO TABS: 30.0000 mg | ORAL_TABLET | Freq: Every day | ORAL | Status: DC

## 2023-06-24 MED ORDER — FUROSEMIDE 40 MG PO TABS
80.0000 mg | ORAL_TABLET | Freq: Every day | ORAL | Status: DC
  Administered 2023-06-25: 80 mg via ORAL
  Filled 2023-06-24: qty 2

## 2023-06-24 MED ORDER — ASPIRIN-CAFFEINE 500-32.5 MG PO TABS: 1.0000 | ORAL_TABLET | Freq: Four times a day (QID) | ORAL | Status: DC | PRN

## 2023-06-24 MED ORDER — DM-GUAIFENESIN ER 30-600 MG PO TB12
1.0000 | ORAL_TABLET | Freq: Two times a day (BID) | ORAL | Status: DC
  Administered 2023-06-24 – 2023-06-25 (×2): 1 via ORAL
  Filled 2023-06-24 (×2): qty 1

## 2023-06-24 NOTE — Plan of Care (Signed)
 Pt advanced to carb consistent diet. Tolerated meal. Patient ambulates in room with minimal assist to bathroom. Continuous blood glucose monitoring.  VSS.   Problem: Education: Goal: Ability to describe self-care measures that may prevent or decrease complications (Diabetes Survival Skills Education) will improve Outcome: Progressing Goal: Individualized Educational Video(s) Outcome: Progressing   Problem: Coping: Goal: Ability to adjust to condition or change in health will improve Outcome: Progressing   Problem: Fluid Volume: Goal: Ability to maintain a balanced intake and output will improve Outcome: Progressing   Problem: Health Behavior/Discharge Planning: Goal: Ability to identify and utilize available resources and services will improve Outcome: Progressing Goal: Ability to manage health-related needs will improve Outcome: Progressing   Problem: Metabolic: Goal: Ability to maintain appropriate glucose levels will improve Outcome: Progressing   Problem: Nutritional: Goal: Maintenance of adequate nutrition will improve Outcome: Progressing Goal: Progress toward achieving an optimal weight will improve Outcome: Progressing   Problem: Skin Integrity: Goal: Risk for impaired skin integrity will decrease Outcome: Progressing   Problem: Tissue Perfusion: Goal: Adequacy of tissue perfusion will improve Outcome: Progressing   Problem: Education: Goal: Knowledge of General Education information will improve Description: Including pain rating scale, medication(s)/side effects and non-pharmacologic comfort measures Outcome: Progressing   Problem: Health Behavior/Discharge Planning: Goal: Ability to manage health-related needs will improve Outcome: Progressing   Problem: Clinical Measurements: Goal: Ability to maintain clinical measurements within normal limits will improve Outcome: Progressing Goal: Will remain free from infection Outcome: Progressing Goal: Diagnostic  test results will improve Outcome: Progressing Goal: Respiratory complications will improve Outcome: Progressing Goal: Cardiovascular complication will be avoided Outcome: Progressing   Problem: Activity: Goal: Risk for activity intolerance will decrease Outcome: Progressing   Problem: Nutrition: Goal: Adequate nutrition will be maintained Outcome: Progressing   Problem: Coping: Goal: Level of anxiety will decrease Outcome: Progressing   Problem: Elimination: Goal: Will not experience complications related to bowel motility Outcome: Progressing Goal: Will not experience complications related to urinary retention Outcome: Progressing   Problem: Pain Managment: Goal: General experience of comfort will improve and/or be controlled Outcome: Progressing   Problem: Safety: Goal: Ability to remain free from injury will improve Outcome: Progressing   Problem: Skin Integrity: Goal: Risk for impaired skin integrity will decrease Outcome: Progressing

## 2023-06-24 NOTE — ED Notes (Signed)
 Spoke with Gabby on Floor ok to send up patient.

## 2023-06-24 NOTE — H&P (Signed)
 History and Physical    Denise [name] WJX:[id number] DOB: [birth year] DOA: 06/24/2023  PCP: Luba Running, FNP  Patient coming from: home  I have personally briefly reviewed patient's old medical records in [facility]  Chief Complaint: persistent lower GI Bleeding  HPI: Denise [name] is a 53 y.o. female with medical history significant of Blood transfusion w/o dx, arthritis, cerebrovascular accident, chronic diastolic congestive heart failure, hypertension, and type 2 diabetes who presented to the emergency department yesterday with rectal bleeding.  She was offered admission but left against advice.  At home she had near syncopal episode today she got up to use the restroom became lightheaded and lowered herself down to the floor.  EMS was called and she had low blood pressures with systolics in the 90s.  She received 500 cc of IV fluid.  She presented into the emergency department with low blood pressures receiving set IV fluids.  She is not on anticoagulation.  CT scan obtained yesterday showed diverticulosis without diverticulitis.  ED Course: 53 year old female with a past medical history significant for arthritis, diabetes type 2, cerebrovascular accident not anticoagulated, chronic diastolic congestive heart failure, hypertension who presented to the emergency department after declining admission and leaving against advice yesterday.  She is now amenable to admission in the emergency department her hemoglobin was noted to be acceptable at 13 her creatinine is 0.44, fecal occult blood is positive from yesterday.  And CT scan showed diverticulosis without diverticulitis.  Review of Systems: As per HPI otherwise all other systems reviewed and  negative.   Past Medical History:  Diagnosis Date   Abnormal EKG    Arthritis    Atopic dermatitis 05/28/2016   Back pain 07/29/2021   Blood transfusion without reported diagnosis    Borderline glaucoma with ocular  hypertension, 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   Chronic diastolic CHF (congestive heart failure) 07/29/2021   Class 3 obesity with alveolar hypoventilation, serious comorbidity, and body mass index (BMI) of 50.0 to 59.9 in adult 06/12/2020   CVA (cerebrovascular accident) 2017   2017 MRI - chronic left lenticulostriate territory hemorrhage or hemorrhagic infarct, with hemosiderin, and encephalomalacia affecting both the caudate and lentiform nucleus     Decreased mobility 06/15/2021   DOE (dyspnea on exertion) 06/12/2020   Essential hypertension 12/18/2014   Fibroid uterus 12/18/2014   Heart murmur, systolic 06/29/2021   History of alcohol abuse 02/16/2017   History of cocaine use 02/16/2017   History of latent syphilis 02/16/2017   Hyperlipidemia associated with type 2 diabetes mellitus 11/07/2020   Incontinence of urine    Lacunar infarction    MRI - chronic lacunar infarct within the posterior right lentiform nucleus.     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   Low back pain 11/21/2017   Lumbar degenerative disc disease    Major depressive disorder 02/16/2017   Nuclear sclerosis of both eyes 10/20/2022   OSA on CPAP 06/12/2020   Overactive bladder    Pain due to onychomycosis of toenails of both feet 05/26/2020   Pelvic pain in female 12/18/2014   Psychogenic nonepileptic seizure 02/17/2017   PTSD (post-traumatic stress disorder)    Pulmonary emphysema 08/31/2021   QT prolongation 06/29/2021   Right sided weakness    Social anxiety disorder 11/03/2017   Syncope 02/07/2015   TIA (transient ischemic attack)    Tobacco use 04/14/2015   Type 2 diabetes mellitus 10/21/2017   Venous stasis dermatitis of both lower extremities 06/15/2021    Past Surgical History:  Procedure Laterality Date  CESAREAN SECTION     x 2    Social History   Social History Narrative   Not on file     reports that she has been smoking cigarettes. She has never used smokeless tobacco. She reports that she does not  currently use drugs after having used the following drugs: "Crack" cocaine. No history on file for alcohol use. Does not use any alcohol.  Allergies  Allergen Reactions   Gadolinium Derivatives Nausea And Vomiting and Other (See Comments)    Nausea and vomiting was followed by epileptic seizure episode that lasted approximately 5 minutes; pt was unable to verbally communicate during that time; she then came to and was able to speak; was evaluated by Rad and RN; kms    Grapefruit Flavor [Flavoring Agent (Non-Screening)] Other (See Comments)    Drug interaction    Family History  Problem Relation Age of Onset   Stroke Mother    Anxiety disorder Mother    Drug abuse Mother    Alcohol abuse Mother    Depression Mother    Drug abuse Father    Anxiety disorder Father    Heart disease Maternal Grandmother    Colon cancer Neg Hx    Esophageal cancer Neg Hx    Pancreatic cancer Neg Hx    Stomach cancer Neg Hx    Liver disease Neg Hx    Breast cancer Neg Hx     Prior to Admission medications   Medication Sig Start Date End Date Taking? Authorizing Provider  amLODipine  (NORVASC ) 10 MG tablet Take 10 mg by mouth daily. 05/25/23   [provider]  atorvastatin  (LIPITOR) 20 MG tablet TAKE 1 TABLET BY MOUTH EVERY DAY 07/13/22   Chandrasekhar, Mahesh A, MD  [facility] BACK & BODY PAIN EX ST 500-32.5 MG TABS Take 1-2 tablets by mouth every 6 (six) hours as needed (for pain or discomfort).    [provider]  Budeson-Glycopyrrol-Formoterol  (BREZTRI  AEROSPHERE) 160-9-4.8 MCG/ACT AERO Inhale 2 puffs into the lungs in the morning and at bedtime. 02/28/23   Cobb, Mariah Shines, NP  dextromethorphan -guaiFENesin  (MUCINEX  DM) 30-600 MG 12hr tablet Take 1 tablet by mouth 2 (two) times daily. 02/20/23   Akula, Vijaya, MD  furosemide  (LASIX ) 80 MG tablet TAKE 1 TABLET BY MOUTH EVERY DAY Patient taking differently: Take 80 mg by mouth in the morning. 12/30/22   Chandrasekhar, Arneta Beverage A, MD  gabapentin   (NEURONTIN ) 300 MG capsule TAKE 3 CAPSULES BY MOUTH 3 TIMES DAILY 05/09/23   Festus Hubert, Adam R, DO  lisinopril  (ZESTRIL ) 30 MG tablet Take 30 mg by mouth daily. 03/29/23   [provider]  metFORMIN  (GLUCOPHAGE ) 500 MG tablet Take 500 mg by mouth 2 (two) times daily. 05/25/17   [provider]  potassium chloride  SA (KLOR-CON  M) 20 MEQ tablet Take 1 tablet (20 mEq total) by mouth daily. 08/01/21   Leona Rake, MD  senna-docusate (SENOKOT-S) 8.6-50 MG tablet Take 1 tablet by mouth at bedtime as needed for mild constipation. 02/20/23   Feliciana Horn, MD  VENTOLIN  HFA 108 (90 Base) MCG/ACT inhaler inhale 2 PUFFS into THE lungs EVERY 6 HOURS AS NEEDED FOR WHEEZING OR SHORTNESS OF BREATH Patient taking differently: Inhale 2 puffs into the lungs every 6 (six) hours as needed for shortness of breath. 02/15/22   Wilder Handy, MD    Physical Exam:  Constitutional: NAD, calm, comfortable Vitals:   06/24/23 0602 06/24/23 0615 06/24/23 0645 06/24/23 0954  BP:  134/70 132/73 (!) 155/74  Pulse:  79 77 81  Resp:  15  20  Temp:    98.2 F (36.8 C)  TempSrc:    Oral  SpO2: 97% 97% 97% 99%   Eyes: PERRL, lids and conjunctivae normal and pink ENMT: Mucous membranes are dry. Posterior pharynx clear of any exudate or lesions.Normal dentition.  Neck: normal, supple, no masses, no thyromegaly Respiratory: clear to auscultation bilaterally, no wheezing, no crackles. Normal respiratory effort. No accessory muscle use.  Cardiovascular: Regular rate and rhythm, no murmurs / rubs / gallops. No extremity edema. 2+ pedal pulses. No carotid bruits.  Abdomen: no tenderness, no masses palpated. No hepatosplenomegaly. Bowel sounds positive.  Musculoskeletal: no clubbing / cyanosis. No joint deformity upper and lower extremities. Good ROM, no contractures. Normal muscle tone.  Skin: no rashes, lesions, ulcers. No induration Neurologic: CN 2-12 grossly intact. Sensation intact, DTR normal. Strength 5/5 in all 4.   Psychiatric: Normal judgment and insight. Alert and oriented x 3. Normal mood.    Labs on Admission: I have personally reviewed following labs and imaging studies  CBC: Recent Labs  Lab 06/23/23 0331 06/24/23 0556  WBC 11.2* 9.7  NEUTROABS 6.7 6.3  HGB 13.9 13.0  HCT 42.9 39.4  MCV 90.7 89.7  PLT 315 298   Basic Metabolic Panel: Recent Labs  Lab 06/23/23 0331 06/24/23 0556  NA 135 139  K 3.4* 3.9  CL 100 105  CO2 25 22  GLUCOSE 115* 99  BUN 9 7  CREATININE 0.66 0.44  CALCIUM  9.0 8.7*   GFR: Estimated Creatinine Clearance: 114.3 mL/min (by C-G formula based on SCr of 0.44 mg/dL). Liver Function Tests: Recent Labs  Lab 06/23/23 0331  AST 18  ALT 14  ALKPHOS 124  BILITOT 0.6  PROT 7.2  ALBUMIN 3.9    Urine analysis:    Component Value Date/Time   COLORURINE YELLOW 10/24/2022 0927   APPEARANCEUR CLEAR 10/24/2022 0927   LABSPEC 1.011 10/24/2022 0927   PHURINE 6.0 10/24/2022 0927   GLUCOSEU NEGATIVE 10/24/2022 0927   HGBUR NEGATIVE 10/24/2022 0927   BILIRUBINUR NEGATIVE 10/24/2022 0927   KETONESUR NEGATIVE 10/24/2022 0927   PROTEINUR NEGATIVE 10/24/2022 0927   UROBILINOGEN 0.2 11/22/2014 0240   NITRITE NEGATIVE 10/24/2022 0927   LEUKOCYTESUR NEGATIVE 10/24/2022 1610    Radiological Exams on Admission: CT ABDOMEN PELVIS W CONTRAST Result Date: 06/23/2023 CLINICAL DATA:  Abdominal pain.  Rectal bleeding. EXAM: CT ABDOMEN AND PELVIS WITH CONTRAST TECHNIQUE: Multidetector CT imaging of the abdomen and pelvis was performed using the standard protocol following bolus administration of intravenous contrast. RADIATION DOSE REDUCTION: This exam was performed according to the departmental dose-optimization program which includes automated exposure control, adjustment of the mA and/or kV according to patient size and/or use of iterative reconstruction technique. CONTRAST:  OMNIPAQUE  IOHEXOL  300 MG/ML  SOLN COMPARISON:  10/24/2022 FINDINGS: Lower chest: Multiple  new pulmonary nodules are identified in the lung bases including 7 mm right lower lobe nodule on image 10/series 5 in 6 mm left lower lobe nodule on image 7/series 5. Hepatobiliary: No suspicious focal abnormality within the liver parenchyma. There is no evidence for gallstones, gallbladder wall thickening, or pericholecystic fluid. No intrahepatic or extrahepatic biliary dilation. Pancreas: No focal mass lesion. No dilatation of the main duct. No intraparenchymal cyst. No peripancreatic edema. Spleen: No splenomegaly. No suspicious focal mass lesion. Adrenals/Urinary Tract: No adrenal nodule or mass. Kidneys unremarkable. No evidence for hydroureter. The urinary bladder appears normal for the degree of distention. Stomach/Bowel: Stomach  is unremarkable. No gastric wall thickening. No evidence of outlet obstruction. Duodenum is normally positioned as is the ligament of Treitz. No small bowel wall thickening. No small bowel dilatation. The terminal ileum is normal. The appendix is normal. No gross colonic mass. No colonic wall thickening. Diverticular changes are noted in the left colon without evidence of diverticulitis. Vascular/Lymphatic: There is mild atherosclerotic calcification of the abdominal aorta without aneurysm. There is no gastrohepatic or hepatoduodenal ligament lymphadenopathy. No retroperitoneal or mesenteric lymphadenopathy. Bilobed retroperitoneal fluid density structure again identified with dominant component measuring 3.4 cm today compared to 3.3 cm (remeasured) previously. Lesion has no perceptible wall or internal architecture and is homogeneous. No pelvic sidewall lymphadenopathy. Reproductive: Calcified uterine fibroids evident. There is no adnexal mass. Other: No intraperitoneal free fluid. Musculoskeletal: No worrisome lytic or sclerotic osseous abnormality. IMPRESSION: 1. No acute findings in the abdomen or pelvis. Specifically, no findings to explain the patient's history of abdominal  pain and rectal bleeding. 2. Multiple new pulmonary nodules in the lung bases measuring up to 7 mm. Close follow-up warranted and consider chest CT without contrast to further evaluate. 3. Bilobed retroperitoneal fluid density structure again identified with dominant component measuring 3.4 cm today compared to 3.3 cm (remeasured) previously. Lesion has no perceptible wall or internal architecture and is homogeneous. Previous study described this structure is being present since at least 2019. Imaging features most suggestive of benign etiology such as lymphangioma. 4. Left colonic diverticulosis without diverticulitis. 5. Calcified uterine fibroids. 6.  Aortic Atherosclerosis (ICD10-I70.0). Electronically Signed   By: Donnal Fusi M.D.   On: 06/23/2023 05:09    EKG: Independently reviewed.  EKG personally reviewed from 06/23/2023: Sinus rhythm at 77 bpm  Assessment/Plan Principal Problem:   Acute lower GI bleeding Active Problems:   Type 2 diabetes mellitus   Chronic diastolic CHF (congestive heart failure)   Essential hypertension   Class 3 obesity with alveolar hypoventilation, serious comorbidity, and body mass index (BMI) of 50.0 to 59.9 in adult   OSA on CPAP    1.  Acute lower GI bleeding: -Place in overnight observation - Hold aspirin  containing pain reliever - Monitor hemoglobin currently stable will recheck in a.m. and if additional stools occur - GI has been consulted and will see patient and plan possible colonoscopy.  2.  Type 2 diabetes mellitus: - Every 4 hour fingerstick blood glucoses with sliding scale as needed.   - Hold metformin . - Check hemoglobin A1c is not checked since January.  3.  Chronic diastolic congestive heart failure: - Continue amlodipine , furosemide , and lisinopril .   - Maintain blood pressures.  Suspect related to chronic hypertension  4.  Essential hypertension: - Blood pressures currently acceptable - Continue home medication  5.  Class III  obesity with alveolar hypoventilation: - Continue to encourage increased exercise and monitor calorie intake  6.  Obstructive sleep apnea on CPAP: - Will ask patient to provide her home CPAP device  DVT prophylaxis: Not indicated is currently in observation Code Status: Full code Family Communication: Patient retains capacity Disposition Plan: Home after cleared by GI  Consults called: [facility]  Admission status: Observation   Ronny Colas MD FACP [facility] Pager [phone]  How to contact the [facility] Attending or Consulting provider 7A - 7P or covering provider during after hours 7P -7A, for this patient?  Check the care team in [facility] and look for a) attending/consulting [facility] provider listed and b) the [facility] team listed Log into [email or web address]  and use [facility] universal password to access. If you do not have the password, please contact the hospital operator. Locate the [facility] provider you are looking for under [facility] and page to a number that you can be directly reached. If you still have difficulty reaching the provider, please page the [facility] (Director on Call) for the Hospitalists listed on amion for assistance.  If 7PM-7AM, please contact night-coverage [email or web address] Password [email or web address]  06/24/2023, 10:53 AM

## 2023-06-24 NOTE — TOC Initial Note (Signed)
 Transition of Care [facility]) - Initial/Assessment Note    Patient Details  Name: Denise [name] MRN: [id number] Date of Birth: 1970-08-07  [facility]) CM/SW Contact:    Levie Ream, RN Phone Number: 06/24/2023, 3:30 PM  Clinical Narrative:                 Denise [name] w/ pt in room; pt says she lives w/ her husband Godwin Lat at [facility]; he can be contacted at [phone]; pt says she will arrange transportation; she verified insurance/PCP; pt denied SDOH risks; pt says she will arrange transportation; pt has RW; she has home oxygen w/ [employer]; pt says she has her travel concentrator in room; pt says she has an aide, but she does not know the agency; TOC is following.  Expected Discharge Plan: Home w Home Health Services Barriers to Discharge: Continued Medical Work up   Patient Goals and CMS Choice Patient states their goals for this hospitalization and ongoing recovery are:: home          Expected Discharge Plan and Services   Discharge Planning Services: CM Consult   Living arrangements for the past 2 months: Hotel/Motel                                      Prior Living Arrangements/Services Living arrangements for the past 2 months: Hotel/Motel Lives with:: Spouse Patient language and need for interpreter reviewed:: Yes Do you feel safe going back to the place where you live?: Yes      Need for Family Participation in Patient Care: Yes (Comment) Care giver support system in place?: Yes (comment) Current home services: DME (walker, cpap, home oxygen w/ [employer]) Criminal Activity/Legal Involvement Pertinent to Current Situation/Hospitalization: No - Comment as needed  Activities of Daily Living   ADL Screening (condition at time of admission) Independently performs ADLs?: Yes (appropriate for developmental age) Is the patient deaf or have difficulty hearing?: No Does the patient have difficulty seeing, even when wearing  glasses/contacts?: No Does the patient have difficulty concentrating, remembering, or making decisions?: No  Permission Sought/Granted Permission sought to share information with : Case Manager Permission granted to share information with : Yes, Verbal Permission Granted  Share Information with NAME: Case Manager     Permission granted to share info w Relationship: Godwin Lat (spouse) [phone]     Emotional Assessment Appearance:: Appears stated age Attitude/Demeanor/Rapport: Gracious Affect (typically observed): Accepting Orientation: : Oriented to Self, Oriented to Place, Oriented to  Time, Oriented to Situation Alcohol / Substance Use: Not Applicable Psych Involvement: No (comment)  Admission diagnosis:  Rectal bleeding [K62.5] Acute lower GI bleeding [K92.2] Patient Active Problem List   Diagnosis Date Noted   Acute lower GI bleeding 06/24/2023   COPD (chronic obstructive pulmonary disease) (HCC) 04/08/2023   Chronic respiratory failure with hypoxia (HCC) 02/28/2023   Bronchopneumonia 02/18/2023   Centrilobular emphysema (HCC) 02/18/2023   CAP (community acquired pneumonia) 02/18/2023   Community acquired pneumonia 02/17/2023   Incontinence of urine    Lumbar degenerative disc disease    PTSD (post-traumatic stress disorder)    Lacunar infarction    Borderline glaucoma with ocular hypertension, 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   Nuclear sclerosis of both eyes 10/20/2022   Difficulty demonstrating health literacy 08/17/2021   Chronic diastolic CHF (congestive heart failure) 07/29/2021   Back pain 07/29/2021   Heart murmur, systolic 06/29/2021   QT prolongation  06/29/2021   Decreased mobility 06/15/2021   Venous stasis dermatitis of both lower extremities 06/15/2021   Hyperlipidemia associated with type 2 diabetes mellitus 11/07/2020   DOE (dyspnea on exertion) 06/12/2020   OSA on CPAP 06/12/2020   Class 3 obesity with alveolar hypoventilation, serious comorbidity, and  body mass index (BMI) of 50.0 to 59.9 in adult 06/12/2020   Low back pain 11/21/2017   Social anxiety disorder 11/03/2017   Type 2 diabetes mellitus 10/21/2017   Psychogenic nonepileptic seizure 02/17/2017   History of latent syphilis 02/16/2017   Major depressive disorder 02/16/2017   History of alcohol abuse 02/16/2017   History of cocaine use 02/16/2017   Atopic dermatitis 05/28/2016   Tobacco use 04/14/2015   Right sided weakness    CVA (cerebrovascular accident) 2017   Pelvic pain in female 12/18/2014   Fibroid uterus 12/18/2014   Essential hypertension 12/18/2014   PCP:  Luba Running, FNP Pharmacy:   [facility] [place], [place] - [address] [place] [address] Phone: [phone] Fax: [phone]     Social Drivers of Health (SDOH) Social History: SDOH Screenings   Food Insecurity: No Food Insecurity (06/24/2023)  Housing: Low Risk  (06/24/2023)  Transportation Needs: No Transportation Needs (06/24/2023)  Utilities: Not At Risk (06/24/2023)  Depression (PHQ2-9): High Risk (07/15/2022)  Financial Resource Strain: Not on File (05/14/2021)   Received from [place], [place]  Physical Activity: Not on File (05/14/2021)   Received from [place], [place]  Social Connections: Unknown (02/17/2023)  Stress: Not on File (05/14/2021)   Received from [place], [place]  Tobacco Use: High Risk (06/24/2023)   SDOH Interventions: Food Insecurity Interventions: Intervention Not Indicated, Inpatient TOC Housing Interventions: Intervention Not Indicated, Inpatient TOC Transportation Interventions: Intervention Not Indicated, Inpatient TOC Utilities Interventions: Intervention Not Indicated, Inpatient TOC   Readmission Risk Interventions     No data to display

## 2023-06-24 NOTE — ED Triage Notes (Signed)
 Patient arrived with complaints of rectal bleeding over the last two weeks and increased shortness of breath. Orthostatic with EMS. Wears 2 liters O2 at baseline. Given 500 cc ns

## 2023-06-24 NOTE — Plan of Care (Signed)

## 2023-06-24 NOTE — ED Notes (Addendum)
 ED TO INPATIENT HANDOFF REPORT  Name/Age/Gender Denise [name] 53 y.o. female  Code Status Code Status History     Date Active Date Inactive Code Status Order ID Comments User Context   04/08/2023 1451 04/10/2023 1746 Full Code [id number]  Gaylin Ke, MD Inpatient   02/17/2023 1552 02/20/2023 1722 Full Code [id number]  Vita Grip, MD ED   07/28/2021 2310 08/01/2021 1801 Full Code [id number]  Walton Guppy, MD Inpatient   09/25/2019 1622 09/27/2019 1445 Full Code [id number]  Oral Billings, MD ED   02/07/2015 1601 02/09/2015 1542 Full Code [id number]  Nonie Beady, MD ED    Questions for Most Recent Historical Code Status (Order [id number])     Question Answer   By: Consent: discussion documented in EHR            Home/SNF/Other Home  Chief Complaint Acute lower GI bleeding [K92.2]  Level of Care/Admitting Diagnosis ED Disposition     ED Disposition  Admit   Condition  --   Comment  Hospital Area: [facility] [[id number]]  Level of Care: Med-Surg [16]  May place patient in observation at [facility] or [facility] if equivalent level of care is available:: Yes  Covid Evaluation: Asymptomatic - no recent exposure (last 10 days) testing not required  Diagnosis: Acute lower GI bleeding [238177]  Admitting Physician: Ronny Colas [[id number]]  Attending Physician: Ronny Colas [[id number]]          Medical History 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   Chronic diastolic CHF (congestive heart failure) 07/29/2021   Class 3 obesity with alveolar hypoventilation, serious comorbidity, and body mass index (BMI) of 50.0 to 59.9 in adult 06/12/2020   CVA (cerebrovascular accident) 2017   2017 MRI - chronic left lenticulostriate  territory hemorrhage or hemorrhagic infarct, with hemosiderin, and encephalomalacia affecting both the caudate and lentiform nucleus     Decreased mobility 06/15/2021   DOE (dyspnea on exertion) 06/12/2020   Essential hypertension 12/18/2014   Fibroid uterus 12/18/2014   Heart murmur, systolic 06/29/2021   History of alcohol abuse 02/16/2017   History of cocaine use 02/16/2017   History of latent syphilis 02/16/2017   Hyperlipidemia associated with type 2 diabetes mellitus 11/07/2020   Incontinence of urine    Lacunar infarction    MRI - chronic lacunar infarct within the posterior right lentiform nucleus.     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   Low back pain 11/21/2017   Lumbar degenerative disc disease    Major depressive disorder 02/16/2017   Nuclear sclerosis of both eyes 10/20/2022   OSA on CPAP 06/12/2020   Overactive bladder    Pain due to onychomycosis of toenails of both feet 05/26/2020   Pelvic pain in female 12/18/2014   Psychogenic nonepileptic seizure 02/17/2017   PTSD (post-traumatic stress disorder)    Pulmonary emphysema 08/31/2021   QT prolongation 06/29/2021   Right sided weakness    Social anxiety disorder 11/03/2017   Syncope 02/07/2015   TIA (transient ischemic attack)    Tobacco use 04/14/2015   Type 2 diabetes mellitus 10/21/2017   Venous stasis dermatitis of both lower extremities 06/15/2021    Allergies Allergies  Allergen Reactions   Gadolinium Derivatives Nausea And Vomiting and Other (See Comments)  Nausea and vomiting was followed by epileptic seizure episode that lasted approximately 5 minutes; pt was unable to verbally communicate during that time; she then came to and was able to speak; was evaluated by Rad and RN; kms    Grapefruit Flavor [Flavoring Agent (Non-Screening)] Other (See Comments)    Drug interaction    IV Location/Drains/Wounds Patient Lines/Drains/Airways Status     Active Line/Drains/Airways     Name Placement date Placement  time Site Days   Peripheral IV 06/24/23 20 G Right Antecubital 06/24/23  0549  Antecubital  less than 1            Labs/Imaging Results for orders placed or performed during the hospital encounter of 06/24/23 (from the past 48 hours)  CBC with Differential     Status: Abnormal   Collection Time: 06/24/23  5:56 AM  Result Value Ref Range   WBC 9.7 4.0 - 10.5 K/uL   RBC 4.39 3.87 - 5.11 MIL/uL   Hemoglobin 13.0 12.0 - 15.0 g/dL   HCT 16.1 09.6 - 04.5 %   MCV 89.7 80.0 - 100.0 fL   MCH 29.6 26.0 - 34.0 pg   MCHC 33.0 30.0 - 36.0 g/dL   RDW 40.9 81.1 - 91.4 %   Platelets 298 150 - 400 K/uL   nRBC 0.0 0.0 - 0.2 %   Neutrophils Relative % 64 %   Neutro Abs 6.3 1.7 - 7.7 K/uL   Lymphocytes Relative 25 %   Lymphs Abs 2.4 0.7 - 4.0 K/uL   Monocytes Relative 8 %   Monocytes Absolute 0.8 0.1 - 1.0 K/uL   Eosinophils Relative 1 %   Eosinophils Absolute 0.1 0.0 - 0.5 K/uL   Basophils Relative 1 %   Basophils Absolute 0.1 0.0 - 0.1 K/uL   Immature Granulocytes 1 %   Abs Immature Granulocytes 0.08 (H) 0.00 - 0.07 K/uL    Comment: Performed at [facility], [address] [place], [place] [address]  Basic metabolic panel     Status: Abnormal   Collection Time: 06/24/23  5:56 AM  Result Value Ref Range   Sodium 139 135 - 145 mmol/L   Potassium 3.9 3.5 - 5.1 mmol/L   Chloride 105 98 - 111 mmol/L   CO2 22 22 - 32 mmol/L   Glucose, Bld 99 70 - 99 mg/dL    Comment: Glucose reference range applies only to samples taken after fasting for at least 8 hours.   BUN 7 6 - 20 mg/dL   Creatinine, Ser 6.21 0.44 - 1.00 mg/dL   Calcium  8.7 (L) 8.9 - 10.3 mg/dL   GFR, Estimated >30 >86 mL/min    Comment: (NOTE) Calculated using the CKD-EPI Creatinine Equation (2021)    Anion gap 12 5 - 15    Comment: Performed at [facility], [address] [place], [place] [address]   CT ABDOMEN PELVIS W CONTRAST Result Date: 06/23/2023 CLINICAL DATA:  Abdominal pain.   Rectal bleeding. EXAM: CT ABDOMEN AND PELVIS WITH CONTRAST TECHNIQUE: Multidetector CT imaging of the abdomen and pelvis was performed using the standard protocol following bolus administration of intravenous contrast. RADIATION DOSE REDUCTION: This exam was performed according to the departmental dose-optimization program which includes automated exposure control, adjustment of the mA and/or kV according to patient size and/or use of iterative reconstruction technique. CONTRAST:  OMNIPAQUE  IOHEXOL  300 MG/ML  SOLN COMPARISON:  10/24/2022 FINDINGS: Lower chest: Multiple new pulmonary nodules are identified in the lung  bases including 7 mm right lower lobe nodule on image 10/series 5 in 6 mm left lower lobe nodule on image 7/series 5. Hepatobiliary: No suspicious focal abnormality within the liver parenchyma. There is no evidence for gallstones, gallbladder wall thickening, or pericholecystic fluid. No intrahepatic or extrahepatic biliary dilation. Pancreas: No focal mass lesion. No dilatation of the main duct. No intraparenchymal cyst. No peripancreatic edema. Spleen: No splenomegaly. No suspicious focal mass lesion. Adrenals/Urinary Tract: No adrenal nodule or mass. Kidneys unremarkable. No evidence for hydroureter. The urinary bladder appears normal for the degree of distention. Stomach/Bowel: Stomach is unremarkable. No gastric wall thickening. No evidence of outlet obstruction. Duodenum is normally positioned as is the ligament of Treitz. No small bowel wall thickening. No small bowel dilatation. The terminal ileum is normal. The appendix is normal. No gross colonic mass. No colonic wall thickening. Diverticular changes are noted in the left colon without evidence of diverticulitis. Vascular/Lymphatic: There is mild atherosclerotic calcification of the abdominal aorta without aneurysm. There is no gastrohepatic or hepatoduodenal ligament lymphadenopathy. No retroperitoneal or mesenteric lymphadenopathy.  Bilobed retroperitoneal fluid density structure again identified with dominant component measuring 3.4 cm today compared to 3.3 cm (remeasured) previously. Lesion has no perceptible wall or internal architecture and is homogeneous. No pelvic sidewall lymphadenopathy. Reproductive: Calcified uterine fibroids evident. There is no adnexal mass. Other: No intraperitoneal free fluid. Musculoskeletal: No worrisome lytic or sclerotic osseous abnormality. IMPRESSION: 1. No acute findings in the abdomen or pelvis. Specifically, no findings to explain the patient's history of abdominal pain and rectal bleeding. 2. Multiple new pulmonary nodules in the lung bases measuring up to 7 mm. Close follow-up warranted and consider chest CT without contrast to further evaluate. 3. Bilobed retroperitoneal fluid density structure again identified with dominant component measuring 3.4 cm today compared to 3.3 cm (remeasured) previously. Lesion has no perceptible wall or internal architecture and is homogeneous. Previous study described this structure is being present since at least 2019. Imaging features most suggestive of benign etiology such as lymphangioma. 4. Left colonic diverticulosis without diverticulitis. 5. Calcified uterine fibroids. 6.  Aortic Atherosclerosis (ICD10-I70.0). Electronically Signed   By: Donnal Fusi M.D.   On: 06/23/2023 05:09    Pending Labs Unresulted Labs (From admission, onward)    None       Vitals/Pain Today's Vitals   06/24/23 0551 06/24/23 0602 06/24/23 0615 06/24/23 0645  BP:   134/70 132/73  Pulse:   79 77  Resp:   15   Temp:      TempSrc:      SpO2:  97% 97% 97%  PainSc: 10-Worst pain ever       Isolation Precautions No active isolations  Medications Medications - No data to display  Mobility walks with assistance

## 2023-06-24 NOTE — ED Notes (Addendum)
 Pt assisted to [facility], pt has medium BRIGHT red stool. Pt reports pain with sitting down.

## 2023-06-24 NOTE — ED Provider Notes (Signed)
 [place] EMERGENCY DEPARTMENT AT [facility] Provider Note  CSN: [id number] Arrival date & time: 06/24/23 4098  Chief Complaint(s) Rectal Bleeding  HPI Denise [name] is a 53 y.o. female returns for continued rectal bleeding and near syncopal episode at home.  Patient got up to use the restroom this morning, became lightheaded and lowering down to the floor.  EMS called and noted patient had soft blood pressures with systolics in the 90s.  She was given 500 cc of IV fluids.  Patient is not anticoagulated.  Patient was seen yesterday for the rectal bleeding and noted to have diverticulosis on CT scan without diverticulitis.  She was scheduled to be admitted however left AGAINST MEDICAL ADVICE prior to the actual admission.   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   Chronic diastolic CHF (congestive heart failure) 07/29/2021   Class 3 obesity with alveolar hypoventilation, serious comorbidity, and body mass index (BMI) of 50.0 to 59.9 in adult 06/12/2020   CVA (cerebrovascular accident) 2017   2017 MRI - chronic left lenticulostriate territory hemorrhage or hemorrhagic infarct, with hemosiderin, and encephalomalacia affecting both the caudate and lentiform nucleus     Decreased mobility 06/15/2021   DOE (dyspnea on exertion) 06/12/2020   Essential hypertension 12/18/2014   Fibroid uterus 12/18/2014   Heart murmur, systolic 06/29/2021   History of alcohol abuse 02/16/2017   History of cocaine use 02/16/2017   History of latent syphilis 02/16/2017   Hyperlipidemia associated with type 2 diabetes mellitus 11/07/2020   Incontinence of urine    Lacunar infarction    MRI - chronic lacunar infarct within the posterior right  lentiform nucleus.     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   Low back pain 11/21/2017   Lumbar degenerative disc disease    Major depressive disorder 02/16/2017   Nuclear sclerosis of both eyes 10/20/2022   OSA on CPAP 06/12/2020   Overactive bladder    Pain due to onychomycosis of toenails of both feet 05/26/2020   Pelvic pain in female 12/18/2014   Psychogenic nonepileptic seizure 02/17/2017   PTSD (post-traumatic stress disorder)    Pulmonary emphysema 08/31/2021   QT prolongation 06/29/2021   Right sided weakness    Social anxiety disorder 11/03/2017   Syncope 02/07/2015   TIA (transient ischemic attack)    Tobacco use 04/14/2015   Type 2 diabetes mellitus 10/21/2017   Venous stasis dermatitis of both lower extremities 06/15/2021   Patient Active Problem List   Diagnosis Date Noted   COPD (chronic obstructive pulmonary disease) (HCC) 04/08/2023   Chronic respiratory failure with hypoxia (HCC) 02/28/2023   Bronchopneumonia 02/18/2023   Centrilobular emphysema (HCC) 02/18/2023   CAP (community acquired pneumonia) 02/18/2023   Community acquired pneumonia 02/17/2023   Incontinence of urine    Lumbar degenerative disc disease    PTSD (post-traumatic stress disorder)    Lacunar infarction    Borderline glaucoma with ocular hypertension, 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   Nuclear sclerosis of both eyes 10/20/2022   Difficulty demonstrating health literacy 08/17/2021   Chronic diastolic CHF (congestive heart failure) 07/29/2021   Back pain 07/29/2021   Heart murmur, systolic 06/29/2021   QT prolongation 06/29/2021   Decreased mobility 06/15/2021   Venous stasis dermatitis of both lower extremities 06/15/2021  Hyperlipidemia associated with type 2 diabetes mellitus 11/07/2020   DOE (dyspnea on exertion) 06/12/2020   OSA on CPAP 06/12/2020   Class 3 obesity with alveolar hypoventilation, serious comorbidity, and body mass index (BMI) of 50.0 to 59.9 in adult 06/12/2020   Low back pain  11/21/2017   Social anxiety disorder 11/03/2017   Type 2 diabetes mellitus 10/21/2017   Psychogenic nonepileptic seizure 02/17/2017   History of latent syphilis 02/16/2017   Major depressive disorder 02/16/2017   History of alcohol abuse 02/16/2017   History of cocaine use 02/16/2017   Atopic dermatitis 05/28/2016   Tobacco use 04/14/2015   Right sided weakness    CVA (cerebrovascular accident) 2017   Pelvic pain in female 12/18/2014   Fibroid uterus 12/18/2014   Essential hypertension 12/18/2014   Home Medication(s) Prior to Admission medications   Medication Sig Start Date End Date Taking? Authorizing Provider  amLODipine  (NORVASC ) 10 MG tablet Take 10 mg by mouth daily. 05/25/23   [provider]  atorvastatin  (LIPITOR) 20 MG tablet TAKE 1 TABLET BY MOUTH EVERY DAY 07/13/22   Chandrasekhar, Mahesh A, MD  BAYER BACK & BODY PAIN EX ST 500-32.5 MG TABS Take 1-2 tablets by mouth every 6 (six) hours as needed (for pain or discomfort).    [provider]  Budeson-Glycopyrrol-Formoterol  (BREZTRI  AEROSPHERE) 160-9-4.8 MCG/ACT AERO Inhale 2 puffs into the lungs in the morning and at bedtime. 02/28/23   Cobb, Mariah Shines, NP  carbamazepine  (TEGRETOL ) 200 MG tablet Take 2 tablets (400 mg total) by mouth 2 (two) times daily. Patient not taking: Reported on 06/22/2023 06/01/23   Merriam Abbey, DO  COLD & FLU 5-10-200-325 MG CAPS Take 1-2 capsules by mouth daily as needed (for flu symptoms).    [provider]  dextromethorphan -guaiFENesin  (MUCINEX  DM) 30-600 MG 12hr tablet Take 1 tablet by mouth 2 (two) times daily. 02/20/23   Akula, Vijaya, MD  DM-Phenylephrine-Acetaminophen  10-5-325 MG/15ML LIQD Take 15 mLs by mouth every 6 (six) hours as needed (for flu-like symptoms). Patient not taking: Reported on 06/22/2023    [provider]  doxycycline  (VIBRA -TABS) 100 MG tablet Take 1 tablet (100 mg total) by mouth every 12 (twelve) hours. Patient not taking: Reported on  06/22/2023 04/10/23   Haydee Lipa, MD  furosemide  (LASIX ) 80 MG tablet TAKE 1 TABLET BY MOUTH EVERY DAY Patient taking differently: Take 80 mg by mouth in the morning. 12/30/22   Chandrasekhar, Arneta Beverage A, MD  gabapentin  (NEURONTIN ) 300 MG capsule TAKE 3 CAPSULES BY MOUTH 3 TIMES DAILY 05/09/23   Festus Hubert, Adam R, DO  lisinopril  (ZESTRIL ) 30 MG tablet Take 30 mg by mouth daily. 03/29/23   [provider]  metFORMIN  (GLUCOPHAGE ) 500 MG tablet Take 500 mg by mouth 2 (two) times daily. 05/25/17   [provider]  Polyethylene Glycol 3350  (PEG 3350 ) 17 GM/SCOOP POWD Take 17 g by mouth daily as needed (constipation). Patient not taking: Reported on 06/22/2023 11/17/20   [provider]  potassium chloride  SA (KLOR-CON  M) 20 MEQ tablet Take 1 tablet (20 mEq total) by mouth daily. 08/01/21   Leona Rake, MD  senna-docusate (SENOKOT-S) 8.6-50 MG tablet Take 1 tablet by mouth at bedtime as needed for mild constipation. 02/20/23   Akula, Vijaya, MD  Spacer/Aero-Holding Chambers (AEROCHAMBER MV) inhaler Use as instructed Patient not taking: Reported on 06/22/2023 09/21/21   Wilder Handy, MD  VENTOLIN  HFA 108 (90 Base) MCG/ACT inhaler inhale 2 PUFFS into THE lungs EVERY 6 HOURS AS NEEDED  FOR WHEEZING OR SHORTNESS OF BREATH Patient taking differently: Inhale 2 puffs into the lungs every 6 (six) hours as needed for shortness of breath. 02/15/22   Sood, Vineet, MD                                                                                                                                    Allergies Gadolinium derivatives and Grapefruit flavor [flavoring agent (non-screening)]  Review of Systems Review of Systems  Gastrointestinal:  Positive for hematochezia.   As noted in HPI  Physical Exam Vital Signs  I have reviewed the triage vital signs BP 132/73   Pulse 77   Temp 98.3 F (36.8 C) (Oral)   Resp 15   LMP 05/29/2017 (Approximate)   SpO2 97%   Physical Exam Vitals  reviewed.  Constitutional:      General: She is not in acute distress.    Appearance: She is well-developed. She is obese. She is not diaphoretic.  HENT:     Head: Normocephalic and atraumatic.     Nose: Nose normal.  Eyes:     General: No scleral icterus.       Right eye: No discharge.        Left eye: No discharge.     Conjunctiva/sclera: Conjunctivae normal.     Pupils: Pupils are equal, round, and reactive to light.  Cardiovascular:     Rate and Rhythm: Normal rate and regular rhythm.     Heart sounds: No murmur heard.    No friction rub. No gallop.  Pulmonary:     Effort: Pulmonary effort is normal. No respiratory distress.     Breath sounds: Normal breath sounds. No stridor. No rales.  Abdominal:     General: There is no distension.     Palpations: Abdomen is soft.     Tenderness: There is no abdominal tenderness.  Genitourinary:    Comments: Gross red blood in perineum Musculoskeletal:        General: No tenderness.     Cervical back: Normal range of motion and neck supple.  Skin:    General: Skin is warm and dry.     Findings: No erythema or rash.  Neurological:     Mental Status: She is alert and oriented to person, place, and time.     ED Results and Treatments Labs (all labs ordered are listed, but only abnormal results are displayed) Labs Reviewed  CBC WITH DIFFERENTIAL/PLATELET - Abnormal; Notable for the following components:      Result Value   Abs Immature Granulocytes 0.08 (*)    All other components within normal limits  BASIC METABOLIC PANEL WITH GFR - Abnormal; Notable for the following components:   Calcium  8.7 (*)    All other components within normal limits  EKG  EKG Interpretation Date/Time:    Ventricular Rate:    PR Interval:    QRS Duration:    QT Interval:    QTC Calculation:   R Axis:      Text Interpretation:          Radiology No results found.  Medications Ordered in ED Medications - No data to display Procedures Procedures  (including critical care time) Medical Decision Making / ED Course   Medical Decision Making Amount and/or Complexity of Data Reviewed Labs: ordered. Decision-making details documented in ED Course.    Lower GI bleed.  Hemoglobin stable.  Still bleeding.  Will call hospitalist service to readmit patient    Final Clinical Impression(s) / ED Diagnoses Final diagnoses:  Rectal bleeding    This chart was dictated using voice recognition software.  Despite best efforts to proofread,  errors can occur which can change the documentation meaning.    Lindle Rhea, MD 06/24/23 [phone]

## 2023-06-25 ENCOUNTER — Other Ambulatory Visit (HOSPITAL_COMMUNITY): Payer: Self-pay

## 2023-06-25 DIAGNOSIS — K219 Gastro-esophageal reflux disease without esophagitis: Secondary | ICD-10-CM

## 2023-06-25 DIAGNOSIS — K625 Hemorrhage of anus and rectum: Secondary | ICD-10-CM

## 2023-06-25 LAB — URINALYSIS, W/ REFLEX TO CULTURE (INFECTION SUSPECTED)
Bacteria, UA: NONE SEEN
Bilirubin Urine: NEGATIVE
Glucose, UA: NEGATIVE mg/dL
Hgb urine dipstick: NEGATIVE
Ketones, ur: 20 mg/dL — AB
Nitrite: NEGATIVE
Protein, ur: 30 mg/dL — AB
Specific Gravity, Urine: 1.027 (ref 1.005–1.030)
pH: 5 (ref 5.0–8.0)

## 2023-06-25 LAB — CBC WITH DIFFERENTIAL/PLATELET
Abs Immature Granulocytes: 0.04 10*3/uL (ref 0.00–0.07)
Basophils Absolute: 0 10*3/uL (ref 0.0–0.1)
Basophils Relative: 0 %
Eosinophils Absolute: 0.1 10*3/uL (ref 0.0–0.5)
Eosinophils Relative: 1 %
HCT: 40.8 % (ref 36.0–46.0)
Hemoglobin: 13.2 g/dL (ref 12.0–15.0)
Immature Granulocytes: 1 %
Lymphocytes Relative: 33 %
Lymphs Abs: 2.8 10*3/uL (ref 0.7–4.0)
MCH: 29.3 pg (ref 26.0–34.0)
MCHC: 32.4 g/dL (ref 30.0–36.0)
MCV: 90.7 fL (ref 80.0–100.0)
Monocytes Absolute: 0.5 10*3/uL (ref 0.1–1.0)
Monocytes Relative: 6 %
Neutro Abs: 4.9 10*3/uL (ref 1.7–7.7)
Neutrophils Relative %: 59 %
Platelets: 335 10*3/uL (ref 150–400)
RBC: 4.5 MIL/uL (ref 3.87–5.11)
RDW: 14.8 % (ref 11.5–15.5)
WBC: 8.2 10*3/uL (ref 4.0–10.5)
nRBC: 0 % (ref 0.0–0.2)

## 2023-06-25 LAB — HEMOGLOBIN A1C
Hgb A1c MFr Bld: 6.2 % — ABNORMAL HIGH (ref 4.8–5.6)
Mean Plasma Glucose: 131.24 mg/dL

## 2023-06-25 LAB — GLUCOSE, CAPILLARY
Glucose-Capillary: 109 mg/dL — ABNORMAL HIGH (ref 70–99)
Glucose-Capillary: 126 mg/dL — ABNORMAL HIGH (ref 70–99)
Glucose-Capillary: 100 mg/dL — ABNORMAL HIGH (ref 70–99)

## 2023-06-25 MED ORDER — PANTOPRAZOLE SODIUM 40 MG PO TBEC
40.0000 mg | DELAYED_RELEASE_TABLET | Freq: Every day | ORAL | 0 refills | Status: DC
  Filled 2023-06-25: qty 30, 30d supply, fill #0

## 2023-06-25 MED ORDER — PANTOPRAZOLE SODIUM 40 MG IV SOLR: 40.0000 mg | INTRAVENOUS | Status: DC

## 2023-06-25 MED ORDER — PANTOPRAZOLE SODIUM 40 MG PO TBEC
40.0000 mg | DELAYED_RELEASE_TABLET | Freq: Every day | ORAL | Status: DC
  Administered 2023-06-25: 40 mg via ORAL
  Filled 2023-06-25: qty 1

## 2023-06-25 MED ORDER — HYDRALAZINE HCL 20 MG/ML IJ SOLN: 10.0000 mg | INTRAMUSCULAR | Status: DC | PRN

## 2023-06-25 NOTE — Progress Notes (Signed)
 PHARMACIST - PHYSICIAN COMMUNICATION  DR:   Joan Mouton  CONCERNING: IV to Oral Route Change Policy  RECOMMENDATION: This patient is receiving pantoprazole by the intravenous route.  Based on criteria approved by the Pharmacy and Therapeutics Committee, the intravenous medication(s) is/are being converted to the equivalent oral dose form(s).   DESCRIPTION: These criteria include: The patient is eating (either orally or via tube) and/or has been taking other orally administered medications for a least 24 hours The patient has no evidence of active gastrointestinal bleeding or impaired GI absorption (gastrectomy, short bowel, patient on TNA or NPO).  If you have questions about this conversion, please contact the Pharmacy Department  []   [phone] )  [facility] Done []   [phone] )  [facility] []   [phone] )  [facility] Benes []   [phone] )  [facility] [x]   [phone] )  [facility]   [place], [facility] 06/25/2023 12:12 PM

## 2023-06-25 NOTE — Progress Notes (Signed)
 Pt discharged. AVS printed. Reviewed AVS with patient using teach back method. Follow up appointments and medications reviewed. IV removed. Pt verbalized understanding. Pt off unit in wheelchair.

## 2023-06-25 NOTE — Consult Note (Signed)
 Consultation  Referring Provider:  Dr Joan Mouton    Primary Care Physician:  Luba Running, FNP Primary Gastroenterologist:  Dr Dominic Friendly       Reason for Consultation: Rectal bleeding         HPI:   Denise [name] is a 53 y.o. female  With past medical history of DM2, CVA (not on [facility]), dCHF (EF: 60 to 65% 12/2022), HTN, obesity  With painless hematochezia at home.  None since she has been here. Hb stable 13- 14 range.   She came in yesterday and signed out AMA, had another episode of painless hematochezia with near syncope and was brought back to the hospital. Hb has been stable.  She denies having any nausea, vomiting, abdominal pain, fever chills or night sweats.  No recent weight loss.  No nonsteroidals except occasional aspirin .  Minimal heartburn.  CT Abdo/pelvis yesterday was unremarkable except for diverticulosis. (Not CTA)  She wants to eat and would like to have GI workup as an outpatient    Past GI procedures  Colonoscopy 06/2015: Negative except for left colonic diverticulosis.  Good prep. Rpt 10 yrs EGD 09/2016: Gastritis CT AP with contrast this admission IMPRESSION: 1. No acute findings in the abdomen or pelvis. Specifically, no findings to explain the patient's history of abdominal pain and rectal bleeding. 2. Multiple new pulmonary nodules in the lung bases measuring up to 7 mm. Close follow-up warranted and consider chest CT without contrast to further evaluate. 3. Bilobed retroperitoneal fluid density structure again identified with dominant component measuring 3.4 cm today compared to 3.3 cm (remeasured) previously. Lesion has no perceptible wall or internal architecture and is homogeneous. Previous study described this structure is being present since at least 2019. Imaging features most suggestive of benign etiology such as lymphangioma. 4. Left colonic diverticulosis without diverticulitis. 5. Calcified uterine fibroids. 6.  Aortic  Atherosclerosis (ICD10-I70.0).  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   Chronic diastolic CHF (congestive heart failure) 07/29/2021   Class 3 obesity with alveolar hypoventilation, serious comorbidity, and body mass index (BMI) of 50.0 to 59.9 in adult 06/12/2020   CVA (cerebrovascular accident) 2017   2017 MRI - chronic left lenticulostriate territory hemorrhage or hemorrhagic infarct, with hemosiderin, and encephalomalacia affecting both the caudate and lentiform nucleus     Decreased mobility 06/15/2021   DOE (dyspnea on exertion) 06/12/2020   Essential hypertension 12/18/2014   Fibroid uterus 12/18/2014   Heart murmur, systolic 06/29/2021   History of alcohol abuse 02/16/2017   History of cocaine use 02/16/2017   History of latent syphilis 02/16/2017   Hyperlipidemia associated with type 2 diabetes mellitus 11/07/2020   Incontinence of urine    Lacunar infarction    MRI - chronic lacunar infarct within the posterior right lentiform nucleus.     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   Low back pain 11/21/2017   Lumbar degenerative disc disease    Major depressive disorder 02/16/2017   Nuclear sclerosis of both eyes 10/20/2022   OSA on CPAP 06/12/2020   Overactive bladder    Pain due to onychomycosis of toenails of both feet 05/26/2020   Pelvic pain in female 12/18/2014   Psychogenic nonepileptic seizure 02/17/2017   PTSD (post-traumatic stress disorder)    Pulmonary emphysema 08/31/2021  QT prolongation 06/29/2021   Right sided weakness    Social anxiety disorder 11/03/2017   Syncope 02/07/2015   TIA (transient ischemic attack)    Tobacco use 04/14/2015   Type 2 diabetes mellitus 10/21/2017   Venous stasis dermatitis of both lower extremities 06/15/2021    Past Surgical History:   Procedure Laterality Date   CESAREAN SECTION     x 2    Family History  Problem Relation Age of Onset   Stroke Mother    Anxiety disorder Mother    Drug abuse Mother    Alcohol abuse Mother    Depression Mother    Drug abuse Father    Anxiety disorder Father    Heart disease Maternal Grandmother    Colon cancer Neg Hx    Esophageal cancer Neg Hx    Pancreatic cancer Neg Hx    Stomach cancer Neg Hx    Liver disease Neg Hx    Breast cancer Neg Hx      Social History   Tobacco Use   Smoking status: Every Day    Current packs/day: 0.40    Types: Cigarettes   Smokeless tobacco: Never   Tobacco comments:    Smoking 1 pack of cigarettes a week. 09/21/21 Tay    Patients states she is smoking about 8 cigarettes a day now. AB, CMA 12-08-2022     Pt states she is smoking about 3 cigarettes a day now. CC, CMA 06/22/2023  Vaping Use   Vaping status: Never Used  Substance Use Topics   Drug use: Not Currently    Types: "Crack" cocaine    Prior to Admission medications   Medication Sig Start Date End Date Taking? Authorizing Provider  amLODipine  (NORVASC ) 10 MG tablet Take 10 mg by mouth daily. 05/25/23  Yes [provider]  atorvastatin  (LIPITOR) 20 MG tablet TAKE 1 TABLET BY MOUTH EVERY DAY 07/13/22  Yes Chandrasekhar, Mahesh A, MD  [employer] BACK [employer] BODY PAIN EX ST 500-32.5 MG TABS Take 2 tablets by mouth in the morning and at bedtime.   Yes [provider]  Budeson-Glycopyrrol-Formoterol  (BREZTRI  AEROSPHERE) 160-9-4.8 MCG/ACT AERO Inhale 2 puffs into the lungs in the morning and at bedtime. 02/28/23  Yes Cobb, Mariah Shines, NP  carbamazepine  (TEGRETOL ) 200 MG tablet Take 400 mg by mouth in the morning and at bedtime.   Yes [provider]  furosemide  (LASIX ) 80 MG tablet TAKE 1 TABLET BY MOUTH EVERY DAY Patient taking differently: Take 80 mg by mouth in the morning. 12/30/22  Yes Chandrasekhar, Mahesh A, MD  gabapentin  (NEURONTIN ) 300 MG capsule TAKE 3 CAPSULES  BY MOUTH 3 TIMES DAILY 05/09/23  Yes Jaffe, Adam R, DO  lisinopril  (ZESTRIL ) 30 MG tablet Take 30 mg by mouth daily. 03/29/23  Yes [provider]  metFORMIN  (GLUCOPHAGE ) 500 MG tablet Take 500 mg by mouth 2 (two) times daily. 05/25/17  Yes [provider]  potassium chloride  SA (KLOR-CON  M) 20 MEQ tablet Take 1 tablet (20 mEq total) by mouth daily. 08/01/21  Yes Leona Rake, MD  senna-docusate (SENOKOT-S) 8.6-50 MG tablet Take 1 tablet by mouth at bedtime as needed for mild constipation. 02/20/23  Yes Feliciana Horn, MD  VENTOLIN  HFA 108 (90 Base) MCG/ACT inhaler inhale 2 PUFFS into THE lungs EVERY 6 HOURS AS NEEDED FOR WHEEZING OR SHORTNESS OF BREATH Patient taking differently: Inhale 2 puffs into the lungs in the morning and at bedtime. 02/15/22  Yes Wilder Handy, MD    Current  Facility-Administered Medications  Medication Dose Route Frequency Provider Last Rate Last Admin   albuterol  (PROVENTIL ) (2.5 MG/3ML) 0.083% nebulizer solution 2.5 mg  2.5 mg Nebulization Q6H PRN Ronny Colas, MD       amLODipine  (NORVASC ) tablet 10 mg  10 mg Oral Daily Ronny Colas, MD       atorvastatin  (LIPITOR) tablet 20 mg  20 mg Oral Daily Ronny Colas, MD       budesonide -glycopyrrolate -formoterol  (BREZTRI ) 160-9-4.8 MCG/ACT inhaler 2 puff  2 puff Inhalation BID Ronny Colas, MD   2 puff at 06/25/23 7829   carbamazepine  (TEGRETOL ) tablet 400 mg  400 mg Oral BID Ronny Colas, MD   400 mg at 06/24/23 2116   dextromethorphan -guaiFENesin  (MUCINEX  DM) 30-600 MG per 12 hr tablet 1 tablet  1 tablet Oral BID Ronny Colas, MD   1 tablet at 06/24/23 2116   furosemide  (LASIX ) tablet 80 mg  80 mg Oral Daily Ronny Colas, MD       gabapentin  (NEURONTIN ) capsule 900 mg  900 mg Oral TID Ronny Colas, MD   900 mg at 06/24/23 2116   hydrALAZINE (APRESOLINE) injection 10 mg  10 mg Intravenous Q4H PRN Oral Billings, MD       insulin  aspart (novoLOG ) injection 0-15 Units   0-15 Units Subcutaneous Q4H Ronny Colas, MD   2 Units at 06/25/23 5621   lisinopril  (ZESTRIL ) tablet 30 mg  30 mg Oral Daily Ronny Colas, MD       pantoprazole (PROTONIX) injection 40 mg  40 mg Intravenous Q24H Ronny Colas, MD       potassium chloride  SA (KLOR-CON  M) CR tablet 20 mEq  20 mEq Oral Daily Ronny Colas, MD       senna-docusate (Senokot-S) tablet 1 tablet  1 tablet Oral QHS PRN Ronny Colas, MD        Allergies as of 06/24/2023 - Review Complete 06/24/2023  Allergen Reaction Noted   Gadolinium derivatives Nausea And Vomiting and Other (See Comments) 11/05/2019   Grapefruit flavor [flavoring agent (non-screening)] Other (See Comments) 07/26/2017     Review of Systems:    As per HPI, otherwise negative    Physical Exam:  Vital signs in last 24 hours: Temp:  [98.4 F (36.9 C)-98.9 F (37.2 C)] 98.4 F (36.9 C) (05/30 2152) Pulse Rate:  [63-74] 63 (05/31 0355) Resp:  [16-20] 20 (05/31 0355) BP: (128-159)/(68-95) 159/80 (05/31 0355) SpO2:  [95 %-100 %] 100 % (05/31 0355) Last BM Date : 06/24/23 Gen: awake, alert, NAD HEENT: anicteric, no pallor CV: RRR, no mrg Pulm: CTA b/l Abd: soft, NT/ND, +BS throughout Ext: no c/c/e Neuro: nonfocal   LAB RESULTS: Recent Labs    06/23/23 0331 06/24/23 0556  WBC 11.2* 9.7  HGB 13.9 13.0  HCT 42.9 39.4  PLT 315 298   BMET Recent Labs    06/23/23 0331 06/24/23 0556  NA 135 139  K 3.4* 3.9  CL 100 105  CO2 25 22  GLUCOSE 115* 99  BUN 9 7  CREATININE 0.66 0.44  CALCIUM  9.0 8.7*   LFT Recent Labs    06/23/23 0331  PROT 7.2  ALBUMIN 3.9  AST 18  ALT 14  ALKPHOS 124  BILITOT 0.6   PT/INR No results for input(s): "LABPROT", "INR" in the last 72 hours.  STUDIES: No results found.     Impression / Plan:   Assessment  Likely diverticular bleed. HD  stable. Hb stable 13-14 range. Neg CT. Prev Colon 06/2015 was negative except for left colonic diverticulosis.  This has  resolved. GERD.  Doubt UGI bleed. Nl BUN  Plan: - Stat CBC today - Ok to advance diet - Patient would like to have GI workup as outpt - Please start omeprazole 20 mg p.o. daily - Avoid NSAIDs. - FU GI as outpt - D/W Dr Alejandro Amour, MD [facility] [phone]    LOS: 0 days   Lajuan Pila  06/25/2023, 10:44 AM

## 2023-06-25 NOTE — Discharge Summary (Signed)
 Physician Discharge Summary   Patient: Denise [name] MRN: [id number] DOB: 12-23-1970  Admit date:     06/24/2023  Discharge date: 06/25/23  Discharge Physician: Cherylle Corwin   PCP: Luba Running, FNP   Recommendations at discharge:    Follow up with PCP in 1-2 weeks Follow up with GI as scheduled Recommend recheck cbc in 1 week  Discharge Diagnoses: Principal Problem:   Acute lower GI bleeding Active Problems:   Type 2 diabetes mellitus   Chronic diastolic CHF (congestive heart failure)   Essential hypertension   Class 3 obesity with alveolar hypoventilation, serious comorbidity, and body mass index (BMI) of 50.0 to 59.9 in adult   OSA on CPAP  Resolved Problems:   * No resolved hospital problems. *  Hospital Course: 53 y.o. female with medical history significant of Blood transfusion w/o dx, arthritis, cerebrovascular accident, chronic diastolic congestive heart failure, hypertension, and type 2 diabetes who presented to the emergency department yesterday with rectal bleeding.  She was offered admission but left against advice.  At home she had near syncopal episode today she got up to use the restroom became lightheaded and lowered herself down to the floor.  EMS was called and she had low blood pressures with systolics in the 90s.  She received 500 cc of IV fluid.  She presented into the emergency department with low blood pressures receiving set IV fluids.  She is not on anticoagulation.  CT scan obtained yesterday showed diverticulosis without diverticulitis.   ED Course: 53 year old female with a past medical history significant for arthritis, diabetes type 2, cerebrovascular accident not anticoagulated, chronic diastolic congestive heart failure, hypertension who presented to the emergency department after declining admission and leaving against advice yesterday.  She is now amenable to admission in the emergency department her hemoglobin was noted to be acceptable  at 13 her creatinine is 0.44, fecal occult blood is positive from yesterday.  And CT scan showed diverticulosis without diverticulitis.  Assessment and Plan: 1.  Acute lower GI bleeding: - Hgb has remained stable this visit - GI consulted. Pt was seen together with GI -GI recs to avoid NSAIDs, continue PPI and f/u with GI as outpatient   2.  Type 2 diabetes mellitus: - SSI while in hospital -Cont home regimen on d/c   3.  Chronic diastolic congestive heart failure: - Continue amlodipine , furosemide , and lisinopril .     4.  Essential hypertension: - Cont home meds on d/c  5.  Class III obesity with alveolar hypoventilation: - recommend diet/lifestyle modification   6.  Obstructive sleep apnea on CPAP: - Will ask patient to provide her home CPAP device       Consultants: GI Procedures performed:   Disposition: Home Diet recommendation:  Carb modified diet DISCHARGE MEDICATION: Allergies as of 06/25/2023       Reactions   Gadolinium Derivatives Nausea And Vomiting, Other (See Comments)   Nausea and vomiting was followed by epileptic seizure episode that lasted approximately 5 minutes; pt was unable to verbally communicate during that time; she then came to and was able to speak; was evaluated by Rad and RN; kms    Grapefruit Flavor [flavoring Agent (non-screening)] Other (See Comments)   Drug interaction        Medication List     TAKE these medications    amLODipine  10 MG tablet Commonly known as: NORVASC  Take 10 mg by mouth daily.   atorvastatin  20 MG tablet Commonly known as: LIPITOR TAKE 1 TABLET  BY MOUTH EVERY DAY   [employer] Back & Body Pain Ex St 500-32.5 MG Tabs Generic drug: Aspirin -Caffeine  Take 2 tablets by mouth in the morning and at bedtime.   Breztri  Aerosphere 160-9-4.8 MCG/ACT Aero inhaler Generic drug: budesonide -glycopyrrolate -formoterol  Inhale 2 puffs into the lungs in the morning and at bedtime.   carbamazepine  200 MG tablet Commonly known  as: TEGRETOL  Take 400 mg by mouth in the morning and at bedtime.   furosemide  80 MG tablet Commonly known as: LASIX  TAKE 1 TABLET BY MOUTH EVERY DAY What changed: when to take this   gabapentin  300 MG capsule Commonly known as: NEURONTIN  TAKE 3 CAPSULES BY MOUTH 3 TIMES DAILY   lisinopril  30 MG tablet Commonly known as: ZESTRIL  Take 30 mg by mouth daily.   metFORMIN  500 MG tablet Commonly known as: GLUCOPHAGE  Take 500 mg by mouth 2 (two) times daily.   pantoprazole 40 MG tablet Commonly known as: PROTONIX Take 1 tablet (40 mg total) by mouth daily. Start taking on: June 26, 2023   potassium chloride  SA 20 MEQ tablet Commonly known as: KLOR-CON  M Take 1 tablet (20 mEq total) by mouth daily.   senna-docusate 8.6-50 MG tablet Commonly known as: Senokot-S Take 1 tablet by mouth at bedtime as needed for mild constipation.   Ventolin  HFA 108 (90 Base) MCG/ACT inhaler Generic drug: albuterol  inhale 2 PUFFS into THE lungs EVERY 6 HOURS AS NEEDED FOR WHEEZING OR SHORTNESS OF BREATH What changed: when to take this        Follow-up Information     [facility] Follow up.   Contact information: [address] [place] [address] [phone]         Luba Running, FNP Follow up in 2 week(s).   Specialty: Family Medicine Why: Hospital follow up Contact information: [address] [place] [address] [phone]         Lajuan Pila, MD Follow up.   Specialties: Gastroenterology, Internal Medicine Why: as will be scheduled Contact information: [address] [place] [address] [phone]                Discharge Exam: There were no vitals filed for this visit. General exam: Awake, laying in bed, in nad Respiratory system: Normal respiratory effort, no wheezing Cardiovascular system: regular rate, s1, s2 Gastrointestinal system: Soft, nondistended, positive BS Central nervous system: CN2-12 grossly intact, strength  intact Extremities: Perfused, no clubbing Skin: Normal skin turgor, no notable skin lesions seen Psychiatry: Mood normal // no visual hallucinations   Condition at discharge: fair  The results of significant diagnostics from this hospitalization (including imaging, microbiology, ancillary and laboratory) are listed below for reference.   Imaging Studies: CT ABDOMEN PELVIS W CONTRAST Result Date: 06/23/2023 CLINICAL DATA:  Abdominal pain.  Rectal bleeding. EXAM: CT ABDOMEN AND PELVIS WITH CONTRAST TECHNIQUE: Multidetector CT imaging of the abdomen and pelvis was performed using the standard protocol following bolus administration of intravenous contrast. RADIATION DOSE REDUCTION: This exam was performed according to the departmental dose-optimization program which includes automated exposure control, adjustment of the mA and/or kV according to patient size and/or use of iterative reconstruction technique. CONTRAST:  OMNIPAQUE  IOHEXOL  300 MG/ML  SOLN COMPARISON:  10/24/2022 FINDINGS: Lower chest: Multiple new pulmonary nodules are identified in the lung bases including 7 mm right lower lobe nodule on image 10/series 5 in 6 mm left lower lobe nodule on image 7/series 5. Hepatobiliary: No suspicious focal abnormality within the liver parenchyma.  There is no evidence for gallstones, gallbladder wall thickening, or pericholecystic fluid. No intrahepatic or extrahepatic biliary dilation. Pancreas: No focal mass lesion. No dilatation of the main duct. No intraparenchymal cyst. No peripancreatic edema. Spleen: No splenomegaly. No suspicious focal mass lesion. Adrenals/Urinary Tract: No adrenal nodule or mass. Kidneys unremarkable. No evidence for hydroureter. The urinary bladder appears normal for the degree of distention. Stomach/Bowel: Stomach is unremarkable. No gastric wall thickening. No evidence of outlet obstruction. Duodenum is normally positioned as is the ligament of Treitz. No small bowel wall  thickening. No small bowel dilatation. The terminal ileum is normal. The appendix is normal. No gross colonic mass. No colonic wall thickening. Diverticular changes are noted in the left colon without evidence of diverticulitis. Vascular/Lymphatic: There is mild atherosclerotic calcification of the abdominal aorta without aneurysm. There is no gastrohepatic or hepatoduodenal ligament lymphadenopathy. No retroperitoneal or mesenteric lymphadenopathy. Bilobed retroperitoneal fluid density structure again identified with dominant component measuring 3.4 cm today compared to 3.3 cm (remeasured) previously. Lesion has no perceptible wall or internal architecture and is homogeneous. No pelvic sidewall lymphadenopathy. Reproductive: Calcified uterine fibroids evident. There is no adnexal mass. Other: No intraperitoneal free fluid. Musculoskeletal: No worrisome lytic or sclerotic osseous abnormality. IMPRESSION: 1. No acute findings in the abdomen or pelvis. Specifically, no findings to explain the patient's history of abdominal pain and rectal bleeding. 2. Multiple new pulmonary nodules in the lung bases measuring up to 7 mm. Close follow-up warranted and consider chest CT without contrast to further evaluate. 3. Bilobed retroperitoneal fluid density structure again identified with dominant component measuring 3.4 cm today compared to 3.3 cm (remeasured) previously. Lesion has no perceptible wall or internal architecture and is homogeneous. Previous study described this structure is being present since at least 2019. Imaging features most suggestive of benign etiology such as lymphangioma. 4. Left colonic diverticulosis without diverticulitis. 5. Calcified uterine fibroids. 6.  Aortic Atherosclerosis (ICD10-I70.0). Electronically Signed   By: Donnal Fusi M.D.   On: 06/23/2023 05:09    Microbiology: Results for orders placed or performed during the hospital encounter of 04/08/23  Resp panel by RT-PCR (RSV, Flu A&B,  Covid) Anterior Nasal Swab     Status: None   Collection Time: 04/08/23  7:34 AM   Specimen: Anterior Nasal Swab  Result Value Ref Range Status   SARS Coronavirus 2 by RT PCR NEGATIVE NEGATIVE Final    Comment: (NOTE) SARS-CoV-2 target nucleic acids are NOT DETECTED.  The SARS-CoV-2 RNA is generally detectable in upper respiratory specimens during the acute phase of infection. The lowest concentration of SARS-CoV-2 viral copies this assay can detect is 138 copies/mL. A negative result does not preclude SARS-Cov-2 infection and should not be used as the sole basis for treatment or other patient management decisions. A negative result may occur with  improper specimen collection/handling, submission of specimen other than nasopharyngeal swab, presence of viral mutation(s) within the areas targeted by this assay, and inadequate number of viral copies(<138 copies/mL). A negative result must be combined with clinical observations, patient history, and epidemiological information. The expected result is Negative.  Fact Sheet for Patients:  BloggerCourse.com  Fact Sheet for Healthcare Providers:  SeriousBroker.it  This test is no t yet approved or cleared by the [place]  FDA and  has been authorized for detection and/or diagnosis of SARS-CoV-2 by FDA under an Emergency Use Authorization (EUA). This EUA will remain  in effect (meaning this test can be used) for the duration of the COVID-19 declaration  under Section 564(b)(1) of the Act, 21 U.S.C.section 360bbb-3(b)(1), unless the authorization is terminated  or revoked sooner.       Influenza A by PCR NEGATIVE NEGATIVE Final   Influenza B by PCR NEGATIVE NEGATIVE Final    Comment: (NOTE) The Xpert Xpress SARS-CoV-2/FLU/RSV plus assay is intended as an aid in the diagnosis of influenza from Nasopharyngeal swab specimens and should not be used as a sole basis for treatment. Nasal  washings and aspirates are unacceptable for Xpert Xpress SARS-CoV-2/FLU/RSV testing.  Fact Sheet for Patients: BloggerCourse.com  Fact Sheet for Healthcare Providers: SeriousBroker.it  This test is not yet approved or cleared by the [place]  FDA and has been authorized for detection and/or diagnosis of SARS-CoV-2 by FDA under an Emergency Use Authorization (EUA). This EUA will remain in effect (meaning this test can be used) for the duration of the COVID-19 declaration under Section 564(b)(1) of the Act, 21 U.S.C. section 360bbb-3(b)(1), unless the authorization is terminated or revoked.     Resp Syncytial Virus by PCR NEGATIVE NEGATIVE Final    Comment: (NOTE) Fact Sheet for Patients: BloggerCourse.com  Fact Sheet for Healthcare Providers: SeriousBroker.it  This test is not yet approved or cleared by the [place]  FDA and has been authorized for detection and/or diagnosis of SARS-CoV-2 by FDA under an Emergency Use Authorization (EUA). This EUA will remain in effect (meaning this test can be used) for the duration of the COVID-19 declaration under Section 564(b)(1) of the Act, 21 U.S.C. section 360bbb-3(b)(1), unless the authorization is terminated or revoked.  Performed at [facility], [address] [place], [place] [address]     Labs: CBC: Recent Labs  Lab 06/23/23 0331 06/24/23 0556 06/25/23 1115  WBC 11.2* 9.7 8.2  NEUTROABS 6.7 6.3 4.9  HGB 13.9 13.0 13.2  HCT 42.9 39.4 40.8  MCV 90.7 89.7 90.7  PLT 315 298 335   Basic Metabolic Panel: Recent Labs  Lab 06/23/23 0331 06/24/23 0556  NA 135 139  K 3.4* 3.9  CL 100 105  CO2 25 22  GLUCOSE 115* 99  BUN 9 7  CREATININE 0.66 0.44  CALCIUM  9.0 8.7*   Liver Function Tests: Recent Labs  Lab 06/23/23 0331  AST 18  ALT 14  ALKPHOS 124  BILITOT 0.6  PROT 7.2  ALBUMIN 3.9    CBG: Recent Labs  Lab 06/24/23 1931 06/24/23 2350 06/25/23 0356 06/25/23 0815 06/25/23 1115  GLUCAP 139* 129* 109* 126* 100*    Discharge time spent: less than 30 minutes.  Signed: Cherylle Corwin, MD [facility] 06/25/2023

## 2023-06-25 NOTE — Hospital Course (Signed)
 53 y.o. female with medical history significant of Blood transfusion w/o dx, arthritis, cerebrovascular accident, chronic diastolic congestive heart failure, hypertension, and type 2 diabetes who presented to the emergency department yesterday with rectal bleeding.  She was offered admission but left against advice.  At home she had near syncopal episode today she got up to use the restroom became lightheaded and lowered herself down to the floor.  EMS was called and she had low blood pressures with systolics in the 90s.  She received 500 cc of IV fluid.  She presented into the emergency department with low blood pressures receiving set IV fluids.  She is not on anticoagulation.  CT scan obtained yesterday showed diverticulosis without diverticulitis.   ED Course: 54 year old female with a past medical history significant for arthritis, diabetes type 2, cerebrovascular accident not anticoagulated, chronic diastolic congestive heart failure, hypertension who presented to the emergency department after declining admission and leaving against advice yesterday.  She is now amenable to admission in the emergency department her hemoglobin was noted to be acceptable at 13 her creatinine is 0.44, fecal occult blood is positive from yesterday.  And CT scan showed diverticulosis without diverticulitis.

## 2023-07-04 ENCOUNTER — Other Ambulatory Visit (HOSPITAL_COMMUNITY): Payer: Self-pay

## 2023-07-20 ENCOUNTER — Other Ambulatory Visit: Payer: Self-pay | Admitting: Internal Medicine

## 2023-07-21 ENCOUNTER — Encounter: Payer: Self-pay | Admitting: Podiatry

## 2023-07-21 ENCOUNTER — Ambulatory Visit (INDEPENDENT_AMBULATORY_CARE_PROVIDER_SITE_OTHER): Payer: Medicaid Other | Admitting: Podiatry

## 2023-07-21 DIAGNOSIS — E785 Hyperlipidemia, unspecified: Secondary | ICD-10-CM

## 2023-07-21 DIAGNOSIS — M79675 Pain in left toe(s): Secondary | ICD-10-CM | POA: Diagnosis not present

## 2023-07-21 DIAGNOSIS — M79674 Pain in right toe(s): Secondary | ICD-10-CM

## 2023-07-21 DIAGNOSIS — B351 Tinea unguium: Secondary | ICD-10-CM

## 2023-07-21 DIAGNOSIS — E1169 Type 2 diabetes mellitus with other specified complication: Secondary | ICD-10-CM

## 2023-07-21 NOTE — Progress Notes (Signed)
 This patient presents to the office with chief complaint of long thick nails     This patient says there are long thick painful nails.  These nails are painful walking and wearing shoes.   Patient is unable to  self treat her own nails . Patient has been diagnosed with diabetes and claudication. This patient presents  to the office today for treatment of the  long nails.  General Appearance  Alert, conversant and in no acute stress.  Vascular  Dorsalis pedis and posterior tibial  pulses are weakly  palpable  bilaterally.  Capillary return is within normal limits  bilaterally. Temperature is within normal limits  bilaterally.  Neurologic  Senn-Weinstein monofilament wire test within normal limits/diminished  B/L.  Muscle power within normal limits bilaterally.  Nails Thick disfigured discolored nails with subungual debris  from hallux to fifth toes bilaterally. No evidence of bacterial infection or drainage bilaterally.  Orthopedic  No limitations of motion of motion feet .  No crepitus or effusions noted.  No bony pathology or digital deformities noted.  Skin  Dry skin with no porokeratosis noted bilaterally.  No signs of infections or ulcers noted.     Onychomycosis    Debride nails  with nail nipper  10.    RTC 4   months.     Helane Gunther DPM

## 2023-08-08 ENCOUNTER — Ambulatory Visit: Attending: Internal Medicine | Admitting: Internal Medicine

## 2023-08-08 NOTE — Progress Notes (Deleted)
 Cardiology Office Note:  .    Date:  08/08/2023  ID:  Denise [name], DOB [birth year], MRN [id number] PCP: Duwaine Annabella SAILOR, FNP  [facility] Providers Cardiologist:  Stanly DELENA Leavens, MD { Click to update primary MD,subspecialty MD or APP then REFRESH:1}    CC: Hospital Follow up   History of Present Illness: Denise [name]    Denise [name] is a 53 y.o. female with hx of HFpEF HTN with DM, and morbid obesity who had GI bleed 5/25 as is seen in follow up.  Discussed the use of AI scribe software for clinical note transcription with the patient, who gave verbal consent to proceed.   Relevant histories: .  Social  - lives with husband (has medical problems), originally from [place], feels depressed, Son has come up from [facility]  2022: Had negative stress test in 2022 and had increased her diuretic regimen.  Normal BNP 2023: Normal Duplex and ABIs, had atypical CP at April 2023 PCP visit. 2024: Precontemplative of smoking cessation ROS: As per HPI   Studies Reviewed: .     Cardiac Studies & Procedures   ______________________________________________________________________________________________   STRESS TESTS  MYOCARDIAL PERFUSION IMAGING 07/22/2020  Interpretation Summary  Nuclear stress EF: 58%. The left ventricular ejection fraction is normal (55-65%).  There was no ST segment deviation noted during stress.  This is a low risk study. There is no evidence of ischemia and no evidence of previous infarction.  The study is normal.   ECHOCARDIOGRAM  ECHOCARDIOGRAM COMPLETE 01/18/2023  Narrative ECHOCARDIOGRAM REPORT    Patient Name:   Denise [name] Date of Exam: 01/18/2023 Medical Rec #:  [id number]               Height:       65.0 in Accession #:    [id number]              Weight:       311.4 lb Date of Birth:  14-Jun-1970               BSA:          2.388 m Patient Age:    52 years                BP:           133/78 mmHg Patient  Gender: F                       HR:           80 bpm. Exam Location:  [address]  Procedure: 2D Echo, Cardiac Doppler, Color Doppler, 3D Echo and Strain Analysis  Indications:    Dyspnea R06.00; ; R06.9 DOE  History:        Patient has prior history of Echocardiogram examinations, most recent 07/17/2021. CHF; Risk Factors:Dyslipidemia and Diabetes.  Sonographer:    Augustin Seals RDCS Referring Phys: [id number] KATHERINE V COBB  IMPRESSIONS   1. Left ventricular ejection fraction, by estimation, is 60 to 65%. The left ventricle has normal function. The left ventricle has no regional wall motion abnormalities. Left ventricular diastolic parameters were normal. The average left ventricular global longitudinal strain is -22.2 %. The global longitudinal strain is normal. 2. Right ventricular systolic function is normal. The right ventricular size is normal. Tricuspid regurgitation signal is inadequate for assessing PA pressure. 3. The mitral valve is normal in structure. No evidence of mitral valve regurgitation. No evidence of mitral stenosis. 4. The aortic  valve is tricuspid. Aortic valve regurgitation is trivial. Aortic valve sclerosis is present, with no evidence of aortic valve stenosis. 5. The inferior vena cava is normal in size with greater than 50% respiratory variability, suggesting right atrial pressure of 3 mmHg.  Comparison(s): No significant change from prior study.  FINDINGS Left Ventricle: Left ventricular ejection fraction, by estimation, is 60 to 65%. The left ventricle has normal function. The left ventricle has no regional wall motion abnormalities. The average left ventricular global longitudinal strain is -22.2 %. The global longitudinal strain is normal. The left ventricular internal cavity size was normal in size. There is no left ventricular hypertrophy. Left ventricular diastolic parameters were normal.  Right Ventricle: The right ventricular size is normal. Right  ventricular systolic function is normal. Tricuspid regurgitation signal is inadequate for assessing PA pressure. The tricuspid regurgitant velocity is 3.49 m/s, and with an assumed right atrial pressure of 3 mmHg, the estimated right ventricular systolic pressure is 51.7 mmHg.  Left Atrium: Left atrial size was normal in size.  Right Atrium: Right atrial size was normal in size.  Pericardium: There is no evidence of pericardial effusion.  Mitral Valve: The mitral valve is normal in structure. No evidence of mitral valve regurgitation. No evidence of mitral valve stenosis.  Tricuspid Valve: The tricuspid valve is normal in structure. Tricuspid valve regurgitation is trivial. No evidence of tricuspid stenosis.  Aortic Valve: The aortic valve is tricuspid. Aortic valve regurgitation is trivial. Aortic regurgitation PHT measures 367 msec. Aortic valve sclerosis is present, with no evidence of aortic valve stenosis.  Pulmonic Valve: The pulmonic valve was normal in structure. Pulmonic valve regurgitation is not visualized. No evidence of pulmonic stenosis.  Aorta: The aortic root is normal in size and structure.  Venous: The inferior vena cava is normal in size with greater than 50% respiratory variability, suggesting right atrial pressure of 3 mmHg.  IAS/Shunts: No atrial level shunt detected by color flow Doppler.   LEFT VENTRICLE PLAX 2D LVIDd:         4.60 cm   Diastology LVIDs:         3.00 cm   LV e' medial:    8.50 cm/s LV PW:         1.00 cm   LV E/e' medial:  12.8 LV IVS:        0.80 cm   LV e' lateral:   9.46 cm/s LVOT diam:     2.10 cm   LV E/e' lateral: 11.5 LV SV:         90 LV SV Index:   38        2D Longitudinal Strain LVOT Area:     3.46 cm  2D Strain GLS Avg:     -22.2 %  3D Volume EF: 3D EF:        66 % LV EDV:       127 ml LV ESV:       44 ml LV SV:        84 ml  RIGHT VENTRICLE             IVC RV Basal diam:  3.10 cm     IVC diam: 2.10 cm RV Mid diam:     2.60 cm RV S prime:     14.00 cm/s  LEFT ATRIUM             Index        RIGHT ATRIUM  Index LA diam:        4.10 cm 1.72 cm/m   RA Area:     12.80 cm LA Vol (A2C):   38.3 ml 16.04 ml/m  RA Volume:   23.30 ml  9.76 ml/m LA Vol (A4C):   27.1 ml 11.35 ml/m LA Biplane Vol: 32.4 ml 13.57 ml/m AORTIC VALVE LVOT Vmax:   125.00 cm/s LVOT Vmean:  83.400 cm/s LVOT VTI:    0.260 m AI PHT:      367 msec  AORTA Ao Root diam: 3.30 cm Ao Asc diam:  3.60 cm  MITRAL VALVE                TRICUSPID VALVE MV Area (PHT): 3.62 cm     TR Peak grad:   48.7 mmHg MV Decel Time: 210 msec     TR Vmax:        349.00 cm/s MV E velocity: 108.50 cm/s MV A velocity: 128.00 cm/s  SHUNTS MV E/A ratio:  0.85         Systemic VTI:  0.26 m Systemic Diam: 2.10 cm  Redell Shallow MD Electronically signed by Redell Shallow MD Signature Date/Time: 01/18/2023/11:00:48 AM    Final          ______________________________________________________________________________________________        Risk Assessment/Calculations:    {Does this patient have ATRIAL FIBRILLATION?:[phone]}  {This patient may be at risk for Amyloid. She has one or more dx on the prob list or PMH from the following list -  Abnormal EKG, HFpEF/Diastolic CHF, Aortic Stenosis, LVH, Bilateral Carpal Tunnel Syndrome, Biceps Tendon Rupture, Spinal Stenosis, Pericardial Effusion, Left Atrial Enlargement, Conduction System Disorder. See list below or review PMH.  Diagnoses From Problem List           Noted     Chronic diastolic CHF (congestive heart failure) 07/29/2021     QT prolongation 06/29/2021    Click HERE to open Cardiac Amyloid Screening SmartSet to order screening OR Click HERE to defer testing for 1 year or permanently :1}    Physical Exam:    VS:  LMP 05/29/2017 (Approximate)    Wt Readings from Last 3 Encounters:  06/22/23 (!) 308 lb (139.7 kg)  06/01/23 (!) 308 lb (139.7 kg)  04/20/23 (!) 314 lb (142.4  kg)    Gen: *** distress, *** obese/well nourished/malnourished   Neck: No JVD, *** carotid bruit Ears: *** Frank Sign Cardiac: No Rubs or Gallops, *** Murmur, ***cardia, *** radial pulses Respiratory: Clear to auscultation bilaterally, *** effort, ***  respiratory rate GI: Soft, nontender, non-distended *** MS: No *** edema; *** moves all extremities Integument: Skin feels *** Neuro:  At time of evaluation, alert and oriented to person/place/time/situation *** Psych: Normal affect, patient feels ***   ASSESSMENT AND PLAN: .    *** An EKG was ordered for *** and shows ***  HFpEF and new systolic heart murmur Morbid Obesity HTN with DM Tobacco Abuse with seizure history HLD with DM OSA on CPAP and OHS Non syndromic Qtc prolongation  Non vascular Leg pain - continue norvasc  10 mg PO Daily, lisinopril  40 mg (patient takes it 20 mg PO BID) - continue atorvastatin  20 mg PO Daily; LDL now < 100 (84 in 2023) - lasix  80 mg PO daily - BNP and BMP, if elevated will add SGLT2i - discussed smoking cessation that she could take her husband - discussed [email or web address] - Most of her chest pain appears with transferring, if she has  chest pain with her new exercise class, I have consented her for PET MPI   One year me or APP  Stanly Leavens, MD FASE [facility] Cardiologist [facility]  [address] [place], [place] [address] [phone]  8:08 AM

## 2023-08-09 ENCOUNTER — Encounter: Payer: Self-pay | Admitting: Internal Medicine

## 2023-08-16 ENCOUNTER — Other Ambulatory Visit: Payer: Self-pay | Admitting: Internal Medicine

## 2023-09-21 ENCOUNTER — Ambulatory Visit (INDEPENDENT_AMBULATORY_CARE_PROVIDER_SITE_OTHER): Admitting: Pulmonary Disease

## 2023-09-21 VITALS — BP 127/76 | HR 87 | Ht 65.0 in | Wt 295.0 lb

## 2023-09-21 DIAGNOSIS — J4489 Other specified chronic obstructive pulmonary disease: Secondary | ICD-10-CM | POA: Diagnosis not present

## 2023-09-21 DIAGNOSIS — J439 Emphysema, unspecified: Secondary | ICD-10-CM

## 2023-09-21 MED ORDER — BUPROPION HCL ER (SR) 150 MG PO TB12
ORAL_TABLET | ORAL | 3 refills | Status: DC
Start: 2023-09-21 — End: 2023-12-20

## 2023-09-21 MED ORDER — NICOTINE 14 MG/24HR TD PT24: MEDICATED_PATCH | TRANSDERMAL | 0 refills | Status: DC

## 2023-09-21 NOTE — Progress Notes (Signed)
 Denise [name]    [id number]    Nov 26, 1970  Primary Care Physician:Denise [name] SAILOR, FNP  Referring Physician: Duwaine [name] SAILOR, FNP [address] [place],  [place] [address]  Chief complaint:   In for follow-up today  HPI:  Patient with a history of COPD, obstructive sleep apnea, unfortunately he started smoking as a bit more since the last visit Obesity with obesity hypoventilation, chronic respiratory failure on oxygen supplementation  She feels relatively well  Continues to use oxygen around-the-clock  Increased smoking from about 3 cigarettes to about 7 to 10 cigarettes a day  She is trying to stay active  She is trying to watch her weight  She is on oxygen at 2 L  Has been on CPAP for about 4 years Compliant with CPAP Wakes up feeling like she is rested well Download from her machine shows some mask leaks, she states it does not wake her up but does bother her spouse  Recent hospitalization in March for COPD exacerbation  She is limited with activities Requires a walker  Has a history of congestive heart failure, hemorrhagic CVA history, psychogenic seizures, PTSD, depression, anxiety, history of substance abuse in the past, some cognitive dysfunction.  Did have COVID in February 2021, history of syphilis  Outpatient Encounter Medications as of 09/21/2023  Medication Sig   amLODipine  (NORVASC ) 10 MG tablet Take 10 mg by mouth daily.   atorvastatin  (LIPITOR) 20 MG tablet Take 1 tablet (20 mg total) by mouth daily. Pt needs to keep upcoming appt in July for further refills   BAYER BACK & BODY PAIN EX ST 500-32.5 MG TABS Take 2 tablets by mouth in the morning and at bedtime.   Budeson-Glycopyrrol-Formoterol  (BREZTRI  AEROSPHERE) 160-9-4.8 MCG/ACT AERO Inhale 2 puffs into the lungs in the morning and at bedtime.   carbamazepine  (TEGRETOL ) 200 MG tablet Take 400 mg by mouth in the morning and at bedtime.   furosemide  (LASIX ) 80 MG tablet TAKE  1 TABLET BY MOUTH EVERY DAY (Patient taking differently: Take 80 mg by mouth in the morning.)   gabapentin  (NEURONTIN ) 300 MG capsule TAKE 3 CAPSULES BY MOUTH 3 TIMES DAILY   latanoprost (XALATAN) 0.005 % ophthalmic solution Apply 1 drop to eye.   lisinopril  (ZESTRIL ) 30 MG tablet Take 30 mg by mouth daily.   metFORMIN  (GLUCOPHAGE ) 500 MG tablet Take 500 mg by mouth 2 (two) times daily.   Olopatadine HCl 0.2 % SOLN Apply 1 drop to eye.   pantoprazole  (PROTONIX ) 40 MG tablet Take 1 tablet (40 mg total) by mouth daily.   Polyethyl Glycol-Propyl Glycol (SYSTANE ULTRA) 0.4-0.3 % SOLN Apply 1 drop to eye.   potassium chloride  SA (KLOR-CON  M) 20 MEQ tablet Take 1 tablet (20 mEq total) by mouth daily.   senna-docusate (SENOKOT-S) 8.6-50 MG tablet Take 1 tablet by mouth at bedtime as needed for mild constipation.   VENTOLIN  HFA 108 (90 Base) MCG/ACT inhaler inhale 2 PUFFS into THE lungs EVERY 6 HOURS AS NEEDED FOR WHEEZING OR SHORTNESS OF BREATH (Patient taking differently: Inhale 2 puffs into the lungs in the morning and at bedtime.)   No facility-administered encounter medications on file as of 09/21/2023.    Allergies as of 09/21/2023 - Review Complete 09/21/2023  Allergen Reaction Noted   Gadolinium derivatives Nausea And Vomiting and Other (See Comments) 11/05/2019   Grapefruit flavor [flavoring agent (non-screening)] Other (See Comments) 07/26/2017    Past Medical History:  Diagnosis Date  Abnormal EKG    Arthritis    Atopic dermatitis 05/28/2016   Back pain 07/29/2021   Blood transfusion without reported diagnosis    Borderline glaucoma with ocular hypertension, 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   Chronic diastolic CHF (congestive heart failure) 07/29/2021   Class 3 obesity with alveolar hypoventilation, serious comorbidity, and body mass index (BMI) of 50.0 to 59.9 in adult 06/12/2020   CVA (cerebrovascular accident) 2017   2017 MRI - chronic left lenticulostriate territory hemorrhage or  hemorrhagic infarct, with hemosiderin, and encephalomalacia affecting both the caudate and lentiform nucleus     Decreased mobility 06/15/2021   DOE (dyspnea on exertion) 06/12/2020   Essential hypertension 12/18/2014   Fibroid uterus 12/18/2014   Heart murmur, systolic 06/29/2021   History of alcohol abuse 02/16/2017   History of cocaine use 02/16/2017   History of latent syphilis 02/16/2017   Hyperlipidemia associated with type 2 diabetes mellitus 11/07/2020   Incontinence of urine    Lacunar infarction    MRI - chronic lacunar infarct within the posterior right lentiform nucleus.     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   Low back pain 11/21/2017   Lumbar degenerative disc disease    Major depressive disorder 02/16/2017   Nuclear sclerosis of both eyes 10/20/2022   OSA on CPAP 06/12/2020   Overactive bladder    Pain due to onychomycosis of toenails of both feet 05/26/2020   Pelvic pain in female 12/18/2014   Psychogenic nonepileptic seizure 02/17/2017   PTSD (post-traumatic stress disorder)    Pulmonary emphysema 08/31/2021   QT prolongation 06/29/2021   Right sided weakness    Social anxiety disorder 11/03/2017   Syncope 02/07/2015   TIA (transient ischemic attack)    Tobacco use 04/14/2015   Type 2 diabetes mellitus 10/21/2017   Venous stasis dermatitis of both lower extremities 06/15/2021    Past Surgical History:  Procedure Laterality Date   CESAREAN SECTION     x 2    Family History  Problem Relation Age of Onset   Stroke Mother    Anxiety disorder Mother    Drug abuse Mother    Alcohol abuse Mother    Depression Mother    Drug abuse Father    Anxiety disorder Father    Heart disease Maternal Grandmother    Colon cancer Neg Hx    Esophageal cancer Neg Hx    Pancreatic cancer Neg Hx    Stomach cancer Neg Hx    Liver disease Neg Hx    Breast cancer Neg Hx     Social History   Socioeconomic History   Marital status: Married    Spouse name: Denise [name]    Number of children: 3   Years of education: 6   Highest education level: 6th grade  Occupational History   Occupation: Disability  Tobacco Use   Smoking status: Every Day    Current packs/day: 0.40    Types: Cigarettes   Smokeless tobacco: Never   Tobacco comments:    Smoking 1 pack of cigarettes a week. 09/21/21 Tay    Patients states she is smoking about 8 cigarettes a day now. AB, CMA 12-08-2022     Pt states she is smoking about 3 cigarettes a day now. CC, CMA 06/22/2023  Vaping Use   Vaping status: Never Used  Substance and Sexual Activity   Alcohol use: Not on file   Drug use: Not Currently    Types: Crack cocaine   Sexual activity: Yes    Partners: Male  Birth control/protection: None  Other Topics Concern   Not on file  Social History Narrative   Not on file   Social Drivers of Health   Financial Resource Strain: Not on File (05/14/2021)   Received from [employer]    Financial Resource Strain: 0  Food Insecurity: No Food Insecurity (06/24/2023)   Hunger Vital Sign    Worried About Running Out of Food in the Last Year: Never true    Ran Out of Food in the Last Year: Never true  Transportation Needs: No Transportation Needs (06/24/2023)   PRAPARE - Administrator, Civil Service (Medical): No    Lack of Transportation (Non-Medical): No  Physical Activity: Not on File (05/14/2021)   [facility]   Physical Activity    Physical Activity: 0  Stress: Not on File (05/14/2021)   Received from [facility]   Stress    Stress: 0  Social Connections: Unknown (02/17/2023)   Social Connection and Isolation Panel    Frequency of Communication with Friends and Family: Three times a week    Frequency of Social Gatherings with Friends and Family: More than three times a week    Attends Religious Services: Patient unable to answer    Active Member of Clubs or Organizations: Not on file    Attends Banker Meetings: Patient unable  to answer    Marital Status: Married  Catering manager Violence: Not At Risk (06/24/2023)   Humiliation, Afraid, Rape, and Kick questionnaire    Fear of Current or Ex-Partner: No    Emotionally Abused: No    Physically Abused: No    Sexually Abused: No    Review of Systems  Constitutional:  Positive for fatigue.  Respiratory:  Positive for apnea and shortness of breath.   Psychiatric/Behavioral:  Positive for sleep disturbance.     Vitals:   09/21/23 1540  BP: 127/76  Pulse: 87  SpO2: 93%     Physical Exam Constitutional:      Appearance: She is obese.  HENT:     Head: Normocephalic.     Mouth/Throat:     Mouth: Mucous membranes are moist.  Eyes:     General: No scleral icterus. Cardiovascular:     Rate and Rhythm: Normal rate and regular rhythm.     Heart sounds: No murmur heard.    No friction rub.  Pulmonary:     Effort: No respiratory distress.     Breath sounds: No stridor. No wheezing or rhonchi.  Musculoskeletal:     Cervical back: No rigidity or tenderness.  Neurological:     Mental Status: She is alert.  Psychiatric:        Mood and Affect: Mood normal.    Data Reviewed: Recent hospital records reviewed  CPAP compliance reviewed with the patient showing very good compliance at 98% Average use of 11 hours 50 minutes CPAP of 16 Residual AHI of 1.8  Most recent echocardiogram 01/18/2023 shows normal ejection fraction  PFT 02/28/2023 shows moderate restriction with moderately reduced diffusing capacity  Assessment:  Obstructive sleep apnea on CPAP therapy Obesity hypoventilation -CPAP is working well - Compliant with use - Continue CPAP at current pressure of 16  Chronic respiratory failure - Continue oxygen supplementation  Regarding smoking - She is willing to work on quitting smoking - We discussed options of treatment - She agreed to trying Wellbutrin  and a nicotine  patch -Prescriptions were placed for this  Centrilobular emphysema -  Continue Breztri   Deconditioning - Continue graded activities as tolerated  Plan/Recommendations: Follow-up in 3 months  Smoking cessation counseling Prescription for nicotine  patches and Wellbutrin  sent to pharmacy  Continue using CPAP nightly  Graded activities as tolerated  Continue Breztri   Encouraged to call with significant concerns  Follow-up in about 3 months  Most recent CPAP settings of 16 still appears to be working well  I spent 30 minutes dedicated to the care of this patient on the date of this encounter to include previsit review of records, face-to-face time with the patient discussing conditions above, post visit ordering of testing,ordering medications,independentlyinterpreting results, clinical documentation with electronic health record and communicated necessary findings to members of the patient's care team   Jennet Epley MD [facility] 09/21/2023, 3:46 PM  CC: Denise [name] SAILOR, FNP

## 2023-09-21 NOTE — Patient Instructions (Signed)
 Continue using your CPAP nightly  Prescription for Wellbutrin  and a nicotine  patch was sent to pharmacy for you  [facility] you are able to get away from smoking  Continue using your oxygen around-the-clock  Graded activities as tolerated  Follow-up in 3 months

## 2023-10-03 ENCOUNTER — Other Ambulatory Visit: Payer: Self-pay | Admitting: Neurology

## 2023-10-10 ENCOUNTER — Other Ambulatory Visit: Payer: Self-pay | Admitting: Internal Medicine

## 2023-10-10 ENCOUNTER — Other Ambulatory Visit: Payer: Self-pay | Admitting: Nurse Practitioner

## 2023-10-10 DIAGNOSIS — J411 Mucopurulent chronic bronchitis: Secondary | ICD-10-CM

## 2023-11-07 ENCOUNTER — Other Ambulatory Visit: Payer: Self-pay | Admitting: Neurology

## 2023-11-07 ENCOUNTER — Other Ambulatory Visit: Payer: Self-pay | Admitting: Internal Medicine

## 2023-11-24 ENCOUNTER — Ambulatory Visit: Admitting: Podiatry

## 2023-11-24 ENCOUNTER — Telehealth: Payer: Self-pay | Admitting: Neurology

## 2023-11-24 MED ORDER — GABAPENTIN 300 MG PO CAPS: ORAL_CAPSULE | ORAL | 0 refills | Status: DC

## 2023-11-24 NOTE — Addendum Note (Signed)
 Addended by: OZELL JESUSA PARAS on: 11/24/2023 11:38 AM   Modules accepted: Orders

## 2023-11-24 NOTE — Telephone Encounter (Signed)
 Pharamcy calling for clarafication on refill rqst denial of gabapentin  (NEURONTIN ) 300 MG capsule if we can refill it or call back with clarafication

## 2023-11-24 NOTE — Telephone Encounter (Addendum)
 Another 270 cap sent in for patient today.  Patient will be provided with a refill at the time her visit to last her until she returns to see  DR.Jaffe.

## 2023-11-30 ENCOUNTER — Other Ambulatory Visit: Payer: Self-pay

## 2023-11-30 ENCOUNTER — Inpatient Hospital Stay (HOSPITAL_COMMUNITY)
Admission: EM | Admit: 2023-11-30 | Discharge: 2023-12-02 | DRG: 189 | Disposition: A | Discharge status: Home / Self Care | Attending: Internal Medicine | Admitting: Internal Medicine

## 2023-11-30 ENCOUNTER — Emergency Department (HOSPITAL_COMMUNITY)

## 2023-11-30 ENCOUNTER — Encounter (HOSPITAL_COMMUNITY): Payer: Self-pay

## 2023-11-30 DIAGNOSIS — G40909 Epilepsy, unspecified, not intractable, without status epilepticus: Secondary | ICD-10-CM | POA: Diagnosis present

## 2023-11-30 DIAGNOSIS — J441 Chronic obstructive pulmonary disease with (acute) exacerbation: Secondary | ICD-10-CM | POA: Diagnosis not present

## 2023-11-30 DIAGNOSIS — E119 Type 2 diabetes mellitus without complications: Secondary | ICD-10-CM

## 2023-11-30 DIAGNOSIS — I10 Essential (primary) hypertension: Secondary | ICD-10-CM | POA: Diagnosis not present

## 2023-11-30 DIAGNOSIS — Z888 Allergy status to other drugs, medicaments and biological substances status: Secondary | ICD-10-CM

## 2023-11-30 DIAGNOSIS — E1169 Type 2 diabetes mellitus with other specified complication: Secondary | ICD-10-CM | POA: Diagnosis present

## 2023-11-30 DIAGNOSIS — R0609 Other forms of dyspnea: Principal | ICD-10-CM

## 2023-11-30 DIAGNOSIS — E875 Hyperkalemia: Secondary | ICD-10-CM | POA: Diagnosis present

## 2023-11-30 DIAGNOSIS — F17210 Nicotine dependence, cigarettes, uncomplicated: Secondary | ICD-10-CM | POA: Diagnosis present

## 2023-11-30 DIAGNOSIS — N3281 Overactive bladder: Secondary | ICD-10-CM | POA: Diagnosis present

## 2023-11-30 DIAGNOSIS — J9621 Acute and chronic respiratory failure with hypoxia: Principal | ICD-10-CM | POA: Diagnosis present

## 2023-11-30 DIAGNOSIS — Z818 Family history of other mental and behavioral disorders: Secondary | ICD-10-CM

## 2023-11-30 DIAGNOSIS — Z6841 Body Mass Index (BMI) 40.0 and over, adult: Secondary | ICD-10-CM

## 2023-11-30 DIAGNOSIS — I639 Cerebral infarction, unspecified: Secondary | ICD-10-CM | POA: Diagnosis present

## 2023-11-30 DIAGNOSIS — Z79899 Other long term (current) drug therapy: Secondary | ICD-10-CM

## 2023-11-30 DIAGNOSIS — Z8673 Personal history of transient ischemic attack (TIA), and cerebral infarction without residual deficits: Secondary | ICD-10-CM

## 2023-11-30 DIAGNOSIS — F4310 Post-traumatic stress disorder, unspecified: Secondary | ICD-10-CM | POA: Diagnosis present

## 2023-11-30 DIAGNOSIS — C7800 Secondary malignant neoplasm of unspecified lung: Secondary | ICD-10-CM | POA: Diagnosis present

## 2023-11-30 DIAGNOSIS — Z1152 Encounter for screening for COVID-19: Secondary | ICD-10-CM

## 2023-11-30 DIAGNOSIS — Z794 Long term (current) use of insulin: Secondary | ICD-10-CM

## 2023-11-30 DIAGNOSIS — Z8249 Family history of ischemic heart disease and other diseases of the circulatory system: Secondary | ICD-10-CM

## 2023-11-30 DIAGNOSIS — F1410 Cocaine abuse, uncomplicated: Secondary | ICD-10-CM | POA: Diagnosis present

## 2023-11-30 DIAGNOSIS — J439 Emphysema, unspecified: Secondary | ICD-10-CM

## 2023-11-30 DIAGNOSIS — C799 Secondary malignant neoplasm of unspecified site: Secondary | ICD-10-CM | POA: Diagnosis present

## 2023-11-30 DIAGNOSIS — I5032 Chronic diastolic (congestive) heart failure: Secondary | ICD-10-CM | POA: Diagnosis present

## 2023-11-30 DIAGNOSIS — Z9981 Dependence on supplemental oxygen: Secondary | ICD-10-CM

## 2023-11-30 DIAGNOSIS — I110 Hypertensive heart disease with heart failure: Secondary | ICD-10-CM | POA: Diagnosis present

## 2023-11-30 DIAGNOSIS — Z813 Family history of other psychoactive substance abuse and dependence: Secondary | ICD-10-CM

## 2023-11-30 DIAGNOSIS — E66813 Obesity, class 3: Secondary | ICD-10-CM | POA: Diagnosis present

## 2023-11-30 DIAGNOSIS — Z811 Family history of alcohol abuse and dependence: Secondary | ICD-10-CM

## 2023-11-30 DIAGNOSIS — E7849 Other hyperlipidemia: Secondary | ICD-10-CM | POA: Diagnosis present

## 2023-11-30 DIAGNOSIS — Z823 Family history of stroke: Secondary | ICD-10-CM

## 2023-11-30 DIAGNOSIS — J432 Centrilobular emphysema: Secondary | ICD-10-CM | POA: Diagnosis present

## 2023-11-30 DIAGNOSIS — C781 Secondary malignant neoplasm of mediastinum: Secondary | ICD-10-CM | POA: Diagnosis present

## 2023-11-30 DIAGNOSIS — G4733 Obstructive sleep apnea (adult) (pediatric): Secondary | ICD-10-CM | POA: Diagnosis present

## 2023-11-30 DIAGNOSIS — H40053 Ocular hypertension, bilateral: Secondary | ICD-10-CM | POA: Diagnosis present

## 2023-11-30 DIAGNOSIS — Z7984 Long term (current) use of oral hypoglycemic drugs: Secondary | ICD-10-CM

## 2023-11-30 DIAGNOSIS — J9611 Chronic respiratory failure with hypoxia: Secondary | ICD-10-CM | POA: Diagnosis present

## 2023-11-30 LAB — I-STAT CHEM 8, ED
BUN: 9 mg/dL (ref 6–20)
Calcium, Ion: 1.04 mmol/L — ABNORMAL LOW (ref 1.15–1.40)
Chloride: 97 mmol/L — ABNORMAL LOW (ref 98–111)
Creatinine, Ser: 0.8 mg/dL (ref 0.44–1.00)
Glucose, Bld: 119 mg/dL — ABNORMAL HIGH (ref 70–99)
HCT: 48 % — ABNORMAL HIGH (ref 36.0–46.0)
Hemoglobin: 16.3 g/dL — ABNORMAL HIGH (ref 12.0–15.0)
Potassium: 5.9 mmol/L — ABNORMAL HIGH (ref 3.5–5.1)
Sodium: 136 mmol/L (ref 135–145)
TCO2: 32 mmol/L (ref 22–32)

## 2023-11-30 LAB — COMPREHENSIVE METABOLIC PANEL WITH GFR
ALT: 21 U/L (ref 0–44)
AST: 23 U/L (ref 15–41)
Albumin: 4.5 g/dL (ref 3.5–5.0)
Alkaline Phosphatase: 151 U/L — ABNORMAL HIGH (ref 38–126)
Anion gap: 13 (ref 5–15)
BUN: 8 mg/dL (ref 6–20)
CO2: 28 mmol/L (ref 22–32)
Calcium: 9.8 mg/dL (ref 8.9–10.3)
Chloride: 96 mmol/L — ABNORMAL LOW (ref 98–111)
Creatinine, Ser: 0.69 mg/dL (ref 0.44–1.00)
GFR, Estimated: >60 mL/min (ref >60)
Glucose, Bld: 125 mg/dL — ABNORMAL HIGH (ref 70–99)
Potassium: 3.8 mmol/L (ref 3.5–5.1)
Sodium: 138 mmol/L (ref 135–145)
Total Bilirubin: 0.4 mg/dL (ref 0.0–1.2)
Total Protein: 7.8 g/dL (ref 6.5–8.1)

## 2023-11-30 LAB — CBC WITH DIFFERENTIAL/PLATELET
Abs Immature Granulocytes: 0.02 K/uL (ref 0.00–0.07)
Basophils Absolute: 0 K/uL (ref 0.0–0.1)
Basophils Relative: 1 %
Eosinophils Absolute: 0.1 K/uL (ref 0.0–0.5)
Eosinophils Relative: 1 %
HCT: 48.1 % — ABNORMAL HIGH (ref 36.0–46.0)
Hemoglobin: 15.3 g/dL — ABNORMAL HIGH (ref 12.0–15.0)
Immature Granulocytes: 0 %
Lymphocytes Relative: 32 %
Lymphs Abs: 2.7 K/uL (ref 0.7–4.0)
MCH: 27.4 pg (ref 26.0–34.0)
MCHC: 31.8 g/dL (ref 30.0–36.0)
MCV: 86.2 fL (ref 80.0–100.0)
Monocytes Absolute: 0.7 K/uL (ref 0.1–1.0)
Monocytes Relative: 8 %
Neutro Abs: 4.9 K/uL (ref 1.7–7.7)
Neutrophils Relative %: 58 %
Platelets: 358 K/uL (ref 150–400)
RBC: 5.58 MIL/uL — ABNORMAL HIGH (ref 3.87–5.11)
RDW: 16.2 % — ABNORMAL HIGH (ref 11.5–15.5)
WBC: 8.5 K/uL (ref 4.0–10.5)
nRBC: 0 % (ref 0.0–0.2)

## 2023-11-30 LAB — URINALYSIS, ROUTINE W REFLEX MICROSCOPIC
Bacteria, UA: NONE SEEN
Bilirubin Urine: NEGATIVE
Glucose, UA: NEGATIVE mg/dL
Hgb urine dipstick: NEGATIVE
Ketones, ur: NEGATIVE mg/dL
Leukocytes,Ua: NEGATIVE
Nitrite: NEGATIVE
Protein, ur: NEGATIVE mg/dL
Specific Gravity, Urine: 1.017 (ref 1.005–1.030)
pH: 5 (ref 5.0–8.0)

## 2023-11-30 LAB — CARBAMAZEPINE LEVEL, TOTAL: Carbamazepine Lvl: 10.9 ug/mL (ref 4.0–12.0)

## 2023-11-30 LAB — MAGNESIUM: Magnesium: 1.8 mg/dL (ref 1.7–2.4)

## 2023-11-30 LAB — RESP PANEL BY RT-PCR (RSV, FLU A&B, COVID)  RVPGX2
Influenza A by PCR: NEGATIVE
Influenza B by PCR: NEGATIVE
Resp Syncytial Virus by PCR: NEGATIVE
SARS Coronavirus 2 by RT PCR: NEGATIVE

## 2023-11-30 LAB — ETHANOL: Alcohol, Ethyl (B): <15 mg/dL (ref <15)

## 2023-11-30 LAB — CBG MONITORING, ED: Glucose-Capillary: 137 mg/dL — ABNORMAL HIGH (ref 70–99)

## 2023-11-30 LAB — LIPASE, BLOOD: Lipase: 13 U/L (ref 11–51)

## 2023-11-30 LAB — GLUCOSE, CAPILLARY: Glucose-Capillary: 137 mg/dL — ABNORMAL HIGH (ref 70–99)

## 2023-11-30 LAB — PRO BRAIN NATRIURETIC PEPTIDE: Pro Brain Natriuretic Peptide: <50 pg/mL (ref <300.0)

## 2023-11-30 LAB — TROPONIN T, HIGH SENSITIVITY
Troponin T High Sensitivity: <15 ng/L (ref 0–19)
Troponin T High Sensitivity: <15 ng/L (ref 0–19)

## 2023-11-30 MED ORDER — IPRATROPIUM-ALBUTEROL 0.5-2.5 (3) MG/3ML IN SOLN
3.0000 mL | Freq: Once | RESPIRATORY_TRACT | Status: AC
  Administered 2023-11-30: 3 mL via RESPIRATORY_TRACT
  Filled 2023-11-30: qty 3

## 2023-11-30 MED ORDER — SODIUM CHLORIDE 0.9 % IV BOLUS
500.0000 mL | Freq: Once | INTRAVENOUS | Status: AC
  Administered 2023-11-30: 500 mL via INTRAVENOUS

## 2023-11-30 MED ORDER — SODIUM CHLORIDE 0.9 % IV SOLN
1.0000 g | Freq: Once | INTRAVENOUS | Status: AC
  Administered 2023-11-30: 1 g via INTRAVENOUS
  Filled 2023-11-30: qty 10

## 2023-11-30 MED ORDER — IPRATROPIUM-ALBUTEROL 0.5-2.5 (3) MG/3ML IN SOLN
3.0000 mL | Freq: Two times a day (BID) | RESPIRATORY_TRACT | Status: DC
  Administered 2023-12-01: 3 mL via RESPIRATORY_TRACT
  Filled 2023-11-30: qty 3

## 2023-11-30 MED ORDER — INSULIN ASPART 100 UNIT/ML IJ SOLN
0.0000 [IU] | Freq: Three times a day (TID) | INTRAMUSCULAR | Status: DC
  Administered 2023-12-01 (×2): 3 [IU] via SUBCUTANEOUS
  Filled 2023-11-30 (×2): qty 3

## 2023-11-30 MED ORDER — ATORVASTATIN CALCIUM 40 MG PO TABS
20.0000 mg | ORAL_TABLET | Freq: Every day | ORAL | Status: DC
  Administered 2023-12-01 – 2023-12-02 (×2): 20 mg via ORAL
  Filled 2023-11-30 (×2): qty 1

## 2023-11-30 MED ORDER — BUDESON-GLYCOPYRROL-FORMOTEROL 160-9-4.8 MCG/ACT IN AERO
2.0000 | INHALATION_SPRAY | Freq: Two times a day (BID) | RESPIRATORY_TRACT | Status: DC
  Administered 2023-12-01 – 2023-12-02 (×3): 2 via RESPIRATORY_TRACT
  Filled 2023-11-30: qty 5.9

## 2023-11-30 MED ORDER — IPRATROPIUM-ALBUTEROL 0.5-2.5 (3) MG/3ML IN SOLN: 3.0000 mL | RESPIRATORY_TRACT | Status: DC | PRN

## 2023-11-30 MED ORDER — METHYLPREDNISOLONE SODIUM SUCC 125 MG IJ SOLR
60.0000 mg | Freq: Two times a day (BID) | INTRAMUSCULAR | Status: AC
  Administered 2023-12-01 (×2): 60 mg via INTRAVENOUS
  Filled 2023-11-30 (×2): qty 2

## 2023-11-30 MED ORDER — PREDNISONE 20 MG PO TABS
40.0000 mg | ORAL_TABLET | Freq: Every day | ORAL | Status: DC
  Administered 2023-12-02: 40 mg via ORAL
  Filled 2023-11-30: qty 2

## 2023-11-30 MED ORDER — ENOXAPARIN SODIUM 120 MG/0.8ML IJ SOSY
120.0000 mg | PREFILLED_SYRINGE | INTRAMUSCULAR | Status: DC
  Administered 2023-11-30 – 2023-12-01 (×2): 120 mg via SUBCUTANEOUS
  Filled 2023-11-30 (×2): qty 0.8

## 2023-11-30 MED ORDER — ALBUTEROL SULFATE (2.5 MG/3ML) 0.083% IN NEBU
2.5000 mg | INHALATION_SOLUTION | Freq: Once | RESPIRATORY_TRACT | Status: AC
  Administered 2023-11-30: 2.5 mg via RESPIRATORY_TRACT
  Filled 2023-11-30: qty 3

## 2023-11-30 MED ORDER — SODIUM CHLORIDE 0.9 % IV SOLN
100.0000 mg | Freq: Once | INTRAVENOUS | Status: AC
  Administered 2023-11-30: 100 mg via INTRAVENOUS
  Filled 2023-11-30: qty 100

## 2023-11-30 MED ORDER — INSULIN ASPART 100 UNIT/ML IJ SOLN: 0.0000 [IU] | Freq: Every day | INTRAMUSCULAR | Status: DC

## 2023-11-30 MED ORDER — CARBAMAZEPINE 200 MG PO TABS
400.0000 mg | ORAL_TABLET | Freq: Two times a day (BID) | ORAL | Status: DC
  Administered 2023-11-30 – 2023-12-02 (×4): 400 mg via ORAL
  Filled 2023-11-30 (×4): qty 2

## 2023-11-30 MED ORDER — ACETAMINOPHEN 325 MG PO TABS
650.0000 mg | ORAL_TABLET | Freq: Four times a day (QID) | ORAL | Status: DC | PRN
  Administered 2023-12-02: 650 mg via ORAL
  Filled 2023-11-30: qty 2

## 2023-11-30 MED ORDER — METHYLPREDNISOLONE SODIUM SUCC 125 MG IJ SOLR
125.0000 mg | Freq: Once | INTRAMUSCULAR | Status: AC
  Administered 2023-11-30: 125 mg via INTRAVENOUS
  Filled 2023-11-30: qty 2

## 2023-11-30 MED ORDER — SODIUM CHLORIDE 0.9 % IV SOLN
100.0000 mg | Freq: Two times a day (BID) | INTRAVENOUS | Status: DC
  Administered 2023-11-30 – 2023-12-01 (×3): 100 mg via INTRAVENOUS
  Filled 2023-11-30 (×4): qty 100

## 2023-11-30 MED ORDER — PROMETHAZINE HCL 25 MG PO TABS: 12.5000 mg | ORAL_TABLET | Freq: Four times a day (QID) | ORAL | Status: DC | PRN

## 2023-11-30 MED ORDER — ACETAMINOPHEN 650 MG RE SUPP: 650.0000 mg | Freq: Four times a day (QID) | RECTAL | Status: DC | PRN

## 2023-11-30 MED ORDER — GUAIFENESIN-DM 100-10 MG/5ML PO SYRP: 5.0000 mL | ORAL_SOLUTION | ORAL | Status: DC | PRN

## 2023-11-30 MED ORDER — POLYETHYLENE GLYCOL 3350 17 G PO PACK: 17.0000 g | PACK | Freq: Every day | ORAL | Status: DC | PRN

## 2023-11-30 MED ORDER — FUROSEMIDE 40 MG PO TABS
80.0000 mg | ORAL_TABLET | Freq: Every day | ORAL | Status: DC
  Administered 2023-12-01 – 2023-12-02 (×2): 80 mg via ORAL
  Filled 2023-11-30 (×2): qty 2

## 2023-11-30 MED ORDER — PANTOPRAZOLE SODIUM 40 MG PO TBEC
40.0000 mg | DELAYED_RELEASE_TABLET | Freq: Every day | ORAL | Status: DC
  Administered 2023-12-01 – 2023-12-02 (×2): 40 mg via ORAL
  Filled 2023-11-30 (×2): qty 1

## 2023-11-30 MED ORDER — IOHEXOL 350 MG/ML SOLN
100.0000 mL | Freq: Once | INTRAVENOUS | Status: AC | PRN
  Administered 2023-11-30: 100 mL via INTRAVENOUS

## 2023-11-30 MED ORDER — FENTANYL CITRATE (PF) 50 MCG/ML IJ SOSY
50.0000 ug | PREFILLED_SYRINGE | Freq: Once | INTRAMUSCULAR | Status: AC
  Administered 2023-11-30: 50 ug via INTRAVENOUS
  Filled 2023-11-30: qty 1

## 2023-11-30 MED ORDER — AMLODIPINE BESYLATE 10 MG PO TABS
10.0000 mg | ORAL_TABLET | Freq: Every day | ORAL | Status: DC
  Administered 2023-12-01 – 2023-12-02 (×2): 10 mg via ORAL
  Filled 2023-11-30 (×2): qty 1

## 2023-11-30 NOTE — H&P (Addendum)
 History and Physical    Denise [name] FMW:[id number] DOB: 1970/10/02 DOA: 11/30/2023  PCP: Denise Annabella SAILOR, FNP   Patient coming from: Home  I have personally briefly reviewed patient's old medical records in [facility]  Chief Complaint: Difficulty breathing, cough  HPI: Denise [name] is [name] 53 y.o. female with medical history significant for COPD, chronic respiratory failure on 2 L, hypertension, alcohol and cocaine abuse, diabetes mellitus, OSA on CPAP, stroke. Patient presented to the ED with complaints of complaints of back pain radiating to the front, cough, and difficulty breathing over the past week.  She reports history of prior stroke and since then she has had problems with memory.  Reports ongoing back pain radiating to the front described as pressure-like with no known aggravating or relieving factors, persistent but worse is [name] location, of unknown duration.  She reports history of nonepileptic seizures.  She is on carbamazepine  but she is unsure why. EMS reports O2 sats in the high 80s on 2 L so she was increased to 6 L.  ED Course: Temperature 98.2.  Heart rate 77-99.  Heart rate 14-18.  Respiratory rate 130s to 166.  O2 sats greater than 96% on 2 L. While in the ED, while patient was being transferred to ER room, patient seemed to develop an episode of sudden onset mental status change, she stopped talking or responding, she was seen by the ED provider.  He reports patient had some pill-rolling, intermittent abdominal jaw movement, this lasted about 2 minutes and patient became more lucid and continued to speak to him. Troponin less than 15 x 2. proBNP less than 50. Unremarkable CBC BMP. Head CT negative for acute abnormality. 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  Possible left upper lobe lung primary. IV Solu-Medrol  125 mg  given, 500 mL bolus given, DuoNebs, ceftriaxone  and doxycycline  started.  Hospitalist to admit for COPD exacerbation.  Review of Systems: As per HPI all other systems reviewed and negative.  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   Chronic diastolic CHF (congestive heart failure) 07/29/2021   Class 3 obesity with alveolar hypoventilation, serious comorbidity, and body mass index (BMI) of 50.0 to 59.9 in adult 06/12/2020   CVA (cerebrovascular accident) 2017   2017 MRI - chronic left lenticulostriate territory hemorrhage or hemorrhagic infarct, with hemosiderin, and encephalomalacia affecting both the caudate and lentiform nucleus     Decreased mobility 06/15/2021   DOE (dyspnea on exertion) 06/12/2020   Essential hypertension 12/18/2014   Fibroid uterus 12/18/2014   Heart murmur, systolic 06/29/2021   History of alcohol abuse 02/16/2017   History of cocaine use 02/16/2017   History of latent syphilis 02/16/2017   Hyperlipidemia associated with type 2 diabetes mellitus 11/07/2020   Incontinence of urine    Lacunar infarction    MRI - chronic lacunar infarct within the posterior right lentiform nucleus.     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   Low back pain 11/21/2017   Lumbar degenerative disc disease    Major depressive disorder 02/16/2017   Nuclear sclerosis of both eyes 10/20/2022   OSA on CPAP 06/12/2020   Overactive bladder    Pain due to onychomycosis of toenails of both feet 05/26/2020  Pelvic pain in female 12/18/2014   Psychogenic nonepileptic seizure 02/17/2017   PTSD (post-traumatic stress disorder)    Pulmonary emphysema 08/31/2021   QT prolongation 06/29/2021   Right sided weakness    Social anxiety disorder 11/03/2017   Syncope 02/07/2015   TIA (transient ischemic  attack)    Tobacco use 04/14/2015   Type 2 diabetes mellitus 10/21/2017   Venous stasis dermatitis of both lower extremities 06/15/2021    Past Surgical History:  Procedure Laterality Date   CESAREAN SECTION     x 2     reports that she has been smoking cigarettes. She has never used smokeless tobacco. She reports that she does not currently use drugs after having used the following drugs: Crack cocaine. No history on file for alcohol use.  Allergies  Allergen Reactions   Gadolinium Derivatives Nausea And Vomiting and Other (See Comments)    Nausea and vomiting was followed by epileptic seizure episode that lasted approximately 5 minutes; pt was unable to verbally communicate during that time; she then came to and was able to speak; was evaluated by Rad and RN; kms    Grapefruit Flavor [Flavoring Agent (Non-Screening)] Other (See Comments)    Drug interaction    Family History  Problem Relation Age of Onset   Stroke Mother    Anxiety disorder Mother    Drug abuse Mother    Alcohol abuse Mother    Depression Mother    Drug abuse Father    Anxiety disorder Father    Heart disease Maternal Grandmother    Colon cancer Neg Hx    Esophageal cancer Neg Hx    Pancreatic cancer Neg Hx    Stomach cancer Neg Hx    Liver disease Neg Hx    Breast cancer Neg Hx     Prior to Admission medications   Medication Sig Start Date End Date Taking? Authorizing Provider  amLODipine  (NORVASC ) 10 MG tablet Take 10 mg by mouth daily. 05/25/23   [provider]  atorvastatin  (LIPITOR) 20 MG tablet Take 1 tablet (20 mg total) by mouth daily. Pt needs to keep upcoming appt in July for further refills 07/20/23   [name], Denise LABOR, MD  [facility] BACK & BODY PAIN EX ST 500-32.5 MG TABS Take 2 tablets by mouth in the morning and at bedtime.    [provider]  budesonide -glycopyrrolate -formoterol  (BREZTRI  AEROSPHERE) 160-9-4.8 MCG/ACT AERO inhaler Inhale 2 puffs into the lungs in the  morning and at bedtime. 10/11/23   [name], Denise GAILS, NP  buPROPion  (WELLBUTRIN  SR) 150 MG 12 hr tablet 150 mg p.o. daily for 7 days then 150 mg p.o. twice daily for 1 month 09/21/23   Denise Hammond A, MD  carbamazepine  (TEGRETOL ) 200 MG tablet TAKE 2 TABLETS BY MOUTH 2 TIMES DAILY 11/08/23   Skeet, Adam R, DO  furosemide  (LASIX ) 80 MG tablet TAKE 1 TABLET BY MOUTH EVERY DAY 11/10/23   [name], Mahesh A, MD  gabapentin  (NEURONTIN ) 300 MG capsule TAKE 3 CAPSULES BY MOUTH 3 TIMES DAILY 11/24/23   Skeet, Adam R, DO  latanoprost (XALATAN) 0.005 % ophthalmic solution Apply 1 drop to eye. 09/19/23 09/18/24  [provider]  lisinopril  (ZESTRIL ) 30 MG tablet Take 30 mg by mouth daily. 03/29/23   [provider]  metFORMIN  (GLUCOPHAGE ) 500 MG tablet Take 500 mg by mouth 2 (two) times daily. 05/25/17   [provider]  nicotine  (NICODERM CQ  - DOSED IN MG/24 HOURS) 14 mg/24hr patch RX #2 Weeks  5-6: 14 mg x 1 patch daily. Wear for 24 hours. If you have sleep disturbances, remove at bedtime. 09/21/23   Denise Hammond A, MD  Olopatadine HCl 0.2 % SOLN Apply 1 drop to eye. 09/19/23   [provider]  pantoprazole  (PROTONIX ) 40 MG tablet Take 1 tablet (40 mg total) by mouth daily. 06/26/23 09/21/23  Cindy Garnette POUR, MD  Polyethyl Glycol-Propyl Glycol (SYSTANE ULTRA) 0.4-0.3 % SOLN Apply 1 drop to eye. 09/19/23   [provider]  potassium chloride  SA (KLOR-CON  M) 20 MEQ tablet Take 1 tablet (20 mEq total) by mouth daily. 08/01/21   Jillian Buttery, MD  senna-docusate (SENOKOT-S) 8.6-50 MG tablet Take 1 tablet by mouth at bedtime as needed for mild constipation. 02/20/23   Cherlyn Labella, MD  VENTOLIN  HFA 108 (90 Base) MCG/ACT inhaler inhale 2 PUFFS into THE lungs EVERY 6 HOURS AS NEEDED FOR WHEEZING OR SHORTNESS OF BREATH Patient taking differently: Inhale 2 puffs into the lungs in the morning and at bedtime. 02/15/22   Shellia Oh, MD    Physical Exam: Vitals:   11/30/23  0905 11/30/23 0906 11/30/23 1257  BP: (!) 166/89 (!) 166/89 (!) 145/66  Pulse: 81 77 84  Resp: 15 14 18   Temp:  98.2 F (36.8 C) 98.1 F (36.7 C)  TempSrc:  Oral Oral  SpO2: 100% 99% 97%    Constitutional: NAD, calm, comfortable Vitals:   11/30/23 0905 11/30/23 0906 11/30/23 1257  BP: (!) 166/89 (!) 166/89 (!) 145/66  Pulse: 81 77 84  Resp: 15 14 18   Temp:  98.2 F (36.8 C) 98.1 F (36.7 C)  TempSrc:  Oral Oral  SpO2: 100% 99% 97%   Eyes: PERRL, lids and conjunctivae normal ENMT: Mucous membranes are moist. Neck: normal, supple, no masses, no thyromegaly Respiratory: Faint sparse expiratory wheezing, no crackles. Normal respiratory effort. No accessory muscle use.  Cardiovascular: Regular rate and rhythm, no murmurs / rubs / gallops. No extremity edema.  Extremities warm. Abdomen: no tenderness, no masses palpated. No hepatosplenomegaly. Bowel sounds positive.  Musculoskeletal: no clubbing / cyanosis. No joint deformity upper and lower extremities.  Skin: no rashes, lesions, ulcers. No induration Neurologic: No facial asymmetry, moves extremities spontaneously, speech fluent Psychiatric: Normal judgment and insight. Alert and oriented x 3. Normal mood.   Labs on Admission: I have personally reviewed following labs and imaging studies  CBC: Recent Labs  Lab 11/30/23 0908 11/30/23 1011  WBC 8.5  --   NEUTROABS 4.9  --   HGB 15.3* 16.3*  HCT 48.1* 48.0*  MCV 86.2  --   PLT 358  --    Basic Metabolic Panel: Recent Labs  Lab 11/30/23 0908 11/30/23 1011  NA 138 136  K 3.8 5.9*  CL 96* 97*  CO2 28  --   GLUCOSE 125* 119*  BUN 8 9  CREATININE 0.69 0.80  CALCIUM  9.8  --   MG 1.8  --    GFR: CrCl cannot be calculated (Unknown ideal weight.). Liver Function Tests: Recent Labs  Lab 11/30/23 0908  AST 23  ALT 21  ALKPHOS 151*  BILITOT 0.4  PROT 7.8  ALBUMIN 4.5   Recent Labs  Lab 11/30/23 0908  LIPASE 13   BNP (last 3 results) Recent Labs     11/30/23 0908  PROBNP <50.0   HbA1C: No results for input(s): HGBA1C in the last 72 hours. CBG: Recent Labs  Lab 11/30/23 0859  GLUCAP 137*   Urine analysis:    Component  Value Date/Time   COLORURINE STRAW ([name]) 11/30/2023 1239   APPEARANCEUR CLEAR 11/30/2023 1239   LABSPEC 1.017 11/30/2023 1239   PHURINE 5.0 11/30/2023 1239   GLUCOSEU NEGATIVE 11/30/2023 1239   HGBUR NEGATIVE 11/30/2023 1239   BILIRUBINUR NEGATIVE 11/30/2023 1239   KETONESUR NEGATIVE 11/30/2023 1239   PROTEINUR NEGATIVE 11/30/2023 1239   UROBILINOGEN 0.2 11/22/2014 0240   NITRITE NEGATIVE 11/30/2023 1239   LEUKOCYTESUR NEGATIVE 11/30/2023 1239    Radiological Exams on Admission: CT Angio Chest/Abd/Pel for Dissection W and/or Wo Contrast Result Date: 11/30/2023 EXAM: CTA CHEST, ABDOMEN AND PELVIS WITH AND WITHOUT CONTRAST 11/30/2023 11:27:02 AM TECHNIQUE: CTA of the chest was performed with and without the administration of intravenous contrast. CTA of the abdomen and pelvis was performed with and without the administration of intravenous contrast. 100 mL of iohexol  (OMNIPAQUE ) 350 MG/ML injection was administered. Multiplanar reformatted images are provided for review. MIP images are provided for review. Automated exposure control, iterative reconstruction, and/or weight based adjustment of the mA/kV was utilized to reduce the radiation dose to as low as reasonably achievable. COMPARISON: CTA chest 07/29/2021. CT abdomen and pelvis 06/23/2023. CLINICAL HISTORY: 53 year old female. Acute aortic syndrome (AAS) suspected. FINDINGS: VASCULATURE: AORTA: Calcified thoracic aorta atherosclerosis. Abdominal aortic calcified atherosclerosis. No dissection. No abdominal aortic aneurysm. Central pulmonary arteries appear to be patent. Iliac artery calcified atherosclerosis. No occlusion or significant stenosis. Proximal femoral artery calcified atherosclerosis. Major arterial structures in the abdomen and pelvis are patent.  CHEST: MEDIASTINUM: Heart size remains normal. No pericardial effusion. Infiltrative appearing mediastinal soft tissue which is likely metastatic ex nodal disease (series 7 image 29). Other discrete superior mediastinum, prevascular space lymphadenopathy measuring 12 mm short axis (the same node was 3 to 4 mm in 2023). Associated left hilar lymph nodes are 9 mm short axis. LUNGS AND PLEURA: 1.9 cm spiculated posterior left upper lobe lung mass adjacent to the major fissure on series 6 image 40, possibly representing [name] primary lung malignancy. Superimposed diffuse pulmonary nodularity in all lobes, innumerable lung nodules most compatible with metastatic disease, many between 5 and 10 mm in size. No evidence of pleural effusion or pneumothorax. THORACIC BONES AND SOFT TISSUES: No acute or suspicious osseous lesion identified by CT in the chest. No acute soft tissue abnormality. ABDOMEN AND PELVIS: LIVER: Early postcontrast appearance of the liver with interval limits. On delayed phase images, the visible more delayed postcontrast appearance of the liver is within normal limits. GALLBLADDER AND BILE DUCTS: Early postcontrast appearance of the gallbladder with interval limits. On delayed phase images, the visible more delayed postcontrast appearance of the gallbladder is within normal limits. No biliary ductal dilatation. SPLEEN: Early postcontrast appearance of the spleen with interval limits. On delayed phase images, the visible more delayed postcontrast appearance of the spleen is within normal limits. PANCREAS: Early postcontrast appearance of the pancreas with interval limits. On delayed phase images, the visible more delayed postcontrast appearance of the pancreas is within normal limits. ADRENAL GLANDS: Bilateral adrenal glands appear normal. KIDNEYS, URETERS AND BLADDER: Kidneys appear nonobstructed and with interval limits on early postcontrast images. On delayed phase images, there is symmetric and normal  renal contrast excretion from both kidneys. No stones in the kidneys or ureters. No hydronephrosis. No perinephric or periureteral stranding. Urinary bladder is unremarkable. GI AND BOWEL: Nondilated bowel loops. Diminutive, normal appendix on series 8 image 35. There is no bowel obstruction. No abnormal bowel wall thickening or distension. REPRODUCTIVE: Calcified uterine fundal fibroids, up to 4  cm diameter, stable. Incidental pelvic phleboliths. PERITONEUM AND RETROPERITONEUM: Stable circumscribed and simple fluid attenuation cystic lesion adjacent to the distal abdominal aorta and IVC, probably benign and perhaps lymphatic in nature. No ascites or free air. LYMPH NODES: No lymphadenopathy identified in the abdomen or pelvis. ABDOMINAL BONES AND SOFT TISSUES: No acute or suspicious osseous lesion identified in the abdomen or pelvis. IMPRESSION: 1. 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 Possible posterior left upper lobe lung primary (1.9 cm). 2. Negative for aortic dissection or aneurysm. Positive for aortic atherosclerosis. 3. No metastatic disease identified in the abdomen or pelvis Electronically signed by: Helayne Hurst MD 11/30/2023 11:45 AM EST RP Workstation: [id number]   CT Head Wo Contrast Result Date: 11/30/2023 EXAM: CT HEAD WITHOUT CONTRAST 11/30/2023 11:27:02 AM TECHNIQUE: CT of the head was performed without the administration of intravenous contrast. Automated exposure control, iterative reconstruction, and/or weight based adjustment of the mA/kV was utilized to reduce the radiation dose to as low as reasonably achievable. COMPARISON: Brain MRI 08/20/2021, head CT 09/25/2019. CLINICAL HISTORY: 53 year old female. Mental status change, unknown cause. FINDINGS: BRAIN AND VENTRICLES: No acute hemorrhage. Chronic lacunar infarct anterior left basal ganglia is stable from priors. Mild ex vacuo enlargement of the left frontal horn.  Otherwise normal brain volume, ventricle size and configuration. Normal gray white differentiation elsewhere. No hydrocephalus. No extra-axial collection. No mass effect or midline shift. Calcified atherosclerosis at the skull base. No suspicious intracranial vascular hyperdensity. ORBITS: No acute abnormality. No gaze deviation. SINUSES: Paranasal sinuses, middle ears and mastoids remain well aerated. SOFT TISSUES AND SKULL: No acute soft tissue abnormality. Hyperostosis of the calvarium, normal variant. No skull fracture. IMPRESSION: 1. No acute intracranial abnormality.  Chronic left basal ganglia infarct. Electronically signed by: Helayne Hurst MD 11/30/2023 11:32 AM EST RP Workstation: [id number]   DG Chest Portable 1 View Result Date: 11/30/2023 CLINICAL DATA:  cough EXAM: PORTABLE CHEST - 1 VIEW COMPARISON:  04/08/2023 FINDINGS: Vague patchy opacity in the right lung base. No pleural effusion or pneumothorax. No cardiomegaly. Aortic atherosclerosis. No acute fracture or destructive lesions. Multilevel thoracic osteophytosis. IMPRESSION: Vague, patchy airspace opacity in the right lung base, possibly representing atelectasis or [name] developing bronchopneumonia. Electronically Signed   By: Rogelia Myers M.D.   On: 11/30/2023 09:33    EKG: Independently reviewed.  Sinus rhythm, rate 76, QTc 477.  No significant change from prior.  Assessment/Plan Principal Problem:   COPD with acute exacerbation (HCC)   Assessment and Plan:  COPD exacerbation-faint sparse expiratory wheezing, reports dyspnea, cough over the past week.  Mild wheezing on arrival to the ED appears to have improved she is status post multiple DuoNebs.  Reported O2 sats in the 80s at home her chronic 2 L.  O2 sats here greater than 96% on 2 L.  Reports back pain radiating to chest.  CTA chest/abd/pelvis-negative for dissection or other acute abnormality, shows evidence of advanced thoracic malignancy. - IV Solu-Medrol  125 mg given in ED,  continue 60 mg twice daily - DuoNebs as needed and scheduled - Continue doxycycline   History of seizures-episode of unresponsiveness in the ED, no postictal state.  Head CT-without acute abnormality, shows chronic left basal ganglia infarct.  She reports history of nonepileptic seizures.  Per med list, she was started on Tegretol  11/08/2023. By Dr. Skeet. ??  Indication - Obtain EEG -Monitor in stepdown, on telemetry - Resume Tegretol   Thoracic malignancy -new finding on CTA today CTA chest abdomen  and pelvis- evidence of advanced thoracic Malignancy, with mediastinal ex- nodal disease and diffuse pulmonary metastases. Possible posterior left upper lobe lung primary (1.9 cm).  - Needs outpatient follow-up  Chest/back pain-troponins, EKG, CTA chest negative for acute abnormality to explain pain.  History of alcohol and cocaine use. - Obtain UDS  Diastolic CHF-stable and compensated.  Last echo 12/2022, EF 60 to 65% - Resume Lasix  80 mg daily.  Hypertension-stable. - Resume amlodipine  10, - Holding lisinopril  30 for contrast exposure  Diabetes mellitus-typically controlled.  Last A1c 05/2023-6.2. - SSI- S - Hold metformin   Substance abuse-tobacco, cocaine history  OSA on CPAP   DVT prophylaxis: Lovenox  Code Status: Full code Family Communication: None at bedside Disposition Plan: ~ 2 days Consults called: None Admission status:  Obs stepdown   Author: Tully FORBES Carwin, MD 11/30/2023 9:32 PM  For on call review [email or web address].

## 2023-11-30 NOTE — ED Provider Notes (Signed)
 [place] [facility] AT [facility] Provider Note   CSN: [id number] Arrival date & time: 11/30/23  9155     Patient presents with: Chest Pain   Denise [name] is a 53 y.o. female.   HPI 53 year old female presents primarily with chest and back pain and cough.  EMS provides the initial history as the patient is altered on arrival but then clears up.  They report that she has had about a week of cough, chest and back pain.  Normally on 2 L but they had to go up to 6 L due to sats in the high 80s on her home oxygen.  While being transferred to the ER room she seemed to develop a sudden onset of mental status change and was not talking anymore.  This is when I initially evaluated her.  After few minutes she did start to become more lucid and speak to me.  She is now speaking clearly and telling me that she is having chest, abdominal, and right sided back pain.  She is not sure exactly how long these have been going on. She states she has been having a lot of memory problems since her stroke, which limits her history. She does endorse a cough as well as a headache and pain all over.  Prior to Admission medications   Medication Sig Start Date End Date Taking? Authorizing Provider  amLODipine  (NORVASC ) 10 MG tablet Take 10 mg by mouth daily. 05/25/23   [provider]  atorvastatin  (LIPITOR) 20 MG tablet Take 1 tablet (20 mg total) by mouth daily. Pt needs to keep upcoming appt in July for further refills 07/20/23   Chandrasekhar, Stanly LABOR, MD  BAYER BACK & BODY PAIN EX ST 500-32.5 MG TABS Take 2 tablets by mouth in the morning and at bedtime.    [provider]  budesonide -glycopyrrolate -formoterol  (BREZTRI  AEROSPHERE) 160-9-4.8 MCG/ACT AERO inhaler Inhale 2 puffs into the lungs in the morning and at bedtime. 10/11/23   Cobb, Comer GAILS, NP  buPROPion  (WELLBUTRIN  SR) 150 MG 12 hr tablet 150 mg p.o. daily for 7 days then 150 mg p.o. twice daily for 1  month 09/21/23   Olalere, Adewale A, MD  carbamazepine  (TEGRETOL ) 200 MG tablet TAKE 2 TABLETS BY MOUTH 2 TIMES DAILY 11/08/23   Skeet, Adam R, DO  furosemide  (LASIX ) 80 MG tablet TAKE 1 TABLET BY MOUTH EVERY DAY 11/10/23   Chandrasekhar, Mahesh A, MD  gabapentin  (NEURONTIN ) 300 MG capsule TAKE 3 CAPSULES BY MOUTH 3 TIMES DAILY 11/24/23   Skeet, Adam R, DO  latanoprost (XALATAN) 0.005 % ophthalmic solution Apply 1 drop to eye. 09/19/23 09/18/24  [provider]  lisinopril  (ZESTRIL ) 30 MG tablet Take 30 mg by mouth daily. 03/29/23   [provider]  metFORMIN  (GLUCOPHAGE ) 500 MG tablet Take 500 mg by mouth 2 (two) times daily. 05/25/17   [provider]  nicotine  (NICODERM CQ  - DOSED IN MG/24 HOURS) 14 mg/24hr patch RX #2 Weeks 5-6: 14 mg x 1 patch daily. Wear for 24 hours. If you have sleep disturbances, remove at bedtime. 09/21/23   Neda Hammond A, MD  Olopatadine HCl 0.2 % SOLN Apply 1 drop to eye. 09/19/23   [provider]  pantoprazole  (PROTONIX ) 40 MG tablet Take 1 tablet (40 mg total) by mouth daily. 06/26/23 09/21/23  Cindy Garnette POUR, MD  Polyethyl Glycol-Propyl Glycol (SYSTANE ULTRA) 0.4-0.3 % SOLN Apply 1 drop to eye. 09/19/23   [provider]  potassium chloride   SA (KLOR-CON  M) 20 MEQ tablet Take 1 tablet (20 mEq total) by mouth daily. 08/01/21   Jillian Buttery, MD  senna-docusate (SENOKOT-S) 8.6-50 MG tablet Take 1 tablet by mouth at bedtime as needed for mild constipation. 02/20/23   Cherlyn Labella, MD  VENTOLIN  HFA 108 (90 Base) MCG/ACT inhaler inhale 2 PUFFS into THE lungs EVERY 6 HOURS AS NEEDED FOR WHEEZING OR SHORTNESS OF BREATH Patient taking differently: Inhale 2 puffs into the lungs in the morning and at bedtime. 02/15/22   Sood, Vineet, MD    Allergies: Gadolinium derivatives and Grapefruit flavor [flavoring agent (non-screening)]    Review of Systems  Unable to perform ROS: Mental status change    Updated Vital Signs BP (!) 145/66 (BP  Location: Left Arm)   Pulse 84   Temp 98.1 F (36.7 C) (Oral)   Resp 18   LMP 05/29/2017 (Approximate)   SpO2 97%   Physical Exam Vitals and nursing note reviewed.  Constitutional:      Appearance: She is well-developed. She is not diaphoretic.  HENT:     Head: Normocephalic and atraumatic.  Cardiovascular:     Rate and Rhythm: Normal rate and regular rhythm.     Pulses:          Dorsalis pedis pulses are 2+ on the right side and 2+ on the left side.     Heart sounds: Normal heart sounds.  Pulmonary:     Effort: Pulmonary effort is normal.     Breath sounds: Wheezing (mild) present.  Abdominal:     Palpations: Abdomen is soft.     Tenderness: There is generalized abdominal tenderness.  Musculoskeletal:     Right lower leg: No edema.     Left lower leg: No edema.  Skin:    General: Skin is warm and dry.  Neurological:     Mental Status: She is alert.     Comments: Initially, patient's is awake and will look at me when asked but does not otherwise significantly respond.  Perhaps some pill-rolling in her left hand.  Some intermittent abnormal jaw movement but then this goes away.  After she further wakes up, she is awake but due to her memory issues unable to answer certain questions.  She has no facial droop.  Equal strength in both upper extremities.  She feels like her legs are weak and when I move them it induces some pain and she will reflexively move her legs.  However she will not lift her legs off the stretcher when asked to.  She does seem to wiggle her toes and move her feet spontaneously however.     (all labs ordered are listed, but only abnormal results are displayed) Labs Reviewed  COMPREHENSIVE METABOLIC PANEL WITH GFR - Abnormal; Notable for the following components:      Result Value   Chloride 96 (*)    Glucose, Bld 125 (*)    Alkaline Phosphatase 151 (*)    All other components within normal limits  CBC WITH DIFFERENTIAL/PLATELET - Abnormal; Notable for the  following components:   RBC 5.58 (*)    Hemoglobin 15.3 (*)    HCT 48.1 (*)    RDW 16.2 (*)    All other components within normal limits  URINALYSIS, ROUTINE W REFLEX MICROSCOPIC - Abnormal; Notable for the following components:   Color, Urine STRAW (*)    All other components within normal limits  CBG MONITORING, ED - Abnormal; Notable for the following components:  Glucose-Capillary 137 (*)    All other components within normal limits  I-STAT CHEM 8, ED - Abnormal; Notable for the following components:   Potassium 5.9 (*)    Chloride 97 (*)    Glucose, Bld 119 (*)    Calcium , Ion 1.04 (*)    Hemoglobin 16.3 (*)    HCT 48.0 (*)    All other components within normal limits  RESP PANEL BY RT-PCR (RSV, FLU A&B, COVID)  RVPGX2  CULTURE, BLOOD (ROUTINE X 2)  CULTURE, BLOOD (ROUTINE X 2)  CARBAMAZEPINE  LEVEL, TOTAL  MAGNESIUM  LIPASE, BLOOD  PRO BRAIN NATRIURETIC PEPTIDE  ETHANOL  I-STAT CG4 LACTIC ACID, ED  I-STAT CG4 LACTIC ACID, ED  TROPONIN T, HIGH SENSITIVITY  TROPONIN T, HIGH SENSITIVITY    EKG: EKG Interpretation Date/Time:  Wednesday November 30 2023 09:25:40 EST Ventricular Rate:  76 PR Interval:  159 QRS Duration:  90 QT Interval:  424 QTC Calculation: 477 R Axis:   26  Text Interpretation: Sinus rhythm Low voltage, precordial leads nonspecific T waves similar to May 2025 Confirmed by Freddi Hamilton [id number]) on 11/30/2023 9:31:56 AM  Radiology: CT Angio Chest/Abd/Pel for Dissection W and/or Wo Contrast Result Date: 11/30/2023 EXAM: CTA CHEST, ABDOMEN AND PELVIS WITH AND WITHOUT CONTRAST 11/30/2023 11:27:02 AM TECHNIQUE: CTA of the chest was performed with and without the administration of intravenous contrast. CTA of the abdomen and pelvis was performed with and without the administration of intravenous contrast. 100 mL of iohexol  (OMNIPAQUE ) 350 MG/ML injection was administered. Multiplanar reformatted images are provided for review. MIP images are provided for  review. Automated exposure control, iterative reconstruction, and/or weight based adjustment of the mA/kV was utilized to reduce the radiation dose to as low as reasonably achievable. COMPARISON: CTA chest 07/29/2021. CT abdomen and pelvis 06/23/2023. CLINICAL HISTORY: 53 year old female. Acute aortic syndrome (AAS) suspected. FINDINGS: VASCULATURE: AORTA: Calcified thoracic aorta atherosclerosis. Abdominal aortic calcified atherosclerosis. No dissection. No abdominal aortic aneurysm. Central pulmonary arteries appear to be patent. Iliac artery calcified atherosclerosis. No occlusion or significant stenosis. Proximal femoral artery calcified atherosclerosis. Major arterial structures in the abdomen and pelvis are patent. CHEST: MEDIASTINUM: Heart size remains normal. No pericardial effusion. Infiltrative appearing mediastinal soft tissue which is likely metastatic ex nodal disease (series 7 image 29). Other discrete superior mediastinum, prevascular space lymphadenopathy measuring 12 mm short axis (the same node was 3 to 4 mm in 2023). Associated left hilar lymph nodes are 9 mm short axis. LUNGS AND PLEURA: 1.9 cm spiculated posterior left upper lobe lung mass adjacent to the major fissure on series 6 image 40, possibly representing a primary lung malignancy. Superimposed diffuse pulmonary nodularity in all lobes, innumerable lung nodules most compatible with metastatic disease, many between 5 and 10 mm in size. No evidence of pleural effusion or pneumothorax. THORACIC BONES AND SOFT TISSUES: No acute or suspicious osseous lesion identified by CT in the chest. No acute soft tissue abnormality. ABDOMEN AND PELVIS: LIVER: Early postcontrast appearance of the liver with interval limits. On delayed phase images, the visible more delayed postcontrast appearance of the liver is within normal limits. GALLBLADDER AND BILE DUCTS: Early postcontrast appearance of the gallbladder with interval limits. On delayed phase images,  the visible more delayed postcontrast appearance of the gallbladder is within normal limits. No biliary ductal dilatation. SPLEEN: Early postcontrast appearance of the spleen with interval limits. On delayed phase images, the visible more delayed postcontrast appearance of the spleen is within normal limits. PANCREAS: Early  postcontrast appearance of the pancreas with interval limits. On delayed phase images, the visible more delayed postcontrast appearance of the pancreas is within normal limits. ADRENAL GLANDS: Bilateral adrenal glands appear normal. KIDNEYS, URETERS AND BLADDER: Kidneys appear nonobstructed and with interval limits on early postcontrast images. On delayed phase images, there is symmetric and normal renal contrast excretion from both kidneys. No stones in the kidneys or ureters. No hydronephrosis. No perinephric or periureteral stranding. Urinary bladder is unremarkable. GI AND BOWEL: Nondilated bowel loops. Diminutive, normal appendix on series 8 image 35. There is no bowel obstruction. No abnormal bowel wall thickening or distension. REPRODUCTIVE: Calcified uterine fundal fibroids, up to 4 cm diameter, stable. Incidental pelvic phleboliths. PERITONEUM AND RETROPERITONEUM: Stable circumscribed and simple fluid attenuation cystic lesion adjacent to the distal abdominal aorta and IVC, probably benign and perhaps lymphatic in nature. No ascites or free air. LYMPH NODES: No lymphadenopathy identified in the abdomen or pelvis. ABDOMINAL BONES AND SOFT TISSUES: No acute or suspicious osseous lesion identified in the abdomen or pelvis. IMPRESSION: 1. 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 Possible posterior left upper lobe lung primary (1.9 cm). 2. Negative for aortic dissection or aneurysm. Positive for aortic atherosclerosis. 3. No metastatic disease identified in the abdomen or pelvis Electronically signed by: Helayne Hurst MD  11/30/2023 11:45 AM EST RP Workstation: [id number]   CT Head Wo Contrast Result Date: 11/30/2023 EXAM: CT HEAD WITHOUT CONTRAST 11/30/2023 11:27:02 AM TECHNIQUE: CT of the head was performed without the administration of intravenous contrast. Automated exposure control, iterative reconstruction, and/or weight based adjustment of the mA/kV was utilized to reduce the radiation dose to as low as reasonably achievable. COMPARISON: Brain MRI 08/20/2021, head CT 09/25/2019. CLINICAL HISTORY: 53 year old female. Mental status change, unknown cause. FINDINGS: BRAIN AND VENTRICLES: No acute hemorrhage. Chronic lacunar infarct anterior left basal ganglia is stable from priors. Mild ex vacuo enlargement of the left frontal horn. Otherwise normal brain volume, ventricle size and configuration. Normal gray white differentiation elsewhere. No hydrocephalus. No extra-axial collection. No mass effect or midline shift. Calcified atherosclerosis at the skull base. No suspicious intracranial vascular hyperdensity. ORBITS: No acute abnormality. No gaze deviation. SINUSES: Paranasal sinuses, middle ears and mastoids remain well aerated. SOFT TISSUES AND SKULL: No acute soft tissue abnormality. Hyperostosis of the calvarium, normal variant. No skull fracture. IMPRESSION: 1. No acute intracranial abnormality.  Chronic left basal ganglia infarct. Electronically signed by: Helayne Hurst MD 11/30/2023 11:32 AM EST RP Workstation: [id number]   DG Chest Portable 1 View Result Date: 11/30/2023 CLINICAL DATA:  cough EXAM: PORTABLE CHEST - 1 VIEW COMPARISON:  04/08/2023 FINDINGS: Vague patchy opacity in the right lung base. No pleural effusion or pneumothorax. No cardiomegaly. Aortic atherosclerosis. No acute fracture or destructive lesions. Multilevel thoracic osteophytosis. IMPRESSION: Vague, patchy airspace opacity in the right lung base, possibly representing atelectasis or a developing bronchopneumonia. Electronically Signed   By: Rogelia Myers M.D.   On: 11/30/2023 09:33     Procedures   Medications Ordered in the ED  ipratropium-albuterol  (DUONEB) 0.5-2.5 (3) MG/3ML nebulizer solution 3 mL (has no administration in time range)  albuterol  (PROVENTIL ) (2.5 MG/3ML) 0.083% nebulizer solution 2.5 mg (has no administration in time range)  fentaNYL  (SUBLIMAZE ) injection 50 mcg (50 mcg Intravenous Given 11/30/23 0951)  cefTRIAXone  (ROCEPHIN ) 1 g in sodium chloride  0.9 % 100 mL IVPB (0 g Intravenous Stopped 11/30/23 1417)  doxycycline  (VIBRAMYCIN ) 100 mg in sodium chloride  0.9 % 250 mL  IVPB (0 mg Intravenous Stopped 11/30/23 1417)  ipratropium-albuterol  (DUONEB) 0.5-2.5 (3) MG/3ML nebulizer solution 3 mL (3 mLs Nebulization Given 11/30/23 1041)  sodium chloride  0.9 % bolus 500 mL (0 mLs Intravenous Stopped 11/30/23 1417)  iohexol  (OMNIPAQUE ) 350 MG/ML injection 100 mL (100 mLs Intravenous Contrast Given 11/30/23 1105)  methylPREDNISolone  sodium succinate (SOLU-MEDROL ) 125 mg/2 mL injection 125 mg (125 mg Intravenous Given 11/30/23 1416)  ipratropium-albuterol  (DUONEB) 0.5-2.5 (3) MG/3ML nebulizer solution 3 mL (3 mLs Nebulization Given 11/30/23 1409)    Clinical Course as of 11/30/23 1644  Wed Nov 30, 2023  0906 On my initial exam as EMS was transferring patient from wheelchair to the stretcher, the patient had a blank stare and did not seem to follow any commands.  Maybe had some pill-rolling tremor in her left hand and at times some abnormal jaw movement.  However there were no focal deficits and now she is more awake and alert.  I do not think this is a code stroke, and she is now speaking clearly to me.  Perhaps a seizure based on her medical history.  Will continue to monitor closely. [SG]    Clinical Course User Index [SG] Freddi Hamilton, MD                                 Medical Decision Making Amount and/or Complexity of Data Reviewed Labs: ordered.    Details: Normal troponins x 2 Radiology: ordered and independent  interpretation performed.    Details: No dissection ECG/medicine tests: ordered and independent interpretation performed.    Details: No ischemia  Risk Prescription drug management. Decision regarding hospitalization.   Patient presents mostly with cough and chest pain.  Suspect this is all from COPD exacerbation.  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  Could have been seizure but the movements were irregular and she was also awake during this episode.  CT head is reassuring.  No signs of dissection or obvious pneumonia.  However she has required multiple breathing treatments and Solu-Medrol  and is still wheezing and still requiring a little more oxygen than baseline.  Will need admission.  I did discuss the findings concerning for lung malignancy with her.  Dr. Pearlean will admit.     Final diagnoses:  COPD exacerbation [facility])    ED Discharge Orders     None          Freddi Hamilton, MD 11/30/23 1645

## 2023-11-30 NOTE — ED Triage Notes (Signed)
 Pt BIB EMS from home with reports of chest pain and back pain with reports of pna previously. Pt became unresponsive in triage and would no longer respond verbally. Pt has a hx of stroke and seizures.

## 2023-12-01 ENCOUNTER — Telehealth: Payer: Self-pay | Admitting: Pulmonary Disease

## 2023-12-01 ENCOUNTER — Observation Stay (HOSPITAL_COMMUNITY): Admit: 2023-12-01 | Discharge: 2023-12-01 | Disposition: A | Attending: Internal Medicine

## 2023-12-01 DIAGNOSIS — N3281 Overactive bladder: Secondary | ICD-10-CM | POA: Diagnosis present

## 2023-12-01 DIAGNOSIS — I10 Essential (primary) hypertension: Secondary | ICD-10-CM | POA: Diagnosis not present

## 2023-12-01 DIAGNOSIS — Z79899 Other long term (current) drug therapy: Secondary | ICD-10-CM | POA: Diagnosis not present

## 2023-12-01 DIAGNOSIS — Z1152 Encounter for screening for COVID-19: Secondary | ICD-10-CM | POA: Diagnosis not present

## 2023-12-01 DIAGNOSIS — G4733 Obstructive sleep apnea (adult) (pediatric): Secondary | ICD-10-CM | POA: Diagnosis present

## 2023-12-01 DIAGNOSIS — C7800 Secondary malignant neoplasm of unspecified lung: Secondary | ICD-10-CM | POA: Diagnosis present

## 2023-12-01 DIAGNOSIS — I5030 Unspecified diastolic (congestive) heart failure: Secondary | ICD-10-CM | POA: Diagnosis not present

## 2023-12-01 DIAGNOSIS — R569 Unspecified convulsions: Secondary | ICD-10-CM

## 2023-12-01 DIAGNOSIS — R918 Other nonspecific abnormal finding of lung field: Secondary | ICD-10-CM | POA: Diagnosis not present

## 2023-12-01 DIAGNOSIS — F17210 Nicotine dependence, cigarettes, uncomplicated: Secondary | ICD-10-CM | POA: Diagnosis present

## 2023-12-01 DIAGNOSIS — J441 Chronic obstructive pulmonary disease with (acute) exacerbation: Secondary | ICD-10-CM | POA: Diagnosis present

## 2023-12-01 DIAGNOSIS — R4182 Altered mental status, unspecified: Secondary | ICD-10-CM | POA: Diagnosis not present

## 2023-12-01 DIAGNOSIS — E66813 Obesity, class 3: Secondary | ICD-10-CM | POA: Diagnosis present

## 2023-12-01 DIAGNOSIS — Z8249 Family history of ischemic heart disease and other diseases of the circulatory system: Secondary | ICD-10-CM | POA: Diagnosis not present

## 2023-12-01 DIAGNOSIS — Z9981 Dependence on supplemental oxygen: Secondary | ICD-10-CM | POA: Diagnosis not present

## 2023-12-01 DIAGNOSIS — Z7984 Long term (current) use of oral hypoglycemic drugs: Secondary | ICD-10-CM | POA: Diagnosis not present

## 2023-12-01 DIAGNOSIS — F1410 Cocaine abuse, uncomplicated: Secondary | ICD-10-CM | POA: Diagnosis present

## 2023-12-01 DIAGNOSIS — E1169 Type 2 diabetes mellitus with other specified complication: Secondary | ICD-10-CM | POA: Diagnosis present

## 2023-12-01 DIAGNOSIS — E875 Hyperkalemia: Secondary | ICD-10-CM | POA: Diagnosis present

## 2023-12-01 DIAGNOSIS — J9621 Acute and chronic respiratory failure with hypoxia: Secondary | ICD-10-CM | POA: Diagnosis present

## 2023-12-01 DIAGNOSIS — I5032 Chronic diastolic (congestive) heart failure: Secondary | ICD-10-CM | POA: Diagnosis present

## 2023-12-01 DIAGNOSIS — J432 Centrilobular emphysema: Secondary | ICD-10-CM | POA: Diagnosis present

## 2023-12-01 DIAGNOSIS — E7849 Other hyperlipidemia: Secondary | ICD-10-CM | POA: Diagnosis present

## 2023-12-01 DIAGNOSIS — F4310 Post-traumatic stress disorder, unspecified: Secondary | ICD-10-CM | POA: Diagnosis present

## 2023-12-01 DIAGNOSIS — Z794 Long term (current) use of insulin: Secondary | ICD-10-CM | POA: Diagnosis not present

## 2023-12-01 DIAGNOSIS — I110 Hypertensive heart disease with heart failure: Secondary | ICD-10-CM | POA: Diagnosis present

## 2023-12-01 DIAGNOSIS — G40909 Epilepsy, unspecified, not intractable, without status epilepticus: Secondary | ICD-10-CM | POA: Diagnosis present

## 2023-12-01 DIAGNOSIS — Z6841 Body Mass Index (BMI) 40.0 and over, adult: Secondary | ICD-10-CM | POA: Diagnosis not present

## 2023-12-01 DIAGNOSIS — C781 Secondary malignant neoplasm of mediastinum: Secondary | ICD-10-CM | POA: Diagnosis present

## 2023-12-01 LAB — BASIC METABOLIC PANEL WITH GFR
Anion gap: 13 (ref 5–15)
BUN: 10 mg/dL (ref 6–20)
CO2: 27 mmol/L (ref 22–32)
Calcium: 9.6 mg/dL (ref 8.9–10.3)
Chloride: 99 mmol/L (ref 98–111)
Creatinine, Ser: 0.62 mg/dL (ref 0.44–1.00)
GFR, Estimated: >60 mL/min (ref >60)
Glucose, Bld: 116 mg/dL — ABNORMAL HIGH (ref 70–99)
Potassium: 3.7 mmol/L (ref 3.5–5.1)
Sodium: 138 mmol/L (ref 135–145)

## 2023-12-01 LAB — URINE DRUG SCREEN
Amphetamines: NEGATIVE
Barbiturates: NEGATIVE
Benzodiazepines: NEGATIVE
Cocaine: POSITIVE — AB
Fentanyl: POSITIVE — AB
Methadone Scn, Ur: NEGATIVE
Opiates: NEGATIVE
Tetrahydrocannabinol: NEGATIVE

## 2023-12-01 LAB — CBC
HCT: 44.5 % (ref 36.0–46.0)
Hemoglobin: 13.9 g/dL (ref 12.0–15.0)
MCH: 27.3 pg (ref 26.0–34.0)
MCHC: 31.2 g/dL (ref 30.0–36.0)
MCV: 87.3 fL (ref 80.0–100.0)
Platelets: 332 K/uL (ref 150–400)
RBC: 5.1 MIL/uL (ref 3.87–5.11)
RDW: 16.3 % — ABNORMAL HIGH (ref 11.5–15.5)
WBC: 7.7 K/uL (ref 4.0–10.5)
nRBC: 0 % (ref 0.0–0.2)

## 2023-12-01 LAB — GLUCOSE, CAPILLARY
Glucose-Capillary: 105 mg/dL — ABNORMAL HIGH (ref 70–99)
Glucose-Capillary: 152 mg/dL — ABNORMAL HIGH (ref 70–99)
Glucose-Capillary: 159 mg/dL — ABNORMAL HIGH (ref 70–99)
Glucose-Capillary: 133 mg/dL — ABNORMAL HIGH (ref 70–99)

## 2023-12-01 LAB — HIV ANTIBODY (ROUTINE TESTING W REFLEX): HIV Screen 4th Generation wRfx: NONREACTIVE

## 2023-12-01 MED ORDER — GUAIFENESIN ER 600 MG PO TB12
1200.0000 mg | ORAL_TABLET | Freq: Two times a day (BID) | ORAL | Status: DC
  Administered 2023-12-01 – 2023-12-02 (×3): 1200 mg via ORAL
  Filled 2023-12-01 (×3): qty 2

## 2023-12-01 MED ORDER — HYDROCODONE-ACETAMINOPHEN 5-325 MG PO TABS
1.0000 | ORAL_TABLET | Freq: Four times a day (QID) | ORAL | Status: DC | PRN
  Administered 2023-12-01: 1 via ORAL
  Filled 2023-12-01: qty 1

## 2023-12-01 MED ORDER — CHLORHEXIDINE GLUCONATE CLOTH 2 % EX PADS
6.0000 | MEDICATED_PAD | Freq: Every day | CUTANEOUS | Status: DC
  Administered 2023-12-01: 6 via TOPICAL

## 2023-12-01 MED ORDER — IPRATROPIUM-ALBUTEROL 0.5-2.5 (3) MG/3ML IN SOLN
3.0000 mL | Freq: Four times a day (QID) | RESPIRATORY_TRACT | Status: DC
  Administered 2023-12-01 (×3): 3 mL via RESPIRATORY_TRACT
  Filled 2023-12-01 (×3): qty 3

## 2023-12-01 NOTE — Telephone Encounter (Signed)
 Please schedule the following:  Provider performing procedure: Any provider Diagnosis: Left upper lobe nodule Which side if for nodule / mass?  Left upper lobe nodule-1.9 cm Procedure: Navigational bronchoscopy- Has patient been spoken to by Provider and given informed consent? yes Anesthesia: General Do you need Fluro?  Yes Duration of procedure: 90 minutes Date: Yet to determine Alternate Date: Yet to determine Time:  Location:  Does patient have OSA?  Yes DM?  Yes or Latex allergy?  No Medication Restriction/ Anticoagulate/Antiplatelet: No Pre-op Labs Ordered:determined by Anesthesia Imaging request: CT scan of the chest 11/30/2023 (If, SuperDimension CT Chest, please have STAT courier sent to ENDO)  Patient is currently an inpatient, needs to be scheduled as outpatient Schedule for next available slot with any provider

## 2023-12-01 NOTE — Procedures (Signed)
 Patient Name: Denise [name]  MRN: [id number]  Epilepsy Attending: Arlin MALVA Krebs  Referring Physician/Provider: Pearlean Tully BRAVO, MD  Date: 12/01/2023 Duration: 23.33 mins  Patient history: 53yo F with ams. EEG to evaluate for seizure  Level of alertness: Awake  AEDs during EEG study: CBZ  Technical aspects: This EEG study was done with scalp electrodes positioned according to the 10-20 International system of electrode placement. Electrical activity was reviewed with band pass filter of 1-70Hz , sensitivity of 7 uV/mm, display speed of 88mm/sec with a 60Hz  notched filter applied as appropriate. EEG data were recorded continuously and digitally stored.  Video monitoring was available and reviewed as appropriate.  Description: The posterior dominant rhythm consists of 8 Hz activity of moderate voltage (25-35 uV) seen predominantly in posterior head regions, symmetric and reactive to eye opening and eye closing. Hyperventilation and photic stimulation were not performed.     IMPRESSION: This study is within normal limits. No seizures or epileptiform discharges were seen throughout the recording.  A normal interictal EEG does not exclude the diagnosis of epilepsy.   Pierina Schuknecht [place]

## 2023-12-01 NOTE — Progress Notes (Signed)
 PROGRESS NOTE    Denise [name]  FMW:[id number] DOB: July 08, 1955 DOA: 11/30/2023 PCP: Duwaine Annabella SAILOR, FNP   Brief Narrative: 53F past medical history significant for COPD, chronic respiratory failure on 2 L of oxygen, hypertension, alcohol and cocaine abuse, diabetes, OSA on CPAP, stroke presents complaining of back pain radiating to the front, associated with cough and difficulty breathing.  Patient while in the ED appears to have had altered mental status, not responding or talking, intermittent jaw movement episode lasted for 2 minutes.  Thought to have had seizure spell.  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     Assessment & Plan:   Principal Problem:   COPD with acute exacerbation (HCC) Active Problems:   Type 2 diabetes mellitus   Chronic diastolic CHF (congestive heart failure)   Essential hypertension   OSA on CPAP   CVA (cerebrovascular accident)   Centrilobular emphysema (HCC)   Chronic respiratory failure with hypoxia (HCC)   Metastatic disease (HCC)   1-COPD exacerbation: Acute on chronic Hypoxic Resp failure. 2 L oxygen at home - Continue with Doxy - Continue DuoNebs - Continue Breztri  - Added guaifenesin  twice daily - Continue IV Solu-Medrol  and subsequently transition to oral prednisone  -currently on 4 L oxygen.   Seizure disorder, - Had an episode of unconsciousness, nonverbal, - CT head: No acute intracranial abnormality, chronic left basal ganglia infarct. - Continue Tegretol   Thoracic malignancy Lung Mass CT chest showed evidence of advanced thoracic malignancy, with mediastinal ex nodal disease and diffuse pulmonary metastasis.  Possible posterior left upper lobe lung primary (1.9 cm spiculated posterior left upper lobe lung mass adjacent to the major fissure) - Pulmonology is consulted for evaluation for bronchoscopy and  biopsy  Chest pain back pain - In the setting of lung mass and mediastinal metastasis -Pain management with Vicodin.  Diastolic heart failure: - Continue Lasix   Diabetes type 2: Continue sliding scale insulin   Substance abuse: Counseling  OSA on CPAP  Hyperkalemia: Resolved      Estimated body mass index is 98.98 kg/m as calculated from the following:   Height as of this encounter: 5' 5 (1.651 m).   Weight as of this encounter: 269.8 kg.   DVT prophylaxis: SCD, Lovenox .  Code Status: Full code Family Communication: Care discussed with patient  Disposition Plan:  Status is: Observation The patient will require care spanning > 2 midnights and should be moved to inpatient because: management of COPD    Consultants:  Pulmonologist  Procedures:  none  Antimicrobials:    Subjective: She is breathing better than yesterday, not breathing at baseline. Report cough. Dyspnea. Back pain.   Objective: Vitals:   12/01/23 0200 12/01/23 0300 12/01/23 0400 12/01/23 0700  BP: (!) 157/75 (!) 160/81 (!) 147/61 (!) 177/82  Pulse: 72 70 68 72  Resp: 14 15 15 16   Temp:   98 F (36.7 C)   TempSrc:   Axillary   SpO2: 98% 96% 92% (!) 88%  Weight:      Height:        Intake/Output Summary (Last 24 hours) at 12/01/2023 0803 Last data filed at 12/01/2023 0016 Gross per 24 hour  Intake 1100.02 ml  Output --  Net 1100.02 ml   Filed Weights   11/30/23 2033  Weight: (!) 269.8 kg    Examination:  General exam: Appears calm and comfortable  Respiratory system: BL ronchus.  Respiratory effort normal. Cardiovascular  system: S1 & S2 heard, RRR. No JVD, murmurs, rubs, gallops or clicks. No pedal edema. Gastrointestinal system: Abdomen is nondistended, soft and nontender. No organomegaly or masses felt. Normal bowel sounds heard. Central nervous system: Alert and oriented. Extremities: Symmetric 5 x 5 power.     Data Reviewed: I have personally reviewed following labs  and imaging studies  CBC: Recent Labs  Lab 11/30/23 0908 11/30/23 1011 12/01/23 0328  WBC 8.5  --  7.7  NEUTROABS 4.9  --   --   HGB 15.3* 16.3* 13.9  HCT 48.1* 48.0* 44.5  MCV 86.2  --  87.3  PLT 358  --  332   Basic Metabolic Panel: Recent Labs  Lab 11/30/23 0908 11/30/23 1011 12/01/23 0328  NA 138 136 138  K 3.8 5.9* 3.7  CL 96* 97* 99  CO2 28  --  27  GLUCOSE 125* 119* 116*  BUN 8 9 10   CREATININE 0.69 0.80 0.62  CALCIUM  9.8  --  9.6  MG 1.8  --   --    GFR: Estimated Creatinine Clearance: 178.2 mL/min (by C-G formula based on SCr of 0.62 mg/dL). Liver Function Tests: Recent Labs  Lab 11/30/23 0908  AST 23  ALT 21  ALKPHOS 151*  BILITOT 0.4  PROT 7.8  ALBUMIN 4.5   Recent Labs  Lab 11/30/23 0908  LIPASE 13   No results for input(s): AMMONIA in the last 168 hours. Coagulation Profile: No results for input(s): INR, PROTIME in the last 168 hours. Cardiac Enzymes: No results for input(s): CKTOTAL, CKMB, CKMBINDEX, TROPONINI in the last 168 hours. BNP (last 3 results) Recent Labs    11/30/23 0908  PROBNP <50.0   HbA1C: No results for input(s): HGBA1C in the last 72 hours. CBG: Recent Labs  Lab 11/30/23 0859 11/30/23 2153 12/01/23 0724  GLUCAP 137* 137* 105*   Lipid Profile: No results for input(s): CHOL, HDL, LDLCALC, TRIG, CHOLHDL, LDLDIRECT in the last 72 hours. Thyroid  Function Tests: No results for input(s): TSH, T4TOTAL, FREET4, T3FREE, THYROIDAB in the last 72 hours. Anemia Panel: No results for input(s): VITAMINB12, FOLATE, FERRITIN, TIBC, IRON, RETICCTPCT in the last 72 hours. Sepsis Labs: No results for input(s): PROCALCITON, LATICACIDVEN in the last 168 hours.  Recent Results (from the past 240 hours)  Resp panel by RT-PCR (RSV, Flu A&B, Covid) Anterior Nasal Swab     Status: None   Collection Time: 11/30/23  9:09 AM   Specimen: Anterior Nasal Swab  Result Value Ref Range  Status   SARS Coronavirus 2 by RT PCR NEGATIVE NEGATIVE Final    Comment: (NOTE) SARS-CoV-2 target nucleic acids are NOT DETECTED.  The SARS-CoV-2 RNA is generally detectable in upper respiratory specimens during the acute phase of infection. The lowest concentration of SARS-CoV-2 viral copies this assay can detect is 138 copies/mL. A negative result does not preclude SARS-Cov-2 infection and should not be used as the sole basis for treatment or other patient management decisions. A negative result may occur with  improper specimen collection/handling, submission of specimen other than nasopharyngeal swab, presence of viral mutation(s) within the areas targeted by this assay, and inadequate number of viral copies(<138 copies/mL). A negative result must be combined with clinical observations, patient history, and epidemiological information. The expected result is Negative.  Fact Sheet for Patients:  bloggercourse.com  Fact Sheet for Healthcare Providers:  seriousbroker.it  This test is no t yet approved or cleared by the [place]  FDA and  has been authorized for  detection and/or diagnosis of SARS-CoV-2 by FDA under an Emergency Use Authorization (EUA). This EUA will remain  in effect (meaning this test can be used) for the duration of the COVID-19 declaration under Section 564(b)(1) of the Act, 21 U.S.C.section 360bbb-3(b)(1), unless the authorization is terminated  or revoked sooner.       Influenza A by PCR NEGATIVE NEGATIVE Final   Influenza B by PCR NEGATIVE NEGATIVE Final    Comment: (NOTE) The Xpert Xpress SARS-CoV-2/FLU/RSV plus assay is intended as an aid in the diagnosis of influenza from Nasopharyngeal swab specimens and should not be used as a sole basis for treatment. Nasal washings and aspirates are unacceptable for Xpert Xpress SARS-CoV-2/FLU/RSV testing.  Fact Sheet for  Patients: bloggercourse.com  Fact Sheet for Healthcare Providers: seriousbroker.it  This test is not yet approved or cleared by the [place]  FDA and has been authorized for detection and/or diagnosis of SARS-CoV-2 by FDA under an Emergency Use Authorization (EUA). This EUA will remain in effect (meaning this test can be used) for the duration of the COVID-19 declaration under Section 564(b)(1) of the Act, 21 U.S.C. section 360bbb-3(b)(1), unless the authorization is terminated or revoked.     Resp Syncytial Virus by PCR NEGATIVE NEGATIVE Final    Comment: (NOTE) Fact Sheet for Patients: bloggercourse.com  Fact Sheet for Healthcare Providers: seriousbroker.it  This test is not yet approved or cleared by the [place]  FDA and has been authorized for detection and/or diagnosis of SARS-CoV-2 by FDA under an Emergency Use Authorization (EUA). This EUA will remain in effect (meaning this test can be used) for the duration of the COVID-19 declaration under Section 564(b)(1) of the Act, 21 U.S.C. section 360bbb-3(b)(1), unless the authorization is terminated or revoked.  Performed at [facility], [address] [place], [place] [address]          Radiology Studies: CT Angio Chest/Abd/Pel for Dissection W and/or Wo Contrast Result Date: 11/30/2023 EXAM: CTA CHEST, ABDOMEN AND PELVIS WITH AND WITHOUT CONTRAST 11/30/2023 11:27:02 AM TECHNIQUE: CTA of the chest was performed with and without the administration of intravenous contrast. CTA of the abdomen and pelvis was performed with and without the administration of intravenous contrast. 100 mL of iohexol  (OMNIPAQUE ) 350 MG/ML injection was administered. Multiplanar reformatted images are provided for review. MIP images are provided for review. Automated exposure control, iterative reconstruction, and/or weight  based adjustment of the mA/kV was utilized to reduce the radiation dose to as low as reasonably achievable. COMPARISON: CTA chest 07/29/2021. CT abdomen and pelvis 06/23/2023. CLINICAL HISTORY: 53 year old female. Acute aortic syndrome (AAS) suspected. FINDINGS: VASCULATURE: AORTA: Calcified thoracic aorta atherosclerosis. Abdominal aortic calcified atherosclerosis. No dissection. No abdominal aortic aneurysm. Central pulmonary arteries appear to be patent. Iliac artery calcified atherosclerosis. No occlusion or significant stenosis. Proximal femoral artery calcified atherosclerosis. Major arterial structures in the abdomen and pelvis are patent. CHEST: MEDIASTINUM: Heart size remains normal. No pericardial effusion. Infiltrative appearing mediastinal soft tissue which is likely metastatic ex nodal disease (series 7 image 29). Other discrete superior mediastinum, prevascular space lymphadenopathy measuring 12 mm short axis (the same node was 3 to 4 mm in 2023). Associated left hilar lymph nodes are 9 mm short axis. LUNGS AND PLEURA: 1.9 cm spiculated posterior left upper lobe lung mass adjacent to the major fissure on series 6 image 40, possibly representing a primary lung malignancy. Superimposed diffuse pulmonary nodularity in all lobes, innumerable lung nodules most compatible with metastatic disease, many between 5  and 10 mm in size. No evidence of pleural effusion or pneumothorax. THORACIC BONES AND SOFT TISSUES: No acute or suspicious osseous lesion identified by CT in the chest. No acute soft tissue abnormality. ABDOMEN AND PELVIS: LIVER: Early postcontrast appearance of the liver with interval limits. On delayed phase images, the visible more delayed postcontrast appearance of the liver is within normal limits. GALLBLADDER AND BILE DUCTS: Early postcontrast appearance of the gallbladder with interval limits. On delayed phase images, the visible more delayed postcontrast appearance of the gallbladder is  within normal limits. No biliary ductal dilatation. SPLEEN: Early postcontrast appearance of the spleen with interval limits. On delayed phase images, the visible more delayed postcontrast appearance of the spleen is within normal limits. PANCREAS: Early postcontrast appearance of the pancreas with interval limits. On delayed phase images, the visible more delayed postcontrast appearance of the pancreas is within normal limits. ADRENAL GLANDS: Bilateral adrenal glands appear normal. KIDNEYS, URETERS AND BLADDER: Kidneys appear nonobstructed and with interval limits on early postcontrast images. On delayed phase images, there is symmetric and normal renal contrast excretion from both kidneys. No stones in the kidneys or ureters. No hydronephrosis. No perinephric or periureteral stranding. Urinary bladder is unremarkable. GI AND BOWEL: Nondilated bowel loops. Diminutive, normal appendix on series 8 image 35. There is no bowel obstruction. No abnormal bowel wall thickening or distension. REPRODUCTIVE: Calcified uterine fundal fibroids, up to 4 cm diameter, stable. Incidental pelvic phleboliths. PERITONEUM AND RETROPERITONEUM: Stable circumscribed and simple fluid attenuation cystic lesion adjacent to the distal abdominal aorta and IVC, probably benign and perhaps lymphatic in nature. No ascites or free air. LYMPH NODES: No lymphadenopathy identified in the abdomen or pelvis. ABDOMINAL BONES AND SOFT TISSUES: No acute or suspicious osseous lesion identified in the abdomen or pelvis. IMPRESSION: 1. 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 Possible posterior left upper lobe lung primary (1.9 cm). 2. Negative for aortic dissection or aneurysm. Positive for aortic atherosclerosis. 3. No metastatic disease identified in the abdomen or pelvis Electronically signed by: Helayne Hurst MD 11/30/2023 11:45 AM EST RP Workstation: [id number]   CT Head Wo  Contrast Result Date: 11/30/2023 EXAM: CT HEAD WITHOUT CONTRAST 11/30/2023 11:27:02 AM TECHNIQUE: CT of the head was performed without the administration of intravenous contrast. Automated exposure control, iterative reconstruction, and/or weight based adjustment of the mA/kV was utilized to reduce the radiation dose to as low as reasonably achievable. COMPARISON: Brain MRI 08/20/2021, head CT 09/25/2019. CLINICAL HISTORY: 53 year old female. Mental status change, unknown cause. FINDINGS: BRAIN AND VENTRICLES: No acute hemorrhage. Chronic lacunar infarct anterior left basal ganglia is stable from priors. Mild ex vacuo enlargement of the left frontal horn. Otherwise normal brain volume, ventricle size and configuration. Normal gray white differentiation elsewhere. No hydrocephalus. No extra-axial collection. No mass effect or midline shift. Calcified atherosclerosis at the skull base. No suspicious intracranial vascular hyperdensity. ORBITS: No acute abnormality. No gaze deviation. SINUSES: Paranasal sinuses, middle ears and mastoids remain well aerated. SOFT TISSUES AND SKULL: No acute soft tissue abnormality. Hyperostosis of the calvarium, normal variant. No skull fracture. IMPRESSION: 1. No acute intracranial abnormality.  Chronic left basal ganglia infarct. Electronically signed by: Helayne Hurst MD 11/30/2023 11:32 AM EST RP Workstation: [id number]   DG Chest Portable 1 View Result Date: 11/30/2023 CLINICAL DATA:  cough EXAM: PORTABLE CHEST - 1 VIEW COMPARISON:  04/08/2023 FINDINGS: Vague patchy opacity in the right lung base. No pleural effusion or pneumothorax. No cardiomegaly. Aortic atherosclerosis.  No acute fracture or destructive lesions. Multilevel thoracic osteophytosis. IMPRESSION: Vague, patchy airspace opacity in the right lung base, possibly representing atelectasis or a developing bronchopneumonia. Electronically Signed   By: Rogelia Myers M.D.   On: 11/30/2023 09:33        Scheduled  Meds:  amLODipine   10 mg Oral Daily   atorvastatin   20 mg Oral Daily   budesonide -glycopyrrolate -formoterol   2 puff Inhalation BID   carbamazepine   400 mg Oral BID   Chlorhexidine Gluconate Cloth  6 each Topical Daily   enoxaparin  (LOVENOX ) injection  120 mg Subcutaneous Q24H   furosemide   80 mg Oral Daily   insulin  aspart  0-15 Units Subcutaneous TID WC   insulin  aspart  0-5 Units Subcutaneous QHS   ipratropium-albuterol   3 mL Nebulization Q12H   methylPREDNISolone  (SOLU-MEDROL ) injection  60 mg Intravenous Q12H   Followed by   NOREEN ON 12/02/2023] predniSONE   40 mg Oral Q breakfast   pantoprazole   40 mg Oral Daily   Continuous Infusions:  doxycycline  (VIBRAMYCIN ) IV Stopped (12/01/23 0010)     LOS: 0 days    Time spent: 35 minutes    Milaina Sher A Cieanna Stormes, MD [facility]   If 7PM-7AM, please contact night-coverage [email or web address]  12/01/2023, 8:03 AM

## 2023-12-01 NOTE — Consult Note (Signed)
 NAME:  Denise [name], MRN:  [id number], DOB:  15-Sep-1970, LOS: 0 ADMISSION DATE:  11/30/2023, CONSULTATION DATE: 12/01/2023 REFERRING MD: Dr. Madelyne, CHIEF COMPLAINT: Shortness of breath, COPD exacerbation, lung nodule  History of Present Illness:  53 year old with history of COPD, chronic respiratory failure, obstructive sleep apnea on CPAP, previous stroke Came in with worsening shortness of breath increasing cough with mucus production  She did have some chest discomfort radiating to the back  An active smoker about half a pack a day  CT performed during evaluation revealed a left upper lobe mass with metastatic nodules.  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   Chronic diastolic CHF (congestive heart failure) 07/29/2021   Class 3 obesity with alveolar hypoventilation, serious comorbidity, and body mass index (BMI) of 50.0 to 59.9 in adult 06/12/2020   CVA (cerebrovascular accident) 2017   2017 MRI - chronic left lenticulostriate territory hemorrhage or hemorrhagic infarct, with hemosiderin, and encephalomalacia affecting both the caudate and lentiform nucleus     Decreased mobility 06/15/2021   DOE (dyspnea on exertion) 06/12/2020   Essential hypertension 12/18/2014   Fibroid uterus 12/18/2014   Heart murmur, systolic 06/29/2021   History of alcohol abuse 02/16/2017   History of cocaine use 02/16/2017   History of latent syphilis 02/16/2017   Hyperlipidemia associated with type 2 diabetes mellitus 11/07/2020   Incontinence of urine    Lacunar infarction    MRI - chronic lacunar infarct within the posterior right lentiform nucleus.     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   PTSD (post-traumatic stress disorder)    Pulmonary emphysema 08/31/2021   QT prolongation 06/29/2021   Right sided weakness    Social anxiety disorder 11/03/2017   Syncope 02/07/2015   TIA (transient ischemic attack)    Tobacco use 04/14/2015   Type 2 diabetes mellitus 10/21/2017   Venous stasis dermatitis of both lower extremities 06/15/2021   Significant Hospital Events: Including procedures, antibiotic start and stop dates in addition to other pertinent events   CT chest with 1.9 cm nodule left upper lobe, multiple metastatic deposits  Interim History / Subjective:  Shortness of breath, cough, mucus production Denies any chest pains at present Has not had any fevers  Objective    Blood pressure (!) 153/66, pulse 87, temperature 98 F (36.7 C), temperature source Oral, resp. rate 20, height 5' 5 (1.651 m), weight (!) 269.8 kg, last menstrual period 05/29/2017, SpO2 91%.        Intake/Output Summary (Last 24 hours) at 12/01/2023 0936 Last data filed at 12/01/2023 0016 Gross per 24 hour  Intake 1100.02 ml  Output --  Net 1100.02 ml   Filed Weights   11/30/23 2033  Weight: (!) 269.8 kg    Examination: General: Middle-aged, does not appear to be in distress HENT: Moist oral mucosa Lungs: Decreased air movement bilaterally secondary to body habitus Cardiovascular: S1-S2 appreciated Abdomen:  Soft, bowel sounds appreciated Extremities: No clubbing, no edema Neuro: Awake alert oriented, nonfocal exam GU: Fair output  I reviewed last 24 h vitals and pain scores, last 48 h intake and output, last 24 h labs and trends, and last 24 h imaging results. CT scan was reviewed by myself  WBC 7.7, hematocrit of  44.5 BUN of 8, creatinine of 0.69  Resolved problem list   Assessment and Plan   Lung nodule with possible metastatic deposits - Will require bronchoscopy -Navigational bronchoscopy -Will place a case request  Navigational bronchoscopy will be done as an outpatient  Chronic obstructive pulmonary disease with exacerbation - Continue bronchodilators - Continue antibiotic therapy -May switch steroids to oral steroids - Switch to oral antibiotics  Diastolic congestive heart failure - Continue cautious diuresis  Hypertension - Continue home medications  Diabetes - Continue SSI  Continue CPAP for OSA   I have placed a request for patient to be scheduled for outpatient bronchoscopy Date and time yet to be determined  Will update with schedule  Jennet Epley, MD [facility] Pager: See Tracey

## 2023-12-01 NOTE — Telephone Encounter (Signed)
 Hey Dr. Neda I need a case request, a provider and a date that the procedure needs to be done. Please advise

## 2023-12-01 NOTE — Progress Notes (Signed)
 EEG complete - results pending

## 2023-12-01 NOTE — Plan of Care (Incomplete)

## 2023-12-01 NOTE — Progress Notes (Signed)
   12/01/23 0110  BiPAP/CPAP/SIPAP  $ Non-Invasive Home Ventilator  Initial  $ Face Mask Medium Yes  BiPAP/CPAP/SIPAP Pt Type Adult  BiPAP/CPAP/SIPAP DREAMSTATIOND  Mask Type Full face mask  Dentures removed? Not applicable  Mask Size Medium  Set Rate 0 breaths/min  Respiratory Rate 18 breaths/min  Patient Home Machine No  Patient Home Mask No  Patient Home Tubing No  Auto Titrate Yes  Minimum cmH2O 5 cmH2O  Maximum cmH2O 20 cmH2O  Device Plugged into RED Power Outlet Yes

## 2023-12-01 NOTE — Progress Notes (Signed)
   12/01/23 2300  BiPAP/CPAP/SIPAP  BiPAP/CPAP/SIPAP Pt Type Adult  BiPAP/CPAP/SIPAP DREAMSTATIOND  Mask Type Full face mask  Dentures removed? Not applicable  Mask Size Medium  Set Rate 0 breaths/min  Respiratory Rate 18 breaths/min  Patient Home Machine No  Patient Home Mask No  Patient Home Tubing No  Auto Titrate Yes  Minimum cmH2O 5 cmH2O  Maximum cmH2O 20 cmH2O  Device Plugged into RED Power Outlet Yes

## 2023-12-01 NOTE — Plan of Care (Signed)

## 2023-12-02 DIAGNOSIS — J441 Chronic obstructive pulmonary disease with (acute) exacerbation: Secondary | ICD-10-CM | POA: Diagnosis not present

## 2023-12-02 LAB — GLUCOSE, CAPILLARY: Glucose-Capillary: 119 mg/dL — ABNORMAL HIGH (ref 70–99)

## 2023-12-02 MED ORDER — PANTOPRAZOLE SODIUM 40 MG PO TBEC: 40.0000 mg | DELAYED_RELEASE_TABLET | Freq: Every day | ORAL | 0 refills | Status: DC

## 2023-12-02 MED ORDER — PREDNISONE 20 MG PO TABS: 40.0000 mg | ORAL_TABLET | Freq: Every day | ORAL | 0 refills | Status: AC

## 2023-12-02 MED ORDER — DOXYCYCLINE HYCLATE 100 MG PO TABS: 100.0000 mg | ORAL_TABLET | Freq: Two times a day (BID) | ORAL | 0 refills | Status: AC

## 2023-12-02 MED ORDER — GUAIFENESIN ER 600 MG PO TB12: 1200.0000 mg | ORAL_TABLET | Freq: Two times a day (BID) | ORAL | 0 refills | Status: DC

## 2023-12-02 MED ORDER — NICOTINE 14 MG/24HR TD PT24: MEDICATED_PATCH | TRANSDERMAL | 0 refills | Status: DC

## 2023-12-02 MED ORDER — IPRATROPIUM-ALBUTEROL 0.5-2.5 (3) MG/3ML IN SOLN
3.0000 mL | Freq: Three times a day (TID) | RESPIRATORY_TRACT | Status: DC
  Administered 2023-12-02: 3 mL via RESPIRATORY_TRACT
  Filled 2023-12-02: qty 3

## 2023-12-02 NOTE — Discharge Summary (Signed)
 Physician Discharge Summary   Patient: Denise [name] MRN: [id number] DOB: [birth year]  Admit date:     11/30/2023  Discharge date: 12/02/23  Discharge Physician: Owen DELENA Lore   PCP: Duwaine Annabella SAILOR, FNP   Recommendations at discharge:    Needs close follow up with Pulmonologist for Bronchoscopy for evaluation of Lung Mass Follow up with Pulmonologist for further management of COPD Follow up wit PCP for management of Chronic medical problems.   Discharge Diagnoses: Principal Problem:   COPD with acute exacerbation (HCC) Active Problems:   Type 2 diabetes mellitus   Chronic diastolic CHF (congestive heart failure)   Essential hypertension   OSA on CPAP   CVA (cerebrovascular accident)   Centrilobular emphysema (HCC)   Chronic respiratory failure with hypoxia (HCC)   Metastatic disease (HCC)  Resolved Problems:   * No resolved hospital problems. [facility] Course: 53F past medical history significant for COPD, chronic respiratory failure on 2 L of oxygen, hypertension, alcohol and cocaine abuse, diabetes, OSA on CPAP, stroke presents complaining of back pain radiating to the front, associated with cough and difficulty breathing.  Patient while in the ED appears to have had altered mental status, not responding or talking, intermittent jaw movement episode lasted for 2 minutes.  Thought to have had seizure spell.  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      Assessment and Plan: 1-COPD exacerbation: Acute on chronic Hypoxic Resp failure. 2 L oxygen at home - Continue with Doxy - Continue DuoNebs - Continue Breztri  - Continue with  guaifenesin  twice daily -Treated with  IV Solu-Medrol  and subsequently transition to oral prednisone  -She has been wean down to 2 L.  Feels breathing at baseline. Plan to discharge on Prednisone  for 5 days, Doxycycline  and  resume home inhaler.    Seizure disorder, - Had an episode of unconsciousness, nonverbal, - CT head: No acute intracranial abnormality, chronic left basal ganglia infarct. - Continue Tegretol  EEG negative.  Exacerbation in setting drugs use and acute Hypoxic resp failure  Thoracic malignancy Lung Mass CT chest showed evidence of advanced thoracic malignancy, with mediastinal ex nodal disease and diffuse pulmonary metastasis.  Possible posterior left upper lobe lung primary (1.9 cm spiculated posterior left upper lobe lung mass adjacent to the major fissure) - Pulmonology is consulted for evaluation for bronchoscopy and biopsy Plan for out patient bronchoscopy, appreciate pulmonologist assistance.   Chest pain back pain - In the setting of lung mass and mediastinal metastasis -   Diastolic heart failure: - Continue Lasix  and Potassium    Diabetes type 2: Continue sliding scale insulin  Resume Metformin  at discharge.   Substance abuse: Counseling   OSA on CPAP   Hyperkalemia: Resolved           Consultants: Pulmonologist  Procedures performed: EEG Disposition: Home Diet recommendation:  Discharge Diet Orders (From admission, onward)     Start     Ordered   12/02/23 0000  Diet Carb Modified        12/02/23 0822           Carb modified diet DISCHARGE MEDICATION: Allergies as of 12/02/2023       Reactions   Gadolinium Derivatives Nausea And Vomiting, Other (See Comments)   Nausea and vomiting was followed by epileptic seizure episode that lasted approximately 5 minutes; pt was unable to verbally communicate during that time; 53 then came to and was  able to speak; was evaluated by Rad and RN; kms    Grapefruit Flavor [flavoring Agent (non-screening)] Other (See Comments)   Drug interaction        Medication List     STOP taking these medications    Bayer Back & Body Pain Ex St 500-32.5 MG Tabs Generic drug: Aspirin -Caffeine        TAKE these  medications    amLODipine  10 MG tablet Commonly known as: NORVASC  Take 10 mg by mouth daily.   atorvastatin  20 MG tablet Commonly known as: LIPITOR Take 1 tablet (20 mg total) by mouth daily. Pt needs to keep upcoming appt in July for further refills   Breztri  Aerosphere 160-9-4.8 MCG/ACT Aero inhaler Generic drug: budesonide -glycopyrrolate -formoterol  Inhale 2 puffs into the lungs in the morning and at bedtime.   buPROPion  150 MG 12 hr tablet Commonly known as: Wellbutrin  SR 150 mg p.o. daily for 7 days then 150 mg p.o. twice daily for 1 month   carbamazepine  200 MG tablet Commonly known as: TEGRETOL  TAKE 2 TABLETS BY MOUTH 2 TIMES DAILY   doxycycline  100 MG tablet Commonly known as: VIBRA -TABS Take 1 tablet (100 mg total) by mouth 2 (two) times daily for 5 days.   furosemide  80 MG tablet Commonly known as: LASIX  TAKE 1 TABLET BY MOUTH EVERY DAY   gabapentin  300 MG capsule Commonly known as: NEURONTIN  TAKE 3 CAPSULES BY MOUTH 3 TIMES DAILY   guaiFENesin  600 MG 12 hr tablet Commonly known as: MUCINEX  Take 2 tablets (1,200 mg total) by mouth 2 (two) times daily.   latanoprost 0.005 % ophthalmic solution Commonly known as: XALATAN Apply 1 drop to eye.   lisinopril  30 MG tablet Commonly known as: ZESTRIL  Take 30 mg by mouth daily.   metFORMIN  500 MG tablet Commonly known as: GLUCOPHAGE  Take 500 mg by mouth 2 (two) times daily.   nicotine  14 mg/24hr patch Commonly known as: NICODERM CQ  - dosed in mg/24 hours RX #2 Weeks 5-6: 14 mg x 1 patch daily. Wear for 24 hours. If you have sleep disturbances, remove at bedtime.   Olopatadine HCl 0.2 % Soln Apply 1 drop to eye.   pantoprazole  40 MG tablet Commonly known as: PROTONIX  Take 1 tablet (40 mg total) by mouth daily.   potassium chloride  SA 20 MEQ tablet Commonly known as: KLOR-CON  M Take 1 tablet (20 mEq total) by mouth daily.   predniSONE  20 MG tablet Commonly known as: DELTASONE  Take 2 tablets (40 mg  total) by mouth daily with breakfast for 5 days. Start taking on: December 03, 2023   senna-docusate 8.6-50 MG tablet Commonly known as: Senokot-S Take 1 tablet by mouth at bedtime as needed for mild constipation.   Systane Ultra 0.4-0.3 % Soln Generic drug: Polyethyl Glycol-Propyl Glycol Apply 1 drop to eye.   Ventolin  HFA 108 (90 Base) MCG/ACT inhaler Generic drug: albuterol  inhale 2 PUFFS into THE lungs EVERY 6 HOURS AS NEEDED FOR WHEEZING OR SHORTNESS OF BREATH What changed: when to take this        Discharge Exam: Filed Weights   11/30/23 2033  Weight: (!) 269.8 kg   General NAD Lung CTA Feels breathing at baseline. Feels ready to go home  Condition at discharge: stable  The results of significant diagnostics from this hospitalization (including imaging, microbiology, ancillary and laboratory) are listed below for reference.   Imaging Studies: EEG adult Result Date: 12/01/2023 Shelton Arlin KIDD, MD     12/01/2023  3:26 PM Patient Name: Berwyn  Humphrey-Headen MRN: [id number] Epilepsy Attending: Arlin MALVA Krebs Referring Physician/Provider: Pearlean Tully BRAVO, MD Date: 12/01/2023 Duration: 23.33 mins Patient history: 53yo F with ams. EEG to evaluate for seizure Level of alertness: Awake AEDs during EEG study: CBZ Technical aspects: This EEG study was done with scalp electrodes positioned according to the 10-20 International system of electrode placement. Electrical activity was reviewed with band pass filter of 1-70Hz , sensitivity of 7 uV/mm, display speed of 41mm/sec with a 60Hz  notched filter applied as appropriate. EEG data were recorded continuously and digitally stored.  Video monitoring was available and reviewed as appropriate. Description: The posterior dominant rhythm consists of 8 Hz activity of moderate voltage (25-35 uV) seen predominantly in posterior head regions, symmetric and reactive to eye opening and eye closing. Hyperventilation and photic stimulation were  not performed.   IMPRESSION: This study is within normal limits. No seizures or epileptiform discharges were seen throughout the recording. A normal interictal EEG does not exclude the diagnosis of epilepsy. Priyanka O Yadav   CT Angio Chest/Abd/Pel for Dissection W and/or Wo Contrast Result Date: 11/30/2023 EXAM: CTA CHEST, ABDOMEN AND PELVIS WITH AND WITHOUT CONTRAST 11/30/2023 11:27:02 AM TECHNIQUE: CTA of the chest was performed with and without the administration of intravenous contrast. CTA of the abdomen and pelvis was performed with and without the administration of intravenous contrast. 100 mL of iohexol  (OMNIPAQUE ) 350 MG/ML injection was administered. Multiplanar reformatted images are provided for review. MIP images are provided for review. Automated exposure control, iterative reconstruction, and/or weight based adjustment of the mA/kV was utilized to reduce the radiation dose to as low as reasonably achievable. COMPARISON: CTA chest 07/29/2021. CT abdomen and pelvis 06/23/2023. CLINICAL HISTORY: 53 year old female. Acute aortic syndrome (AAS) suspected. FINDINGS: VASCULATURE: AORTA: Calcified thoracic aorta atherosclerosis. Abdominal aortic calcified atherosclerosis. No dissection. No abdominal aortic aneurysm. Central pulmonary arteries appear to be patent. Iliac artery calcified atherosclerosis. No occlusion or significant stenosis. Proximal femoral artery calcified atherosclerosis. Major arterial structures in the abdomen and pelvis are patent. CHEST: MEDIASTINUM: Heart size remains normal. No pericardial effusion. Infiltrative appearing mediastinal soft tissue which is likely metastatic ex nodal disease (series 7 image 29). Other discrete superior mediastinum, prevascular space lymphadenopathy measuring 12 mm short axis (the same node was 3 to 4 mm in 2023). Associated left hilar lymph nodes are 9 mm short axis. LUNGS AND PLEURA: 1.9 cm spiculated posterior left upper lobe lung mass adjacent to  the major fissure on series 6 image 40, possibly representing a primary lung malignancy. Superimposed diffuse pulmonary nodularity in all lobes, innumerable lung nodules most compatible with metastatic disease, many between 5 and 10 mm in size. No evidence of pleural effusion or pneumothorax. THORACIC BONES AND SOFT TISSUES: No acute or suspicious osseous lesion identified by CT in the chest. No acute soft tissue abnormality. ABDOMEN AND PELVIS: LIVER: Early postcontrast appearance of the liver with interval limits. On delayed phase images, the visible more delayed postcontrast appearance of the liver is within normal limits. GALLBLADDER AND BILE DUCTS: Early postcontrast appearance of the gallbladder with interval limits. On delayed phase images, the visible more delayed postcontrast appearance of the gallbladder is within normal limits. No biliary ductal dilatation. SPLEEN: Early postcontrast appearance of the spleen with interval limits. On delayed phase images, the visible more delayed postcontrast appearance of the spleen is within normal limits. PANCREAS: Early postcontrast appearance of the pancreas with interval limits. On delayed phase images, the visible more delayed postcontrast appearance of the pancreas is  within normal limits. ADRENAL GLANDS: Bilateral adrenal glands appear normal. KIDNEYS, URETERS AND BLADDER: Kidneys appear nonobstructed and with interval limits on early postcontrast images. On delayed phase images, there is symmetric and normal renal contrast excretion from both kidneys. No stones in the kidneys or ureters. No hydronephrosis. No perinephric or periureteral stranding. Urinary bladder is unremarkable. GI AND BOWEL: Nondilated bowel loops. Diminutive, normal appendix on series 8 image 35. There is no bowel obstruction. No abnormal bowel wall thickening or distension. REPRODUCTIVE: Calcified uterine fundal fibroids, up to 4 cm diameter, stable. Incidental pelvic phleboliths. PERITONEUM  AND RETROPERITONEUM: Stable circumscribed and simple fluid attenuation cystic lesion adjacent to the distal abdominal aorta and IVC, probably benign and perhaps lymphatic in nature. No ascites or free air. LYMPH NODES: No lymphadenopathy identified in the abdomen or pelvis. ABDOMINAL BONES AND SOFT TISSUES: No acute or suspicious osseous lesion identified in the abdomen or pelvis. IMPRESSION: 1. 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 Possible posterior left upper lobe lung primary (1.9 cm). 2. Negative for aortic dissection or aneurysm. Positive for aortic atherosclerosis. 3. No metastatic disease identified in the abdomen or pelvis Electronically signed by: Helayne Hurst MD 11/30/2023 11:45 AM EST RP Workstation: [id number]   CT Head Wo Contrast Result Date: 11/30/2023 EXAM: CT HEAD WITHOUT CONTRAST 11/30/2023 11:27:02 AM TECHNIQUE: CT of the head was performed without the administration of intravenous contrast. Automated exposure control, iterative reconstruction, and/or weight based adjustment of the mA/kV was utilized to reduce the radiation dose to as low as reasonably achievable. COMPARISON: Brain MRI 08/20/2021, head CT 09/25/2019. CLINICAL HISTORY: 53 year old female. Mental status change, unknown cause. FINDINGS: BRAIN AND VENTRICLES: No acute hemorrhage. Chronic lacunar infarct anterior left basal ganglia is stable from priors. Mild ex vacuo enlargement of the left frontal horn. Otherwise normal brain volume, ventricle size and configuration. Normal gray white differentiation elsewhere. No hydrocephalus. No extra-axial collection. No mass effect or midline shift. Calcified atherosclerosis at the skull base. No suspicious intracranial vascular hyperdensity. ORBITS: No acute abnormality. No gaze deviation. SINUSES: Paranasal sinuses, middle ears and mastoids remain well aerated. SOFT TISSUES AND SKULL: No acute soft tissue abnormality.  Hyperostosis of the calvarium, normal variant. No skull fracture. IMPRESSION: 1. No acute intracranial abnormality.  Chronic left basal ganglia infarct. Electronically signed by: Helayne Hurst MD 11/30/2023 11:32 AM EST RP Workstation: [id number]   DG Chest Portable 1 View Result Date: 11/30/2023 CLINICAL DATA:  cough EXAM: PORTABLE CHEST - 1 VIEW COMPARISON:  04/08/2023 FINDINGS: Vague patchy opacity in the right lung base. No pleural effusion or pneumothorax. No cardiomegaly. Aortic atherosclerosis. No acute fracture or destructive lesions. Multilevel thoracic osteophytosis. IMPRESSION: Vague, patchy airspace opacity in the right lung base, possibly representing atelectasis or a developing bronchopneumonia. Electronically Signed   By: Rogelia Myers M.D.   On: 11/30/2023 09:33    Microbiology: Results for orders placed or performed during the hospital encounter of 11/30/23  Resp panel by RT-PCR (RSV, Flu A&B, Covid) Anterior Nasal Swab     Status: None   Collection Time: 11/30/23  9:09 AM   Specimen: Anterior Nasal Swab  Result Value Ref Range Status   SARS Coronavirus 2 by RT PCR NEGATIVE NEGATIVE Final    Comment: (NOTE) SARS-CoV-2 target nucleic acids are NOT DETECTED.  The SARS-CoV-2 RNA is generally detectable in upper respiratory specimens during the acute phase of infection. The lowest concentration of SARS-CoV-2 viral copies this assay can detect is 138 copies/mL. A  negative result does not preclude SARS-Cov-2 infection and should not be used as the sole basis for treatment or other patient management decisions. A negative result may occur with  improper specimen collection/handling, submission of specimen other than nasopharyngeal swab, presence of viral mutation(s) within the areas targeted by this assay, and inadequate number of viral copies(<138 copies/mL). A negative result must be combined with clinical observations, patient history, and epidemiological information. The  expected result is Negative.  Fact Sheet for Patients:  bloggercourse.com  Fact Sheet for Healthcare Providers:  seriousbroker.it  This test is no t yet approved or cleared by the [place]  FDA and  has been authorized for detection and/or diagnosis of SARS-CoV-2 by FDA under an Emergency Use Authorization (EUA). This EUA will remain  in effect (meaning this test can be used) for the duration of the COVID-19 declaration under Section 564(b)(1) of the Act, 21 U.S.C.section 360bbb-3(b)(1), unless the authorization is terminated  or revoked sooner.       Influenza A by PCR NEGATIVE NEGATIVE Final   Influenza B by PCR NEGATIVE NEGATIVE Final    Comment: (NOTE) The Xpert Xpress SARS-CoV-2/FLU/RSV plus assay is intended as an aid in the diagnosis of influenza from Nasopharyngeal swab specimens and should not be used as a sole basis for treatment. Nasal washings and aspirates are unacceptable for Xpert Xpress SARS-CoV-2/FLU/RSV testing.  Fact Sheet for Patients: bloggercourse.com  Fact Sheet for Healthcare Providers: seriousbroker.it  This test is not yet approved or cleared by the [place]  FDA and has been authorized for detection and/or diagnosis of SARS-CoV-2 by FDA under an Emergency Use Authorization (EUA). This EUA will remain in effect (meaning this test can be used) for the duration of the COVID-19 declaration under Section 564(b)(1) of the Act, 21 U.S.C. section 360bbb-3(b)(1), unless the authorization is terminated or revoked.     Resp Syncytial Virus by PCR NEGATIVE NEGATIVE Final    Comment: (NOTE) Fact Sheet for Patients: bloggercourse.com  Fact Sheet for Healthcare Providers: seriousbroker.it  This test is not yet approved or cleared by the [place]  FDA and has been authorized for detection and/or  diagnosis of SARS-CoV-2 by FDA under an Emergency Use Authorization (EUA). This EUA will remain in effect (meaning this test can be used) for the duration of the COVID-19 declaration under Section 564(b)(1) of the Act, 21 U.S.C. section 360bbb-3(b)(1), unless the authorization is terminated or revoked.  Performed at [facility], [address], [place], [place] [address]     Labs: CBC: Recent Labs  Lab 11/30/23 0908 11/30/23 1011 12/01/23 0328  WBC 8.5  --  7.7  NEUTROABS 4.9  --   --   HGB 15.3* 16.3* 13.9  HCT 48.1* 48.0* 44.5  MCV 86.2  --  87.3  PLT 358  --  332   Basic Metabolic Panel: Recent Labs  Lab 11/30/23 0908 11/30/23 1011 12/01/23 0328  NA 138 136 138  K 3.8 5.9* 3.7  CL 96* 97* 99  CO2 28  --  27  GLUCOSE 125* 119* 116*  BUN 8 9 10   CREATININE 0.69 0.80 0.62  CALCIUM  9.8  --  9.6  MG 1.8  --   --    Liver Function Tests: Recent Labs  Lab 11/30/23 0908  AST 23  ALT 21  ALKPHOS 151*  BILITOT 0.4  PROT 7.8  ALBUMIN 4.5   CBG: Recent Labs  Lab 12/01/23 0724 12/01/23 1115 12/01/23 1603 12/01/23 2147 12/02/23 0747  GLUCAP 105*  152* 159* 133* 119*    Discharge time spent: greater than 30 minutes.  Signed: Owen DELENA Lore, MD [facility] 12/02/2023

## 2023-12-02 NOTE — Plan of Care (Signed)
  Problem: Health Behavior/Discharge Planning: Goal: Ability to manage health-related needs will improve Outcome: Progressing   Problem: Clinical Measurements: Goal: Will remain free from infection Outcome: Progressing Goal: Respiratory complications will improve Outcome: Progressing Goal: Cardiovascular complication will be avoided Outcome: Progressing   

## 2023-12-02 NOTE — Plan of Care (Signed)
  Problem: Education: Goal: Knowledge of General Education information will improve Description: Including pain rating scale, medication(s)/side effects and non-pharmacologic comfort measures Outcome: Adequate for Discharge   Problem: Health Behavior/Discharge Planning: Goal: Ability to manage health-related needs will improve Outcome: Adequate for Discharge   Problem: Clinical Measurements: Goal: Ability to maintain clinical measurements within normal limits will improve Outcome: Adequate for Discharge Goal: Will remain free from infection Outcome: Adequate for Discharge Goal: Diagnostic test results will improve Outcome: Adequate for Discharge Goal: Respiratory complications will improve Outcome: Adequate for Discharge Goal: Cardiovascular complication will be avoided Outcome: Adequate for Discharge   Problem: Activity: Goal: Risk for activity intolerance will decrease Outcome: Adequate for Discharge   Problem: Nutrition: Goal: Adequate nutrition will be maintained Outcome: Adequate for Discharge   Problem: Coping: Goal: Level of anxiety will decrease Outcome: Adequate for Discharge   Problem: Elimination: Goal: Will not experience complications related to bowel motility Outcome: Adequate for Discharge Goal: Will not experience complications related to urinary retention Outcome: Adequate for Discharge   Problem: Pain Managment: Goal: General experience of comfort will improve and/or be controlled Outcome: Adequate for Discharge   Problem: Safety: Goal: Ability to remain free from injury will improve Outcome: Adequate for Discharge   Problem: Skin Integrity: Goal: Risk for impaired skin integrity will decrease Outcome: Adequate for Discharge   Problem: Education: Goal: Knowledge of disease or condition will improve Outcome: Adequate for Discharge Goal: Knowledge of the prescribed therapeutic regimen will improve Outcome: Adequate for Discharge Goal:  Individualized Educational Video(s) Outcome: Adequate for Discharge   Problem: Activity: Goal: Ability to tolerate increased activity will improve Outcome: Adequate for Discharge Goal: Will verbalize the importance of balancing activity with adequate rest periods Outcome: Adequate for Discharge   Problem: Respiratory: Goal: Ability to maintain a clear airway will improve Outcome: Adequate for Discharge Goal: Levels of oxygenation will improve Outcome: Adequate for Discharge Goal: Ability to maintain adequate ventilation will improve Outcome: Adequate for Discharge   Problem: Education: Goal: Ability to describe self-care measures that may prevent or decrease complications (Diabetes Survival Skills Education) will improve Outcome: Adequate for Discharge Goal: Individualized Educational Video(s) Outcome: Adequate for Discharge   Problem: Coping: Goal: Ability to adjust to condition or change in health will improve Outcome: Adequate for Discharge   Problem: Fluid Volume: Goal: Ability to maintain a balanced intake and output will improve Outcome: Adequate for Discharge   Problem: Health Behavior/Discharge Planning: Goal: Ability to identify and utilize available resources and services will improve Outcome: Adequate for Discharge Goal: Ability to manage health-related needs will improve Outcome: Adequate for Discharge   Problem: Metabolic: Goal: Ability to maintain appropriate glucose levels will improve Outcome: Adequate for Discharge   Problem: Nutritional: Goal: Maintenance of adequate nutrition will improve Outcome: Adequate for Discharge Goal: Progress toward achieving an optimal weight will improve Outcome: Adequate for Discharge   Problem: Skin Integrity: Goal: Risk for impaired skin integrity will decrease Outcome: Adequate for Discharge   Problem: Tissue Perfusion: Goal: Adequacy of tissue perfusion will improve Outcome: Adequate for Discharge

## 2023-12-08 NOTE — Telephone Encounter (Signed)
 Because many providers are performing the procedure, Dr. Shelah had suggested that I just have it scheduled with whoever has openings  Whoever can accommodate the patient will be fine  And we just need to coordinate it with the patient regarding what timelines that may be able to make it for the procedure

## 2023-12-09 ENCOUNTER — Other Ambulatory Visit: Payer: Self-pay | Admitting: Pulmonary Disease

## 2023-12-09 ENCOUNTER — Encounter: Payer: Self-pay | Admitting: Pulmonary Disease

## 2023-12-09 DIAGNOSIS — R9389 Abnormal findings on diagnostic imaging of other specified body structures: Secondary | ICD-10-CM | POA: Insufficient documentation

## 2023-12-09 NOTE — Telephone Encounter (Signed)
 I did place the case request

## 2023-12-09 NOTE — Telephone Encounter (Signed)
 Patient has been scheduled for 12/26/23 . LVDM about Bronch letter with Follow up visit. Sending letter to patient now. Routing to [employer] for Authorization

## 2023-12-09 NOTE — Telephone Encounter (Signed)
 Hey Dr.Olalere, we can have the patient scheduled next available. We just need to the case request created before we can have it scheduled.

## 2023-12-14 NOTE — Progress Notes (Unsigned)
 NEUROLOGY FOLLOW UP OFFICE NOTE  Denise [name] [id number]  Assessment/Plan:   Headache Scalp neuralgia/left sided V1 trigeminal neuralgia, unchanged Possible myoclonus - most likely secondary to gabapentin . 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   Carbamazepine  to 400mg  twice daily for neuralgia and headache.  Check BMP in one month Will decrease gabapentin  to 600mg  three times daily for neuralgia and headache.  We can continue tapering down as needed/tolerated until myoclonus resolves. CPAP Follow up 7 months.   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   Trigeminal neuralgia improved.  Scalp neuralgia and headaches controlled.  She reports that sometimes her upper body will jerk, involving either head, neck or shoulders.  Unsure how long it has been occurring.   12/01/2023 LABS:  BMP with Na 138, K 3.7, Cl 99, CO2 27, glucose 116, BUN 10, Cr 0.62; CBC with WBC 7.7, HGB 13.9, HCT 44.5, PLT 332 11/30/2023 LABS: Hepatic panel with t bili 0.4, ALP 151, AST 13, ALT 21     Current NSAIDS:  none Current analgesics:  none Current anti-emetic:  Zofran  Current Antihypertensive medications:  Lisinopril , losartan , furosemide  Current Antidepressant/antipsychotic medications: Trintellix  Current Anticonvulsant medications:  Gabapentin  900mg  three times daily, carbamazepine  400mg  twice  daily  She is using the CPAP. Still smokes but using the patch.        HISTORY:  Scalp Neuralgia and Trigeminal Neuralgia: She developed headaches following hospitalization in late August 2021 for lithium  toxicity presenting with confusion and headaches.  CT of head personally reviewed showed partially empty sella and known old left basal ganglia infarct but no acute intracranial abnormality.  She reports that the headaches have not improved since treatment.  She gets the headaches daily, about 2 hours a day.  It feels like that her entire top of head (in cap distribution) is being stabbed with hot pokers.  She also endorses a hot pressure sticking her in her left eye.  Also a pressure on top of head.  Scalp pain in episodic, lasting 20 minutes 3 times a day.  However, she gets a paroxysmal shooting pain from the left eye up into center of head lasting 5 seconds.  She reports associated drooling off of the left side of her mouth.  Separate from the headaches, she reports that she has periodic blurred vision.  She also zones out from time to time.  She has increased difficulty speaking and slurs her speech.  She has trouble just functioning and performing physical tasks.  MRI of brain without contrast on 11/05/2019 showed nonspecific chronic scattered white matter changes with chronic left basal ganglia infarcts and remote pontine microhemorrhage   Cognitive complaints: Her husband is concerned about her memory.  She easily forgets conversations.  She has had memory deficits for some time. Sometimes has trouble getting words out.  No known family history of Alzheimer's.  Labs included negative HIV, negative RPR, and B12 245. Advised to start B12 1000mcg daily.  Repeat level was 512.  MRI of brain on 08/20/2021 personally reviewd revealed mild chronic small vessel  ischemic changes within the cerebral white matter and pons with chronic infarct within the posterior right lentiform nucleus and left corona  radiata and basal ganglia/internal capsule, stable compared to prior MRI from 08/05/2019.  Underwent neuropsychological evaluation on 11/10/2022 suggested that her cognitive dysfunction likely is due to her longstanding intellectual weaknesses exacerbated by cerebrovascular disease, frequent headaches, sleep dysfunction and psychiatric history, but not due to an underlying neurodegenerative disease.     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   Chronic diastolic CHF (congestive heart failure) 07/29/2021   Class 3 obesity with alveolar hypoventilation, serious comorbidity, and body mass index (BMI) of 50.0 to 59.9 in adult 06/12/2020   CVA (cerebrovascular accident) 2017   2017 MRI - chronic left lenticulostriate territory hemorrhage or hemorrhagic infarct, with hemosiderin, and encephalomalacia affecting both the caudate and lentiform nucleus     Decreased mobility 06/15/2021   DOE (dyspnea on exertion) 06/12/2020   Essential hypertension 12/18/2014   Fibroid uterus 12/18/2014   Heart murmur, systolic 06/29/2021   History of alcohol abuse 02/16/2017   History of cocaine use 02/16/2017   History of latent syphilis 02/16/2017   Hyperlipidemia associated with type 2 diabetes mellitus 11/07/2020   Incontinence of urine    Lacunar infarction    MRI - chronic lacunar infarct within the posterior right lentiform nucleus.     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   Low back pain  11/21/2017   Lumbar degenerative disc disease    Major depressive disorder 02/16/2017   Nuclear sclerosis of both eyes 10/20/2022   OSA on CPAP 06/12/2020   Overactive bladder    Pain due to onychomycosis of toenails of both feet 05/26/2020   Pelvic pain in female 12/18/2014   Psychogenic nonepileptic seizure 02/17/2017   PTSD (post-traumatic stress disorder)    Pulmonary emphysema 08/31/2021   QT prolongation 06/29/2021   Right sided weakness    Social anxiety disorder 11/03/2017   Syncope 02/07/2015   TIA (transient ischemic attack)    Tobacco use 04/14/2015   Type 2 diabetes mellitus 10/21/2017   Venous stasis dermatitis of both lower extremities 06/15/2021    MEDICATIONS: Current Outpatient Medications on File Prior to Visit  Medication Sig Dispense Refill   amLODipine  (NORVASC ) 10 MG tablet Take 10 mg by mouth daily.     atorvastatin  (LIPITOR) 20 MG tablet Take 1 tablet (20 mg total) by mouth daily. Pt needs to keep upcoming appt in July for further refills 30 tablet 0   budesonide -glycopyrrolate -formoterol  (BREZTRI  AEROSPHERE) 160-9-4.8 MCG/ACT AERO inhaler Inhale 2 puffs into the lungs in the morning and at bedtime. 10.7 g 10   buPROPion  (WELLBUTRIN  SR) 150 MG 12 hr tablet 150 mg p.o. daily for 7 days then 150 mg p.o. twice daily for 1 month 60 tablet 3   carbamazepine  (TEGRETOL ) 200 MG tablet TAKE 2 TABLETS BY MOUTH 2 TIMES DAILY 360 tablet 1   furosemide  (LASIX ) 80 MG tablet TAKE 1 TABLET BY MOUTH EVERY DAY 15 tablet 0   gabapentin  (NEURONTIN ) 300 MG capsule TAKE 3 CAPSULES BY MOUTH 3  TIMES DAILY 270 capsule 0   guaiFENesin  (MUCINEX ) 600 MG 12 hr tablet Take 2 tablets (1,200 mg total) by mouth 2 (two) times daily. 30 tablet 0   latanoprost (XALATAN) 0.005 % ophthalmic solution Apply 1 drop to eye.     lisinopril  (ZESTRIL ) 30 MG tablet Take 30 mg by mouth daily.     metFORMIN  (GLUCOPHAGE ) 500 MG tablet Take 500 mg by mouth 2 (two) times daily.  3   nicotine  (NICODERM CQ  -  DOSED IN MG/24 HOURS) 14 mg/24hr patch RX #2 Weeks 5-6: 14 mg x 1 patch daily. Wear for 24 hours. If you have sleep disturbances, remove at bedtime. 14 patch 0   Olopatadine HCl 0.2 % SOLN Apply 1 drop to eye.     pantoprazole  (PROTONIX ) 40 MG tablet Take 1 tablet (40 mg total) by mouth daily. 30 tablet 0   Polyethyl Glycol-Propyl Glycol (SYSTANE ULTRA) 0.4-0.3 % SOLN Apply 1 drop to eye.     potassium chloride  SA (KLOR-CON  M) 20 MEQ tablet Take 1 tablet (20 mEq total) by mouth daily. 30 tablet 1   senna-docusate (SENOKOT-S) 8.6-50 MG tablet Take 1 tablet by mouth at bedtime as needed for mild constipation.     VENTOLIN  HFA 108 (90 Base) MCG/ACT inhaler inhale 2 PUFFS into THE lungs EVERY 6 HOURS AS NEEDED FOR WHEEZING OR SHORTNESS OF BREATH (Patient taking differently: Inhale 2 puffs into the lungs in the morning and at bedtime.) 18 g 11   No current facility-administered medications on file prior to visit.    ALLERGIES: Allergies  Allergen Reactions   Gadolinium Derivatives Nausea And Vomiting and Other (See Comments)    Nausea and vomiting was followed by epileptic seizure episode that lasted approximately 5 minutes; pt was unable to verbally communicate during that time; she then came to and was able to speak; was evaluated by Rad and RN; kms    Grapefruit Flavor [Flavoring Agent (Non-Screening)] Other (See Comments)    Drug interaction    FAMILY HISTORY: Family History  Problem Relation Age of Onset   Stroke Mother    Anxiety disorder Mother    Drug abuse Mother    Alcohol abuse Mother    Depression Mother    Drug abuse Father    Anxiety disorder Father    Heart disease Maternal Grandmother    Colon cancer Neg Hx    Esophageal cancer Neg Hx    Pancreatic cancer Neg Hx    Stomach cancer Neg Hx    Liver disease Neg Hx    Breast cancer Neg Hx       Objective:  Blood pressure 126/74, pulse 83, height 5' 5 (1.651 m), weight 287 lb (130.2 kg), last menstrual period  05/29/2017, SpO2 95%. General: No acute distress.  Patient appears well-groomed.   Head:  Normocephalic/atraumatic Neck:  Supple.  No paraspinal tenderness.  Full range of motion. Heart:  Regular rate and rhythm. Neuro:  Alert and oriented.  Speech fluent and not dysarthric.  Language intact.  CN II-XII intact.  Bulk and tone normal.  Muscle strength 5/5 throughout.  Sensation to light touch intact.  Deep tendon reflexes 2+ throughout, toes downgoing.  Gait steady.    Juliene Dunnings, DO  CC:  Denise Rigg, FNP

## 2023-12-15 ENCOUNTER — Ambulatory Visit: Admitting: Neurology

## 2023-12-15 ENCOUNTER — Encounter: Payer: Self-pay | Admitting: Neurology

## 2023-12-15 VITALS — BP 126/74 | HR 83 | Ht 65.0 in | Wt 287.0 lb

## 2023-12-15 DIAGNOSIS — G500 Trigeminal neuralgia: Secondary | ICD-10-CM | POA: Diagnosis not present

## 2023-12-15 DIAGNOSIS — R519 Headache, unspecified: Secondary | ICD-10-CM

## 2023-12-15 DIAGNOSIS — G253 Myoclonus: Secondary | ICD-10-CM

## 2023-12-15 DIAGNOSIS — M792 Neuralgia and neuritis, unspecified: Secondary | ICD-10-CM

## 2023-12-15 MED ORDER — CARBAMAZEPINE 200 MG PO TABS: 400.0000 mg | ORAL_TABLET | Freq: Two times a day (BID) | ORAL | 1 refills | Status: AC

## 2023-12-15 MED ORDER — GABAPENTIN 300 MG PO CAPS: 600.0000 mg | ORAL_CAPSULE | Freq: Three times a day (TID) | ORAL | 6 refills | Status: DC

## 2023-12-15 NOTE — Patient Instructions (Signed)
 The head jerking may be gabapentin .  Decrease to 2 pills three times daily.  If headaches still okay but the head jerking not improvement in 6 weeks, contact me and we can decrease dose further Continue carbamazepine  2 pills twice daily Follow up 7 months.

## 2023-12-16 ENCOUNTER — Ambulatory Visit: Admitting: Podiatry

## 2023-12-16 ENCOUNTER — Encounter: Payer: Self-pay | Admitting: Podiatry

## 2023-12-16 DIAGNOSIS — E1169 Type 2 diabetes mellitus with other specified complication: Secondary | ICD-10-CM | POA: Diagnosis not present

## 2023-12-16 DIAGNOSIS — E785 Hyperlipidemia, unspecified: Secondary | ICD-10-CM

## 2023-12-16 DIAGNOSIS — B351 Tinea unguium: Secondary | ICD-10-CM

## 2023-12-16 DIAGNOSIS — M79675 Pain in left toe(s): Secondary | ICD-10-CM

## 2023-12-16 DIAGNOSIS — M79674 Pain in right toe(s): Secondary | ICD-10-CM

## 2023-12-16 NOTE — Progress Notes (Signed)
 This patient presents to the office with chief complaint of long thick nails     This patient says there are long thick painful nails.  These nails are painful walking and wearing shoes.   Patient is unable to  self treat her own nails . Patient has been diagnosed with diabetes and claudication. This patient presents  to the office today for treatment of the  long nails.  General Appearance  Alert, conversant and in no acute stress.  Vascular  Dorsalis pedis and posterior tibial  pulses are weakly  palpable  bilaterally.  Capillary return is within normal limits  bilaterally. Temperature is within normal limits  bilaterally.  Neurologic  Senn-Weinstein monofilament wire test within normal limits/diminished  B/L.  Muscle power within normal limits bilaterally.  Nails Thick disfigured discolored nails with subungual debris  from hallux to fifth toes bilaterally. No evidence of bacterial infection or drainage bilaterally.  Orthopedic  No limitations of motion of motion feet .  No crepitus or effusions noted.  No bony pathology or digital deformities noted.  Skin  Dry skin with no porokeratosis noted bilaterally.  No signs of infections or ulcers noted.     Onychomycosis    Debride nails  with nail nipper  10.    RTC 4   months.     Helane Gunther DPM

## 2023-12-19 ENCOUNTER — Other Ambulatory Visit: Payer: Self-pay | Admitting: Pulmonary Disease

## 2023-12-19 NOTE — Telephone Encounter (Signed)
 Pt is requesting refill of Wellbutrin , Rx states: TAKE 1 TABLET BY MOUTH ONCE DAILY FOR 7 DAYS, THEN INCREASE TO 1 tablet 2 TIMES DAILY FOR 1 MONTH   Please advise if she should continue taking this medication, as it has been over one month since originally prescribed. Thank you!

## 2023-12-20 NOTE — Progress Notes (Incomplete)
 SDW CALL  Patient was given pre-op instructions over the phone. The opportunity was given for the patient to ask questions. No further questions asked. Patient verbalized understanding of instructions given.   PCP - Duwaine Annabella SAILOR, FNP Cardiologist - Santo Stanly LABOR, MD   PPM/ICD -  Device Orders -  Rep Notified -   Chest x-ray -  EKG - 1view 11/30/23 Stress Test - Myoview  Perfusion 07/22/20 ECHO - 01/18/23 Cardiac Cath -   Sleep Study - 08/23/19 CPAP - yes  Fasting Blood Sugar -  Checks Blood Sugar _____ times a day  Blood Thinner Instructions: Aspirin  Instructions:  ERAS Protcol -NPO PRE-SURGERY Ensure or G2-   COVID TEST- NA   Anesthesia review: yes-Hx DM2,COPD,CHF,Home O2 2L,OSA on CPAP,HTN.  Patient denies shortness of breath, fever, cough and chest pain over the phone call  Oral Hygiene is also important to reduce your risk of infection.  Remember - BRUSH YOUR TEETH THE MORNING OF SURGERY WITH YOUR REGULAR TOOTHPASTE

## 2023-12-21 NOTE — Progress Notes (Signed)
 Anesthesia Chart Review: SAME DAY WORK-UP  Case: [id number] Date/Time: 12/26/23 1315   Procedures:      VIDEO BRONCHOSCOPY WITH ENDOBRONCHIAL NAVIGATION (Left) - Left upper lobe mass     BRONCHOSCOPY, WITH EBUS - Mediastinal adenopathy   Anesthesia type: General   Diagnosis: Abnormal CT of the chest [R93.89]   Pre-op diagnosis: Lung mass   Location: [facility] / [facility]   Surgeons: Shelah Lamar RAMAN, MD       DISCUSSION: Patient is a 53 year old female scheduled for the above procedure.  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 No current cocaine use is documented  She had [place] admission 11/30/2023 - 12/02/2023 for COPD exacerbation with episode of AMS/intermittent jaw movement in triage. Vague opacity in right lung base on CXR. Head CT wit old left infarct but no acute findings. EEG was normal. CTA of the chest/abd/pelvis showed a spiculated 1.9 LUL lung lesion with innumerable lung nodules suggestive of metastatic disease. COPD treated with doxycycline , Duonebs, Breztri , guaifenesin , steroids. Weaned done to her baseline of 2L O2 by discharge. Pulmonology planned to arrange out-patient bronchoscopy and biopsies.  She had neurology follow-up with Dr. Skeet on 11/20/22025 for headaches, bigeminal and scalp neuralgia, right sided weakness secondary to hemorrhagic stroke, psychogenic non-epileptic seizures, OSA (using CPAP). She also had a neuropsychological evaluation on 11/10/2022 that suggested, cognitive dysfunction likely is due to her longstanding  intellectual weaknesses exacerbated by cerebrovascular disease, frequent headaches, sleep dysfunction and psychiatric history, but not due to an underlying neurodegenerative disease. 12/15/2023 note reviewed. She was on carbamazepine  and gabapentin  for neuralgia and headaches. Gabapentin  dose decreased due to possible myoclonus and can continue to taper as needed until myoclonus resolved. Follow-up in 7 months planned.  Nonischemic stress test in June 2022.  TTE in December 2024 showed normal LVEF, no regional wall motion abnormalities, no significant valvular disease.  A1c 6.2% in May. She is on metformin .  She is a same-day workup, so anesthesia team to evaluate on the day of surgery. Last labs and EKG are from her recent admission.   VS:  Wt Readings from Last 3 Encounters:  12/15/23 130.2 kg  11/30/23 (!) 269.8 kg  09/21/23 133.8 kg   BP Readings from Last 3 Encounters:  12/15/23 126/74  12/02/23 132/82  09/21/23 127/76   Pulse Readings from Last 3 Encounters:  12/15/23 83  12/02/23 70  09/21/23 87     PROVIDERS: Duwaine Annabella SAILOR, FNP is PCP  Skeet Cornet, DO is neurologist Neda Hammond, MD is pulmonologist - Cardiology evaluation by Santo Kelly, MD in [phone] DOE, atypical chest pain. Prolonged QT 03/12/2021. Testing outlined below in CV.    LABS: For day of surgery as indicated. Most recent results in [facility] include: Lab Results  Component Value Date   WBC 7.7 12/01/2023   HGB 13.9 12/01/2023   HCT 44.5 12/01/2023   PLT 332 12/01/2023   GLUCOSE 116 (H) 12/01/2023   ALT 21 11/30/2023   AST 23 11/30/2023   NA 138 12/01/2023   K 3.7 12/01/2023   CL 99 12/01/2023   CREATININE 0.62 12/01/2023   BUN 10 12/01/2023   CO2 27 12/01/2023  HGBA1C 6.2 (H) 06/25/2023     OTHER: EEG 11/30/2023: IMPRESSION: - This study is within normal limits. No seizures or epileptiform discharges were seen throughout the recording. - A normal interictal EEG does not  exclude the diagnosis of epilepsy.  PFTS 02/28/2023: FVC 1.85 (51%), post no change. FEV1 1.56 (55%), post 1.62 (57%). DLCO unc 12.41 (57%), cor 12.45 (58%).    IMAGES: CTA Chest/abd/pelvis 11/30/2023: - 1.9 cm spiculated posterior left upper lobe lung mass adjacent to the major fissure on series 6 image 40, possibly representing a primary lung malignancy. Superimposed diffuse pulmonary nodularity in all lobes, innumerable lung nodules most compatible with metastatic disease, many between 5 and 10 mm in size. No evidence of pleural effusion or pneumothorax. IMPRESSION: 1. 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 Possible posterior left upper lobe lung primary (1.9 cm). 2. Negative for aortic dissection or aneurysm. Positive for aortic atherosclerosis. 3. No metastatic disease identified in the abdomen or pelvis  CT Head 11/30/2023: IMPRESSION: 1. No acute intracranial abnormality.  Chronic left basal ganglia infarct.    EKG: EKG 11/30/2023: Normal sinus rhythm.  Low voltage, precordial leads.  Nonspecific T waves similar to May 2025.   CV: Echo 01/18/2023: IMPRESSIONS   1. Left ventricular ejection fraction, by estimation, is 60 to 65%. The  left ventricle has normal function. The left ventricle has no regional  wall motion abnormalities. Left ventricular diastolic parameters were  normal. The average left ventricular  global longitudinal strain is -22.2 %. The global longitudinal strain is  normal.   2. Right ventricular systolic function is normal. The right ventricular  size is normal. Tricuspid regurgitation signal is inadequate for assessing  PA pressure.   3. The mitral valve is normal in structure. No evidence of mitral valve  regurgitation. No evidence of mitral stenosis.   4. The aortic valve is tricuspid. Aortic valve regurgitation is trivial.  Aortic valve sclerosis is present, with no evidence of  aortic valve  stenosis.   5. The inferior vena cava is normal in size with greater than 50%  respiratory variability, suggesting right atrial pressure of 3 mmHg.  - Comparison(s): No significant change from prior study.    Nuclear stress test 07/22/2020: Nuclear stress EF: 58%. The left ventricular ejection fraction is normal (55-65%). There was no ST segment deviation noted during stress. This is a low risk study. There is no evidence of ischemia and no evidence of previous infarction. The study is normal.   US  Carotid 02/08/2015: Summary:  Bilateral: intimal wall thickening CCA. Mild soft plaque origin  ICA. 1-39% ICA plaquing. Vertebral artery flow is antegrade.    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   Chronic diastolic CHF (congestive heart failure) 07/29/2021   Class 3 obesity with alveolar hypoventilation, serious comorbidity, and body mass index (BMI) of 50.0 to 59.9 in adult 06/12/2020   CVA (cerebrovascular accident) 2017   2017 MRI - chronic left lenticulostriate territory hemorrhage or hemorrhagic infarct, with hemosiderin, and encephalomalacia affecting both the caudate and lentiform nucleus     Decreased mobility 06/15/2021   DOE (dyspnea on exertion) 06/12/2020   Essential hypertension 12/18/2014   Fibroid uterus 12/18/2014   Heart murmur, systolic 06/29/2021   History of alcohol abuse  02/16/2017   History of cocaine use 02/16/2017   History of latent syphilis 02/16/2017   Hyperlipidemia associated with type 2 diabetes mellitus 11/07/2020   Incontinence of urine    Lacunar infarction    MRI - chronic lacunar infarct within the posterior right lentiform nucleus.     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   Low back pain 11/21/2017   Lumbar degenerative disc disease     Major depressive disorder 02/16/2017   Nuclear sclerosis of both eyes 10/20/2022   OSA on CPAP 06/12/2020   Overactive bladder    Pain due to onychomycosis of toenails of both feet 05/26/2020   Pelvic pain in female 12/18/2014   Psychogenic nonepileptic seizure 02/17/2017   PTSD (post-traumatic stress disorder)    Pulmonary emphysema 08/31/2021   QT prolongation 06/29/2021   Right sided weakness    Social anxiety disorder 11/03/2017   Syncope 02/07/2015   TIA (transient ischemic attack)    Tobacco use 04/14/2015   Type 2 diabetes mellitus 10/21/2017   Venous stasis dermatitis of both lower extremities 06/15/2021    Past Surgical History:  Procedure Laterality Date   CESAREAN SECTION     x 2    MEDICATIONS: No current facility-administered medications for this encounter.    amLODipine  (NORVASC ) 10 MG tablet   atorvastatin  (LIPITOR) 20 MG tablet   budesonide -glycopyrrolate -formoterol  (BREZTRI  AEROSPHERE) 160-9-4.8 MCG/ACT AERO inhaler   buPROPion  (WELLBUTRIN  SR) 150 MG 12 hr tablet   carbamazepine  (TEGRETOL ) 200 MG tablet   furosemide  (LASIX ) 80 MG tablet   gabapentin  (NEURONTIN ) 300 MG capsule   guaiFENesin  (MUCINEX ) 600 MG 12 hr tablet   latanoprost (XALATAN) 0.005 % ophthalmic solution   lisinopril  (ZESTRIL ) 30 MG tablet   metFORMIN  (GLUCOPHAGE ) 500 MG tablet   nicotine  (NICODERM CQ  - DOSED IN MG/24 HOURS) 14 mg/24hr patch   Olopatadine HCl 0.2 % SOLN   pantoprazole  (PROTONIX ) 40 MG tablet   Polyethyl Glycol-Propyl Glycol (SYSTANE ULTRA) 0.4-0.3 % SOLN   potassium chloride  SA (KLOR-CON  M) 20 MEQ tablet   senna-docusate (SENOKOT-S) 8.6-50 MG tablet   VENTOLIN  HFA 108 (90 Base) MCG/ACT inhaler    Isaiah Ruder, PA-C Surgical Short Stay/Anesthesiology [facility] Phone [phone] [facility] Phone [phone] 12/21/2023 11:56 AM

## 2023-12-21 NOTE — Anesthesia Preprocedure Evaluation (Signed)
 Anesthesia Evaluation    Airway        Dental   Pulmonary Current Smoker          Cardiovascular hypertension,      Neuro/Psych    GI/Hepatic   Endo/Other  diabetes    Renal/GU      Musculoskeletal   Abdominal   Peds  Hematology   Anesthesia Other Findings   Reproductive/Obstetrics                              Anesthesia Physical Anesthesia Plan  ASA:   Anesthesia Plan:    Post-op Pain Management:    Induction:   PONV Risk Score and Plan:   Airway Management Planned:   Additional Equipment:   Intra-op Plan:   Post-operative Plan:   Informed Consent:   Plan Discussed with:   Anesthesia Plan Comments: (PAT note written 12/21/2023 by Jalil Lorusso, PA-C.  )        Anesthesia Quick Evaluation

## 2023-12-23 ENCOUNTER — Encounter (HOSPITAL_COMMUNITY): Payer: Self-pay | Admitting: Emergency Medicine

## 2023-12-23 ENCOUNTER — Other Ambulatory Visit: Payer: Self-pay

## 2023-12-23 NOTE — Progress Notes (Signed)
 SDW call  Patient was given pre-op instructions over the phone. Patient verbalized understanding of instructions provided.     PCP - Annabella Rigg, FNP Cardiologist - Dr.  Stanly Leavens Neurology: Dr. Juliene Amble Pulmonary:    PPM/ICD - denies Device Orders - na Rep Notified - na   Chest x-ray - 11/30/2023 EKG -  11/30/2023 Stress Test -07/22/2020 ECHO - 01/18/2023 Cardiac Cath -   Sleep Study/sleep apnea/CPAP: OSA with nightly CPAP.  Also uses home oxygen 2L/  Type II diabetic.  Does not check her blood sugars, states she does not have a monitor. A1C 6.2 on 06/25/2023 Fasting Blood sugar range:na How often check sugars:na Metformin , hold DOS   Blood Thinner Instructions: denies Aspirin  Instructions:denies   ERAS Protcol - NPO  Anesthesia review: Yes. OSA with CPAP, COPD, DM, CVA, MI, HTN,    Patient denies shortness of breath, fever, cough and chest pain over the phone call  Your procedure is scheduled on Monday December 26, 2023  Report to [facility] Main Entrance A at  1045  A.M., then check in with the Admitting office.  Call this number if you have problems the morning of surgery:  [phone]   If you have any questions prior to your surgery date call [phone]: Open Monday-Friday 8am-4pm If you experience any cold or flu symptoms such as cough, fever, chills, shortness of breath, etc. between now and your scheduled surgery, please notify us  at the above number    Remember:  Do not eat or drink after midnight the night before your surgery  Take these medicines the morning of surgery with A SIP OF WATER:  Norvasc , lipitor, brextri, wellbutrin , tegretol , gabapentin , mucinex , olopatadine, protonix , systane,   As needed: Ventolin   As of today, STOP taking any Aspirin  (unless otherwise instructed by your surgeon) Aleve, Naproxen, Ibuprofen , Motrin , Advil , Goody's, BC's, all herbal medications, fish oil, and all vitamins.

## 2023-12-26 ENCOUNTER — Encounter (HOSPITAL_COMMUNITY): Payer: Self-pay | Admitting: Vascular Surgery

## 2023-12-26 ENCOUNTER — Ambulatory Visit (HOSPITAL_COMMUNITY): Admission: RE | Admit: 2023-12-26 | Source: Home / Self Care | Admitting: Emergency Medicine

## 2023-12-26 DIAGNOSIS — R9389 Abnormal findings on diagnostic imaging of other specified body structures: Secondary | ICD-10-CM | POA: Insufficient documentation

## 2023-12-26 HISTORY — DX: Acute myocardial infarction, unspecified: I21.9

## 2023-12-26 SURGERY — VIDEO BRONCHOSCOPY WITH ENDOBRONCHIAL NAVIGATION
Anesthesia: General

## 2023-12-27 NOTE — Telephone Encounter (Signed)
 Thank you for the update. I called the patient three times and the husband is unreachable. I left a voicemail on the patient's phone requesting that they call back to reschedule the bronchoscopy and to confirm the date they will be able to arrive. Once I receive their call, I will proceed with rescheduling ===View-only below this line=== ----- Message ----- From: Maurice Clotilda ORN, RN Sent: 12/26/2023   9:45 AM EST To: Lamar GORMAN Chris, MD; Jennet DELENA Epley, MD; C* Subject: pt canceled today                              Hey, Just wanted to let everyone know that she canceled her nav bronch for today due to issues with her husband. I told her I would let the office know she needed to get rescheduled. Thanks!  Clotilda

## 2023-12-28 ENCOUNTER — Other Ambulatory Visit: Payer: Self-pay

## 2023-12-28 ENCOUNTER — Encounter (HOSPITAL_COMMUNITY): Payer: Self-pay | Admitting: Emergency Medicine

## 2023-12-28 NOTE — Progress Notes (Signed)
 SDW CALL  Patient was given pre-op instructions over the phone. The opportunity was given for the patient to ask questions. No further questions asked. Patient verbalized understanding of instructions given.  PCP - Annabella Rigg, FNP (LOV per patient has appointment 12-28-23) Cardiologist - Dr.  Stanly Leavens (per patient has a appointment 01-03-24) Neurology: Dr. Juliene Amble  PPM/ICD - denies Device Orders - n/a Rep Notified - n/a  Chest x-ray - 11-30-23 EKG - 11-30-23 Stress Test - 07-22-20 ECHO - 01-18-23 Cardiac Cath -   Sleep Study -  CPAP - uses nightly and wear 02 2l at all time  Fasting Blood Sugar - per patient she does not check her blood sugar. She reports she has no machine  Blood Thinner Instructions:denies Aspirin  Instructions:denies  ERAS Protcol - NPO  COVID TEST-   Anesthesia review: yes Hx of OSA, COPD, MI, DM, HTN    -------------  SDW INSTRUCTIONS given:  Your procedure is scheduled on January 02, 2024.  Report to [facility] Main Entrance A at 12:30 P.M., and check in at the Admitting office.  Call this number if you have problems the morning of surgery:  [phone]   Remember:  Do not eat or drink after midnight the night before your surgery    Take these medicines the morning of surgery with A SIP OF WATER  amLODipine  (NORVASC )  atorvastatin  (LIPITOR)  BREZTRI  AEROSPHERE  buPROPion  (WELLBUTRIN  SR)  carbamazepine  (TEGRETOL )  gabapentin  (NEURONTIN )  guaiFENesin  (MUCINEX  ) pantoprazole  (PROTONIX )  VENTOLIN  inhaler    As of today, STOP taking any Aspirin  (unless otherwise instructed by your surgeon) Aleve, Naproxen, Ibuprofen , Motrin , Advil , Goody's, BC's, all herbal medications, fish oil, and all vitamins.      Questions were answered. Patient verbalized understanding of instructions.   Patient verbally denies any shortness of breath, fever, cough and chest pain during phone call

## 2023-12-29 NOTE — Anesthesia Preprocedure Evaluation (Signed)
 Anesthesia Evaluation    Airway        Dental   Pulmonary Current Smoker          Cardiovascular hypertension,      Neuro/Psych    GI/Hepatic   Endo/Other  diabetes    Renal/GU      Musculoskeletal   Abdominal   Peds  Hematology   Anesthesia Other Findings   Reproductive/Obstetrics                              Anesthesia Physical Anesthesia Plan  ASA:   Anesthesia Plan:    Post-op Pain Management:    Induction:   PONV Risk Score and Plan:   Airway Management Planned:   Additional Equipment:   Intra-op Plan:   Post-operative Plan:   Informed Consent:   Plan Discussed with:   Anesthesia Plan Comments: (PAT note written 12/21/2023 by Shenia Alan, PA-C. Case rescheduled from 12/26/2023 to 01/02/2024.   )        Anesthesia Quick Evaluation

## 2024-01-02 ENCOUNTER — Encounter (HOSPITAL_COMMUNITY): Payer: Self-pay | Admitting: Vascular Surgery

## 2024-01-02 ENCOUNTER — Ambulatory Visit: Admitting: Acute Care

## 2024-01-02 ENCOUNTER — Ambulatory Visit (HOSPITAL_COMMUNITY): Admission: RE | Admit: 2024-01-02 | Source: Home / Self Care | Admitting: Emergency Medicine

## 2024-01-02 SURGERY — VIDEO BRONCHOSCOPY WITH ENDOBRONCHIAL NAVIGATION
Anesthesia: General

## 2024-01-06 ENCOUNTER — Ambulatory Visit: Admitting: Pulmonary Disease

## 2024-01-08 ENCOUNTER — Telehealth: Payer: Self-pay

## 2024-01-08 NOTE — Telephone Encounter (Signed)
 Pt scheduled for bronch f/u 12/15. I contacted pt to r/s due to office being closed.  She stated that she has not been scheduled for bronch yet. She is requesting a call back.  PCC's, please advise. Thanks

## 2024-01-09 ENCOUNTER — Ambulatory Visit: Admitting: Acute Care

## 2024-01-09 NOTE — Telephone Encounter (Signed)
completedd

## 2024-01-09 NOTE — Telephone Encounter (Signed)
 Please see the refill request form scanned into patient's chart. Thank you!

## 2024-01-10 ENCOUNTER — Other Ambulatory Visit (HOSPITAL_COMMUNITY): Payer: Self-pay

## 2024-01-10 ENCOUNTER — Other Ambulatory Visit: Payer: Self-pay

## 2024-01-10 ENCOUNTER — Emergency Department (HOSPITAL_COMMUNITY)
Admission: EM | Admit: 2024-01-10 | Discharge: 2024-01-11 | Disposition: A | Attending: Emergency Medicine | Admitting: Emergency Medicine

## 2024-01-10 ENCOUNTER — Telehealth: Payer: Self-pay

## 2024-01-10 ENCOUNTER — Encounter: Payer: Self-pay | Admitting: Emergency Medicine

## 2024-01-10 ENCOUNTER — Emergency Department (HOSPITAL_COMMUNITY)

## 2024-01-10 DIAGNOSIS — F329 Major depressive disorder, single episode, unspecified: Secondary | ICD-10-CM

## 2024-01-10 DIAGNOSIS — Z79899 Other long term (current) drug therapy: Secondary | ICD-10-CM | POA: Diagnosis not present

## 2024-01-10 DIAGNOSIS — R071 Chest pain on breathing: Secondary | ICD-10-CM | POA: Insufficient documentation

## 2024-01-10 DIAGNOSIS — R911 Solitary pulmonary nodule: Secondary | ICD-10-CM | POA: Diagnosis not present

## 2024-01-10 DIAGNOSIS — F411 Generalized anxiety disorder: Secondary | ICD-10-CM | POA: Insufficient documentation

## 2024-01-10 DIAGNOSIS — F419 Anxiety disorder, unspecified: Secondary | ICD-10-CM

## 2024-01-10 DIAGNOSIS — J449 Chronic obstructive pulmonary disease, unspecified: Secondary | ICD-10-CM | POA: Insufficient documentation

## 2024-01-10 DIAGNOSIS — R45851 Suicidal ideations: Secondary | ICD-10-CM

## 2024-01-10 DIAGNOSIS — Z7984 Long term (current) use of oral hypoglycemic drugs: Secondary | ICD-10-CM | POA: Diagnosis not present

## 2024-01-10 DIAGNOSIS — E119 Type 2 diabetes mellitus without complications: Secondary | ICD-10-CM | POA: Insufficient documentation

## 2024-01-10 DIAGNOSIS — I10 Essential (primary) hypertension: Secondary | ICD-10-CM | POA: Insufficient documentation

## 2024-01-10 LAB — BLOOD GAS, VENOUS
Acid-Base Excess: 1.5 mmol/L (ref 0.0–2.0)
Bicarbonate: 27.2 mmol/L (ref 20.0–28.0)
O2 Saturation: 97.2 %
Patient temperature: 37
pCO2, Ven: 46 mmHg (ref 44–60)
pH, Ven: 7.38 (ref 7.25–7.43)
pO2, Ven: 69 mmHg — ABNORMAL HIGH (ref 32–45)

## 2024-01-10 LAB — CBC WITH DIFFERENTIAL/PLATELET
Abs Immature Granulocytes: 0.03 K/uL (ref 0.00–0.07)
Basophils Absolute: 0 K/uL (ref 0.0–0.1)
Basophils Relative: 0 %
Eosinophils Absolute: 0.2 K/uL (ref 0.0–0.5)
Eosinophils Relative: 2 %
HCT: 41.3 % (ref 36.0–46.0)
Hemoglobin: 13.9 g/dL (ref 12.0–15.0)
Immature Granulocytes: 0 %
Lymphocytes Relative: 26 %
Lymphs Abs: 2.5 K/uL (ref 0.7–4.0)
MCH: 30 pg (ref 26.0–34.0)
MCHC: 33.7 g/dL (ref 30.0–36.0)
MCV: 89 fL (ref 80.0–100.0)
Monocytes Absolute: 0.7 K/uL (ref 0.1–1.0)
Monocytes Relative: 7 %
Neutro Abs: 6.3 K/uL (ref 1.7–7.7)
Neutrophils Relative %: 65 %
Platelets: 374 K/uL (ref 150–400)
RBC: 4.64 MIL/uL (ref 3.87–5.11)
RDW: 14.7 % (ref 11.5–15.5)
WBC: 9.7 K/uL (ref 4.0–10.5)
nRBC: 0 % (ref 0.0–0.2)

## 2024-01-10 LAB — URINE DRUG SCREEN
Amphetamines: NEGATIVE
Barbiturates: NEGATIVE
Benzodiazepines: NEGATIVE
Cocaine: POSITIVE — AB
Fentanyl: NEGATIVE
Methadone Scn, Ur: NEGATIVE
Opiates: POSITIVE — AB
Tetrahydrocannabinol: NEGATIVE

## 2024-01-10 LAB — RESP PANEL BY RT-PCR (RSV, FLU A&B, COVID)  RVPGX2
Influenza A by PCR: NEGATIVE
Influenza B by PCR: NEGATIVE
Resp Syncytial Virus by PCR: NEGATIVE
SARS Coronavirus 2 by RT PCR: NEGATIVE

## 2024-01-10 LAB — COMPREHENSIVE METABOLIC PANEL WITH GFR
ALT: 14 U/L (ref 0–44)
AST: 19 U/L (ref 15–41)
Albumin: 4.5 g/dL (ref 3.5–5.0)
Alkaline Phosphatase: 160 U/L — ABNORMAL HIGH (ref 38–126)
Anion gap: 14 (ref 5–15)
BUN: 7 mg/dL (ref 6–20)
CO2: 26 mmol/L (ref 22–32)
Calcium: 9.7 mg/dL (ref 8.9–10.3)
Chloride: 98 mmol/L (ref 98–111)
Creatinine, Ser: 0.59 mg/dL (ref 0.44–1.00)
GFR, Estimated: >60 mL/min (ref >60)
Glucose, Bld: 103 mg/dL — ABNORMAL HIGH (ref 70–99)
Potassium: 3.9 mmol/L (ref 3.5–5.1)
Sodium: 139 mmol/L (ref 135–145)
Total Bilirubin: 0.3 mg/dL (ref 0.0–1.2)
Total Protein: 7.3 g/dL (ref 6.5–8.1)

## 2024-01-10 LAB — TROPONIN T, HIGH SENSITIVITY
Troponin T High Sensitivity: <15 ng/L (ref 0–19)
Troponin T High Sensitivity: <15 ng/L (ref 0–19)

## 2024-01-10 LAB — ETHANOL: Alcohol, Ethyl (B): <15 mg/dL (ref <15)

## 2024-01-10 MED ORDER — CARBAMAZEPINE 200 MG PO TABS
400.0000 mg | ORAL_TABLET | Freq: Two times a day (BID) | ORAL | Status: DC
  Administered 2024-01-10 – 2024-01-11 (×2): 400 mg via ORAL
  Filled 2024-01-10 (×2): qty 2

## 2024-01-10 MED ORDER — HYDROXYZINE HCL 25 MG PO TABS: 25.0000 mg | ORAL_TABLET | Freq: Three times a day (TID) | ORAL | Status: DC | PRN

## 2024-01-10 MED ORDER — LISINOPRIL 20 MG PO TABS
30.0000 mg | ORAL_TABLET | Freq: Every day | ORAL | Status: DC
  Administered 2024-01-10 – 2024-01-11 (×2): 30 mg via ORAL
  Filled 2024-01-10: qty 1
  Filled 2024-01-10: qty 3

## 2024-01-10 MED ORDER — PANTOPRAZOLE SODIUM 40 MG PO TBEC
40.0000 mg | DELAYED_RELEASE_TABLET | Freq: Every day | ORAL | Status: DC
  Administered 2024-01-10 – 2024-01-11 (×2): 40 mg via ORAL
  Filled 2024-01-10 (×2): qty 1

## 2024-01-10 MED ORDER — BUDESON-GLYCOPYRROL-FORMOTEROL 160-9-4.8 MCG/ACT IN AERO
2.0000 | INHALATION_SPRAY | Freq: Two times a day (BID) | RESPIRATORY_TRACT | Status: DC
  Administered 2024-01-11: 10:00:00 2 via RESPIRATORY_TRACT
  Filled 2024-01-10: qty 5.9

## 2024-01-10 MED ORDER — ATORVASTATIN CALCIUM 10 MG PO TABS
20.0000 mg | ORAL_TABLET | Freq: Every day | ORAL | Status: DC
  Administered 2024-01-10 – 2024-01-11 (×2): 20 mg via ORAL
  Filled 2024-01-10 (×2): qty 2

## 2024-01-10 MED ORDER — METFORMIN HCL 500 MG PO TABS
500.0000 mg | ORAL_TABLET | Freq: Two times a day (BID) | ORAL | Status: DC
  Administered 2024-01-10 (×2): 500 mg via ORAL
  Filled 2024-01-10 (×3): qty 1

## 2024-01-10 MED ORDER — ALBUTEROL SULFATE HFA 108 (90 BASE) MCG/ACT IN AERS: 2.0000 | INHALATION_SPRAY | Freq: Four times a day (QID) | RESPIRATORY_TRACT | Status: DC | PRN

## 2024-01-10 MED ORDER — GUAIFENESIN ER 600 MG PO TB12
1200.0000 mg | ORAL_TABLET | Freq: Two times a day (BID) | ORAL | Status: DC
  Administered 2024-01-10 – 2024-01-11 (×3): 1200 mg via ORAL
  Filled 2024-01-10 (×4): qty 2

## 2024-01-10 MED ORDER — BUPROPION HCL ER (SR) 150 MG PO TB12
150.0000 mg | ORAL_TABLET | Freq: Two times a day (BID) | ORAL | Status: DC
  Administered 2024-01-10 – 2024-01-11 (×3): 150 mg via ORAL
  Filled 2024-01-10 (×3): qty 1

## 2024-01-10 MED ORDER — OLOPATADINE HCL 0.1 % OP SOLN
1.0000 [drp] | Freq: Two times a day (BID) | OPHTHALMIC | Status: DC
  Administered 2024-01-10: 22:00:00 1 [drp] via OPHTHALMIC
  Filled 2024-01-10: qty 5

## 2024-01-10 MED ORDER — OXYCODONE-ACETAMINOPHEN 5-325 MG PO TABS
1.0000 | ORAL_TABLET | Freq: Three times a day (TID) | ORAL | Status: DC | PRN
  Administered 2024-01-10: 13:00:00 1 via ORAL
  Filled 2024-01-10: qty 1

## 2024-01-10 MED ORDER — AMLODIPINE BESYLATE 5 MG PO TABS
10.0000 mg | ORAL_TABLET | Freq: Every day | ORAL | Status: DC
  Administered 2024-01-10 – 2024-01-11 (×2): 10 mg via ORAL
  Filled 2024-01-10 (×2): qty 2

## 2024-01-10 MED ORDER — LACTATED RINGERS IV BOLUS
500.0000 mL | Freq: Once | INTRAVENOUS | Status: AC
  Administered 2024-01-10: 11:00:00 500 mL via INTRAVENOUS

## 2024-01-10 MED ORDER — POTASSIUM CHLORIDE CRYS ER 20 MEQ PO TBCR
20.0000 meq | EXTENDED_RELEASE_TABLET | Freq: Every day | ORAL | Status: DC
  Administered 2024-01-10 – 2024-01-11 (×2): 20 meq via ORAL
  Filled 2024-01-10 (×2): qty 1

## 2024-01-10 MED ORDER — LATANOPROST 0.005 % OP SOLN
1.0000 [drp] | Freq: Every day | OPHTHALMIC | Status: DC
  Administered 2024-01-10: 22:00:00 1 [drp] via OPHTHALMIC
  Filled 2024-01-10: qty 2.5

## 2024-01-10 MED ORDER — GABAPENTIN 300 MG PO CAPS
600.0000 mg | ORAL_CAPSULE | Freq: Three times a day (TID) | ORAL | Status: DC
  Administered 2024-01-10 – 2024-01-11 (×2): 600 mg via ORAL
  Filled 2024-01-10 (×2): qty 2

## 2024-01-10 MED ORDER — FUROSEMIDE 40 MG PO TABS
80.0000 mg | ORAL_TABLET | Freq: Every day | ORAL | Status: DC
  Administered 2024-01-10 – 2024-01-11 (×2): 80 mg via ORAL
  Filled 2024-01-10 (×3): qty 2

## 2024-01-10 MED ORDER — MORPHINE SULFATE (PF) 4 MG/ML IV SOLN
4.0000 mg | Freq: Once | INTRAVENOUS | Status: AC
  Administered 2024-01-10: 11:00:00 4 mg via INTRAVENOUS
  Filled 2024-01-10: qty 1

## 2024-01-10 NOTE — Consult Note (Signed)
 [facility] Psychiatric Consult Initial  Patient Name: .[name]  MRN: [id number]  DOB: August 13, 1970  Consult Order details:  Orders (From admission, onward)     Start     Ordered   01/10/24 1237  CONSULT TO CALL ACT TEAM       Ordering Provider: Doretha Folks, MD  Provider:  (Not yet assigned)  Question:  Reason for Consult?  Answer:  Psych consult   01/10/24 1237   01/10/24 1227  CONSULT TO CALL ACT TEAM       Ordering Provider: Doretha Folks, MD  Provider:  (Not yet assigned)  Question:  Reason for Consult?  Answer:  suidical and depression   01/10/24 1226             Mode of Visit: In person    Psychiatry Consult Evaluation  Service Date: January 10, 2024 LOS:  LOS: 0 days  Chief Complaint Suicidal ideation, depression, anxiety, and psychosocial stressors in the context of acute medical complaints  Primary Psychiatric Diagnoses  Major Depressive Disorder  2.   GAD  Assessment  Denise [name] is a 53 y.o. female admitted: Presented to the EDfor 01/10/2024  9:38 AM for Suicidal ideation, depression, anxiety, and psychosocial stressors in the context of acute medical complaints. She carries the psychiatric diagnoses of depression, anxiety and has a past medical history of COPD, chronic respiratory failure on 2 L O?, hypertension, diabetes mellitus, obstructive sleep apnea on CPAP, prior stroke.   The patient presents with severe depressive symptoms, anxiety, trauma-related symptoms, and passive suicidal ideation, occurring in the setting of significant psychosocial stressors, caregiver burden, housing instability, medical comorbidities, and active substance use. Although she denies plan or intent, her emotional distress, medication nonadherence, and lack of outpatient support place her at elevated risk.  Given her complex medical needs--including obesity, limited mobility, chronic oxygen use, and respiratory disease--immediate inpatient  psychiatric admission may be challenging. She would benefit from close monitoring, medication re-initiation, and reassessment of suicide risk once medically stabilized. Please see plan below for detailed recommendations.   Diagnoses:  Active Hospital problems: Principal Problem:   Major depressive disorder Active Problems:   GAD (generalized anxiety disorder)    Plan   ## Psychiatric Medication Recommendations:  Continue patient Wellbutrin  Atarax  25 mg PO TID PRN for anxiety  ## Medical Decision Making Capacity: Not specifically addressed in this encounter  ## Further Work-up:  -- No further workup needed at this time EKG or UDS -- most recent EKG on 01/10/2024 had QtC of 490   ## Disposition:-- Recommend overnight observation in the ED/observation unit with psychiatric reassessment in the morning to determine need for further psychiatric intervention versus safe discharge with outpatient follow-up.  ## Behavioral / Environmental: -Difficult Patient (SELECT OPTIONS FROM BELOW), To minimize splitting of staff, assign one staff person to communicate all information from the team when feasible., or Utilize compassion and acknowledge the patient's experiences while setting clear and realistic expectations for care.    ## Safety and Observation Level:  - Based on my clinical evaluation, I estimate the patient to be at moderate risk of self harm in the current setting. - At this time, we recommend  routine. This decision is based on my review of the chart including patient's history and current presentation, interview of the patient, mental status examination, and consideration of suicide risk including evaluating suicidal ideation, plan, intent, suicidal or self-harm behaviors, risk factors, and protective factors. This judgment is based on our ability to directly address  suicide risk, implement suicide prevention strategies, and develop a safety plan while the patient is in the clinical  setting. Please contact our team if there is a concern that risk level has changed.  CSSR Risk Category:C-SSRS RISK CATEGORY: Moderate Risk  Suicide Risk Assessment: Patient has following modifiable risk factors for suicide: untreated depression, medication noncompliance, and triggering events, which we are addressing by recommending inpatient psychiatric admission. Patient has following non-modifiable or demographic risk factors for suicide: psychiatric hospitalization Patient has the following protective factors against suicide: Supportive family, and Minor children in the home  Thank you for this consult request. Recommendations have been communicated to the primary team.  We will continue to follow patient at this time.   CATHALEEN ADAM, PMHNP       History of Present Illness  Relevant Aspects of Hospital ED Course:  Admitted on 01/10/2024 for Suicidal ideation, depression, anxiety, and psychosocial stressors in the context of acute medical complaints.  Patient Report:  The patient is a 53 year old female with a past medical history significant for COPD, chronic respiratory failure on 2 L O?, hypertension, diabetes mellitus, obstructive sleep apnea on CPAP, prior stroke, and a history of alcohol and cocaine use, who presented to the emergency department after awakening with generalized sharp upper-body pain. She additionally reports worsening shortness of breath, productive cough, and chest and abdominal pain.  During psychiatric evaluation, the patient reports feeling overwhelmed and emotionally exhausted, stating she is just tired. She describes significant caregiver stress, as she is the primary caretaker for her husband, who has multiple medical issues and minimal functional capacity. She reports their 63 year old son lives with them but provides no assistance. The patient states they are currently living in a motel [facility]) with limited space and resources.  She  expresses a desire to leave her current situation and return to [place] , where her mother, father, sister, and other children reside, stating she has no local support. She reports increasing alcohol use and admits to smoking crack cocaine three times in the past three months. She is noncompliant with psychiatric medications and does not recall prior psychiatric hospitalizations in 2019 and 2020 or previous medication trials.  Psychiatric Symptoms Daily depressed mood rated 9/10 Anhedonia Poor appetite (approximately one meal per day with snacking) Hypersomnia (sleeping both day and night) Negative self-thoughts Impaired concentration and increased forgetfulness Ongoing anxiety described as persistent rumination Trauma-related symptoms described as flashbacks and intrusive memories of the past Suicidal ideation without plan or intent Denies homicidal ideation and auditory/visual hallucinations  Plan / Recommendations Recommend overnight observation for continued safety monitoring and psychiatric reassessment. Restart patient on psychiatric medications as clinically appropriate and tolerated. Continue suicide precautions during observation period. Reassess mood, anxiety, and suicidal ideation in the morning. Social work consultation to address housing instability, caregiver burden, and support resources Substance use counseling and education provided; encourage engagement in treatment. Continue attempts to contact husband for collateral information.  Psych ROS:  Depression: Endorses Anxiety: Endorses Mania (lifetime and current): Denies Psychosis: (lifetime and current): Denies  Review of Systems  Psychiatric/Behavioral:  Positive for depression and suicidal ideas.      Psychiatric and Social History  Psychiatric History:  Information collected from patient and patient's chart  Prev Dx/Sx: Depression, anxiety, PTSD Current Psych Provider: Denies Home Meds (current): Yes, but  noncompliant Previous Med Trials: Yes Therapy:  Denies  Prior Psych Hospitalization: Yes Prior Self Harm:  Denies Prior Violence:  Denies  Family Psych History:  Denies Family Hx suicide:  Denies  Social History:  Developmental Hx: Deferred Educational Hx: Did not graduate high school Occupational Hx: Unemployed, receives disability Legal Hx:  Denies Living Situation: Lives at home with husband and son Spiritual Hx: Yes Access to weapons/lethal means:  Denies   Substance History Alcohol: Yes Type of alcohol varies Last Drink yesterday Number of drinks per day varies History of alcohol withdrawal seizures  Denies History of DT's  Denies Tobacco:  Denies Illicit drugs: Yes Prescription drug abuse:  Denies Rehab hx:  Denies  Exam Findings  Physical Exam:  Vital Signs:  Temp:  [98.4 F (36.9 C)] 98.4 F (36.9 C) (12/16 0945) Pulse Rate:  [81] 81 (12/16 0945) Resp:  [16] 16 (12/16 0945) BP: (133-152)/(69-81) 133/69 (12/16 1334) SpO2:  [100 %] 100 % (12/16 0945) Weight:  [121.6 kg] 121.6 kg (12/16 1000) Blood pressure 133/69, pulse 81, temperature 98.4 F (36.9 C), temperature source Oral, resp. rate 16, height 5' 5 (1.651 m), weight 121.6 kg, last menstrual period 05/29/2017, SpO2 100%. Body mass index is 44.61 kg/m.  Physical Exam Vitals reviewed.  Neurological:     Mental Status: She is alert.  Psychiatric:        Attention and Perception: Attention normal.        Mood and Affect: Mood is depressed. Affect is flat.        Speech: Speech normal.        Behavior: Behavior is cooperative.        Thought Content: Thought content includes suicidal ideation.        Judgment: Judgment is impulsive.     Mental Status Exam: General Appearance: Obese female, appears stated age, on supplemental oxygen  Orientation:  Full (Time, Place, and Person)  Memory:  Immediate;   Fair Remote;   Fair  Concentration:  Concentration: Fair and Attention Span: Fair  Recall:   Fair  Attention  Fair  Eye Contact:  Fair  Speech:  Clear and Coherent  Language:  Fair  Volume:  Normal  Mood: Depressed, anxious  Affect:  Constricted, congruent  Thought Process:  Linear but ruminative  Thought Content:  Passive suicidal ideation without plan; no delusions  Suicidal Thoughts:  Yes.  without intent/plan  Homicidal Thoughts:  No  Judgement:  Fair  Insight:  Fair  Psychomotor Activity:  Normal  Akathisia:  No  Fund of Knowledge:  Fair      Assets:  Manufacturing Systems Engineer Desire for Improvement Financial Resources/Insurance Housing Social Support  Cognition:  WNL  ADL's:  Impaired  AIMS (if indicated):        Other History   These have been pulled in through the EMR, reviewed, and updated if appropriate.  Family History:  The patient's family history includes Alcohol abuse in her mother; Anxiety disorder in her father and mother; Depression in her mother; Drug abuse in her father and mother; Heart disease in her maternal grandmother; Stroke in her mother.  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   Chronic diastolic CHF (congestive heart failure) 07/29/2021   Class 3 obesity with alveolar hypoventilation, serious comorbidity, and body mass index (BMI) of 50.0 to 59.9 in adult 06/12/2020   CVA (cerebrovascular accident) 2017   2017 MRI - chronic left lenticulostriate territory hemorrhage or hemorrhagic infarct, with hemosiderin,  and encephalomalacia affecting both the caudate and lentiform nucleus     Decreased mobility 06/15/2021   DOE (dyspnea on exertion) 06/12/2020   Essential hypertension 12/18/2014   Fibroid uterus 12/18/2014   Heart murmur, systolic 06/29/2021   History of alcohol abuse 02/16/2017   History of cocaine use 02/16/2017    History of latent syphilis 02/16/2017   Hyperlipidemia associated with type 2 diabetes mellitus 11/07/2020   Incontinence of urine    Lacunar infarction    MRI - chronic lacunar infarct within the posterior right lentiform nucleus.     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   Low back pain 11/21/2017   Lumbar degenerative disc disease    Major depressive disorder 02/16/2017   Myocardial infarction (HCC)    2015 in [facility]   Nuclear sclerosis of both eyes 10/20/2022   OSA on CPAP 06/12/2020   Overactive bladder    Pain due to onychomycosis of toenails of both feet 05/26/2020   Pelvic pain in female 12/18/2014   Psychogenic nonepileptic seizure 02/17/2017   PTSD (post-traumatic stress disorder)    Pulmonary emphysema 08/31/2021   QT prolongation 06/29/2021   Right sided weakness    Social anxiety disorder 11/03/2017   Syncope 02/07/2015   TIA (transient ischemic attack)    Tobacco use 04/14/2015   Type 2 diabetes mellitus 10/21/2017   Venous stasis dermatitis of both lower extremities 06/15/2021    Surgical History: Past Surgical History:  Procedure Laterality Date   CESAREAN SECTION     x 2     Medications:  Current Medications[1]  Allergies: Allergies[2]  Saira Kramme MOTLEY-MANGRUM, PMHNP      [1]  Current Facility-Administered Medications:    albuterol  (VENTOLIN  HFA) 108 (90 Base) MCG/ACT inhaler 2 puff, 2 puff, Inhalation, Q6H PRN, Doretha Folks, MD   amLODipine  (NORVASC ) tablet 10 mg, 10 mg, Oral, Daily, Plunkett, Whitney, MD, 10 mg at 01/10/24 1335   atorvastatin  (LIPITOR) tablet 20 mg, 20 mg, Oral, Daily, Plunkett, Whitney, MD, 20 mg at 01/10/24 1335   budesonide -glycopyrrolate -formoterol  (BREZTRI ) 160-9-4.8 MCG/ACT inhaler 2 puff, 2 puff, Inhalation, BID, Plunkett, Whitney, MD   buPROPion  (WELLBUTRIN  SR) 12 hr tablet 150 mg, 150 mg, Oral, BID, Doretha, Whitney, MD, 150 mg at 01/10/24 1334   carbamazepine  (TEGRETOL ) tablet 400 mg, 400 mg, Oral, BID, Plunkett, Whitney,  MD   furosemide  (LASIX ) tablet 80 mg, 80 mg, Oral, Daily, Plunkett, Whitney, MD, 80 mg at 01/10/24 1334   gabapentin  (NEURONTIN ) capsule 600 mg, 600 mg, Oral, TID, Doretha Folks, MD   guaiFENesin  (MUCINEX ) 12 hr tablet 1,200 mg, 1,200 mg, Oral, BID, Plunkett, Whitney, MD, 1,200 mg at 01/10/24 1335   hydrOXYzine  (ATARAX ) tablet 25 mg, 25 mg, Oral, TID PRN, Motley-Mangrum, Klark Vanderhoef A, PMHNP   latanoprost  (XALATAN ) 0.005 % ophthalmic solution 1 drop, 1 drop, Both Eyes, QHS, Plunkett, Whitney, MD   lisinopril  (ZESTRIL ) tablet 30 mg, 30 mg, Oral, Daily, Plunkett, Whitney, MD, 30 mg at 01/10/24 1334   metFORMIN  (GLUCOPHAGE ) tablet 500 mg, 500 mg, Oral, BID, Plunkett, Whitney, MD, 500 mg at 01/10/24 1335   olopatadine  (PATANOL) 0.1 % ophthalmic solution 1 drop, 1 drop, Both Eyes, BID, Plunkett, Whitney, MD   oxyCODONE -acetaminophen  (PERCOCET/ROXICET) 5-325 MG per tablet 1 tablet, 1 tablet, Oral, Q8H PRN, Plunkett, Whitney, MD, 1 tablet at 01/10/24 1239   pantoprazole  (PROTONIX ) EC tablet 40 mg, 40 mg, Oral, Daily, Plunkett, Whitney, MD, 40 mg at 01/10/24 1334   potassium chloride  SA (KLOR-CON  M) CR tablet 20 mEq, 20 mEq, Oral, Daily, Plunkett,  Whitney, MD, 20 mEq at 01/10/24 1334  Current Outpatient Medications:    amLODipine  (NORVASC ) 10 MG tablet, Take 10 mg by mouth daily., Disp: , Rfl:    Potassium Chloride  ER 20 MEQ TBCR, Take 1 tablet by mouth daily., Disp: , Rfl:    atorvastatin  (LIPITOR) 20 MG tablet, Take 1 tablet (20 mg total) by mouth daily. Pt needs to keep upcoming appt in July for further refills, Disp: 30 tablet, Rfl: 0   budesonide -glycopyrrolate -formoterol  (BREZTRI  AEROSPHERE) 160-9-4.8 MCG/ACT AERO inhaler, Inhale 2 puffs into the lungs in the morning and at bedtime., Disp: 10.7 g, Rfl: 10   buPROPion  (WELLBUTRIN  SR) 150 MG 12 hr tablet, 1 tablet twice daily, Disp: 60 tablet, Rfl: 3   carbamazepine  (TEGRETOL ) 200 MG tablet, Take 2 tablets (400 mg total) by mouth 2 (two) times daily.,  Disp: 360 tablet, Rfl: 1   furosemide  (LASIX ) 80 MG tablet, TAKE 1 TABLET BY MOUTH EVERY DAY, Disp: 15 tablet, Rfl: 0   gabapentin  (NEURONTIN ) 300 MG capsule, Take 2 capsules (600 mg total) by mouth 3 (three) times daily., Disp: 180 capsule, Rfl: 6   guaiFENesin  (MUCINEX ) 600 MG 12 hr tablet, Take 2 tablets (1,200 mg total) by mouth 2 (two) times daily., Disp: 30 tablet, Rfl: 0   latanoprost  (XALATAN ) 0.005 % ophthalmic solution, Apply 1 drop to eye., Disp: , Rfl:    lisinopril  (ZESTRIL ) 30 MG tablet, Take 30 mg by mouth daily., Disp: , Rfl:    metFORMIN  (GLUCOPHAGE ) 500 MG tablet, Take 500 mg by mouth 2 (two) times daily., Disp: , Rfl: 3   nicotine  (NICODERM CQ  - DOSED IN MG/24 HOURS) 14 mg/24hr patch, RX #2 Weeks 5-6: 14 mg x 1 patch daily. Wear for 24 hours. If you have sleep disturbances, remove at bedtime. (Patient not taking: Reported on 12/23/2023), Disp: 14 patch, Rfl: 0   Olopatadine  HCl 0.2 % SOLN, Apply 1 drop to eye., Disp: , Rfl:    pantoprazole  (PROTONIX ) 40 MG tablet, Take 1 tablet (40 mg total) by mouth daily., Disp: 30 tablet, Rfl: 0   Polyethyl Glycol-Propyl Glycol (SYSTANE ULTRA) 0.4-0.3 % SOLN, Apply 1 drop to eye., Disp: , Rfl:    potassium chloride  SA (KLOR-CON  M) 20 MEQ tablet, Take 1 tablet (20 mEq total) by mouth daily., Disp: 30 tablet, Rfl: 1   senna-docusate (SENOKOT-S) 8.6-50 MG tablet, Take 1 tablet by mouth at bedtime as needed for mild constipation., Disp: , Rfl:    VENTOLIN  HFA 108 (90 Base) MCG/ACT inhaler, inhale 2 PUFFS into THE lungs EVERY 6 HOURS AS NEEDED FOR WHEEZING OR SHORTNESS OF BREATH, Disp: 18 g, Rfl: 11 [2]  Allergies Allergen Reactions   Gadolinium Derivatives Nausea And Vomiting and Other (See Comments)    Nausea and vomiting was followed by epileptic seizure episode that lasted approximately 5 minutes; pt was unable to verbally communicate during that time; she then came to and was able to speak; was evaluated by Rad and RN; kms    Grapefruit  Flavor [Flavoring Agent (Non-Screening)] Other (See Comments)    Drug interaction

## 2024-01-10 NOTE — Telephone Encounter (Signed)
*  Pulm  Pharmacy Patient Advocate Encounter   Received notification from Pt Calls Messages that prior authorization for Breztri  is required/requested.   Insurance verification completed.   The patient is insured through [facility].   Per test claim: PA required; PA started via CoverMyMeds. KEY [id number] . Waiting for clinical questions to populate.

## 2024-01-10 NOTE — ED Notes (Signed)
 Patient dressed out in purple scrubs and security has been called to wand patient. Patient room door has been left open for monitoring.

## 2024-01-10 NOTE — ED Notes (Addendum)
 Pt resting.

## 2024-01-10 NOTE — Telephone Encounter (Signed)
 Nicholaus Hermina JAYSON Render, Christian LENORA Epley, Jennet LABOR, MD; Shelah Lamar RAMAN, MD APPROVED#[id number] .SABRAVALID UNTIL 03/11/2024       Previous Messages    ----- Message ----- From: Render Sherlean SAILOR Sent: 12/27/2023   4:55 PM EST To: Lamar RAMAN Shelah, MD; Jennet LABOR Epley, MD; S* Subject: RE: pt canceled today                          Patient has been rescheduled to 12/8 at 3:00pm for the bronch and 12/15 at 11:00am to see Lauraine Lites. Routing to [employer] for Authorization. ----- Message ----- From: Maurice Clotilda ORN, RN Sent: 12/26/2023   9:45 AM EST To: Lamar RAMAN Shelah, MD; Jennet LABOR Epley, MD; C* Subject: pt canceled today                              Hey, Just wanted to let everyone know that she canceled her nav bronch for today due to issues with her husband. I told her I would let the office know she needed to get rescheduled. Thanks!  Clotilda

## 2024-01-10 NOTE — Telephone Encounter (Signed)
 Your request has been approved Request Reference Number: [id number]. BREZTRI  AERO AER SPHERE is approved through 01/09/2025. For further questions, call [employer] at [phone]. Authorization Expiration12/16/2026

## 2024-01-10 NOTE — ED Notes (Signed)
 Two bags of patient belongings collected and placed in 19-22 patient belongings cabinet. One white belongings bag and one beige backpack.

## 2024-01-10 NOTE — Telephone Encounter (Signed)
 Copied from CRM #[id number]. Topic: Clinical - Medication Prior Auth >> Jan 09, 2024  9:48 AM Joesph PARAS wrote: Reason for CRM: Pharmacy for patients Breztri  is calling to verify we have received PA request for patient's medication sent just a few moments ago. C/B [phone].

## 2024-01-10 NOTE — ED Triage Notes (Addendum)
 Pt BIB EMS from home waking up feeling generalized sharp pain in upper body.  2 day sago she was dx w pneumonia. EMS states pt has unsteadiness with mvmnt that started last night. Pt staes she is compliant w medications and abd regimen. Pt currently disoriented to time and slow to answer   EMS vitals  BP 140/80 HR 80 SPO2 95 on 2L Her baseline  CBG 130

## 2024-01-10 NOTE — Telephone Encounter (Signed)
 2 x called. LVDM about appointment. Read off letter in VM. Sending letter to patient's home now.  PT scheduled 01/23/24.

## 2024-01-10 NOTE — ED Provider Notes (Signed)
 [place] EMERGENCY DEPARTMENT AT [facility] Provider Note   CSN: [id number] Arrival date & time: 01/10/24  9064     Patient presents with: Shortness of Breath   Denise [name] is a 53 y.o. female.   Pt is a 53y/o female with hx of COPD, chronic respiratory failure on 2 L of oxygen, hypertension, alcohol and cocaine abuse, diabetes, OSA on CPAP, stroke presents with complaints of worsening shortness of breath, productive cough and pain in her chest and abdomen.  She cannot specifically state how long she has been short of breath but reports she started having pain in her chest and abdomen since last night.  It is pleuritic in nature and worse with movements.  She has had some nausea and occasionally been spitting up some things but is not having frank vomiting.  She also reports she has had some diarrhea.  She has not noticed any leg swelling and is unsure if she has had a fever or not.  States she was diagnosed with pneumonia recently however there is no evidence of that in the chart.  She was seen earlier this month and they talk to her yesterday as she is scheduled for bronchoscopy due to concern for metastatic disease and has chronic fatigue.  However now she reports she is so short of breath she is even having trouble getting up and moving around.  She is using her inhalers at home but it is not helping.  She is compliant with her medications.  Taking naproxen for pain.  The history is provided by the patient.  Shortness of Breath      Prior to Admission medications  Medication Sig Start Date End Date Taking? Authorizing Provider  amLODipine  (NORVASC ) 10 MG tablet Take 10 mg by mouth daily. 05/25/23   [provider]  atorvastatin  (LIPITOR) 20 MG tablet Take 1 tablet (20 mg total) by mouth daily. Pt needs to keep upcoming appt in July for further refills 07/20/23   Santo Stanly LABOR, MD  budesonide -glycopyrrolate -formoterol  (BREZTRI  AEROSPHERE)  160-9-4.8 MCG/ACT AERO inhaler Inhale 2 puffs into the lungs in the morning and at bedtime. 10/11/23   Cobb, Comer GAILS, NP  buPROPion  (WELLBUTRIN  SR) 150 MG 12 hr tablet 1 tablet twice daily 12/20/23   Olalere, Adewale A, MD  carbamazepine  (TEGRETOL ) 200 MG tablet Take 2 tablets (400 mg total) by mouth 2 (two) times daily. 12/15/23   Jaffe, Adam R, DO  furosemide  (LASIX ) 80 MG tablet TAKE 1 TABLET BY MOUTH EVERY DAY 11/10/23   Chandrasekhar, Mahesh A, MD  gabapentin  (NEURONTIN ) 300 MG capsule Take 2 capsules (600 mg total) by mouth 3 (three) times daily. 12/15/23   Skeet Juliene SAUNDERS, DO  guaiFENesin  (MUCINEX ) 600 MG 12 hr tablet Take 2 tablets (1,200 mg total) by mouth 2 (two) times daily. 12/02/23   Regalado, Owen A, MD  latanoprost  (XALATAN ) 0.005 % ophthalmic solution Apply 1 drop to eye. 09/19/23 09/18/24  [provider]  lisinopril  (ZESTRIL ) 30 MG tablet Take 30 mg by mouth daily. 03/29/23   [provider]  metFORMIN  (GLUCOPHAGE ) 500 MG tablet Take 500 mg by mouth 2 (two) times daily. 05/25/17   [provider]  nicotine  (NICODERM CQ  - DOSED IN MG/24 HOURS) 14 mg/24hr patch RX #2 Weeks 5-6: 14 mg x 1 patch daily. Wear for 24 hours. If you have sleep disturbances, remove at bedtime. Patient not taking: Reported on 12/23/2023 12/02/23   Regalado, Owen A, MD  Olopatadine  HCl 0.2 % SOLN  Apply 1 drop to eye. 09/19/23   [provider]  pantoprazole  (PROTONIX ) 40 MG tablet Take 1 tablet (40 mg total) by mouth daily. 12/02/23 01/01/24  Regalado, Belkys A, MD  Polyethyl Glycol-Propyl Glycol (SYSTANE ULTRA) 0.4-0.3 % SOLN Apply 1 drop to eye. 09/19/23   [provider]  potassium chloride  SA (KLOR-CON  M) 20 MEQ tablet Take 1 tablet (20 mEq total) by mouth daily. 08/01/21   Jillian Buttery, MD  senna-docusate (SENOKOT-S) 8.6-50 MG tablet Take 1 tablet by mouth at bedtime as needed for mild constipation. 02/20/23   Cherlyn Labella, MD  VENTOLIN  HFA 108 (90 Base) MCG/ACT  inhaler inhale 2 PUFFS into THE lungs EVERY 6 HOURS AS NEEDED FOR WHEEZING OR SHORTNESS OF BREATH 02/15/22   Sood, Vineet, MD    Allergies: Gadolinium derivatives and Grapefruit flavor [flavoring agent (non-screening)]    Review of Systems  Respiratory:  Positive for shortness of breath.     Updated Vital Signs BP (!) 152/81 (BP Location: Right Arm)   Pulse 81   Temp 98.4 F (36.9 C) (Oral)   Resp 16   Ht 5' 5 (1.651 m)   Wt 121.6 kg   LMP 05/29/2017   SpO2 100%   BMI 44.61 kg/m   Physical Exam Vitals and nursing note reviewed.  Constitutional:      General: She is not in acute distress.    Appearance: She is well-developed.     Comments: Tearful on exam  HENT:     Head: Normocephalic and atraumatic.  Eyes:     Pupils: Pupils are equal, round, and reactive to light.  Cardiovascular:     Rate and Rhythm: Normal rate and regular rhythm.     Pulses: Normal pulses.     Heart sounds: Normal heart sounds. No murmur heard.    No friction rub.  Pulmonary:     Effort: Pulmonary effort is normal. Tachypnea present.     Breath sounds: Examination of the right-middle field reveals decreased breath sounds. Examination of the left-middle field reveals decreased breath sounds. Examination of the right-lower field reveals decreased breath sounds. Examination of the left-lower field reveals decreased breath sounds. Decreased breath sounds present. No wheezing or rales.  Chest:     Chest wall: Tenderness present.  Abdominal:     General: Bowel sounds are normal. There is no distension.     Palpations: Abdomen is soft.     Tenderness: There is abdominal tenderness. There is no guarding or rebound.     Comments: Diffuse abd and chest pain  Musculoskeletal:        General: No tenderness. Normal range of motion.     Right lower leg: No edema.     Left lower leg: No edema.     Comments: No edema  Skin:    General: Skin is warm and dry.     Findings: No rash.  Neurological:     Mental  Status: She is alert.     Cranial Nerves: No cranial nerve deficit.     Sensory: No sensory deficit.     Motor: No weakness.     Comments: Slightly slow with answering questions and was confused about what year it was but otherwise is able to answer all other questions  Psychiatric:        Behavior: Behavior normal.     Comments: tearful     (all labs ordered are listed, but only abnormal results are displayed) Labs Reviewed  COMPREHENSIVE METABOLIC PANEL WITH  GFR - Abnormal; Notable for the following components:      Result Value   Glucose, Bld 103 (*)    Alkaline Phosphatase 160 (*)    All other components within normal limits  BLOOD GAS, VENOUS - Abnormal; Notable for the following components:   pO2, Ven 69 (*)    All other components within normal limits  RESP PANEL BY RT-PCR (RSV, FLU A&B, COVID)  RVPGX2  CBC WITH DIFFERENTIAL/PLATELET  URINE DRUG SCREEN  ETHANOL  I-STAT VENOUS BLOOD GAS, ED  TROPONIN T, HIGH SENSITIVITY  TROPONIN T, HIGH SENSITIVITY    EKG: EKG Interpretation Date/Time:  Tuesday January 10 2024 09:45:06 EST Ventricular Rate:  84 PR Interval:  156 QRS Duration:  87 QT Interval:  414 QTC Calculation: 490 R Axis:   33  Text Interpretation: Sinus rhythm Low voltage, precordial leads Nonspecific T abnormalities, anterior leads unchanged from prior new Borderline prolonged QT interval Confirmed by Doretha Folks ([id number]) on 01/10/2024 11:27:15 AM  Radiology: [facility] Chest Port 1 View Result Date: 01/10/2024 CLINICAL DATA:  Shortness of breath, chest pain EXAM: PORTABLE CHEST 1 VIEW COMPARISON:  November 30, 2023 FINDINGS: The heart size and mediastinal contours are within normal limits. Both lungs are clear. The visualized skeletal structures are unremarkable. IMPRESSION: No active disease. Electronically Signed   By: Lynwood Landy Raddle M.D.   On: 01/10/2024 10:34     Procedures   Medications Ordered in the ED  oxyCODONE -acetaminophen  (PERCOCET/ROXICET)  5-325 MG per tablet 1 tablet (has no administration in time range)  amLODipine  (NORVASC ) tablet 10 mg (has no administration in time range)  atorvastatin  (LIPITOR) tablet 20 mg (has no administration in time range)  budesonide -glycopyrrolate -formoterol  (BREZTRI ) 160-9-4.8 MCG/ACT inhaler 2 puff (has no administration in time range)  buPROPion  (WELLBUTRIN  SR) 12 hr tablet 150 mg (has no administration in time range)  carbamazepine  (TEGRETOL ) tablet 400 mg (has no administration in time range)  furosemide  (LASIX ) tablet 80 mg (has no administration in time range)  gabapentin  (NEURONTIN ) capsule 600 mg (has no administration in time range)  guaiFENesin  (MUCINEX ) 12 hr tablet 1,200 mg (has no administration in time range)  latanoprost  (XALATAN ) 0.005 % ophthalmic solution 1 drop (has no administration in time range)  lisinopril  (ZESTRIL ) tablet 30 mg (has no administration in time range)  metFORMIN  (GLUCOPHAGE ) tablet 500 mg (has no administration in time range)  olopatadine  (PATANOL) 0.1 % ophthalmic solution 1 drop (has no administration in time range)  pantoprazole  (PROTONIX ) EC tablet 40 mg (has no administration in time range)  albuterol  (VENTOLIN  HFA) 108 (90 Base) MCG/ACT inhaler 2 puff (has no administration in time range)  potassium chloride  SA (KLOR-CON  M) CR tablet 20 mEq (has no administration in time range)  morphine  (PF) 4 MG/ML injection 4 mg (4 mg Intravenous Given 01/10/24 1035)  lactated ringers  bolus 500 mL (500 mLs Intravenous New Bag/Given 01/10/24 1043)                                    Medical Decision Making Amount and/or Complexity of Data Reviewed Independent Historian: EMS External Data Reviewed: notes. Labs: ordered. Decision-making details documented in ED Course. Radiology: ordered and independent interpretation performed. Decision-making details documented in ED Course. ECG/medicine tests: ordered and independent interpretation performed. Decision-making  details documented in ED Course.  Risk OTC drugs. Prescription drug management.   Pt with multiple medical problems and comorbidities and presenting today with  a complaint that caries a high risk for morbidity and mortality.  Here today with the above complaints.  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  Today concern for possible pneumonia versus viral etiology.  She is not having significant wheezing and oxygen is at 100% on her 2 L.  Low suspicion for COPD exacerbation at this time.  Patient was seen approximately a month ago with similar symptoms and that time had a CTA which ruled out dissection but showed the concern for malignancy.  She reports that she was diagnosed with pneumonia yesterday but there is no chart evidence of that does not appear that she is actually on any antibiotics at this time and she may have some mild confusion.  Will rule out hypercarbia as well as infection at this time.  Lower suspicion for PE as patient is not tachycardic or hypoxic and did not have a PE a month ago.  She has no evidence concerning for CHF exacerbation but looks dehydrated.  Given gentle fluids and pain control at this time while getting workup. 12:36 PM I dependently interpreted patient's labs and EKG.  EKG without acute changes today, blood gas, CBC, CMP, troponin and viral swabs are all negative.  I have independently visualized and interpreted pt's images today. X-ray is within normal limits.  On repeat evaluation when discussing all the findings with the patient concerned that the pain she is experiencing may be from these known masses that are still currently under evaluation and she plans on getting a bronchoscopy at the end of the month.  After discussing this with the patient discussing possible stronger pain control at home she breaks down in tears and reports that she needs mental  health help.  She reports she has been down on herself for quite a while and she has become suicidal.  She reports she would probably overdose on pills and she has tried to hurt herself in the past.  In the past she has been treated for depression but reports she is not taking anything for depression at this time but based on her med list she is on Wellbutrin .  She continually repeatedly says she is just so tired.  She is tired of feeling sick and unwell and nobody appreciates her at home.  At this time patient has been hemodynamically stable.  Has no acute medical issues but ongoing chronic ones.  They seem to be at their baseline.  Will have TTS evaluate and will give patient pain control as needed.     Final diagnoses:  Chest pain on breathing  Lesion of lung  Suicidal ideation    ED Discharge Orders     None          Doretha Folks, MD 01/10/24 [phone]

## 2024-01-11 DIAGNOSIS — F411 Generalized anxiety disorder: Secondary | ICD-10-CM

## 2024-01-11 MED ORDER — BUPROPION HCL ER (SR) 150 MG PO TB12: 150.0000 mg | ORAL_TABLET | Freq: Two times a day (BID) | ORAL | 0 refills | Status: DC

## 2024-01-11 NOTE — ED Notes (Signed)
 Calling [facility] for transport of pt to home.  [facility] advises it will be awhile due to many transports.

## 2024-01-11 NOTE — Discharge Instructions (Addendum)
 Discharge recommendations:   Medications: Patient is to take medications as prescribed. The patient or patient's guardian is to contact a medical professional and/or outpatient provider to address any new side effects that develop. The patient or the patient's guardian should update outpatient providers of any new medications and/or medication changes.   Outpatient Follow up: Please call [facility] to schedule a new patient appointment with psychiatry. Please see below for additional outpatient psychiatry facilities.   [facility] at [facility]: [address] [place], [place], [place] [address] [phone]  Outpatient Services for Therapy and Medication Management for [facility] [address], [place], [address] [phone] phone  New Patient Assessment/Therapy Walk-ins Monday and Wednesday: 8am until slots are full. Every 1st and 2nd Friday: 1pm - 5pm  NO ASSESSMENT/THERAPY WALK-INS ON TUESDAYS OR THURSDAYS  New Patient Psychiatry/Medication Management Walk-ins Monday-Friday: 8am-11am  For all walk-ins, we ask that you arrive by 7:30am because patient will be seen in the order of arrival.  Availability is limited; therefore, you may not be seen on the same day that you walk-in.  Our goal is to serve and meet the needs of our community to the best of our ability.  [facility] [address] [place], [place], [address] [phone] phone  [facility] [address] [place], [place], [address] [phone] phone [phone] phone ([address], Anthem[employer]Elevance, [address], [place], [address], [address], Healthy [place], [place], [place], [address], [employer], [place], UHC, [employer], [place], Out of Network)  [employer], [place] [address] [place], [place], [address] [phone] phone ([place], Anthem/Elevance, San[employer]-sci Options/Carelon, BCBS, [address], [place], [place], [place], [place], [employer], [place], [place], [place], [facility])  [employer] [address] [place], [place], [address] [phone] phone (Medicaid, ask about other insurance)  [facility] [address], [address] [place], [place], [address] [phone] phone [phone] fax ([address], [place] , [place], [place], [place] Health Choice, UHC, GENERAL ELECTRIC, Self-Pay)  Sarah Lempka [address] [place], [place], [address] [phone] phone ([address], Anthem/Elevance, [address], [address], [place], [employer], [place], [place], [place], [employer], [place], [place], [place], [facility])  [facility] - 6-8 MONTH WAIT FOR THERAPY; SOONER FOR MEDICATION MANAGEMENT [address] [place], [place], [address] [phone] phone ([address] [employer] [address] - [place], [address], [place], [place], Friday Health Plans, [address], BCBS Healthy [place], [place], [address], [place], [place], [place], [place], Tricare, UHC, [employer], [place])  [facility] [address], [address] [place], [place], [address] [phone] phone  [facility] [address] [place], [place], [address] [phone] phone  [facility] [address], [address] [place], [place], [address] [phone] phone  [facility] - THERAPY ONLY [address]  [place], [place], [address] [phone] phone  [facility], [place] [address], [place], [address] [phone] phone  [facility] [address] [place], [place], [address] [phone] phone [phone] fax  [facility] [address] [place], [place], [address] [phone] phone [phone]  fax  [facility] [phone] [address] [place], [place], [address] [phone] phone  Janit Griffins [address] [place], [place], [address]  [phone] phone  [facility]  (Adults Only) [address]  [place], [place], [address]  [phone] phone [phone] fax  Therapy: We recommend that patient participate in individual therapy to address mental health concerns.  Safety:   The following safety precautions should be taken:   No sharp objects. This includes scissors, razors, scrapers, and putty knives.   Chemicals should be removed and locked up.   Medications should be removed and locked up.   Weapons should be removed and locked up. This includes firearms, knives and instruments that can be used to cause injury.   The patient should abstain from use of illicit substances/drugs and abuse of any medications.  If symptoms worsen or do not continue to improve or if the patient becomes actively suicidal or homicidal then it is recommended that the patient return to the closest hospital emergency department, the [facility], or call 911 for further evaluation and treatment. National Suicide Prevention Lifeline 1-800-SUICIDE or 904 824 7484.  About 988 988 offers 24/7 access to trained crisis counselors who can help people experiencing mental health-related distress. People can call or text 988 or chat 988lifeline.org for themselves or if they are worried about a loved one who may need crisis support.

## 2024-01-11 NOTE — Consult Note (Signed)
 [facility] Psychiatric Consult Initial  Patient Name: .Denise [name]  MRN: [id number]  DOB: [birth year]  Consult Order details:  Orders (From admission, onward)     Start     Ordered   01/10/24 1237  CONSULT TO CALL ACT TEAM       Ordering Provider: Doretha Folks, MD  Provider:  (Not yet assigned)  Question:  Reason for Consult?  Answer:  Psych consult   01/10/24 1237   01/10/24 1227  CONSULT TO CALL ACT TEAM       Ordering Provider: Doretha Folks, MD  Provider:  (Not yet assigned)  Question:  Reason for Consult?  Answer:  suidical and depression   01/10/24 1226             Mode of Visit: In person    Psychiatry Consult Evaluation  Service Date: January 11, 2024  Chief Complaint passive suicidal ideation, depression, anxiety, and psychosocial stressors in the context of acute medical complaints  Primary Psychiatric Diagnoses  Major Depressive Disorder  2.   GAD  Assessment  [name] is a 53 y.o. female admitted: Presented to the ED on 01/10/2024  9:38 AM for passive suicidal ideation, depression, anxiety, and psychosocial stressors in the context of acute medical complaints. She carries the psychiatric diagnoses of depression, anxiety and has a past medical history of COPD, chronic respiratory failure on 2 L O?, hypertension, diabetes mellitus, obstructive sleep apnea on CPAP, prior stroke.   The patient presents with severe depressive symptoms, anxiety, trauma-related symptoms, and passive suicidal ideation, occurring in the setting of significant psychosocial stressors, caregiver burden, housing instability, medical comorbidities, and active substance use. She was observed overnight for safety and restarted on Wellbutrin  SR 150 mg po BID for depression and anxiety and reevaluated on 01/11/24. On re-evaluation, patient denied SI/HI/AVH. Objectively, there was no evidence of acute psychosis on exam. She continued to endorse depressive and  anxiety symptoms with a reduction when compared to yesterday's presentation. She was observed overnight without any disruptive, aggressive, psychotic or self-harm behaviors.   Diagnoses:  Active Hospital problems: Principal Problem:   Major depressive disorder Active Problems:   GAD (generalized anxiety disorder)    Plan   ## Psychiatric Medication Recommendations:  Continue Wellbutrin  SR 150 mg po BID for depression and anxiety. Will e-prescribed x 14 days.   ## Medical Decision Making Capacity: Not specifically addressed in this encounter  ## Further Work-up:  -- No further workup needed at this time -- most recent EKG on 01/10/2024 had QtC of 490 -Labs reviewed: CMP, CBC, UDS, BAL and EKG   ## Disposition:-- Patient psychiatrically cleared  by psychiatry. Patient does not appear to be at imminent risk of dangerousness to self or others at this time. While future psychiatric events cannot be accurately predicted, the patient does not necessitate nor desire further acute inpatient psychiatric care at this time. The patient does not meet [place]  involuntary commitment criteria at this time.   Discharge recommendations:   Medications: Patient is to take medications as prescribed. The patient or patient's guardian is to contact a medical professional and/or outpatient provider to address any new side effects that develop. The patient or the patient's guardian should update outpatient providers of any new medications and/or medication changes.   Outpatient Follow up: Please call [facility] at [facility] to schedule a new patient appointment with psychiatry. Please see below for additional outpatient psychiatry facilities.   [facility] Medicine at [facility]  Address: [address] [place], [place], [place] [address] [phone]  Outpatient Services for Therapy and Medication Management for [facility] [address] [place], [place], [address] [phone] phone  New Patient Assessment/Therapy Walk-ins Monday and Wednesday: 8am until slots are full. Every 1st and 2nd Friday: 1pm - 5pm  NO ASSESSMENT/THERAPY WALK-INS ON TUESDAYS OR THURSDAYS  New Patient Psychiatry/Medication Management Walk-ins Monday-Friday: 8am-11am  For all walk-ins, we ask that you arrive by 7:30am because patient will be seen in the order of arrival.  Availability is limited; therefore, you may not be seen on the same day that you walk-in.  Our goal is to serve and meet the needs of our community to the best of our ability.  [facility] [address] [place], [place], [address] [phone] phone  [facility] [address] [place], [place], [address] [phone] phone [phone] phone ([address], Anthem/Elevance, [address], [place], [address], [address], Healthy [place], [place], [place], [address], [employer]ophillips, [place], UHC, [employer], [place], Out of Network)  [employer], [place] [address] [place], [place], [address] [phone] phone ([place], Anthem/Elevance, Sanmina-sci Options/Carelon, BCBS, [address], [place], [place], [place], [place], [employer], [place], [place], [place], [facility])  [employer] [address] [place], [place], [address] [phone] phone (Medicaid, ask about other insurance)  [facility] [address] [place], [place], [address] [phone] phone [phone] fax ([address] [place] , [place], [place], [place] Health Choice, UHC, GENERAL ELECTRIC, Self-Pay)  Sarah Lempka [address] [place], [place], [address] [phone] phone ([address] Anthem/Elevance, [address], [address], [place], [employer], [place], [place], [place], [employer], [place], [place], [place], [facility])  [facility] - 6-8 MONTH WAIT FOR THERAPY; [facility] FOR MEDICATION MANAGEMENT [address], [address] [place], [place], [address] [phone] phone ([address] AmeriHealth [address] [place], [address], [place], [place], Friday Health Plans, [address], BCBS Healthy [place], [place], [address], [place], [place], [place], [place], Tricare, UHC, [employer]co Corporation, [place])  [facility] [address], [address] [place], [place], [address] [phone] phone  [facility] [address], [address] [place], [place], [address] [phone] phone  [facility] [address], [address] [place], [place], [address] [phone] phone  [facility] - THERAPY ONLY [address]  [place], [place], [address] [phone] phone  [facility], [place] [address] [place], [place], [address] [phone] phone  [facility] [address] [place], [place], [address] [phone] phone [phone] fax  [facility] [address] [place], [place], [address] [phone] phone [phone] fax  [facility] [phone] [address] [place], [place], [address] [phone] phone  Janit Griffins [address] [place], [place], [address]  [phone] phone  [facility]  (Adults Only) [address] [place], [place], [address]  [phone] phone [phone] fax  Therapy: We recommend that patient participate in individual therapy to address mental health concerns.  Safety:   The following safety precautions should be taken:   No sharp objects. This includes scissors, razors, scrapers, and putty knives.   Chemicals should be removed and locked up.   Medications should be removed and locked up.   Weapons should be removed and locked up. This includes firearms, knives and instruments that can be used to cause injury.   The patient should  abstain from use of  illicit substances/drugs and abuse of any medications.  If symptoms worsen or do not continue to improve or if the patient becomes actively suicidal or homicidal then it is recommended that the patient return to the closest hospital emergency department, the [facility], or call 911 for further evaluation and treatment. National Suicide Prevention Lifeline 1-800-SUICIDE or 854-074-3916.  About 988 988 offers 24/7 access to trained crisis counselors who can help people experiencing mental health-related distress. People can call or text 988 or chat 988lifeline.org for themselves or if they are worried about a loved one who may need crisis support.    ## Behavioral / Environmental: - No specific recommendations at this time.     ## Safety and Observation Level:  - Based on my clinical evaluation, I estimate the patient to be at low risk of self harm in the current setting. - At this time, we recommend  routine. This decision is based on my review of the chart including patient's history and current presentation, interview of the patient, mental status examination, and consideration of suicide risk including evaluating suicidal ideation, plan, intent, suicidal or self-harm behaviors, risk factors, and protective factors. This judgment is based on our ability to directly address suicide risk, implement suicide prevention strategies, and develop a safety plan while the patient is in the clinical setting. Please contact our team if there is a concern that risk level has changed.                                                                  CSSR Risk Category:Low Protective factors which include lack of active SI/HI, no known access to weapons or firearms, motivation for treatment, no history of violence, sense of responsibility to family and social supports, presence of an available support system, expresses purpose for living, and effective problem-solving skills.    Suicide Risk Assessment: Patient has following modifiable risk factors for suicide: untreated depression, medication noncompliance, and triggering events, which we are addressing by recommending outpatient psychiatry and therapy. Patient has following non-modifiable or demographic risk factors for suicide: psychiatric hospitalization Patient has the following protective factors against suicide: Supportive family, and child in the home  Thank you for this consult request. Recommendations have been communicated to the primary team.  We will sign off at this time.   Teresa Wyline CROME, NP       History of Present Illness  Relevant Aspects of Hospital ED Course:  Admitted on 01/10/2024 for Suicidal ideation, depression, anxiety, and psychosocial stressors in the context of acute medical complaints.  Patient Report:  On approach, patient reports feeling a lot better and states that she is ready to go home. When asked what has changed, she states because she knows God is on her side. She she states that she has support and that she has a home health aide that comes to her house. She states that although she gets overwhelmed by having to take care of her husband and his alcohol use, she really does love him.  Today, she denies suicidal thoughts. She verbally contracts for safety. She denies homicidal thoughts. She reports decreased depression and rates her depression 6 out of 10 with 10 being the worst. She endorses depressive  symptoms of decreased energy, decreased motivation, sadness, poor sleep, poor appetite, and disappointment. She denies auditory or visual hallucinations. She denies paranoia. She reports decreased anxiety today, and rates her anxiety 4 out of 10 with 10 being the worst. She endorses worrying about her health. She was restarted back on her Wellbutrin  for depression and anxiety and has tolerated the medication overnight without any noticeable or reported side effects. Her urine drug  screen was positive for cocaine on arrival. She reports only using cocaine twice, once in November and once in December (2025). She reports only drinking alcohol twice when she used cocaine. BAL on arrival was negative. She denies alcohol withdrawal symptoms. She is not interested in substance abuse treatment. She would like to follow up with outpatient psychiatry and states that her PCP office at [address] offers psychiatry. Patient encouraged to schedule a new patient appointment with [facility] for medication management and therapy. Will also provided additional resources for outpatient psychiatry and therapy. She plans to return home today with her husband and son. She denies access to firearms. Safety planning completed at the time of the evaluation.   Psych ROS:  Psychiatric Symptoms Depression: 6/10, w/10 being worst.  Anxiety: 4/10, w/10 being worst Psychosis: No Mania: No  Review of Systems  Psychiatric/Behavioral:  Positive for depression. The patient is nervous/anxious.      Psychiatric and Social History  Psychiatric History:  Information collected from patient and patient's chart  Prev Dx/Sx: Depression, anxiety, PTSD Current Psych Provider: No Home Meds (current): Yes, noncompliant w/taking prescribed psychotropic medications  Previous Med Trials: Yes Therapy:  No  Prior Psych Hospitalization: Yes Prior Self Harm:  No Prior Violence:  No  Family Psych History:  None Family Hx suicide: No  Social History:  Educational Hx: Did not graduate high school Occupational Hx: Unemployed, receives disability Legal Hx:  No Living Situation: Lives at home with husband and son Spiritual Hx: Yes Access to weapons/lethal means:  No  Substance History Alcohol: Yes Type of alcohol:reports only drinking twice, this month and last month Last Drink yesterday Number of drinks per day: varies History of alcohol withdrawal seizures:No    History of DT's: No Tobacco: No Illicit drugs: Cocaine x 2 (November and December), appears to minimize use.  Prescription drug abuse:  No Rehab hx: No  Exam Findings  Physical Exam:  Vital Signs:  Temp:  [98.7 F (37.1 C)-98.8 F (37.1 C)] 98.8 F (37.1 C) (12/17 0612) Pulse Rate:  [71-80] 71 (12/17 0612) Resp:  [16-18] 18 (12/17 0612) BP: (122-137)/(64-81) 133/69 (12/17 0612) SpO2:  [94 %-100 %] 100 % (12/17 0612) Blood pressure 133/69, pulse 71, temperature 98.8 F (37.1 C), temperature source Oral, resp. rate 18, height 5' 5 (1.651 m), weight 121.6 kg, last menstrual period 05/29/2017, SpO2 100%. Body mass index is 44.61 kg/m.  Physical Exam Neurological:     Mental Status: She is alert and oriented to person, place, and time.  Psychiatric:        Behavior: Behavior normal. Behavior is cooperative.      Mental Status Exam: General Appearance: Obese female, appears stated age, on supplemental oxygen  Orientation:  Full (Time, Place, and Person)  Memory:  Immediate;   Fair Remote;   Fair  Concentration:  Concentration: Fair and Attention Span: Fair  Recall:  Fair  Attention  Fair  Eye Contact:  Fair  Speech:  Clear and Coherent  Language:  Fair  Volume:  Normal  Mood: Depressed, anxious  Affect:  Constricted, congruent  Thought Process:  Linear   Thought Content:  logical   Suicidal Thoughts:  No  Homicidal Thoughts:  No  Judgement:  Fair  Insight:  Fair  Psychomotor Activity:  Normal  Akathisia:  No  Fund of Knowledge:  Fair      Assets:  Manufacturing Systems Engineer Desire for Improvement Financial Resources/Insurance Housing Social Support  Cognition:  WNL  ADL's:  WNL  AIMS (if indicated):   N/A     Other History   These have been pulled in through the EMR, reviewed, and updated if appropriate.  Family History:  The patient's family history includes Alcohol abuse in her mother; Anxiety disorder in her father and mother; Depression in her mother;  Drug abuse in her father and mother; Heart disease in her maternal grandmother; Stroke in her mother.  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   Chronic diastolic CHF (congestive heart failure) 07/29/2021   Class 3 obesity with alveolar hypoventilation, serious comorbidity, and body mass index (BMI) of 50.Denise [name] to 59.9 in adult 06/12/2020   CVA (cerebrovascular accident) 2017   2017 MRI - chronic left lenticulostriate territory hemorrhage or hemorrhagic infarct, with hemosiderin, and encephalomalacia affecting both the caudate and lentiform nucleus     Decreased mobility 06/15/2021   DOE (dyspnea on exertion) 06/12/2020   Essential hypertension 12/18/2014   Fibroid uterus 12/18/2014   Heart murmur, systolic 06/29/2021   History of alcohol abuse 02/16/2017   History of cocaine use 02/16/2017   History of latent syphilis 02/16/2017   Hyperlipidemia associated with type 2 diabetes mellitus 11/07/2020   Incontinence of urine    Lacunar infarction    MRI - chronic lacunar infarct within the posterior right lentiform nucleus.     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   Right sided weakness    Social anxiety disorder 11/03/2017   Syncope 02/07/2015   TIA (transient ischemic attack)    Tobacco use  04/14/2015   Type 2 diabetes mellitus 10/21/2017   Venous stasis dermatitis of both lower extremities 06/15/2021    Surgical History: Past Surgical History:  Procedure Laterality Date   CESAREAN SECTION     x 2     Medications:  Current Medications[1]  Allergies: Allergies[2]  Franciszek Platten L, NP       [1]  Current Facility-Administered Medications:    albuterol  (VENTOLIN  HFA) 108 (90 Base) MCG/ACT inhaler 2 puff, 2 puff, Inhalation, Q6H PRN, Doretha, Whitney, MD   amLODipine  (NORVASC ) tablet 10 mg, 10 mg, Oral, Daily, Plunkett, Whitney, MD, 10 mg at 01/11/24 9060   atorvastatin  (LIPITOR) tablet 20 mg, 20 mg, Oral, Daily, Plunkett, Whitney, MD, 20 mg at 01/11/24 0940   budesonide -glycopyrrolate -formoterol  (BREZTRI ) 160-9-4.8 MCG/ACT  inhaler 2 puff, 2 puff, Inhalation, BID, Plunkett, Whitney, MD, 2 puff at 01/11/24 0951   buPROPion  (WELLBUTRIN  SR) 12 hr tablet 150 mg, 150 mg, Oral, BID, Plunkett, Whitney, MD, 150 mg at 01/11/24 0942   carbamazepine  (TEGRETOL ) tablet 400 mg, 400 mg, Oral, BID, Plunkett, Whitney, MD, 400 mg at 01/11/24 9057   furosemide  (LASIX ) tablet 80 mg, 80 mg, Oral, Daily, Plunkett, Whitney, MD, 80 mg at 01/11/24 9060   gabapentin  (NEURONTIN ) capsule 600 mg, 600 mg, Oral, TID, Doretha Folks, MD, 600 mg at 01/11/24 0940   guaiFENesin  (MUCINEX ) 12 hr tablet 1,200 mg, 1,200 mg, Oral, BID, Plunkett, Whitney, MD, 1,200 mg at 01/11/24 0941   hydrOXYzine  (ATARAX ) tablet 25 mg, 25 mg, Oral, TID PRN, Motley-Mangrum, Jadeka A, PMHNP   latanoprost  (XALATAN ) Denise [name].005 % ophthalmic solution 1 drop, 1 drop, Both Eyes, QHS, Plunkett, Whitney, MD, 1 drop at 01/10/24 2222   lisinopril  (ZESTRIL ) tablet 30 mg, 30 mg, Oral, Daily, Plunkett, Whitney, MD, 30 mg at 01/11/24 9057   metFORMIN  (GLUCOPHAGE ) tablet 500 mg, 500 mg, Oral, BID, Plunkett, Whitney, MD, 500 mg at 01/10/24 2133   olopatadine  (PATANOL) Denise [name].1 % ophthalmic solution 1 drop, 1 drop, Both Eyes, BID, Plunkett,  Whitney, MD, 1 drop at 01/10/24 2222   oxyCODONE -acetaminophen  (PERCOCET/ROXICET) 5-325 MG per tablet 1 tablet, 1 tablet, Oral, Q8H PRN, Doretha Folks, MD, 1 tablet at 01/10/24 1239   pantoprazole  (PROTONIX ) EC tablet 40 mg, 40 mg, Oral, Daily, Plunkett, Whitney, MD, 40 mg at 01/11/24 9060   potassium chloride  SA (KLOR-CON  M) CR tablet 20 mEq, 20 mEq, Oral, Daily, Plunkett, Whitney, MD, 20 mEq at 01/11/24 9061  Current Outpatient Medications:    amLODipine  (NORVASC ) 10 MG tablet, Take 10 mg by mouth daily., Disp: , Rfl:    BAYER BACK & BODY PAIN EX ST 500-32.5 MG TABS, Take 1-2 tablets by mouth every 6 (six) hours as needed (for pain)., Disp: , Rfl:    budesonide -glycopyrrolate -formoterol  (BREZTRI  AEROSPHERE) 160-9-4.8 MCG/ACT AERO inhaler, Inhale 2 puffs into the lungs in the morning and at bedtime., Disp: 10.7 g, Rfl: 10   carbamazepine  (TEGRETOL ) 200 MG tablet, Take 2 tablets (400 mg total) by mouth 2 (two) times daily., Disp: 360 tablet, Rfl: 1   diphenhydrAMINE -PE-APAP (MUCINEX  FAST-MAX NGHT COLD/FLU) 12.5-5-325 MG TABS, Take 1 tablet by mouth every 6 (six) hours as needed (for flu-like symptoms)., Disp: , Rfl:    furosemide  (LASIX ) 80 MG tablet, TAKE 1 TABLET BY MOUTH EVERY DAY, Disp: 15 tablet, Rfl: Denise [name]   gabapentin  (NEURONTIN ) 300 MG capsule, Take 2 capsules (600 mg total) by mouth 3 (three) times daily., Disp: 180 capsule, Rfl: 6   latanoprost  (XALATAN ) Denise [name].005 % ophthalmic solution, Place 1 drop into both eyes at bedtime., Disp: , Rfl:    lisinopril  (ZESTRIL ) 30 MG tablet, Take 30 mg by mouth daily., Disp: , Rfl:    metFORMIN  (GLUCOPHAGE ) 500 MG tablet, Take 500 mg by mouth See admin instructions. Take 500 mg by mouth with breakfast and supper, Disp: , Rfl: 3   nicotine  (NICODERM CQ  - DOSED IN MG/24 HOURS) 14 mg/24hr patch, RX #2 Weeks 5-6: 14 mg x 1 patch daily. Wear for 24 hours. If you have sleep disturbances, remove at bedtime., Disp: 14 patch, Rfl: Denise [name]   Nicotine  (NICODERM CQ  TD), Place  1 patch onto the skin daily as needed (for smoking cessation)., Disp: , Rfl:    OXYGEN, Inhale 2 L/min into the lungs continuous., Disp: , Rfl:    Polyethyl Glycol-Propyl  Glycol (SYSTANE ULTRA) Denise [name].4-Denise [name].3 % SOLN, Apply 1 drop to eye., Disp: , Rfl:    potassium chloride  SA (KLOR-CON  M) 20 MEQ tablet, Take 1 tablet (20 mEq total) by mouth daily., Disp: 30 tablet, Rfl: 1   PRESCRIPTION MEDICATION, CPAP - At bedtime and during any time of rest, Disp: , Rfl:    VENTOLIN  HFA 108 (90 Base) MCG/ACT inhaler, inhale 2 PUFFS into THE lungs EVERY 6 HOURS AS NEEDED FOR WHEEZING OR SHORTNESS OF BREATH, Disp: 18 g, Rfl: 11   atorvastatin  (LIPITOR) 20 MG tablet, Take 1 tablet (20 mg total) by mouth daily. Pt needs to keep upcoming appt in July for further refills (Patient not taking: Reported on 01/10/2024), Disp: 30 tablet, Rfl: Denise [name]   buPROPion  (WELLBUTRIN  SR) 150 MG 12 hr tablet, Take 1 tablet (150 mg total) by mouth 2 (two) times daily., Disp: 28 tablet, Rfl: Denise [name]   guaiFENesin  (MUCINEX ) 600 MG 12 hr tablet, Take 2 tablets (1,200 mg total) by mouth 2 (two) times daily. (Patient not taking: Reported on 01/10/2024), Disp: 30 tablet, Rfl: Denise [name]   Olopatadine  HCl Denise [name].2 % SOLN, Place 1 drop into both eyes daily., Disp: , Rfl:    pantoprazole  (PROTONIX ) 40 MG tablet, Take 1 tablet (40 mg total) by mouth daily., Disp: 30 tablet, Rfl: Denise [name]   senna-docusate (SENOKOT-S) 8.6-50 MG tablet, Take 1 tablet by mouth at bedtime as needed for mild constipation. (Patient not taking: Reported on 01/10/2024), Disp: , Rfl:  [2]  Allergies Allergen Reactions   Gadolinium Derivatives Nausea And Vomiting and Other (See Comments)    Nausea and vomiting was followed by epileptic seizure episode that lasted approximately 5 minutes; pt was unable to verbally communicate during that time; she then came to and was able to speak; was evaluated by Rad and RN; kms    Grapefruit Flavor [Flavoring Agent (Non-Screening)] Other (See Comments)    Drug interaction

## 2024-01-11 NOTE — Telephone Encounter (Addendum)
 Informed patient's husband of the schedule change from 01/23/24 to 01/24/24 for the bronchoscopy

## 2024-01-11 NOTE — ED Provider Notes (Signed)
 Emergency Medicine Observation Re-evaluation Note  Denise [name] is a 53 y.o. female, seen on rounds today.  Pt initially presented to the ED for complaints of Shortness of Breath Currently, the patient is resting quietly..  Patient reports her breathing feels comfortable at this time.  She is awaiting reassessment by psychiatry for suicidal thoughts.  Physical Exam  BP 133/69 (BP Location: Right Arm)   Pulse 71   Temp 98.8 F (37.1 C) (Oral)   Resp 18   Ht 5' 5 (1.651 m)   Wt 121.6 kg   LMP 05/29/2017   SpO2 100%   BMI 44.61 kg/m  Physical Exam General: Sleeping quietly awakens easily. Cardiac: Regular. Lungs: Clear to auscultation. Psych: Calm and directable.  ED Course / MDM  EKG:EKG Interpretation Date/Time:  Tuesday January 10 2024 09:45:06 EST Ventricular Rate:  84 PR Interval:  156 QRS Duration:  87 QT Interval:  414 QTC Calculation: 490 R Axis:   33  Text Interpretation: Sinus rhythm Low voltage, precordial leads Nonspecific T abnormalities, anterior leads unchanged from prior new Borderline prolonged QT interval Confirmed by Doretha Folks ([id number]) on 01/10/2024 11:27:15 AM  I have reviewed the labs performed to date as well as medications administered while in observation.  Recent changes in the last 24 hours include none..  Plan  Current plan is for repeat evaluation by psychiatry for suicidal ideation.[name]    Armenta Canning, MD 01/11/24 0800

## 2024-01-12 NOTE — Telephone Encounter (Signed)
 ATC X1. LMTCB

## 2024-01-18 ENCOUNTER — Other Ambulatory Visit: Payer: Self-pay

## 2024-01-18 ENCOUNTER — Encounter (HOSPITAL_COMMUNITY): Payer: Self-pay

## 2024-01-18 NOTE — Progress Notes (Signed)
 SDW call  Patient was given pre-op instructions over the phone. Patient verbalized understanding of instructions provided. States always a little SOB, wears home oxygen 2L/[place].  Denies fever or cough     PCP - Annabella Rigg, FNP Cardiologist - Dr. Stanly Leavens (per pt scheduled 02/09/2024) Pulmonary:  Neurology: Dr. Juliene Dunnings   PPM/ICD - denies Device Orders - na Rep Notified - na   Chest x-ray - 01/10/2024 EKG -  01/10/2024 Stress Test -07/22/2020 ECHO - 01/18/2023 Cardiac Cath -  PFT: 12/08/2022  Sleep Study/sleep apnea/CPAP: OSA with CPAP, wears oxygen 2L/[place]  Type II diabetic.  A1C 5.9 on 12/29/2023. Does not check blood sugar due to not having a unit. Encouraged pt to contact PCP for prescription for machine Fasting Blood sugar range: na How often check sugars:na Metformin , hold DOS   Blood Thinner Instructions: denies  Aspirin  Instructions:denies   ERAS Protcol - NPO   Anesthesia review: Yes. Seen in ED 12/16, said she was dx with pneumonia 12/14; DM, stroke, CHF, HTN, MI, OSA with CPAP  Your procedure is scheduled on Tuesday January 24, 2024  Report to [facility] Main Entrance A at  0530  A.M., then check in with the Admitting office.  Call this number if you have problems the morning of surgery:  [phone]   If you have any questions prior to your surgery date call [phone]: Open Monday-Friday 8am-4pm If you experience any cold or flu symptoms such as cough, fever, chills, shortness of breath, etc. between now and your scheduled surgery, please notify us  at the above number    Remember:  Do not eat or drink after midnight the night before your surgery  Take these medicines the morning of surgery with A SIP OF WATER:  Amlodipine , breztri , wellbutirn, tegretol , gabapentin , eye drops, protonix   As needed: ventolin   As of today, STOP taking any Aspirin  (unless otherwise instructed by your surgeon) Aleve, Naproxen, Ibuprofen , Motrin , Advil ,  Goody's, BC's, all herbal medications, fish oil, and all vitamins.

## 2024-01-24 ENCOUNTER — Encounter (HOSPITAL_COMMUNITY): Payer: Self-pay | Admitting: Physician Assistant

## 2024-01-24 ENCOUNTER — Ambulatory Visit (HOSPITAL_COMMUNITY): Admission: RE | Admit: 2024-01-24

## 2024-01-24 NOTE — Progress Notes (Signed)
 Pt did not show up for procedure. Short stay called pt this am and she stated she was not coming due to not having transport.  Clotilda Schmitz, RN

## 2024-01-24 NOTE — Progress Notes (Signed)
 Short stay secretary reached out to patient at 0545 regarding arrival to procedure. Pt stated that she would not be coming in due to transportation issues. Pt told to contact Dr. Dirk office about rescheduling procedure. Pt verbalized understanding.   Joesph LOISE Stake, RN

## 2024-01-24 NOTE — Anesthesia Preprocedure Evaluation (Addendum)
 "                                  Anesthesia Evaluation    Airway        Dental   Pulmonary sleep apnea and Continuous Positive Airway Pressure Ventilation , COPD, Current Smoker          Cardiovascular hypertension, + Past MI, +CHF and + DOE       Neuro/Psych   Anxiety Depression    TIACVA    GI/Hepatic negative GI ROS, Neg liver ROS,,,  Endo/Other  diabetes, Type 2    Renal/GU negative Renal ROS  negative genitourinary   Musculoskeletal  (+) Arthritis , Osteoarthritis,    Abdominal   Peds  Hematology Lab Results      Component                Value               Date                      WBC                      9.7                 01/10/2024                HGB                      13.9                01/10/2024                HCT                      41.3                01/10/2024                MCV                      89.0                01/10/2024                PLT                      374                 01/10/2024             Lab Results      Component                Value               Date                      NA                       139                 01/10/2024                K  3.9                 01/10/2024                CO2                      26                  01/10/2024                GLUCOSE                  103 (H)             01/10/2024                BUN                      7                   01/10/2024                CREATININE               0.59                01/10/2024                CALCIUM                   9.7                 01/10/2024                GFR                      81.48               06/24/2022                EGFR                     98                  06/01/2023                GFRNONAA                 >60                 01/10/2024              Anesthesia Other Findings   Reproductive/Obstetrics                              Anesthesia Physical Anesthesia  Plan  ASA: 3  Anesthesia Plan: General   Post-op Pain Management:    Induction: Intravenous  PONV Risk Score and Plan: Ondansetron , Midazolam, Treatment may vary due to age or medical condition and Dexamethasone  Airway Management Planned: Mask and Oral ETT  Additional Equipment: None  Intra-op Plan:   Post-operative Plan: Extubation in OR  Informed Consent:   Plan Discussed with:   Anesthesia Plan Comments:          Anesthesia Quick Evaluation  "

## 2024-01-30 ENCOUNTER — Telehealth: Payer: Self-pay | Admitting: Acute Care

## 2024-01-30 ENCOUNTER — Inpatient Hospital Stay (HOSPITAL_COMMUNITY)
Admission: EM | Admit: 2024-01-30 | Discharge: 2024-02-03 | DRG: 166 | Disposition: A | Discharge status: Home / Self Care | Attending: Internal Medicine | Admitting: Internal Medicine

## 2024-01-30 ENCOUNTER — Emergency Department (HOSPITAL_COMMUNITY)

## 2024-01-30 ENCOUNTER — Other Ambulatory Visit: Payer: Self-pay

## 2024-01-30 ENCOUNTER — Encounter (HOSPITAL_COMMUNITY): Payer: Self-pay

## 2024-01-30 DIAGNOSIS — F17210 Nicotine dependence, cigarettes, uncomplicated: Secondary | ICD-10-CM | POA: Diagnosis present

## 2024-01-30 DIAGNOSIS — Z5982 Transportation insecurity: Secondary | ICD-10-CM

## 2024-01-30 DIAGNOSIS — E872 Acidosis, unspecified: Secondary | ICD-10-CM | POA: Insufficient documentation

## 2024-01-30 DIAGNOSIS — I110 Hypertensive heart disease with heart failure: Secondary | ICD-10-CM | POA: Diagnosis present

## 2024-01-30 DIAGNOSIS — G40909 Epilepsy, unspecified, not intractable, without status epilepticus: Secondary | ICD-10-CM | POA: Diagnosis present

## 2024-01-30 DIAGNOSIS — C3492 Malignant neoplasm of unspecified part of left bronchus or lung: Secondary | ICD-10-CM | POA: Diagnosis present

## 2024-01-30 DIAGNOSIS — J159 Unspecified bacterial pneumonia: Secondary | ICD-10-CM | POA: Diagnosis present

## 2024-01-30 DIAGNOSIS — Z9981 Dependence on supplemental oxygen: Secondary | ICD-10-CM

## 2024-01-30 DIAGNOSIS — R4189 Other symptoms and signs involving cognitive functions and awareness: Secondary | ICD-10-CM | POA: Diagnosis present

## 2024-01-30 DIAGNOSIS — F1010 Alcohol abuse, uncomplicated: Secondary | ICD-10-CM | POA: Diagnosis present

## 2024-01-30 DIAGNOSIS — J439 Emphysema, unspecified: Secondary | ICD-10-CM | POA: Diagnosis present

## 2024-01-30 DIAGNOSIS — C3491 Malignant neoplasm of unspecified part of right bronchus or lung: Secondary | ICD-10-CM | POA: Diagnosis present

## 2024-01-30 DIAGNOSIS — E8721 Acute metabolic acidosis: Secondary | ICD-10-CM | POA: Diagnosis present

## 2024-01-30 DIAGNOSIS — I5032 Chronic diastolic (congestive) heart failure: Secondary | ICD-10-CM | POA: Diagnosis present

## 2024-01-30 DIAGNOSIS — J9621 Acute and chronic respiratory failure with hypoxia: Principal | ICD-10-CM | POA: Diagnosis present

## 2024-01-30 DIAGNOSIS — F32A Depression, unspecified: Secondary | ICD-10-CM | POA: Diagnosis present

## 2024-01-30 DIAGNOSIS — Z555 Less than a high school diploma: Secondary | ICD-10-CM

## 2024-01-30 DIAGNOSIS — I10 Essential (primary) hypertension: Secondary | ICD-10-CM | POA: Diagnosis present

## 2024-01-30 DIAGNOSIS — Z91041 Radiographic dye allergy status: Secondary | ICD-10-CM

## 2024-01-30 DIAGNOSIS — I872 Venous insufficiency (chronic) (peripheral): Secondary | ICD-10-CM | POA: Diagnosis present

## 2024-01-30 DIAGNOSIS — Z1152 Encounter for screening for COVID-19: Secondary | ICD-10-CM

## 2024-01-30 DIAGNOSIS — J9601 Acute respiratory failure with hypoxia: Secondary | ICD-10-CM

## 2024-01-30 DIAGNOSIS — E876 Hypokalemia: Secondary | ICD-10-CM | POA: Diagnosis present

## 2024-01-30 DIAGNOSIS — G4733 Obstructive sleep apnea (adult) (pediatric): Secondary | ICD-10-CM | POA: Diagnosis present

## 2024-01-30 DIAGNOSIS — Z7984 Long term (current) use of oral hypoglycemic drugs: Secondary | ICD-10-CM

## 2024-01-30 DIAGNOSIS — F1491 Cocaine use, unspecified, in remission: Secondary | ICD-10-CM | POA: Diagnosis present

## 2024-01-30 DIAGNOSIS — Z8673 Personal history of transient ischemic attack (TIA), and cerebral infarction without residual deficits: Secondary | ICD-10-CM

## 2024-01-30 DIAGNOSIS — Z9102 Food additives allergy status: Secondary | ICD-10-CM

## 2024-01-30 DIAGNOSIS — C771 Secondary and unspecified malignant neoplasm of intrathoracic lymph nodes: Secondary | ICD-10-CM | POA: Diagnosis present

## 2024-01-30 DIAGNOSIS — Z79899 Other long term (current) drug therapy: Secondary | ICD-10-CM

## 2024-01-30 DIAGNOSIS — C786 Secondary malignant neoplasm of retroperitoneum and peritoneum: Secondary | ICD-10-CM | POA: Diagnosis present

## 2024-01-30 DIAGNOSIS — Z8249 Family history of ischemic heart disease and other diseases of the circulatory system: Secondary | ICD-10-CM

## 2024-01-30 DIAGNOSIS — Z818 Family history of other mental and behavioral disorders: Secondary | ICD-10-CM

## 2024-01-30 DIAGNOSIS — R911 Solitary pulmonary nodule: Secondary | ICD-10-CM | POA: Insufficient documentation

## 2024-01-30 DIAGNOSIS — Z716 Tobacco abuse counseling: Secondary | ICD-10-CM

## 2024-01-30 DIAGNOSIS — Z6841 Body Mass Index (BMI) 40.0 and over, adult: Secondary | ICD-10-CM

## 2024-01-30 DIAGNOSIS — I639 Cerebral infarction, unspecified: Secondary | ICD-10-CM | POA: Diagnosis present

## 2024-01-30 DIAGNOSIS — E66813 Obesity, class 3: Secondary | ICD-10-CM | POA: Diagnosis present

## 2024-01-30 DIAGNOSIS — E119 Type 2 diabetes mellitus without complications: Secondary | ICD-10-CM

## 2024-01-30 DIAGNOSIS — I252 Old myocardial infarction: Secondary | ICD-10-CM

## 2024-01-30 DIAGNOSIS — J441 Chronic obstructive pulmonary disease with (acute) exacerbation: Principal | ICD-10-CM | POA: Diagnosis present

## 2024-01-30 DIAGNOSIS — K649 Unspecified hemorrhoids: Secondary | ICD-10-CM | POA: Diagnosis present

## 2024-01-30 DIAGNOSIS — R918 Other nonspecific abnormal finding of lung field: Secondary | ICD-10-CM

## 2024-01-30 LAB — HEPATIC FUNCTION PANEL
ALT: 11 U/L (ref 0–44)
AST: 17 U/L (ref 15–41)
Albumin: 4.3 g/dL (ref 3.5–5.0)
Alkaline Phosphatase: 148 U/L — ABNORMAL HIGH (ref 38–126)
Bilirubin, Direct: <0.1 mg/dL (ref 0.0–0.2)
Total Bilirubin: <0.2 mg/dL (ref 0.0–1.2)
Total Protein: 7 g/dL (ref 6.5–8.1)

## 2024-01-30 LAB — RESP PANEL BY RT-PCR (RSV, FLU A&B, COVID)  RVPGX2
Influenza A by PCR: NEGATIVE
Influenza B by PCR: NEGATIVE
Resp Syncytial Virus by PCR: NEGATIVE
SARS Coronavirus 2 by RT PCR: NEGATIVE

## 2024-01-30 LAB — CBC WITH DIFFERENTIAL/PLATELET
Abs Immature Granulocytes: 0.05 K/uL (ref 0.00–0.07)
Basophils Absolute: 0.1 K/uL (ref 0.0–0.1)
Basophils Relative: 0 %
Eosinophils Absolute: 0.2 K/uL (ref 0.0–0.5)
Eosinophils Relative: 2 %
HCT: 38.3 % (ref 36.0–46.0)
Hemoglobin: 13 g/dL (ref 12.0–15.0)
Immature Granulocytes: 0 %
Lymphocytes Relative: 35 %
Lymphs Abs: 4.2 K/uL — ABNORMAL HIGH (ref 0.7–4.0)
MCH: 30.1 pg (ref 26.0–34.0)
MCHC: 33.9 g/dL (ref 30.0–36.0)
MCV: 88.7 fL (ref 80.0–100.0)
Monocytes Absolute: 0.8 K/uL (ref 0.1–1.0)
Monocytes Relative: 7 %
Neutro Abs: 6.8 K/uL (ref 1.7–7.7)
Neutrophils Relative %: 56 %
Platelets: 342 K/uL (ref 150–400)
RBC: 4.32 MIL/uL (ref 3.87–5.11)
RDW: 13.7 % (ref 11.5–15.5)
WBC: 12 K/uL — ABNORMAL HIGH (ref 4.0–10.5)
nRBC: 0 % (ref 0.0–0.2)

## 2024-01-30 LAB — TYPE AND SCREEN
ABO/RH(D): A POS
Antibody Screen: NEGATIVE

## 2024-01-30 LAB — BLOOD GAS, VENOUS
Acid-base deficit: 3.9 mmol/L — ABNORMAL HIGH (ref 0.0–2.0)
Bicarbonate: 19.5 mmol/L — ABNORMAL LOW (ref 20.0–28.0)
Drawn by: 8571
O2 Saturation: 93.3 %
Patient temperature: 37
pCO2, Ven: 30 mmHg — ABNORMAL LOW (ref 44–60)
pH, Ven: 7.42 (ref 7.25–7.43)
pO2, Ven: 57 mmHg — ABNORMAL HIGH (ref 32–45)

## 2024-01-30 LAB — BASIC METABOLIC PANEL WITH GFR
Anion gap: 21 — ABNORMAL HIGH (ref 5–15)
BUN: 7 mg/dL (ref 6–20)
CO2: 18 mmol/L — ABNORMAL LOW (ref 22–32)
Calcium: 9.5 mg/dL (ref 8.9–10.3)
Chloride: 96 mmol/L — ABNORMAL LOW (ref 98–111)
Creatinine, Ser: 0.7 mg/dL (ref 0.44–1.00)
GFR, Estimated: >60 mL/min
Glucose, Bld: 103 mg/dL — ABNORMAL HIGH (ref 70–99)
Potassium: 3.4 mmol/L — ABNORMAL LOW (ref 3.5–5.1)
Sodium: 135 mmol/L (ref 135–145)

## 2024-01-30 LAB — PRO BRAIN NATRIURETIC PEPTIDE: Pro Brain Natriuretic Peptide: <50 pg/mL

## 2024-01-30 LAB — MAGNESIUM: Magnesium: 1.6 mg/dL — ABNORMAL LOW (ref 1.7–2.4)

## 2024-01-30 MED ORDER — ALBUTEROL SULFATE (2.5 MG/3ML) 0.083% IN NEBU
2.5000 mg | INHALATION_SOLUTION | Freq: Once | RESPIRATORY_TRACT | Status: AC
  Administered 2024-01-30: 2.5 mg via RESPIRATORY_TRACT
  Filled 2024-01-30: qty 3

## 2024-01-30 MED ORDER — MAGNESIUM SULFATE 2 GM/50ML IV SOLN
2.0000 g | Freq: Once | INTRAVENOUS | Status: AC
  Administered 2024-01-30: 2 g via INTRAVENOUS
  Filled 2024-01-30: qty 50

## 2024-01-30 NOTE — ED Triage Notes (Signed)
 BIBA from home, initial c/o rectal bleeding.  EMS arrived and she c/o copd exasperation all day with wheezing in all fields.  Pt reports been on cpap all day but ran out of treatments. EMS Initial assessment was 89% RA.  Given 2 duo neb treatments and 125mg  solumedrol.  After treatment and 3L O2 she was at 100% O2  150/80 Hr 80 100% O2 3L

## 2024-01-30 NOTE — ED Notes (Signed)
Purewick has been placed.  

## 2024-01-30 NOTE — Telephone Encounter (Signed)
 This  a bronch follow up but looks like she cancelled her bronch. Can we call her and cancel her appointment tomorrow at 9:30 am? Thanks so much

## 2024-01-30 NOTE — Telephone Encounter (Signed)
 She presented to the ED at 8:30 tonight. No notes yet. We can look at them in the morning.

## 2024-01-30 NOTE — Progress Notes (Signed)
" °   01/30/24 2040  BiPAP/CPAP/SIPAP  $ Non-Invasive Ventilator  Non-Invasive Vent Initial  $ Face Mask Medium Yes  BiPAP/CPAP/SIPAP Pt Type Adult  BiPAP/CPAP/SIPAP V60  Mask Type Full face mask  Dentures removed? Not applicable  Mask Size Medium  Set Rate 15 breaths/min  Respiratory Rate 16 breaths/min  IPAP 10 cmH20  EPAP 5 cmH2O  FiO2 (%) 30 %  Minute Ventilation 10.9  Leak 0  Peak Inspiratory Pressure (PIP) 10  Tidal Volume (Vt) 511  Patient Home Machine No  Patient Home Mask No  Patient Home Tubing No  Auto Titrate No  Press High Alarm 30 cmH2O  Press Low Alarm 5 cmH2O  CPAP/SIPAP surface wiped down Yes  Device Plugged into RED Power Outlet Yes  BiPAP/CPAP /SiPAP Vitals  Pulse Rate 78  Resp 16  SpO2 100 %  Bilateral Breath Sounds Diminished  MEWS Score/Color  MEWS Score 0  MEWS Score Color Green    "

## 2024-01-30 NOTE — Telephone Encounter (Signed)
 This is her third time cancel a bronch,last time prior to her no showing her appointment we wanted to get her in office to talk about why she keeps canceling her procedures and either schedule another bronch or look into other options due to her consistently canceling

## 2024-01-30 NOTE — ED Provider Notes (Signed)
 " [place] EMERGENCY DEPARTMENT AT [facility] Provider Note   CSN: [id number] Arrival date & time: 01/30/24  2036     Patient presents with: Shortness of Breath   Denise [name] is a 54 y.o. female.   HPI Patient presents for respiratory distress.  Medical history includes polysubstance abuse, HTN, DM, HLD, obesity, depression, CVA, COPD.  For the past 3 days, she has had painful rectal bleeding.  She attributes this to known hemorrhoids.  She has been using Preparation H without relief.  She does describe large-volume blood loss and has been having lightheadedness.  Throughout the day today, she has been short of breath.  When EMS arrived on scene, patient was in respiratory distress.  SpO2 was 89% on her home CPAP machine.  They noted tripoding, difficulty speaking due to breathlessness, and wheezing throughout all lung fields.  She reportedly has ran out of her home breathing treatment medications.  With EMS, she did receive 2X DuoNebs, 125 mg of Solu-Medrol , and was placed on CPAP.  Her respiratory symptoms improved during transit.    Prior to Admission medications  Medication Sig Start Date End Date Taking? Authorizing Provider  amLODipine  (NORVASC ) 10 MG tablet Take 10 mg by mouth daily. 05/25/23   [provider]  atorvastatin  (LIPITOR) 20 MG tablet Take 1 tablet (20 mg total) by mouth daily. Pt needs to keep upcoming appt in July for further refills Patient not taking: Reported on 01/10/2024 07/20/23   Santo Stanly LABOR, MD  BAYER BACK & BODY PAIN EX ST 500-32.5 MG TABS Take 1-2 tablets by mouth every 6 (six) hours as needed (for pain).    [provider]  budesonide -glycopyrrolate -formoterol  (BREZTRI  AEROSPHERE) 160-9-4.8 MCG/ACT AERO inhaler Inhale 2 puffs into the lungs in the morning and at bedtime. 10/11/23   Cobb, Comer GAILS, NP  buPROPion  (WELLBUTRIN  SR) 150 MG 12 hr tablet Take 1 tablet (150 mg total) by mouth 2 (two) times daily.  01/11/24   White, Patrice L, NP  carbamazepine  (TEGRETOL ) 200 MG tablet Take 2 tablets (400 mg total) by mouth 2 (two) times daily. 12/15/23   Skeet Juliene SAUNDERS, DO  diphenhydrAMINE -PE-APAP (MUCINEX  FAST-MAX NGHT COLD/FLU) 12.5-5-325 MG TABS Take 1 tablet by mouth every 6 (six) hours as needed (for flu-like symptoms).    [provider]  furosemide  (LASIX ) 80 MG tablet TAKE 1 TABLET BY MOUTH EVERY DAY 11/10/23   Chandrasekhar, Mahesh A, MD  gabapentin  (NEURONTIN ) 300 MG capsule Take 2 capsules (600 mg total) by mouth 3 (three) times daily. 12/15/23   Skeet Juliene SAUNDERS, DO  guaiFENesin  (MUCINEX ) 600 MG 12 hr tablet Take 2 tablets (1,200 mg total) by mouth 2 (two) times daily. Patient not taking: Reported on 01/10/2024 12/02/23   Regalado, Owen A, MD  latanoprost  (XALATAN ) 0.005 % ophthalmic solution Place 1 drop into both eyes at bedtime. 09/19/23 09/18/24  [provider]  lisinopril  (ZESTRIL ) 30 MG tablet Take 30 mg by mouth daily. 03/29/23   [provider]  metFORMIN  (GLUCOPHAGE ) 500 MG tablet Take 500 mg by mouth See admin instructions. Take 500 mg by mouth with breakfast and supper 05/25/17   [provider]  nicotine  (NICODERM CQ  - DOSED IN MG/24 HOURS) 14 mg/24hr patch RX #2 Weeks 5-6: 14 mg x 1 patch daily. Wear for 24 hours. If you have sleep disturbances, remove at bedtime. 12/02/23   Regalado, Belkys A, MD  Nicotine  (NICODERM CQ  TD) Place 1 patch onto the skin daily as needed (for  smoking cessation).    [provider]  Olopatadine  HCl 0.2 % SOLN Place 1 drop into both eyes daily. 09/19/23   [provider]  OXYGEN Inhale 2 L/min into the lungs continuous.    [provider]  pantoprazole  (PROTONIX ) 40 MG tablet Take 1 tablet (40 mg total) by mouth daily. 12/02/23 01/10/24  Regalado, Belkys A, MD  Polyethyl Glycol-Propyl Glycol (SYSTANE ULTRA) 0.4-0.3 % SOLN Apply 1 drop to eye. 09/19/23   [provider]  potassium chloride  SA  (KLOR-CON  M) 20 MEQ tablet Take 1 tablet (20 mEq total) by mouth daily. 08/01/21   Jillian Buttery, MD  PRESCRIPTION MEDICATION CPAP - At bedtime and during any time of rest    [provider]  senna-docusate (SENOKOT-S) 8.6-50 MG tablet Take 1 tablet by mouth at bedtime as needed for mild constipation. Patient not taking: Reported on 01/10/2024 02/20/23   Cherlyn Labella, MD  VENTOLIN  HFA 108 (90 Base) MCG/ACT inhaler inhale 2 PUFFS into THE lungs EVERY 6 HOURS AS NEEDED FOR WHEEZING OR SHORTNESS OF BREATH 02/15/22   Sood, Vineet, MD    Allergies: Gadolinium derivatives and Grapefruit flavor [flavoring agent (non-screening)]    Review of Systems  Constitutional:  Positive for fatigue.  Respiratory:  Positive for chest tightness, shortness of breath and wheezing.   Gastrointestinal:  Positive for anal bleeding and rectal pain.  Neurological:  Positive for light-headedness.  All other systems reviewed and are negative.   Updated Vital Signs BP (!) 133/95   Pulse 74   Temp 99.2 F (37.3 C) (Axillary)   Resp (!) 28   LMP 05/29/2017   SpO2 100%   Physical Exam Vitals and nursing note reviewed.  Constitutional:      Appearance: She is well-developed. She is obese. She is ill-appearing. She is not toxic-appearing or diaphoretic.  HENT:     Head: Normocephalic and atraumatic.     Right Ear: External ear normal.     Left Ear: External ear normal.     Nose: Nose normal.     Mouth/Throat:     Mouth: Mucous membranes are moist.  Eyes:     Extraocular Movements: Extraocular movements intact.     Conjunctiva/sclera: Conjunctivae normal.  Cardiovascular:     Rate and Rhythm: Regular rhythm.  Pulmonary:     Effort: Tachypnea and respiratory distress present.     Breath sounds: Decreased air movement present. Decreased breath sounds and wheezing present.  Abdominal:     General: There is no distension.     Palpations: Abdomen is soft.     Tenderness: There is no abdominal  tenderness.  Musculoskeletal:        General: No swelling or deformity.     Cervical back: Normal range of motion and neck supple.  Skin:    General: Skin is warm and dry.     Coloration: Skin is not jaundiced or pale.  Neurological:     General: No focal deficit present.     Mental Status: She is alert and oriented to person, place, and time.  Psychiatric:        Mood and Affect: Mood normal.        Behavior: Behavior normal.     (all labs ordered are listed, but only abnormal results are displayed) Labs Reviewed  BLOOD GAS, VENOUS - Abnormal; Notable for the following components:      Result Value   pCO2, Ven 30 (*)    pO2, Ven 57 (*)  Bicarbonate 19.5 (*)    Acid-base deficit 3.9 (*)    All other components within normal limits  BASIC METABOLIC PANEL WITH GFR - Abnormal; Notable for the following components:   Potassium 3.4 (*)    Chloride 96 (*)    CO2 18 (*)    Glucose, Bld 103 (*)    Anion gap 21 (*)    All other components within normal limits  HEPATIC FUNCTION PANEL - Abnormal; Notable for the following components:   Alkaline Phosphatase 148 (*)    All other components within normal limits  MAGNESIUM  - Abnormal; Notable for the following components:   Magnesium  1.6 (*)    All other components within normal limits  CBC WITH DIFFERENTIAL/PLATELET - Abnormal; Notable for the following components:   WBC 12.0 (*)    Lymphs Abs 4.2 (*)    All other components within normal limits  RESP PANEL BY RT-PCR (RSV, FLU A&B, COVID)  RVPGX2  PRO BRAIN NATRIURETIC PEPTIDE  HEMOGLOBIN A1C  COMPREHENSIVE METABOLIC PANEL WITH GFR  CBC  CBC  POC OCCULT BLOOD, ED  TYPE AND SCREEN  TROPONIN T, HIGH SENSITIVITY    EKG: EKG Interpretation Date/Time:  Monday January 30 2024 20:44:38 EST Ventricular Rate:  79 PR Interval:  154 QRS Duration:  92 QT Interval:  426 QTC Calculation: 489 R Axis:   43  Text Interpretation: Sinus rhythm Borderline T abnormalities, inferior  leads Borderline prolonged QT interval Confirmed by [name] Motto [id number]) on 01/30/2024 8:54:30 PM  Radiology: [facility] Chest Port 1 View Result Date: 01/30/2024 EXAM: 1 VIEW(S) XRAY OF THE CHEST 01/30/2024 09:23:00 PM COMPARISON: Portable chest 01/10/2024. CTA chest 11/30/2023. CLINICAL HISTORY: dyspnea FINDINGS: LINES, TUBES AND DEVICES: Overlying monitor leads. LUNGS AND PLEURA: There are numerous pulmonary nodules throughout the lung fields. Findings consistent with widespread pulmonary metastases. This was better seen on CT as well as an approximately 3 cm left upper lobe presumed primary malignancy superimposing adjacent the aortic knob. There is soft tissue fullness along both hilar silhouettes and AP window, consistent with adenopathy. This was seen previously and may have slightly progressed. No focal pneumonia is evident. No pleural effusion. No pneumothorax. HEART AND MEDIASTINUM: Cardiomegaly is stable. No vascular congestion is seen. There is a mildly tortuous aorta with atherosclerosis. BONES AND SOFT TISSUES: No acute osseous abnormality. IMPRESSION: 1. Stable cardiomegaly without vascular congestion or focal pneumonia. 2. Widespread pulmonary metastases with approximately 3 cm left upper lobe presumed primary malignancy partially visible, and hilar/AP window adenopathy possibly slightly progressed. Electronically signed by: Francis Quam MD 01/30/2024 09:34 PM EST RP Workstation: [id number]     Procedures   Medications Ordered in the ED  amLODipine  (NORVASC ) tablet 10 mg (has no administration in time range)  furosemide  (LASIX ) tablet 80 mg (has no administration in time range)  lisinopril  (ZESTRIL ) tablet 30 mg (has no administration in time range)  carbamazepine  (TEGRETOL ) tablet 400 mg (has no administration in time range)  gabapentin  (NEURONTIN ) capsule 600 mg (has no administration in time range)  potassium chloride  SA (KLOR-CON  M) CR tablet 20 mEq (has no administration in time range)   insulin  aspart (novoLOG ) injection 0-15 Units (has no administration in time range)  acetaminophen  (TYLENOL ) tablet 650 mg (has no administration in time range)    Or  acetaminophen  (TYLENOL ) suppository 650 mg (has no administration in time range)  ipratropium-albuterol  (DUONEB) 0.5-2.5 (3) MG/3ML nebulizer solution 3 mL (has no administration in time range)  ipratropium-albuterol  (DUONEB) 0.5-2.5 (3) MG/3ML nebulizer solution 3  mL (has no administration in time range)  methylPREDNISolone  sodium succinate (SOLU-MEDROL ) 40 mg/mL injection 40 mg (has no administration in time range)  cefTRIAXone  (ROCEPHIN ) 1 g in sodium chloride  0.9 % 100 mL IVPB (has no administration in time range)  potassium chloride  10 mEq in 100 mL IVPB (has no administration in time range)  albuterol  (PROVENTIL ) (2.5 MG/3ML) 0.083% nebulizer solution 2.5 mg (2.5 mg Nebulization Given 01/30/24 2058)  magnesium  sulfate IVPB 2 g 50 mL (0 g Intravenous Stopped 01/30/24 2355)                                    Medical Decision Making Amount and/or Complexity of Data Reviewed Labs: ordered. Radiology: ordered.  Risk Prescription drug management. Decision regarding hospitalization.   This patient presents to the ED for concern of shortness of breath, this involves an extensive number of treatment options, and is a complaint that carries with it a high risk of complications and morbidity.  The differential diagnosis includes COPD exacerbation, CHF, worsening of cancer, anemia, acidosis, anxiety   Co morbidities / Chronic conditions that complicate the patient evaluation  polysubstance abuse, HTN, DM, HLD, obesity, depression, CVA, COPD   Additional history obtained:  Additional history obtained from EMR External records from outside source obtained and reviewed including EMS   Lab Tests:  I Ordered, and personally interpreted labs.  The pertinent results include: Normal hemoglobin, mild leukocytosis, normal  kidney function, vomiting Asimia and mild hypokalemia with otherwise normal electrolytes   Imaging Studies ordered:  I ordered imaging studies including chest x-ray I independently visualized and interpreted imaging which showed redemonstration of known widespread metastases I agree with the radiologist interpretation   Cardiac Monitoring: / EKG:  The patient was maintained on a cardiac monitor.  I personally viewed and interpreted the cardiac monitored which showed an underlying rhythm of: Sinus rhythm   Problem List / ED Course / Critical interventions / Medication management  Patient presenting for respiratory distress.  This was noted by EMS on their arrival on scene.  She was reportedly wearing her CPAP on scene but had tripoding and difficulty breathing despite this.  She was treated with DuoNebs, Solu-Medrol , and CPAP prior to arrival.  On arrival, patient tolerating CPAP well.  She was placed on BiPAP.  On auscultation, she has continued diminished breath sounds and wheezing.  Albuterol  was ordered.  Workup was initiated.  Notably, patient also describes ongoing rectal bleeding for the past 3 days.  Type and screen ordered.  Patient's hemoglobin today is normal.  Her lab work was notable for hypokalemia and hypomagnesemia.  Replacement electrolytes were ordered.  Patient tolerating BiPAP well.  She was admitted for further management. I ordered medication including albuterol  for COPD; magnesium  sulfate for hypomagnesemia Reevaluation of the patient after these medicines showed that the patient improved I have reviewed the patients home medicines and have made adjustments as needed  Social Determinants of Health:  Frequent hospitalizations  CRITICAL CARE Performed by: Bernardino Fireman   Total critical care time: 32 minutes  Critical care time was exclusive of separately billable procedures and treating other patients.  Critical care was necessary to treat or prevent imminent or  life-threatening deterioration.  Critical care was time spent personally by me on the following activities: development of treatment plan with patient and/or surrogate as well as nursing, discussions with consultants, evaluation of patient's response to treatment, examination of patient,  obtaining history from patient or surrogate, ordering and performing treatments and interventions, ordering and review of laboratory studies, ordering and review of radiographic studies, pulse oximetry and re-evaluation of patient's condition.      Final diagnoses:  COPD exacerbation (HCC)  Acute on chronic respiratory failure with hypoxia [facility])    ED Discharge Orders     None          [name] Motto, MD 01/31/24 [phone]  "

## 2024-01-31 ENCOUNTER — Ambulatory Visit: Admitting: Acute Care

## 2024-01-31 ENCOUNTER — Telehealth: Payer: Self-pay

## 2024-01-31 ENCOUNTER — Inpatient Hospital Stay (HOSPITAL_COMMUNITY)

## 2024-01-31 DIAGNOSIS — I252 Old myocardial infarction: Secondary | ICD-10-CM | POA: Diagnosis not present

## 2024-01-31 DIAGNOSIS — J9601 Acute respiratory failure with hypoxia: Secondary | ICD-10-CM | POA: Diagnosis not present

## 2024-01-31 DIAGNOSIS — K649 Unspecified hemorrhoids: Secondary | ICD-10-CM | POA: Diagnosis present

## 2024-01-31 DIAGNOSIS — Z1152 Encounter for screening for COVID-19: Secondary | ICD-10-CM | POA: Diagnosis not present

## 2024-01-31 DIAGNOSIS — E8721 Acute metabolic acidosis: Secondary | ICD-10-CM | POA: Diagnosis present

## 2024-01-31 DIAGNOSIS — E876 Hypokalemia: Secondary | ICD-10-CM | POA: Diagnosis present

## 2024-01-31 DIAGNOSIS — E119 Type 2 diabetes mellitus without complications: Secondary | ICD-10-CM

## 2024-01-31 DIAGNOSIS — G4733 Obstructive sleep apnea (adult) (pediatric): Secondary | ICD-10-CM

## 2024-01-31 DIAGNOSIS — G40909 Epilepsy, unspecified, not intractable, without status epilepticus: Secondary | ICD-10-CM | POA: Diagnosis present

## 2024-01-31 DIAGNOSIS — F32A Depression, unspecified: Secondary | ICD-10-CM | POA: Diagnosis present

## 2024-01-31 DIAGNOSIS — I10 Essential (primary) hypertension: Secondary | ICD-10-CM | POA: Diagnosis not present

## 2024-01-31 DIAGNOSIS — I5032 Chronic diastolic (congestive) heart failure: Secondary | ICD-10-CM | POA: Diagnosis present

## 2024-01-31 DIAGNOSIS — I872 Venous insufficiency (chronic) (peripheral): Secondary | ICD-10-CM | POA: Diagnosis present

## 2024-01-31 DIAGNOSIS — C3492 Malignant neoplasm of unspecified part of left bronchus or lung: Secondary | ICD-10-CM | POA: Diagnosis present

## 2024-01-31 DIAGNOSIS — Z6841 Body Mass Index (BMI) 40.0 and over, adult: Secondary | ICD-10-CM | POA: Diagnosis not present

## 2024-01-31 DIAGNOSIS — J441 Chronic obstructive pulmonary disease with (acute) exacerbation: Secondary | ICD-10-CM | POA: Diagnosis present

## 2024-01-31 DIAGNOSIS — C3491 Malignant neoplasm of unspecified part of right bronchus or lung: Secondary | ICD-10-CM | POA: Diagnosis present

## 2024-01-31 DIAGNOSIS — J439 Emphysema, unspecified: Secondary | ICD-10-CM | POA: Diagnosis present

## 2024-01-31 DIAGNOSIS — R1909 Other intra-abdominal and pelvic swelling, mass and lump: Secondary | ICD-10-CM | POA: Diagnosis not present

## 2024-01-31 DIAGNOSIS — J449 Chronic obstructive pulmonary disease, unspecified: Secondary | ICD-10-CM | POA: Diagnosis not present

## 2024-01-31 DIAGNOSIS — F1010 Alcohol abuse, uncomplicated: Secondary | ICD-10-CM | POA: Diagnosis present

## 2024-01-31 DIAGNOSIS — Z720 Tobacco use: Secondary | ICD-10-CM | POA: Diagnosis not present

## 2024-01-31 DIAGNOSIS — C771 Secondary and unspecified malignant neoplasm of intrathoracic lymph nodes: Secondary | ICD-10-CM | POA: Diagnosis present

## 2024-01-31 DIAGNOSIS — R911 Solitary pulmonary nodule: Secondary | ICD-10-CM | POA: Diagnosis not present

## 2024-01-31 DIAGNOSIS — I110 Hypertensive heart disease with heart failure: Secondary | ICD-10-CM | POA: Diagnosis present

## 2024-01-31 DIAGNOSIS — F17210 Nicotine dependence, cigarettes, uncomplicated: Secondary | ICD-10-CM

## 2024-01-31 DIAGNOSIS — J9621 Acute and chronic respiratory failure with hypoxia: Secondary | ICD-10-CM | POA: Diagnosis present

## 2024-01-31 DIAGNOSIS — E66813 Obesity, class 3: Secondary | ICD-10-CM | POA: Diagnosis present

## 2024-01-31 DIAGNOSIS — C786 Secondary malignant neoplasm of retroperitoneum and peritoneum: Secondary | ICD-10-CM | POA: Diagnosis present

## 2024-01-31 DIAGNOSIS — J159 Unspecified bacterial pneumonia: Secondary | ICD-10-CM | POA: Diagnosis present

## 2024-01-31 DIAGNOSIS — R918 Other nonspecific abnormal finding of lung field: Secondary | ICD-10-CM | POA: Diagnosis not present

## 2024-01-31 LAB — CBC
HCT: 38.9 % (ref 36.0–46.0)
HCT: 39.7 % (ref 36.0–46.0)
Hemoglobin: 13.1 g/dL (ref 12.0–15.0)
Hemoglobin: 13.4 g/dL (ref 12.0–15.0)
MCH: 30 pg (ref 26.0–34.0)
MCH: 30.2 pg (ref 26.0–34.0)
MCHC: 33.7 g/dL (ref 30.0–36.0)
MCHC: 33.8 g/dL (ref 30.0–36.0)
MCV: 89.2 fL (ref 80.0–100.0)
MCV: 89.6 fL (ref 80.0–100.0)
Platelets: 371 K/uL (ref 150–400)
Platelets: 346 K/uL (ref 150–400)
RBC: 4.36 MIL/uL (ref 3.87–5.11)
RBC: 4.43 MIL/uL (ref 3.87–5.11)
RDW: 13.8 % (ref 11.5–15.5)
RDW: 13.9 % (ref 11.5–15.5)
WBC: 8.1 K/uL (ref 4.0–10.5)
WBC: 9.6 K/uL (ref 4.0–10.5)
nRBC: 0 % (ref 0.0–0.2)
nRBC: 0 % (ref 0.0–0.2)

## 2024-01-31 LAB — GLUCOSE, CAPILLARY
Glucose-Capillary: 119 mg/dL — ABNORMAL HIGH (ref 70–99)
Glucose-Capillary: 166 mg/dL — ABNORMAL HIGH (ref 70–99)
Glucose-Capillary: 131 mg/dL — ABNORMAL HIGH (ref 70–99)
Glucose-Capillary: 152 mg/dL — ABNORMAL HIGH (ref 70–99)

## 2024-01-31 LAB — COMPREHENSIVE METABOLIC PANEL WITH GFR
ALT: 9 U/L (ref 0–44)
AST: 15 U/L (ref 15–41)
Albumin: 4.4 g/dL (ref 3.5–5.0)
Alkaline Phosphatase: 147 U/L — ABNORMAL HIGH (ref 38–126)
Anion gap: 11 (ref 5–15)
BUN: 6 mg/dL (ref 6–20)
CO2: 25 mmol/L (ref 22–32)
Calcium: 10 mg/dL (ref 8.9–10.3)
Chloride: 98 mmol/L (ref 98–111)
Creatinine, Ser: 0.6 mg/dL (ref 0.44–1.00)
GFR, Estimated: >60 mL/min
Glucose, Bld: 127 mg/dL — ABNORMAL HIGH (ref 70–99)
Potassium: 4.5 mmol/L (ref 3.5–5.1)
Sodium: 135 mmol/L (ref 135–145)
Total Bilirubin: <0.2 mg/dL (ref 0.0–1.2)
Total Protein: 7.1 g/dL (ref 6.5–8.1)

## 2024-01-31 LAB — HEMOGLOBIN A1C
Hgb A1c MFr Bld: 5.8 % — ABNORMAL HIGH (ref 4.8–5.6)
Mean Plasma Glucose: 119.76 mg/dL

## 2024-01-31 LAB — MRSA NEXT GEN BY PCR, NASAL: MRSA by PCR Next Gen: NOT DETECTED

## 2024-01-31 LAB — TROPONIN T, HIGH SENSITIVITY
Troponin T High Sensitivity: 16 ng/L (ref 0–19)
Troponin T High Sensitivity: 18 ng/L (ref 0–19)

## 2024-01-31 MED ORDER — ORAL CARE MOUTH RINSE: 15.0000 mL | OROMUCOSAL | Status: DC | PRN

## 2024-01-31 MED ORDER — CARBAMAZEPINE 200 MG PO TABS
400.0000 mg | ORAL_TABLET | Freq: Two times a day (BID) | ORAL | Status: DC
  Administered 2024-01-31 – 2024-02-03 (×7): 400 mg via ORAL
  Filled 2024-01-31 (×8): qty 2

## 2024-01-31 MED ORDER — METHYLPREDNISOLONE SODIUM SUCC 40 MG IJ SOLR
40.0000 mg | Freq: Two times a day (BID) | INTRAMUSCULAR | Status: DC
  Administered 2024-01-31 – 2024-02-01 (×3): 40 mg via INTRAVENOUS
  Filled 2024-01-31 (×3): qty 1

## 2024-01-31 MED ORDER — POTASSIUM CHLORIDE 10 MEQ/100ML IV SOLN
10.0000 meq | INTRAVENOUS | Status: AC
  Administered 2024-01-31 (×2): 10 meq via INTRAVENOUS
  Filled 2024-01-31 (×2): qty 100

## 2024-01-31 MED ORDER — FUROSEMIDE 40 MG PO TABS
80.0000 mg | ORAL_TABLET | Freq: Every day | ORAL | Status: DC
  Administered 2024-01-31 – 2024-02-03 (×3): 80 mg via ORAL
  Filled 2024-01-31 (×3): qty 2

## 2024-01-31 MED ORDER — IPRATROPIUM-ALBUTEROL 0.5-2.5 (3) MG/3ML IN SOLN
3.0000 mL | Freq: Four times a day (QID) | RESPIRATORY_TRACT | Status: DC
  Administered 2024-01-31: 3 mL via RESPIRATORY_TRACT
  Filled 2024-01-31: qty 3

## 2024-01-31 MED ORDER — GABAPENTIN 300 MG PO CAPS
600.0000 mg | ORAL_CAPSULE | Freq: Three times a day (TID) | ORAL | Status: DC
  Administered 2024-01-31: 600 mg via ORAL
  Filled 2024-01-31: qty 2

## 2024-01-31 MED ORDER — SODIUM CHLORIDE 0.9 % IV SOLN: INTRAVENOUS | Status: AC | PRN

## 2024-01-31 MED ORDER — ACETAMINOPHEN 325 MG PO TABS
650.0000 mg | ORAL_TABLET | Freq: Four times a day (QID) | ORAL | Status: DC | PRN
  Administered 2024-01-31 – 2024-02-02 (×5): 650 mg via ORAL
  Filled 2024-01-31 (×5): qty 2

## 2024-01-31 MED ORDER — REVEFENACIN 175 MCG/3ML IN SOLN
175.0000 ug | Freq: Every day | RESPIRATORY_TRACT | Status: DC
  Administered 2024-01-31 – 2024-02-03 (×4): 175 ug via RESPIRATORY_TRACT
  Filled 2024-01-31 (×4): qty 3

## 2024-01-31 MED ORDER — LISINOPRIL 10 MG PO TABS
30.0000 mg | ORAL_TABLET | Freq: Every day | ORAL | Status: DC
  Administered 2024-01-31 – 2024-02-03 (×4): 30 mg via ORAL
  Filled 2024-01-31 (×4): qty 3

## 2024-01-31 MED ORDER — BUDESONIDE 0.5 MG/2ML IN SUSP
0.5000 mg | Freq: Two times a day (BID) | RESPIRATORY_TRACT | Status: DC
  Administered 2024-01-31 – 2024-02-03 (×7): 0.5 mg via RESPIRATORY_TRACT
  Filled 2024-01-31 (×7): qty 2

## 2024-01-31 MED ORDER — ACETAMINOPHEN 650 MG RE SUPP: 650.0000 mg | Freq: Four times a day (QID) | RECTAL | Status: DC | PRN

## 2024-01-31 MED ORDER — POTASSIUM CHLORIDE CRYS ER 20 MEQ PO TBCR
20.0000 meq | EXTENDED_RELEASE_TABLET | Freq: Every day | ORAL | Status: DC
  Administered 2024-01-31 – 2024-02-03 (×3): 20 meq via ORAL
  Filled 2024-01-31 (×3): qty 1

## 2024-01-31 MED ORDER — ORAL CARE MOUTH RINSE
15.0000 mL | OROMUCOSAL | Status: DC
  Administered 2024-01-31 – 2024-02-02 (×4): 15 mL via OROMUCOSAL

## 2024-01-31 MED ORDER — SODIUM CHLORIDE 0.9 % IV SOLN
1.0000 g | INTRAVENOUS | Status: DC
  Administered 2024-01-31 – 2024-02-01 (×2): 1 g via INTRAVENOUS
  Filled 2024-01-31 (×2): qty 10

## 2024-01-31 MED ORDER — INSULIN ASPART 100 UNIT/ML IJ SOLN
0.0000 [IU] | Freq: Three times a day (TID) | INTRAMUSCULAR | Status: DC
  Administered 2024-01-31: 2 [IU] via SUBCUTANEOUS
  Administered 2024-01-31: 3 [IU] via SUBCUTANEOUS
  Administered 2024-02-01 (×2): 2 [IU] via SUBCUTANEOUS
  Administered 2024-02-02: 3 [IU] via SUBCUTANEOUS
  Filled 2024-01-31: qty 2
  Filled 2024-01-31 (×2): qty 3
  Filled 2024-01-31 (×2): qty 2

## 2024-01-31 MED ORDER — CHLORHEXIDINE GLUCONATE CLOTH 2 % EX PADS
6.0000 | MEDICATED_PAD | Freq: Every day | CUTANEOUS | Status: DC
  Administered 2024-01-31 – 2024-02-02 (×3): 6 via TOPICAL

## 2024-01-31 MED ORDER — POTASSIUM CHLORIDE 10 MEQ/100ML IV SOLN: 10.0000 meq | INTRAVENOUS | Status: DC

## 2024-01-31 MED ORDER — ARFORMOTEROL TARTRATE 15 MCG/2ML IN NEBU
15.0000 ug | INHALATION_SOLUTION | Freq: Two times a day (BID) | RESPIRATORY_TRACT | Status: DC
  Administered 2024-01-31 – 2024-02-03 (×7): 15 ug via RESPIRATORY_TRACT
  Filled 2024-01-31 (×7): qty 2

## 2024-01-31 MED ORDER — AMLODIPINE BESYLATE 10 MG PO TABS
10.0000 mg | ORAL_TABLET | Freq: Every day | ORAL | Status: DC
  Administered 2024-01-31 – 2024-02-03 (×4): 10 mg via ORAL
  Filled 2024-01-31 (×4): qty 1

## 2024-01-31 MED ORDER — GABAPENTIN 300 MG PO CAPS
600.0000 mg | ORAL_CAPSULE | Freq: Two times a day (BID) | ORAL | Status: DC
  Administered 2024-01-31 – 2024-02-03 (×6): 600 mg via ORAL
  Filled 2024-01-31 (×6): qty 2

## 2024-01-31 MED ORDER — IPRATROPIUM-ALBUTEROL 0.5-2.5 (3) MG/3ML IN SOLN
3.0000 mL | RESPIRATORY_TRACT | Status: DC | PRN
  Administered 2024-02-01: 3 mL via RESPIRATORY_TRACT

## 2024-01-31 MED ORDER — ENOXAPARIN SODIUM 40 MG/0.4ML IJ SOSY: 40.0000 mg | PREFILLED_SYRINGE | INTRAMUSCULAR | Status: DC

## 2024-01-31 MED ORDER — IPRATROPIUM-ALBUTEROL 0.5-2.5 (3) MG/3ML IN SOLN
3.0000 mL | RESPIRATORY_TRACT | Status: DC
  Administered 2024-01-31: 3 mL via RESPIRATORY_TRACT
  Filled 2024-01-31: qty 3

## 2024-01-31 NOTE — Progress Notes (Addendum)
 [facility] PROGRESS NOTE    Progress Note  Denise [name]  FMW:[id number] DOB: 1970/12/05 DOA: 01/30/2024 PCP: Duwaine Annabella SAILOR, FNP     Brief Narrative:   Denise [name] is an 54 y.o. female past medical history of polysubstance abuse, essential hypertension, diabetes mellitus type 2, CVA COPD comes in for painful rectal bleeding that started 3 days prior to admission,she was recently discharged from the hospital about 2 months ago for COPD exacerbation and was found to have a lung mass at that time pulmonary was consulted and she was made a follow-up appointment for outpatient bronchoscopy and biopsy of lung mass comes into the ER as she has been more short of breath on arrival to her house EMS found her to be hypoxic in the ED she was found to be short of breath wheezing started on steroids and BiPAP. CT scan of the abdomen pelvis showed left upper lobe 2 x 2 cm mass and numerous bilateral pulmonary nodules 5 to 10 mm mediastinal lymphadenopathy.  There is also peritoneal carcinomatosis  Assessment/Plan:   Acute respiratory failure with hypoxia (HCC) probably due to COPD exacerbation: Patient was placed on nebulizer steroids and antibiotics. Currently on BiPAP comfortably satting greater 90%. Has now been weaned to liters of oxygen. CT of the chest showed enlarging lung mass. Now she is breathing comfortably in bed with BiPAP on. Pulmonary has been consulted.  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 She has canceled her bronchoscopy 3 times one of them being a no-show.  Diabetes mellitus type 2: Last A1c of 6.2. Continue sliding scale insulin  hold metformin .  Rectal bleeding: Hemoglobin stable.  Cognitive impairment: Noted.  Sleep apnea:  continue CPAP at night.  [facility] hypertension: Continue amlodipine ,  Lasix  and lisinopril .    DVT prophylaxis: loveno Family Communication:none Status is: Inpatient Remains inpatient appropriate because: Acute respiratory failure with hypoxia    Code Status:     Code Status Orders  (From admission, onward)           Start     Ordered   01/31/24 0000  Full code  Continuous       Question:  By:  Answer:  Consent: discussion documented in EHR   01/31/24 0000           Code Status History     Date Active Date Inactive Code Status Order ID Comments User Context   01/10/2024 1237 01/11/2024 1835 Full Code [id number]  Doretha Folks, MD ED   11/30/2023 2125 12/02/2023 1447 Full Code [id number]  Pearlean Tully BRAVO, MD Inpatient   04/08/2023 1451 04/10/2023 1746 Full Code [id number]  Zella Katha HERO, MD Inpatient   02/17/2023 1552 02/20/2023 1722 Full Code [id number]  Lou Claretta HERO, MD ED   07/28/2021 2310 08/01/2021 1801 Full Code [id number]  Charlton Evalene RAMAN, MD Inpatient   09/25/2019 1622 09/27/2019 1445 Full Code [id number]  Cindy Garnette POUR, MD ED   02/07/2015 1601 02/09/2015 1542 Full Code [id number]  Alvia Doyal HERO, MD ED         IV Access:   Peripheral IV   Procedures and diagnostic studies:   CT CHEST ABDOMEN PELVIS WO CONTRAST Result Date: 01/31/2024 EXAM: CT CHEST, ABDOMEN AND PELVIS WITHOUT CONTRAST 01/31/2024 01:39:32 AM TECHNIQUE: CT of the chest, abdomen and pelvis was performed without the administration of intravenous contrast. Multiplanar reformatted images are provided for  review. Automated exposure control, iterative reconstruction, and/or weight based adjustment of the mA/kV was utilized to reduce the radiation dose to as low as reasonably achievable. COMPARISON: None available. CLINICAL HISTORY: Respiratory illness, nondiagnostic xray. FINDINGS: CHEST: MEDIASTINUM AND LYMPH NODES: Heart and pericardium are unremarkable. The central airways are clear. Prevascular window adenopathy along with right paratracheal and subcarinal  adenopathy again noted, unchanged. No axillary adenopathy. Difficult to assess the hila without intravenous contrast. LUNGS AND PLEURA: Posterior left upper lobe nodule measures 2.1 x 1.9 cm compared to 1.9 cm maximally on prior study. Numerous bilateral 5 to 10 mm pulmonary nodules on prior study and compatible with metastatic disease. No focal consolidation or pulmonary edema. No pleural effusion. No pneumothorax. ABDOMEN AND PELVIS: LIVER: Unremarkable. GALLBLADDER AND BILE DUCTS: Unremarkable. No biliary ductal dilatation. SPLEEN: No acute abnormality. PANCREAS: No acute abnormality. ADRENAL GLANDS: No acute abnormality. KIDNEYS, URETERS AND BLADDER: No stones in the kidneys or ureters. No hydronephrosis. No perinephric or periureteral stranding. Urinary bladder is unremarkable. GI AND BOWEL: Stomach demonstrates no acute abnormality. Scattered colonic diverticulosis. No active diverticulitis. There is no bowel obstruction. REPRODUCTIVE ORGANS: Calcified fibroid in the uterine fundus, stable. No adnexal mass. PERITONEUM AND RETROPERITONEUM: Nodularity in the omentum and peritoneum, worsening since prior study. Conglomerate omental mass in the left lower quadrant measures up to 6.4 cm. Stable 3 cm water density cystic structure in the retroperitoneum adjacent to the distal aorta and IVC compatible with a benign process. No ascites. No free air. VASCULATURE: Aorta is normal in caliber. Scattered aortic atherosclerosis. ABDOMINAL AND PELVIS LYMPH NODES: No lymphadenopathy. BONES AND SOFT TISSUES: No acute osseous abnormality. No focal soft tissue abnormality. IMPRESSION: 1. Posterior left upper lobe nodule measures 2.1 x 1.9 cm, increased from 1.9 cm on prior study. This may represent the primary malignancy. 2. Numerous bilateral 5 to 10 mm pulmonary nodules, compatible with metastatic disease. 3. Mediastinal adenopathy, unchanged. 4. Nodularity in the omentum and peritoneum, worsening since prior study, with a  conglomerate omental mass in the left lower quadrant measuring up to 6.4 cm compatible with metastatic disease. Electronically signed by: Franky Crease MD 01/31/2024 02:02 AM EST RP Workstation: [id number]   DG Chest Port 1 View Result Date: 01/30/2024 EXAM: 1 VIEW(S) XRAY OF THE CHEST 01/30/2024 09:23:00 PM COMPARISON: Portable chest 01/10/2024. CTA chest 11/30/2023. CLINICAL HISTORY: dyspnea FINDINGS: LINES, TUBES AND DEVICES: Overlying monitor leads. LUNGS AND PLEURA: There are numerous pulmonary nodules throughout the lung fields. Findings consistent with widespread pulmonary metastases. This was better seen on CT as well as an approximately 3 cm left upper lobe presumed primary malignancy superimposing adjacent the aortic knob. There is soft tissue fullness along both hilar silhouettes and AP window, consistent with adenopathy. This was seen previously and may have slightly progressed. No focal pneumonia is evident. No pleural effusion. No pneumothorax. HEART AND MEDIASTINUM: Cardiomegaly is stable. No vascular congestion is seen. There is a mildly tortuous aorta with atherosclerosis. BONES AND SOFT TISSUES: No acute osseous abnormality. IMPRESSION: 1. Stable cardiomegaly without vascular congestion or focal pneumonia. 2. Widespread pulmonary metastases with approximately 3 cm left upper lobe presumed primary malignancy partially visible, and hilar/AP window adenopathy possibly slightly progressed. Electronically signed by: Francis Quam MD 01/30/2024 09:34 PM EST RP Workstation: [id number]     Medical Consultants:   None.   Subjective:    Denise [name] she relates her breathing is slightly better.  Objective:    Vitals:   01/31/24 0300 01/31/24 0410 01/31/24 0500 01/31/24 0534  BP: (!) 172/83 (!) 158/62 (!) 149/69   Pulse: 79 78 77 82  Resp: 18 15 14 16   Temp:    98.7 F (37.1 C)  TempSrc:    Axillary  SpO2: 96% 95% 92% 92%  Weight:      Height:       SpO2: 92 % O2 Flow  Rate (L/min): 2 L/min FiO2 (%): 21 %   Intake/Output Summary (Last 24 hours) at 01/31/2024 0638 Last data filed at 01/31/2024 0531 Gross per 24 hour  Intake 655.9 ml  Output 150 ml  Net 505.9 ml   Filed Weights   01/31/24 0230  Weight: 121 kg    Exam: General exam: In no acute distress. Respiratory system: Good air movement and clear to auscultation. Cardiovascular system: S1 & S2 heard, RRR. No JVD. Gastrointestinal system: Abdomen is nondistended, soft and nontender.  Extremities: No pedal edema. Skin: No rashes, lesions or ulcers Psychiatry: Judgement and insight appear normal. Mood & affect appropriate.    Data Reviewed:    Labs: Basic Metabolic Panel: Recent Labs  Lab 01/30/24 2055  NA 135  K 3.4*  CL 96*  CO2 18*  GLUCOSE 103*  BUN 7  CREATININE 0.70  CALCIUM  9.5  MG 1.6*   GFR Estimated Creatinine Clearance: 103.6 mL/min (by C-G formula based on SCr of 0.7 mg/dL). Liver Function Tests: Recent Labs  Lab 01/30/24 2055  AST 17  ALT 11  ALKPHOS 148*  BILITOT <0.2  PROT 7.0  ALBUMIN 4.3   No results for input(s): LIPASE, AMYLASE in the last 168 hours. No results for input(s): AMMONIA in the last 168 hours. Coagulation profile No results for input(s): INR, PROTIME in the last 168 hours. COVID-19 Labs  No results for input(s): DDIMER, FERRITIN, LDH, CRP in the last 72 hours.  Lab Results  Component Value Date   SARSCOV2NAA NEGATIVE 01/30/2024   SARSCOV2NAA NEGATIVE 01/10/2024   SARSCOV2NAA NEGATIVE 11/30/2023   SARSCOV2NAA NEGATIVE 04/08/2023    CBC: Recent Labs  Lab 01/30/24 2055  WBC 12.0*  NEUTROABS 6.8  HGB 13.0  HCT 38.3  MCV 88.7  PLT 342   Cardiac Enzymes: No results for input(s): CKTOTAL, CKMB, CKMBINDEX, TROPONINI in the last 168 hours. BNP (last 3 results) Recent Labs    11/30/23 0908 01/30/24 2055  PROBNP <50.0 <50.0   CBG: No results for input(s): GLUCAP in the last 168  hours. D-Dimer: No results for input(s): DDIMER in the last 72 hours. Hgb A1c: No results for input(s): HGBA1C in the last 72 hours. Lipid Profile: No results for input(s): CHOL, HDL, LDLCALC, TRIG, CHOLHDL, LDLDIRECT in the last 72 hours. Thyroid  function studies: No results for input(s): TSH, T4TOTAL, T3FREE, THYROIDAB in the last 72 hours.  Invalid input(s): FREET3 Anemia work up: No results for input(s): VITAMINB12, FOLATE, FERRITIN, TIBC, IRON, RETICCTPCT in the last 72 hours. Sepsis Labs: Recent Labs  Lab 01/30/24 2055  WBC 12.0*   Microbiology Recent Results (from the past 240 hours)  Resp panel by RT-PCR (RSV, Flu A&B, Covid) Anterior Nasal Swab     Status: None   Collection Time: 01/30/24  8:55 PM   Specimen: Anterior Nasal Swab  Result Value Ref Range Status   SARS Coronavirus 2 by RT PCR NEGATIVE NEGATIVE Final    Comment: (NOTE) SARS-CoV-2 target nucleic acids are NOT DETECTED.  The SARS-CoV-2 RNA is generally detectable in upper respiratory specimens during the acute phase of infection. The lowest concentration of SARS-CoV-2 viral copies this assay  can detect is 138 copies/mL. A negative result does not preclude SARS-Cov-2 infection and should not be used as the sole basis for treatment or other patient management decisions. A negative result may occur with  improper specimen collection/handling, submission of specimen other than nasopharyngeal swab, presence of viral mutation(s) within the areas targeted by this assay, and inadequate number of viral copies(<138 copies/mL). A negative result must be combined with clinical observations, patient history, and epidemiological information. The expected result is Negative.  Fact Sheet for Patients:  bloggercourse.com  Fact Sheet for Healthcare Providers:  seriousbroker.it  This test is no t yet approved or cleared by the  [place]  FDA and  has been authorized for detection and/or diagnosis of SARS-CoV-2 by FDA under an Emergency Use Authorization (EUA). This EUA will remain  in effect (meaning this test can be used) for the duration of the COVID-19 declaration under Section 564(b)(1) of the Act, 21 U.S.C.section 360bbb-3(b)(1), unless the authorization is terminated  or revoked sooner.       Influenza A by PCR NEGATIVE NEGATIVE Final   Influenza B by PCR NEGATIVE NEGATIVE Final    Comment: (NOTE) The Xpert Xpress SARS-CoV-2/FLU/RSV plus assay is intended as an aid in the diagnosis of influenza from Nasopharyngeal swab specimens and should not be used as a sole basis for treatment. Nasal washings and aspirates are unacceptable for Xpert Xpress SARS-CoV-2/FLU/RSV testing.  Fact Sheet for Patients: bloggercourse.com  Fact Sheet for Healthcare Providers: seriousbroker.it  This test is not yet approved or cleared by the [place]  FDA and has been authorized for detection and/or diagnosis of SARS-CoV-2 by FDA under an Emergency Use Authorization (EUA). This EUA will remain in effect (meaning this test can be used) for the duration of the COVID-19 declaration under Section 564(b)(1) of the Act, 21 U.S.C. section 360bbb-3(b)(1), unless the authorization is terminated or revoked.     Resp Syncytial Virus by PCR NEGATIVE NEGATIVE Final    Comment: (NOTE) Fact Sheet for Patients: bloggercourse.com  Fact Sheet for Healthcare Providers: seriousbroker.it  This test is not yet approved or cleared by the [place]  FDA and has been authorized for detection and/or diagnosis of SARS-CoV-2 by FDA under an Emergency Use Authorization (EUA). This EUA will remain in effect (meaning this test can be used) for the duration of the COVID-19 declaration under Section 564(b)(1) of the Act, 21 U.S.C. section  360bbb-3(b)(1), unless the authorization is terminated or revoked.  Performed at [facility] [address] [place], [place] [address]   MRSA Next Gen by PCR, Nasal     Status: None   Collection Time: 01/31/24  2:28 AM   Specimen: Nasal Mucosa; Nasal Swab  Result Value Ref Range Status   MRSA by PCR Next Gen NOT DETECTED NOT DETECTED Final    Comment: (NOTE) The GeneXpert MRSA Assay (FDA approved for NASAL specimens only), is one component of a comprehensive MRSA colonization surveillance program. It is not intended to diagnose MRSA infection nor to guide or monitor treatment for MRSA infections. Test performance is not FDA approved in patients less than 79 years old. Performed at [facility], [address] [place], [place] [address]      Medications:    amLODipine   10 mg Oral Daily   carbamazepine   400 mg Oral BID   Chlorhexidine  Gluconate Cloth  6 each Topical Daily   furosemide   80 mg Oral Daily   gabapentin   600 mg Oral TID   insulin   aspart  0-15 Units Subcutaneous TID WC   ipratropium-albuterol   3 mL Nebulization Q6H   lisinopril   30 mg Oral Daily   methylPREDNISolone  (SOLU-MEDROL ) injection  40 mg Intravenous Q12H   mouth rinse  15 mL Mouth Rinse 4 times per day   potassium chloride  SA  20 mEq Oral Daily   Continuous Infusions:  sodium chloride  Stopped (01/31/24 0531)   cefTRIAXone  (ROCEPHIN )  IV Stopped (01/31/24 0102)      LOS: 0 days   Erle Odell Castor  [facility]  01/31/2024, 6:38 AM

## 2024-01-31 NOTE — Consult Note (Addendum)
 [facility] CONSULT NOTE  Patient Care Team: Duwaine Annabella SAILOR, FNP as PCP - General (Family Medicine) Santo Stanly LABOR, MD as PCP - Cardiology (Cardiology) Skeet Juliene SAUNDERS, DO as Consulting Physician (Neurology)  CHIEF COMPLAINTS/PURPOSE OF CONSULTATION:  Lung mass  REFERRING PHYSICIAN: Dr. Franky   HISTORY OF PRESENTING ILLNESS:  Denise [name] 54 y.o. female is a 54 year old female patient who presented on 01/31/2024 with complaints of shortness of breath and rectal bleeding.  Patient was previously admitted in November 2025 with COPD exacerbation at which time a lung mass was discovered.  There was an outpatient plan to do a bronchoscopy, however patient canceled or no-showed on 3 separate occasions. Medical oncology is now asked to evaluate. Patient is seen awake and alert laying in bed.  She is an obese female who served as principal historian.  She appears to be very short of breath and is on O2 which she states that she has been on home O2 for several years.  Patient relates chief complaints as documented above.  Reports that she went to see her PCP who advised ED.  Denies chest pain.  Admits to right upper quadrant pain, she points to under her right breast.  Admits to rectal bleeding which she states is related to hemorrhoids.  Admits to generalized weakness and fatigue. Medical history significant for lung mass.  History of COPD necessitating home O2 and CPAP, hypertension, hyperlipidemia, diabetes, stroke, heart failure, and seizure disorder. Surgical history is noncontributory, includes 2 C-sections. Family oncologic history is denied. Social history is significant for tobacco use, 1 pack every 2.5 days, started when I was very young.  Admits to alcohol use 1 pint approximately every 2 weeks.  Admits to drug use, specifically crack cocaine which she admits to smoking yesterday prior to coming into the hospital.  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  Lives with spouse and son. States she does not ambulate on her own, tries to use rollator but very difficult.  Has home health aide that comes to her home daily to assist with ADLs.   I have reviewed her chart and materials related to her cancer extensively and collaborated history with the patient. Summary of oncologic history is as follows: Oncology History   No problem history exists.    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  No metastatic disease was identified in the abdomen or pelvis. - Repeat CT chest done today 01/31/2024 shows increase in size of LUL nodule from 1.9 cm to 2.1 x 1.9 cm.  There are also numerous bilateral pulmonary nodules compatible with metastatic disease.  Mediastinal adenopathy is unchanged.  Omental mass and left lower quadrant worsening. - Patient had been scheduled for bronchoscopy after November 2025 admission however she canceled on 3 occasions.  Patient states today that she had no transportation and had to cancel those appointments. - Needs bronchoscopy and definitive oncologic diagnosis for treatment planning purposes. -Medical oncology/Dr. Sherrod will make further evaluation and treatment recommendations.  Chronic hypoxic respiratory failure Shortness of breath COPD exacerbation - Patient reports that she is on continuous oxygen at home however has felt more short of breath in the last few days. - Continue respiratory therapy as ordered.  Rectal bleeding -Patient states that she has a history of hemorrhoids  and PCP had given her some cream however this was not stopping the rectal bleeding. - Consideration for GI evaluation - Continue to monitor CBC with differential  Tobacco use  disorder Drug use disorder Alcohol use disorder Depression History of suicidal ideation  - Patient admits to current smoker 2.5 pack/day.  Also admits to smoking crack cocaine, as recently as yesterday. - Consideration for CIWA protocol - Encourage cessation  Hypertension Hyperlipidemia Diabetes - Monitor blood pressure.  Continue antihypertensives -Monitor blood glucose levels.  Insulin  per protocol. 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   Chronic diastolic CHF (congestive heart failure) 07/29/2021   Class 3 obesity with alveolar hypoventilation, serious comorbidity, and body mass index (BMI) of 50.0 to 59.9 in adult 06/12/2020   CVA (cerebrovascular accident) 2017   2017 MRI - chronic left lenticulostriate territory hemorrhage or hemorrhagic infarct, with hemosiderin, and encephalomalacia affecting both the caudate and lentiform nucleus     Decreased mobility 06/15/2021   DOE (dyspnea on exertion) 06/12/2020   Essential hypertension 12/18/2014   Fibroid uterus 12/18/2014   Heart murmur, systolic 06/29/2021   History of alcohol abuse 02/16/2017   History of cocaine use 02/16/2017   History of latent syphilis 02/16/2017   Hyperlipidemia associated with type 2 diabetes mellitus 11/07/2020   Incontinence of urine    Lacunar infarction    MRI - chronic lacunar infarct within the posterior right lentiform nucleus.     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   PTSD (post-traumatic stress disorder)    Pulmonary emphysema 08/31/2021   QT prolongation 06/29/2021   Right sided weakness    Social anxiety disorder 11/03/2017   Syncope 02/07/2015   TIA (transient ischemic attack)    Tobacco use 04/14/2015   Type 2 diabetes mellitus 10/21/2017   Venous stasis dermatitis of both lower extremities 06/15/2021    SURGICAL HISTORY: Past Surgical History:  Procedure Laterality Date   CESAREAN SECTION     x 2    SOCIAL HISTORY: Social History   Socioeconomic History   Marital status: Married    Spouse name: Carlin Fanti   Number of children: 3   Years of education: 6   Highest education level: 6th grade  Occupational History   Occupation: Disability  Tobacco Use   Smoking status: Every Day    Current packs/day: 0.40    Types: Cigarettes   Smokeless tobacco: Never   Tobacco comments:    Smoking 1 pack of cigarettes a week. 09/21/21 Tay    Patients states she is smoking about 8 cigarettes a day now. AB, CMA 12-08-2022     Pt states she is smoking about 3 cigarettes a day now. CC, CMA 06/22/2023  Vaping Use   Vaping status: Never Used  Substance and Sexual Activity   Alcohol use: Yes  Alcohol/week: 2.0 standard drinks of alcohol    Types: 2 Standard drinks or equivalent per week    Comment: occas   Drug use: Not Currently    Types: Crack cocaine   Sexual activity: Yes    Partners: Male    Birth control/protection: None  Other Topics Concern   Not on file  Social History Narrative   Right Handed   Social Drivers of Health   Tobacco Use: High Risk (01/30/2024)   Patient History    Smoking Tobacco Use: Every Day    Smokeless Tobacco Use: Never    Passive Exposure: Not on file  Financial Resource Strain: Not on File (05/14/2021)   Received from [employer]    Financial Resource Strain: 0  Food Insecurity: No  Food Insecurity (01/31/2024)   Epic    Worried About Programme Researcher, Broadcasting/film/video in the Last Year: Never true    Ran Out of Food in the Last Year: Never true  Transportation Needs: No Transportation Needs (01/31/2024)   Epic    Lack of Transportation (Medical): No    Lack of Transportation (Non-Medical): No  Physical Activity: Not on File (05/14/2021)   Received from [facility]   Physical Activity    Physical Activity: 0  Stress: Not on File (05/14/2021)   Received from [facility]   Stress    Stress: 0  Social Connections: Unknown (02/17/2023)   Social Connection and Isolation Panel    Frequency of Communication with Friends and Family: Three times a week    Frequency of Social Gatherings with Friends and Family: More than three times a week    Attends Religious Services: Patient unable to answer    Active Member of Clubs or Organizations: Not on file    Attends Banker Meetings: Patient unable to answer    Marital Status: Married  Catering Manager Violence: Not At Risk (01/31/2024)   Epic    Fear of Current or Ex-Partner: No    Emotionally Abused: No    Physically Abused: No    Sexually Abused: No  Depression (PHQ2-9): High Risk (01/10/2024)   Depression (PHQ2-9)    PHQ-2 Score: 15  Alcohol Screen: Not on file  Housing: Low Risk (01/31/2024)   Epic    Unable to Pay for Housing in the Last Year: No    Number of Times Moved in the Last Year: 0    Homeless in the Last Year: No  Utilities: Not At Risk (01/31/2024)   Epic    Threatened with loss of utilities: No  Health Literacy: Not on file    FAMILY HISTORY: Family History  Problem Relation Age of Onset   Stroke Mother    Anxiety disorder Mother    Drug abuse Mother    Alcohol abuse Mother    Depression Mother    Drug abuse Father    Anxiety disorder Father    Heart disease Maternal Grandmother    Colon cancer Neg Hx    Esophageal cancer Neg Hx    Pancreatic cancer Neg Hx    Stomach cancer Neg Hx    Liver disease Neg Hx     Breast cancer Neg Hx      PHYSICAL EXAMINATION: ECOG PERFORMANCE STATUS: 4 - Bedbound  Vitals:   01/31/24 0800 01/31/24 0808  BP: (!) 176/72   Pulse: 72   Resp: 17   Temp:    SpO2: 98% 99%   Filed Weights   01/31/24 0230  Weight: 266 lb 12.1 oz (121 kg)    GENERAL: alert, + moderate distress + obese SKIN: skin color, texture, turgor are normal, no rashes or significant lesions EYES: normal, conjunctiva are pink and non-injected, sclera clear OROPHARYNX: no exudate, no erythema and lips, buccal mucosa, and tongue normal  NECK: supple, thyroid  normal size, non-tender, without nodularity LYMPH: no palpable lymphadenopathy in the cervical, axillary or inguinal LUNGS: + Diminished to auscultation  HEART: regular rate & rhythm and no murmurs and no lower extremity edema ABDOMEN: abdomen soft, non-tender and normal bowel sounds MUSCULOSKELETAL: no cyanosis of digits and no clubbing  PSYCH: alert & oriented x 3 with fluent speech NEURO: no focal motor/sensory deficits   ALLERGIES:  is allergic to gadolinium derivatives and grapefruit flavor [flavoring agent (non-screening)].  MEDICATIONS:  Current Facility-Administered Medications  Medication Dose Route Frequency Provider Last Rate Last Admin   0.9 %  sodium chloride  infusion   Intravenous PRN Kakrakandy, Arshad N, MD 10 mL/hr at 01/31/24 0800 Infusion Verify at 01/31/24 0800   acetaminophen  (TYLENOL ) tablet 650 mg  650 mg Oral Q6H PRN Kakrakandy, Arshad N, MD   650 mg at 01/31/24 9248   Or   acetaminophen  (TYLENOL ) suppository 650 mg  650 mg Rectal Q6H PRN Franky Redia SAILOR, MD       amLODipine  (NORVASC ) tablet 10 mg  10 mg Oral Daily Franky Redia SAILOR, MD       carbamazepine  (TEGRETOL ) tablet 400 mg  400 mg Oral BID Franky Redia SAILOR, MD   400 mg at 01/31/24 0124   cefTRIAXone  (ROCEPHIN ) 1 g in sodium chloride  0.9 % 100 mL IVPB  1 g Intravenous Q24H Franky Redia SAILOR, MD   Stopped at 01/31/24 0102   Chlorhexidine   Gluconate Cloth 2 % PADS 6 each  6 each Topical Daily Franky Redia SAILOR, MD       furosemide  (LASIX ) tablet 80 mg  80 mg Oral Daily Franky Redia SAILOR, MD       gabapentin  (NEURONTIN ) capsule 600 mg  600 mg Oral BID Kakrakandy, Arshad N, MD       insulin  aspart (novoLOG ) injection 0-15 Units  0-15 Units Subcutaneous TID WC Franky Redia SAILOR, MD       ipratropium-albuterol  (DUONEB) 0.5-2.5 (3) MG/3ML nebulizer solution 3 mL  3 mL Nebulization Q2H PRN Franky Redia SAILOR, MD       ipratropium-albuterol  (DUONEB) 0.5-2.5 (3) MG/3ML nebulizer solution 3 mL  3 mL Nebulization Q6H Kakrakandy, Arshad N, MD   3 mL at 01/31/24 9191   lisinopril  (ZESTRIL ) tablet 30 mg  30 mg Oral Daily Franky Redia SAILOR, MD       methylPREDNISolone  sodium succinate (SOLU-MEDROL ) 40 mg/mL injection 40 mg  40 mg Intravenous Q12H Kakrakandy, Arshad N, MD   40 mg at 01/31/24 0530   Oral care mouth rinse  15 mL Mouth Rinse 4 times per day Franky Redia SAILOR, MD   15 mL at 01/31/24 0800   Oral care mouth rinse  15 mL Mouth Rinse PRN Franky Redia SAILOR, MD       potassium chloride  SA (KLOR-CON  M) CR tablet 20 mEq  20 mEq Oral Daily Franky Redia SAILOR, MD         LABORATORY DATA:  I have reviewed the data as listed Lab Results  Component Value Date   WBC 8.1 01/31/2024   HGB 13.1 01/31/2024   HCT 38.9 01/31/2024   MCV 89.2 01/31/2024   PLT 371 01/31/2024   Recent Labs  01/10/24 1016 01/30/24 2055 01/31/24 0724  NA 139 135 135  K 3.9 3.4* 4.5  CL 98 96* 98  CO2 26 18* 25  GLUCOSE 103* 103* 127*  BUN 7 7 6   CREATININE 0.59 0.70 0.60  CALCIUM  9.7 9.5 10.0  GFRNONAA >60 >60 >60  PROT 7.3 7.0 7.1  ALBUMIN 4.5 4.3 4.4  AST 19 17 15   ALT 14 11 9   ALKPHOS 160* 148* 147*  BILITOT 0.3 <0.2 <0.2  BILIDIR  --  <0.1  --   IBILI  --  NOT CALCULATED  --     RADIOGRAPHIC STUDIES: I have personally reviewed the radiological images as listed and agreed with the findings in the report. CT CHEST  ABDOMEN PELVIS WO CONTRAST Result Date: 01/31/2024 EXAM: CT CHEST, ABDOMEN AND PELVIS WITHOUT CONTRAST 01/31/2024 01:39:32 AM TECHNIQUE: CT of the chest, abdomen and pelvis was performed without the administration of intravenous contrast. Multiplanar reformatted images are provided for review. Automated exposure control, iterative reconstruction, and/or weight based adjustment of the mA/kV was utilized to reduce the radiation dose to as low as reasonably achievable. COMPARISON: None available. CLINICAL HISTORY: Respiratory illness, nondiagnostic xray. FINDINGS: CHEST: MEDIASTINUM AND LYMPH NODES: Heart and pericardium are unremarkable. The central airways are clear. Prevascular window adenopathy along with right paratracheal and subcarinal adenopathy again noted, unchanged. No axillary adenopathy. Difficult to assess the hila without intravenous contrast. LUNGS AND PLEURA: Posterior left upper lobe nodule measures 2.1 x 1.9 cm compared to 1.9 cm maximally on prior study. Numerous bilateral 5 to 10 mm pulmonary nodules on prior study and compatible with metastatic disease. No focal consolidation or pulmonary edema. No pleural effusion. No pneumothorax. ABDOMEN AND PELVIS: LIVER: Unremarkable. GALLBLADDER AND BILE DUCTS: Unremarkable. No biliary ductal dilatation. SPLEEN: No acute abnormality. PANCREAS: No acute abnormality. ADRENAL GLANDS: No acute abnormality. KIDNEYS, URETERS AND BLADDER: No stones in the kidneys or ureters. No hydronephrosis. No perinephric or periureteral stranding. Urinary bladder is unremarkable. GI AND BOWEL: Stomach demonstrates no acute abnormality. Scattered colonic diverticulosis. No active diverticulitis. There is no bowel obstruction. REPRODUCTIVE ORGANS: Calcified fibroid in the uterine fundus, stable. No adnexal mass. PERITONEUM AND RETROPERITONEUM: Nodularity in the omentum and peritoneum, worsening since prior study. Conglomerate omental mass in the left lower quadrant measures up to  6.4 cm. Stable 3 cm water density cystic structure in the retroperitoneum adjacent to the distal aorta and IVC compatible with a benign process. No ascites. No free air. VASCULATURE: Aorta is normal in caliber. Scattered aortic atherosclerosis. ABDOMINAL AND PELVIS LYMPH NODES: No lymphadenopathy. BONES AND SOFT TISSUES: No acute osseous abnormality. No focal soft tissue abnormality. IMPRESSION: 1. Posterior left upper lobe nodule measures 2.1 x 1.9 cm, increased from 1.9 cm on prior study. This may represent the primary malignancy. 2. Numerous bilateral 5 to 10 mm pulmonary nodules, compatible with metastatic disease. 3. Mediastinal adenopathy, unchanged. 4. Nodularity in the omentum and peritoneum, worsening since prior study, with a conglomerate omental mass in the left lower quadrant measuring up to 6.4 cm compatible with metastatic disease. Electronically signed by: Franky Crease MD 01/31/2024 02:02 AM EST RP Workstation: [id number]   DG Chest Port 1 View Result Date: 01/30/2024 EXAM: 1 VIEW(S) XRAY OF THE CHEST 01/30/2024 09:23:00 PM COMPARISON: Portable chest 01/10/2024. CTA chest 11/30/2023. CLINICAL HISTORY: dyspnea FINDINGS: LINES, TUBES AND DEVICES: Overlying monitor leads. LUNGS AND PLEURA: There are numerous pulmonary nodules throughout the lung fields. Findings consistent with widespread pulmonary metastases. This was better seen on CT as well  as an approximately 3 cm left upper lobe presumed primary malignancy superimposing adjacent the aortic knob. There is soft tissue fullness along both hilar silhouettes and AP window, consistent with adenopathy. This was seen previously and may have slightly progressed. No focal pneumonia is evident. No pleural effusion. No pneumothorax. HEART AND MEDIASTINUM: Cardiomegaly is stable. No vascular congestion is seen. There is a mildly tortuous aorta with atherosclerosis. BONES AND SOFT TISSUES: No acute osseous abnormality. IMPRESSION: 1. Stable cardiomegaly without  vascular congestion or focal pneumonia. 2. Widespread pulmonary metastases with approximately 3 cm left upper lobe presumed primary malignancy partially visible, and hilar/AP window adenopathy possibly slightly progressed. Electronically signed by: Francis Quam MD 01/30/2024 09:34 PM EST RP Workstation: [id number]   DG Chest Port 1 View Result Date: 01/10/2024 CLINICAL DATA:  Shortness of breath, chest pain EXAM: PORTABLE CHEST 1 VIEW COMPARISON:  November 30, 2023 FINDINGS: The heart size and mediastinal contours are within normal limits. Both lungs are clear. The visualized skeletal structures are unremarkable. IMPRESSION: No active disease. Electronically Signed   By: Lynwood Landy Raddle M.D.   On: 01/10/2024 10:34     I personally spent a total of 55 minutes minutes in the care of the patient today including preparing to see the patient, getting/reviewing separately obtained history, performing a medically appropriate exam/evaluation, counseling and educating, referring and communicating with other health care professionals, documenting clinical information in the EHR, communicating results, and coordinating care.    All questions were answered. The patient knows to call the clinic with any problems, questions or concerns. No barriers to learning was detected.  Olam JINNY Brunner, NP 1/6/20269:16 AM   ADDENDUM: Hematology/Oncology Attending: The patient is seen and examined today.  I agree with the above note.  This is a very pleasant 54 years old African-American female with long history of smoking and also history of drug abuse.  She has a history of COPD exacerbation that was managed in November 2025.  During her evaluation at that time she has a spiculated nodule in the right lung with other multiple small pulmonary nodules.  She was supposed to have bronchoscopy on outpatient basis but she missed her appointment 3 times because of transportation issues.  She was admitted to the hospital recently  with significant shortness of breath.  She had repeat CT scan of the chest, abdomen and pelvis performed earlier today and that showed posterior left upper lobe nodule measuring 2.1 x 1.9 cm with numerous bilateral 0.5 to 1.0 cm pulmonary nodules compatible with metastatic disease.  The scan of the abdomen showed nodularity in the omentum and peritoneum worsening since the prior study and there was a conglomerate omental mass in the left lower quadrant measuring up to 6.4 cm compatible with metastatic disease.  She also had unchanged mediastinal adenopathy.  We were consulted to evaluate the patient and give recommendation regarding her condition. When seen today she continues to have the shortness of breath and she is currently on BiPAP for breathing.  She was able to communicate and give her history under the circumstances. I had a lengthy discussion with the patient today about her condition.  The primary source of her malignancy is not clearly identified especially with the presence of her mental and peritoneal lesion.  I agree with proceeding with bronchoscopy with biopsy of the left upper lobe nodule.  If the patient cannot tolerate the bronchoscopy or if it was nonconclusive, we may consider her for CT-guided core biopsy of the omental mass.  I will arrange for the patient a follow-up appointment with me at the cancer center after discharge for further evaluation and recommendation regarding treatment of her condition based on the final pathology. Will check some of the tumor markers today to help identify the underlying malignancy. Thank you for allowing us  to participate in the care of Denise [name], will continue to follow-up the patient with you and assist in her management on as-needed basis. Disclaimer: This note was dictated with voice recognition software. Similar sounding words can inadvertently be transcribed and may be missed upon review. Sherrod MARLA Sherrod, MD

## 2024-01-31 NOTE — Telephone Encounter (Signed)
 Appt was cancelled. Pt cancelled bronch so f/u appt was not needed.  Copied from CRM [id number]. Topic: Appointments - Scheduling Inquiry for Clinic >> Jan 30, 2024 10:43 AM Rozanna MATSU wrote: Reason for CRM: pt called stated she is suppose to see Sarah on 01/06 and wants to resch. >> Jan 31, 2024  8:49 AM Cranford J wrote: Pt is hospitalized

## 2024-01-31 NOTE — Plan of Care (Signed)
  Problem: Education: Goal: Ability to describe self-care measures that may prevent or decrease complications (Diabetes Survival Skills Education) will improve Outcome: Progressing   Problem: Coping: Goal: Ability to adjust to condition or change in health will improve Outcome: Progressing   Problem: Fluid Volume: Goal: Ability to maintain a balanced intake and output will improve Outcome: Progressing   Problem: Health Behavior/Discharge Planning: Goal: Ability to identify and utilize available resources and services will improve Outcome: Progressing Goal: Ability to manage health-related needs will improve Outcome: Progressing   Problem: Metabolic: Goal: Ability to maintain appropriate glucose levels will improve Outcome: Progressing   Problem: Nutritional: Goal: Maintenance of adequate nutrition will improve Outcome: Progressing Goal: Progress toward achieving an optimal weight will improve Outcome: Progressing   Problem: Skin Integrity: Goal: Risk for impaired skin integrity will decrease Outcome: Progressing   Problem: Tissue Perfusion: Goal: Adequacy of tissue perfusion will improve Outcome: Progressing   Problem: Education: Goal: Knowledge of General Education information will improve Description: Including pain rating scale, medication(s)/side effects and non-pharmacologic comfort measures Outcome: Progressing

## 2024-01-31 NOTE — Progress Notes (Signed)
" °   01/31/24 2035  BiPAP/CPAP/SIPAP  BiPAP/CPAP/SIPAP Pt Type Adult  BiPAP/CPAP/SIPAP Resmed  Mask Type Full face mask  Dentures removed? Not applicable  Mask Size Medium  Respiratory Rate 19 breaths/min  Pressure Support 5 cmH20  FiO2 (%) 21 %  Patient Home Machine No  Patient Home Mask No  Patient Home Tubing No  Auto Titrate Yes  Minimum cmH2O 5 cmH2O  Maximum cmH2O 15 cmH2O  CPAP/SIPAP surface wiped down Yes  Device Plugged into RED Power Outlet Yes    "

## 2024-01-31 NOTE — Progress Notes (Signed)
 Pt removed from BiPAP at this time. VSS, patient states she is feeling better. RT will followup if needed.

## 2024-01-31 NOTE — Telephone Encounter (Signed)
 Canceled patient appointment will call to schedule hfu once patient Is discharged

## 2024-01-31 NOTE — H&P (Signed)
 " History and Physical    Denise [name] FMW:[id number] DOB: 07/08/1970 DOA: 01/30/2024  Patient coming from: Home.  Chief Complaint: Shortness of breath.  Rectal bleeding.  HPI: Denise [name] is a 54 y.o. female with history of COPD, sleep apnea, trigeminal neuralgia, chronic respiratory failure on 2 L oxygen, polysubstance abuse, prior stroke who was admitted in November 2025 for COPD exacerbation at that time patient also was found to have lung mass and was planned to do bronchoscopy as outpatient presents to the ER after patient was getting short of breath and rectal bleeding.  Patient states over the last few days she has been getting more short of breath.  She does use inhaler but does not have nebulizers at home.  Also last few days she has been having diffuse abdominal discomfort with some rectal bleeding.  She attributes the rectal bleeding to hemorrhoids.  EMS on arrival found patient to be hypoxic was placed on BiPAP and brought to the ER.  ED Course: In the ER patient was continued on BiPAP was found to be diffusely wheezing was placed on nebulizer steroids.  VBG showed pH of 7.42 pCO2 of 30.  Patient's labs showed WBC of 12 proBNP of less than 50 hemoglobin 13 chest x-ray showing widespread metastasis.  CT chest abdomen pelvis shows left upper lung nodule with widespread metastasis.  Patient admitted for acute respiratory failure with hypoxia with lung mass concerning for malignancy with metastasis.  Review of Systems: As per HPI, rest all negative.   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   Chronic diastolic CHF (congestive heart failure) 07/29/2021   Class 3 obesity with alveolar hypoventilation, serious comorbidity, and body mass index (BMI) of 50.0 to 59.9 in adult  06/12/2020   CVA (cerebrovascular accident) 2017   2017 MRI - chronic left lenticulostriate territory hemorrhage or hemorrhagic infarct, with hemosiderin, and encephalomalacia affecting both the caudate and lentiform nucleus     Decreased mobility 06/15/2021   DOE (dyspnea on exertion) 06/12/2020   Essential hypertension 12/18/2014   Fibroid uterus 12/18/2014   Heart murmur, systolic 06/29/2021   History of alcohol abuse 02/16/2017   History of cocaine use 02/16/2017   History of latent syphilis 02/16/2017   Hyperlipidemia associated with type 2 diabetes mellitus 11/07/2020   Incontinence of urine    Lacunar infarction    MRI - chronic lacunar infarct within the posterior right lentiform nucleus.     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   Low back pain 11/21/2017   Lumbar degenerative disc disease    Major depressive disorder 02/16/2017   Myocardial infarction (HCC)    2015 in [facility]   Nuclear sclerosis of both eyes 10/20/2022   OSA on CPAP 06/12/2020   Overactive bladder    Pain due to onychomycosis of toenails of both feet 05/26/2020   Pelvic pain in female 12/18/2014   Psychogenic nonepileptic seizure 02/17/2017   PTSD (post-traumatic stress disorder)    Pulmonary emphysema 08/31/2021   QT prolongation 06/29/2021   Right sided weakness    Social anxiety disorder 11/03/2017   Syncope 02/07/2015   TIA (transient ischemic attack)    Tobacco use 04/14/2015   Type 2 diabetes mellitus 10/21/2017   Venous stasis dermatitis of both lower extremities 06/15/2021    Past Surgical History:  Procedure  Laterality Date   CESAREAN SECTION     x 2     reports that she has been smoking cigarettes. She has never used smokeless tobacco. She reports current alcohol use of about 2.0 standard drinks of alcohol per week. She reports that she does not currently use drugs after having used the following drugs: Crack cocaine.  Allergies[1]  Family History  Problem Relation Age of Onset   Stroke  Mother    Anxiety disorder Mother    Drug abuse Mother    Alcohol abuse Mother    Depression Mother    Drug abuse Father    Anxiety disorder Father    Heart disease Maternal Grandmother    Colon cancer Neg Hx    Esophageal cancer Neg Hx    Pancreatic cancer Neg Hx    Stomach cancer Neg Hx    Liver disease Neg Hx    Breast cancer Neg Hx     Prior to Admission medications  Medication Sig Start Date End Date Taking? Authorizing Provider  amLODipine  (NORVASC ) 10 MG tablet Take 10 mg by mouth daily. 05/25/23   [provider]  atorvastatin  (LIPITOR) 20 MG tablet Take 1 tablet (20 mg total) by mouth daily. Pt needs to keep upcoming appt in July for further refills Patient not taking: Reported on 01/10/2024 07/20/23   Santo Stanly LABOR, MD  BA[facility]ER BACK & BODY PAIN EX ST 500-32.5 MG TABS Take 1-2 tablets by mouth every 6 (six) hours as needed (for pain).    [provider]  budesonide -glycopyrrolate -formoterol  (BREZTRI  AEROSPHERE) 160-9-4.8 MCG/ACT AERO inhaler Inhale 2 puffs into the lungs in the morning and at bedtime. 10/11/23   Cobb, Comer GAILS, NP  buPROPion  (WELLBUTRIN  SR) 150 MG 12 hr tablet Take 1 tablet (150 mg total) by mouth 2 (two) times daily. 01/11/24   White, Patrice L, NP  carbamazepine  (TEGRETOL ) 200 MG tablet Take 2 tablets (400 mg total) by mouth 2 (two) times daily. 12/15/23   Skeet Juliene SAUNDERS, DO  diphenhydrAMINE -PE-APAP (MUCINEX  FAST-MAX NGHT COLD/FLU) 12.5-5-325 MG TABS Take 1 tablet by mouth every 6 (six) hours as needed (for flu-like symptoms).    [provider]  furosemide  (LASIX ) 80 MG tablet TAKE 1 TABLET BY MOUTH EVERY DAY 11/10/23   Chandrasekhar, Mahesh A, MD  gabapentin  (NEURONTIN ) 300 MG capsule Take 2 capsules (600 mg total) by mouth 3 (three) times daily. 12/15/23   Skeet Juliene SAUNDERS, DO  guaiFENesin  (MUCINEX ) 600 MG 12 hr tablet Take 2 tablets (1,200 mg total) by mouth 2 (two) times daily. Patient not taking: Reported on 01/10/2024  12/02/23   Regalado, Owen A, MD  latanoprost  (XALATAN ) 0.005 % ophthalmic solution Place 1 drop into both eyes at bedtime. 09/19/23 09/18/24  [provider]  lisinopril  (ZESTRIL ) 30 MG tablet Take 30 mg by mouth daily. 03/29/23   [provider]  metFORMIN  (GLUCOPHAGE ) 500 MG tablet Take 500 mg by mouth See admin instructions. Take 500 mg by mouth with breakfast and supper 05/25/17   [provider]  nicotine  (NICODERM CQ  - DOSED IN MG/24 HOURS) 14 mg/24hr patch RX #2 Weeks 5-6: 14 mg x 1 patch daily. Wear for 24 hours. If you have sleep disturbances, remove at bedtime. 12/02/23   Regalado, Belkys A, MD  Nicotine  (NICODERM CQ  TD) Place 1 patch onto the skin daily as needed (for smoking cessation).    [provider]  Olopatadine  HCl 0.2 % SOLN Place 1 drop into both eyes daily. 09/19/23  [provider]  OXYGEN Inhale 2 L/min into the lungs continuous.    [provider]  pantoprazole  (PROTONIX ) 40 MG tablet Take 1 tablet (40 mg total) by mouth daily. 12/02/23 01/10/24  Regalado, Belkys A, MD  Polyethyl Glycol-Propyl Glycol (SYSTANE ULTRA) 0.4-0.3 % SOLN Apply 1 drop to eye. 09/19/23   [provider]  potassium chloride  SA (KLOR-CON  M) 20 MEQ tablet Take 1 tablet (20 mEq total) by mouth daily. 08/01/21   Jillian Buttery, MD  PRESCRIPTION MEDICATION CPAP - At bedtime and during any time of rest    [provider]  senna-docusate (SENOKOT-S) 8.6-50 MG tablet Take 1 tablet by mouth at bedtime as needed for mild constipation. Patient not taking: Reported on 01/10/2024 02/20/23   Cherlyn Labella, MD  VENTOLIN  HFA 108 (90 Base) MCG/ACT inhaler inhale 2 PUFFS into THE lungs EVERY 6 HOURS AS NEEDED FOR WHEEZING OR SHORTNESS OF BREATH 02/15/22   Shellia Oh, MD    Physical Exam: Constitutional: Moderately built and nourished. Vitals:   01/30/24 2045 01/30/24 2058 01/30/24 2100 01/30/24 2300  BP: (!) 144/78  136/75 (!) 133/95  Pulse: 78  76 74   Resp: (!) 21  16 (!) 28  Temp:      TempSrc:      SpO2: 100% 100% 100% 100%   Eyes: Anicteric no pallor. ENMT: No discharge from the ears eyes nose or mouth. Neck: No mass felt.  No neck rigidity. Respiratory: Bilateral expiratory wheeze and no crepitations. Cardiovascular: S1-S2 heard. Abdomen: Mild tenderness diffusely.  No guarding or rigidity. Musculoskeletal: No edema. Skin: No rash. Neurologic: Alert awake oriented to time place and person.  Moves all extremities. Psychiatric: Appears normal.  Normal affect.   Labs on Admission: I have personally reviewed following labs and imaging studies  CBC: Recent Labs  Lab 01/30/24 2055  WBC 12.0*  NEUTROABS 6.8  HGB 13.0  HCT 38.3  MCV 88.7  PLT 342   Basic Metabolic Panel: Recent Labs  Lab 01/30/24 2055  NA 135  K 3.4*  CL 96*  CO2 18*  GLUCOSE 103*  BUN 7  CREATININE 0.70  CALCIUM  9.5  MG 1.6*   GFR: Estimated Creatinine Clearance: 94.8 mL/min (by C-G formula based on SCr of 0.7 mg/dL). Liver Function Tests: Recent Labs  Lab 01/30/24 2055  AST 17  ALT 11  ALKPHOS 148*  BILITOT <0.2  PROT 7.0  ALBUMIN 4.3   No results for input(s): LIPASE, AMYLASE in the last 168 hours. No results for input(s): AMMONIA in the last 168 hours. Coagulation Profile: No results for input(s): INR, PROTIME in the last 168 hours. Cardiac Enzymes: No results for input(s): CKTOTAL, CKMB, CKMBINDEX, TROPONINI in the last 168 hours. BNP (last 3 results) Recent Labs    11/30/23 0908 01/30/24 2055  PROBNP <50.0 <50.0   HbA1C: No results for input(s): HGBA1C in the last 72 hours. CBG: No results for input(s): GLUCAP in the last 168 hours. Lipid Profile: No results for input(s): CHOL, HDL, LDLCALC, TRIG, CHOLHDL, LDLDIRECT in the last 72 hours. Thyroid  Function Tests: No results for input(s): TSH, T4TOTAL, FREET4, T3FREE, THYROIDAB in the last 72 hours. Anemia Panel: No results  for input(s): VITAMINB12, FOLATE, FERRITIN, TIBC, IRON, RETICCTPCT in the last 72 hours. Urine analysis:    Component Value Date/Time   COLORURINE STRAW (A) 11/30/2023 1239   APPEARANCEUR CLEAR 11/30/2023 1239   LABSPEC 1.017 11/30/2023 1239   PHURINE 5.0 11/30/2023 1239   GLUCOSEU NEGATIVE 11/30/2023 1239  HGBUR NEGATIVE 11/30/2023 1239   BILIRUBINUR NEGATIVE 11/30/2023 1239   KETONESUR NEGATIVE 11/30/2023 1239   PROTEINUR NEGATIVE 11/30/2023 1239   UROBILINOGEN 0.2 11/22/2014 0240   NITRITE NEGATIVE 11/30/2023 1239   LEUKOCYTESUR NEGATIVE 11/30/2023 1239   Sepsis Labs: @LABRCNTIP (procalcitonin:4,lacticidven:4) ) Recent Results (from the past 240 hours)  Resp panel by RT-PCR (RSV, Flu A&B, Covid) Anterior Nasal Swab     Status: None   Collection Time: 01/30/24  8:55 PM   Specimen: Anterior Nasal Swab  Result Value Ref Range Status   SARS Coronavirus 2 by RT PCR NEGATIVE NEGATIVE Final    Comment: (NOTE) SARS-CoV-2 target nucleic acids are NOT DETECTED.  The SARS-CoV-2 RNA is generally detectable in upper respiratory specimens during the acute phase of infection. The lowest concentration of SARS-CoV-2 viral copies this assay can detect is 138 copies/mL. A negative result does not preclude SARS-Cov-2 infection and should not be used as the sole basis for treatment or other patient management decisions. A negative result may occur with  improper specimen collection/handling, submission of specimen other than nasopharyngeal swab, presence of viral mutation(s) within the areas targeted by this assay, and inadequate number of viral copies(<138 copies/mL). A negative result must be combined with clinical observations, patient history, and epidemiological information. The expected result is Negative.  Fact Sheet for Patients:  bloggercourse.com  Fact Sheet for Healthcare Providers:  seriousbroker.it  This test is  no t yet approved or cleared by the [place]  FDA and  has been authorized for detection and/or diagnosis of SARS-CoV-2 by FDA under an Emergency Use Authorization (EUA). This EUA will remain  in effect (meaning this test can be used) for the duration of the COVID-19 declaration under Section 564(b)(1) of the Act, 21 U.S.C.section 360bbb-3(b)(1), unless the authorization is terminated  or revoked sooner.       Influenza A by PCR NEGATIVE NEGATIVE Final   Influenza B by PCR NEGATIVE NEGATIVE Final    Comment: (NOTE) The Xpert Xpress SARS-CoV-2/FLU/RSV plus assay is intended as an aid in the diagnosis of influenza from Nasopharyngeal swab specimens and should not be used as a sole basis for treatment. Nasal washings and aspirates are unacceptable for Xpert Xpress SARS-CoV-2/FLU/RSV testing.  Fact Sheet for Patients: bloggercourse.com  Fact Sheet for Healthcare Providers: seriousbroker.it  This test is not yet approved or cleared by the [place]  FDA and has been authorized for detection and/or diagnosis of SARS-CoV-2 by FDA under an Emergency Use Authorization (EUA). This EUA will remain in effect (meaning this test can be used) for the duration of the COVID-19 declaration under Section 564(b)(1) of the Act, 21 U.S.C. section 360bbb-3(b)(1), unless the authorization is terminated or revoked.     Resp Syncytial Virus by PCR NEGATIVE NEGATIVE Final    Comment: (NOTE) Fact Sheet for Patients: bloggercourse.com  Fact Sheet for Healthcare Providers: seriousbroker.it  This test is not yet approved or cleared by the [place]  FDA and has been authorized for detection and/or diagnosis of SARS-CoV-2 by FDA under an Emergency Use Authorization (EUA). This EUA will remain in effect (meaning this test can be used) for the duration of the COVID-19 declaration under Section  564(b)(1) of the Act, 21 U.S.C. section 360bbb-3(b)(1), unless the authorization is terminated or revoked.  Performed at [facility], [address] [place], [place] [address]      Radiological Exams on Admission: DG Chest Port 1 View Result Date: 01/30/2024 EXAM: 1 VIEW(S) XRAY OF THE CHEST 01/30/2024 09:23:00 PM  COMPARISON: Portable chest 01/10/2024. CTA chest 11/30/2023. CLINICAL HISTORY: dyspnea FINDINGS: LINES, TUBES AND DEVICES: Overlying monitor leads. LUNGS AND PLEURA: There are numerous pulmonary nodules throughout the lung fields. Findings consistent with widespread pulmonary metastases. This was better seen on CT as well as an approximately 3 cm left upper lobe presumed primary malignancy superimposing adjacent the aortic knob. There is soft tissue fullness along both hilar silhouettes and AP window, consistent with adenopathy. This was seen previously and may have slightly progressed. No focal pneumonia is evident. No pleural effusion. No pneumothorax. HEART AND MEDIASTINUM: Cardiomegaly is stable. No vascular congestion is seen. There is a mildly tortuous aorta with atherosclerosis. BONES AND SOFT TISSUES: No acute osseous abnormality. IMPRESSION: 1. Stable cardiomegaly without vascular congestion or focal pneumonia. 2. Widespread pulmonary metastases with approximately 3 cm left upper lobe presumed primary malignancy partially visible, and hilar/AP window adenopathy possibly slightly progressed. Electronically signed by: Francis Quam MD 01/30/2024 09:34 PM EST RP Workstation: [id number]    EKG: Independently reviewed.  Normal sinus rhythm.  Assessment/Plan Principal Problem:   Acute respiratory failure with hypoxia (HCC) Active Problems:   Type 2 diabetes mellitus   Essential hypertension   OSA on CPAP   CVA (cerebrovascular accident)   History of cocaine use   COPD with acute exacerbation (HCC)   Metabolic acidosis    Acute respiratory failure with  hypoxia presently on BiPAP secondary to COPD exacerbation for which patient has been placed on scheduled and as needed nebulizers IV steroids and antibiotics. Lung mass with widespread metastasis.  Will consult pulmonary and also oncology.  Looks like patient canceled the bronchoscopy appointments per the chart. History of trigeminal neuralgia on Tegretol .  Patient's gabapentin  dose has been reduced to twice daily by neurology since patient was having some myoclonus. Diabetes mellitus type 2 presently on sliding scale coverage.  Takes metformin  at home.  Last hemoglobin A1c was 6.2 about 7 months ago. Rectal bleeding will follow CBC for now.  If there is significant drop in hemoglobin will consult GI. Cognitive impairment. Sleep apnea on CPAP at bedtime presently on BiPAP. Hypertension on amlodipine  lisinopril  and Lasix .  Last 2D echo done in December 2024 showed EF of 60 to 65%.  Since patient has respiratory failure requiring BiPAP and also widespread metastatic lesions will need further workup and more than 2 midnight stay.  DVT prophylaxis: SCDs. Code Status: Full code. Family Communication: Unable to reach family. Disposition Plan: Stepdown. Consults called: Pulmonary.  Oncology. Admission status: Inpatient.         [1]  Allergies Allergen Reactions   Gadolinium Derivatives Nausea And Vomiting and Other (See Comments)    Nausea and vomiting was followed by epileptic seizure episode that lasted approximately 5 minutes; pt was unable to verbally communicate during that time; she then came to and was able to speak; was evaluated by Rad and RN; kms    Grapefruit Flavor [Flavoring Agent (Non-Screening)] Other (See Comments)    Drug interaction   "

## 2024-01-31 NOTE — Plan of Care (Signed)
" °  Problem: Coping: Goal: Ability to adjust to condition or change in health will improve Outcome: Not Progressing   Problem: Health Behavior/Discharge Planning: Goal: Ability to manage health-related needs will improve Outcome: Not Progressing   Problem: Activity: Goal: Risk for activity intolerance will decrease Outcome: Not Progressing   Problem: Nutrition: Goal: Adequate nutrition will be maintained Outcome: Not Progressing   Problem: Coping: Goal: Level of anxiety will decrease Outcome: Not Progressing   "

## 2024-01-31 NOTE — Consult Note (Signed)
 "  NAME:  Denise [name], MRN:  [id number], DOB:  1970-06-24, LOS: 0 ADMISSION DATE:  01/30/2024, CONSULTATION DATE:  01/31/24 REFERRING MD:  Odell Castor , CHIEF COMPLAINT:  SOB   History of Present Illness:  54 yo chronic hypoxic resp failure on home O2, OSA/OHS on CPAP, tobacco use, COPD, with known enlarging L lung mass + pulm nodules was admitted to [facility] 01/31/24 after presenting to ED w SOB. In chart review looks like she had some associated abd discomfort and recal bleeding.  She was placed on BiPAP and PCCM was consulted for SOB on BiPAP   In review of ED imaging, she has enlarging LUL mass + numeorus pulm nodules + omental mass. In chart review it looks like there have been several attempts at coordinating a bronch but all were cancelled. In d/w pt she sites lack of transportation.   Pertinent  Medical History  Tobacco use Chronic hypoxia OSA OHS COPD Obesity Lung nodules  [place]  Substance use disorder  CVA  MI PTSD  CHF   Significant Hospital Events: Including procedures, antibiotic start and stop dates in addition to other pertinent events   1/6 admit to [facility]. PCCM consult   Interim History / Subjective:  Not on BiPAP   Objective    Blood pressure (!) 159/61, pulse 80, temperature 98.6 F (37 C), temperature source Axillary, resp. rate 14, height 5' 5 (1.651 m), weight 121 kg, last menstrual period 05/29/2017, SpO2 95%.    FiO2 (%):  [21 %-30 %] 21 %   Intake/Output Summary (Last 24 hours) at 01/31/2024 1108 Last data filed at 01/31/2024 0800 Gross per 24 hour  Intake 763.95 ml  Output 150 ml  Net 613.95 ml   Filed Weights   01/31/24 0230  Weight: 121 kg    Examination: General: obese middle aged F NAD  HENT: NCAT pink mm  Lungs: faint wheeze  Cardiovascular: rr  Abdomen: soft round  Extremities: no obvious acute joint deformity  Neuro: Awake alert  GU: defer  Resolved problem list   Assessment and Plan   AoC resp failure w hypoxia, on home O2    OSA/OHS on CPAP  COPD Tobacco use  LUL pulm mass. Numerous pulm nodules. Omental mass  -very concerning for metastatic cancer.  -several outpt bronchs were scheduled, subsequently cancelled  P -PRN and at bedtime NPPV. Uses noct CPAP at home. Supplemental O2 when off BoiPAP -cont steroids -adding triple therapy nebs -with recurrent outpt cancellations, likely best to pursue bronch inpt when exacerbation has further improved. Will d/w my attending re scheduling options     Labs   CBC: Recent Labs  Lab 01/30/24 2055 01/31/24 0724  WBC 12.0* 8.1  NEUTROABS 6.8  --   HGB 13.0 13.1  HCT 38.3 38.9  MCV 88.7 89.2  PLT 342 371    Basic Metabolic Panel: Recent Labs  Lab 01/30/24 2055 01/31/24 0724  NA 135 135  K 3.4* 4.5  CL 96* 98  CO2 18* 25  GLUCOSE 103* 127*  BUN 7 6  CREATININE 0.70 0.60  CALCIUM  9.5 10.0  MG 1.6*  --    GFR: Estimated Creatinine Clearance: 103.6 mL/min (by C-G formula based on SCr of 0.6 mg/dL). Recent Labs  Lab 01/30/24 2055 01/31/24 0724  WBC 12.0* 8.1    Liver Function Tests: Recent Labs  Lab 01/30/24 2055 01/31/24 0724  AST 17 15  ALT 11 9  ALKPHOS 148* 147*  BILITOT <0.2 <0.2  PROT 7.0 7.1  ALBUMIN 4.3 4.4   No results for input(s): LIPASE, AMYLASE in the last 168 hours. No results for input(s): AMMONIA in the last 168 hours.  ABG    Component Value Date/Time   HCO3 19.5 (L) 01/30/2024 2055   TCO2 32 11/30/2023 1011   ACIDBASEDEF 3.9 (H) 01/30/2024 2055   O2SAT 93.3 01/30/2024 2055     Coagulation Profile: No results for input(s): INR, PROTIME in the last 168 hours.  Cardiac Enzymes: No results for input(s): CKTOTAL, CKMB, CKMBINDEX, TROPONINI in the last 168 hours.  HbA1C: Hgb A1c MFr Bld  Date/Time Value Ref Range Status  06/25/2023 05:39 AM 6.2 (H) 4.8 - 5.6 % Final    Comment:    (NOTE) Diagnosis of Diabetes The following HbA1c ranges recommended by the American Diabetes Association  (ADA) may be used as an aid in the diagnosis of diabetes mellitus.  Hemoglobin             Suggested A1C NGSP%              Diagnosis  <5.7                   Non Diabetic  5.7-6.4                Pre-Diabetic  >6.4                   Diabetic  <7.0                   Glycemic control for                       adults with diabetes.    02/18/2023 04:17 AM 6.3 (H) 4.8 - 5.6 % Final    Comment:    (NOTE) Pre diabetes:          5.7%-6.4%  Diabetes:              >6.4%  Glycemic control for   <7.0% adults with diabetes     CBG: Recent Labs  Lab 01/31/24 0726  GLUCAP 119*    Review of Systems:   Review of Systems  Constitutional:  Positive for malaise/fatigue.  Respiratory:  Positive for shortness of breath and wheezing. Negative for sputum production.      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), Chronic diastolic CHF (congestive heart failure) (07/29/2021), Class 3 obesity with alveolar hypoventilation, serious comorbidity, and body mass index (BMI) of 50.0 to 59.9 in adult (06/12/2020), CVA (cerebrovascular accident) (2017), Decreased mobility (06/15/2021), DOE (dyspnea on exertion) (06/12/2020), Essential hypertension (12/18/2014), Fibroid uterus (12/18/2014), Heart murmur, systolic (06/29/2021), History of alcohol abuse (02/16/2017), History of cocaine use (02/16/2017), History of latent syphilis (02/16/2017), Hyperlipidemia associated with type 2 diabetes mellitus (11/07/2020), Incontinence of urine, Lacunar infarction, Lithium  toxicity (09/25/2019), Low back pain (11/21/2017), Lumbar degenerative disc disease, Major depressive disorder (02/16/2017), Myocardial infarction [facility]), 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  Surgical History:   Past Surgical History:  Procedure Laterality Date   CESAREAN SECTION     x 2     Social History:   reports that she has been smoking cigarettes. She has never used smokeless tobacco. She reports current alcohol use of about 2.0 standard drinks of alcohol per week. She reports that she does not currently use drugs after having used the following drugs: Crack cocaine.   Family History:  Her family history includes Alcohol abuse in her mother; Anxiety disorder in her father and mother; Depression in her mother; Drug abuse in her father and mother; Heart disease in her maternal grandmother; Stroke in her mother. There is no history of Colon cancer, Esophageal cancer, Pancreatic cancer, Stomach cancer, Liver disease, or Breast cancer.   Allergies Allergies[1]   Home Medications  Prior to Admission medications  Medication Sig Start Date End Date Taking? Authorizing Provider  amLODipine  (NORVASC ) 10 MG tablet Take 10 mg by mouth daily. 05/25/23   [provider]  atorvastatin  (LIPITOR) 20 MG tablet Take 1 tablet (20 mg total) by mouth daily. Pt needs to keep upcoming appt in July for further refills Patient not taking: Reported on 01/10/2024 07/20/23   Santo Stanly LABOR, MD  [facility] BACK & BODY PAIN EX ST 500-32.5 MG TABS Take 1-2 tablets by mouth every 6 (six) hours as needed (for pain).    [provider]  budesonide -glycopyrrolate -formoterol  (BREZTRI   AEROSPHERE) 160-9-4.8 MCG/ACT AERO inhaler Inhale 2 puffs into the lungs in the morning and at bedtime. 10/11/23   Cobb, Comer GAILS, NP  buPROPion  (WELLBUTRIN  SR) 150 MG 12 hr tablet Take 1 tablet (150 mg total) by mouth 2 (two) times daily. 01/11/24   White, Patrice L, NP  carbamazepine  (TEGRETOL ) 200 MG tablet Take 2 tablets (400 mg total) by mouth 2 (two) times daily. 12/15/23   Skeet Juliene SAUNDERS, DO  diphenhydrAMINE -PE-APAP (MUCINEX  FAST-MAX NGHT COLD/FLU) 12.5-5-325 MG TABS Take 1 tablet by mouth every 6 (six) hours as needed (for flu-like symptoms).    [provider]  furosemide  (LASIX ) 80 MG tablet TAKE 1 TABLET BY MOUTH EVERY DAY 11/10/23   Chandrasekhar, Mahesh A, MD  gabapentin  (NEURONTIN ) 300 MG capsule Take 2 capsules (600 mg total) by mouth 3 (three) times daily. 12/15/23   Skeet Juliene SAUNDERS, DO  guaiFENesin  (MUCINEX ) 600 MG 12 hr tablet Take 2 tablets (1,200 mg total) by mouth 2 (two) times daily. Patient not taking: Reported on 01/10/2024 12/02/23   Regalado, Owen A, MD  latanoprost  (XALATAN ) 0.005 % ophthalmic solution Place 1 drop into both eyes at bedtime. 09/19/23 09/18/24  [provider]  lisinopril  (ZESTRIL ) 30 MG tablet Take 30 mg by mouth daily. 03/29/23   [provider]  metFORMIN  (GLUCOPHAGE ) 500 MG tablet Take 500 mg by mouth See admin instructions. Take 500 mg by mouth with breakfast and supper 05/25/17   [provider]  nicotine  (NICODERM CQ  - DOSED IN MG/24 HOURS) 14 mg/24hr patch RX #2 Weeks 5-6: 14 mg x 1 patch daily. Wear for 24 hours. If you have sleep disturbances, remove at bedtime. 12/02/23   Regalado, Belkys A, MD  Nicotine  (NICODERM CQ  TD) Place 1 patch onto the skin daily as needed (for smoking cessation).    [provider]  Olopatadine  HCl 0.2 % SOLN Place 1 drop into both eyes daily. 09/19/23   [provider]  OXYGEN Inhale 2 L/min into the lungs continuous.    [provider]  pantoprazole  (PROTONIX )  40 MG  tablet Take 1 tablet (40 mg total) by mouth daily. 12/02/23 01/10/24  Regalado, Belkys A, MD  Polyethyl Glycol-Propyl Glycol (SYSTANE ULTRA) 0.4-0.3 % SOLN Apply 1 drop to eye. 09/19/23   [provider]  potassium chloride  SA (KLOR-CON  M) 20 MEQ tablet Take 1 tablet (20 mEq total) by mouth daily. 08/01/21   Jillian Buttery, MD  PRESCRIPTION MEDICATION CPAP - At bedtime and during any time of rest    [provider]  senna-docusate (SENOKOT-S) 8.6-50 MG tablet Take 1 tablet by mouth at bedtime as needed for mild constipation. Patient not taking: Reported on 01/10/2024 02/20/23   Cherlyn Labella, MD  VENTOLIN  HFA 108 (90 Base) MCG/ACT inhaler inhale 2 PUFFS into THE lungs EVERY 6 HOURS AS NEEDED FOR WHEEZING OR SHORTNESS OF BREATH 02/15/22   Shellia Oh, MD     Critical care time: na      Ronnald Gave MSN, AGACNP-BC [facility] Amion for pager  01/31/2024, 11:08 AM          [1]  Allergies Allergen Reactions   Gadolinium Derivatives Nausea And Vomiting and Other (See Comments)    Nausea and vomiting was followed by epileptic seizure episode that lasted approximately 5 minutes; pt was unable to verbally communicate during that time; she then came to and was able to speak; was evaluated by Rad and RN; kms    Grapefruit Flavor [Flavoring Agent (Non-Screening)] Other (See Comments)    Drug interaction   "

## 2024-01-31 NOTE — Telephone Encounter (Signed)
 Pt was admitted yesterday for COPD exacerbation.

## 2024-01-31 NOTE — Progress Notes (Signed)
" °   01/31/24 0432  BiPAP/CPAP/SIPAP  $ Non-Invasive Home Ventilator  Initial  $ Face Mask Medium Yes  BiPAP/CPAP/SIPAP Pt Type Adult  BiPAP/CPAP/SIPAP Resmed  Mask Type Full face mask  Dentures removed? Not applicable  Mask Size Medium  Set Rate 0 breaths/min  Respiratory Rate 22 breaths/min  FiO2 (%) 21 %  Patient Home Machine No  Patient Home Mask No  Patient Home Tubing No  Auto Titrate Yes  Minimum cmH2O 5 cmH2O  Maximum cmH2O 15 cmH2O  CPAP/SIPAP surface wiped down Yes  Device Plugged into RED Power Outlet Yes    "

## 2024-02-01 DIAGNOSIS — R918 Other nonspecific abnormal finding of lung field: Secondary | ICD-10-CM | POA: Diagnosis not present

## 2024-02-01 DIAGNOSIS — Z720 Tobacco use: Secondary | ICD-10-CM

## 2024-02-01 DIAGNOSIS — J9621 Acute and chronic respiratory failure with hypoxia: Principal | ICD-10-CM

## 2024-02-01 DIAGNOSIS — J449 Chronic obstructive pulmonary disease, unspecified: Secondary | ICD-10-CM | POA: Diagnosis not present

## 2024-02-01 DIAGNOSIS — J9601 Acute respiratory failure with hypoxia: Secondary | ICD-10-CM | POA: Diagnosis not present

## 2024-02-01 LAB — CEA: CEA: 2.9 ng/mL (ref 0.0–4.7)

## 2024-02-01 LAB — CA 125: Cancer Antigen (CA) 125: 15.6 U/mL (ref 0.0–38.1)

## 2024-02-01 LAB — GLUCOSE, CAPILLARY
Glucose-Capillary: 124 mg/dL — ABNORMAL HIGH (ref 70–99)
Glucose-Capillary: 137 mg/dL — ABNORMAL HIGH (ref 70–99)
Glucose-Capillary: 101 mg/dL — ABNORMAL HIGH (ref 70–99)
Glucose-Capillary: 132 mg/dL — ABNORMAL HIGH (ref 70–99)

## 2024-02-01 LAB — CANCER ANTIGEN 19-9: CA 19-9: 9 U/mL (ref 0–35)

## 2024-02-01 MED ORDER — METHYLPREDNISOLONE SODIUM SUCC 40 MG IJ SOLR
40.0000 mg | Freq: Every day | INTRAMUSCULAR | Status: DC
  Administered 2024-02-02 – 2024-02-03 (×2): 40 mg via INTRAVENOUS
  Filled 2024-02-01 (×2): qty 1

## 2024-02-01 MED ORDER — NICOTINE 14 MG/24HR TD PT24
14.0000 mg | MEDICATED_PATCH | Freq: Every day | TRANSDERMAL | Status: DC
  Filled 2024-02-01 (×2): qty 1

## 2024-02-01 MED ORDER — SODIUM CHLORIDE 0.9 % IV SOLN
2.0000 g | INTRAVENOUS | Status: DC
  Administered 2024-02-01 – 2024-02-02 (×2): 2 g via INTRAVENOUS
  Filled 2024-02-01 (×2): qty 20

## 2024-02-01 NOTE — Progress Notes (Signed)
" ° °  NAME:  Denise [name], MRN:  [id number], DOB:  1970/08/31, LOS: 1 ADMISSION DATE:  01/30/2024, CONSULTATION DATE:  01/31/24 REFERRING MD:  Odell Castor , CHIEF COMPLAINT:  SOB   History of Present Illness:  54 yo chronic hypoxic resp failure on home O2, OSA/OHS on CPAP, tobacco use, COPD, with known enlarging L lung mass + pulm nodules was admitted to [facility] 01/31/24 after presenting to ED w SOB. In chart review looks like she had some associated abd discomfort and recal bleeding.  She was placed on BiPAP and PCCM was consulted for SOB on BiPAP   In review of ED imaging, she has enlarging LUL mass + numeorus pulm nodules + omental mass. In chart review it looks like there have been several attempts at coordinating a bronch but all were cancelled. In d/w pt she sites lack of transportation.   Pertinent  Medical History  Tobacco use Chronic hypoxia OSA OHS COPD Obesity Lung nodules  [place]  Substance use disorder  CVA  MI PTSD  CHF   Significant Hospital Events: Including procedures, antibiotic start and stop dates in addition to other pertinent events   1/6 admit to [facility]. PCCM consult   Interim History / Subjective:  Not on BiPAP   Objective    Blood pressure 136/63, pulse 66, temperature 97.8 F (36.6 C), temperature source Axillary, resp. rate (!) 23, height 5' 5 (1.651 m), weight 121 kg, last menstrual period 05/29/2017, SpO2 96%.    FiO2 (%):  [21 %] 21 % Pressure Support:  [5 cmH20] 5 cmH20   Intake/Output Summary (Last 24 hours) at 02/01/2024 0832 Last data filed at 02/01/2024 0309 Gross per 24 hour  Intake 101.6 ml  Output 1450 ml  Net -1348.4 ml   Filed Weights   01/31/24 0230  Weight: 121 kg    Examination: General:  NAD HEENT: MM pink/moist; [place] in place Neuro: Aox3; MAE CV: s1s2, RRR, no m/r/g PULM:  dim BS bilaterally; [place] 4L GI: soft, bsx4 active  Extremities: warm/dry, no edema  Skin: no rashes or lesions    Resolved problem list   Assessment and  Plan   AoC resp failure w hypoxia, on home O2   OSA/OHS on CPAP  COPD Tobacco use  LUL pulm mass. Numerous pulm nodules. Omental mass  -very concerning for metastatic cancer.  -several outpt bronchs were scheduled, subsequently cancelled  Plan: -on 2L [place] at home; currently on 4L [place] for sats >92% -bipap qhs -plan for bronch at [facility] on 1/8 -npo at midnight -cont triple therapy nebs -iv steroids -nicotine  patch -currently on rocephin  for possible cap -pulm toiletry   Critical care time: na     RODGER Emilio DEVONNA [facility] 02/01/2024, 8:32 AM  Please see [email or web address] for pager details.  From 7A-7P if no response, please call [phone]. After hours, please call ELink [phone].         "

## 2024-02-01 NOTE — Progress Notes (Signed)
 " PROGRESS NOTE    Denise [name]  FMW:[id number] DOB: 06/20/1970 DOA: 01/30/2024 PCP: Duwaine Annabella SAILOR, FNP   Brief Narrative:  54 y.o. female past medical history of polysubstance abuse, essential hypertension, diabetes mellitus type 2, CVA, COPD, lung mass who was supposed to have outpatient bronchoscopy and biopsy but patient could not make it to the appointment.  She presented with rectal bleeding.  On presentation, she was hypoxic.  CT of the abdomen and pelvis showed left upper lobe mass with numerous bilateral pulmonary nodules and mediastinal lymphadenopathy along with peritoneal carcinomatosis.  She required BiPAP on presentation and was started on IV Solu-Medrol .  Pulmonary and oncology were consulted.  Assessment & Plan:   Acute respiratory failure with hypoxia COPD exacerbation Possible community-acquired bacterial pneumonia: Bacteria unspecified OSA/OHS on CPAP Tobacco use Left upper lobe pulmonary mass along with numerous pulmonary nodules and omental mass - Findings concerning for metastatic cancer.  Several outpatient bronchoscopy were scheduled but subsequently canceled because of no-show.  Pulmonary and oncology following.  Currently on 2 L oxygen via nasal cannula during daytime and BiPAP at night. -Pulmonary planning for bronchoscopy tomorrow at [facility] - Continue Rocephin  - Continue Solu-Medrol  along with current nebs.  Continue nicotine  patch  Hypertension -Continue amlodipine  and Lasix  along with lisinopril .  Leukocytosis Resolved  Hypokalemia Resolved  Acute metabolic acidosis - Resolved  Hypomagnesemia - No labs today  Diabetes mellitus type 2 - A1c 5.8.  Continue CBGs with SSI and carb modified diet  Obesity class III - Outpatient follow-up  Rectal bleeding - Resolved.  Hemoglobin stable.  Outpatient follow-up with GI if needed  Cognitive impairment - Noted.  Outpatient follow-up  Physical deconditioning - PT eval  DVT  prophylaxis: Lovenox  being held for possible bronchoscopy Code Status: Full Family Communication: None at bedside Disposition Plan: Status is: Inpatient Remains inpatient appropriate because: Of severity of illness    Consultants: Pulmonary/oncology  Procedures: None  Antimicrobials: Rocephin    Subjective: Patient seen and examined at bedside.  Still short of breath with exertion.  No fever or vomiting reported.  Objective: Vitals:   02/01/24 0700 02/01/24 0715 02/01/24 0822 02/01/24 0930  BP: 136/63   (!) 166/104  Pulse:    74  Resp: (!) 23   17  Temp:  (!) 97.3 F (36.3 C)    TempSrc:  Axillary    SpO2:   96%   Weight:      Height:        Intake/Output Summary (Last 24 hours) at 02/01/2024 1116 Last data filed at 02/01/2024 1051 Gross per 24 hour  Intake 101.6 ml  Output 1200 ml  Net -1098.4 ml   Filed Weights   01/31/24 0230  Weight: 121 kg    Examination:  General exam: Appears calm and comfortable  Respiratory system: Bilateral decreased breath sounds at bases with scattered crackles Cardiovascular system: S1 & S2 heard, Rate controlled Gastrointestinal system: Abdomen is morbidly obese, nondistended, soft and nontender. Normal bowel sounds heard. Extremities: No cyanosis, clubbing, edema  Central nervous system: Alert and oriented. No focal neurological deficits. Moving extremities Skin: No rashes, lesions or ulcers Psychiatry: Flat affect.  Not agitated.    Data Reviewed: I have personally reviewed following labs and imaging studies  CBC: Recent Labs  Lab 01/30/24 2055 01/31/24 0724 01/31/24 1217  WBC 12.0* 8.1 9.6  NEUTROABS 6.8  --   --   HGB 13.0 13.1 13.4  HCT 38.3 38.9 39.7  MCV 88.7 89.2 89.6  PLT 342 371 346   Basic Metabolic Panel: Recent Labs  Lab 01/30/24 2055 01/31/24 0724  NA 135 135  K 3.4* 4.5  CL 96* 98  CO2 18* 25  GLUCOSE 103* 127*  BUN 7 6  CREATININE 0.70 0.60  CALCIUM  9.5 10.0  MG 1.6*  --     GFR: Estimated Creatinine Clearance: 103.6 mL/min (by C-G formula based on SCr of 0.6 mg/dL). Liver Function Tests: Recent Labs  Lab 01/30/24 2055 01/31/24 0724  AST 17 15  ALT 11 9  ALKPHOS 148* 147*  BILITOT <0.2 <0.2  PROT 7.0 7.1  ALBUMIN 4.3 4.4   No results for input(s): LIPASE, AMYLASE in the last 168 hours. No results for input(s): AMMONIA in the last 168 hours. Coagulation Profile: No results for input(s): INR, PROTIME in the last 168 hours. Cardiac Enzymes: No results for input(s): CKTOTAL, CKMB, CKMBINDEX, TROPONINI in the last 168 hours. BNP (last 3 results) Recent Labs    11/30/23 0908 01/30/24 2055  PROBNP <50.0 <50.0   HbA1C: Recent Labs    01/31/24 0724  HGBA1C 5.8*   CBG: Recent Labs  Lab 01/31/24 0726 01/31/24 1136 01/31/24 1625 01/31/24 2120 02/01/24 0722  GLUCAP 119* 166* 131* 152* 124*   Lipid Profile: No results for input(s): CHOL, HDL, LDLCALC, TRIG, CHOLHDL, LDLDIRECT in the last 72 hours. Thyroid  Function Tests: No results for input(s): TSH, T4TOTAL, FREET4, T3FREE, THYROIDAB in the last 72 hours. Anemia Panel: No results for input(s): VITAMINB12, FOLATE, FERRITIN, TIBC, IRON, RETICCTPCT in the last 72 hours. Sepsis Labs: No results for input(s): PROCALCITON, LATICACIDVEN in the last 168 hours.  Recent Results (from the past 240 hours)  Resp panel by RT-PCR (RSV, Flu A&B, Covid) Anterior Nasal Swab     Status: None   Collection Time: 01/30/24  8:55 PM   Specimen: Anterior Nasal Swab  Result Value Ref Range Status   SARS Coronavirus 2 by RT PCR NEGATIVE NEGATIVE Final    Comment: (NOTE) SARS-CoV-2 target nucleic acids are NOT DETECTED.  The SARS-CoV-2 RNA is generally detectable in upper respiratory specimens during the acute phase of infection. The lowest concentration of SARS-CoV-2 viral copies this assay can detect is 138 copies/mL. A negative result does not preclude  SARS-Cov-2 infection and should not be used as the sole basis for treatment or other patient management decisions. A negative result may occur with  improper specimen collection/handling, submission of specimen other than nasopharyngeal swab, presence of viral mutation(s) within the areas targeted by this assay, and inadequate number of viral copies(<138 copies/mL). A negative result must be combined with clinical observations, patient history, and epidemiological information. The expected result is Negative.  Fact Sheet for Patients:  bloggercourse.com  Fact Sheet for Healthcare Providers:  seriousbroker.it  This test is no t yet approved or cleared by the [place]  FDA and  has been authorized for detection and/or diagnosis of SARS-CoV-2 by FDA under an Emergency Use Authorization (EUA). This EUA will remain  in effect (meaning this test can be used) for the duration of the COVID-19 declaration under Section 564(b)(1) of the Act, 21 U.S.C.section 360bbb-3(b)(1), unless the authorization is terminated  or revoked sooner.       Influenza A by PCR NEGATIVE NEGATIVE Final   Influenza B by PCR NEGATIVE NEGATIVE Final    Comment: (NOTE) The Xpert Xpress SARS-CoV-2/FLU/RSV plus assay is intended as an aid in the diagnosis of influenza from Nasopharyngeal swab specimens and should not be used as a sole  basis for treatment. Nasal washings and aspirates are unacceptable for Xpert Xpress SARS-CoV-2/FLU/RSV testing.  Fact Sheet for Patients: bloggercourse.com  Fact Sheet for Healthcare Providers: seriousbroker.it  This test is not yet approved or cleared by the [place]  FDA and has been authorized for detection and/or diagnosis of SARS-CoV-2 by FDA under an Emergency Use Authorization (EUA). This EUA will remain in effect (meaning this test can be used) for the duration of  the COVID-19 declaration under Section 564(b)(1) of the Act, 21 U.S.C. section 360bbb-3(b)(1), unless the authorization is terminated or revoked.     Resp Syncytial Virus by PCR NEGATIVE NEGATIVE Final    Comment: (NOTE) Fact Sheet for Patients: bloggercourse.com  Fact Sheet for Healthcare Providers: seriousbroker.it  This test is not yet approved or cleared by the [place]  FDA and has been authorized for detection and/or diagnosis of SARS-CoV-2 by FDA under an Emergency Use Authorization (EUA). This EUA will remain in effect (meaning this test can be used) for the duration of the COVID-19 declaration under Section 564(b)(1) of the Act, 21 U.S.C. section 360bbb-3(b)(1), unless the authorization is terminated or revoked.  Performed at [facility], [address], [place], [place] [address]   MRSA Next Gen by PCR, Nasal     Status: None   Collection Time: 01/31/24  2:28 AM   Specimen: Nasal Mucosa; Nasal Swab  Result Value Ref Range Status   MRSA by PCR Next Gen NOT DETECTED NOT DETECTED Final    Comment: (NOTE) The GeneXpert MRSA Assay (FDA approved for NASAL specimens only), is one component of a comprehensive MRSA colonization surveillance program. It is not intended to diagnose MRSA infection nor to guide or monitor treatment for MRSA infections. Test performance is not FDA approved in patients less than 7 years old. Performed at [facility], [address] [place], [place] [address]          Radiology Studies: CT CHEST ABDOMEN PELVIS WO CONTRAST Result Date: 01/31/2024 EXAM: CT CHEST, ABDOMEN AND PELVIS WITHOUT CONTRAST 01/31/2024 01:39:32 AM TECHNIQUE: CT of the chest, abdomen and pelvis was performed without the administration of intravenous contrast. Multiplanar reformatted images are provided for review. Automated exposure control, iterative reconstruction, and/or  weight based adjustment of the mA/kV was utilized to reduce the radiation dose to as low as reasonably achievable. COMPARISON: None available. CLINICAL HISTORY: Respiratory illness, nondiagnostic xray. FINDINGS: CHEST: MEDIASTINUM AND LYMPH NODES: Heart and pericardium are unremarkable. The central airways are clear. Prevascular window adenopathy along with right paratracheal and subcarinal adenopathy again noted, unchanged. No axillary adenopathy. Difficult to assess the hila without intravenous contrast. LUNGS AND PLEURA: Posterior left upper lobe nodule measures 2.1 x 1.9 cm compared to 1.9 cm maximally on prior study. Numerous bilateral 5 to 10 mm pulmonary nodules on prior study and compatible with metastatic disease. No focal consolidation or pulmonary edema. No pleural effusion. No pneumothorax. ABDOMEN AND PELVIS: LIVER: Unremarkable. GALLBLADDER AND BILE DUCTS: Unremarkable. No biliary ductal dilatation. SPLEEN: No acute abnormality. PANCREAS: No acute abnormality. ADRENAL GLANDS: No acute abnormality. KIDNEYS, URETERS AND BLADDER: No stones in the kidneys or ureters. No hydronephrosis. No perinephric or periureteral stranding. Urinary bladder is unremarkable. GI AND BOWEL: Stomach demonstrates no acute abnormality. Scattered colonic diverticulosis. No active diverticulitis. There is no bowel obstruction. REPRODUCTIVE ORGANS: Calcified fibroid in the uterine fundus, stable. No adnexal mass. PERITONEUM AND RETROPERITONEUM: Nodularity in the omentum and peritoneum, worsening since prior study. Conglomerate omental mass in the left lower quadrant  measures up to 6.4 cm. Stable 3 cm water density cystic structure in the retroperitoneum adjacent to the distal aorta and IVC compatible with a benign process. No ascites. No free air. VASCULATURE: Aorta is normal in caliber. Scattered aortic atherosclerosis. ABDOMINAL AND PELVIS LYMPH NODES: No lymphadenopathy. BONES AND SOFT TISSUES: No acute osseous abnormality. No  focal soft tissue abnormality. IMPRESSION: 1. Posterior left upper lobe nodule measures 2.1 x 1.9 cm, increased from 1.9 cm on prior study. This may represent the primary malignancy. 2. Numerous bilateral 5 to 10 mm pulmonary nodules, compatible with metastatic disease. 3. Mediastinal adenopathy, unchanged. 4. Nodularity in the omentum and peritoneum, worsening since prior study, with a conglomerate omental mass in the left lower quadrant measuring up to 6.4 cm compatible with metastatic disease. Electronically signed by: Franky Crease MD 01/31/2024 02:02 AM EST RP Workstation: [id number]   DG Chest Port 1 View Result Date: 01/30/2024 EXAM: 1 VIEW(S) XRAY OF THE CHEST 01/30/2024 09:23:00 PM COMPARISON: Portable chest 01/10/2024. CTA chest 11/30/2023. CLINICAL HISTORY: dyspnea FINDINGS: LINES, TUBES AND DEVICES: Overlying monitor leads. LUNGS AND PLEURA: There are numerous pulmonary nodules throughout the lung fields. Findings consistent with widespread pulmonary metastases. This was better seen on CT as well as an approximately 3 cm left upper lobe presumed primary malignancy superimposing adjacent the aortic knob. There is soft tissue fullness along both hilar silhouettes and AP window, consistent with adenopathy. This was seen previously and may have slightly progressed. No focal pneumonia is evident. No pleural effusion. No pneumothorax. HEART AND MEDIASTINUM: Cardiomegaly is stable. No vascular congestion is seen. There is a mildly tortuous aorta with atherosclerosis. BONES AND SOFT TISSUES: No acute osseous abnormality. IMPRESSION: 1. Stable cardiomegaly without vascular congestion or focal pneumonia. 2. Widespread pulmonary metastases with approximately 3 cm left upper lobe presumed primary malignancy partially visible, and hilar/AP window adenopathy possibly slightly progressed. Electronically signed by: Francis Quam MD 01/30/2024 09:34 PM EST RP Workstation: [id number]        Scheduled Meds:   amLODipine   10 mg Oral Daily   arformoterol   15 mcg Nebulization BID   budesonide  (PULMICORT ) nebulizer solution  0.5 mg Nebulization BID   carbamazepine   400 mg Oral BID   Chlorhexidine  Gluconate Cloth  6 each Topical Daily   furosemide   80 mg Oral Daily   gabapentin   600 mg Oral BID   insulin  aspart  0-15 Units Subcutaneous TID WC   lisinopril   30 mg Oral Daily   [START ON 02/02/2024] methylPREDNISolone  (SOLU-MEDROL ) injection  40 mg Intravenous Daily   nicotine   14 mg Transdermal Daily   mouth rinse  15 mL Mouth Rinse 4 times per day   potassium chloride  SA  20 mEq Oral Daily   revefenacin   175 mcg Nebulization Daily   Continuous Infusions:  cefTRIAXone  (ROCEPHIN )  IV            Sophie Mao, MD [facility] 02/01/2024, 11:16 AM   "

## 2024-02-01 NOTE — Plan of Care (Signed)

## 2024-02-02 ENCOUNTER — Inpatient Hospital Stay (HOSPITAL_COMMUNITY)

## 2024-02-02 ENCOUNTER — Encounter (HOSPITAL_COMMUNITY): Payer: Self-pay | Admitting: Internal Medicine

## 2024-02-02 ENCOUNTER — Inpatient Hospital Stay (HOSPITAL_COMMUNITY): Admitting: Anesthesiology

## 2024-02-02 ENCOUNTER — Encounter (HOSPITAL_COMMUNITY)
Admission: EM | Disposition: A | Discharge status: Home / Self Care | Payer: Self-pay | Source: Home / Self Care | Attending: Internal Medicine

## 2024-02-02 DIAGNOSIS — J9601 Acute respiratory failure with hypoxia: Secondary | ICD-10-CM | POA: Diagnosis not present

## 2024-02-02 DIAGNOSIS — Z720 Tobacco use: Secondary | ICD-10-CM | POA: Diagnosis not present

## 2024-02-02 DIAGNOSIS — I5032 Chronic diastolic (congestive) heart failure: Secondary | ICD-10-CM

## 2024-02-02 DIAGNOSIS — I110 Hypertensive heart disease with heart failure: Secondary | ICD-10-CM

## 2024-02-02 DIAGNOSIS — F17210 Nicotine dependence, cigarettes, uncomplicated: Secondary | ICD-10-CM

## 2024-02-02 DIAGNOSIS — J9621 Acute and chronic respiratory failure with hypoxia: Secondary | ICD-10-CM | POA: Diagnosis not present

## 2024-02-02 DIAGNOSIS — R911 Solitary pulmonary nodule: Secondary | ICD-10-CM | POA: Diagnosis not present

## 2024-02-02 DIAGNOSIS — J449 Chronic obstructive pulmonary disease, unspecified: Secondary | ICD-10-CM | POA: Diagnosis not present

## 2024-02-02 DIAGNOSIS — R918 Other nonspecific abnormal finding of lung field: Secondary | ICD-10-CM | POA: Diagnosis not present

## 2024-02-02 HISTORY — PX: VIDEO BRONCHOSCOPY WITH RADIAL ENDOBRONCHIAL ULTRASOUND: SHX6849

## 2024-02-02 HISTORY — PX: BRONCHIAL NEEDLE ASPIRATION BIOPSY: SHX5106

## 2024-02-02 HISTORY — PX: BRONCHIAL WASHINGS: SHX5105

## 2024-02-02 HISTORY — PX: VIDEO BRONCHOSCOPY WITH ENDOBRONCHIAL NAVIGATION: SHX6175

## 2024-02-02 HISTORY — PX: VIDEO BRONCHOSCOPY WITH ENDOBRONCHIAL ULTRASOUND: SHX6177

## 2024-02-02 LAB — GLUCOSE, CAPILLARY
Glucose-Capillary: 112 mg/dL — ABNORMAL HIGH (ref 70–99)
Glucose-Capillary: 130 mg/dL — ABNORMAL HIGH (ref 70–99)
Glucose-Capillary: 174 mg/dL — ABNORMAL HIGH (ref 70–99)
Glucose-Capillary: 125 mg/dL — ABNORMAL HIGH (ref 70–99)

## 2024-02-02 MED ORDER — LIDOCAINE 2% (20 MG/ML) 5 ML SYRINGE
INTRAMUSCULAR | Status: DC | PRN
  Administered 2024-02-02: 100 mg via INTRAVENOUS

## 2024-02-02 MED ORDER — POLYETHYLENE GLYCOL 3350 17 G PO PACK
17.0000 g | PACK | Freq: Every day | ORAL | Status: DC
  Administered 2024-02-02: 17 g via ORAL
  Filled 2024-02-02: qty 1

## 2024-02-02 MED ORDER — FENTANYL CITRATE (PF) 100 MCG/2ML IJ SOLN
INTRAMUSCULAR | Status: DC | PRN
  Administered 2024-02-02: 100 ug via INTRAVENOUS

## 2024-02-02 MED ORDER — INSULIN ASPART 100 UNIT/ML IJ SOLN: 0.0000 [IU] | INTRAMUSCULAR | Status: DC | PRN

## 2024-02-02 MED ORDER — BISACODYL 10 MG RE SUPP: 10.0000 mg | Freq: Every day | RECTAL | Status: DC | PRN

## 2024-02-02 MED ORDER — CHLORHEXIDINE GLUCONATE 0.12 % MT SOLN
15.0000 mL | Freq: Once | OROMUCOSAL | Status: AC
  Administered 2024-02-02: 15 mL via OROMUCOSAL
  Filled 2024-02-02: qty 15

## 2024-02-02 MED ORDER — PROPOFOL 10 MG/ML IV BOLUS
INTRAVENOUS | Status: DC | PRN
  Administered 2024-02-02: 50 mg via INTRAVENOUS
  Administered 2024-02-02: 20 mg via INTRAVENOUS
  Administered 2024-02-02: 100 mg via INTRAVENOUS

## 2024-02-02 MED ORDER — LACTATED RINGERS IV SOLN: INTRAVENOUS | Status: DC

## 2024-02-02 MED ORDER — PROPOFOL 500 MG/50ML IV EMUL
INTRAVENOUS | Status: DC | PRN
  Administered 2024-02-02: 100 ug/kg/min via INTRAVENOUS

## 2024-02-02 MED ORDER — FENTANYL CITRATE (PF) 100 MCG/2ML IJ SOLN: 25.0000 ug | INTRAMUSCULAR | Status: DC | PRN

## 2024-02-02 MED ORDER — SUGAMMADEX SODIUM 200 MG/2ML IV SOLN
INTRAVENOUS | Status: DC | PRN
  Administered 2024-02-02: 400 mg via INTRAVENOUS

## 2024-02-02 MED ORDER — ENOXAPARIN SODIUM 60 MG/0.6ML IJ SOSY
60.0000 mg | PREFILLED_SYRINGE | INTRAMUSCULAR | Status: DC
  Administered 2024-02-02: 60 mg via SUBCUTANEOUS
  Filled 2024-02-02: qty 0.6

## 2024-02-02 MED ORDER — OXYCODONE HCL 5 MG/5ML PO SOLN: 5.0000 mg | Freq: Once | ORAL | Status: DC | PRN

## 2024-02-02 MED ORDER — MIDAZOLAM HCL (PF) 2 MG/2ML IJ SOLN
INTRAMUSCULAR | Status: DC | PRN
  Administered 2024-02-02: 2 mg via INTRAVENOUS

## 2024-02-02 MED ORDER — MIDAZOLAM HCL 2 MG/2ML IJ SOLN
INTRAMUSCULAR | Status: AC
  Filled 2024-02-02: qty 2

## 2024-02-02 MED ORDER — PHENYLEPHRINE HCL-NACL 20-0.9 MG/250ML-% IV SOLN
INTRAVENOUS | Status: DC | PRN
  Administered 2024-02-02: 50 ug/min via INTRAVENOUS

## 2024-02-02 MED ORDER — PROPOFOL 10 MG/ML IV BOLUS: INTRAVENOUS | Status: DC | PRN

## 2024-02-02 MED ORDER — DEXAMETHASONE SOD PHOSPHATE PF 10 MG/ML IJ SOLN
INTRAMUSCULAR | Status: DC | PRN
  Administered 2024-02-02: 10 mg via INTRAVENOUS

## 2024-02-02 MED ORDER — ROCURONIUM BROMIDE 10 MG/ML (PF) SYRINGE
PREFILLED_SYRINGE | INTRAVENOUS | Status: DC | PRN
  Administered 2024-02-02: 50 mg via INTRAVENOUS
  Administered 2024-02-02 (×2): 30 mg via INTRAVENOUS
  Administered 2024-02-02: 20 mg via INTRAVENOUS
  Administered 2024-02-02: 15 mg via INTRAVENOUS

## 2024-02-02 MED ORDER — FENTANYL CITRATE (PF) 100 MCG/2ML IJ SOLN
INTRAMUSCULAR | Status: AC
  Filled 2024-02-02: qty 2

## 2024-02-02 MED ORDER — SENNOSIDES-DOCUSATE SODIUM 8.6-50 MG PO TABS
1.0000 | ORAL_TABLET | Freq: Two times a day (BID) | ORAL | Status: DC
  Administered 2024-02-02: 1 via ORAL
  Filled 2024-02-02: qty 1

## 2024-02-02 MED ORDER — OXYCODONE HCL 5 MG PO TABS: 5.0000 mg | ORAL_TABLET | Freq: Once | ORAL | Status: DC | PRN

## 2024-02-02 MED ORDER — ACETAMINOPHEN 10 MG/ML IV SOLN: 1000.0000 mg | Freq: Once | INTRAVENOUS | Status: DC | PRN

## 2024-02-02 NOTE — Plan of Care (Signed)
" °  Problem: Education: Goal: Ability to describe self-care measures that may prevent or decrease complications (Diabetes Survival Skills Education) will improve Outcome: Progressing   Problem: Coping: Goal: Ability to adjust to condition or change in health will improve Outcome: Progressing   Problem: Fluid Volume: Goal: Ability to maintain a balanced intake and output will improve Outcome: Progressing   Problem: Nutritional: Goal: Maintenance of adequate nutrition will improve Outcome: Progressing Goal: Progress toward achieving an optimal weight will improve Outcome: Progressing   Problem: Education: Goal: Knowledge of General Education information will improve Description: Including pain rating scale, medication(s)/side effects and non-pharmacologic comfort measures Outcome: Progressing   Problem: Clinical Measurements: Goal: Ability to maintain clinical measurements within normal limits will improve Outcome: Progressing Goal: Will remain free from infection Outcome: Progressing Goal: Diagnostic test results will improve Outcome: Progressing   "

## 2024-02-02 NOTE — Progress Notes (Signed)
 " PROGRESS NOTE    Jalaila Humphrey-Headen  FMW:[id number] DOB: 09-May-1970 DOA: 01/30/2024 PCP: Duwaine Annabella SAILOR, FNP   Brief Narrative:  54 y.o. female past medical history of polysubstance abuse, essential hypertension, diabetes mellitus type 2, CVA, COPD, lung mass who was supposed to have outpatient bronchoscopy and biopsy but patient could not make it to the appointment.  She presented with rectal bleeding.  On presentation, she was hypoxic.  CT of the abdomen and pelvis showed left upper lobe mass with numerous bilateral pulmonary nodules and mediastinal lymphadenopathy along with peritoneal carcinomatosis.  She required BiPAP on presentation and was started on IV Solu-Medrol .  Pulmonary and oncology were consulted.  Assessment & Plan:   Acute respiratory failure with hypoxia COPD exacerbation Possible community-acquired bacterial pneumonia: Bacteria unspecified OSA/OHS on CPAP Tobacco use Left upper lobe pulmonary mass along with numerous pulmonary nodules and omental mass - Findings concerning for metastatic cancer.  Several outpatient bronchoscopy were scheduled but subsequently canceled because of no-show.  Pulmonary and oncology following.  Currently on 2 L oxygen via nasal cannula during daytime and BiPAP at night. -Pulmonary planning for bronchoscopy today at [facility] - Continue Rocephin  - Continue Solu-Medrol  along with current nebs.  Continue nicotine  patch  Hypertension -Continue amlodipine  and Lasix  along with lisinopril .  Leukocytosis Resolved  Hypokalemia Resolved  Acute metabolic acidosis - Resolved  Hypomagnesemia - No labs today  Diabetes mellitus type 2 - A1c 5.8.  Continue CBGs with SSI and carb modified diet  Obesity class III - Outpatient follow-up  Rectal bleeding - Resolved.  Hemoglobin stable.  Outpatient follow-up with GI if needed  Cognitive impairment - Noted.  Outpatient follow-up  Physical deconditioning - PT eval  DVT  prophylaxis: Lovenox  being held for possible bronchoscopy Code Status: Full Family Communication: None at bedside Disposition Plan: Status is: Inpatient Remains inpatient appropriate because: Of severity of illness    Consultants: Pulmonary/oncology  Procedures: None  Antimicrobials: Rocephin    Subjective: Patient seen and examined at bedside.  Continues to have cough and shortness of breath. No chest pain, vomiting reported.  Objective: Vitals:   02/02/24 0200 02/02/24 0300 02/02/24 0400 02/02/24 0500  BP: (!) 159/68 (!) 153/61 136/67 (!) 156/69  Pulse:      Resp: 14 12 11 12   Temp:  98.1 F (36.7 C)    TempSrc:  Axillary    SpO2:      Weight:      Height:        Intake/Output Summary (Last 24 hours) at 02/02/2024 0726 Last data filed at 02/02/2024 0557 Gross per 24 hour  Intake 220.01 ml  Output 1450 ml  Net -1229.99 ml   Filed Weights   01/31/24 0230  Weight: 121 kg    Examination:  General: Chronically ill and deconditioned.  No distress ENT/neck: No thyromegaly.  JVD is not elevated  respiratory: Decreased breath sounds at bases bilaterally with some crackles and wheezing  CVS: S1-S2 heard, rate controlled currently Abdominal: Soft, morbidly obese, nontender, slightly distended; no organomegaly, bowel sounds are heard Extremities: Trace lower extremity edema; no cyanosis  CNS: Awake and alert.  Slow to respond.  Poor historian.  No focal neurologic deficit.  Moves extremities Lymph: No obvious lymphadenopathy Skin: No obvious ecchymosis/lesions  psych: Mostly flat affect.  Currently not agitated. musculoskeletal: No obvious joint swelling/deformity     Data Reviewed: I have personally reviewed following labs and imaging studies  CBC: Recent Labs  Lab 01/30/24 2055 01/31/24 0724 01/31/24 1217  WBC 12.0* 8.1 9.6  NEUTROABS 6.8  --   --   HGB 13.0 13.1 13.4  HCT 38.3 38.9 39.7  MCV 88.7 89.2 89.6  PLT 342 371 346   Basic Metabolic  Panel: Recent Labs  Lab 01/30/24 2055 01/31/24 0724  NA 135 135  K 3.4* 4.5  CL 96* 98  CO2 18* 25  GLUCOSE 103* 127*  BUN 7 6  CREATININE 0.70 0.60  CALCIUM  9.5 10.0  MG 1.6*  --    GFR: Estimated Creatinine Clearance: 103.6 mL/min (by C-G formula based on SCr of 0.6 mg/dL). Liver Function Tests: Recent Labs  Lab 01/30/24 2055 01/31/24 0724  AST 17 15  ALT 11 9  ALKPHOS 148* 147*  BILITOT <0.2 <0.2  PROT 7.0 7.1  ALBUMIN 4.3 4.4   No results for input(s): LIPASE, AMYLASE in the last 168 hours. No results for input(s): AMMONIA in the last 168 hours. Coagulation Profile: No results for input(s): INR, PROTIME in the last 168 hours. Cardiac Enzymes: No results for input(s): CKTOTAL, CKMB, CKMBINDEX, TROPONINI in the last 168 hours. BNP (last 3 results) Recent Labs    11/30/23 0908 01/30/24 2055  PROBNP <50.0 <50.0   HbA1C: Recent Labs    01/31/24 0724  HGBA1C 5.8*   CBG: Recent Labs  Lab 01/31/24 2120 02/01/24 0722 02/01/24 1135 02/01/24 1630 02/01/24 2111  GLUCAP 152* 124* 137* 101* 132*   Lipid Profile: No results for input(s): CHOL, HDL, LDLCALC, TRIG, CHOLHDL, LDLDIRECT in the last 72 hours. Thyroid  Function Tests: No results for input(s): TSH, T4TOTAL, FREET4, T3FREE, THYROIDAB in the last 72 hours. Anemia Panel: No results for input(s): VITAMINB12, FOLATE, FERRITIN, TIBC, IRON, RETICCTPCT in the last 72 hours. Sepsis Labs: No results for input(s): PROCALCITON, LATICACIDVEN in the last 168 hours.  Recent Results (from the past 240 hours)  Resp panel by RT-PCR (RSV, Flu A&B, Covid) Anterior Nasal Swab     Status: None   Collection Time: 01/30/24  8:55 PM   Specimen: Anterior Nasal Swab  Result Value Ref Range Status   SARS Coronavirus 2 by RT PCR NEGATIVE NEGATIVE Final    Comment: (NOTE) SARS-CoV-2 target nucleic acids are NOT DETECTED.  The SARS-CoV-2 RNA is generally detectable in  upper respiratory specimens during the acute phase of infection. The lowest concentration of SARS-CoV-2 viral copies this assay can detect is 138 copies/mL. A negative result does not preclude SARS-Cov-2 infection and should not be used as the sole basis for treatment or other patient management decisions. A negative result may occur with  improper specimen collection/handling, submission of specimen other than nasopharyngeal swab, presence of viral mutation(s) within the areas targeted by this assay, and inadequate number of viral copies(<138 copies/mL). A negative result must be combined with clinical observations, patient history, and epidemiological information. The expected result is Negative.  Fact Sheet for Patients:  bloggercourse.com  Fact Sheet for Healthcare Providers:  seriousbroker.it  This test is no t yet approved or cleared by the [place]  FDA and  has been authorized for detection and/or diagnosis of SARS-CoV-2 by FDA under an Emergency Use Authorization (EUA). This EUA will remain  in effect (meaning this test can be used) for the duration of the COVID-19 declaration under Section 564(b)(1) of the Act, 21 U.S.C.section 360bbb-3(b)(1), unless the authorization is terminated  or revoked sooner.       Influenza A by PCR NEGATIVE NEGATIVE Final   Influenza B by PCR NEGATIVE NEGATIVE Final    Comment: (  NOTE) The Xpert Xpress SARS-CoV-2/FLU/RSV plus assay is intended as an aid in the diagnosis of influenza from Nasopharyngeal swab specimens and should not be used as a sole basis for treatment. Nasal washings and aspirates are unacceptable for Xpert Xpress SARS-CoV-2/FLU/RSV testing.  Fact Sheet for Patients: bloggercourse.com  Fact Sheet for Healthcare Providers: seriousbroker.it  This test is not yet approved or cleared by the [place]  FDA and has been  authorized for detection and/or diagnosis of SARS-CoV-2 by FDA under an Emergency Use Authorization (EUA). This EUA will remain in effect (meaning this test can be used) for the duration of the COVID-19 declaration under Section 564(b)(1) of the Act, 21 U.S.C. section 360bbb-3(b)(1), unless the authorization is terminated or revoked.     Resp Syncytial Virus by PCR NEGATIVE NEGATIVE Final    Comment: (NOTE) Fact Sheet for Patients: bloggercourse.com  Fact Sheet for Healthcare Providers: seriousbroker.it  This test is not yet approved or cleared by the [place]  FDA and has been authorized for detection and/or diagnosis of SARS-CoV-2 by FDA under an Emergency Use Authorization (EUA). This EUA will remain in effect (meaning this test can be used) for the duration of the COVID-19 declaration under Section 564(b)(1) of the Act, 21 U.S.C. section 360bbb-3(b)(1), unless the authorization is terminated or revoked.  Performed at [facility], [address] [place], [place] [address]   MRSA Next Gen by PCR, Nasal     Status: None   Collection Time: 01/31/24  2:28 AM   Specimen: Nasal Mucosa; Nasal Swab  Result Value Ref Range Status   MRSA by PCR Next Gen NOT DETECTED NOT DETECTED Final    Comment: (NOTE) The GeneXpert MRSA Assay (FDA approved for NASAL specimens only), is one component of a comprehensive MRSA colonization surveillance program. It is not intended to diagnose MRSA infection nor to guide or monitor treatment for MRSA infections. Test performance is not FDA approved in patients less than 70 years old. Performed at [facility], [address], [place], [place] [address]          Radiology Studies: No results found.       Scheduled Meds:  amLODipine   10 mg Oral Daily   arformoterol   15 mcg Nebulization BID   budesonide  (PULMICORT ) nebulizer solution  0.5 mg  Nebulization BID   carbamazepine   400 mg Oral BID   Chlorhexidine  Gluconate Cloth  6 each Topical Daily   furosemide   80 mg Oral Daily   gabapentin   600 mg Oral BID   insulin  aspart  0-15 Units Subcutaneous TID WC   lisinopril   30 mg Oral Daily   methylPREDNISolone  (SOLU-MEDROL ) injection  40 mg Intravenous Daily   nicotine   14 mg Transdermal Daily   mouth rinse  15 mL Mouth Rinse 4 times per day   potassium chloride  SA  20 mEq Oral Daily   revefenacin   175 mcg Nebulization Daily   Continuous Infusions:  cefTRIAXone  (ROCEPHIN )  IV Stopped (02/01/24 2318)          Sophie Mao, MD [facility] 02/02/2024, 7:26 AM   "

## 2024-02-02 NOTE — Anesthesia Preprocedure Evaluation (Signed)
 "                                  Anesthesia Evaluation  Patient identified by MRN, date of birth, ID band Patient awake    Reviewed: Allergy & Precautions, NPO status , Patient's Chart, lab work & pertinent test results  History of Anesthesia Complications Negative for: history of anesthetic complications  Airway Mallampati: III  TM Distance: >3 FB Neck ROM: Full    Dental no notable dental hx. (+) Teeth Intact   Pulmonary sleep apnea , COPD,  oxygen dependent, Current Smoker and Patient abstained from smoking. 2L/min O2 constantly    + decreased breath sounds      Cardiovascular Exercise Tolerance: Good METShypertension, + Past MI, +CHF and + DOE  (-) CAD (-) dysrhythmias  Rhythm:Regular Rate:Normal - Systolic murmurs    Neuro/Psych  PSYCHIATRIC DISORDERS Anxiety Depression    CVA, No Residual Symptoms    GI/Hepatic ,neg GERD  ,,(+)     (-) substance abuse    Endo/Other  diabetes  Class 3 obesity  Renal/GU negative Renal ROS     Musculoskeletal   Abdominal  (+) + obese  Peds  Hematology   Anesthesia Other Findings Past Medical History: No date: Abnormal EKG No date: Arthritis 05/28/2016: Atopic dermatitis 07/29/2021: Back pain No date: Blood transfusion without reported diagnosis 10/20/2022: Borderline glaucoma with ocular hypertension, bilateral 07/29/2021: Chronic diastolic CHF (congestive heart failure) 06/12/2020: Class 3 obesity with alveolar hypoventilation, serious  comorbidity, and body mass index (BMI) of 50.0 to 59.9 in adult 2017: CVA (cerebrovascular accident)     Comment:  2017 MRI - chronic left lenticulostriate territory               hemorrhage or hemorrhagic infarct, with hemosiderin, and               encephalomalacia affecting both the caudate and lentiform              nucleus   06/15/2021: Decreased mobility 06/12/2020: DOE (dyspnea on exertion) 12/18/2014: Essential hypertension 12/18/2014: Fibroid  uterus 06/29/2021: Heart murmur, systolic 02/16/2017: History of alcohol abuse 02/16/2017: History of cocaine use 02/16/2017: History of latent syphilis 11/07/2020: Hyperlipidemia associated with type 2 diabetes mellitus No date: Incontinence of urine No date: Lacunar infarction     Comment:  MRI - chronic lacunar infarct within the posterior right              lentiform nucleus.   09/25/2019: Lithium  toxicity 11/21/2017: Low back pain No date: Lumbar degenerative disc disease 02/16/2017: Major depressive disorder No date: Myocardial infarction [facility])     Comment:  2015 in [facility] 10/20/2022: Nuclear sclerosis of both eyes 06/12/2020: OSA on CPAP No date: Overactive bladder 05/26/2020: Pain due to onychomycosis of toenails of both feet 12/18/2014: Pelvic pain in female 02/17/2017: Psychogenic nonepileptic seizure No date: PTSD (post-traumatic stress disorder) 08/31/2021: Pulmonary emphysema 06/29/2021: QT prolongation No date: Right sided weakness 11/03/2017: Social anxiety disorder 02/07/2015: Syncope No date: TIA (transient ischemic attack) 04/14/2015: Tobacco use 10/21/2017: Type 2 diabetes mellitus 06/15/2021: Venous stasis dermatitis of both lower extremities  Reproductive/Obstetrics                              Anesthesia Physical Anesthesia Plan  ASA: 4  Anesthesia Plan: General   Post-op Pain Management: Minimal or no pain anticipated  Induction: Intravenous  PONV Risk Score and Plan: 2 and Ondansetron , Dexamethasone , Midazolam , TIVA and Propofol  infusion  Airway Management Planned: Oral ETT  Additional Equipment: None  Intra-op Plan:   Post-operative Plan: Extubation in OR  Informed Consent: I have reviewed the patients History and Physical, chart, labs and discussed the procedure including the risks, benefits and alternatives for the proposed anesthesia with the patient or authorized representative who has indicated his/her  understanding and acceptance.     Dental advisory given  Plan Discussed with: CRNA and Surgeon  Anesthesia Plan Comments: (Discussed risks of anesthesia with patient, including PONV, sore throat, lip/dental/eye damage. Rare risks discussed as well, such as cardiorespiratory and neurological sequelae, and allergic reactions. Discussed the role of CRNA in patient's perioperative care. Patient understands. Patient counseled on being higher risk for anesthesia due to comorbidities: class 3 obesity, O2 dependent COPD. Patient was told about increased risk of cardiac and respiratory events, including death. )        Anesthesia Quick Evaluation  "

## 2024-02-02 NOTE — Progress Notes (Signed)
" °   02/02/24 0200  BiPAP/CPAP/SIPAP  $ Non-Invasive Ventilator  Non-Invasive Vent Subsequent  BiPAP/CPAP/SIPAP Pt Type Adult  BiPAP/CPAP/SIPAP Resmed  Mask Type Full face mask  Dentures removed? Not applicable  Set Rate 0 breaths/min  Respiratory Rate 19 breaths/min  IPAP 10 cmH20  EPAP 5 cmH2O  FiO2 (%) 21 %  Patient Home Machine No  Patient Home Mask No  Patient Home Tubing No  CPAP/SIPAP surface wiped down Yes  Device Plugged into RED Power Outlet Yes    "

## 2024-02-02 NOTE — Transfer of Care (Signed)
 Immediate Anesthesia Transfer of Care Note  Patient: Denise [name]  Procedure(s) Performed: VIDEO BRONCHOSCOPY WITH ENDOBRONCHIAL NAVIGATION (Bilateral) BRONCHOSCOPY, WITH EBUS  Patient Location: PACU  Anesthesia Type:General  Level of Consciousness: awake and drowsy  Airway & Oxygen Therapy: Patient Spontanous Breathing and Patient connected to face mask oxygen  Post-op Assessment: Report given to RN and Post -op Vital signs reviewed and stable  Post vital signs: Reviewed and stable  Last Vitals:  Vitals Value Taken Time  BP 119/63 02/02/24 13:49  Temp 36.4 C 02/02/24 13:49  Pulse 78 02/02/24 13:49  Resp 21 02/02/24 13:49  SpO2 97 % 02/02/24 13:49  Vitals shown include unfiled device data.  Last Pain:  Vitals:   02/02/24 1349  TempSrc: Temporal  PainSc: 0-No pain         Complications: No notable events documented.

## 2024-02-02 NOTE — Anesthesia Postprocedure Evaluation (Signed)
"   Anesthesia Post Note  Patient: Denise [name]  Procedure(s) Performed: VIDEO BRONCHOSCOPY WITH ENDOBRONCHIAL NAVIGATION (Bilateral) BRONCHOSCOPY, WITH EBUS     Patient location during evaluation: PACU Anesthesia Type: General Level of consciousness: awake and alert Pain management: pain level controlled Vital Signs Assessment: post-procedure vital signs reviewed and stable Respiratory status: spontaneous breathing, nonlabored ventilation, respiratory function stable and patient connected to nasal cannula oxygen Cardiovascular status: blood pressure returned to baseline and stable Postop Assessment: no apparent nausea or vomiting Anesthetic complications: no   No notable events documented.  Last Vitals:  Vitals:   02/02/24 1410 02/02/24 1420  BP:  125/88  Pulse: 80 79  Resp: 19 (!) 27  Temp:    SpO2: 94% 94%    Last Pain:  Vitals:   02/02/24 1420  TempSrc:   PainSc: 0-No pain                 [name]      "

## 2024-02-02 NOTE — H&P (View-Only) (Signed)
" ° °  NAME:  Denise [name], MRN:  [id number], DOB:  [birth year], LOS: 2 ADMISSION DATE:  01/30/2024, CONSULTATION DATE:  01/31/24 REFERRING MD:  Odell Castor , CHIEF COMPLAINT:  SOB   History of Present Illness:  54 yo chronic hypoxic resp failure on home O2, OSA/OHS on CPAP, tobacco use, COPD, with known enlarging L lung mass + pulm nodules was admitted to [facility] 01/31/24 after presenting to ED w SOB. In chart review looks like she had some associated abd discomfort and recal bleeding.  She was placed on BiPAP and PCCM was consulted for SOB on BiPAP   In review of ED imaging, she has enlarging LUL mass + numeorus pulm nodules + omental mass. In chart review it looks like there have been several attempts at coordinating a bronch but all were cancelled. In d/w pt she sites lack of transportation.   Pertinent  Medical History  Tobacco use Chronic hypoxia OSA OHS COPD Obesity Lung nodules  TN  Substance use disorder  CVA  MI PTSD  CHF   Significant Hospital Events: Including procedures, antibiotic start and stop dates in addition to other pertinent events   1/6 admit to [facility]. PCCM consult   Interim History / Subjective:  Stable on baseline 2L [place]  Objective    Blood pressure (!) 156/69, pulse 71, temperature 98.1 F (36.7 C), temperature source Axillary, resp. rate 12, height 5' 5 (1.651 m), weight 121 kg, last menstrual period 05/29/2017, SpO2 92%.    FiO2 (%):  [21 %] 21 %   Intake/Output Summary (Last 24 hours) at 02/02/2024 0807 Last data filed at 02/02/2024 0557 Gross per 24 hour  Intake 220.01 ml  Output 1450 ml  Net -1229.99 ml   Filed Weights   01/31/24 0230  Weight: 121 kg    Examination: General:  NAD HEENT: MM pink/moist; [place] in place Neuro: Aox3; MAE CV: s1s2, RRR, no m/r/g PULM:  dim BS bilaterally; [place] 2L GI: soft, bsx4 active  Extremities: warm/dry, no edema  Skin: no rashes or lesions    Resolved problem list   Assessment and Plan   AoC resp failure  w hypoxia, on home O2   OSA/OHS on CPAP  COPD Tobacco use  LUL pulm mass. Numerous pulm nodules. Omental mass  -very concerning for metastatic cancer.  -several outpt bronchs were scheduled, subsequently cancelled  Plan: -on 2L [place] at home; cont [place] for sat goal >92% -switch bipap to cpap qhs -plan for bronch today at [facility] on 1/8 -npo -cont triple therapy nebs; can likely transition back to inhaler form since now out of acute phase -iv steroids; consider switching to oral prednisone  tomorrow -nicotine  patch -currently on rocephin  for possible cap per primary -pulm toiletry   Critical care time: na     RODGER Emilio RIGGERS [facility] 02/02/2024, 8:07 AM  Please see [email or web address] for pager details.  From 7A-7P if no response, please call [phone]. After hours, please call ELink [phone].         "

## 2024-02-02 NOTE — Anesthesia Procedure Notes (Signed)
 Procedure Name: Intubation Date/Time: 02/02/2024 12:15 PM  Performed by: Carolee Lauraine DASEN, CRNAPre-anesthesia Checklist: Patient identified, Emergency Drugs available, Suction available and Patient being monitored Patient Re-evaluated:Patient Re-evaluated prior to induction Oxygen Delivery Method: Circle System Utilized Preoxygenation: Pre-oxygenation with 100% oxygen Induction Type: IV induction Ventilation: Mask ventilation without difficulty and Oral airway inserted - appropriate to patient size Laryngoscope Size: 2 Grade View: Grade I Tube type: Oral Tube size: 8.5 mm Number of attempts: 1 Airway Equipment and Method: Stylet and Oral airway Placement Confirmation: ETT inserted through vocal cords under direct vision, positive ETCO2 and breath sounds checked- equal and bilateral Secured at: 23 cm Tube secured with: Tape Dental Injury: Teeth and Oropharynx as per pre-operative assessment

## 2024-02-02 NOTE — Interval H&P Note (Signed)
 History and Physical Interval Note:  02/02/2024 11:07 AM  Denise [name]  has presented today for surgery, with the diagnosis of Bilateral lung metastasis with a slightly larger left upper lobe nodule that could potentially be the primary.  The various methods of treatment have been discussed with the patient and family. After consideration of risks, benefits and other options for treatment, the patient has consented to  Procedures: VIDEO BRONCHOSCOPY WITH ENDOBRONCHIAL NAVIGATION (Bilateral) BRONCHOSCOPY, WITH EBUS (N/A) as a surgical intervention.  The patient's history has been reviewed, patient examined, no change in status, stable for surgery.  I have reviewed the patient's chart and labs.  Questions were answered to the patient's satisfaction.     [name]

## 2024-02-02 NOTE — Progress Notes (Signed)
" ° °  NAME:  Denise [name], MRN:  [id number], DOB:  [birth year], LOS: 2 ADMISSION DATE:  01/30/2024, CONSULTATION DATE:  01/31/24 REFERRING MD:  Odell Castor , CHIEF COMPLAINT:  SOB   History of Present Illness:  54 yo chronic hypoxic resp failure on home O2, OSA/OHS on CPAP, tobacco use, COPD, with known enlarging L lung mass + pulm nodules was admitted to [facility] 01/31/24 after presenting to ED w SOB. In chart review looks like she had some associated abd discomfort and recal bleeding.  She was placed on BiPAP and PCCM was consulted for SOB on BiPAP   In review of ED imaging, she has enlarging LUL mass + numeorus pulm nodules + omental mass. In chart review it looks like there have been several attempts at coordinating a bronch but all were cancelled. In d/w pt she sites lack of transportation.   Pertinent  Medical History  Tobacco use Chronic hypoxia OSA OHS COPD Obesity Lung nodules  [place]  Substance use disorder  CVA  MI PTSD  CHF   Significant Hospital Events: Including procedures, antibiotic start and stop dates in addition to other pertinent events   1/6 admit to [facility]. PCCM consult   Interim History / Subjective:  Stable on baseline 2L [place]  Objective    Blood pressure (!) 156/69, pulse 71, temperature 98.1 F (36.7 C), temperature source Axillary, resp. rate 12, height 5' 5 (1.651 m), weight 121 kg, last menstrual period 05/29/2017, SpO2 92%.    FiO2 (%):  [21 %] 21 %   Intake/Output Summary (Last 24 hours) at 02/02/2024 0807 Last data filed at 02/02/2024 0557 Gross per 24 hour  Intake 220.01 ml  Output 1450 ml  Net -1229.99 ml   Filed Weights   01/31/24 0230  Weight: 121 kg    Examination: General:  NAD HEENT: MM pink/moist; [place] in place Neuro: Aox3; MAE CV: s1s2, RRR, no m/r/g PULM:  dim BS bilaterally; [place] 2L GI: soft, bsx4 active  Extremities: warm/dry, no edema  Skin: no rashes or lesions    Resolved problem list   Assessment and Plan   AoC resp failure  w hypoxia, on home O2   OSA/OHS on CPAP  COPD Tobacco use  LUL pulm mass. Numerous pulm nodules. Omental mass  -very concerning for metastatic cancer.  -several outpt bronchs were scheduled, subsequently cancelled  Plan: -on 2L [place] at home; cont [place] for sat goal >92% -switch bipap to cpap qhs -plan for bronch today at [facility] on 1/8 -npo -cont triple therapy nebs; can likely transition back to inhaler form since now out of acute phase -iv steroids; consider switching to oral prednisone  tomorrow -nicotine  patch -currently on rocephin  for possible cap per primary -pulm toiletry   Critical care time: na     RODGER Emilio RIGGERS [facility] 02/02/2024, 8:07 AM  Please see [email or web address] for pager details.  From 7A-7P if no response, please call [phone]. After hours, please call ELink [phone].         "

## 2024-02-02 NOTE — Op Note (Signed)
 Video Bronchoscopy with Endobronchial Ultrasound and Electromagnetic Navigation Procedure Note  Date of Operation: 02/02/2024  Pre-op Diagnosis: Lung nodule  Surgeon: Zola Herter, MD   Anesthesia: General endotracheal anesthesia  Operation: Flexible video fiberoptic bronchoscopy with endobronchial ultrasound, robotic assisted navigation and biopsies.  Estimated Blood Loss: Minimal  Complications: None  Indications and History: Denise [name] is a 54 y.o. female with suspected metastatic disease with multiple ling nodules with a left upper lobe nodule measuring the biggest .  Recommendation was made to achieve a tissue diagnosis using endobronchial ultrasound and robotic assisted navigational bronchoscopy. The risks, benefits, complications, treatment options and expected outcomes were discussed with the patient.  The possibilities of pneumothorax, pneumonia, reaction to medication, pulmonary aspiration, perforation of a viscus, bleeding, failure to diagnose a condition and creating a complication requiring transfusion or operation were discussed with the patient who freely signed the consent.    Description of Procedure: The patient was seen in the Preoperative Area, was examined and was deemed appropriate to proceed.  The patient was taken to [facility] Endoscopy room 3, identified as Denise [name] and the procedure verified as Flexible Video Fiberoptic Bronchoscopy with robotic assisted navigation and endobronchial ultrasound.  A Time Out was held and the above information confirmed.   Robotic assisted navigation: Prior to the date of the procedure a high-resolution CT scan of the chest was performed. Utilizing ION software program a virtual tracheobronchial tree was generated to allow the creation of distinct navigation pathways to the patient's parenchymal abnormalities. After being taken to the operating room general anesthesia was initiated and the patient  was orally  intubated. The video fiberoptic bronchoscope was introduced via the endotracheal tube and a general inspection was performed which showed normal right and left lung anatomy. Aspiration of the bilateral mainstems was completed to remove any remaining secretions. Robotic catheter inserted into patient's endotracheal tube.   Target #1 Left upper lobe: The distinct navigation pathways prepared prior to this procedure were then utilized to navigate to patient's lesion identified on CT scan. The robotic catheter was secured into place and the vision probe was withdrawn.  Lesion location was approximated using fluoroscopy.  Local registration and targeting was performed using [employer]ios mobile C-arm three-dimensional imaging. Unfortunately the patient had significant airway distrotion on the ion platform and the cone beam CT had significant atelectasis making the biopsy challanging.  Radial EBUS used to confirm lesion and a concentric view was obtained. Under fluoroscopic guidance, transbronchial needle biopsies.  A bronchioalveolar lavage was performed in the left upper lobe and sent for cytology and microbiology.     Endobronchial ultrasound: The robotic scope was then withdrawn and the endobronchial ultrasound was used to identify and characterize the peritracheal, hilar and bronchial lymph nodes. Inspection showed enlarged pathologic lymph nodes. Using real-time ultrasound guidance Wang needle biopsies were take from Station 11L,7 and 4R nodes and were sent for cytology.   At the end of the procedure a general airway inspection was performed and there was no evidence of active bleeding. The bronchoscope was removed.  The patient tolerated the procedure well. There was no significant blood loss and there were no obvious complications. A post-procedural chest x-ray is pending.  Samples Target #1:  2. Transbronchial Wang needle biopsies from left upper lobe nodule 4. Bronchoalveolar lavage  from left upper lobe   EBUS Samples: 1. Wang needle biopsies from 11L node 2. Wang needle biopsies from 7 node 3. Wang needle biopsies from 4R node  Zola Herter, MD 02/02/2024, 1:42 PM [facility]

## 2024-02-02 NOTE — Progress Notes (Signed)
 PT Cancellation Note  Patient Details Name: Denise [name] MRN: [id number] DOB: 1970/03/25   Cancelled Treatment:    Reason Eval/Treat Not Completed: Patient at procedure or test/unavailable   Kati L Payson 02/02/2024, 11:50 AM

## 2024-02-02 NOTE — Progress Notes (Signed)
" °   02/02/24 2120  BiPAP/CPAP/SIPAP  BiPAP/CPAP/SIPAP Pt Type Adult  BiPAP/CPAP/SIPAP Resmed  Mask Type Full face mask  Dentures removed? Not applicable  Mask Size Medium  Pressure Support 5 cmH20  Flow Rate 2 lpm  Patient Home Machine No  Patient Home Mask No  Patient Home Tubing No  Auto Titrate Yes  Minimum cmH2O 5 cmH2O  Maximum cmH2O 15 cmH2O  CPAP/SIPAP surface wiped down Yes  Device Plugged into RED Power Outlet Yes  BiPAP/CPAP /SiPAP Vitals  Pulse Rate 69  Resp 15  Bilateral Breath Sounds Clear;Diminished  MEWS Score/Color  MEWS Score 0  MEWS Score Color Green    "

## 2024-02-03 ENCOUNTER — Other Ambulatory Visit (HOSPITAL_COMMUNITY): Payer: Self-pay

## 2024-02-03 ENCOUNTER — Encounter (HOSPITAL_COMMUNITY): Payer: Self-pay

## 2024-02-03 LAB — GLUCOSE, CAPILLARY: Glucose-Capillary: 107 mg/dL — ABNORMAL HIGH (ref 70–99)

## 2024-02-03 MED ORDER — POLYETHYLENE GLYCOL 3350 17 GM/SCOOP PO POWD
17.0000 g | Freq: Every day | ORAL | 0 refills | Status: AC | PRN
  Filled 2024-02-03: qty 238, 14d supply, fill #0

## 2024-02-03 MED ORDER — SENNOSIDES-DOCUSATE SODIUM 8.6-50 MG PO TABS
1.0000 | ORAL_TABLET | Freq: Every evening | ORAL | 0 refills | Status: AC | PRN
  Filled 2024-02-03: qty 30, 30d supply, fill #0

## 2024-02-03 MED ORDER — PREDNISONE 20 MG PO TABS
40.0000 mg | ORAL_TABLET | Freq: Every day | ORAL | 0 refills | Status: AC
  Filled 2024-02-03: qty 14, 7d supply, fill #0

## 2024-02-03 MED ORDER — GABAPENTIN 300 MG PO CAPS: 600.0000 mg | ORAL_CAPSULE | Freq: Two times a day (BID) | ORAL | Status: AC

## 2024-02-03 NOTE — Progress Notes (Signed)
" °   02/03/24 0948  AVS Discharge Documentation  AVS Discharge Instructions Including Medications Provided to patient/caregiver  Name of Person Receiving AVS Discharge Instructions Including Medications Denise [name]  Name of Clinician That Reviewed AVS Discharge Instructions Including Medications Tayler Heiden   Pt denies any question on discharge instructions  "

## 2024-02-03 NOTE — Discharge Summary (Signed)
 Physician Discharge Summary  Denise [name] FMW:[id number] DOB: [birth year] DOA: 01/30/2024  PCP: Duwaine Annabella SAILOR, FNP  Admit date: 01/30/2024 Discharge date: 02/03/2024  Admitted From: Home Disposition: Home  Recommendations for Outpatient Follow-up:  Follow up with PCP in 1 week with repeat CBC/BMP Outpatient follow-up with pulmonary and oncology Comply with medications and follow-up Abstain from cocaine and illicit drug use Follow up in ED if symptoms worsen or new appear   Home Health: No Equipment/Devices: None  Discharge Condition: Stable CODE STATUS: Full Diet recommendation: Heart healthy  Brief/Interim Summary: 54 y.o. female past medical history of polysubstance abuse, essential hypertension, diabetes mellitus type 2, CVA, COPD, lung mass who was supposed to have outpatient bronchoscopy and biopsy but patient could not make it to the appointment.  She presented with rectal bleeding.  On presentation, she was hypoxic.  CT of the abdomen and pelvis showed left upper lobe mass with numerous bilateral pulmonary nodules and mediastinal lymphadenopathy along with peritoneal carcinomatosis.  She required BiPAP on presentation and was started on IV Solu-Medrol .  Pulmonary and oncology were consulted.  Oncology recommended outpatient follow-up.  During the hospitalization, her condition has improved.  She underwent bronchoscopy with endobronchial ultrasound and biopsies on 02/02/2024 by pulmonary.  Pulmonary has cleared the patient for discharge.  Pathology will need to be followed up as an outpatient.  She feels okay to go home today.  She will be discharged home today with outpatient follow-up with PCP/pulmonary/oncology.  Discharge Diagnoses:   Acute on chronic respiratory failure with hypoxia COPD exacerbation Possible community-acquired bacterial pneumonia: Bacteria unspecified OSA/OHS on CPAP Tobacco use Left upper lobe pulmonary mass along with numerous pulmonary nodules  and omental mass - Findings concerning for metastatic cancer.  Several outpatient bronchoscopy were scheduled but subsequently canceled because of no-show.  Pulmonary and oncology following.  Currently on 2 L oxygen via nasal cannula during daytime which she normally wears at home.  Continue CPAP at night.  Required BiPAP at night in the hospital. - She underwent bronchoscopy with endobronchial ultrasound and biopsies on 02/02/2024 by pulmonary.  Pulmonary has cleared the patient for discharge.  Pathology will need to be followed up as an outpatient.  She feels okay to go home today.  She will be discharged home today with outpatient follow-up with PCP/pulmonary/oncology. - Today is day #5 of Rocephin .  No need for any more antibiotics on discharge - Currently on Solu-Medrol .  Discharge home on oral prednisone  40 mg daily for 7 days.  Continue home inhaled regimen.    Hypertension -Continue amlodipine  and Lasix  along with lisinopril .   Leukocytosis -Resolved   Hypokalemia -Resolved   Acute metabolic acidosis - Resolved   Hypomagnesemia - No labs today   Diabetes mellitus type 2 - A1c 5.8.  Resume metformin .  Carb modified diet.  Outpatient follow-up with PCP  Obesity class III - Outpatient follow-up   Rectal bleeding - Resolved.  Hemoglobin stable.  Outpatient follow-up with GI if needed   Cognitive impairment - Noted.  Outpatient follow-up   Discharge Instructions  Discharge Instructions     Diet - low sodium heart healthy   Complete by: As directed    Increase activity slowly   Complete by: As directed    Pulmonary Visit   Complete by: As directed    Reason for referral: Lung Mass/Lung Nodule   Does the patients CT scan have any urgent finds? (lung mass (3+cm), suspected metastatic disease, nodules >65mm with high-risk characteristics such as spiculation, pleural tenting,  adenopathy, or non-calcified nodule >69mm): Yes      Allergies as of 02/03/2024        Reactions   Gadolinium Derivatives Nausea And Vomiting, Other (See Comments)   Nausea and vomiting was followed by epileptic seizure episode that lasted approximately 5 minutes; pt was unable to verbally communicate during that time; she then came to and was able to speak; was evaluated by Rad and RN; kms    Grapefruit Flavor [flavoring Agent (non-screening)] Other (See Comments)   Drug interaction        Medication List     STOP taking these medications    atorvastatin  20 MG tablet Commonly known as: LIPITOR   buPROPion  150 MG 12 hr tablet Commonly known as: WELLBUTRIN  SR   guaiFENesin  600 MG 12 hr tablet Commonly known as: MUCINEX    nicotine  14 mg/24hr patch Commonly known as: NICODERM CQ  - dosed in mg/24 hours   pantoprazole  40 MG tablet Commonly known as: PROTONIX    Systane Ultra 0.4-0.3 % Soln Generic drug: Polyethyl Glycol-Propyl Glycol       TAKE these medications    amLODipine  10 MG tablet Commonly known as: NORVASC  Take 10 mg by mouth daily.   Bayer Back & Body Pain Ex St 500-32.5 MG Tabs Generic drug: Aspirin -Caffeine  Take 2 tablets by mouth in the morning and at bedtime.   Breztri  Aerosphere 160-9-4.8 MCG/ACT Aero inhaler Generic drug: budesonide -glycopyrrolate -formoterol  Inhale 2 puffs into the lungs in the morning and at bedtime.   carbamazepine  200 MG tablet Commonly known as: TEGRETOL  Take 2 tablets (400 mg total) by mouth 2 (two) times daily.   furosemide  80 MG tablet Commonly known as: LASIX  TAKE 1 TABLET BY MOUTH EVERY DAY   gabapentin  300 MG capsule Commonly known as: NEURONTIN  Take 2 capsules (600 mg total) by mouth 2 (two) times daily.   latanoprost  0.005 % ophthalmic solution Commonly known as: XALATAN  Place 1 drop into both eyes at bedtime.   lisinopril  30 MG tablet Commonly known as: ZESTRIL  Take 30 mg by mouth daily.   metFORMIN  500 MG tablet Commonly known as: GLUCOPHAGE  Take 500 mg by mouth in the morning and at  bedtime.   Mucinex  Fast-Max Nght Cold/Flu 12.5-5-325 MG Tabs Generic drug: diphenhydrAMINE -PE-APAP Take 2 tablets by mouth at bedtime.   Olopatadine  HCl 0.2 % Soln Place 1 drop into both eyes daily.   OXYGEN Inhale 2 L/min into the lungs continuous.   polyethylene glycol 17 g packet Commonly known as: MIRALAX  / GLYCOLAX  Take 17 g by mouth daily as needed.   potassium chloride  SA 20 MEQ tablet Commonly known as: KLOR-CON  M Take 1 tablet (20 mEq total) by mouth daily.   predniSONE  20 MG tablet Commonly known as: DELTASONE  Take 2 tablets (40 mg total) by mouth daily with breakfast for 7 days. Start taking on: February 04, 2024   PRESCRIPTION MEDICATION CPAP - At bedtime and during any time of rest   senna-docusate 8.6-50 MG tablet Commonly known as: Senokot-S Take 1 tablet by mouth at bedtime as needed for mild constipation.   Ventolin  HFA 108 (90 Base) MCG/ACT inhaler Generic drug: albuterol  inhale 2 PUFFS into THE lungs EVERY 6 HOURS AS NEEDED FOR WHEEZING OR SHORTNESS OF BREATH        Follow-up Information     Duwaine Annabella SAILOR, FNP. Schedule an appointment as soon as possible for a visit in 1 week(s).   Specialty: Family Medicine Contact information: [address] [place] [address] [phone]  Sherrod Sherrod, MD. Schedule an appointment as soon as possible for a visit in 1 week(s).   Specialty: Oncology Contact information: [address] [place] [address] [phone]                Allergies[1]  Consultations: Pulmonary/oncology   Procedures/Studies: [facility] Chest Port 1 View Result Date: 02/02/2024 CLINICAL DATA:  Status post bronchoscopy EXAM: PORTABLE CHEST 1 VIEW COMPARISON:  January 30, 2024 FINDINGS: Stable cardiomegaly. Left suprahilar nodular density is again noted concerning for metastatic disease as noted on prior CT scan. Other pulmonary nodules are not well visualized on this radiograph. Bony thorax is  unremarkable. IMPRESSION: Left suprahilar nodular density is again noted concerning for metastatic disease. No pneumothorax is noted. Electronically Signed   By: Lynwood Landy Raddle M.D.   On: 02/02/2024 14:26   DG C-ARM BRONCHOSCOPY Result Date: 02/02/2024 C-ARM BRONCHOSCOPY: Fluoroscopy was utilized by the requesting physician.  No radiographic interpretation.   DG C-Arm 1-60 Min-No Report Result Date: 02/02/2024 Fluoroscopy was utilized by the requesting physician.  No radiographic interpretation.   CT CHEST ABDOMEN PELVIS WO CONTRAST Result Date: 01/31/2024 EXAM: CT CHEST, ABDOMEN AND PELVIS WITHOUT CONTRAST 01/31/2024 01:39:32 AM TECHNIQUE: CT of the chest, abdomen and pelvis was performed without the administration of intravenous contrast. Multiplanar reformatted images are provided for review. Automated exposure control, iterative reconstruction, and/or weight based adjustment of the mA/kV was utilized to reduce the radiation dose to as low as reasonably achievable. COMPARISON: None available. CLINICAL HISTORY: Respiratory illness, nondiagnostic xray. FINDINGS: CHEST: MEDIASTINUM AND LYMPH NODES: Heart and pericardium are unremarkable. The central airways are clear. Prevascular window adenopathy along with right paratracheal and subcarinal adenopathy again noted, unchanged. No axillary adenopathy. Difficult to assess the hila without intravenous contrast. LUNGS AND PLEURA: Posterior left upper lobe nodule measures 2.1 x 1.9 cm compared to 1.9 cm maximally on prior study. Numerous bilateral 5 to 10 mm pulmonary nodules on prior study and compatible with metastatic disease. No focal consolidation or pulmonary edema. No pleural effusion. No pneumothorax. ABDOMEN AND PELVIS: LIVER: Unremarkable. GALLBLADDER AND BILE DUCTS: Unremarkable. No biliary ductal dilatation. SPLEEN: No acute abnormality. PANCREAS: No acute abnormality. ADRENAL GLANDS: No acute abnormality. KIDNEYS, URETERS AND BLADDER: No stones in the  kidneys or ureters. No hydronephrosis. No perinephric or periureteral stranding. Urinary bladder is unremarkable. GI AND BOWEL: Stomach demonstrates no acute abnormality. Scattered colonic diverticulosis. No active diverticulitis. There is no bowel obstruction. REPRODUCTIVE ORGANS: Calcified fibroid in the uterine fundus, stable. No adnexal mass. PERITONEUM AND RETROPERITONEUM: Nodularity in the omentum and peritoneum, worsening since prior study. Conglomerate omental mass in the left lower quadrant measures up to 6.4 cm. Stable 3 cm water density cystic structure in the retroperitoneum adjacent to the distal aorta and IVC compatible with a benign process. No ascites. No free air. VASCULATURE: Aorta is normal in caliber. Scattered aortic atherosclerosis. ABDOMINAL AND PELVIS LYMPH NODES: No lymphadenopathy. BONES AND SOFT TISSUES: No acute osseous abnormality. No focal soft tissue abnormality. IMPRESSION: 1. Posterior left upper lobe nodule measures 2.1 x 1.9 cm, increased from 1.9 cm on prior study. This may represent the primary malignancy. 2. Numerous bilateral 5 to 10 mm pulmonary nodules, compatible with metastatic disease. 3. Mediastinal adenopathy, unchanged. 4. Nodularity in the omentum and peritoneum, worsening since prior study, with a conglomerate omental mass in the left lower quadrant measuring up to 6.4 cm compatible with metastatic disease. Electronically signed by: Franky Crease MD 01/31/2024 02:02 AM EST RP Workstation: [id number]  DG Chest Port 1 View Result Date: 01/30/2024 EXAM: 1 VIEW(S) XRAY OF THE CHEST 01/30/2024 09:23:00 PM COMPARISON: Portable chest 01/10/2024. CTA chest 11/30/2023. CLINICAL HISTORY: dyspnea FINDINGS: LINES, TUBES AND DEVICES: Overlying monitor leads. LUNGS AND PLEURA: There are numerous pulmonary nodules throughout the lung fields. Findings consistent with widespread pulmonary metastases. This was better seen on CT as well as an approximately 3 cm left upper lobe presumed  primary malignancy superimposing adjacent the aortic knob. There is soft tissue fullness along both hilar silhouettes and AP window, consistent with adenopathy. This was seen previously and may have slightly progressed. No focal pneumonia is evident. No pleural effusion. No pneumothorax. HEART AND MEDIASTINUM: Cardiomegaly is stable. No vascular congestion is seen. There is a mildly tortuous aorta with atherosclerosis. BONES AND SOFT TISSUES: No acute osseous abnormality. IMPRESSION: 1. Stable cardiomegaly without vascular congestion or focal pneumonia. 2. Widespread pulmonary metastases with approximately 3 cm left upper lobe presumed primary malignancy partially visible, and hilar/AP window adenopathy possibly slightly progressed. Electronically signed by: Francis Quam MD 01/30/2024 09:34 PM EST RP Workstation: [id number]   DG Chest Port 1 View Result Date: 01/10/2024 CLINICAL DATA:  Shortness of breath, chest pain EXAM: PORTABLE CHEST 1 VIEW COMPARISON:  November 30, 2023 FINDINGS: The heart size and mediastinal contours are within normal limits. Both lungs are clear. The visualized skeletal structures are unremarkable. IMPRESSION: No active disease. Electronically Signed   By: Lynwood Landy Raddle M.D.   On: 01/10/2024 10:34      Subjective: Patient seen and examined at bedside.  Feels better and wants to go home today.  No fever, vomiting, abdominal pain reported.  Discharge Exam: Vitals:   02/03/24 0800 02/03/24 0900  BP: (!) 134/59 (!) 120/51  Pulse: 75 65  Resp: 19 17  Temp:    SpO2: 94% 96%    General: Pt is alert, awake, not in acute distress.  On 2 L oxygen via nasal cannula.  Looks chronically ill and deconditioned. Cardiovascular: rate controlled, S1/S2 + Respiratory: bilateral decreased breath sounds at bases with scattered crackles Abdominal: Soft, morbidly obese, NT, ND, bowel sounds + Extremities: Trace lower extremity edema; no cyanosis    The results of significant  diagnostics from this hospitalization (including imaging, microbiology, ancillary and laboratory) are listed below for reference.     Microbiology: Recent Results (from the past 240 hours)  Resp panel by RT-PCR (RSV, Flu A&B, Covid) Anterior Nasal Swab     Status: None   Collection Time: 01/30/24  8:55 PM   Specimen: Anterior Nasal Swab  Result Value Ref Range Status   SARS Coronavirus 2 by RT PCR NEGATIVE NEGATIVE Final    Comment: (NOTE) SARS-CoV-2 target nucleic acids are NOT DETECTED.  The SARS-CoV-2 RNA is generally detectable in upper respiratory specimens during the acute phase of infection. The lowest concentration of SARS-CoV-2 viral copies this assay can detect is 138 copies/mL. A negative result does not preclude SARS-Cov-2 infection and should not be used as the sole basis for treatment or other patient management decisions. A negative result may occur with  improper specimen collection/handling, submission of specimen other than nasopharyngeal swab, presence of viral mutation(s) within the areas targeted by this assay, and inadequate number of viral copies(<138 copies/mL). A negative result must be combined with clinical observations, patient history, and epidemiological information. The expected result is Negative.  Fact Sheet for Patients:  bloggercourse.com  Fact Sheet for Healthcare Providers:  seriousbroker.it  This test is no t yet approved or  cleared by the [place]  FDA and  has been authorized for detection and/or diagnosis of SARS-CoV-2 by FDA under an Emergency Use Authorization (EUA). This EUA will remain  in effect (meaning this test can be used) for the duration of the COVID-19 declaration under Section 564(b)(1) of the Act, 21 U.S.C.section 360bbb-3(b)(1), unless the authorization is terminated  or revoked sooner.       Influenza A by PCR NEGATIVE NEGATIVE Final   Influenza B by PCR NEGATIVE  NEGATIVE Final    Comment: (NOTE) The Xpert Xpress SARS-CoV-2/FLU/RSV plus assay is intended as an aid in the diagnosis of influenza from Nasopharyngeal swab specimens and should not be used as a sole basis for treatment. Nasal washings and aspirates are unacceptable for Xpert Xpress SARS-CoV-2/FLU/RSV testing.  Fact Sheet for Patients: bloggercourse.com  Fact Sheet for Healthcare Providers: seriousbroker.it  This test is not yet approved or cleared by the [place]  FDA and has been authorized for detection and/or diagnosis of SARS-CoV-2 by FDA under an Emergency Use Authorization (EUA). This EUA will remain in effect (meaning this test can be used) for the duration of the COVID-19 declaration under Section 564(b)(1) of the Act, 21 U.S.C. section 360bbb-3(b)(1), unless the authorization is terminated or revoked.     Resp Syncytial Virus by PCR NEGATIVE NEGATIVE Final    Comment: (NOTE) Fact Sheet for Patients: bloggercourse.com  Fact Sheet for Healthcare Providers: seriousbroker.it  This test is not yet approved or cleared by the [place]  FDA and has been authorized for detection and/or diagnosis of SARS-CoV-2 by FDA under an Emergency Use Authorization (EUA). This EUA will remain in effect (meaning this test can be used) for the duration of the COVID-19 declaration under Section 564(b)(1) of the Act, 21 U.S.C. section 360bbb-3(b)(1), unless the authorization is terminated or revoked.  Performed at [facility], [address], [place], [place] [address]   MRSA Next Gen by PCR, Nasal     Status: None   Collection Time: 01/31/24  2:28 AM   Specimen: Nasal Mucosa; Nasal Swab  Result Value Ref Range Status   MRSA by PCR Next Gen NOT DETECTED NOT DETECTED Final    Comment: (NOTE) The GeneXpert MRSA Assay (FDA approved for NASAL specimens only), is  one component of a comprehensive MRSA colonization surveillance program. It is not intended to diagnose MRSA infection nor to guide or monitor treatment for MRSA infections. Test performance is not FDA approved in patients less than 31 years old. Performed at [facility], [address] [place], [place] [address]      Labs: BNP (last 3 results) Recent Labs    02/17/23 1649  BNP 37.3   Basic Metabolic Panel: Recent Labs  Lab 01/30/24 2055 01/31/24 0724  NA 135 135  K 3.4* 4.5  CL 96* 98  CO2 18* 25  GLUCOSE 103* 127*  BUN 7 6  CREATININE 0.70 0.60  CALCIUM  9.5 10.0  MG 1.6*  --    Liver Function Tests: Recent Labs  Lab 01/30/24 2055 01/31/24 0724  AST 17 15  ALT 11 9  ALKPHOS 148* 147*  BILITOT <0.2 <0.2  PROT 7.0 7.1  ALBUMIN 4.3 4.4   No results for input(s): LIPASE, AMYLASE in the last 168 hours. No results for input(s): AMMONIA in the last 168 hours. CBC: Recent Labs  Lab 01/30/24 2055 01/31/24 0724 01/31/24 1217  WBC 12.0* 8.1 9.6  NEUTROABS 6.8  --   --   HGB 13.0  13.1 13.4  HCT 38.3 38.9 39.7  MCV 88.7 89.2 89.6  PLT 342 371 346   Cardiac Enzymes: No results for input(s): CKTOTAL, CKMB, CKMBINDEX, TROPONINI in the last 168 hours. BNP: Invalid input(s): POCBNP CBG: Recent Labs  Lab 02/02/24 0728 02/02/24 1032 02/02/24 1653 02/02/24 2123 02/03/24 0756  GLUCAP 112* 130* 174* 125* 107*   D-Dimer No results for input(s): DDIMER in the last 72 hours. Hgb A1c No results for input(s): HGBA1C in the last 72 hours. Lipid Profile No results for input(s): CHOL, HDL, LDLCALC, TRIG, CHOLHDL, LDLDIRECT in the last 72 hours. Thyroid  function studies No results for input(s): TSH, T4TOTAL, T3FREE, THYROIDAB in the last 72 hours.  Invalid input(s): FREET3 Anemia work up No results for input(s): VITAMINB12, FOLATE, FERRITIN, TIBC, IRON, RETICCTPCT in the last 72  hours. Urinalysis    Component Value Date/Time   COLORURINE STRAW (A) 11/30/2023 1239   APPEARANCEUR CLEAR 11/30/2023 1239   LABSPEC 1.017 11/30/2023 1239   PHURINE 5.0 11/30/2023 1239   GLUCOSEU NEGATIVE 11/30/2023 1239   HGBUR NEGATIVE 11/30/2023 1239   BILIRUBINUR NEGATIVE 11/30/2023 1239   KETONESUR NEGATIVE 11/30/2023 1239   PROTEINUR NEGATIVE 11/30/2023 1239   UROBILINOGEN 0.2 11/22/2014 0240   NITRITE NEGATIVE 11/30/2023 1239   LEUKOCYTESUR NEGATIVE 11/30/2023 1239   Sepsis Labs Recent Labs  Lab 01/30/24 2055 01/31/24 0724 01/31/24 1217  WBC 12.0* 8.1 9.6   Microbiology Recent Results (from the past 240 hours)  Resp panel by RT-PCR (RSV, Flu A&B, Covid) Anterior Nasal Swab     Status: None   Collection Time: 01/30/24  8:55 PM   Specimen: Anterior Nasal Swab  Result Value Ref Range Status   SARS Coronavirus 2 by RT PCR NEGATIVE NEGATIVE Final    Comment: (NOTE) SARS-CoV-2 target nucleic acids are NOT DETECTED.  The SARS-CoV-2 RNA is generally detectable in upper respiratory specimens during the acute phase of infection. The lowest concentration of SARS-CoV-2 viral copies this assay can detect is 138 copies/mL. A negative result does not preclude SARS-Cov-2 infection and should not be used as the sole basis for treatment or other patient management decisions. A negative result may occur with  improper specimen collection/handling, submission of specimen other than nasopharyngeal swab, presence of viral mutation(s) within the areas targeted by this assay, and inadequate number of viral copies(<138 copies/mL). A negative result must be combined with clinical observations, patient history, and epidemiological information. The expected result is Negative.  Fact Sheet for Patients:  bloggercourse.com  Fact Sheet for Healthcare Providers:  seriousbroker.it  This test is no t yet approved or cleared by the [place]  [place] FDA and  has been authorized for detection and/or diagnosis of SARS-CoV-2 by FDA under an Emergency Use Authorization (EUA). This EUA will remain  in effect (meaning this test can be used) for the duration of the COVID-19 declaration under Section 564(b)(1) of the Act, 21 U.S.C.section 360bbb-3(b)(1), unless the authorization is terminated  or revoked sooner.       Influenza A by PCR NEGATIVE NEGATIVE Final   Influenza B by PCR NEGATIVE NEGATIVE Final    Comment: (NOTE) The Xpert Xpress SARS-CoV-2/FLU/RSV plus assay is intended as an aid in the diagnosis of influenza from Nasopharyngeal swab specimens and should not be used as a sole basis for treatment. Nasal washings and aspirates are unacceptable for Xpert Xpress SARS-CoV-2/FLU/RSV testing.  Fact Sheet for Patients: bloggercourse.com  Fact Sheet for Healthcare Providers: seriousbroker.it  This test is not yet approved or cleared by  the [place]  FDA and has been authorized for detection and/or diagnosis of SARS-CoV-2 by FDA under an Emergency Use Authorization (EUA). This EUA will remain in effect (meaning this test can be used) for the duration of the COVID-19 declaration under Section 564(b)(1) of the Act, 21 U.S.C. section 360bbb-3(b)(1), unless the authorization is terminated or revoked.     Resp Syncytial Virus by PCR NEGATIVE NEGATIVE Final    Comment: (NOTE) Fact Sheet for Patients: bloggercourse.com  Fact Sheet for Healthcare Providers: seriousbroker.it  This test is not yet approved or cleared by the [place]  FDA and has been authorized for detection and/or diagnosis of SARS-CoV-2 by FDA under an Emergency Use Authorization (EUA). This EUA will remain in effect (meaning this test can be used) for the duration of the COVID-19 declaration under Section 564(b)(1) of the Act, 21 U.S.C. section  360bbb-3(b)(1), unless the authorization is terminated or revoked.  Performed at [facility], [address], [place], [place] [address]   MRSA Next Gen by PCR, Nasal     Status: None   Collection Time: 01/31/24  2:28 AM   Specimen: Nasal Mucosa; Nasal Swab  Result Value Ref Range Status   MRSA by PCR Next Gen NOT DETECTED NOT DETECTED Final    Comment: (NOTE) The GeneXpert MRSA Assay (FDA approved for NASAL specimens only), is one component of a comprehensive MRSA colonization surveillance program. It is not intended to diagnose MRSA infection nor to guide or monitor treatment for MRSA infections. Test performance is not FDA approved in patients less than 52 years old. Performed at [facility], [address] [place], [place] [address]      Time coordinating discharge: 35 minutes  SIGNED:   Sophie Mao, MD  [facility] 02/03/2024, 9:41 AM      [1]  Allergies Allergen Reactions   Gadolinium Derivatives Nausea And Vomiting and Other (See Comments)    Nausea and vomiting was followed by epileptic seizure episode that lasted approximately 5 minutes; pt was unable to verbally communicate during that time; she then came to and was able to speak; was evaluated by Rad and RN; kms    Grapefruit Flavor [Flavoring Agent (Non-Screening)] Other (See Comments)    Drug interaction

## 2024-02-03 NOTE — Progress Notes (Signed)
 Discharge meds in a secure bag delivered to patient by this RN

## 2024-02-06 LAB — CYTOLOGY - NON PAP

## 2024-02-07 ENCOUNTER — Telehealth: Payer: Self-pay | Admitting: Internal Medicine

## 2024-02-07 NOTE — Telephone Encounter (Signed)
 Called pt to sched her follow up appt she requested to call back later at 11am

## 2024-02-07 NOTE — Progress Notes (Signed)
 Request for pt's tissue be sent to [facility] medicine for molecular studies emailed to Cheril Fuse, [facility] Path technician.

## 2024-02-07 NOTE — Telephone Encounter (Signed)
 Called pt and she is aware of the upcoming appt sched on 1/19

## 2024-02-08 ENCOUNTER — Ambulatory Visit: Admitting: Acute Care

## 2024-02-08 ENCOUNTER — Other Ambulatory Visit: Payer: Self-pay | Admitting: Pulmonary Disease

## 2024-02-08 ENCOUNTER — Encounter: Payer: Self-pay | Admitting: Acute Care

## 2024-02-08 DIAGNOSIS — C3490 Malignant neoplasm of unspecified part of unspecified bronchus or lung: Secondary | ICD-10-CM | POA: Diagnosis not present

## 2024-02-08 DIAGNOSIS — F17210 Nicotine dependence, cigarettes, uncomplicated: Secondary | ICD-10-CM

## 2024-02-08 DIAGNOSIS — R9389 Abnormal findings on diagnostic imaging of other specified body structures: Secondary | ICD-10-CM

## 2024-02-08 DIAGNOSIS — C3492 Malignant neoplasm of unspecified part of left bronchus or lung: Secondary | ICD-10-CM

## 2024-02-08 DIAGNOSIS — R911 Solitary pulmonary nodule: Secondary | ICD-10-CM

## 2024-02-08 DIAGNOSIS — C779 Secondary and unspecified malignant neoplasm of lymph node, unspecified: Secondary | ICD-10-CM | POA: Diagnosis not present

## 2024-02-08 DIAGNOSIS — F17200 Nicotine dependence, unspecified, uncomplicated: Secondary | ICD-10-CM

## 2024-02-08 NOTE — Patient Instructions (Addendum)
 It was good to talk with your today. We offered you an acute appointment today, but you declined.  I am glad you are not having any bleeding of breathing issues after the bronchoscopy with biopsies.  We have reviewed your biopsy results. The biopsy of your lymph nodes was positive for adenocarcinoma of the lung. You need treatment to ensure you have the best outcome possible. You have an appointment with Dr. Sherrod in oncology 02/09/2024 at 2:45 pm. You need to make sure you go to this appointment. Please call for transportation today.  Follow up with Dr. Sherrod as is scheduled Follow up with Dr. Neda 02/09/2024, with CXR at that visit. PET scan and MR Brain ordered.   You will get calls to get these appointments scheduled.  Please contact office for sooner follow up if symptoms do not improve or worsen or seek emergency care.

## 2024-02-08 NOTE — Progress Notes (Signed)
 Telephone via Video Note  I connected with Denise [name] on 02/08/2024 at  9:00 AM EST by a video enabled telemedicine application and verified that I am speaking with the correct person using two identifiers.  Location: Patient:  At home in [facility] Provider: [address], [place], [place], [address]    I discussed the limitations of evaluation and management by telemedicine and the availability of in person appointments. The patient expressed understanding and agreed to proceed.  History of Present Illness: Pt. Presents for follow up after bronchoscopy with biopsies. This was set up as a video visit. Pt. Refused to click on the link for the visit. She hung up on nursing staff. I had to call her back to complete a tele visit.   Pt. States she does not feel well. She refused an acute appointment today. She is scheduled to see Dr. Neda  tomorrow, and stated she would come to that visit as she has transportation arranged. I will order a CXR for tomorrow while she is here.  She is wearing 2 L [place]. She is speaking in full sentences. She does not have an oxygen saturation monitor. She states she is unaware if she has a fever. She denies any hemoptysis, no cough, she denied any adverse reaction to anesthesia.   While I hated giving the patient her cancer diagnosis over the phone, I had no other option. We discussed that the biopsies of her Left Upper Lobe Lung Nodule, and several lymph nodes were positive for adenocarcinoma. I explained that we need to make sure she goes to her scheduled visit with oncology 02/13/2024. She verbalized that she will call to arrange transportation for the visit Monday. I explained that we need to make sure she gets treatment for her cancer, and that she needs to make sure she goes to this appointment.   She stated that she will make arrangements today. She had no further questions. She appeared very confused throughout the visit.   I will let Dr. Neda know I  have ordered a CXR for tomorrow as a stat to make sure she does not need treatment for any acute issue.   She is scheduled to see Dr. Sherrod 02/13/2024.She was incoherent through several points in the televisit. ( 01/30/2024 ED visit she stated she was using crack ). She refused an acute visit. I did tell her to seek emergency care if she did not improve.    Observations/Objective: Cytology 02/02/2024 A. LUNG, LUL TARGET #1, FINE NEEDLE ASPIRATION:  - Atypical cells present   B. LUNG, LUL, LAVAGE:    FINAL MICROSCOPIC DIAGNOSIS:  - No malignant cells identified  - Benign bronchial cells and pulmonary macrophages   SPECIMEN ADEQUACY  A. Satisfactory but limited for Evaluation due to scant cells   C. LYMPH NODE, 11L, FINE NEEDLE ASPIRATION:  - Adenocarcinoma of lung primary   D. LYMPH NODE, 7, FINE NEEDLE ASPIRATION:  - Adenocarcinoma of lung primary   E. 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  Current every day smoker  Recreational drug use Plan Follow up with Dr. Sherrod as is scheduled Follow up with Dr. Neda 02/09/2024, with CXR at that visit. PET scan and MR Brain ordered.   Please contact office for sooner follow up if symptoms do not improve or worsen or seek emergency care    Follow Up  Instructions: Follow up with Dr. Sherrod as is scheduled Follow up with Dr. Neda 02/09/2024, with CXR at that visit. PET scan and MR Brain ordered.   Please contact office for sooner follow up if symptoms do not improve or worsen or seek emergency care     I discussed the assessment and treatment plan with the patient. The patient was provided an opportunity to ask questions and all were answered. The patient agreed with the plan and demonstrated an understanding of the instructions.   The patient was advised to call back or seek an in-person  evaluation if the symptoms worsen or if the condition fails to improve as anticipated.  I provided 35 minutes of non-face-to-face time during this encounter.   Lauraine JULIANNA Lites, NP  02/08/2024

## 2024-02-09 ENCOUNTER — Other Ambulatory Visit: Payer: Self-pay

## 2024-02-09 ENCOUNTER — Ambulatory Visit: Admitting: Pulmonary Disease

## 2024-02-09 NOTE — Progress Notes (Signed)
 The proposed treatment discussed in conference is for discussion purpose only and is not a binding recommendation.  The patients have not been physically examined, or presented with their treatment options.  Therefore, final treatment plans cannot be decided.

## 2024-02-10 ENCOUNTER — Other Ambulatory Visit: Payer: Self-pay

## 2024-02-10 DIAGNOSIS — R918 Other nonspecific abnormal finding of lung field: Secondary | ICD-10-CM

## 2024-02-10 NOTE — Result Encounter Note (Signed)
 Called pt, she verbalized understanding. No further actions needed at this time.

## 2024-02-13 ENCOUNTER — Inpatient Hospital Stay: Attending: Internal Medicine | Admitting: Internal Medicine

## 2024-02-13 ENCOUNTER — Encounter (HOSPITAL_COMMUNITY): Payer: Self-pay

## 2024-02-13 ENCOUNTER — Inpatient Hospital Stay

## 2024-02-13 ENCOUNTER — Inpatient Hospital Stay: Admitting: Licensed Clinical Social Worker

## 2024-02-13 DIAGNOSIS — R918 Other nonspecific abnormal finding of lung field: Secondary | ICD-10-CM

## 2024-02-14 ENCOUNTER — Emergency Department (HOSPITAL_COMMUNITY)

## 2024-02-14 ENCOUNTER — Encounter (HOSPITAL_COMMUNITY): Payer: Self-pay

## 2024-02-14 ENCOUNTER — Observation Stay (HOSPITAL_COMMUNITY)
Admission: EM | Admit: 2024-02-14 | Discharge: 2024-02-17 | Disposition: A | Discharge status: Home / Self Care | Attending: Family Medicine | Admitting: Family Medicine

## 2024-02-14 ENCOUNTER — Inpatient Hospital Stay (HOSPITAL_COMMUNITY)

## 2024-02-14 ENCOUNTER — Other Ambulatory Visit: Payer: Self-pay

## 2024-02-14 DIAGNOSIS — Z8673 Personal history of transient ischemic attack (TIA), and cerebral infarction without residual deficits: Secondary | ICD-10-CM | POA: Insufficient documentation

## 2024-02-14 DIAGNOSIS — F319 Bipolar disorder, unspecified: Secondary | ICD-10-CM | POA: Diagnosis not present

## 2024-02-14 DIAGNOSIS — Z79899 Other long term (current) drug therapy: Secondary | ICD-10-CM | POA: Insufficient documentation

## 2024-02-14 DIAGNOSIS — F1490 Cocaine use, unspecified, uncomplicated: Secondary | ICD-10-CM | POA: Diagnosis not present

## 2024-02-14 DIAGNOSIS — E119 Type 2 diabetes mellitus without complications: Secondary | ICD-10-CM | POA: Insufficient documentation

## 2024-02-14 DIAGNOSIS — I5032 Chronic diastolic (congestive) heart failure: Secondary | ICD-10-CM | POA: Insufficient documentation

## 2024-02-14 DIAGNOSIS — I2699 Other pulmonary embolism without acute cor pulmonale: Secondary | ICD-10-CM | POA: Diagnosis present

## 2024-02-14 DIAGNOSIS — Z7984 Long term (current) use of oral hypoglycemic drugs: Secondary | ICD-10-CM | POA: Insufficient documentation

## 2024-02-14 DIAGNOSIS — I2694 Multiple subsegmental pulmonary emboli without acute cor pulmonale: Principal | ICD-10-CM | POA: Insufficient documentation

## 2024-02-14 DIAGNOSIS — K922 Gastrointestinal hemorrhage, unspecified: Secondary | ICD-10-CM | POA: Insufficient documentation

## 2024-02-14 DIAGNOSIS — Z23 Encounter for immunization: Secondary | ICD-10-CM | POA: Diagnosis not present

## 2024-02-14 DIAGNOSIS — G629 Polyneuropathy, unspecified: Secondary | ICD-10-CM | POA: Diagnosis not present

## 2024-02-14 DIAGNOSIS — C3490 Malignant neoplasm of unspecified part of unspecified bronchus or lung: Secondary | ICD-10-CM | POA: Insufficient documentation

## 2024-02-14 DIAGNOSIS — I110 Hypertensive heart disease with heart failure: Secondary | ICD-10-CM | POA: Diagnosis not present

## 2024-02-14 DIAGNOSIS — J449 Chronic obstructive pulmonary disease, unspecified: Secondary | ICD-10-CM | POA: Diagnosis not present

## 2024-02-14 DIAGNOSIS — R079 Chest pain, unspecified: Secondary | ICD-10-CM | POA: Diagnosis present

## 2024-02-14 DIAGNOSIS — F17210 Nicotine dependence, cigarettes, uncomplicated: Secondary | ICD-10-CM | POA: Insufficient documentation

## 2024-02-14 DIAGNOSIS — J9611 Chronic respiratory failure with hypoxia: Secondary | ICD-10-CM | POA: Insufficient documentation

## 2024-02-14 DIAGNOSIS — E875 Hyperkalemia: Secondary | ICD-10-CM | POA: Insufficient documentation

## 2024-02-14 LAB — CBC
HCT: 42.9 % (ref 36.0–46.0)
Hemoglobin: 14.5 g/dL (ref 12.0–15.0)
MCH: 30.8 pg (ref 26.0–34.0)
MCHC: 33.8 g/dL (ref 30.0–36.0)
MCV: 91.1 fL (ref 80.0–100.0)
Platelets: 270 K/uL (ref 150–400)
RBC: 4.71 MIL/uL (ref 3.87–5.11)
RDW: 14.2 % (ref 11.5–15.5)
WBC: 11.9 K/uL — ABNORMAL HIGH (ref 4.0–10.5)
nRBC: 0 % (ref 0.0–0.2)

## 2024-02-14 LAB — I-STAT CHEM 8, ED
BUN: 6 mg/dL (ref 6–20)
Calcium, Ion: 1.1 mmol/L — ABNORMAL LOW (ref 1.15–1.40)
Chloride: 98 mmol/L (ref 98–111)
Creatinine, Ser: 0.7 mg/dL (ref 0.44–1.00)
Glucose, Bld: 107 mg/dL — ABNORMAL HIGH (ref 70–99)
HCT: 43 % (ref 36.0–46.0)
Hemoglobin: 14.6 g/dL (ref 12.0–15.0)
Potassium: 5.8 mmol/L — ABNORMAL HIGH (ref 3.5–5.1)
Sodium: 134 mmol/L — ABNORMAL LOW (ref 135–145)
TCO2: 30 mmol/L (ref 22–32)

## 2024-02-14 LAB — BASIC METABOLIC PANEL WITH GFR
Anion gap: 13 (ref 5–15)
BUN: 5 mg/dL — ABNORMAL LOW (ref 6–20)
CO2: 25 mmol/L (ref 22–32)
Calcium: 9.7 mg/dL (ref 8.9–10.3)
Chloride: 98 mmol/L (ref 98–111)
Creatinine, Ser: 0.63 mg/dL (ref 0.44–1.00)
GFR, Estimated: >60 mL/min
Glucose, Bld: 112 mg/dL — ABNORMAL HIGH (ref 70–99)
Potassium: 4.1 mmol/L (ref 3.5–5.1)
Sodium: 136 mmol/L (ref 135–145)

## 2024-02-14 LAB — HEPATIC FUNCTION PANEL
ALT: 14 U/L (ref 0–44)
AST: 23 U/L (ref 15–41)
Albumin: 4.3 g/dL (ref 3.5–5.0)
Alkaline Phosphatase: 165 U/L — ABNORMAL HIGH (ref 38–126)
Bilirubin, Direct: 0.1 mg/dL (ref 0.0–0.2)
Indirect Bilirubin: 0.3 mg/dL (ref 0.3–0.9)
Total Bilirubin: 0.4 mg/dL (ref 0.0–1.2)
Total Protein: 7.1 g/dL (ref 6.5–8.1)

## 2024-02-14 LAB — CBG MONITORING, ED
Glucose-Capillary: 106 mg/dL — ABNORMAL HIGH (ref 70–99)
Glucose-Capillary: 126 mg/dL — ABNORMAL HIGH (ref 70–99)
Glucose-Capillary: 114 mg/dL — ABNORMAL HIGH (ref 70–99)

## 2024-02-14 LAB — PROTIME-INR
INR: 0.9 (ref 0.8–1.2)
Prothrombin Time: 13.2 s (ref 11.4–15.2)

## 2024-02-14 LAB — LIPASE, BLOOD: Lipase: 17 U/L (ref 11–51)

## 2024-02-14 LAB — HEPARIN LEVEL (UNFRACTIONATED): Heparin Unfractionated: 0.98 [IU]/mL — ABNORMAL HIGH (ref 0.30–0.70)

## 2024-02-14 LAB — TROPONIN T, HIGH SENSITIVITY: Troponin T High Sensitivity: 34 ng/L — ABNORMAL HIGH (ref 0–19)

## 2024-02-14 MED ORDER — SODIUM CHLORIDE 0.9 % IV BOLUS
500.0000 mL | Freq: Once | INTRAVENOUS | Status: AC
  Administered 2024-02-14: 500 mL via INTRAVENOUS

## 2024-02-14 MED ORDER — ACETAMINOPHEN 325 MG PO TABS
650.0000 mg | ORAL_TABLET | Freq: Four times a day (QID) | ORAL | Status: DC | PRN
  Administered 2024-02-15 – 2024-02-17 (×3): 650 mg via ORAL
  Filled 2024-02-14 (×5): qty 2

## 2024-02-14 MED ORDER — METOCLOPRAMIDE HCL 5 MG/ML IJ SOLN: 10.0000 mg | Freq: Four times a day (QID) | INTRAMUSCULAR | Status: DC | PRN

## 2024-02-14 MED ORDER — LISINOPRIL 20 MG PO TABS
30.0000 mg | ORAL_TABLET | Freq: Every day | ORAL | Status: DC
  Administered 2024-02-14 – 2024-02-17 (×4): 30 mg via ORAL
  Filled 2024-02-14: qty 1
  Filled 2024-02-14: qty 3
  Filled 2024-02-14: qty 1
  Filled 2024-02-14: qty 3

## 2024-02-14 MED ORDER — ACETAMINOPHEN 650 MG RE SUPP: 650.0000 mg | Freq: Four times a day (QID) | RECTAL | Status: DC | PRN

## 2024-02-14 MED ORDER — TRAZODONE HCL 50 MG PO TABS
25.0000 mg | ORAL_TABLET | Freq: Every evening | ORAL | Status: DC | PRN
  Administered 2024-02-15 – 2024-02-16 (×2): 25 mg via ORAL
  Filled 2024-02-14 (×2): qty 1

## 2024-02-14 MED ORDER — INSULIN ASPART 100 UNIT/ML IJ SOLN
0.0000 [IU] | Freq: Three times a day (TID) | INTRAMUSCULAR | Status: DC
  Administered 2024-02-14 – 2024-02-15 (×4): 2 [IU] via SUBCUTANEOUS
  Filled 2024-02-14 (×4): qty 2

## 2024-02-14 MED ORDER — HEPARIN (PORCINE) 25000 UT/250ML-% IV SOLN
1550.0000 [IU]/h | INTRAVENOUS | Status: DC
  Administered 2024-02-14: 1550 [IU]/h via INTRAVENOUS
  Filled 2024-02-14: qty 250

## 2024-02-14 MED ORDER — HEPARIN (PORCINE) 25000 UT/250ML-% IV SOLN
1300.0000 [IU]/h | INTRAVENOUS | Status: DC
  Administered 2024-02-14 – 2024-02-15 (×2): 1250 [IU]/h via INTRAVENOUS
  Filled 2024-02-14: qty 250

## 2024-02-14 MED ORDER — CARBAMAZEPINE 200 MG PO TABS
400.0000 mg | ORAL_TABLET | Freq: Two times a day (BID) | ORAL | Status: DC
  Administered 2024-02-14 – 2024-02-17 (×6): 400 mg via ORAL
  Filled 2024-02-14 (×6): qty 2

## 2024-02-14 MED ORDER — GABAPENTIN 300 MG PO CAPS
600.0000 mg | ORAL_CAPSULE | Freq: Two times a day (BID) | ORAL | Status: DC
  Administered 2024-02-14 – 2024-02-17 (×7): 600 mg via ORAL
  Filled 2024-02-14 (×7): qty 2

## 2024-02-14 MED ORDER — POLYETHYLENE GLYCOL 3350 17 G PO PACK
17.0000 g | PACK | Freq: Every day | ORAL | Status: DC | PRN
  Administered 2024-02-16: 17 g via ORAL
  Filled 2024-02-14: qty 1

## 2024-02-14 MED ORDER — INSULIN ASPART 100 UNIT/ML IJ SOLN: 0.0000 [IU] | Freq: Every day | INTRAMUSCULAR | Status: DC

## 2024-02-14 MED ORDER — ENOXAPARIN SODIUM 120 MG/0.8ML IJ SOSY: 120.0000 mg | PREFILLED_SYRINGE | Freq: Two times a day (BID) | INTRAMUSCULAR | Status: DC

## 2024-02-14 MED ORDER — LATANOPROST 0.005 % OP SOLN
1.0000 [drp] | Freq: Every day | OPHTHALMIC | Status: DC
  Administered 2024-02-15 – 2024-02-16 (×2): 1 [drp] via OPHTHALMIC
  Filled 2024-02-14: qty 2.5

## 2024-02-14 MED ORDER — BUDESON-GLYCOPYRROL-FORMOTEROL 160-9-4.8 MCG/ACT IN AERO
2.0000 | INHALATION_SPRAY | Freq: Two times a day (BID) | RESPIRATORY_TRACT | Status: DC
  Administered 2024-02-15 – 2024-02-17 (×5): 2 via RESPIRATORY_TRACT
  Filled 2024-02-14: qty 5.9

## 2024-02-14 MED ORDER — MORPHINE SULFATE (PF) 4 MG/ML IV SOLN
4.0000 mg | Freq: Once | INTRAVENOUS | Status: AC
  Administered 2024-02-14: 4 mg via INTRAVENOUS
  Filled 2024-02-14: qty 1

## 2024-02-14 MED ORDER — IOHEXOL 350 MG/ML SOLN
100.0000 mL | Freq: Once | INTRAVENOUS | Status: AC | PRN
  Administered 2024-02-14: 100 mL via INTRAVENOUS

## 2024-02-14 MED ORDER — ONDANSETRON HCL 4 MG/2ML IJ SOLN
4.0000 mg | Freq: Once | INTRAMUSCULAR | Status: AC
  Administered 2024-02-14: 4 mg via INTRAVENOUS
  Filled 2024-02-14: qty 2

## 2024-02-14 MED ORDER — HEPARIN BOLUS VIA INFUSION
4000.0000 [IU] | Freq: Once | INTRAVENOUS | Status: AC
  Administered 2024-02-14: 4000 [IU] via INTRAVENOUS
  Filled 2024-02-14: qty 4000

## 2024-02-14 MED ORDER — ENOXAPARIN SODIUM 120 MG/0.8ML IJ SOSY
120.0000 mg | PREFILLED_SYRINGE | INTRAMUSCULAR | Status: DC
  Filled 2024-02-14: qty 0.8

## 2024-02-14 NOTE — Progress Notes (Signed)
" °   02/14/24 2311  BiPAP/CPAP/SIPAP  $ Non-Invasive Home Ventilator  Initial  $ Face Mask Large  Yes  BiPAP/CPAP/SIPAP Pt Type Adult  BiPAP/CPAP/SIPAP Resmed  Mask Type Full face mask  Dentures removed? Not applicable  Mask Size Large  Respiratory Rate 16 breaths/min  Flow Rate 2 lpm (bled into circuit)  Patient Home Machine No  Patient Home Mask No  Patient Home Tubing No  Auto Titrate Yes  Minimum cmH2O 5 cmH2O  Maximum cmH2O 20 cmH2O  Device Plugged into RED Power Outlet Yes  BiPAP/CPAP /SiPAP Vitals  Resp 16  MEWS Score/Color  MEWS Score 0  MEWS Score Color Green    "

## 2024-02-14 NOTE — Progress Notes (Incomplete)
 Pharmacy Brief Note - Evening Anticoagulation Follow Up:  Pt is a 53 yoF on heparin  drip for PE. For full history, see note by Camelia Marina, PharmD from earlier today.   Assessment: Heparin  level = *** is *** on heparin  infusion of 1550 units/hr No bleeding or complications reported  Goal: Heparin  level 0.3 - 0.7  Plan: Heparin  Check heparin  level *** hours CBC, heparin  level daily Monitor for signs of bleeding  Ronal CHRISTELLA Rav, PharmD 02/14/24 12:54 PM

## 2024-02-14 NOTE — H&P (Signed)
 " History and Physical  Denise [name] DOB: 07-09-1970 DOA: 02/14/2024  PCP: Duwaine Annabella SAILOR, FNP   Chief Complaint: Chest pain and shortness of breath  HPI: Denise [name] is a 54 y.o. female with medical history significant for well-controlled type 2 diabetes, hypertension, chronic heart failure with preserved EF, COPD on chronic 2 L nasal cannula, bipolar disorder, cocaine abuse and recent diagnosis of lung adenocarcinoma not yet on treatment being admitted to the hospital with sudden onset of chest pain found to have acute PE.  She was recently admitted to [facility] until 1/9, during that hospitalization she had bronchoscopy with biopsy, and was seen by Dr. Gatha of oncology.  Path returned with primary lung adenocarcinoma, she has unfortunately continued to miss outpatient appointments.  She had follow-up scheduled with Dr. Sherrod on 1/19, states that she missed the appointment because transportation never arrived, she was waiting on her porch all morning for her ride.  In any case, early this morning, she was awakened from sleep with severe bilateral pleuritic chest pain, and sensation of increased shortness of breath.  She called EMS, was brought to the hospital for evaluation where workup as below shows evidence of acute PE.  Of note, during her last hospitalization she did have some rectal bleeding which resolved spontaneously, and her hemoglobin remained stable.  Patient states that she is too scared to look so does not know if her stool remains dark, or bloody.  However she notes that she continues to have some small amounts of bright red blood when she wipes after a bowel movement.  Review of Systems: Please see HPI for pertinent positives and negatives. A complete 10 system review of systems are otherwise negative.  Past Medical History:  Diagnosis Date   Abnormal EKG    Arthritis    Atopic dermatitis 05/28/2016   Back pain 07/29/2021   Blood transfusion  without reported diagnosis    Borderline glaucoma with ocular hypertension, 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   Chronic diastolic CHF (congestive heart failure) 07/29/2021   Class 3 obesity with alveolar hypoventilation, serious comorbidity, and body mass index (BMI) of 50.0 to 59.9 in adult 06/12/2020   CVA (cerebrovascular accident) 2017   2017 MRI - chronic left lenticulostriate territory hemorrhage or hemorrhagic infarct, with hemosiderin, and encephalomalacia affecting both the caudate and lentiform nucleus     Decreased mobility 06/15/2021   DOE (dyspnea on exertion) 06/12/2020   Essential hypertension 12/18/2014   Fibroid uterus 12/18/2014   Heart murmur, systolic 06/29/2021   History of alcohol abuse 02/16/2017   History of cocaine use 02/16/2017   History of latent syphilis 02/16/2017   Hyperlipidemia associated with type 2 diabetes mellitus 11/07/2020   Incontinence of urine    Lacunar infarction    MRI - chronic lacunar infarct within the posterior right lentiform nucleus.     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   Low back pain 11/21/2017   Lumbar degenerative disc disease    Major depressive disorder 02/16/2017   Myocardial infarction (HCC)    2015 in [facility]   Nuclear sclerosis of both eyes 10/20/2022   OSA on CPAP 06/12/2020   Overactive bladder    Pain due to onychomycosis of toenails of both feet 05/26/2020   Pelvic pain in female 12/18/2014   Psychogenic nonepileptic seizure 02/17/2017   PTSD (post-traumatic stress disorder)    Pulmonary emphysema 08/31/2021   QT prolongation 06/29/2021   Right sided weakness    Social anxiety disorder 11/03/2017   Syncope 02/07/2015   TIA (  transient ischemic attack)    Tobacco use 04/14/2015   Type 2 diabetes mellitus 10/21/2017   Venous stasis dermatitis of both lower extremities 06/15/2021   Past Surgical History:  Procedure Laterality Date   BRONCHIAL NEEDLE ASPIRATION BIOPSY  02/02/2024   Procedure: BRONCHOSCOPY, WITH NEEDLE ASPIRATION BIOPSY;   Surgeon: Zaida Zola SAILOR, MD;  Location: [facility];  Service: Pulmonary;;   BRONCHIAL WASHINGS  02/02/2024   Procedure: IRRIGATION, BRONCHUS;  Surgeon: Zaida Zola SAILOR, MD;  Location: [facility];  Service: Pulmonary;;   CESAREAN SECTION     x 2   VIDEO BRONCHOSCOPY WITH ENDOBRONCHIAL NAVIGATION Bilateral 02/02/2024   Procedure: VIDEO BRONCHOSCOPY WITH ENDOBRONCHIAL NAVIGATION;  Surgeon: Zaida Zola SAILOR, MD;  Location: [facility];  Service: Pulmonary;  Laterality: Bilateral;   VIDEO BRONCHOSCOPY WITH ENDOBRONCHIAL ULTRASOUND N/A 02/02/2024   Procedure: BRONCHOSCOPY, WITH EBUS;  Surgeon: Zaida Zola SAILOR, MD;  Location: [facility];  Service: Pulmonary;  Laterality: N/A;   VIDEO BRONCHOSCOPY WITH RADIAL ENDOBRONCHIAL ULTRASOUND  02/02/2024   Procedure: VIDEO BRONCHOSCOPY WITH RADIAL ENDOBRONCHIAL ULTRASOUND;  Surgeon: Zaida Zola SAILOR, MD;  Location: [facility];  Service: Pulmonary;;   Social History:  reports that she has been smoking cigarettes. She has never used smokeless tobacco. She reports current alcohol use of about 2.0 standard drinks of alcohol per week. She reports that she does not currently use drugs after having used the following drugs: Crack cocaine.  Allergies[1]  Family History  Problem Relation Age of Onset   Stroke Mother    Anxiety disorder Mother    Drug abuse Mother    Alcohol abuse Mother    Depression Mother    Drug abuse Father    Anxiety disorder Father    Heart disease Maternal Grandmother    Colon cancer Neg Hx    Esophageal cancer Neg Hx    Pancreatic cancer Neg Hx    Stomach cancer Neg Hx    Liver disease Neg Hx    Breast cancer Neg Hx      Prior to Admission medications  Medication Sig Start Date End Date Taking? Authorizing Provider  amLODipine  (NORVASC ) 10 MG tablet Take 10 mg by mouth daily. 05/25/23   [provider]  BAYER BACK & BODY PAIN EX ST 500-32.5 MG TABS Take 2 tablets by mouth in the morning and at bedtime.    [provider]  budesonide -glycopyrrolate -formoterol  (BREZTRI  AEROSPHERE) 160-9-4.8 MCG/ACT AERO inhaler Inhale 2 puffs into the lungs in the morning and at bedtime. 10/11/23   Cobb, Comer GAILS, NP  carbamazepine  (TEGRETOL ) 200 MG tablet Take 2 tablets (400 mg total) by mouth 2 (two) times daily. 12/15/23   Skeet Juliene SAUNDERS, DO  diphenhydrAMINE -PE-APAP (MUCINEX  FAST-MAX NGHT COLD/FLU) 12.5-5-325 MG TABS Take 2 tablets by mouth at bedtime.    [provider]  furosemide  (LASIX ) 80 MG tablet TAKE 1 TABLET BY MOUTH EVERY DAY 11/10/23   Chandrasekhar, Mahesh A, MD  gabapentin  (NEURONTIN ) 300 MG capsule Take 2 capsules (600 mg total) by mouth 2 (two) times daily. 02/03/24   Cheryle Page, MD  latanoprost  (XALATAN ) 0.005 % ophthalmic solution Place 1 drop into both eyes at bedtime. 09/19/23 09/18/24  [provider]  lisinopril  (ZESTRIL ) 30 MG tablet Take 30 mg by mouth daily. 03/29/23   [provider]  metFORMIN  (GLUCOPHAGE ) 500 MG tablet Take 500 mg by mouth in the morning and at bedtime. 05/25/17   [provider]  Olopatadine  HCl 0.2 % SOLN Place 1 drop into both  eyes daily. 09/19/23   [provider]  OXYGEN Inhale 2 L/min into the lungs continuous.    [provider]  polyethylene glycol powder (GLYCOLAX /MIRALAX ) 17 GM/SCOOP powder Take 17 g by mouth daily as needed. Dissolve 1 capful (17g) in 4-8 ounces of liquid and take by mouth daily. 02/03/24   Cheryle Page, MD  potassium chloride  SA (KLOR-CON  M) 20 MEQ tablet Take 1 tablet (20 mEq total) by mouth daily. 08/01/21   Jillian Buttery, MD  PRESCRIPTION MEDICATION CPAP - At bedtime and during any time of rest    [provider]  senna-docusate (SENOKOT-S) 8.6-50 MG tablet Take 1 tablet by mouth at bedtime as needed for mild constipation. 02/03/24   Cheryle Page, MD  VENTOLIN  HFA 108 (90 Base) MCG/ACT inhaler inhale 2 PUFFS into THE lungs EVERY 6 HOURS AS NEEDED FOR WHEEZING OR SHORTNESS OF BREATH  02/15/22   Shellia Oh, MD    Physical Exam: BP (!) 151/84 (BP Location: Right Arm)   Pulse 86   Temp 98.6 F (37 C) (Oral)   Resp 15   LMP 05/29/2017   SpO2 96%  General:  Alert, oriented, pleasant and cooperative but somewhat anxious, in no acute distress, resting comfortably on her baseline 2 L nasal cannula oxygen Cardiovascular: RRR, no murmurs or rubs, no peripheral edema  Respiratory: clear to auscultation bilaterally, no wheezes, no crackles  Abdomen: soft, nontender, nondistended, normal bowel tones heard  Skin: dry, no rashes  Musculoskeletal: no joint effusions, normal range of motion  Psychiatric: appropriate affect, normal speech  Neurologic: extraocular muscles intact, clear speech, moving all extremities with intact sensorium         Labs on Admission:  Basic Metabolic Panel: Recent Labs  Lab 02/14/24 0812 02/14/24 0913  NA 136 134*  K 4.1 5.8*  CL 98 98  CO2 25  --   GLUCOSE 112* 107*  BUN 5* 6  CREATININE 0.63 0.70  CALCIUM  9.7  --    Liver Function Tests: Recent Labs  Lab 02/14/24 0859  AST 23  ALT 14  ALKPHOS 165*  BILITOT 0.4  PROT 7.1  ALBUMIN 4.3   Recent Labs  Lab 02/14/24 0859  LIPASE 17   No results for input(s): AMMONIA in the last 168 hours. CBC: Recent Labs  Lab 02/14/24 0812 02/14/24 0913  WBC 11.9*  --   HGB 14.5 14.6  HCT 42.9 43.0  MCV 91.1  --   PLT 270  --    Cardiac Enzymes: No results for input(s): CKTOTAL, CKMB, CKMBINDEX, TROPONINI in the last 168 hours. BNP (last 3 results) Recent Labs    02/17/23 1649  BNP 37.3    ProBNP (last 3 results) Recent Labs    11/30/23 0908 01/30/24 2055  PROBNP <50.0 <50.0    CBG: No results for input(s): GLUCAP in the last 168 hours.  Radiological Exams on Admission: CT Angio Chest/Abd/Pel for Dissection W and/or Wo Contrast Result Date: 02/14/2024 CLINICAL DATA:  Acute aortic syndrome suspected. Sudden onset chest pain and flank pain bilaterally last  night and this morning. Recent diagnosis of lung cancer. EXAM: CT ANGIOGRAPHY CHEST, ABDOMEN AND PELVIS TECHNIQUE: Non-contrast CT of the chest was initially obtained. Multidetector CT imaging through the chest, abdomen and pelvis was performed using the standard protocol during bolus administration of intravenous contrast. Multiplanar reconstructed images and MIPs were obtained and reviewed to evaluate the vascular anatomy. RADIATION DOSE REDUCTION: This exam was performed according to the departmental dose-optimization program which includes  automated exposure control, adjustment of the mA and/or kV according to patient size and/or use of iterative reconstruction technique. CONTRAST:  OMNIPAQUE  IOHEXOL  350 MG/ML SOLN COMPARISON:  01/31/2024, 11/30/2023 and CT abdomen 06/23/2023 FINDINGS: CTA CHEST FINDINGS Cardiovascular: Borderline stable cardiomegaly. Thoracic aorta is normal in caliber. Mild calcified plaque over the descending thoracic aorta. No evidence of aortic aneurysm or dissection. Pulmonary arterial system is well opacified and demonstrates multiple segmental and subsegmental emboli predominately over the right lower lobe and minimally over the right middle and upper lobes. No significant left-sided pulmonary emboli visualized. Remaining vascular structures are unremarkable. Mediastinum/Nodes: Stable 1.3 cm prevascular lymph node. 1.1 cm right paratracheal lymph node without significant change. 1.4 cm precarinal lymph node unchanged. 1.7 cm AP window lymph node without significant change. 1.3 cm subcarinal lymph node. Evidence of bilateral hilar adenopathy. Remaining mediastinal structures are unremarkable. Lungs/Pleura: Lungs are adequately inflated. No significant change in a posteromedial left upper lobe lung mass measuring 2 x 3 cm concerning for primary bronchogenic neoplasm. Numerous bilateral smaller pulmonary nodules without significant change compatible with metastatic disease. No acute  airspace process. No significant effusion. Airways are unremarkable. Musculoskeletal: No focal abnormality. Review of the MIP images confirms the above findings. CTA ABDOMEN AND PELVIS FINDINGS VASCULAR Aorta: Mild calcified plaque over the abdominal aorta which is normal in caliber. Celiac: Moderate focal narrowing of the origin of the celiac axis with there is mild calcified plaque present. Celiac artery is otherwise patent. SMA: Patent without evidence of aneurysm, dissection, vasculitis or significant stenosis. Renals: Both renal arteries are patent without evidence of aneurysm, dissection, vasculitis, fibromuscular dysplasia or significant stenosis. IMA: Patent without evidence of aneurysm, dissection, vasculitis or significant stenosis. Inflow: Patent without evidence of aneurysm, dissection, vasculitis or significant stenosis. Veins: No obvious venous abnormality within the limitations of this arterial phase study. Review of the MIP images confirms the above findings. NON-VASCULAR Hepatobiliary: Liver, gallbladder and biliary tree are normal. Pancreas: Normal. Spleen: Normal. Adrenals/Urinary Tract: Adrenal glands are normal. Kidneys are normal in size without hydronephrosis or nephrolithiasis. 1.5 cm hypodensity over the lower pole cortex right kidney with Hounsfield unit measurements of 38 new since the prior exams. This is indeterminate. Ureters and bladder are normal. Stomach/Bowel: Stomach and small bowel are normal. Appendix is normal. There is mild diverticulosis of the colon which is otherwise unremarkable. Lymphatic: No significant adenopathy. Reproductive: Stable calcified uterine fibroid. No adnexal regions are unremarkable. Other: Continued evidence of extensive peritoneal spread of disease more prominent over the anterior mid to lower abdomen with largest focus being a conglomerate mass over the anterior left lower quadrant measuring approximately 5 x 6.6 cm. Slight interval progression of this  peritoneal disease. Small amount of free fluid in the pelvis. Musculoskeletal: No focal abnormality. Review of the MIP images confirms the above findings. IMPRESSION: 1. No evidence of aortic aneurysm or dissection. 2. Multiple segmental and subsegmental pulmonary emboli over the right lower lobe and minimally over the right middle and upper lobes. 3. Stable 3 cm posteromedial left upper lobe lung mass concerning for primary bronchogenic neoplasm. Numerous bilateral smaller pulmonary nodules without significant change compatible with metastatic disease. 4. Stable mediastinal and bilateral hilar adenopathy compatible with metastatic disease. 5. Continued evidence of extensive peritoneal spread of disease more prominent over the anterior mid to lower abdomen with slight interval progression of this peritoneal disease. Small amount of free fluid in the pelvis. 6. 1.5 cm hypodensity over the lower pole cortex right kidney new since the prior  exams. This is indeterminate. Recommend attention on follow-up. 7. Colonic diverticulosis. 8. Aortic atherosclerosis. Atherosclerotic coronary artery disease. Aortic Atherosclerosis (ICD10-I70.0). Critical Value/emergent results were called by telephone at the time of interpretation on 02/14/2024 at 10:05 am to provider JOSHUA LONG , who verbally acknowledged these results. Electronically Signed   By: Toribio Agreste M.D.   On: 02/14/2024 10:05   Assessment/Plan Denise [name] is a 54 y.o. female with medical history significant for well-controlled type 2 diabetes, hypertension, chronic heart failure with preserved EF, COPD on chronic 2 L nasal cannula, bipolar disorder, cocaine abuse and recent diagnosis of lung adenocarcinoma not yet on treatment being admitted to the hospital with sudden onset of chest pain found to have acute PE.  Acute PE-in the setting of newly recognized lung adenocarcinoma, patient is hemodynamically stable with no clinical evidence of heart  strain.  She has minimal troponin elevation. -Inpatient admission -Monitor closely on progressive unit -IV heparin  drip, due to recent GI bleed -Check 2D echo to rule out right heart strain, trend troponin -Check bilateral lower extremity Dopplers  Lower GI bleeding-noted during her last hospitalization in early January, it was noted to have spontaneously stopped.  Her hemoglobin had remained stable.  Currently, patient states that she has blood when she wipes, but has been afraid to look and so does not know if her stool remains melanotic or bloody. -Monitor clinically for GI bleed -Monitor CBC daily  Lung adenocarcinoma-new diagnosis, bronchoscopy was performed in early January during most recent hospitalization.  Patient has had ongoing issues with missing multiple clinic appointments.  She missed outpatient oncology follow-up 1/19. -ER provider discussed with oncology, who will consult  Type 2 diabetes-well-controlled -Carb modified diet -Hold home metformin , moderate dose sliding scale insulin   Chronic congestive heart failure with preserved EF-clinically no evidence of heart failure exacerbation -Continue home lisinopril  -Follow-up echo-she may benefit from being on goal-directed medical therapy  Hypertension-continue home lisinopril   Neuropathy-gabapentin  twice daily  COPD-with chronic hypoxic respiratory failure, no evidence of exacerbation -Continue supplemental oxygen -Breztri  twice daily    Code Status: Full Code  Consults called: Oncology Dr. Sherrod  Admission status: The appropriate patient status for this patient is INPATIENT. Inpatient status is judged to be reasonable and necessary in order to provide the required intensity of service to ensure the patient's safety. The patient's presenting symptoms, physical exam findings, and initial radiographic and laboratory data in the context of their chronic comorbidities is felt to place them at high risk for further  clinical deterioration. Furthermore, it is not anticipated that the patient will be medically stable for discharge from the hospital within 2 midnights of admission.    I certify that at the point of admission it is my clinical judgment that the patient will require inpatient hospital care spanning beyond 2 midnights from the point of admission due to high intensity of service, high risk for further deterioration and high frequency of surveillance required  Time spent: 59 minutes  Kylle Lall CHRISTELLA Gail MD [facility] Pager [phone]  If 7PM-7AM, please contact night-coverage [email or web address] Password [facility]  02/14/2024, 10:41 AM      [1]  Allergies Allergen Reactions   Gadolinium Derivatives Nausea And Vomiting and Other (See Comments)    Nausea and vomiting was followed by epileptic seizure episode that lasted approximately 5 minutes; pt was unable to verbally communicate during that time; she then came to and was able to speak; was evaluated by Rad and RN; kms    Grapefruit Flavor [employer]  Agent (Non-Screening)] Other (See Comments)    Drug interaction   "

## 2024-02-14 NOTE — ED Triage Notes (Signed)
 Pt BIB EMS from Home due to sudden onset of Chest pain and flank pain bilaterally since last night worsening this morning. Hx of COPD, Lung Cancer recently diagnosed, MI, Stroke. Pt endorsed taking cocaine for pain but did not work, pt reports never having a reaction to cocaine in the past., EKG unremarkable. 2L [place] at baseline.  BP 160/76

## 2024-02-14 NOTE — Progress Notes (Signed)
 PHARMACY - ANTICOAGULATION CONSULT NOTE  Pharmacy Consult for Heparin  Indication: pulmonary embolus  Allergies[1]  Patient Measurements: IBW: 57kg TWB 121 kg Heparin  DW: 86 kg  Vital Signs: Temp: 99 F (37.2 C) (01/20 1928) Temp Source: Oral (01/20 1928) BP: 115/55 (01/20 1928) Pulse Rate: 84 (01/20 1928)  Labs: Recent Labs    02/14/24 0812 02/14/24 0910 02/14/24 0913 02/14/24 1948  HGB 14.5  --  14.6  --   HCT 42.9  --  43.0  --   PLT 270  --   --   --   LABPROT  --  13.2  --   --   INR  --  0.9  --   --   HEPARINUNFRC  --   --   --  0.98*  CREATININE 0.63  --  0.70  --     Estimated Creatinine Clearance: 103.6 mL/min (by C-G formula based on SCr of 0.7 mg/dL).   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   Chronic diastolic CHF (congestive heart failure) 07/29/2021   Class 3 obesity with alveolar hypoventilation, serious comorbidity, and body mass index (BMI) of 50.0 to 59.9 in adult 06/12/2020   CVA (cerebrovascular accident) 2017   2017 MRI - chronic left lenticulostriate territory hemorrhage or hemorrhagic infarct, with hemosiderin, and encephalomalacia affecting both the caudate and lentiform nucleus     Decreased mobility 06/15/2021   DOE (dyspnea on exertion) 06/12/2020   Essential hypertension 12/18/2014   Fibroid uterus 12/18/2014   Heart murmur, systolic 06/29/2021   History of alcohol abuse 02/16/2017   History of cocaine use 02/16/2017   History of latent syphilis 02/16/2017   Hyperlipidemia associated with type 2 diabetes mellitus 11/07/2020   Incontinence of urine    Lacunar infarction    MRI - chronic lacunar infarct within the posterior right lentiform nucleus.     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   Low back pain 11/21/2017   Lumbar  degenerative disc disease    Major depressive disorder 02/16/2017   Myocardial infarction (HCC)    2015 in [facility]   Nuclear sclerosis of both eyes 10/20/2022   OSA on CPAP 06/12/2020   Overactive bladder    Pain due to onychomycosis of toenails of both feet 05/26/2020   Pelvic pain in female 12/18/2014   Psychogenic nonepileptic seizure 02/17/2017   PTSD (post-traumatic stress disorder)    Pulmonary emphysema 08/31/2021   QT prolongation 06/29/2021   Right sided weakness    Social anxiety disorder 11/03/2017   Syncope 02/07/2015   TIA (transient ischemic attack)    Tobacco use 04/14/2015   Type 2 diabetes mellitus 10/21/2017   Venous stasis dermatitis of both lower extremities 06/15/2021    Assessment:  54 yo F with new PE per chest CT.  Not on anticoagulation PTA.  Pharmacy consulted to dose IV heparin .  Baseline Hgb 14.6, Plts 270. Scr <1  02/14/2024: Initial heparin  level 0.98- elevated on IV heparin  1550 units/hr Verified lab drawn from opposite arm as heparin  infusing No bleeding or infusion related concerns reported by RN  Goal of Therapy:  AntiXa level 0.3-0.7 Monitor platelets by anticoagulation protocol: Yes   Plan:  Hold heparin  for 1h the resume heparin  infusion at reduced rate of 1250 units/hr Check heparin  level 8 hrs after heparin   resumeds Daily heparin  level and CBC while on heparin   Rosaline Millet PharmD 02/14/2024,8:29 PM       [1]  Allergies Allergen Reactions   Gadolinium Derivatives Nausea And Vomiting and Other (See Comments)    Nausea and vomiting was followed by epileptic seizure episode that lasted approximately 5 minutes; pt was unable to verbally communicate during that time; she then came to and was able to speak; was evaluated by Rad and RN; kms    Grapefruit Flavor [Flavoring Agent (Non-Screening)] Other (See Comments)    Drug interaction

## 2024-02-14 NOTE — Progress Notes (Addendum)
 PHARMACY - ANTICOAGULATION CONSULT NOTE  Pharmacy Consult for Heparin  Indication: pulmonary embolus  Allergies[1]  Patient Measurements: IBW: 57kg TWB 121 kg Heparin  DW: 86 kg  Vital Signs: Temp: 98.6 F (37 C) (01/20 0803) Temp Source: Oral (01/20 0803) BP: 151/84 (01/20 0803) Pulse Rate: 86 (01/20 0803)  Labs: Recent Labs    02/14/24 0812 02/14/24 0910 02/14/24 0913  HGB 14.5  --  14.6  HCT 42.9  --  43.0  PLT 270  --   --   LABPROT  --  13.2  --   INR  --  0.9  --   CREATININE 0.63  --  0.70    Estimated Creatinine Clearance: 103.6 mL/min (by C-G formula based on SCr of 0.7 mg/dL).   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   Chronic diastolic CHF (congestive heart failure) 07/29/2021   Class 3 obesity with alveolar hypoventilation, serious comorbidity, and body mass index (BMI) of 50.0 to 59.9 in adult 06/12/2020   CVA (cerebrovascular accident) 2017   2017 MRI - chronic left lenticulostriate territory hemorrhage or hemorrhagic infarct, with hemosiderin, and encephalomalacia affecting both the caudate and lentiform nucleus     Decreased mobility 06/15/2021   DOE (dyspnea on exertion) 06/12/2020   Essential hypertension 12/18/2014   Fibroid uterus 12/18/2014   Heart murmur, systolic 06/29/2021   History of alcohol abuse 02/16/2017   History of cocaine use 02/16/2017   History of latent syphilis 02/16/2017   Hyperlipidemia associated with type 2 diabetes mellitus 11/07/2020   Incontinence of urine    Lacunar infarction    MRI - chronic lacunar infarct within the posterior right lentiform nucleus.     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   Low back pain 11/21/2017   Lumbar degenerative disc disease    Major depressive disorder 02/16/2017   Myocardial  infarction (HCC)    2015 in [facility]   Nuclear sclerosis of both eyes 10/20/2022   OSA on CPAP 06/12/2020   Overactive bladder    Pain due to onychomycosis of toenails of both feet 05/26/2020   Pelvic pain in female 12/18/2014   Psychogenic nonepileptic seizure 02/17/2017   PTSD (post-traumatic stress disorder)    Pulmonary emphysema 08/31/2021   QT prolongation 06/29/2021   Right sided weakness    Social anxiety disorder 11/03/2017   Syncope 02/07/2015   TIA (transient ischemic attack)    Tobacco use 04/14/2015   Type 2 diabetes mellitus 10/21/2017   Venous stasis dermatitis of both lower extremities 06/15/2021    Assessment: Active Problem(s): sudden onset of Chest pain and flank pain bilaterally since last night worsening this morning   AC/Heme: Lovenox  for PE. Baseline Hgb 14.6, Plts 270. Scr <1  Goal of Therapy:  Anti-Xa level 0.6-1 units/ml 4hrs after LMWH dose given Monitor platelets by anticoagulation protocol: Yes   Plan:  Heparin  4000 unit IV bolus Heparin  in fusion 1500 units/hr Check heparin  level in 6-8 hrs Daily heparin  level and cBC   Youssouf Shipley Karoline Marina, PharmD, BCPS Clinical Staff Pharmacist Marina, Holdyn Poyser Stillinger 02/14/2024,10:15 AM      [1]  Allergies Allergen Reactions   Gadolinium Derivatives Nausea And Vomiting and Other (See Comments)    Nausea and vomiting was followed by epileptic seizure episode that lasted approximately 5 minutes; pt was unable  to verbally communicate during that time; she then came to and was able to speak; was evaluated by Rad and RN; kms    Grapefruit Flavor [Flavoring Agent (Non-Screening)] Other (See Comments)    Drug interaction

## 2024-02-14 NOTE — Progress Notes (Signed)
 [facility] Clinical Social Work  Initial Assessment   Velva Humphrey-Headen is a 54 y.o. year old female contacted by phone. Clinical Social Work was referred by medical provider for assessment of psychosocial needs.   SDOH (Social Determinants of Health) assessments performed: No SDOH Interventions    Flowsheet Row ED to Hosp-Admission (Discharged) from 01/30/2024 in [facility] from 01/10/2024 in [facility] Emergency Department at [facility] ED to Hosp-Admission (Discharged) from 06/24/2023 in [facility] to Hosp-Admission (Discharged) from 04/08/2023 in [place] from 08/17/2021 in [facility] Department at [facility]  SDOH Interventions       Food Insecurity Interventions Intervention Not Indicated, Inpatient TOC -- Intervention Not Indicated, Inpatient TOC Intervention Not Indicated, Inpatient TOC --  Housing Interventions Intervention Not Indicated, Inpatient TOC -- Intervention Not Indicated, Inpatient TOC Intervention Not Indicated, Inpatient TOC Other (Comment)  Transportation Interventions Intervention Not Indicated, Inpatient TOC -- Intervention Not Indicated, Inpatient TOC Taxi Voucher Given --  Utilities Interventions Intervention Not Indicated, Inpatient TOC -- Intervention Not Indicated, Inpatient TOC Intervention Not Indicated, Inpatient TOC --  Depression Interventions/Treatment  -- Medication -- -- --  Social Connections Interventions Intervention Not Indicated, Inpatient TOC -- -- -- --    SDOH Screenings   Food Insecurity: No Food Insecurity (01/31/2024)  Housing: Low Risk (01/31/2024)  Transportation Needs: No Transportation Needs (01/31/2024)  Utilities: Not At Risk (01/31/2024)  Depression (PHQ2-9): High Risk (01/10/2024)  Financial Resource Strain: Not on File (05/14/2021)   [facility]  Physical Activity: Not on File  (05/14/2021)   Received from [facility]  Social Connections: Unknown (02/17/2023)  Stress: Not on File (05/14/2021)   Received from [facility]  Tobacco Use: High Risk (02/14/2024)    PHQ 2/9:    01/10/2024    3:00 PM 07/15/2022    9:39 AM 04/29/2022    9:02 AM  Depression screen PHQ 2/9  Decreased Interest 2    Down, Depressed, Hopeless 2    PHQ - 2 Score 4    Altered sleeping 2    Tired, decreased energy 1    Change in appetite 1    Feeling bad or failure about yourself  2    Trouble concentrating 2    Moving slowly or fidgety/restless 1    Suicidal thoughts 2    PHQ-9 Score 15    Difficult doing work/chores        Information is confidential and restricted. Go to Review Flowsheets to unlock data.     Distress Screen completed: No     No data to display            Family/Social Information:  Housing Arrangement: patient lives with her husband who has multiple medical issues and their 27 y/o son who reportedly works.  Pt reports she ambulates w/ a rollator but her apartment is small which makes it difficult to ambulate w/ the rollator, as a result pt is primarily sedentary.  Family members/support persons in your life? Pt states she does not have local family who are able to offer support. Transportation concerns: yes, pt utilizes Medicaid transport but frequently misses appts if transportation does not get booked in a timely fashion or does not show up.   Employment: Unemployed .  Income source: Special Educational Needs Teacher Income Financial concerns: Yes, current concerns Type of concern: Utilities and Rent/ mortgage Food access concerns: yes Religious or spiritual practice: Not known  Advanced directives: Not known Services Currently in place:  pt has an aid who assists w/ ADLs, some days for 2 hrs and other days for 3 1/2 hrs.  Coping/ Adjustment to diagnosis: Patient understands treatment plan and what happens next? no, pt has not come in for her consult and is still undergoing  diagnostics.  Pt verbalized she would be in for appt on 1/19 over the phone but did not come in at the scheduled time.  Pt brought to the ED 1/20. Concerns about diagnosis and/or treatment: Overwhelmed by information, Afraid of cancer, How will I care for myself, and Quality of life Patient reported stressors: Finances, Transportation, Anxiety/ nervousness, and Adjusting to my illness Hopes and/or priorities: Pt w/ multiple medical co-morbidities and is having difficulty navigating appts and prioritizing her health. Patient enjoys not discussed Current coping skills/ strengths: Pt is presently overwhelmed w/ little to no support.  Pt w/ substance abuse issues.  Pt also diagnosed w/ bi-polar disorder for which she has not had supportive treatment for a number of years.  Pt reports she had an appointment recently to establish w/ a psychiatrist through her PCP, but missed the appt due to hospitalization.      SUMMARY: Current SDOH Barriers:  Financial constraints related to limited income, Limited social support, Transportation, and Substance abuse issues -  cocaine/ alcohol  Clinical Social Work Clinical Goal(s):  Explore community resource options for unmet needs related to:  Financial Strain   Interventions: Discussed common feeling and emotions when being diagnosed with cancer, and the importance of support during treatment Informed patient of the support team roles and support services at [facility] Provided CSW contact information and encouraged patient to call with any questions or concerns CSW discussed financial resources available if pt engages in treatment.  Pt strongly encouraged to attend her oncology appts.  Pt also strongly encouraged to re-book psychiatry appt.   Follow Up Plan: CSW will continue to encourage pt to engage in treatment and will meet w/ pt in person if she comes in for consultation.  Patient verbalizes understanding of plan: Yes    Devere JONELLE [name], LCSW Clinical Social  Worker [facility]  Patient is participating in a Managed Medicaid Plan:  Yes

## 2024-02-14 NOTE — ED Provider Notes (Signed)
 "  Emergency Department Provider Note   I have reviewed the triage vital signs and the nursing notes.   HISTORY  Chief Complaint Chest Pain and Flank Pain   HPI Denise [name] is a 54 y.o. female with past history reviewed below including recent diagnosis of adenocarcinoma of the lung after macroscopy and FNA presents to the emergency department with severe pain into the bilateral chest and bilateral flanks.  Symptoms woke her from sleep this morning.  She states she was having some difficulty last night and did use cocaine.  She went to sleep and woke up with the severe pain described above.  No vomiting or diarrhea.  No fevers.  No falls or injury.  No reaction like this to cocaine in the past.  She reports drinking 2 beers yesterday in addition to the cocaine but no other drugs.  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   Chronic diastolic CHF (congestive heart failure) 07/29/2021   Class 3 obesity with alveolar hypoventilation, serious comorbidity, and body mass index (BMI) of 50.0 to 59.9 in adult 06/12/2020   CVA (cerebrovascular accident) 2017   2017 MRI - chronic left lenticulostriate territory hemorrhage or hemorrhagic infarct, with hemosiderin, and encephalomalacia affecting both the caudate and lentiform nucleus     Decreased mobility 06/15/2021   DOE (dyspnea on exertion) 06/12/2020   Essential hypertension 12/18/2014   Fibroid uterus 12/18/2014   Heart murmur, systolic 06/29/2021   History of alcohol abuse 02/16/2017   History of cocaine use 02/16/2017   History of latent syphilis 02/16/2017   Hyperlipidemia associated with type 2 diabetes mellitus 11/07/2020   Incontinence of urine    Lacunar infarction    MRI - chronic lacunar infarct within the posterior right  lentiform nucleus.     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   Low back pain 11/21/2017   Lumbar degenerative disc disease    Major depressive disorder 02/16/2017   Myocardial infarction (HCC)    2015 in [facility]   Nuclear sclerosis of both eyes 10/20/2022   OSA on CPAP 06/12/2020   Overactive bladder    Pain due to onychomycosis of toenails of both feet 05/26/2020   Pelvic pain in female 12/18/2014   Psychogenic nonepileptic seizure 02/17/2017   PTSD (post-traumatic stress disorder)    Pulmonary emphysema 08/31/2021   QT prolongation 06/29/2021   Right sided weakness    Social anxiety disorder 11/03/2017   Syncope 02/07/2015   TIA (transient ischemic attack)    Tobacco use 04/14/2015   Type 2 diabetes mellitus 10/21/2017   Venous stasis dermatitis of both lower extremities 06/15/2021    Review of Systems  Constitutional: No fever/chills Cardiovascular: Positive chest pain. Respiratory: Denies shortness of breath. Gastrointestinal: Positive flank/abdominal pain.  No nausea, no vomiting.  No diarrhea.  No constipation. Neurological: Negative for headaches.  ____________________________________________   PHYSICAL EXAM:  VITAL SIGNS: ED Triage Vitals  Encounter Vitals Group     BP 02/14/24 0803 (!) 151/84     Pulse Rate 02/14/24 0803 86     Resp 02/14/24 0803 15     Temp 02/14/24 0803 98.6 F (37 C)     Temp Source 02/14/24 0803 Oral     SpO2 02/14/24 0803 96 %   Constitutional: Alert and oriented. Patient is tearful but able  to provide a full history.  Eyes: Conjunctivae are normal. Head: Atraumatic. Nose: No congestion/rhinnorhea. Mouth/Throat: Mucous membranes are moist.   Neck: No stridor.   Cardiovascular: Normal rate, regular rhythm. Good peripheral circulation. Grossly normal heart sounds.   Respiratory: Normal respiratory effort.  No retractions. Lungs CTAB. Gastrointestinal: Soft with mild diffuse tenderness. No distention.  Musculoskeletal: No gross deformities of  extremities. Neurologic:  Normal speech and language.    ____________________________________________   LABS (all labs ordered are listed, but only abnormal results are displayed)  Labs Reviewed  BASIC METABOLIC PANEL WITH GFR - Abnormal; Notable for the following components:      Result Value   Glucose, Bld 112 (*)    BUN 5 (*)    All other components within normal limits  CBC - Abnormal; Notable for the following components:   WBC 11.9 (*)    All other components within normal limits  HEPATIC FUNCTION PANEL - Abnormal; Notable for the following components:   Alkaline Phosphatase 165 (*)    All other components within normal limits  I-STAT CHEM 8, ED - Abnormal; Notable for the following components:   Sodium 134 (*)    Potassium 5.8 (*)    Glucose, Bld 107 (*)    Calcium , Ion 1.10 (*)    All other components within normal limits  TROPONIN T, HIGH SENSITIVITY - Abnormal; Notable for the following components:   Troponin T High Sensitivity 34 (*)    All other components within normal limits  LIPASE, BLOOD  PROTIME-INR  URINALYSIS, ROUTINE W REFLEX MICROSCOPIC  TROPONIN T, HIGH SENSITIVITY   ____________________________________________  EKG   EKG Interpretation Date/Time:  Tuesday February 14 2024 08:22:07 EST Ventricular Rate:  85 PR Interval:  148 QRS Duration:  79 QT Interval:  401 QTC Calculation: 477 R Axis:   44  Text Interpretation: Sinus rhythm Low voltage, precordial leads Nonspecific T abnormalities, anterior leads Confirmed by Darra Chew [phone]) on 02/14/2024 8:26:22 AM        ____________________________________________  RADIOLOGY  CT Angio Chest/Abd/Pel for Dissection W and/or Wo Contrast Result Date: 02/14/2024 CLINICAL DATA:  Acute aortic syndrome suspected. Sudden onset chest pain and flank pain bilaterally last night and this morning. Recent diagnosis of lung cancer. EXAM: CT ANGIOGRAPHY CHEST, ABDOMEN AND PELVIS TECHNIQUE: Non-contrast CT of  the chest was initially obtained. Multidetector CT imaging through the chest, abdomen and pelvis was performed using the standard protocol during bolus administration of intravenous contrast. Multiplanar reconstructed images and MIPs were obtained and reviewed to evaluate the vascular anatomy. RADIATION DOSE REDUCTION: This exam was performed according to the departmental dose-optimization program which includes automated exposure control, adjustment of the mA and/or kV according to patient size and/or use of iterative reconstruction technique. CONTRAST:  OMNIPAQUE  IOHEXOL  350 MG/ML SOLN COMPARISON:  01/31/2024, 11/30/2023 and CT abdomen 06/23/2023 FINDINGS: CTA CHEST FINDINGS Cardiovascular: Borderline stable cardiomegaly. Thoracic aorta is normal in caliber. Mild calcified plaque over the descending thoracic aorta. No evidence of aortic aneurysm or dissection. Pulmonary arterial system is well opacified and demonstrates multiple segmental and subsegmental emboli predominately over the right lower lobe and minimally over the right middle and upper lobes. No significant left-sided pulmonary emboli visualized. Remaining vascular structures are unremarkable. Mediastinum/Nodes: Stable 1.3 cm prevascular lymph node. 1.1 cm right paratracheal lymph node without significant change. 1.4 cm precarinal lymph node unchanged. 1.7 cm AP window lymph node without significant change. 1.3 cm subcarinal lymph node. Evidence of bilateral hilar adenopathy. Remaining mediastinal structures  are unremarkable. Lungs/Pleura: Lungs are adequately inflated. No significant change in a posteromedial left upper lobe lung mass measuring 2 x 3 cm concerning for primary bronchogenic neoplasm. Numerous bilateral smaller pulmonary nodules without significant change compatible with metastatic disease. No acute airspace process. No significant effusion. Airways are unremarkable. Musculoskeletal: No focal abnormality. Review of the MIP images  confirms the above findings. CTA ABDOMEN AND PELVIS FINDINGS VASCULAR Aorta: Mild calcified plaque over the abdominal aorta which is normal in caliber. Celiac: Moderate focal narrowing of the origin of the celiac axis with there is mild calcified plaque present. Celiac artery is otherwise patent. SMA: Patent without evidence of aneurysm, dissection, vasculitis or significant stenosis. Renals: Both renal arteries are patent without evidence of aneurysm, dissection, vasculitis, fibromuscular dysplasia or significant stenosis. IMA: Patent without evidence of aneurysm, dissection, vasculitis or significant stenosis. Inflow: Patent without evidence of aneurysm, dissection, vasculitis or significant stenosis. Veins: No obvious venous abnormality within the limitations of this arterial phase study. Review of the MIP images confirms the above findings. NON-VASCULAR Hepatobiliary: Liver, gallbladder and biliary tree are normal. Pancreas: Normal. Spleen: Normal. Adrenals/Urinary Tract: Adrenal glands are normal. Kidneys are normal in size without hydronephrosis or nephrolithiasis. 1.5 cm hypodensity over the lower pole cortex right kidney with Hounsfield unit measurements of 38 new since the prior exams. This is indeterminate. Ureters and bladder are normal. Stomach/Bowel: Stomach and small bowel are normal. Appendix is normal. There is mild diverticulosis of the colon which is otherwise unremarkable. Lymphatic: No significant adenopathy. Reproductive: Stable calcified uterine fibroid. No adnexal regions are unremarkable. Other: Continued evidence of extensive peritoneal spread of disease more prominent over the anterior mid to lower abdomen with largest focus being a conglomerate mass over the anterior left lower quadrant measuring approximately 5 x 6.6 cm. Slight interval progression of this peritoneal disease. Small amount of free fluid in the pelvis. Musculoskeletal: No focal abnormality. Review of the MIP images confirms  the above findings. IMPRESSION: 1. No evidence of aortic aneurysm or dissection. 2. Multiple segmental and subsegmental pulmonary emboli over the right lower lobe and minimally over the right middle and upper lobes. 3. Stable 3 cm posteromedial left upper lobe lung mass concerning for primary bronchogenic neoplasm. Numerous bilateral smaller pulmonary nodules without significant change compatible with metastatic disease. 4. Stable mediastinal and bilateral hilar adenopathy compatible with metastatic disease. 5. Continued evidence of extensive peritoneal spread of disease more prominent over the anterior mid to lower abdomen with slight interval progression of this peritoneal disease. Small amount of free fluid in the pelvis. 6. 1.5 cm hypodensity over the lower pole cortex right kidney new since the prior exams. This is indeterminate. Recommend attention on follow-up. 7. Colonic diverticulosis. 8. Aortic atherosclerosis. Atherosclerotic coronary artery disease. Aortic Atherosclerosis (ICD10-I70.0). Critical Value/emergent results were called by telephone at the time of interpretation on 02/14/2024 at 10:05 am to provider Ilyana Manuele , who verbally acknowledged these results. Electronically Signed   By: Toribio Agreste M.D.   On: 02/14/2024 10:05    ____________________________________________   PROCEDURES  Procedure(s) performed:   Procedures  CRITICAL CARE Performed by: Fonda KANDICE Law Total critical care time: 35 minutes Critical care time was exclusive of separately billable procedures and treating other patients. Critical care was necessary to treat or prevent imminent or life-threatening deterioration. Critical care was time spent personally by me on the following activities: development of treatment plan with patient and/or surrogate as well as nursing, discussions with consultants, evaluation of patient's response to treatment,  examination of patient, obtaining history from patient or surrogate,  ordering and performing treatments and interventions, ordering and review of laboratory studies, ordering and review of radiographic studies, pulse oximetry and re-evaluation of patient's condition.  Fonda Law, MD Emergency Medicine  ____________________________________________   INITIAL IMPRESSION / ASSESSMENT AND PLAN / ED COURSE  Pertinent labs & imaging results that were available during my care of the patient were reviewed by me and considered in my medical decision making (see chart for details).   This patient is Presenting for Evaluation of CP, which does require a range of treatment options, and is a complaint that involves a high risk of morbidity and mortality.  The Differential Diagnoses includes but is not exclusive to acute coronary syndrome, aortic dissection, pulmonary embolism, cardiac tamponade, community-acquired pneumonia, pericarditis, musculoskeletal chest wall pain, etc.   Critical Interventions-    Medications  enoxaparin  (LOVENOX ) injection 120 mg (has no administration in time range)  enoxaparin  (LOVENOX ) injection 120 mg (has no administration in time range)  sodium chloride  0.9 % bolus 500 mL (500 mLs Intravenous New Bag/Given 02/14/24 0905)  morphine  (PF) 4 MG/ML injection 4 mg (4 mg Intravenous Given 02/14/24 0855)  ondansetron  (ZOFRAN ) injection 4 mg (4 mg Intravenous Given 02/14/24 0856)  iohexol  (OMNIPAQUE ) 350 MG/ML injection 100 mL (100 mLs Intravenous Contrast Given 02/14/24 0907)    Reassessment after intervention: pain improved.   I decided to review pertinent External Data, and in summary patient discharged on 1/9 with acute on chronic hypoxic respiratory failure.  Bronchoscopy and lung mass biopsy performed at that time.   Clinical Laboratory Tests Ordered, included CBC with mild leukocytosis to 11.9.  No anemia.  No acute kidney injury.  LFTs, bilirubin, lipase normal.  Mildly elevated troponin at 34. Will continue to trend.   Radiologic Tests  Ordered, included CXR. I independently interpreted the images and agree with radiology interpretation.   Cardiac Monitor Tracing which shows NSR   Social Determinants of Health Risk patient is a smoker and uses cocaine.   Consult complete with Oncology, Dr. Sherrod. They have had the initial consultation. No further impatient workup required. Will need to continue with outpatient mgmt.   TRH, Dr. Roxane. Plan for admit.   Medical Decision Making: Summary:  Patient presents to the emergency department for evaluation of chest pain and flank discomfort.  Cocaine use last night.  No new hypoxemia today.  She remains on her 2 L nasal cannula and comfortable.  Plan for troponins, CMP, dissection protocols.  Reevaluation with update and discussion with patient.  She has multiple subsegmental pulmonary emboli on dissection scan.  Plan to start heparin  and admit for observation and troponin trending.  Suspect minimally elevated troponin is related to demand ischemia either in the setting of cocaine use versus pulmonary emboli.  She had some rectal bleeding during her last hospitalization which will need to be monitored now without we are starting anticoagulation.  This was thought to be due to hemorrhoids per chart review and discussion with the patient.  She has not had any recent or recurrent bleeding since.   Patient's presentation is most consistent with acute presentation with potential threat to life or bodily function.   Disposition: admit  ____________________________________________  FINAL CLINICAL IMPRESSION(S) / ED DIAGNOSES  Final diagnoses:  Multiple subsegmental pulmonary emboli without acute cor pulmonale (HCC)    Note:  This document was prepared using Dragon voice recognition software and may include unintentional dictation errors.  Fonda Law, MD, [facility] Emergency  Medicine    Johnpaul Gillentine, Fonda MATSU, MD 02/14/24 1021  "

## 2024-02-15 ENCOUNTER — Inpatient Hospital Stay (HOSPITAL_COMMUNITY)

## 2024-02-15 ENCOUNTER — Encounter (HOSPITAL_COMMUNITY): Payer: Self-pay | Admitting: Internal Medicine

## 2024-02-15 ENCOUNTER — Other Ambulatory Visit (HOSPITAL_COMMUNITY): Payer: Self-pay

## 2024-02-15 ENCOUNTER — Telehealth (HOSPITAL_COMMUNITY): Payer: Self-pay | Admitting: Pharmacy Technician

## 2024-02-15 DIAGNOSIS — Z86711 Personal history of pulmonary embolism: Secondary | ICD-10-CM | POA: Diagnosis not present

## 2024-02-15 DIAGNOSIS — I5031 Acute diastolic (congestive) heart failure: Secondary | ICD-10-CM

## 2024-02-15 DIAGNOSIS — I2699 Other pulmonary embolism without acute cor pulmonale: Secondary | ICD-10-CM | POA: Diagnosis not present

## 2024-02-15 LAB — HEPARIN LEVEL (UNFRACTIONATED)
Heparin Unfractionated: 0.27 [IU]/mL — ABNORMAL LOW (ref 0.30–0.70)
Heparin Unfractionated: 0.59 [IU]/mL (ref 0.30–0.70)

## 2024-02-15 LAB — CBC
HCT: 39.1 % (ref 36.0–46.0)
Hemoglobin: 13.2 g/dL (ref 12.0–15.0)
MCH: 31.2 pg (ref 26.0–34.0)
MCHC: 33.8 g/dL (ref 30.0–36.0)
MCV: 92.4 fL (ref 80.0–100.0)
Platelets: 278 K/uL (ref 150–400)
RBC: 4.23 MIL/uL (ref 3.87–5.11)
RDW: 14.3 % (ref 11.5–15.5)
WBC: 10.6 K/uL — ABNORMAL HIGH (ref 4.0–10.5)
nRBC: 0 % (ref 0.0–0.2)

## 2024-02-15 LAB — BASIC METABOLIC PANEL WITH GFR
Anion gap: 12 (ref 5–15)
BUN: 6 mg/dL (ref 6–20)
CO2: 24 mmol/L (ref 22–32)
Calcium: 9.8 mg/dL (ref 8.9–10.3)
Chloride: 101 mmol/L (ref 98–111)
Creatinine, Ser: 0.61 mg/dL (ref 0.44–1.00)
GFR, Estimated: >60 mL/min
Glucose, Bld: 122 mg/dL — ABNORMAL HIGH (ref 70–99)
Potassium: 4.3 mmol/L (ref 3.5–5.1)
Sodium: 138 mmol/L (ref 135–145)

## 2024-02-15 LAB — GLUCOSE, CAPILLARY
Glucose-Capillary: 143 mg/dL — ABNORMAL HIGH (ref 70–99)
Glucose-Capillary: 115 mg/dL — ABNORMAL HIGH (ref 70–99)

## 2024-02-15 LAB — ECHOCARDIOGRAM COMPLETE
AR max vel: 2.43 cm2
AV Area VTI: 2.67 cm2
AV Area mean vel: 2.56 cm2
AV Mean grad: 5 mmHg
AV Peak grad: 9.6 mmHg
Ao pk vel: 1.55 m/s
Area-P 1/2: 2.87 cm2
Calc EF: 61.8 %
MV VTI: 2.69 cm2
P 1/2 time: 661 ms
S' Lateral: 3 cm
Single Plane A2C EF: 60.9 %
Single Plane A4C EF: 62.3 %

## 2024-02-15 LAB — CBG MONITORING, ED
Glucose-Capillary: 143 mg/dL — ABNORMAL HIGH (ref 70–99)
Glucose-Capillary: 138 mg/dL — ABNORMAL HIGH (ref 70–99)

## 2024-02-15 MED ORDER — PERFLUTREN LIPID MICROSPHERE
1.0000 mL | INTRAVENOUS | Status: AC | PRN
  Administered 2024-02-15: 2 mL via INTRAVENOUS

## 2024-02-15 MED ORDER — ENOXAPARIN SODIUM 120 MG/0.8ML IJ SOSY
1.0000 mg/kg | PREFILLED_SYRINGE | Freq: Two times a day (BID) | INTRAMUSCULAR | Status: DC
  Filled 2024-02-15: qty 0.82

## 2024-02-15 MED ORDER — ENOXAPARIN SODIUM 120 MG/0.8ML IJ SOSY
120.0000 mg | PREFILLED_SYRINGE | Freq: Two times a day (BID) | INTRAMUSCULAR | Status: DC
  Administered 2024-02-15 – 2024-02-17 (×4): 120 mg via SUBCUTANEOUS
  Filled 2024-02-15 (×5): qty 0.8

## 2024-02-15 MED ORDER — INFLUENZA VIRUS VACC SPLIT PF (FLUZONE) 0.5 ML IM SUSY
0.5000 mL | PREFILLED_SYRINGE | INTRAMUSCULAR | Status: AC
  Administered 2024-02-16: 0.5 mL via INTRAMUSCULAR
  Filled 2024-02-15: qty 0.5

## 2024-02-15 MED ORDER — ALBUTEROL SULFATE (2.5 MG/3ML) 0.083% IN NEBU: 2.5000 mg | INHALATION_SOLUTION | RESPIRATORY_TRACT | Status: DC | PRN

## 2024-02-15 MED ORDER — ENOXAPARIN SODIUM 80 MG/0.8ML IJ SOSY
80.0000 mg | PREFILLED_SYRINGE | Freq: Once | INTRAMUSCULAR | Status: DC
  Filled 2024-02-15: qty 0.8

## 2024-02-15 NOTE — Progress Notes (Signed)
 PHARMACY - ANTICOAGULATION CONSULT NOTE  Pharmacy Consult for Heparin  Indication: pulmonary embolus  Allergies[1]  Patient Measurements: IBW: 57kg TWB 121 kg Heparin  DW: 86 kg  Vital Signs: Temp: 97.5 F (36.4 C) (01/21 0621) Temp Source: Oral (01/21 0621) BP: 127/88 (01/21 0621) Pulse Rate: 82 (01/21 0621)  Labs: Recent Labs    02/14/24 0812 02/14/24 0910 02/14/24 0913 02/14/24 1948 02/15/24 0630  HGB 14.5  --  14.6  --   --   HCT 42.9  --  43.0  --   --   PLT 270  --   --   --   --   LABPROT  --  13.2  --   --   --   INR  --  0.9  --   --   --   HEPARINUNFRC  --   --   --  0.98* 0.27*  CREATININE 0.63  --  0.70  --   --     Estimated Creatinine Clearance: 103.6 mL/min (by C-G formula based on SCr of 0.7 mg/dL).   Assessment: 62 yoF with recent diagnosis of lung cancer presents 1/20 with acute chest pain, and found to have new PE. During recent admission earlier this month, patient was noted to have rectal bleeding which was attributed to hemorrhoids, and had resolved prior to discharge. Pharmacy consulted to dose IV heparin .   Baseline INR WNL; aPTT not done Prior anticoagulation: none  Significant events:  Today, 02/15/2024: CBC: WNL on admission yesterday evening Most recent heparin  level now SUBtherapeutic after reducing rate to 1250 units/hr SCr stable & at baseline (<1.0) No bleeding or infusion issues per nursing  Goal of Therapy: Heparin  level 0.3-0.7 units/ml Monitor platelets by anticoagulation protocol: Yes  Plan: Increase heparin  IV infusion back to 1300 units/hr Check heparin  level 6-8 hrs after rate change Daily CBC, daily heparin  level once stable Monitor for signs of bleeding or thrombosis Of note, PTA Tegretol  precludes use of DOAC for maintenance therapy  Bard Jeans, PharmD, BCPS [phone] 02/15/2024, 7:36 AM    [1]  Allergies Allergen Reactions   Gadolinium Derivatives Nausea And Vomiting and Other (See Comments)     Nausea and vomiting was followed by epileptic seizure episode that lasted approximately 5 minutes; pt was unable to verbally communicate during that time; she then came to and was able to speak; was evaluated by Rad and RN; kms    Grapefruit Flavor [Flavoring Agent (Non-Screening)] Other (See Comments)    Drug interaction

## 2024-02-15 NOTE — Progress Notes (Signed)
 Bilateral lower extremity venous duplex has been completed.  Results can be found in chart review under CV Proc.  02/15/2024 12:42 PM  Cylee Dattilo Elden Appl, RVT.

## 2024-02-15 NOTE — Plan of Care (Signed)

## 2024-02-15 NOTE — Progress Notes (Addendum)
 " PROGRESS NOTE    Denise [name]  FMW:[id number] DOB: 06-04-1970 DOA: 02/14/2024 PCP: Duwaine Annabella SAILOR, FNP    Brief Narrative:   Denise [name] is a 54 y.o. 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  Patient has been self-medicating with cocaine for pain.  Reports no previous reaction to cocaine use like this in the past.  She has had several missed follow-up appointments outpatient for further evaluation and management of her new lung adenocarcinoma.  In the ED, temperature 98.6 F, HR 15, RR 151/84, SpO2 96% on 2 L nasal cannula.  WBC 11.9, hemoglobin 14.5, platelet count 270.  Sodium 136, potassium 4.1, chloride 98, CO2 25, glucose 112, BUN 5, creatinine 0.63.  Lipase 17.  AST 23, ALT 14, total bilirubin 0.3.  INR 0.9.  EKG with normal sinus rhythm, rate 85, QTc 477 without any concerning dynamic changes to include ST segment elevation/depression or T wave inversion.  CT angiogram chest/abdomen/pelvis with contrast with no evidence of aortic aneurysm/dissection, multiple segmental and subsegmental pulmonary emboli over the right lower lobe and minimally over the right middle lobe and upper lobes; stable 3 cm posterior medial left upper lobe lung mass concerning for primary bronchogenic neoplasm, multiple bilateral smaller pulm nodules without significant change compatible with metastatic disease, stable mediastinal and bilateral hilar adenopathy consistent with metastatic disease, extensive peritoneal spread of disease more prominent over the anterior mid to lower abdomen with slight interval progression of the peritoneal disease, small amount of free fluid,  1.5 cm hypodensity lower pole cortex right kidney new since prior exams, aortic atherosclerosis, colonic diverticulosis.  Patient was initially started on Lovenox  but changed to heparin  drip given history of rectal bleeding.  TRH consulted for admission for further evaluation and management of acute pulmonary embolism.  Assessment & Plan:   Acute pulmonary embolism Patient presenting with pleuritic chest pain, shortness of breath.  Patient is afebrile with slightly elevated WBC count of 11.9.  CT angiogram chest/abdomen/pelvis with findings of multiple segmental and subsegmental pulmonary emboli over the right lower lobe and minimally over the right middle lobe and upper lobes.  TTE with LVEF 60 to 65%, mild concentric LVH, normal diastolic parameters, no aortic stenosis, borderline dilation ascending aorta measuring 36 mm, IVC normal in size.  Vascular duplex was found bilateral lower extremities negative for DVT. -- Heparin  drip, pharmacy consulted for dosing/monitoring -- Not a candidate for DOAC given Tegretol  use for bipolar disorder -- Hgb 14.2>13.2, stable; will transition to Lovenox  today -- CBC in the am -- Strict I's and O's, monitor for blood in bowel movements  Hyperkalemia Potassium 5.8 -- Repeat BMP in a.m.  Hypomagnesemia Magnesium  1.6. -- Repeat magnesium  level in a.m.  Lung adenocarcinoma with lymph node involvement, peritoneal metastasis Recently diagnosed.  Follows with medical oncology Dr. Sherrod outpatient.  Has missed multiple outpatient appointments.  CT angiogram chest/abdomen/pelvis with findings of stable 3 cm posterior medial left upper lobe lung mass concerning for primary bronchogenic neoplasm, multiple bilateral smaller pulm nodules without significant change compatible with metastatic disease, stable mediastinal and bilateral hilar adenopathy consistent with metastatic disease, extensive peritoneal spread of disease more prominent over the anterior mid to lower  abdomen with slight interval progression of  the peritoneal disease, small amount of free fluid, 1.5 cm hypodensity lower pole cortex right kidney new since prior exams.  -- Needs close outpatient follow-up with medical oncology, pulmonology  DM2 Hemoglobin A1c 5.8% on 01/31/2024, well-controlled.  At baseline on metformin  5 mg p.o. twice daily. -- Hold oral hypoglycemics while inpatient -- Moderate SSI for coverage -- CBG before every meal/at bedtime  HTN Chronic diastolic congestive heart failure -- Lisinopril  40 mg  p.o. daily -- Hold home amlodipine  and furosemide  for now  Chronic hypoxic respiratory failure, stable COPD -- Breztri  2 puffs twice daily -- Albuterol  neb q4h PRN wheezing/shortness of breath -- Continue supplemental oxygen, and has been to greater than 88%, currently on baseline 2 L nasal cannula  Bipolar disorder -- Tegretol  400 mg PO BID  Cocaine use disorder Continues to endorse active cocaine use.  Counseled on need for complete cessation/abstinence  DVT prophylaxis:     Code Status: Full Code Family Communication: No family was at bedside this morning  Disposition Plan:  Level of care: Progressive Status is: Inpatient Remains inpatient appropriate because: Heparin  drip    Consultants:  None  Procedures:  TEE  Antimicrobials:  None   Subjective: Patient seen examined bedside, lying in bed.  No specific complaints this morning.  Denies any current chest pain.  Remains on heparin  drip.  Hemoglobin remained stable, will transition to Lovenox  is not a candidate for DOAC given Tegretol  use.  No other questions or concerns at this time.  Discussed need for complete cessation from cocaine.  Denies headache, no dizziness, no chest pain, no palpitations, no shortness of breath, no abdominal pain, no fever/chills/night sweats, no nausea/vomiting/diarrhea, no focal weakness, no fatigue, no paresthesia.  No acute events overnight per nurse  staff.  Objective: Vitals:   02/15/24 0850 02/15/24 0857 02/15/24 1103 02/15/24 1400  BP: 128/79  (!) 122/51 (!) 141/82  Pulse: 74  82 84  Resp: 17  (!) 21 17  Temp:  98.6 F (37 C) 98.2 F (36.8 C)   TempSrc:  Oral Oral   SpO2: 91%  95% 95%    Intake/Output Summary (Last 24 hours) at 02/15/2024 1408 Last data filed at 02/15/2024 1110 Gross per 24 hour  Intake 165.65 ml  Output --  Net 165.65 ml   There were no vitals filed for this visit.  Examination:  Physical Exam: GEN: NAD, alert and oriented x 3, wd/wn HEENT: NCAT, PERRL, EOMI, sclera clear, MMM PULM: CTAB w/o wheezes/crackles, normal respiratory effort, 2 L nasal cannula which is her baseline CV: RRR w/o M/G/R GI: abd soft, NTND, + BS MSK: no peripheral edema, muscle strength globally intact 5/5 bilateral upper/lower extremities NEURO: CN II-XII intact, no focal deficits, sensation to light touch intact PSYCH: normal mood/affect Integumentary: dry/intact, no rashes or wounds    Data Reviewed: I have personally reviewed following labs and imaging studies  CBC: Recent Labs  Lab 02/14/24 0812 02/14/24 0913  WBC 11.9*  --   HGB 14.5 14.6  HCT 42.9 43.0  MCV 91.1  --   PLT 270  --    Basic Metabolic Panel: Recent Labs  Lab 02/14/24 0812 02/14/24 0913  NA 136 134*  K 4.1 5.8*  CL 98 98  CO2 25  --   GLUCOSE 112* 107*  BUN 5* 6  CREATININE 0.63 0.70  CALCIUM  9.7  --    GFR: Estimated Creatinine Clearance: 103.6 mL/min (by C-G formula based on SCr of 0.7 mg/dL). Liver Function Tests: Recent  Labs  Lab 02/14/24 0859  AST 23  ALT 14  ALKPHOS 165*  BILITOT 0.4  PROT 7.1  ALBUMIN 4.3   Recent Labs  Lab 02/14/24 0859  LIPASE 17   No results for input(s): AMMONIA in the last 168 hours. Coagulation Profile: Recent Labs  Lab 02/14/24 0910  INR 0.9   Cardiac Enzymes: No results for input(s): CKTOTAL, CKMB, CKMBINDEX, TROPONINI in the last 168 hours. BNP (last 3 results) Recent  Labs    11/30/23 0908 01/30/24 2055  PROBNP <50.0 <50.0   HbA1C: No results for input(s): HGBA1C in the last 72 hours. CBG: Recent Labs  Lab 02/14/24 1300 02/14/24 1839 02/14/24 2203 02/15/24 0853 02/15/24 1143  GLUCAP 106* 126* 114* 143* 138*   Lipid Profile: No results for input(s): CHOL, HDL, LDLCALC, TRIG, CHOLHDL, LDLDIRECT in the last 72 hours. Thyroid  Function Tests: No results for input(s): TSH, T4TOTAL, FREET4, T3FREE, THYROIDAB in the last 72 hours. Anemia Panel: No results for input(s): VITAMINB12, FOLATE, FERRITIN, TIBC, IRON, RETICCTPCT in the last 72 hours. Sepsis Labs: No results for input(s): PROCALCITON, LATICACIDVEN in the last 168 hours.  No results found for this or any previous visit (from the past 240 hours).       Radiology Studies: VAS US  LOWER EXTREMITY VENOUS (DVT) Result Date: 02/15/2024  Lower Venous DVT Study Patient Name:  DEISI SALONGA  Date of Exam:   02/15/2024 Medical Rec #: [id number]                Accession #:    [id number] Date of Birth: 1970/10/04                Patient Gender: F Patient Age:   63 years Exam Location:  [facility] Procedure:      VAS US  LOWER EXTREMITY VENOUS (DVT) Referring Phys: MIR [facility] --------------------------------------------------------------------------------  Indications: Pulmonary embolism.  Comparison Study: Previous study on 1.8.2023 Performing Technologist: Edilia Elden Appl  Examination Guidelines: A complete evaluation includes B-mode imaging, spectral Doppler, color Doppler, and power Doppler as needed of all accessible portions of each vessel. Bilateral testing is considered an integral part of a complete examination. Limited examinations for reoccurring indications may be performed as noted. The reflux portion of the exam is performed with the patient in reverse Trendelenburg.   +---------+---------------+---------+-----------+----------+-------------------+ RIGHT    CompressibilityPhasicitySpontaneityPropertiesThrombus Aging      +---------+---------------+---------+-----------+----------+-------------------+ CFV      Full           Yes      Yes                                      +---------+---------------+---------+-----------+----------+-------------------+ SFJ      Full           Yes      Yes                                      +---------+---------------+---------+-----------+----------+-------------------+ FV Prox  Full                                                             +---------+---------------+---------+-----------+----------+-------------------+ FV Mid  Full                                                             +---------+---------------+---------+-----------+----------+-------------------+ FV DistalFull                                                             +---------+---------------+---------+-----------+----------+-------------------+ PFV      Full                                                             +---------+---------------+---------+-----------+----------+-------------------+ POP      Full           No       Yes                                      +---------+---------------+---------+-----------+----------+-------------------+ PTV      Full                                                             +---------+---------------+---------+-----------+----------+-------------------+ PERO                                                  Not well                                                                  visualized, patent                                                        by color.           +---------+---------------+---------+-----------+----------+-------------------+   +---------+---------------+---------+-----------+----------+-------------------+  LEFT     CompressibilityPhasicitySpontaneityPropertiesThrombus Aging      +---------+---------------+---------+-----------+----------+-------------------+ CFV      Full           Yes      Yes                                      +---------+---------------+---------+-----------+----------+-------------------+ SFJ      Full  Yes      Yes                                      +---------+---------------+---------+-----------+----------+-------------------+ FV Prox  Full                                                             +---------+---------------+---------+-----------+----------+-------------------+ FV Mid   Full                                                             +---------+---------------+---------+-----------+----------+-------------------+ FV DistalFull                                                             +---------+---------------+---------+-----------+----------+-------------------+ PFV      Full                                                             +---------+---------------+---------+-----------+----------+-------------------+ POP      Full           No       Yes                                      +---------+---------------+---------+-----------+----------+-------------------+ PTV                                                   Not well                                                                  visualized, patent                                                        by color.           +---------+---------------+---------+-----------+----------+-------------------+ PERO  Not well                                                                  visualized, patent                                                        by color.           +---------+---------------+---------+-----------+----------+-------------------+      Summary: RIGHT: - There is no evidence of deep vein thrombosis in the lower extremity. However, portions of this examination were limited- see technologist comments above.  - No cystic structure found in the popliteal fossa.  LEFT: - There is no evidence of deep vein thrombosis in the lower extremity. However, portions of this examination were limited- see technologist comments above.  - No cystic structure found in the popliteal fossa.  *See table(s) above for measurements and observations. Electronically signed by Norman Serve on 02/15/2024 at 1:11:37 PM.    Final    ECHOCARDIOGRAM COMPLETE Result Date: 02/15/2024    ECHOCARDIOGRAM REPORT   Patient Name:   SHANEYA TAKETA Date of Exam: 02/15/2024 Medical Rec #:  [id number]               Height:       65.0 in Accession #:    [id number]              Weight:       266.8 lb Date of Birth:  1970/11/28               BSA:          2.236 m Patient Age:    53 years                BP:           136/87 mmHg Patient Gender: F                       HR:           84 bpm. Exam Location:  Inpatient Procedure: 2D Echo, Cardiac Doppler, Color Doppler and Intracardiac            Opacification Agent (Both Spectral and Color Flow Doppler were            utilized during procedure). Indications:    CHF - Acute diastolic  History:        Patient has prior history of Echocardiogram examinations, most                 recent 01/18/2023.  Sonographer:    Odella Brewster Referring Phys: [id number] MIR [facility]  Sonographer Comments: Technically difficult study due to poor echo windows and patient is obese. Image acquisition challenging due to patient body habitus, Image acquisition challenging due to respiratory motion and patient movement throughout exam. IMPRESSIONS  1. Left ventricular ejection fraction, by estimation, is 60 to 65%. The left ventricle has normal function. The left ventricle has no regional wall motion abnormalities. There is mild concentric left ventricular  hypertrophy. Left ventricular diastolic parameters were  normal.  2. Right ventricular systolic function is normal. The right ventricular size is normal. Tricuspid regurgitation signal is inadequate for assessing PA pressure.  3. The mitral valve is normal in structure. No evidence of mitral valve regurgitation. No evidence of mitral stenosis.  4. The aortic valve is normal in structure. Aortic valve regurgitation is not visualized. No aortic stenosis is present.  5. There is borderline dilatation of the ascending aorta, measuring 36 mm.  6. The inferior vena cava is normal in size with greater than 50% respiratory variability, suggesting right atrial pressure of 3 mmHg. FINDINGS  Left Ventricle: Left ventricular ejection fraction, by estimation, is 60 to 65%. The left ventricle has normal function. The left ventricle has no regional wall motion abnormalities. Definity  contrast agent was given IV to delineate the left ventricular  endocardial borders. The left ventricular internal cavity size was normal in size. There is mild concentric left ventricular hypertrophy. Left ventricular diastolic parameters were normal. Right Ventricle: The right ventricular size is normal. No increase in right ventricular wall thickness. Right ventricular systolic function is normal. Tricuspid regurgitation signal is inadequate for assessing PA pressure. Left Atrium: Left atrial size was normal in size. Right Atrium: Right atrial size was normal in size. Pericardium: There is no evidence of pericardial effusion. Mitral Valve: The mitral valve is normal in structure. No evidence of mitral valve regurgitation. No evidence of mitral valve stenosis. MV peak gradient, 5.7 mmHg. The mean mitral valve gradient is 2.0 mmHg. Tricuspid Valve: The tricuspid valve is normal in structure. Tricuspid valve regurgitation is not demonstrated. No evidence of tricuspid stenosis. Aortic Valve: The aortic valve is normal in structure. Aortic valve  regurgitation is not visualized. Aortic regurgitation PHT measures 661 msec. No aortic stenosis is present. Aortic valve mean gradient measures 5.0 mmHg. Aortic valve peak gradient measures 9.6 mmHg. Aortic valve area, by VTI measures 2.67 cm. Pulmonic Valve: The pulmonic valve was normal in structure. Pulmonic valve regurgitation is not visualized. No evidence of pulmonic stenosis. Aorta: The aortic root is normal in size and structure. There is borderline dilatation of the ascending aorta, measuring 36 mm. Venous: The inferior vena cava is normal in size with greater than 50% respiratory variability, suggesting right atrial pressure of 3 mmHg. IAS/Shunts: No atrial level shunt detected by color flow Doppler.  LEFT VENTRICLE PLAX 2D LVIDd:         4.50 cm     Diastology LVIDs:         3.00 cm     LV e' medial:    6.74 cm/s LV PW:         1.20 cm     LV E/e' medial:  13.1 LV IVS:        1.10 cm     LV e' lateral:   11.50 cm/s LVOT diam:     2.00 cm     LV E/e' lateral: 7.7 LV SV:         67 LV SV Index:   30 LVOT Area:     3.14 cm LV IVRT:       85 msec  LV Volumes (MOD) LV vol d, MOD A2C: 88.8 ml LV vol d, MOD A4C: 87.9 ml LV vol s, MOD A2C: 34.7 ml LV vol s, MOD A4C: 33.1 ml LV SV MOD A2C:     54.1 ml LV SV MOD A4C:     87.9 ml LV SV MOD BP:      54.6 ml RIGHT  VENTRICLE             IVC RV S prime:     12.70 cm/s  IVC diam: 1.70 cm TAPSE (M-mode): 1.9 cm                             PULMONARY VEINS                             Diastolic Velocity: 27.90 cm/s                             S/D Velocity:       1.40                             Systolic Velocity:  37.90 cm/s LEFT ATRIUM             Index        RIGHT ATRIUM           Index LA diam:        3.50 cm 1.57 cm/m   RA Area:     14.10 cm LA Vol (A2C):   41.1 ml 18.38 ml/m  RA Volume:   31.70 ml  14.18 ml/m LA Vol (A4C):   33.8 ml 15.12 ml/m LA Biplane Vol: 37.2 ml 16.64 ml/m  AORTIC VALVE                     PULMONIC VALVE AV Area (Vmax):    2.43 cm      PV  Vmax:       1.03 m/s AV Area (Vmean):   2.56 cm      PV Peak grad:  4.2 mmHg AV Area (VTI):     2.67 cm AV Vmax:           155.00 cm/s AV Vmean:          106.000 cm/s AV VTI:            0.251 m AV Peak Grad:      9.6 mmHg AV Mean Grad:      5.0 mmHg LVOT Vmax:         120.00 cm/s LVOT Vmean:        86.400 cm/s LVOT VTI:          0.213 m LVOT/AV VTI ratio: 0.85 AI PHT:            661 msec  AORTA Ao Root diam: 3.40 cm Ao Asc diam:  3.60 cm MITRAL VALVE MV Area (PHT): 2.87 cm     SHUNTS MV Area VTI:   2.69 cm     Systemic VTI:  0.21 m MV Peak grad:  5.7 mmHg     Systemic Diam: 2.00 cm MV Mean grad:  2.0 mmHg MV Vmax:       1.19 m/s MV Vmean:      73.1 cm/s MV Decel Time: 264 msec MV E velocity: 88.60 cm/s MV A velocity: 104.00 cm/s MV E/A ratio:  0.85 Kardie Tobb DO Electronically signed by Dub Huntsman DO Signature Date/Time: 02/15/2024/10:59:55 AM    Final    CT Angio Chest/Abd/Pel for Dissection W and/or Wo Contrast Result Date: 02/14/2024 CLINICAL DATA:  Acute aortic syndrome suspected. Sudden onset chest pain and flank pain bilaterally last night and  this morning. Recent diagnosis of lung cancer. EXAM: CT ANGIOGRAPHY CHEST, ABDOMEN AND PELVIS TECHNIQUE: Non-contrast CT of the chest was initially obtained. Multidetector CT imaging through the chest, abdomen and pelvis was performed using the standard protocol during bolus administration of intravenous contrast. Multiplanar reconstructed images and MIPs were obtained and reviewed to evaluate the vascular anatomy. RADIATION DOSE REDUCTION: This exam was performed according to the departmental dose-optimization program which includes automated exposure control, adjustment of the mA and/or kV according to patient size and/or use of iterative reconstruction technique. CONTRAST:  OMNIPAQUE  IOHEXOL  350 MG/ML SOLN COMPARISON:  01/31/2024, 11/30/2023 and CT abdomen 06/23/2023 FINDINGS: CTA CHEST FINDINGS Cardiovascular: Borderline stable cardiomegaly. Thoracic  aorta is normal in caliber. Mild calcified plaque over the descending thoracic aorta. No evidence of aortic aneurysm or dissection. Pulmonary arterial system is well opacified and demonstrates multiple segmental and subsegmental emboli predominately over the right lower lobe and minimally over the right middle and upper lobes. No significant left-sided pulmonary emboli visualized. Remaining vascular structures are unremarkable. Mediastinum/Nodes: Stable 1.3 cm prevascular lymph node. 1.1 cm right paratracheal lymph node without significant change. 1.4 cm precarinal lymph node unchanged. 1.7 cm AP window lymph node without significant change. 1.3 cm subcarinal lymph node. Evidence of bilateral hilar adenopathy. Remaining mediastinal structures are unremarkable. Lungs/Pleura: Lungs are adequately inflated. No significant change in a posteromedial left upper lobe lung mass measuring 2 x 3 cm concerning for primary bronchogenic neoplasm. Numerous bilateral smaller pulmonary nodules without significant change compatible with metastatic disease. No acute airspace process. No significant effusion. Airways are unremarkable. Musculoskeletal: No focal abnormality. Review of the MIP images confirms the above findings. CTA ABDOMEN AND PELVIS FINDINGS VASCULAR Aorta: Mild calcified plaque over the abdominal aorta which is normal in caliber. Celiac: Moderate focal narrowing of the origin of the celiac axis with there is mild calcified plaque present. Celiac artery is otherwise patent. SMA: Patent without evidence of aneurysm, dissection, vasculitis or significant stenosis. Renals: Both renal arteries are patent without evidence of aneurysm, dissection, vasculitis, fibromuscular dysplasia or significant stenosis. IMA: Patent without evidence of aneurysm, dissection, vasculitis or significant stenosis. Inflow: Patent without evidence of aneurysm, dissection, vasculitis or significant stenosis. Veins: No obvious venous abnormality  within the limitations of this arterial phase study. Review of the MIP images confirms the above findings. NON-VASCULAR Hepatobiliary: Liver, gallbladder and biliary tree are normal. Pancreas: Normal. Spleen: Normal. Adrenals/Urinary Tract: Adrenal glands are normal. Kidneys are normal in size without hydronephrosis or nephrolithiasis. 1.5 cm hypodensity over the lower pole cortex right kidney with Hounsfield unit measurements of 38 new since the prior exams. This is indeterminate. Ureters and bladder are normal. Stomach/Bowel: Stomach and small bowel are normal. Appendix is normal. There is mild diverticulosis of the colon which is otherwise unremarkable. Lymphatic: No significant adenopathy. Reproductive: Stable calcified uterine fibroid. No adnexal regions are unremarkable. Other: Continued evidence of extensive peritoneal spread of disease more prominent over the anterior mid to lower abdomen with largest focus being a conglomerate mass over the anterior left lower quadrant measuring approximately 5 x 6.6 cm. Slight interval progression of this peritoneal disease. Small amount of free fluid in the pelvis. Musculoskeletal: No focal abnormality. Review of the MIP images confirms the above findings. IMPRESSION: 1. No evidence of aortic aneurysm or dissection. 2. Multiple segmental and subsegmental pulmonary emboli over the right lower lobe and minimally over the right middle and upper lobes. 3. 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 Numerous bilateral smaller pulmonary nodules without significant change compatible with metastatic disease. 4. Stable mediastinal and bilateral hilar adenopathy compatible with metastatic disease. 5. Continued evidence of extensive peritoneal spread of disease more prominent over the anterior mid to lower abdomen with slight interval progression of this peritoneal disease. Small amount of free fluid in the pelvis. 6. 1.5 cm  hypodensity over the lower pole cortex right kidney new since the prior exams. This is indeterminate. Recommend attention on follow-up. 7. Colonic diverticulosis. 8. Aortic atherosclerosis. Atherosclerotic coronary artery disease. Aortic Atherosclerosis (ICD10-I70.0). Critical Value/emergent results were called by telephone at the time of interpretation on 02/14/2024 at 10:05 am to provider JOSHUA LONG , who verbally acknowledged these results. Electronically Signed   By: Toribio Agreste M.D.   On: 02/14/2024 10:05        Scheduled Meds:  budesonide -glycopyrrolate -formoterol   2 puff Inhalation BID   carbamazepine   400 mg Oral BID   gabapentin   600 mg Oral BID   insulin  aspart  0-15 Units Subcutaneous TID WC   insulin  aspart  0-5 Units Subcutaneous QHS   latanoprost   1 drop Both Eyes QHS   lisinopril   30 mg Oral Daily   Continuous Infusions:  heparin  1,300 Units/hr (02/15/24 0757)     LOS: 1 day    Time spent: 52 minutes spent on 02/15/2024 caring for this patient face-to-face including chart review, ordering labs/tests, documenting, discussion with nursing staff, consultants, updating family and interview/physical exam    Camellia PARAS Leor Whyte, DO [facility] Available via Epic secure chat 7am-7pm After these hours, please refer to coverage provider listed on [email or web address] 02/15/2024, 2:08 PM   "

## 2024-02-15 NOTE — Telephone Encounter (Signed)
 Patient Product/process Development Scientist completed.    The patient is insured through [facility] MEDICAID.     Ran test claim for enoxaparin  100 mg/ml and the current 30 day co-pay is $4.00.   This test claim was processed through [facility]- copay amounts may vary at other pharmacies due to pharmacy/plan contracts, or as the patient moves through the different stages of their insurance plan.     Reyes Sharps, CPHT Pharmacy Technician Patient Advocate Specialist Lead [facility] Patient Advocate Team Direct Number: [phone]  Fax: [phone]

## 2024-02-15 NOTE — Progress Notes (Signed)
" °   02/15/24 2214  BiPAP/CPAP/SIPAP  BiPAP/CPAP/SIPAP Pt Type Adult  BiPAP/CPAP/SIPAP Resmed  Mask Type Full face mask  Dentures removed? Not applicable  Mask Size Large  Respiratory Rate 17 breaths/min  Flow Rate 2 lpm (bled into circuit)  Patient Home Machine No  Patient Home Mask No  Patient Home Tubing No  Auto Titrate Yes  Minimum cmH2O 5 cmH2O  Maximum cmH2O 20 cmH2O  CPAP/SIPAP surface wiped down Yes  Device Plugged into RED Power Outlet Yes  BiPAP/CPAP /SiPAP Vitals  Resp 17  MEWS Score/Color  MEWS Score 0  MEWS Score Color Green    "

## 2024-02-16 DIAGNOSIS — F1410 Cocaine abuse, uncomplicated: Secondary | ICD-10-CM | POA: Diagnosis not present

## 2024-02-16 DIAGNOSIS — I2699 Other pulmonary embolism without acute cor pulmonale: Secondary | ICD-10-CM | POA: Diagnosis not present

## 2024-02-16 LAB — BASIC METABOLIC PANEL WITH GFR
Anion gap: 11 (ref 5–15)
BUN: 5 mg/dL — ABNORMAL LOW (ref 6–20)
CO2: 25 mmol/L (ref 22–32)
Calcium: 9.3 mg/dL (ref 8.9–10.3)
Chloride: 103 mmol/L (ref 98–111)
Creatinine, Ser: 0.6 mg/dL (ref 0.44–1.00)
GFR, Estimated: >60 mL/min
Glucose, Bld: 149 mg/dL — ABNORMAL HIGH (ref 70–99)
Potassium: 3.9 mmol/L (ref 3.5–5.1)
Sodium: 138 mmol/L (ref 135–145)

## 2024-02-16 LAB — GLUCOSE, CAPILLARY
Glucose-Capillary: 112 mg/dL — ABNORMAL HIGH (ref 70–99)
Glucose-Capillary: 118 mg/dL — ABNORMAL HIGH (ref 70–99)
Glucose-Capillary: 108 mg/dL — ABNORMAL HIGH (ref 70–99)
Glucose-Capillary: 123 mg/dL — ABNORMAL HIGH (ref 70–99)

## 2024-02-16 LAB — CBC
HCT: 38 % (ref 36.0–46.0)
Hemoglobin: 12 g/dL (ref 12.0–15.0)
MCH: 30.1 pg (ref 26.0–34.0)
MCHC: 31.6 g/dL (ref 30.0–36.0)
MCV: 95.2 fL (ref 80.0–100.0)
Platelets: 249 K/uL (ref 150–400)
RBC: 3.99 MIL/uL (ref 3.87–5.11)
RDW: 14.2 % (ref 11.5–15.5)
WBC: 7.8 K/uL (ref 4.0–10.5)
nRBC: 0 % (ref 0.0–0.2)

## 2024-02-16 LAB — MAGNESIUM: Magnesium: 2.1 mg/dL (ref 1.7–2.4)

## 2024-02-16 NOTE — Plan of Care (Signed)

## 2024-02-16 NOTE — Progress Notes (Signed)
 " PROGRESS NOTE    Denise [name]  FMW:[id number] DOB: 25-Mar-1970 DOA: 02/14/2024 PCP: Duwaine Annabella SAILOR, FNP    Brief Narrative:   Denise [name] is a 54 y.o. 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  Patient has been self-medicating with cocaine for pain.  Reports no previous reaction to cocaine use like this in the past.  She has had several missed follow-up appointments outpatient for further evaluation and management of her new lung adenocarcinoma.  In the ED, temperature 98.6 F, HR 15, RR 151/84, SpO2 96% on 2 L nasal cannula.  WBC 11.9, hemoglobin 14.5, platelet count 270.  Sodium 136, potassium 4.1, chloride 98, CO2 25, glucose 112, BUN 5, creatinine 0.63.  Lipase 17.  AST 23, ALT 14, total bilirubin 0.3.  INR 0.9.  EKG with normal sinus rhythm, rate 85, QTc 477 without any concerning dynamic changes to include ST segment elevation/depression or T wave inversion.  CT angiogram chest/abdomen/pelvis with contrast with no evidence of aortic aneurysm/dissection, multiple segmental and subsegmental pulmonary emboli over the right lower lobe and minimally over the right middle lobe and upper lobes; stable 3 cm posterior medial left upper lobe lung mass concerning for primary bronchogenic neoplasm, multiple bilateral smaller pulm nodules without significant change compatible with metastatic disease, stable mediastinal and bilateral hilar adenopathy consistent with metastatic disease, extensive peritoneal spread of disease more prominent over the anterior mid to lower abdomen with slight interval progression of the peritoneal disease, small amount of free fluid,  1.5 cm hypodensity lower pole cortex right kidney new since prior exams, aortic atherosclerosis, colonic diverticulosis.  Patient was initially started on Lovenox  but changed to heparin  drip given history of rectal bleeding.  TRH consulted for admission for further evaluation and management of acute pulmonary embolism.  Assessment & Plan:   Acute pulmonary embolism Patient presenting with pleuritic chest pain, shortness of breath.  Patient is afebrile with slightly elevated WBC count of 11.9.  CT angiogram chest/abdomen/pelvis with findings of multiple segmental and subsegmental pulmonary emboli over the right lower lobe and minimally over the right middle lobe and upper lobes.  TTE with LVEF 60 to 65%, mild concentric LVH, normal diastolic parameters, no aortic stenosis, borderline dilation ascending aorta measuring 36 mm, IVC normal in size.  Vascular duplex was found bilateral lower extremities negative for DVT. -- Heparin  drip, pharmacy consulted for dosing/monitoring -- Not a candidate for DOAC given Tegretol  use for bipolar disorder -- Hgb 14.2>13.2>12.0 (13.0 on 01/30/2024) -- Lovenox  120 mg [place] BID -- RN for teaching/administration, patient reports unable and will have son/husband perform -- CBC in the am -- Strict I's and O's, monitor for blood in bowel movements -- Outpatient follow-up with hematology/medical oncology  Hyperkalemia Potassium 5.8; likely lab error with potassium 3.9 this morning.  Hypomagnesemia: Resolved Magnesium  1.6.  Lung adenocarcinoma with lymph node involvement, peritoneal metastasis Recently diagnosed.  Follows with medical oncology Dr. Sherrod outpatient.  Has missed multiple outpatient appointments.  CT angiogram chest/abdomen/pelvis with findings of stable 3 cm posterior medial left upper lobe lung mass concerning for primary bronchogenic neoplasm, multiple bilateral smaller pulm nodules without significant change compatible with metastatic disease, stable  mediastinal and bilateral hilar adenopathy consistent with metastatic disease,  extensive peritoneal spread of disease more prominent over the anterior mid to lower abdomen with slight interval progression of the peritoneal disease, small amount of free fluid, 1.5 cm hypodensity lower pole cortex right kidney new since prior exams.  -- Needs close outpatient follow-up with medical oncology, pulmonology  DM2 Hemoglobin A1c 5.8% on 01/31/2024, well-controlled.  At baseline on metformin  500 mg p.o. twice daily. -- Hold oral hypoglycemics while inpatient -- Moderate SSI for coverage -- CBG before every meal/at bedtime  HTN Chronic diastolic congestive heart failure -- Lisinopril  40 mg  p.o. daily -- Hold home amlodipine  and furosemide  for now  Chronic hypoxic respiratory failure, stable COPD -- Breztri  2 puffs twice daily -- Albuterol  neb q4h PRN wheezing/shortness of breath -- Continue supplemental oxygen, and has been to greater than 88%, currently on baseline 2 L nasal cannula  Bipolar disorder -- Tegretol  400 mg PO BID  Cocaine use disorder Continues to endorse active cocaine use.  Counseled on need for complete cessation/abstinence  DVT prophylaxis:     Code Status: Full Code Family Communication: No family was at bedside this morning  Disposition Plan:  Level of care: Telemetry Status is: Inpatient Remains inpatient appropriate because: Needs teaching in regards to Lovenox  administration, anticipate discharge home tomorrow    Consultants:  None  Procedures:  TEE  Antimicrobials:  None   Subjective: Patient seen examined bedside, lying in bed.  Complaining of pleuritic pain on deep inspiration to her right side.  Discussed with her this is likely secondary to her pulmonary embolism, also consideration for pain of malignancy given peritoneal carcinomatosis as well.  Reports that she will not be able to administer the Lovenox  but her son or husband will be able to.  Will  need teaching, discussed with RN and pharmacist.   Discussed need for complete cessation from cocaine.  Denies headache, no dizziness, no chest pain, no palpitations, no shortness of breath, no abdominal pain, no fever/chills/night sweats, no nausea/vomiting/diarrhea, no focal weakness, no fatigue, no paresthesia.  No acute events overnight per nurse staff.  Objective: Vitals:   02/16/24 0800 02/16/24 0818 02/16/24 0900 02/16/24 1000  BP:      Pulse:      Resp: 19 12 18 15   Temp:      TempSrc:      SpO2:      Weight:      Height:        Intake/Output Summary (Last 24 hours) at 02/16/2024 1134 Last data filed at 02/16/2024 9188 Gross per 24 hour  Intake 838.5 ml  Output 500 ml  Net 338.5 ml   Filed Weights   02/15/24 1716  Weight: 122.3 kg    Examination:  Physical Exam: GEN: NAD, alert and oriented x 3, wd/wn HEENT: NCAT, PERRL, EOMI, sclera clear, MMM PULM: CTAB w/o wheezes/crackles, normal respiratory effort, 2 L nasal cannula which is her baseline CV: RRR w/o M/G/R GI: abd soft, NTND, + BS MSK: no peripheral edema, muscle strength globally intact 5/5 bilateral upper/lower extremities NEURO: CN II-XII intact, no focal deficits, sensation to light touch intact PSYCH: normal mood/affect Integumentary: dry/intact, no rashes or wounds    Data Reviewed: I have personally reviewed following labs and imaging studies  CBC: Recent Labs  Lab 02/14/24 0812 02/14/24 0913 02/15/24 1402 02/16/24 0417  WBC 11.9*  --  10.6* 7.8  HGB 14.5 14.6 13.2 12.0  HCT 42.9 43.0 39.1 38.0  MCV 91.1  --  92.4 95.2  PLT 270  --  278 249   Basic Metabolic Panel: Recent Labs  Lab 02/14/24 0812 02/14/24 0913 02/15/24 1402 02/16/24 0417  NA 136 134* 138 138  K 4.1 5.8* 4.3 3.9  CL 98 98 101 103  CO2 25  --  24 25  GLUCOSE 112* 107* 122* 149*  BUN 5* 6 6 5*  CREATININE 0.63 0.70 0.61 0.60  CALCIUM  9.7  --  9.8 9.3  MG  --   --   --  2.1   GFR: Estimated Creatinine Clearance:  104.2 mL/min (by C-G formula based on SCr of 0.6 mg/dL). Liver Function Tests: Recent Labs  Lab 02/14/24 0859  AST 23  ALT 14  ALKPHOS 165*  BILITOT 0.4  PROT 7.1  ALBUMIN 4.3   Recent Labs  Lab 02/14/24 0859  LIPASE 17   No results for input(s): AMMONIA in the last 168 hours. Coagulation Profile: Recent Labs  Lab 02/14/24 0910  INR 0.9   Cardiac Enzymes: No results for input(s): CKTOTAL, CKMB, CKMBINDEX, TROPONINI in the last 168 hours. BNP (last 3 results) Recent Labs    11/30/23 0908 01/30/24 2055  PROBNP <50.0 <50.0   HbA1C: No results for input(s): HGBA1C in the last 72 hours. CBG: Recent Labs  Lab 02/15/24 0853 02/15/24 1143 02/15/24 1812 02/15/24 2108 02/16/24 0737  GLUCAP 143* 138* 143* 115* 112*   Lipid Profile: No results for input(s): CHOL, HDL, LDLCALC, TRIG, CHOLHDL, LDLDIRECT in the last 72 hours. Thyroid  Function Tests: No results for input(s): TSH, T4TOTAL, FREET4, T3FREE, THYROIDAB in the last 72 hours. Anemia Panel: No results for input(s): VITAMINB12, FOLATE, FERRITIN, TIBC, IRON, RETICCTPCT in the last 72 hours. Sepsis Labs: No results for input(s): PROCALCITON, LATICACIDVEN in the last 168 hours.  No results found for this or any previous visit (from the past 240 hours).       Radiology Studies: VAS US  LOWER EXTREMITY VENOUS (DVT) Result Date: 02/15/2024  Lower Venous DVT Study Patient Name:  Denise [name]  Date of Exam:   02/15/2024 Medical Rec #: [id number]                Accession #:    [id number] Date of Birth: [birth year]                Patient Gender: F Patient Age:   54 years Exam Location:  [facility] Procedure:      VAS US  LOWER EXTREMITY VENOUS (DVT) Referring Phys: MIR [facility] --------------------------------------------------------------------------------  Indications: Pulmonary embolism.  Comparison Study: Previous study on 1.8.2023 Performing  Technologist: Edilia Elden Appl  Examination Guidelines: A complete evaluation includes B-mode imaging, spectral Doppler, color Doppler, and power Doppler as needed of all accessible portions of each vessel. Bilateral testing is considered an integral part of a complete examination. Limited examinations for reoccurring indications may be performed as noted. The reflux portion of the exam is performed with the patient in reverse Trendelenburg.  +---------+---------------+---------+-----------+----------+-------------------+ RIGHT    CompressibilityPhasicitySpontaneityPropertiesThrombus Aging      +---------+---------------+---------+-----------+----------+-------------------+ CFV      Full           Yes      Yes                                      +---------+---------------+---------+-----------+----------+-------------------+ SFJ      Full           Yes      Yes                                      +---------+---------------+---------+-----------+----------+-------------------+  FV Prox  Full                                                             +---------+---------------+---------+-----------+----------+-------------------+ FV Mid   Full                                                             +---------+---------------+---------+-----------+----------+-------------------+ FV DistalFull                                                             +---------+---------------+---------+-----------+----------+-------------------+ PFV      Full                                                             +---------+---------------+---------+-----------+----------+-------------------+ POP      Full           No       Yes                                      +---------+---------------+---------+-----------+----------+-------------------+ PTV      Full                                                              +---------+---------------+---------+-----------+----------+-------------------+ PERO                                                  Not well                                                                  visualized, patent                                                        by color.           +---------+---------------+---------+-----------+----------+-------------------+   +---------+---------------+---------+-----------+----------+-------------------+ LEFT     CompressibilityPhasicitySpontaneityPropertiesThrombus Aging      +---------+---------------+---------+-----------+----------+-------------------+ CFV  Full           Yes      Yes                                      +---------+---------------+---------+-----------+----------+-------------------+ SFJ      Full           Yes      Yes                                      +---------+---------------+---------+-----------+----------+-------------------+ FV Prox  Full                                                             +---------+---------------+---------+-----------+----------+-------------------+ FV Mid   Full                                                             +---------+---------------+---------+-----------+----------+-------------------+ FV DistalFull                                                             +---------+---------------+---------+-----------+----------+-------------------+ PFV      Full                                                             +---------+---------------+---------+-----------+----------+-------------------+ POP      Full           No       Yes                                      +---------+---------------+---------+-----------+----------+-------------------+ PTV                                                   Not well                                                                  visualized, patent  by color.           +---------+---------------+---------+-----------+----------+-------------------+ PERO                                                  Not well                                                                  visualized, patent                                                        by color.           +---------+---------------+---------+-----------+----------+-------------------+     Summary: RIGHT: - There is no evidence of deep vein thrombosis in the lower extremity. However, portions of this examination were limited- see technologist comments above.  - No cystic structure found in the popliteal fossa.  LEFT: - There is no evidence of deep vein thrombosis in the lower extremity. However, portions of this examination were limited- see technologist comments above.  - No cystic structure found in the popliteal fossa.  *See table(s) above for measurements and observations. Electronically signed by Norman Serve on 02/15/2024 at 1:11:37 PM.    Final    ECHOCARDIOGRAM COMPLETE Result Date: 02/15/2024    ECHOCARDIOGRAM REPORT   Patient Name:   Denise [name] Date of Exam: 02/15/2024 Medical Rec #:  [id number]               Height:       65.0 in Accession #:    [id number]              Weight:       266.8 lb Date of Birth:  March 09, 1970               BSA:          2.236 m Patient Age:    53 years                BP:           136/87 mmHg Patient Gender: F                       HR:           84 bpm. Exam Location:  Inpatient Procedure: 2D Echo, Cardiac Doppler, Color Doppler and Intracardiac            Opacification Agent (Both Spectral and Color Flow Doppler were            utilized during procedure). Indications:    CHF - Acute diastolic  History:        Patient has prior history of Echocardiogram examinations, most                 recent 01/18/2023.  Sonographer:    Odella Brewster Referring Phys: [id number] MIR M  [facility]  Sonographer Comments: Technically difficult study due to poor echo windows and patient  is obese. Image acquisition challenging due to patient body habitus, Image acquisition challenging due to respiratory motion and patient movement throughout exam. IMPRESSIONS  1. Left ventricular ejection fraction, by estimation, is 60 to 65%. The left ventricle has normal function. The left ventricle has no regional wall motion abnormalities. There is mild concentric left ventricular hypertrophy. Left ventricular diastolic parameters were normal.  2. Right ventricular systolic function is normal. The right ventricular size is normal. Tricuspid regurgitation signal is inadequate for assessing PA pressure.  3. The mitral valve is normal in structure. No evidence of mitral valve regurgitation. No evidence of mitral stenosis.  4. The aortic valve is normal in structure. Aortic valve regurgitation is not visualized. No aortic stenosis is present.  5. There is borderline dilatation of the ascending aorta, measuring 36 mm.  6. The inferior vena cava is normal in size with greater than 50% respiratory variability, suggesting right atrial pressure of 3 mmHg. FINDINGS  Left Ventricle: Left ventricular ejection fraction, by estimation, is 60 to 65%. The left ventricle has normal function. The left ventricle has no regional wall motion abnormalities. Definity  contrast agent was given IV to delineate the left ventricular  endocardial borders. The left ventricular internal cavity size was normal in size. There is mild concentric left ventricular hypertrophy. Left ventricular diastolic parameters were normal. Right Ventricle: The right ventricular size is normal. No increase in right ventricular wall thickness. Right ventricular systolic function is normal. Tricuspid regurgitation signal is inadequate for assessing PA pressure. Left Atrium: Left atrial size was normal in size. Right Atrium: Right atrial size was normal in size.  Pericardium: There is no evidence of pericardial effusion. Mitral Valve: The mitral valve is normal in structure. No evidence of mitral valve regurgitation. No evidence of mitral valve stenosis. MV peak gradient, 5.7 mmHg. The mean mitral valve gradient is 2.0 mmHg. Tricuspid Valve: The tricuspid valve is normal in structure. Tricuspid valve regurgitation is not demonstrated. No evidence of tricuspid stenosis. Aortic Valve: The aortic valve is normal in structure. Aortic valve regurgitation is not visualized. Aortic regurgitation PHT measures 661 msec. No aortic stenosis is present. Aortic valve mean gradient measures 5.0 mmHg. Aortic valve peak gradient measures 9.6 mmHg. Aortic valve area, by VTI measures 2.67 cm. Pulmonic Valve: The pulmonic valve was normal in structure. Pulmonic valve regurgitation is not visualized. No evidence of pulmonic stenosis. Aorta: The aortic root is normal in size and structure. There is borderline dilatation of the ascending aorta, measuring 36 mm. Venous: The inferior vena cava is normal in size with greater than 50% respiratory variability, suggesting right atrial pressure of 3 mmHg. IAS/Shunts: No atrial level shunt detected by color flow Doppler.  LEFT VENTRICLE PLAX 2D LVIDd:         4.50 cm     Diastology LVIDs:         3.00 cm     LV e' medial:    6.74 cm/s LV PW:         1.20 cm     LV E/e' medial:  13.1 LV IVS:        1.10 cm     LV e' lateral:   11.50 cm/s LVOT diam:     2.00 cm     LV E/e' lateral: 7.7 LV SV:         67 LV SV Index:   30 LVOT Area:     3.14 cm LV IVRT:       85 msec  LV  Volumes (MOD) LV vol d, MOD A2C: 88.8 ml LV vol d, MOD A4C: 87.9 ml LV vol s, MOD A2C: 34.7 ml LV vol s, MOD A4C: 33.1 ml LV SV MOD A2C:     54.1 ml LV SV MOD A4C:     87.9 ml LV SV MOD BP:      54.6 ml RIGHT VENTRICLE             IVC RV S prime:     12.70 cm/s  IVC diam: 1.70 cm TAPSE (M-mode): 1.9 cm                             PULMONARY VEINS                             Diastolic  Velocity: 27.90 cm/s                             S/D Velocity:       1.40                             Systolic Velocity:  37.90 cm/s LEFT ATRIUM             Index        RIGHT ATRIUM           Index LA diam:        3.50 cm 1.57 cm/m   RA Area:     14.10 cm LA Vol (A2C):   41.1 ml 18.38 ml/m  RA Volume:   31.70 ml  14.18 ml/m LA Vol (A4C):   33.8 ml 15.12 ml/m LA Biplane Vol: 37.2 ml 16.64 ml/m  AORTIC VALVE                     PULMONIC VALVE AV Area (Vmax):    2.43 cm      PV Vmax:       1.03 m/s AV Area (Vmean):   2.56 cm      PV Peak grad:  4.2 mmHg AV Area (VTI):     2.67 cm AV Vmax:           155.00 cm/s AV Vmean:          106.000 cm/s AV VTI:            0.251 m AV Peak Grad:      9.6 mmHg AV Mean Grad:      5.0 mmHg LVOT Vmax:         120.00 cm/s LVOT Vmean:        86.400 cm/s LVOT VTI:          0.213 m LVOT/AV VTI ratio: 0.85 AI PHT:            661 msec  AORTA Ao Root diam: 3.40 cm Ao Asc diam:  3.60 cm MITRAL VALVE MV Area (PHT): 2.87 cm     SHUNTS MV Area VTI:   2.69 cm     Systemic VTI:  0.21 m MV Peak grad:  5.7 mmHg     Systemic Diam: 2.00 cm MV Mean grad:  2.0 mmHg MV Vmax:       1.19 m/s MV Vmean:      73.1 cm/s MV Decel Time: 264 msec MV E velocity:  88.60 cm/s MV A velocity: 104.00 cm/s MV E/A ratio:  0.85 Kardie Tobb DO Electronically signed by Kardie Tobb DO Signature Date/Time: 02/15/2024/10:59:55 AM    Final         Scheduled Meds:  budesonide -glycopyrrolate -formoterol   2 puff Inhalation BID   carbamazepine   400 mg Oral BID   enoxaparin  (LOVENOX ) injection  120 mg Subcutaneous Q12H   gabapentin   600 mg Oral BID   insulin  aspart  0-15 Units Subcutaneous TID WC   insulin  aspart  0-5 Units Subcutaneous QHS   latanoprost   1 drop Both Eyes QHS   lisinopril   30 mg Oral Daily   Continuous Infusions:     LOS: 2 days    Time spent: 47 minutes spent on 02/16/2024 caring for this patient face-to-face including chart review, ordering labs/tests, documenting, discussion with  nursing staff, consultants, updating family and interview/physical exam    Camellia PARAS Jolin Benavides, DO [facility] Available via Epic secure chat 7am-7pm After these hours, please refer to coverage provider listed on [email or web address] 02/16/2024, 11:34 AM   "

## 2024-02-16 NOTE — Progress Notes (Addendum)
" °   02/16/24 2147  BiPAP/CPAP/SIPAP  BiPAP/CPAP/SIPAP Pt Type Adult  BiPAP/CPAP/SIPAP Resmed (self adm)  Mask Type Full face mask  Dentures removed? Not applicable  Mask Size Large  Respiratory Rate 21 breaths/min  Flow Rate 2L  Patient Home Machine No  Patient Home Mask No  Patient Home Tubing No  Auto Titrate Yes  Minimum cmH2O 5 cmH2O  Maximum cmH2O 20 cmH2O  CPAP/SIPAP surface wiped down Yes  Device Plugged into RED Power Outlet Yes  BiPAP/CPAP /SiPAP Vitals  Resp (!) 21  MEWS Score/Color  MEWS Score 1  MEWS Score Color Green    "

## 2024-02-16 NOTE — Progress Notes (Signed)
 Patient reports improvement to abdominal pain after successful BM today.

## 2024-02-16 NOTE — Evaluation (Signed)
 Physical Therapy Evaluation Patient Details Name: Denise [name] MRN: [id number] DOB: 04/18/1970 Today's Date: 02/16/2024  History of Present Illness  Pt presents with chest pain and dyspnea. 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 Pt did present with some impulsivity with mobility, but no LOB identified. Pt does use an electric scooter for shopping and is at her baseline with mobility. Pt lives with her husband and son in a small 1 bedroom apt. Pt has a RW and no further PT needs identified. Pt is d/c from acute PT services.       If plan is discharge home, recommend the following:     Can travel by private vehicle        Equipment Recommendations None recommended by PT  Recommendations for Other Services       Functional Status Assessment Patient has not had a recent decline in their functional status     Precautions / Restrictions Precautions Precautions: Fall Recall of Precautions/Restrictions: Intact Restrictions Weight Bearing Restrictions Per Provider Order: No      Mobility  Bed Mobility Overal bed mobility: Independent               Patient Response: Impulsive  Transfers Overall transfer level: Independent                      Ambulation/Gait Ambulation/Gait assistance: Independent Gait Distance (Feet): 40 Feet Assistive device: None Gait Pattern/deviations: Step-through pattern          Stairs            Wheelchair Mobility     Tilt Bed Tilt Bed Patient Response: Impulsive  Modified Rankin (Stroke Patients Only)       Balance Overall balance assessment: No apparent balance deficits (not formally assessed)                                            Pertinent Vitals/Pain Pain Assessment Pain Assessment: 0-10 Pain Score: 3  Pain Location: flank Pain Descriptors / Indicators: Discomfort Pain Intervention(s): Limited activity within patient's tolerance, Monitored during session    Home Living Family/patient expects to be discharged to:: Private residence Living Arrangements: Spouse/significant other Available Help at Discharge: Family Type of Home: Apartment Home Access: Level entry       Home Layout: One level Home Equipment: [employer] (2 wheels) Additional Comments: O2    Prior Function Prior Level of Function : Independent/Modified Independent             Mobility Comments: limited mobility, uses an electric cart at the stores, indep with short distance ambulation       Extremity/Trunk Assessment        Lower Extremity Assessment Lower Extremity Assessment: Overall WFL for tasks assessed    Cervical / Trunk Assessment Cervical / Trunk Assessment: Normal  Communication   Communication Communication: No apparent difficulties    Cognition Arousal: Alert Behavior During Therapy: WFL for tasks assessed/performed                             Following commands: Intact  Cueing Cueing Techniques: Verbal cues     General Comments General comments (skin integrity, edema, etc.): assisted her to the bathroom, demonstrated some impulsivity, but had no LOB with mobility    Exercises     Assessment/Plan    PT Assessment Patient does not need any further PT services  PT Problem List         PT Treatment Interventions      PT Goals (Current goals can be found in the Care Plan section)       Frequency       Co-evaluation               AM-PAC PT 6 Clicks Mobility  Outcome Measure Help needed turning from your back to your side while in a flat bed without using bedrails?: None Help needed moving from lying on your back to sitting on the side of a  flat bed without using bedrails?: None Help needed moving to and from a bed to a chair (including a wheelchair)?: None Help needed standing up from a chair using your arms (e.g., wheelchair or bedside chair)?: None Help needed to walk in hospital room?: None Help needed climbing 3-5 steps with a railing? : A Little 6 Click Score: 23    End of Session   Activity Tolerance: Patient tolerated treatment well Patient left: in bed;with call bell/phone within reach Nurse Communication: Mobility status PT Visit Diagnosis: Other abnormalities of gait and mobility (R26.89)    Time: 8868-8848 PT Time Calculation (min) (ACUTE ONLY): 20 min   Charges:   PT Evaluation $PT Eval Low Complexity: 1 Low   PT General Charges $$ ACUTE PT VISIT: 1 Visit         Severus Brodzinski Kerstine 02/16/2024, 11:57 AM

## 2024-02-17 ENCOUNTER — Encounter (HOSPITAL_COMMUNITY): Payer: Self-pay

## 2024-02-17 DIAGNOSIS — I2699 Other pulmonary embolism without acute cor pulmonale: Secondary | ICD-10-CM | POA: Diagnosis not present

## 2024-02-17 LAB — CBC
HCT: 36.2 % (ref 36.0–46.0)
Hemoglobin: 11.9 g/dL — ABNORMAL LOW (ref 12.0–15.0)
MCH: 30.7 pg (ref 26.0–34.0)
MCHC: 32.9 g/dL (ref 30.0–36.0)
MCV: 93.3 fL (ref 80.0–100.0)
Platelets: 281 K/uL (ref 150–400)
RBC: 3.88 MIL/uL (ref 3.87–5.11)
RDW: 14.3 % (ref 11.5–15.5)
WBC: 7.8 K/uL (ref 4.0–10.5)
nRBC: 0 % (ref 0.0–0.2)

## 2024-02-17 LAB — GLUCOSE, CAPILLARY
Glucose-Capillary: 108 mg/dL — ABNORMAL HIGH (ref 70–99)
Glucose-Capillary: 115 mg/dL — ABNORMAL HIGH (ref 70–99)

## 2024-02-17 MED ORDER — ENOXAPARIN SODIUM 120 MG/0.8ML IJ SOSY: 120.0000 mg | PREFILLED_SYRINGE | Freq: Two times a day (BID) | INTRAMUSCULAR | 2 refills | Status: AC

## 2024-02-17 NOTE — Progress Notes (Signed)
 Discharge teaching complete. Meds, diet, activity, follow up appointments reviewed and all questions answered. Lovenox  teaching quickly went over with patient. Patient in a huge hurry to leave so I did the best I can with education because she did not want to wait. Prescription for Lovenox  sent to [facility] and patient says the pharmacy will deliver her medicine to the house today. Patient discharged to car to go home with niece via wheelchair.

## 2024-02-17 NOTE — Discharge Summary (Signed)
 " Physician Discharge Summary   Patient: Denise [name] MRN: [id number] DOB: 14-May-1970  Admit date:     02/14/2024  Discharge date: 02/17/24  Discharge Physician: Sabas GORMAN Brod   PCP: Duwaine Annabella SAILOR, FNP   Recommendations at discharge:   Follow-up oncology the patient Gave Lovenox  120 mg subcu twice daily  Discharge Diagnoses: Principal Problem:   Acute pulmonary embolism without acute cor pulmonale (HCC) Active Problems:   Pulmonary embolism (HCC)   Acute pulmonary embolism (HCC)  Resolved Problems:   * No resolved hospital problems. *  Hospital Course: 54 y.o. 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  Patient has been self-medicating with cocaine for pain.  Reports no previous reaction to cocaine use like this in the past.  She has had several missed follow-up appointments outpatient for further evaluation and management of her new lung adenocarcinoma.   In the ED, temperature 98.6 F, HR 15, RR 151/84, SpO2 96% on 2 L nasal cannula.  WBC 11.9, hemoglobin 14.5, platelet count 270.  Sodium 136, potassium 4.1, chloride 98, CO2 25, glucose 112, BUN 5, creatinine 0.63.  Lipase 17.  AST 23, ALT 14, total bilirubin 0.3.  INR 0.9.  EKG with normal sinus rhythm, rate 85, QTc 477 without any concerning dynamic changes to include ST segment elevation/depression or T wave inversion.  CT angiogram chest/abdomen/pelvis with contrast with no evidence of aortic aneurysm/dissection, multiple segmental and subsegmental pulmonary emboli over the right lower lobe and minimally over the right middle lobe and upper lobes; stable 3 cm posterior medial left upper lobe lung  mass concerning for primary bronchogenic neoplasm, multiple bilateral smaller pulm nodules without significant change compatible with metastatic disease, stable mediastinal and bilateral hilar adenopathy consistent with metastatic disease, extensive peritoneal spread of disease more prominent over the anterior mid to lower abdomen with slight interval progression of the peritoneal disease, small amount of free fluid, 1.5 cm hypodensity lower pole cortex right kidney new since prior exams, aortic atherosclerosis, colonic diverticulosis.  Patient was initially started on Lovenox  but changed to heparin  drip given history of rectal bleeding.  TRH consulted for admission for further evaluation and management of acute pulmonary embolism.    Assessment and Plan:  Acute pulmonary embolism Patient presenting with pleuritic chest pain, shortness of breath.  Patient is afebrile with slightly elevated WBC count of 11.9.  CT angiogram chest/abdomen/pelvis with findings of multiple segmental and subsegmental pulmonary emboli over the right lower lobe and minimally over the right middle lobe and upper lobes.  TTE with LVEF 60 to 65%, mild concentric LVH, normal diastolic parameters, no aortic stenosis, borderline dilation ascending aorta measuring 36 mm, IVC normal in size.  Vascular duplex was found bilateral lower extremities negative for DVT. -- Not a candidate for DOAC given Tegretol  use for bipolar disorder -Will discharge on Lovenox  100 mg subcu twice daily -- Outpatient follow-up with hematology/medical oncology   Hyperkalemia Replete   Hypomagnesemia: Resolved Replete   Lung adenocarcinoma with lymph node involvement, peritoneal metastasis Recently diagnosed.  Follows with medical oncology Dr. Sherrod outpatient.  Has missed multiple outpatient appointments.  CT angiogram chest/abdomen/pelvis with findings of stable 3 cm posterior medial left upper lobe lung mass concerning for primary  bronchogenic  neoplasm, multiple bilateral smaller pulm nodules without significant change compatible with metastatic disease, stable mediastinal and bilateral hilar adenopathy consistent with metastatic disease, extensive peritoneal spread of disease more prominent over the anterior mid to lower abdomen with slight interval progression of the peritoneal disease, small amount of free fluid, 1.5 cm hypodensity lower pole cortex right kidney new since prior exams.  -- Needs close outpatient follow-up with medical oncology, pulmonology   DM2 Hemoglobin A1c 5.8% on 01/31/2024, well-controlled.  At baseline on metformin  500 mg p.o. twice daily. -- Continue home regimen   HTN Chronic diastolic congestive heart failure -- Lisinopril  40 mg  p.o. daily -- -Continue amlodipine , Lasix  -Informed patient to check her blood pressure daily and call her PCP if blood pressure is below with systolic less than 100   Chronic hypoxic respiratory failure, stable COPD -- Breztri  2 puffs twice daily -- Albuterol  neb q4h PRN wheezing/shortness of breath -- Continue supplemental oxygen, and has been to greater than 88%, currently on baseline 2 L nasal cannula   Bipolar disorder -- Tegretol  400 mg PO BID   Cocaine use disorder Continues to endorse active cocaine use.  Counseled on need for complete cessation/abstinence         Consultants:  Procedures performed:  Disposition: Home Diet recommendation:  Regular diet DISCHARGE MEDICATION: Allergies as of 02/17/2024       Reactions   Gadolinium Derivatives Nausea And Vomiting, Other (See Comments)   Nausea and vomiting was followed by epileptic seizure episode that lasted approximately 5 minutes; pt was unable to verbally communicate during that time; she then came to and was able to speak; was evaluated by Rad and RN; kms    Grapefruit Flavor [flavoring Agent (non-screening)] Other (See Comments)   Drug interaction        Medication List     STOP taking these  medications    Bayer Back & Body Pain Ex St 500-32.5 MG Tabs Generic drug: Aspirin -Caffeine    Mucinex  Fast-Max Nght Cold/Flu 12.5-5-325 MG Tabs Generic drug: diphenhydrAMINE -PE-APAP       TAKE these medications    amLODipine  10 MG tablet Commonly known as: NORVASC  Take 10 mg by mouth daily.   Breztri  Aerosphere 160-9-4.8 MCG/ACT Aero inhaler Generic drug: budesonide -glycopyrrolate -formoterol  Inhale 2 puffs into the lungs in the morning and at bedtime.   carbamazepine  200 MG tablet Commonly known as: TEGRETOL  Take 2 tablets (400 mg total) by mouth 2 (two) times daily.   enoxaparin  120 MG/0.8ML injection Commonly known as: LOVENOX  Inject 0.8 mLs (120 mg total) into the skin every 12 (twelve) hours.   furosemide  80 MG tablet Commonly known as: LASIX  TAKE 1 TABLET BY MOUTH EVERY DAY   gabapentin  300 MG capsule Commonly known as: NEURONTIN  Take 2 capsules (600 mg total) by mouth 2 (two) times daily.   latanoprost  0.005 % ophthalmic solution Commonly known as: XALATAN  Place 1 drop into both eyes at bedtime.   lisinopril  30 MG tablet Commonly known as: ZESTRIL  Take 30 mg by mouth daily.   metFORMIN  500 MG tablet Commonly known as: GLUCOPHAGE  Take 500 mg by mouth in the morning and at bedtime.   Olopatadine  HCl 0.2 % Soln Place 1 drop into both eyes daily.   OXYGEN Inhale 2 L/min into the lungs continuous.   polyethylene glycol powder 17 GM/SCOOP powder Commonly known as: GLYCOLAX /MIRALAX  Take 17 g by mouth daily as needed. Dissolve 1 capful (17g) in 4-8 ounces of liquid and take by mouth daily.  potassium chloride  SA 20 MEQ tablet Commonly known as: KLOR-CON  M Take 1 tablet (20 mEq total) by mouth daily.   PRESCRIPTION MEDICATION CPAP - At bedtime and during any time of rest   Stool Softener/Laxative 50-8.6 MG tablet Generic drug: senna-docusate Take 1 tablet by mouth at bedtime as needed for mild constipation.   Ventolin  HFA 108 (90 Base) MCG/ACT  inhaler Generic drug: albuterol  inhale 2 PUFFS into THE lungs EVERY 6 HOURS AS NEEDED FOR WHEEZING OR SHORTNESS OF BREATH        Follow-up Information     Duwaine Annabella SAILOR, FNP Follow up in 2 week(s).   Specialty: Family Medicine Contact information: [address] [place] [address] [phone]         Sherrod Sherrod, MD. Schedule an appointment as soon as possible for a visit.   Specialty: Oncology Contact information: [address] [place] [address] [phone]                Discharge Exam: Fredricka Weights   02/15/24 1716  Weight: 122.3 kg   General-appears in no acute distress Heart-S1-S2, regular, no murmur auscultated Lungs-clear to auscultation bilaterally, no wheezing or crackles auscultated Abdomen-soft, nontender, no organomegaly Extremities-no edema in the lower extremities Neuro-alert, oriented x3, no focal deficit noted  Condition at discharge: good  The results of significant diagnostics from this hospitalization (including imaging, microbiology, ancillary and laboratory) are listed below for reference.   Imaging Studies: VAS US  LOWER EXTREMITY VENOUS (DVT) Result Date: 02/15/2024  Lower Venous DVT Study Patient Name:  JEORGIA HELMING  Date of Exam:   02/15/2024 Medical Rec #: [id number]                Accession #:    [id number] Date of Birth: 1970/12/29                Patient Gender: F Patient Age:   37 years Exam Location:  [facility] Procedure:      VAS US  LOWER EXTREMITY VENOUS (DVT) Referring Phys: MIR [facility] --------------------------------------------------------------------------------  Indications: Pulmonary embolism.  Comparison Study: Previous study on 1.8.2023 Performing Technologist: Edilia Elden Appl  Examination Guidelines: A complete evaluation includes B-mode imaging, spectral Doppler, color Doppler, and power Doppler as needed of all accessible portions of each vessel. Bilateral testing  is considered an integral part of a complete examination. Limited examinations for reoccurring indications may be performed as noted. The reflux portion of the exam is performed with the patient in reverse Trendelenburg.  +---------+---------------+---------+-----------+----------+-------------------+ RIGHT    CompressibilityPhasicitySpontaneityPropertiesThrombus Aging      +---------+---------------+---------+-----------+----------+-------------------+ CFV      Full           Yes      Yes                                      +---------+---------------+---------+-----------+----------+-------------------+ SFJ      Full           Yes      Yes                                      +---------+---------------+---------+-----------+----------+-------------------+ FV Prox  Full                                                             +---------+---------------+---------+-----------+----------+-------------------+  FV Mid   Full                                                             +---------+---------------+---------+-----------+----------+-------------------+ FV DistalFull                                                             +---------+---------------+---------+-----------+----------+-------------------+ PFV      Full                                                             +---------+---------------+---------+-----------+----------+-------------------+ POP      Full           No       Yes                                      +---------+---------------+---------+-----------+----------+-------------------+ PTV      Full                                                             +---------+---------------+---------+-----------+----------+-------------------+ PERO                                                  Not well                                                                  visualized, patent                                                         by color.           +---------+---------------+---------+-----------+----------+-------------------+   +---------+---------------+---------+-----------+----------+-------------------+ LEFT     CompressibilityPhasicitySpontaneityPropertiesThrombus Aging      +---------+---------------+---------+-----------+----------+-------------------+ CFV      Full           Yes      Yes                                      +---------+---------------+---------+-----------+----------+-------------------+ SFJ  Full           Yes      Yes                                      +---------+---------------+---------+-----------+----------+-------------------+ FV Prox  Full                                                             +---------+---------------+---------+-----------+----------+-------------------+ FV Mid   Full                                                             +---------+---------------+---------+-----------+----------+-------------------+ FV DistalFull                                                             +---------+---------------+---------+-----------+----------+-------------------+ PFV      Full                                                             +---------+---------------+---------+-----------+----------+-------------------+ POP      Full           No       Yes                                      +---------+---------------+---------+-----------+----------+-------------------+ PTV                                                   Not well                                                                  visualized, patent                                                        by color.           +---------+---------------+---------+-----------+----------+-------------------+ PERO  Not well                                                                   visualized, patent                                                        by color.           +---------+---------------+---------+-----------+----------+-------------------+     Summary: RIGHT: - There is no evidence of deep vein thrombosis in the lower extremity. However, portions of this examination were limited- see technologist comments above.  - No cystic structure found in the popliteal fossa.  LEFT: - There is no evidence of deep vein thrombosis in the lower extremity. However, portions of this examination were limited- see technologist comments above.  - No cystic structure found in the popliteal fossa.  *See table(s) above for measurements and observations. Electronically signed by Norman Serve on 02/15/2024 at 1:11:37 PM.    Final    ECHOCARDIOGRAM COMPLETE Result Date: 02/15/2024    ECHOCARDIOGRAM REPORT   Patient Name:   MARGARET COCKERILL Date of Exam: 02/15/2024 Medical Rec #:  [id number]               Height:       65.0 in Accession #:    [id number]              Weight:       266.8 lb Date of Birth:  [birth year]               BSA:          2.236 m Patient Age:    53 years                BP:           136/87 mmHg Patient Gender: F                       HR:           84 bpm. Exam Location:  Inpatient Procedure: 2D Echo, Cardiac Doppler, Color Doppler and Intracardiac            Opacification Agent (Both Spectral and Color Flow Doppler were            utilized during procedure). Indications:    CHF - Acute diastolic  History:        Patient has prior history of Echocardiogram examinations, most                 recent 01/18/2023.  Sonographer:    Odella Brewster Referring Phys: [id number] [facility]  Sonographer Comments: Technically difficult study due to poor echo windows and patient is obese. Image acquisition challenging due to patient body habitus, Image acquisition challenging due to respiratory motion and patient movement throughout exam. IMPRESSIONS  1.  Left ventricular ejection fraction, by estimation, is 60 to 65%. The left ventricle has normal function. The left ventricle has no regional wall motion abnormalities. There is mild concentric left ventricular hypertrophy. Left ventricular diastolic parameters were  normal.  2. Right ventricular systolic function is normal. The right ventricular size is normal. Tricuspid regurgitation signal is inadequate for assessing PA pressure.  3. The mitral valve is normal in structure. No evidence of mitral valve regurgitation. No evidence of mitral stenosis.  4. The aortic valve is normal in structure. Aortic valve regurgitation is not visualized. No aortic stenosis is present.  5. There is borderline dilatation of the ascending aorta, measuring 36 mm.  6. The inferior vena cava is normal in size with greater than 50% respiratory variability, suggesting right atrial pressure of 3 mmHg. FINDINGS  Left Ventricle: Left ventricular ejection fraction, by estimation, is 60 to 65%. The left ventricle has normal function. The left ventricle has no regional wall motion abnormalities. Definity  contrast agent was given IV to delineate the left ventricular  endocardial borders. The left ventricular internal cavity size was normal in size. There is mild concentric left ventricular hypertrophy. Left ventricular diastolic parameters were normal. Right Ventricle: The right ventricular size is normal. No increase in right ventricular wall thickness. Right ventricular systolic function is normal. Tricuspid regurgitation signal is inadequate for assessing PA pressure. Left Atrium: Left atrial size was normal in size. Right Atrium: Right atrial size was normal in size. Pericardium: There is no evidence of pericardial effusion. Mitral Valve: The mitral valve is normal in structure. No evidence of mitral valve regurgitation. No evidence of mitral valve stenosis. MV peak gradient, 5.7 mmHg. The mean mitral valve gradient is 2.0 mmHg. Tricuspid  Valve: The tricuspid valve is normal in structure. Tricuspid valve regurgitation is not demonstrated. No evidence of tricuspid stenosis. Aortic Valve: The aortic valve is normal in structure. Aortic valve regurgitation is not visualized. Aortic regurgitation PHT measures 661 msec. No aortic stenosis is present. Aortic valve mean gradient measures 5.0 mmHg. Aortic valve peak gradient measures 9.6 mmHg. Aortic valve area, by VTI measures 2.67 cm. Pulmonic Valve: The pulmonic valve was normal in structure. Pulmonic valve regurgitation is not visualized. No evidence of pulmonic stenosis. Aorta: The aortic root is normal in size and structure. There is borderline dilatation of the ascending aorta, measuring 36 mm. Venous: The inferior vena cava is normal in size with greater than 50% respiratory variability, suggesting right atrial pressure of 3 mmHg. IAS/Shunts: No atrial level shunt detected by color flow Doppler.  LEFT VENTRICLE PLAX 2D LVIDd:         4.50 cm     Diastology LVIDs:         3.00 cm     LV e' medial:    6.74 cm/s LV PW:         1.20 cm     LV E/e' medial:  13.1 LV IVS:        1.10 cm     LV e' lateral:   11.50 cm/s LVOT diam:     2.00 cm     LV E/e' lateral: 7.7 LV SV:         67 LV SV Index:   30 LVOT Area:     3.14 cm LV IVRT:       85 msec  LV Volumes (MOD) LV vol d, MOD A2C: 88.8 ml LV vol d, MOD A4C: 87.9 ml LV vol s, MOD A2C: 34.7 ml LV vol s, MOD A4C: 33.1 ml LV SV MOD A2C:     54.1 ml LV SV MOD A4C:     87.9 ml LV SV MOD BP:      54.6 ml RIGHT  VENTRICLE             IVC RV S prime:     12.70 cm/s  IVC diam: 1.70 cm TAPSE (M-mode): 1.9 cm                             PULMONARY VEINS                             Diastolic Velocity: 27.90 cm/s                             S/D Velocity:       1.40                             Systolic Velocity:  37.90 cm/s LEFT ATRIUM             Index        RIGHT ATRIUM           Index LA diam:        3.50 cm 1.57 cm/m   RA Area:     14.10 cm LA Vol (A2C):   41.1 ml  18.38 ml/m  RA Volume:   31.70 ml  14.18 ml/m LA Vol (A4C):   33.8 ml 15.12 ml/m LA Biplane Vol: 37.2 ml 16.64 ml/m  AORTIC VALVE                     PULMONIC VALVE AV Area (Vmax):    2.43 cm      PV Vmax:       1.03 m/s AV Area (Vmean):   2.56 cm      PV Peak grad:  4.2 mmHg AV Area (VTI):     2.67 cm AV Vmax:           155.00 cm/s AV Vmean:          106.000 cm/s AV VTI:            0.251 m AV Peak Grad:      9.6 mmHg AV Mean Grad:      5.0 mmHg LVOT Vmax:         120.00 cm/s LVOT Vmean:        86.400 cm/s LVOT VTI:          0.213 m LVOT/AV VTI ratio: 0.85 AI PHT:            661 msec  AORTA Ao Root diam: 3.40 cm Ao Asc diam:  3.60 cm MITRAL VALVE MV Area (PHT): 2.87 cm     SHUNTS MV Area VTI:   2.69 cm     Systemic VTI:  0.21 m MV Peak grad:  5.7 mmHg     Systemic Diam: 2.00 cm MV Mean grad:  2.0 mmHg MV Vmax:       1.19 m/s MV Vmean:      73.1 cm/s MV Decel Time: 264 msec MV E velocity: 88.60 cm/s MV A velocity: 104.00 cm/s MV E/A ratio:  0.85 Kardie Tobb DO Electronically signed by Dub Huntsman DO Signature Date/Time: 02/15/2024/10:59:55 AM    Final    CT Angio Chest/Abd/Pel for Dissection W and/or Wo Contrast Result Date: 02/14/2024 CLINICAL DATA:  Acute aortic syndrome suspected. Sudden onset chest pain and flank pain bilaterally last night and  this morning. Recent diagnosis of lung cancer. EXAM: CT ANGIOGRAPHY CHEST, ABDOMEN AND PELVIS TECHNIQUE: Non-contrast CT of the chest was initially obtained. Multidetector CT imaging through the chest, abdomen and pelvis was performed using the standard protocol during bolus administration of intravenous contrast. Multiplanar reconstructed images and MIPs were obtained and reviewed to evaluate the vascular anatomy. RADIATION DOSE REDUCTION: This exam was performed according to the departmental dose-optimization program which includes automated exposure control, adjustment of the mA and/or kV according to patient size and/or use of iterative reconstruction  technique. CONTRAST:  OMNIPAQUE  IOHEXOL  350 MG/ML SOLN COMPARISON:  01/31/2024, 11/30/2023 and CT abdomen 06/23/2023 FINDINGS: CTA CHEST FINDINGS Cardiovascular: Borderline stable cardiomegaly. Thoracic aorta is normal in caliber. Mild calcified plaque over the descending thoracic aorta. No evidence of aortic aneurysm or dissection. Pulmonary arterial system is well opacified and demonstrates multiple segmental and subsegmental emboli predominately over the right lower lobe and minimally over the right middle and upper lobes. No significant left-sided pulmonary emboli visualized. Remaining vascular structures are unremarkable. Mediastinum/Nodes: Stable 1.3 cm prevascular lymph node. 1.1 cm right paratracheal lymph node without significant change. 1.4 cm precarinal lymph node unchanged. 1.7 cm AP window lymph node without significant change. 1.3 cm subcarinal lymph node. Evidence of bilateral hilar adenopathy. Remaining mediastinal structures are unremarkable. Lungs/Pleura: Lungs are adequately inflated. No significant change in a posteromedial left upper lobe lung mass measuring 2 x 3 cm concerning for primary bronchogenic neoplasm. Numerous bilateral smaller pulmonary nodules without significant change compatible with metastatic disease. No acute airspace process. No significant effusion. Airways are unremarkable. Musculoskeletal: No focal abnormality. Review of the MIP images confirms the above findings. CTA ABDOMEN AND PELVIS FINDINGS VASCULAR Aorta: Mild calcified plaque over the abdominal aorta which is normal in caliber. Celiac: Moderate focal narrowing of the origin of the celiac axis with there is mild calcified plaque present. Celiac artery is otherwise patent. SMA: Patent without evidence of aneurysm, dissection, vasculitis or significant stenosis. Renals: Both renal arteries are patent without evidence of aneurysm, dissection, vasculitis, fibromuscular dysplasia or significant stenosis. IMA: Patent  without evidence of aneurysm, dissection, vasculitis or significant stenosis. Inflow: Patent without evidence of aneurysm, dissection, vasculitis or significant stenosis. Veins: No obvious venous abnormality within the limitations of this arterial phase study. Review of the MIP images confirms the above findings. NON-VASCULAR Hepatobiliary: Liver, gallbladder and biliary tree are normal. Pancreas: Normal. Spleen: Normal. Adrenals/Urinary Tract: Adrenal glands are normal. Kidneys are normal in size without hydronephrosis or nephrolithiasis. 1.5 cm hypodensity over the lower pole cortex right kidney with Hounsfield unit measurements of 38 new since the prior exams. This is indeterminate. Ureters and bladder are normal. Stomach/Bowel: Stomach and small bowel are normal. Appendix is normal. There is mild diverticulosis of the colon which is otherwise unremarkable. Lymphatic: No significant adenopathy. Reproductive: Stable calcified uterine fibroid. No adnexal regions are unremarkable. Other: Continued evidence of extensive peritoneal spread of disease more prominent over the anterior mid to lower abdomen with largest focus being a conglomerate mass over the anterior left lower quadrant measuring approximately 5 x 6.6 cm. Slight interval progression of this peritoneal disease. Small amount of free fluid in the pelvis. Musculoskeletal: No focal abnormality. Review of the MIP images confirms the above findings. IMPRESSION: 1. No evidence of aortic aneurysm or dissection. 2. Multiple segmental and subsegmental pulmonary emboli over the right lower lobe and minimally over the right middle and upper lobes. 3. 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 Numerous bilateral smaller pulmonary nodules without significant change compatible with metastatic disease. 4. Stable mediastinal and bilateral hilar adenopathy compatible with metastatic disease. 5. Continued evidence of  extensive peritoneal spread of disease more prominent over the anterior mid to lower abdomen with slight interval progression of this peritoneal disease. Small amount of free fluid in the pelvis. 6. 1.5 cm hypodensity over the lower pole cortex right kidney new since the prior exams. This is indeterminate. Recommend attention on follow-up. 7. Colonic diverticulosis. 8. Aortic atherosclerosis. Atherosclerotic coronary artery disease. Aortic Atherosclerosis (ICD10-I70.0). Critical Value/emergent results were called by telephone at the time of interpretation on 02/14/2024 at 10:05 am to provider JOSHUA LONG , who verbally acknowledged these results. Electronically Signed   By: Toribio Agreste M.D.   On: 02/14/2024 10:05   DG Chest Port 1 View Result Date: 02/02/2024 CLINICAL DATA:  Status post bronchoscopy EXAM: PORTABLE CHEST 1 VIEW COMPARISON:  January 30, 2024 FINDINGS: Stable cardiomegaly. Left suprahilar nodular density is again noted concerning for metastatic disease as noted on prior CT scan. Other pulmonary nodules are not well visualized on this radiograph. Bony thorax is unremarkable. IMPRESSION: Left suprahilar nodular density is again noted concerning for metastatic disease. No pneumothorax is noted. Electronically Signed   By: Lynwood Landy Raddle M.D.   On: 02/02/2024 14:26   DG C-ARM BRONCHOSCOPY Result Date: 02/02/2024 C-ARM BRONCHOSCOPY: Fluoroscopy was utilized by the requesting physician.  No radiographic interpretation.   DG C-Arm 1-60 Min-No Report Result Date: 02/02/2024 Fluoroscopy was utilized by the requesting physician.  No radiographic interpretation.   CT CHEST ABDOMEN PELVIS WO CONTRAST Result Date: 01/31/2024 EXAM: CT CHEST, ABDOMEN AND PELVIS WITHOUT CONTRAST 01/31/2024 01:39:32 AM TECHNIQUE: CT of the chest, abdomen and pelvis was performed without the administration of intravenous contrast. Multiplanar reformatted images are provided for review. Automated exposure control, iterative  reconstruction, and/or weight based adjustment of the mA/kV was utilized to reduce the radiation dose to as low as reasonably achievable. COMPARISON: None available. CLINICAL HISTORY: Respiratory illness, nondiagnostic xray. FINDINGS: CHEST: MEDIASTINUM AND LYMPH NODES: Heart and pericardium are unremarkable. The central airways are clear. Prevascular window adenopathy along with right paratracheal and subcarinal adenopathy again noted, unchanged. No axillary adenopathy. Difficult to assess the hila without intravenous contrast. LUNGS AND PLEURA: Posterior left upper lobe nodule measures 2.1 x 1.9 cm compared to 1.9 cm maximally on prior study. Numerous bilateral 5 to 10 mm pulmonary nodules on prior study and compatible with metastatic disease. No focal consolidation or pulmonary edema. No pleural effusion. No pneumothorax. ABDOMEN AND PELVIS: LIVER: Unremarkable. GALLBLADDER AND BILE DUCTS: Unremarkable. No biliary ductal dilatation. SPLEEN: No acute abnormality. PANCREAS: No acute abnormality. ADRENAL GLANDS: No acute abnormality. KIDNEYS, URETERS AND BLADDER: No stones in the kidneys or ureters. No hydronephrosis. No perinephric or periureteral stranding. Urinary bladder is unremarkable. GI AND BOWEL: Stomach demonstrates no acute abnormality. Scattered colonic diverticulosis. No active diverticulitis. There is no bowel obstruction. REPRODUCTIVE ORGANS: Calcified fibroid in the uterine fundus, stable. No adnexal mass. PERITONEUM AND RETROPERITONEUM: Nodularity in the omentum and peritoneum, worsening since prior study. Conglomerate omental mass in the left lower quadrant measures up to 6.4 cm. Stable 3 cm water density cystic structure in the retroperitoneum adjacent to the distal aorta and IVC compatible with a benign process. No ascites. No free air. VASCULATURE: Aorta is normal in caliber. Scattered aortic atherosclerosis. ABDOMINAL AND PELVIS LYMPH NODES: No lymphadenopathy. BONES AND SOFT TISSUES: No acute  osseous abnormality. No focal soft tissue abnormality.  IMPRESSION: 1. Posterior left upper lobe nodule measures 2.1 x 1.9 cm, increased from 1.9 cm on prior study. This may represent the primary malignancy. 2. Numerous bilateral 5 to 10 mm pulmonary nodules, compatible with metastatic disease. 3. Mediastinal adenopathy, unchanged. 4. Nodularity in the omentum and peritoneum, worsening since prior study, with a conglomerate omental mass in the left lower quadrant measuring up to 6.4 cm compatible with metastatic disease. Electronically signed by: Franky Crease MD 01/31/2024 02:02 AM EST RP Workstation: [id number]   DG Chest Port 1 View Result Date: 01/30/2024 EXAM: 1 VIEW(S) XRAY OF THE CHEST 01/30/2024 09:23:00 PM COMPARISON: Portable chest 01/10/2024. CTA chest 11/30/2023. CLINICAL HISTORY: dyspnea FINDINGS: LINES, TUBES AND DEVICES: Overlying monitor leads. LUNGS AND PLEURA: There are numerous pulmonary nodules throughout the lung fields. Findings consistent with widespread pulmonary metastases. This was better seen on CT as well as an approximately 3 cm left upper lobe presumed primary malignancy superimposing adjacent the aortic knob. There is soft tissue fullness along both hilar silhouettes and AP window, consistent with adenopathy. This was seen previously and may have slightly progressed. No focal pneumonia is evident. No pleural effusion. No pneumothorax. HEART AND MEDIASTINUM: Cardiomegaly is stable. No vascular congestion is seen. There is a mildly tortuous aorta with atherosclerosis. BONES AND SOFT TISSUES: No acute osseous abnormality. IMPRESSION: 1. Stable cardiomegaly without vascular congestion or focal pneumonia. 2. Widespread pulmonary metastases with approximately 3 cm left upper lobe presumed primary malignancy partially visible, and hilar/AP window adenopathy possibly slightly progressed. Electronically signed by: Francis Quam MD 01/30/2024 09:34 PM EST RP Workstation: [id number]     Microbiology: Results for orders placed or performed during the hospital encounter of 01/30/24  Resp panel by RT-PCR (RSV, Flu A&B, Covid) Anterior Nasal Swab     Status: None   Collection Time: 01/30/24  8:55 PM   Specimen: Anterior Nasal Swab  Result Value Ref Range Status   SARS Coronavirus 2 by RT PCR NEGATIVE NEGATIVE Final    Comment: (NOTE) SARS-CoV-2 target nucleic acids are NOT DETECTED.  The SARS-CoV-2 RNA is generally detectable in upper respiratory specimens during the acute phase of infection. The lowest concentration of SARS-CoV-2 viral copies this assay can detect is 138 copies/mL. A negative result does not preclude SARS-Cov-2 infection and should not be used as the sole basis for treatment or other patient management decisions. A negative result may occur with  improper specimen collection/handling, submission of specimen other than nasopharyngeal swab, presence of viral mutation(s) within the areas targeted by this assay, and inadequate number of viral copies(<138 copies/mL). A negative result must be combined with clinical observations, patient history, and epidemiological information. The expected result is Negative.  Fact Sheet for Patients:  bloggercourse.com  Fact Sheet for Healthcare Providers:  seriousbroker.it  This test is no t yet approved or cleared by the [place]  FDA and  has been authorized for detection and/or diagnosis of SARS-CoV-2 by FDA under an Emergency Use Authorization (EUA). This EUA will remain  in effect (meaning this test can be used) for the duration of the COVID-19 declaration under Section 564(b)(1) of the Act, 21 U.S.C.section 360bbb-3(b)(1), unless the authorization is terminated  or revoked sooner.       Influenza A by PCR NEGATIVE NEGATIVE Final   Influenza B by PCR NEGATIVE NEGATIVE Final    Comment: (NOTE) The Xpert Xpress SARS-CoV-2/FLU/RSV plus assay is  intended as an aid in the diagnosis of influenza from Nasopharyngeal swab specimens and should not be used as  a sole basis for treatment. Nasal washings and aspirates are unacceptable for Xpert Xpress SARS-CoV-2/FLU/RSV testing.  Fact Sheet for Patients: bloggercourse.com  Fact Sheet for Healthcare Providers: seriousbroker.it  This test is not yet approved or cleared by the [place]  FDA and has been authorized for detection and/or diagnosis of SARS-CoV-2 by FDA under an Emergency Use Authorization (EUA). This EUA will remain in effect (meaning this test can be used) for the duration of the COVID-19 declaration under Section 564(b)(1) of the Act, 21 U.S.C. section 360bbb-3(b)(1), unless the authorization is terminated or revoked.     Resp Syncytial Virus by PCR NEGATIVE NEGATIVE Final    Comment: (NOTE) Fact Sheet for Patients: bloggercourse.com  Fact Sheet for Healthcare Providers: seriousbroker.it  This test is not yet approved or cleared by the [place]  FDA and has been authorized for detection and/or diagnosis of SARS-CoV-2 by FDA under an Emergency Use Authorization (EUA). This EUA will remain in effect (meaning this test can be used) for the duration of the COVID-19 declaration under Section 564(b)(1) of the Act, 21 U.S.C. section 360bbb-3(b)(1), unless the authorization is terminated or revoked.  Performed at [facility], [address] [place], [place] [address]   MRSA Next Gen by PCR, Nasal     Status: None   Collection Time: 01/31/24  2:28 AM   Specimen: Nasal Mucosa; Nasal Swab  Result Value Ref Range Status   MRSA by PCR Next Gen NOT DETECTED NOT DETECTED Final    Comment: (NOTE) The GeneXpert MRSA Assay (FDA approved for NASAL specimens only), is one component of a comprehensive MRSA colonization surveillance program. It is not  intended to diagnose MRSA infection nor to guide or monitor treatment for MRSA infections. Test performance is not FDA approved in patients less than 57 years old. Performed at [facility], [address] [place], [place] [address]     Labs: CBC: Recent Labs  Lab 02/14/24 [phone] 02/14/24 0913 02/15/24 1402 02/16/24 0417 02/17/24 0415  WBC 11.9*  --  10.6* 7.8 7.8  HGB 14.5 14.6 13.2 12.0 11.9*  HCT 42.9 43.0 39.1 38.0 36.2  MCV 91.1  --  92.4 95.2 93.3  PLT 270  --  278 249 281   Basic Metabolic Panel: Recent Labs  Lab 02/14/24 0812 02/14/24 0913 02/15/24 1402 02/16/24 0417  NA 136 134* 138 138  K 4.1 5.8* 4.3 3.9  CL 98 98 101 103  CO2 25  --  24 25  GLUCOSE 112* 107* 122* 149*  BUN 5* 6 6 5*  CREATININE 0.63 0.70 0.61 0.60  CALCIUM  9.7  --  9.8 9.3  MG  --   --   --  2.1   Liver Function Tests: Recent Labs  Lab 02/14/24 0859  AST 23  ALT 14  ALKPHOS 165*  BILITOT 0.4  PROT 7.1  ALBUMIN 4.3   CBG: Recent Labs  Lab 02/16/24 1139 02/16/24 1619 02/16/24 2058 02/17/24 0718 02/17/24 1120  GLUCAP 118* 108* 123* 108* 115*    Discharge time spent: greater than 30 minutes.  Signed: Sabas GORMAN Brod, MD [facility] 02/17/2024 "

## 2024-02-17 NOTE — Plan of Care (Signed)
" °  Problem: Nutritional: Goal: Maintenance of adequate nutrition will improve Outcome: Progressing   Problem: Skin Integrity: Goal: Risk for impaired skin integrity will decrease Outcome: Progressing   Problem: Elimination: Goal: Will not experience complications related to bowel motility Outcome: Progressing   Problem: Elimination: Goal: Will not experience complications related to urinary retention Outcome: Progressing   Problem: Pain Managment: Goal: General experience of comfort will improve and/or be controlled Outcome: Progressing   "

## 2024-02-23 ENCOUNTER — Ambulatory Visit (HOSPITAL_COMMUNITY)

## 2024-02-23 ENCOUNTER — Ambulatory Visit (HOSPITAL_COMMUNITY): Admission: RE | Admit: 2024-02-23 | Source: Ambulatory Visit

## 2024-02-23 ENCOUNTER — Inpatient Hospital Stay

## 2024-02-23 ENCOUNTER — Inpatient Hospital Stay: Admitting: Internal Medicine

## 2024-02-27 ENCOUNTER — Inpatient Hospital Stay

## 2024-02-27 ENCOUNTER — Inpatient Hospital Stay: Admitting: Internal Medicine

## 2024-02-28 NOTE — Progress Notes (Signed)
 NN reached out to pt to make self-introduction. No answer, LVM with direct contact information as well as a reminder for her appt on 2/4 at 9:15 for labs and 10am to see Dr Sherrod. Per pt's DPR, no one else is to be contacted. NN will try again at a later time.

## 2024-02-29 ENCOUNTER — Emergency Department (HOSPITAL_COMMUNITY)

## 2024-02-29 ENCOUNTER — Emergency Department (HOSPITAL_COMMUNITY)
Admission: EM | Admit: 2024-02-29 | Discharge: 2024-02-29 | Disposition: A | Discharge status: Home / Self Care | Attending: Emergency Medicine | Admitting: Emergency Medicine

## 2024-02-29 ENCOUNTER — Inpatient Hospital Stay

## 2024-02-29 ENCOUNTER — Inpatient Hospital Stay: Admitting: Internal Medicine

## 2024-02-29 DIAGNOSIS — J449 Chronic obstructive pulmonary disease, unspecified: Secondary | ICD-10-CM | POA: Insufficient documentation

## 2024-02-29 DIAGNOSIS — C7802 Secondary malignant neoplasm of left lung: Secondary | ICD-10-CM | POA: Insufficient documentation

## 2024-02-29 DIAGNOSIS — I509 Heart failure, unspecified: Secondary | ICD-10-CM | POA: Insufficient documentation

## 2024-02-29 DIAGNOSIS — J189 Pneumonia, unspecified organism: Secondary | ICD-10-CM

## 2024-02-29 DIAGNOSIS — E119 Type 2 diabetes mellitus without complications: Secondary | ICD-10-CM | POA: Insufficient documentation

## 2024-02-29 DIAGNOSIS — J181 Lobar pneumonia, unspecified organism: Secondary | ICD-10-CM | POA: Insufficient documentation

## 2024-02-29 DIAGNOSIS — Z79899 Other long term (current) drug therapy: Secondary | ICD-10-CM | POA: Insufficient documentation

## 2024-02-29 DIAGNOSIS — C7801 Secondary malignant neoplasm of right lung: Secondary | ICD-10-CM | POA: Insufficient documentation

## 2024-02-29 DIAGNOSIS — Z7984 Long term (current) use of oral hypoglycemic drugs: Secondary | ICD-10-CM | POA: Insufficient documentation

## 2024-02-29 DIAGNOSIS — I110 Hypertensive heart disease with heart failure: Secondary | ICD-10-CM | POA: Insufficient documentation

## 2024-02-29 LAB — COMPREHENSIVE METABOLIC PANEL WITH GFR
ALT: 12 U/L (ref 0–44)
AST: 23 U/L (ref 15–41)
Albumin: 4.3 g/dL (ref 3.5–5.0)
Alkaline Phosphatase: 221 U/L — ABNORMAL HIGH (ref 38–126)
Anion gap: 12 (ref 5–15)
BUN: 7 mg/dL (ref 6–20)
CO2: 25 mmol/L (ref 22–32)
Calcium: 9.6 mg/dL (ref 8.9–10.3)
Chloride: 102 mmol/L (ref 98–111)
Creatinine, Ser: 0.6 mg/dL (ref 0.44–1.00)
GFR, Estimated: >60 mL/min
Glucose, Bld: 113 mg/dL — ABNORMAL HIGH (ref 70–99)
Potassium: 4.4 mmol/L (ref 3.5–5.1)
Sodium: 138 mmol/L (ref 135–145)
Total Bilirubin: 0.3 mg/dL (ref 0.0–1.2)
Total Protein: 7.2 g/dL (ref 6.5–8.1)

## 2024-02-29 LAB — CBC WITH DIFFERENTIAL/PLATELET
Abs Immature Granulocytes: 0.04 10*3/uL (ref 0.00–0.07)
Basophils Absolute: 0 10*3/uL (ref 0.0–0.1)
Basophils Relative: 0 %
Eosinophils Absolute: 0.2 10*3/uL (ref 0.0–0.5)
Eosinophils Relative: 2 %
HCT: 41.1 % (ref 36.0–46.0)
Hemoglobin: 13.8 g/dL (ref 12.0–15.0)
Immature Granulocytes: 0 %
Lymphocytes Relative: 25 %
Lymphs Abs: 2.5 10*3/uL (ref 0.7–4.0)
MCH: 30.9 pg (ref 26.0–34.0)
MCHC: 33.6 g/dL (ref 30.0–36.0)
MCV: 91.9 fL (ref 80.0–100.0)
Monocytes Absolute: 0.7 10*3/uL (ref 0.1–1.0)
Monocytes Relative: 7 %
Neutro Abs: 6.7 10*3/uL (ref 1.7–7.7)
Neutrophils Relative %: 66 %
Platelets: 464 10*3/uL — ABNORMAL HIGH (ref 150–400)
RBC: 4.47 MIL/uL (ref 3.87–5.11)
RDW: 13.8 % (ref 11.5–15.5)
WBC: 10.2 10*3/uL (ref 4.0–10.5)
nRBC: 0 % (ref 0.0–0.2)

## 2024-02-29 LAB — URINALYSIS, ROUTINE W REFLEX MICROSCOPIC
Bilirubin Urine: NEGATIVE
Glucose, UA: NEGATIVE mg/dL
Hgb urine dipstick: NEGATIVE
Ketones, ur: NEGATIVE mg/dL
Leukocytes,Ua: NEGATIVE
Nitrite: NEGATIVE
Protein, ur: NEGATIVE mg/dL
Specific Gravity, Urine: 1.039 — ABNORMAL HIGH (ref 1.005–1.030)
pH: 5 (ref 5.0–8.0)

## 2024-02-29 LAB — RESP PANEL BY RT-PCR (RSV, FLU A&B, COVID)  RVPGX2
Influenza A by PCR: NEGATIVE
Influenza B by PCR: NEGATIVE
Resp Syncytial Virus by PCR: NEGATIVE
SARS Coronavirus 2 by RT PCR: NEGATIVE

## 2024-02-29 LAB — LIPASE, BLOOD: Lipase: 12 U/L (ref 11–51)

## 2024-02-29 LAB — ETHANOL: Alcohol, Ethyl (B): <15 mg/dL

## 2024-02-29 MED ORDER — VANCOMYCIN HCL IN DEXTROSE 1-5 GM/200ML-% IV SOLN: 1000.0000 mg | Freq: Once | INTRAVENOUS | Status: DC

## 2024-02-29 MED ORDER — AZITHROMYCIN 250 MG PO TABS: ORAL_TABLET | ORAL | 0 refills | Status: AC

## 2024-02-29 MED ORDER — IOHEXOL 300 MG/ML  SOLN
100.0000 mL | Freq: Once | INTRAMUSCULAR | Status: AC | PRN
  Administered 2024-02-29: 100 mL via INTRAVENOUS

## 2024-02-29 MED ORDER — SODIUM CHLORIDE 0.9 % IV BOLUS
1000.0000 mL | Freq: Once | INTRAVENOUS | Status: AC
  Administered 2024-02-29: 1000 mL via INTRAVENOUS

## 2024-02-29 MED ORDER — VANCOMYCIN HCL 2000 MG/400ML IV SOLN
2000.0000 mg | Freq: Once | INTRAVENOUS | Status: AC
  Administered 2024-02-29: 2000 mg via INTRAVENOUS
  Filled 2024-02-29: qty 400

## 2024-02-29 MED ORDER — LORAZEPAM 2 MG/ML IJ SOLN
1.0000 mg | Freq: Once | INTRAMUSCULAR | Status: AC
  Administered 2024-02-29: 1 mg via INTRAVENOUS
  Filled 2024-02-29: qty 1

## 2024-02-29 MED ORDER — AMOXICILLIN-POT CLAVULANATE 875-125 MG PO TABS: 1.0000 | ORAL_TABLET | Freq: Two times a day (BID) | ORAL | 0 refills | Status: AC

## 2024-02-29 MED ORDER — SODIUM CHLORIDE 0.9 % IV SOLN
2.0000 g | Freq: Once | INTRAVENOUS | Status: AC
  Administered 2024-02-29: 2 g via INTRAVENOUS
  Filled 2024-02-29: qty 12.5

## 2024-02-29 NOTE — ED Provider Notes (Signed)
 " Denise [name] Provider Note   CSN: [id number] Arrival date & time: 02/29/24  9080     Patient presents with: Abdominal Pain, Nausea, and Dizziness   Denise [name] is a 54 y.o. female.   HPI Patient with multiple medical problems including adenocarcinoma, lung, PE, CHF, hypertension, diabetes, obesity presents with diffuse discomfort, abdominal pain, nausea, vomiting.  Patient discharged 1 week ago. She notes that over the past 2 days patient has had worsening symptoms, no new syncope, no current chest pain, abdominal pain is diffuse.  No dysuria.     Prior to Admission medications  Medication Sig Start Date End Date Taking? Authorizing Provider  amoxicillin -clavulanate (AUGMENTIN ) 875-125 MG tablet Take 1 tablet by mouth every 12 (twelve) hours. 02/29/24  Yes Garrick Charleston, MD  azithromycin  (ZITHROMAX  Z-PAK) 250 MG tablet Use as described on packaging, 500 mg, day 1, 250 mg on days 2, 3, 4, 5. 02/29/24  Yes Garrick Charleston, MD  amLODipine  (NORVASC ) 10 MG tablet Take 10 mg by mouth daily. 05/25/23   [provider]  budesonide -glycopyrrolate -formoterol  (BREZTRI  AEROSPHERE) 160-9-4.8 MCG/ACT AERO inhaler Inhale 2 puffs into the lungs in the morning and at bedtime. 10/11/23   Cobb, Comer GAILS, NP  carbamazepine  (TEGRETOL ) 200 MG tablet Take 2 tablets (400 mg total) by mouth 2 (two) times daily. 12/15/23   Skeet Juliene SAUNDERS, DO  enoxaparin  (LOVENOX ) 120 MG/0.8ML injection Inject 0.8 mLs (120 mg total) into the skin every 12 (twelve) hours. 02/17/24   Drusilla Sabas RAMAN, MD  furosemide  (LASIX ) 80 MG tablet TAKE 1 TABLET BY MOUTH EVERY DAY 11/10/23   Chandrasekhar, Mahesh A, MD  gabapentin  (NEURONTIN ) 300 MG capsule Take 2 capsules (600 mg total) by mouth 2 (two) times daily. 02/03/24   Cheryle Page, MD  latanoprost  (XALATAN ) 0.005 % ophthalmic solution Place 1 drop into both eyes at bedtime. 09/19/23 09/18/24  [provider]   lisinopril  (ZESTRIL ) 30 MG tablet Take 30 mg by mouth daily. 03/29/23   [provider]  metFORMIN  (GLUCOPHAGE ) 500 MG tablet Take 500 mg by mouth in the morning and at bedtime. 05/25/17   [provider]  Olopatadine  HCl 0.2 % SOLN Place 1 drop into both eyes daily. Patient not taking: Reported on 02/14/2024 09/19/23   [provider]  OXYGEN Inhale 2 L/min into the lungs continuous.    [provider]  polyethylene glycol powder (GLYCOLAX /MIRALAX ) 17 GM/SCOOP powder Take 17 g by mouth daily as needed. Dissolve 1 capful (17g) in 4-8 ounces of liquid and take by mouth daily. Patient not taking: Reported on 02/14/2024 02/03/24   Cheryle Page, MD  potassium chloride  SA (KLOR-CON  M) 20 MEQ tablet Take 1 tablet (20 mEq total) by mouth daily. 08/01/21   Jillian Buttery, MD  PRESCRIPTION MEDICATION CPAP - At bedtime and during any time of rest    [provider]  senna-docusate (SENOKOT-S) 8.6-50 MG tablet Take 1 tablet by mouth at bedtime as needed for mild constipation. 02/03/24   Cheryle Page, MD  VENTOLIN  HFA 108 (90 Base) MCG/ACT inhaler inhale 2 PUFFS into THE lungs EVERY 6 HOURS AS NEEDED FOR WHEEZING OR SHORTNESS OF BREATH Patient not taking: Reported on 02/14/2024 02/15/22   Sood, Vineet, MD    Allergies: Gadolinium derivatives and Grapefruit flavor [flavoring agent (non-screening)]    Review of Systems  Updated Vital Signs BP 120/72   Pulse 80   Temp 99 F (37.2 C) (Oral)   Resp 18  LMP 05/29/2017   SpO2 97%   Physical Exam Vitals and nursing note reviewed.  Constitutional:      Appearance: She is well-developed. She is obese. She is ill-appearing and diaphoretic.  HENT:     Head: Normocephalic and atraumatic.  Eyes:     Conjunctiva/sclera: Conjunctivae normal.  Cardiovascular:     Rate and Rhythm: Normal rate and regular rhythm.  Pulmonary:     Effort: Pulmonary effort is normal. No respiratory distress.     Breath sounds: Normal breath  sounds. No stridor.  Abdominal:     General: There is no distension.     Tenderness: There is generalized abdominal tenderness.  Skin:    General: Skin is warm.  Neurological:     Mental Status: She is alert and oriented to person, place, and time.     Cranial Nerves: No cranial nerve deficit.  Psychiatric:        Mood and Affect: Mood normal.     (all labs ordered are listed, but only abnormal results are displayed) Labs Reviewed  COMPREHENSIVE METABOLIC PANEL WITH GFR - Abnormal; Notable for the following components:      Result Value   Glucose, Bld 113 (*)    Alkaline Phosphatase 221 (*)    All other components within normal limits  CBC WITH DIFFERENTIAL/PLATELET - Abnormal; Notable for the following components:   Platelets 464 (*)    All other components within normal limits  URINALYSIS, ROUTINE W REFLEX MICROSCOPIC - Abnormal; Notable for the following components:   Specific Gravity, Urine 1.039 (*)    All other components within normal limits  RESP PANEL BY RT-PCR (RSV, FLU A&B, COVID)  RVPGX2  LIPASE, BLOOD  ETHANOL    EKG: EKG Interpretation Date/Time:  Wednesday February 29 2024 09:52:31 EST Ventricular Rate:  92 PR Interval:  142 QRS Duration:  94 QT Interval:  371 QTC Calculation: 459 R Axis:   30  Text Interpretation: Sinus rhythm Low voltage, precordial leads Borderline T abnormalities, anterior leads Confirmed by Garrick Charleston [phone]) on 02/29/2024 10:15:37 AM  Radiology: CT ABDOMEN PELVIS W CONTRAST Result Date: 02/29/2024 EXAM: CT ABDOMEN AND PELVIS WITH CONTRAST 02/29/2024 11:20:06 AM TECHNIQUE: CT of the abdomen and pelvis was performed with the administration of intravenous contrast. Multiplanar reformatted images are provided for review. Automated exposure control, iterative reconstruction, and/or weight-based adjustment of the mA/kV was utilized to reduce the radiation dose to as low as reasonably achievable. 100 mL of iohexol  (OMNIPAQUE ) 300 MG/ML  solution was administered intravenously. COMPARISON: CT chest, abdomen, and pelvis 02/14/2024. CLINICAL HISTORY: 54 year old female. Abdominal pain, acute, nonlocalized. Dizziness and weakness. Recent diagnosis of lung cancer. FINDINGS: LOWER CHEST: Numerous metastatic appearing bilateral lung base nodules. These are not significantly changed in size or configuration since last month. No pericardial or pleural effusion. Right lower lobe pulmonary emboli better demonstrated last month. No lower mediastinal lymphadenopathy. No destructive osseous lesion. LIVER: The liver is unremarkable. GALLBLADDER AND BILE DUCTS: Gallbladder is unremarkable. No biliary ductal dilatation. SPLEEN: Subtle linear, wedge shaped and peripheral hypodensities in the medial spleen (series 2 image 13) appear associated with subtle splenic volume loss also and are most compatible with prior small splenic infarcts. There is no perisplenic inflammation or fluid. PANCREAS: No acute abnormality. ADRENAL GLANDS: No acute abnormality. KIDNEYS, URETERS AND BLADDER: No stones in the kidneys or ureters. No hydronephrosis. No perinephric or periureteral stranding. Urinary bladder is unremarkable. GI AND BOWEL: Stomach demonstrates no acute abnormality. Diverticulosis of the large bowel  is most pronounced in the sigmoid colon. No dilated or obstructed bowel loops. PERITONEUM AND RETROPERITONEUM: Confluent metastatic disease in the omentum (omental caking including on series 2 image 60) has not significantly changed from last month. There is trace simple density free fluid in the pelvis most visible on coronal image 98. No free air. VASCULATURE: Aorta is normal in caliber. Calcified aortic and pelvic atherosclerosis. Major arterial structures in the abdomen and pelvis and the portal venous system appear patent. LYMPH NODES: No superimposed lymphadenopathy in the abdomen or pelvis. There is a chronic simple fluid density cystic lesion in the  retroperitoneum near the aortoiliac bifurcation which has been present since at least 2019 and appears unrelated to the malignancy (series 2 image 54). REPRODUCTIVE ORGANS: Stable calcified anterior uterine fibroid. BONES AND SOFT TISSUES: No destructive osseous lesion. IMPRESSION: 1. Extensive lung and omental metastases are not significantly changed from last month. Trace free fluid in the pelvis, likely reactive. 2. Evidence of small previous splenic infarcts. 3. No other acute or inflammatory process identified in the abdomen or pelvis. Aortic Atherosclerosis (ICD10-I70.0). Electronically signed by: Helayne Hurst MD 02/29/2024 11:46 AM EST RP Workstation: [id number]   DG Chest 2 View Result Date: 02/29/2024 CLINICAL DATA:  Shortness of breath, weakness EXAM: CHEST - 2 VIEW COMPARISON:  February 02, 2024.  February 14, 2024. FINDINGS: Stable cardiomegaly. Left lung is clear. Right lower lobe opacity is noted concerning for possible pneumonia or atelectasis. Bony thorax is unremarkable. IMPRESSION: Right lower lobe opacity is noted concerning for possible pneumonia or atelectasis. Electronically Signed   By: Lynwood Landy Raddle M.D.   On: 02/29/2024 10:50     Procedures   Medications Ordered in the ED  vancomycin  (VANCOREADY) IVPB 2000 mg/400 mL (2,000 mg Intravenous New Bag/Given 02/29/24 1231)  sodium chloride  0.9 % bolus 1,000 mL (1,000 mLs Intravenous New Bag/Given 02/29/24 1030)  LORazepam  (ATIVAN ) injection 1 mg (1 mg Intravenous Given 02/29/24 1120)  iohexol  (OMNIPAQUE ) 300 MG/ML solution 100 mL (100 mLs Intravenous Contrast Given 02/29/24 1103)  ceFEPIme  (MAXIPIME ) 2 g in sodium chloride  0.9 % 100 mL IVPB (0 g Intravenous Stopped 02/29/24 1155)                                    Medical Decision Making Adult female multiple medical issues, cancer, PE, COPD, hypertension, diabetes, obesity, concern for carcinomatosis substantial risk profile for pneumonia, bacteremia, sepsis, intra-abdominal processes  presents with abdominal pain nausea, vomiting, dyspnea.  She reportedly has been compliant with her medications.  However, broad differential including progression of disease, PE, pneumonia, intra-abdominal process. Initial vitals: Cardiac 90 sinus normal pulse ox 99% room air normal  Amount and/or Complexity of Data Reviewed Independent Historian: EMS External Data Reviewed: notes.    Details: Discharge summary from last week reviewed Labs: ordered. Decision-making details documented in ED Course. Radiology: ordered and independent interpretation performed. Decision-making details documented in ED Course.    Details: CT last week included below, CT abdomen pelvis ordered again today chest x-ray as well ECG/medicine tests: ordered and independent interpretation performed. Decision-making details documented in ED Course.  Risk Prescription drug management. Decision regarding hospitalization. Diagnosis or treatment significantly limited by social determinants of health.  CT chest abdomen pelvis January 20: IMPRESSION: 1. No evidence of aortic aneurysm or dissection. 2. Multiple segmental and subsegmental pulmonary emboli over the right lower lobe and minimally over the right middle and upper  lobes. 3. Stable 3 cm posteromedial left upper lobe lung mass concerning for primary bronchogenic neoplasm. Numerous bilateral smaller pulmonary nodules without significant change compatible with metastatic disease. 4. Stable mediastinal and bilateral hilar adenopathy compatible with metastatic disease. 5. Continued evidence of extensive peritoneal spread of disease more prominent over the anterior mid to lower abdomen with slight interval progression of this peritoneal disease. Small amount of free fluid in the pelvis. 6. 1.5 cm hypodensity over the lower pole cortex right kidney new since the prior exams. This is indeterminate. Recommend attention on follow-up. 7. Colonic diverticulosis. 8.  Aortic atherosclerosis. Atherosclerotic coronary artery disease.   Update:  Patient with evidence for pneumonia.  Given recent hospitalization she qualifies for healthcare acquired coverage, broad-spectrum antibiotics started. Patient will continue anticoagulation given her recent PE diagnosis.  2:09 PM Patient feeling markedly better. Initial x-ray with possible pneumonia, though this may be atelectasis given the absence of other corroborating findings, leukocytosis, lactic acidosis, fever.  Patient will continue taking her anticoagulation given recent pulmonary embolism.  With no hemodynamic instability, improved clinical condition, patient is appropriate for close outpatient follow-up with her oncology team to initiate therapy for her newly diagnosed cancer.     Final diagnoses:  Community acquired pneumonia of right lower lobe of lung  Malignant neoplasm metastatic to both lungs [facility])    ED Discharge Orders          Ordered    azithromycin  (ZITHROMAX  Z-PAK) 250 MG tablet        02/29/24 1409    amoxicillin -clavulanate (AUGMENTIN ) 875-125 MG tablet  Every 12 hours        02/29/24 1409               Garrick Charleston, MD 02/29/24 1410  "

## 2024-02-29 NOTE — ED Notes (Signed)
 [facility] canceled family picking her up

## 2024-02-29 NOTE — ED Triage Notes (Signed)
 Patient BIBA coming from home c/o dizziness/weakness/n/v/abdominal pain. HX COPD/HTM/DM2 vs 160/90 96% 2L which is her baseline. Patient is alert and oriented x 4. Airway patent, respirations even and unlabored. Skin normal, warm and dry.

## 2024-02-29 NOTE — Discharge Instructions (Signed)
 Be sure to follow-up with your oncology team via telephone tomorrow.  Today's evaluation has been generally reassuring, consistent with prior studies.  However, there is some evidence for pneumonia.  Continue taking your anticoagulation medicine as well as the prescribed antibiotics.  Return here for concerning changes in your condition.

## 2024-02-29 NOTE — ED Notes (Addendum)
 [facility] called

## 2024-04-13 ENCOUNTER — Ambulatory Visit: Admitting: Podiatry

## 2024-07-17 ENCOUNTER — Ambulatory Visit: Admitting: Neurology
